# Patient Record
Sex: Female | Born: 1935 | Race: White | Hispanic: No | Smoking: Former smoker
Health system: Southern US, Community
[De-identification: ages and names within clinical notes are randomized; demographics above are authoritative.]

## PROBLEM LIST (undated history)

## (undated) DIAGNOSIS — F419 Anxiety disorder, unspecified: Secondary | ICD-10-CM

## (undated) DIAGNOSIS — F32A Depression, unspecified: Secondary | ICD-10-CM

## (undated) DIAGNOSIS — J449 Chronic obstructive pulmonary disease, unspecified: Secondary | ICD-10-CM

## (undated) DIAGNOSIS — E7800 Pure hypercholesterolemia, unspecified: Secondary | ICD-10-CM

## (undated) DIAGNOSIS — H8110 Benign paroxysmal vertigo, unspecified ear: Secondary | ICD-10-CM

## (undated) DIAGNOSIS — I6522 Occlusion and stenosis of left carotid artery: Secondary | ICD-10-CM

## (undated) DIAGNOSIS — G4489 Other headache syndrome: Secondary | ICD-10-CM

## (undated) DIAGNOSIS — E079 Disorder of thyroid, unspecified: Secondary | ICD-10-CM

## (undated) DIAGNOSIS — E559 Vitamin D deficiency, unspecified: Secondary | ICD-10-CM

## (undated) DIAGNOSIS — F329 Major depressive disorder, single episode, unspecified: Secondary | ICD-10-CM

## (undated) DIAGNOSIS — E039 Hypothyroidism, unspecified: Secondary | ICD-10-CM

## (undated) DIAGNOSIS — K219 Gastro-esophageal reflux disease without esophagitis: Secondary | ICD-10-CM

## (undated) DIAGNOSIS — R251 Tremor, unspecified: Secondary | ICD-10-CM

## (undated) DIAGNOSIS — C801 Malignant (primary) neoplasm, unspecified: Secondary | ICD-10-CM

## (undated) DIAGNOSIS — M069 Rheumatoid arthritis, unspecified: Secondary | ICD-10-CM

## (undated) DIAGNOSIS — J189 Pneumonia, unspecified organism: Secondary | ICD-10-CM

## (undated) DIAGNOSIS — N814 Uterovaginal prolapse, unspecified: Secondary | ICD-10-CM

## (undated) DIAGNOSIS — R413 Other amnesia: Secondary | ICD-10-CM

## (undated) DIAGNOSIS — R7303 Prediabetes: Secondary | ICD-10-CM

## (undated) DIAGNOSIS — R0600 Dyspnea, unspecified: Secondary | ICD-10-CM

## (undated) DIAGNOSIS — M1990 Unspecified osteoarthritis, unspecified site: Secondary | ICD-10-CM

## (undated) HISTORY — DX: Other headache syndrome: G44.89

## (undated) HISTORY — PX: EYE SURGERY: SHX253

## (undated) HISTORY — DX: Chronic obstructive pulmonary disease, unspecified: J44.9

## (undated) HISTORY — PX: APPENDECTOMY: SHX54

## (undated) HISTORY — PX: HEMORRHOID SURGERY: SHX153

## (undated) HISTORY — DX: Benign paroxysmal vertigo, unspecified ear: H81.10

## (undated) HISTORY — PX: OTHER SURGICAL HISTORY: SHX169

## (undated) HISTORY — DX: Vitamin D deficiency, unspecified: E55.9

## (undated) HISTORY — DX: Rheumatoid arthritis, unspecified: M06.9

## (undated) HISTORY — DX: Uterovaginal prolapse, unspecified: N81.4

## (undated) HISTORY — DX: Dyspnea, unspecified: R06.00

## (undated) HISTORY — DX: Other amnesia: R41.3

## (undated) HISTORY — PX: COLONOSCOPY W/ POLYPECTOMY: SHX1380

## (undated) HISTORY — PX: TONSILLECTOMY AND ADENOIDECTOMY: SHX28

## (undated) HISTORY — DX: Tremor, unspecified: R25.1

## (undated) HISTORY — DX: Occlusion and stenosis of left carotid artery: I65.22

## (undated) HISTORY — PX: FOOT SURGERY: SHX648

---

## 1999-01-03 ENCOUNTER — Encounter: Payer: Self-pay | Admitting: Family Medicine

## 1999-01-03 ENCOUNTER — Ambulatory Visit (HOSPITAL_COMMUNITY): Admission: RE | Admit: 1999-01-03 | Discharge: 1999-01-03 | Payer: Self-pay | Admitting: Family Medicine

## 1999-04-16 ENCOUNTER — Encounter: Payer: Self-pay | Admitting: Family Medicine

## 1999-04-16 ENCOUNTER — Ambulatory Visit (HOSPITAL_COMMUNITY): Admission: RE | Admit: 1999-04-16 | Discharge: 1999-04-16 | Payer: Self-pay | Admitting: Family Medicine

## 1999-12-05 ENCOUNTER — Other Ambulatory Visit: Admission: RE | Admit: 1999-12-05 | Discharge: 1999-12-05 | Payer: Self-pay | Admitting: Family Medicine

## 2000-02-13 ENCOUNTER — Encounter: Admission: RE | Admit: 2000-02-13 | Discharge: 2000-02-13 | Payer: Self-pay | Admitting: Gastroenterology

## 2000-02-13 ENCOUNTER — Encounter: Payer: Self-pay | Admitting: Gastroenterology

## 2000-05-21 ENCOUNTER — Encounter: Admission: RE | Admit: 2000-05-21 | Discharge: 2000-05-21 | Payer: Self-pay

## 2000-12-09 ENCOUNTER — Other Ambulatory Visit: Admission: RE | Admit: 2000-12-09 | Discharge: 2000-12-09 | Payer: Self-pay | Admitting: Internal Medicine

## 2001-02-24 ENCOUNTER — Encounter: Admission: RE | Admit: 2001-02-24 | Discharge: 2001-02-24 | Payer: Self-pay | Admitting: Rheumatology

## 2001-02-24 ENCOUNTER — Encounter: Payer: Self-pay | Admitting: Rheumatology

## 2002-08-09 ENCOUNTER — Encounter: Payer: Self-pay | Admitting: Family Medicine

## 2002-08-09 ENCOUNTER — Ambulatory Visit (HOSPITAL_COMMUNITY): Admission: RE | Admit: 2002-08-09 | Discharge: 2002-08-09 | Payer: Self-pay | Admitting: Family Medicine

## 2002-10-18 ENCOUNTER — Other Ambulatory Visit: Admission: RE | Admit: 2002-10-18 | Discharge: 2002-10-18 | Payer: Self-pay | Admitting: Gynecology

## 2004-01-11 ENCOUNTER — Ambulatory Visit (HOSPITAL_COMMUNITY): Admission: RE | Admit: 2004-01-11 | Discharge: 2004-01-11 | Payer: Self-pay | Admitting: Family Medicine

## 2004-01-14 ENCOUNTER — Other Ambulatory Visit: Admission: RE | Admit: 2004-01-14 | Discharge: 2004-01-14 | Payer: Self-pay | Admitting: Gynecology

## 2004-10-09 ENCOUNTER — Encounter: Admission: RE | Admit: 2004-10-09 | Discharge: 2004-10-09 | Payer: Self-pay | Admitting: Rheumatology

## 2004-10-21 ENCOUNTER — Ambulatory Visit: Admission: RE | Admit: 2004-10-21 | Discharge: 2004-10-21 | Payer: Self-pay | Admitting: Internal Medicine

## 2004-10-21 ENCOUNTER — Encounter (INDEPENDENT_AMBULATORY_CARE_PROVIDER_SITE_OTHER): Payer: Self-pay | Admitting: Specialist

## 2004-10-21 ENCOUNTER — Encounter (INDEPENDENT_AMBULATORY_CARE_PROVIDER_SITE_OTHER): Payer: Self-pay | Admitting: *Deleted

## 2004-10-28 HISTORY — PX: OTHER SURGICAL HISTORY: SHX169

## 2004-10-29 ENCOUNTER — Ambulatory Visit: Payer: Self-pay | Admitting: Thoracic Surgery

## 2004-10-30 ENCOUNTER — Ambulatory Visit (HOSPITAL_COMMUNITY): Admission: RE | Admit: 2004-10-30 | Discharge: 2004-10-30 | Payer: Self-pay | Admitting: Thoracic Surgery

## 2004-11-05 ENCOUNTER — Ambulatory Visit (HOSPITAL_COMMUNITY): Admission: RE | Admit: 2004-11-05 | Discharge: 2004-11-05 | Payer: Self-pay | Admitting: Thoracic Surgery

## 2004-11-10 ENCOUNTER — Inpatient Hospital Stay (HOSPITAL_COMMUNITY): Admission: RE | Admit: 2004-11-10 | Discharge: 2004-11-18 | Payer: Self-pay | Admitting: Thoracic Surgery

## 2004-11-10 ENCOUNTER — Ambulatory Visit: Payer: Self-pay | Admitting: Pulmonary Disease

## 2004-11-10 ENCOUNTER — Encounter (INDEPENDENT_AMBULATORY_CARE_PROVIDER_SITE_OTHER): Payer: Self-pay | Admitting: *Deleted

## 2004-11-12 ENCOUNTER — Ambulatory Visit: Payer: Self-pay | Admitting: Internal Medicine

## 2004-11-22 ENCOUNTER — Emergency Department (HOSPITAL_COMMUNITY): Admission: EM | Admit: 2004-11-22 | Discharge: 2004-11-22 | Payer: Self-pay | Admitting: Emergency Medicine

## 2004-11-26 ENCOUNTER — Encounter: Admission: RE | Admit: 2004-11-26 | Discharge: 2004-11-26 | Payer: Self-pay | Admitting: Thoracic Surgery

## 2004-12-10 ENCOUNTER — Encounter: Admission: RE | Admit: 2004-12-10 | Discharge: 2004-12-10 | Payer: Self-pay | Admitting: Thoracic Surgery

## 2005-01-01 ENCOUNTER — Ambulatory Visit: Payer: Self-pay | Admitting: Internal Medicine

## 2005-01-07 ENCOUNTER — Encounter: Admission: RE | Admit: 2005-01-07 | Discharge: 2005-01-07 | Payer: Self-pay | Admitting: Thoracic Surgery

## 2005-02-03 ENCOUNTER — Ambulatory Visit (HOSPITAL_COMMUNITY): Admission: RE | Admit: 2005-02-03 | Discharge: 2005-02-03 | Payer: Self-pay | Admitting: Internal Medicine

## 2005-02-12 ENCOUNTER — Ambulatory Visit: Payer: Self-pay | Admitting: Internal Medicine

## 2005-02-13 ENCOUNTER — Ambulatory Visit (HOSPITAL_COMMUNITY): Admission: RE | Admit: 2005-02-13 | Discharge: 2005-02-13 | Payer: Self-pay | Admitting: Internal Medicine

## 2005-03-03 ENCOUNTER — Ambulatory Visit (HOSPITAL_COMMUNITY): Admission: RE | Admit: 2005-03-03 | Discharge: 2005-03-03 | Payer: Self-pay | Admitting: Internal Medicine

## 2005-03-09 ENCOUNTER — Ambulatory Visit: Payer: Self-pay | Admitting: Internal Medicine

## 2005-03-11 ENCOUNTER — Encounter: Admission: RE | Admit: 2005-03-11 | Discharge: 2005-03-11 | Payer: Self-pay | Admitting: Thoracic Surgery

## 2005-03-24 ENCOUNTER — Ambulatory Visit: Payer: Self-pay | Admitting: Internal Medicine

## 2005-04-02 ENCOUNTER — Encounter: Admission: RE | Admit: 2005-04-02 | Discharge: 2005-04-02 | Payer: Self-pay | Admitting: Gastroenterology

## 2005-04-16 ENCOUNTER — Ambulatory Visit (HOSPITAL_COMMUNITY): Admission: RE | Admit: 2005-04-16 | Discharge: 2005-04-16 | Payer: Self-pay | Admitting: Internal Medicine

## 2005-04-22 ENCOUNTER — Encounter: Admission: RE | Admit: 2005-04-22 | Discharge: 2005-04-22 | Payer: Self-pay | Admitting: Gastroenterology

## 2005-05-12 ENCOUNTER — Other Ambulatory Visit: Admission: RE | Admit: 2005-05-12 | Discharge: 2005-05-12 | Payer: Self-pay | Admitting: Gynecology

## 2005-06-10 ENCOUNTER — Encounter: Admission: RE | Admit: 2005-06-10 | Discharge: 2005-06-10 | Payer: Self-pay | Admitting: Thoracic Surgery

## 2005-07-21 ENCOUNTER — Ambulatory Visit: Payer: Self-pay | Admitting: Internal Medicine

## 2005-07-24 ENCOUNTER — Ambulatory Visit (HOSPITAL_COMMUNITY): Admission: RE | Admit: 2005-07-24 | Discharge: 2005-07-24 | Payer: Self-pay | Admitting: Internal Medicine

## 2005-08-17 ENCOUNTER — Encounter: Admission: RE | Admit: 2005-08-17 | Discharge: 2005-08-17 | Payer: Self-pay | Admitting: Rheumatology

## 2005-10-13 ENCOUNTER — Encounter: Admission: RE | Admit: 2005-10-13 | Discharge: 2005-10-13 | Payer: Self-pay | Admitting: Thoracic Surgery

## 2005-11-12 ENCOUNTER — Ambulatory Visit: Payer: Self-pay | Admitting: Internal Medicine

## 2005-11-16 ENCOUNTER — Ambulatory Visit (HOSPITAL_COMMUNITY): Admission: RE | Admit: 2005-11-16 | Discharge: 2005-11-16 | Payer: Self-pay | Admitting: Internal Medicine

## 2005-12-15 ENCOUNTER — Ambulatory Visit: Payer: Self-pay | Admitting: Internal Medicine

## 2006-01-19 ENCOUNTER — Ambulatory Visit: Payer: Self-pay | Admitting: Internal Medicine

## 2006-01-19 ENCOUNTER — Ambulatory Visit (HOSPITAL_COMMUNITY): Admission: RE | Admit: 2006-01-19 | Discharge: 2006-01-19 | Payer: Self-pay | Admitting: Internal Medicine

## 2006-02-19 ENCOUNTER — Ambulatory Visit (HOSPITAL_COMMUNITY): Admission: RE | Admit: 2006-02-19 | Discharge: 2006-02-19 | Payer: Self-pay | Admitting: Internal Medicine

## 2006-03-05 ENCOUNTER — Ambulatory Visit: Payer: Self-pay | Admitting: Internal Medicine

## 2006-03-05 LAB — PULMONARY FUNCTION TEST

## 2006-04-14 ENCOUNTER — Encounter: Admission: RE | Admit: 2006-04-14 | Discharge: 2006-04-14 | Payer: Self-pay | Admitting: Thoracic Surgery

## 2006-06-22 ENCOUNTER — Ambulatory Visit: Payer: Self-pay | Admitting: Internal Medicine

## 2006-08-12 ENCOUNTER — Ambulatory Visit: Payer: Self-pay | Admitting: Internal Medicine

## 2006-08-17 LAB — CBC WITH DIFFERENTIAL/PLATELET
BASO%: 1.1 % (ref 0.0–2.0)
Basophils Absolute: 0.1 10*3/uL (ref 0.0–0.1)
EOS%: 1.5 % (ref 0.0–7.0)
Eosinophils Absolute: 0.1 10*3/uL (ref 0.0–0.5)
HCT: 34.9 % (ref 34.8–46.6)
HGB: 12 g/dL (ref 11.6–15.9)
LYMPH%: 55 % — ABNORMAL HIGH (ref 14.0–48.0)
MCH: 33.8 pg (ref 26.0–34.0)
MCHC: 34.4 g/dL (ref 32.0–36.0)
MCV: 98.4 fL (ref 81.0–101.0)
MONO#: 0.5 10*3/uL (ref 0.1–0.9)
MONO%: 7.5 % (ref 0.0–13.0)
NEUT#: 2.5 10*3/uL (ref 1.5–6.5)
NEUT%: 34.9 % — ABNORMAL LOW (ref 39.6–76.8)
Platelets: 264 10*3/uL (ref 145–400)
RBC: 3.55 10*6/uL — ABNORMAL LOW (ref 3.70–5.32)
RDW: 14.1 % (ref 11.3–14.5)
WBC: 7.2 10*3/uL (ref 3.9–10.0)
lymph#: 4 10*3/uL — ABNORMAL HIGH (ref 0.9–3.3)

## 2006-08-17 LAB — COMPREHENSIVE METABOLIC PANEL
ALT: 11 U/L (ref 0–40)
AST: 19 U/L (ref 0–37)
Albumin: 3.9 g/dL (ref 3.5–5.2)
Alkaline Phosphatase: 78 U/L (ref 39–117)
BUN: 11 mg/dL (ref 6–23)
CO2: 26 mEq/L (ref 19–32)
Calcium: 9.1 mg/dL (ref 8.4–10.5)
Chloride: 103 mEq/L (ref 96–112)
Creatinine, Ser: 0.87 mg/dL (ref 0.40–1.20)
Glucose, Bld: 85 mg/dL (ref 70–99)
Potassium: 3.7 mEq/L (ref 3.5–5.3)
Sodium: 141 mEq/L (ref 135–145)
Total Bilirubin: 0.4 mg/dL (ref 0.3–1.2)
Total Protein: 7.2 g/dL (ref 6.0–8.3)

## 2006-08-19 ENCOUNTER — Ambulatory Visit (HOSPITAL_COMMUNITY): Admission: RE | Admit: 2006-08-19 | Discharge: 2006-08-19 | Payer: Self-pay | Admitting: Internal Medicine

## 2006-10-13 ENCOUNTER — Encounter: Admission: RE | Admit: 2006-10-13 | Discharge: 2006-10-13 | Payer: Self-pay | Admitting: Thoracic Surgery

## 2006-12-01 ENCOUNTER — Encounter: Admission: RE | Admit: 2006-12-01 | Discharge: 2006-12-01 | Payer: Self-pay | Admitting: Rheumatology

## 2006-12-30 ENCOUNTER — Other Ambulatory Visit: Admission: RE | Admit: 2006-12-30 | Discharge: 2006-12-30 | Payer: Self-pay | Admitting: Gynecology

## 2007-01-27 ENCOUNTER — Encounter: Admission: RE | Admit: 2007-01-27 | Discharge: 2007-01-27 | Payer: Self-pay | Admitting: Rheumatology

## 2007-02-11 ENCOUNTER — Ambulatory Visit: Payer: Self-pay | Admitting: Internal Medicine

## 2007-02-16 LAB — CBC WITH DIFFERENTIAL/PLATELET
BASO%: 0.8 % (ref 0.0–2.0)
Basophils Absolute: 0.1 10*3/uL (ref 0.0–0.1)
EOS%: 1.7 % (ref 0.0–7.0)
Eosinophils Absolute: 0.1 10*3/uL (ref 0.0–0.5)
HCT: 35.9 % (ref 34.8–46.6)
HGB: 12.4 g/dL (ref 11.6–15.9)
LYMPH%: 44.9 % (ref 14.0–48.0)
MCH: 33.8 pg (ref 26.0–34.0)
MCHC: 34.5 g/dL (ref 32.0–36.0)
MCV: 98.1 fL (ref 81.0–101.0)
MONO#: 0.7 10*3/uL (ref 0.1–0.9)
MONO%: 8.6 % (ref 0.0–13.0)
NEUT#: 3.6 10*3/uL (ref 1.5–6.5)
NEUT%: 44 % (ref 39.6–76.8)
Platelets: 272 10*3/uL (ref 145–400)
RBC: 3.66 10*6/uL — ABNORMAL LOW (ref 3.70–5.32)
RDW: 15.5 % — ABNORMAL HIGH (ref 11.3–14.5)
WBC: 8.1 10*3/uL (ref 3.9–10.0)
lymph#: 3.6 10*3/uL — ABNORMAL HIGH (ref 0.9–3.3)

## 2007-02-16 LAB — COMPREHENSIVE METABOLIC PANEL
ALT: 14 U/L (ref 0–35)
AST: 17 U/L (ref 0–37)
Albumin: 3.9 g/dL (ref 3.5–5.2)
Alkaline Phosphatase: 85 U/L (ref 39–117)
BUN: 11 mg/dL (ref 6–23)
CO2: 27 mEq/L (ref 19–32)
Calcium: 9 mg/dL (ref 8.4–10.5)
Chloride: 103 mEq/L (ref 96–112)
Creatinine, Ser: 0.98 mg/dL (ref 0.40–1.20)
Glucose, Bld: 100 mg/dL — ABNORMAL HIGH (ref 70–99)
Potassium: 3.8 mEq/L (ref 3.5–5.3)
Sodium: 139 mEq/L (ref 135–145)
Total Bilirubin: 0.4 mg/dL (ref 0.3–1.2)
Total Protein: 6.8 g/dL (ref 6.0–8.3)

## 2007-02-21 ENCOUNTER — Ambulatory Visit (HOSPITAL_COMMUNITY): Admission: RE | Admit: 2007-02-21 | Discharge: 2007-02-21 | Payer: Self-pay | Admitting: Internal Medicine

## 2007-05-16 ENCOUNTER — Ambulatory Visit: Payer: Self-pay | Admitting: Internal Medicine

## 2007-05-18 LAB — COMPREHENSIVE METABOLIC PANEL
ALT: 12 U/L (ref 0–35)
AST: 16 U/L (ref 0–37)
Albumin: 4.3 g/dL (ref 3.5–5.2)
Alkaline Phosphatase: 81 U/L (ref 39–117)
BUN: 13 mg/dL (ref 6–23)
CO2: 27 mEq/L (ref 19–32)
Calcium: 8.8 mg/dL (ref 8.4–10.5)
Chloride: 104 mEq/L (ref 96–112)
Creatinine, Ser: 1.01 mg/dL (ref 0.40–1.20)
Glucose, Bld: 100 mg/dL — ABNORMAL HIGH (ref 70–99)
Potassium: 3.8 mEq/L (ref 3.5–5.3)
Sodium: 139 mEq/L (ref 135–145)
Total Bilirubin: 0.4 mg/dL (ref 0.3–1.2)
Total Protein: 7.4 g/dL (ref 6.0–8.3)

## 2007-05-18 LAB — CBC WITH DIFFERENTIAL/PLATELET
BASO%: 1 % (ref 0.0–2.0)
Basophils Absolute: 0.1 10*3/uL (ref 0.0–0.1)
EOS%: 1.5 % (ref 0.0–7.0)
Eosinophils Absolute: 0.1 10*3/uL (ref 0.0–0.5)
HCT: 35.7 % (ref 34.8–46.6)
HGB: 12.5 g/dL (ref 11.6–15.9)
LYMPH%: 56.4 % — ABNORMAL HIGH (ref 14.0–48.0)
MCH: 33.4 pg (ref 26.0–34.0)
MCHC: 34.9 g/dL (ref 32.0–36.0)
MCV: 95.7 fL (ref 81.0–101.0)
MONO#: 0.7 10*3/uL (ref 0.1–0.9)
MONO%: 9.2 % (ref 0.0–13.0)
NEUT#: 2.5 10*3/uL (ref 1.5–6.5)
NEUT%: 31.9 % — ABNORMAL LOW (ref 39.6–76.8)
Platelets: 215 10*3/uL (ref 145–400)
RBC: 3.73 10*6/uL (ref 3.70–5.32)
RDW: 14.9 % — ABNORMAL HIGH (ref 11.3–14.5)
WBC: 7.7 10*3/uL (ref 3.9–10.0)
lymph#: 4.3 10*3/uL — ABNORMAL HIGH (ref 0.9–3.3)

## 2007-05-20 ENCOUNTER — Ambulatory Visit (HOSPITAL_COMMUNITY): Admission: RE | Admit: 2007-05-20 | Discharge: 2007-05-20 | Payer: Self-pay | Admitting: Internal Medicine

## 2007-11-15 ENCOUNTER — Ambulatory Visit: Payer: Self-pay | Admitting: Internal Medicine

## 2007-11-17 LAB — CBC WITH DIFFERENTIAL/PLATELET
BASO%: 1.5 % (ref 0.0–2.0)
Basophils Absolute: 0.1 10*3/uL (ref 0.0–0.1)
EOS%: 1.6 % (ref 0.0–7.0)
Eosinophils Absolute: 0.1 10*3/uL (ref 0.0–0.5)
HCT: 36.7 % (ref 34.8–46.6)
HGB: 12.9 g/dL (ref 11.6–15.9)
LYMPH%: 55.4 % — ABNORMAL HIGH (ref 14.0–48.0)
MCH: 33.7 pg (ref 26.0–34.0)
MCHC: 35.2 g/dL (ref 32.0–36.0)
MCV: 95.6 fL (ref 81.0–101.0)
MONO#: 0.8 10*3/uL (ref 0.1–0.9)
MONO%: 11.3 % (ref 0.0–13.0)
NEUT#: 2 10*3/uL (ref 1.5–6.5)
NEUT%: 30.2 % — ABNORMAL LOW (ref 39.6–76.8)
Platelets: 257 10*3/uL (ref 145–400)
RBC: 3.84 10*6/uL (ref 3.70–5.32)
RDW: 14.3 % (ref 11.3–14.5)
WBC: 6.7 10*3/uL (ref 3.9–10.0)
lymph#: 3.7 10*3/uL — ABNORMAL HIGH (ref 0.9–3.3)

## 2007-11-17 LAB — COMPREHENSIVE METABOLIC PANEL
ALT: 8 U/L (ref 0–35)
AST: 16 U/L (ref 0–37)
Albumin: 3.9 g/dL (ref 3.5–5.2)
Alkaline Phosphatase: 82 U/L (ref 39–117)
BUN: 12 mg/dL (ref 6–23)
CO2: 25 mEq/L (ref 19–32)
Calcium: 9.1 mg/dL (ref 8.4–10.5)
Chloride: 103 mEq/L (ref 96–112)
Creatinine, Ser: 0.91 mg/dL (ref 0.40–1.20)
Glucose, Bld: 102 mg/dL — ABNORMAL HIGH (ref 70–99)
Potassium: 3.9 mEq/L (ref 3.5–5.3)
Sodium: 139 mEq/L (ref 135–145)
Total Bilirubin: 0.5 mg/dL (ref 0.3–1.2)
Total Protein: 7.2 g/dL (ref 6.0–8.3)

## 2007-11-18 ENCOUNTER — Ambulatory Visit (HOSPITAL_COMMUNITY): Admission: RE | Admit: 2007-11-18 | Discharge: 2007-11-18 | Payer: Self-pay | Admitting: Internal Medicine

## 2008-07-09 ENCOUNTER — Ambulatory Visit (HOSPITAL_COMMUNITY): Admission: RE | Admit: 2008-07-09 | Discharge: 2008-07-09 | Payer: Self-pay | Admitting: Internal Medicine

## 2008-07-09 ENCOUNTER — Ambulatory Visit: Payer: Self-pay | Admitting: Internal Medicine

## 2008-07-09 LAB — CBC WITH DIFFERENTIAL/PLATELET
BASO%: 0.7 % (ref 0.0–2.0)
Basophils Absolute: 0 10*3/uL (ref 0.0–0.1)
EOS%: 2.5 % (ref 0.0–7.0)
Eosinophils Absolute: 0.2 10*3/uL (ref 0.0–0.5)
HCT: 38.7 % (ref 34.8–46.6)
HGB: 13.1 g/dL (ref 11.6–15.9)
LYMPH%: 34.2 % (ref 14.0–48.0)
MCH: 33.3 pg (ref 26.0–34.0)
MCHC: 34 g/dL (ref 32.0–36.0)
MCV: 98 fL (ref 81.0–101.0)
MONO#: 0.7 10*3/uL (ref 0.1–0.9)
MONO%: 11.6 % (ref 0.0–13.0)
NEUT#: 3.2 10*3/uL (ref 1.5–6.5)
NEUT%: 51 % (ref 39.6–76.8)
Platelets: 292 10*3/uL (ref 145–400)
RBC: 3.94 10*6/uL (ref 3.70–5.32)
RDW: 15 % — ABNORMAL HIGH (ref 11.3–14.5)
WBC: 6.3 10*3/uL (ref 3.9–10.0)
lymph#: 2.1 10*3/uL (ref 0.9–3.3)

## 2008-07-09 LAB — COMPREHENSIVE METABOLIC PANEL
ALT: 13 U/L (ref 0–35)
AST: 19 U/L (ref 0–37)
Albumin: 3.6 g/dL (ref 3.5–5.2)
Alkaline Phosphatase: 76 U/L (ref 39–117)
BUN: 8 mg/dL (ref 6–23)
CO2: 34 mEq/L — ABNORMAL HIGH (ref 19–32)
Calcium: 9.2 mg/dL (ref 8.4–10.5)
Chloride: 101 mEq/L (ref 96–112)
Creatinine, Ser: 0.84 mg/dL (ref 0.40–1.20)
Glucose, Bld: 128 mg/dL — ABNORMAL HIGH (ref 70–99)
Potassium: 3.7 mEq/L (ref 3.5–5.3)
Sodium: 136 mEq/L (ref 135–145)
Total Bilirubin: 0.6 mg/dL (ref 0.3–1.2)
Total Protein: 7.6 g/dL (ref 6.0–8.3)

## 2008-08-01 ENCOUNTER — Ambulatory Visit (HOSPITAL_COMMUNITY): Admission: RE | Admit: 2008-08-01 | Discharge: 2008-08-01 | Payer: Self-pay | Admitting: Family Medicine

## 2009-01-01 ENCOUNTER — Ambulatory Visit: Payer: Self-pay | Admitting: Internal Medicine

## 2009-01-03 LAB — COMPREHENSIVE METABOLIC PANEL
ALT: 15 U/L (ref 0–35)
AST: 20 U/L (ref 0–37)
Albumin: 3.6 g/dL (ref 3.5–5.2)
Alkaline Phosphatase: 67 U/L (ref 39–117)
BUN: 11 mg/dL (ref 6–23)
CO2: 29 mEq/L (ref 19–32)
Calcium: 8.8 mg/dL (ref 8.4–10.5)
Chloride: 105 mEq/L (ref 96–112)
Creatinine, Ser: 0.9 mg/dL (ref 0.40–1.20)
Glucose, Bld: 94 mg/dL (ref 70–99)
Potassium: 3.9 mEq/L (ref 3.5–5.3)
Sodium: 138 mEq/L (ref 135–145)
Total Bilirubin: 0.8 mg/dL (ref 0.3–1.2)
Total Protein: 7.1 g/dL (ref 6.0–8.3)

## 2009-01-03 LAB — CBC WITH DIFFERENTIAL/PLATELET
BASO%: 0.7 % (ref 0.0–2.0)
Basophils Absolute: 0 10*3/uL (ref 0.0–0.1)
EOS%: 1.3 % (ref 0.0–7.0)
Eosinophils Absolute: 0.1 10*3/uL (ref 0.0–0.5)
HCT: 37.9 % (ref 34.8–46.6)
HGB: 12.9 g/dL (ref 11.6–15.9)
LYMPH%: 51.9 % — ABNORMAL HIGH (ref 14.0–48.0)
MCH: 33.4 pg (ref 26.0–34.0)
MCHC: 34 g/dL (ref 32.0–36.0)
MCV: 98.3 fL (ref 81.0–101.0)
MONO#: 0.7 10*3/uL (ref 0.1–0.9)
MONO%: 10 % (ref 0.0–13.0)
NEUT#: 2.4 10*3/uL (ref 1.5–6.5)
NEUT%: 36.1 % — ABNORMAL LOW (ref 39.6–76.8)
Platelets: 225 10*3/uL (ref 145–400)
RBC: 3.85 10*6/uL (ref 3.70–5.32)
RDW: 15.4 % — ABNORMAL HIGH (ref 11.3–14.5)
WBC: 6.7 10*3/uL (ref 3.9–10.0)
lymph#: 3.5 10*3/uL — ABNORMAL HIGH (ref 0.9–3.3)

## 2009-01-07 ENCOUNTER — Ambulatory Visit (HOSPITAL_COMMUNITY): Admission: RE | Admit: 2009-01-07 | Discharge: 2009-01-07 | Payer: Self-pay | Admitting: Internal Medicine

## 2009-07-16 ENCOUNTER — Ambulatory Visit: Payer: Self-pay | Admitting: Internal Medicine

## 2009-07-18 LAB — COMPREHENSIVE METABOLIC PANEL
ALT: 12 U/L (ref 0–35)
AST: 17 U/L (ref 0–37)
Albumin: 3.9 g/dL (ref 3.5–5.2)
Alkaline Phosphatase: 71 U/L (ref 39–117)
BUN: 10 mg/dL (ref 6–23)
CO2: 25 mEq/L (ref 19–32)
Calcium: 9 mg/dL (ref 8.4–10.5)
Chloride: 105 mEq/L (ref 96–112)
Creatinine, Ser: 0.88 mg/dL (ref 0.40–1.20)
Glucose, Bld: 106 mg/dL — ABNORMAL HIGH (ref 70–99)
Potassium: 4.2 mEq/L (ref 3.5–5.3)
Sodium: 138 mEq/L (ref 135–145)
Total Bilirubin: 0.4 mg/dL (ref 0.3–1.2)
Total Protein: 7.3 g/dL (ref 6.0–8.3)

## 2009-07-18 LAB — CBC WITH DIFFERENTIAL/PLATELET
BASO%: 0.8 % (ref 0.0–2.0)
Basophils Absolute: 0.1 10*3/uL (ref 0.0–0.1)
EOS%: 1.8 % (ref 0.0–7.0)
Eosinophils Absolute: 0.1 10*3/uL (ref 0.0–0.5)
HCT: 36.8 % (ref 34.8–46.6)
HGB: 12.5 g/dL (ref 11.6–15.9)
LYMPH%: 45.2 % (ref 14.0–49.7)
MCH: 33.4 pg (ref 25.1–34.0)
MCHC: 34 g/dL (ref 31.5–36.0)
MCV: 98.3 fL (ref 79.5–101.0)
MONO#: 0.6 10*3/uL (ref 0.1–0.9)
MONO%: 9.8 % (ref 0.0–14.0)
NEUT#: 2.7 10*3/uL (ref 1.5–6.5)
NEUT%: 42.4 % (ref 38.4–76.8)
Platelets: 187 10*3/uL (ref 145–400)
RBC: 3.75 10*6/uL (ref 3.70–5.45)
RDW: 14.9 % — ABNORMAL HIGH (ref 11.2–14.5)
WBC: 6.4 10*3/uL (ref 3.9–10.3)
lymph#: 2.9 10*3/uL (ref 0.9–3.3)

## 2009-07-22 ENCOUNTER — Ambulatory Visit (HOSPITAL_COMMUNITY): Admission: RE | Admit: 2009-07-22 | Discharge: 2009-07-22 | Payer: Self-pay | Admitting: Internal Medicine

## 2009-11-14 ENCOUNTER — Ambulatory Visit (HOSPITAL_COMMUNITY): Admission: RE | Admit: 2009-11-14 | Discharge: 2009-11-14 | Payer: Self-pay | Admitting: Ophthalmology

## 2009-12-05 ENCOUNTER — Ambulatory Visit (HOSPITAL_COMMUNITY): Admission: RE | Admit: 2009-12-05 | Discharge: 2009-12-05 | Payer: Self-pay | Admitting: Ophthalmology

## 2010-04-04 ENCOUNTER — Encounter: Admission: RE | Admit: 2010-04-04 | Discharge: 2010-04-04 | Payer: Self-pay | Admitting: Family Medicine

## 2010-07-15 ENCOUNTER — Ambulatory Visit: Payer: Self-pay | Admitting: Internal Medicine

## 2010-07-17 ENCOUNTER — Ambulatory Visit (HOSPITAL_COMMUNITY): Admission: RE | Admit: 2010-07-17 | Discharge: 2010-07-17 | Payer: Self-pay | Admitting: Internal Medicine

## 2010-07-17 LAB — COMPREHENSIVE METABOLIC PANEL
ALT: 15 U/L (ref 0–35)
AST: 26 U/L (ref 0–37)
Albumin: 3.7 g/dL (ref 3.5–5.2)
Alkaline Phosphatase: 61 U/L (ref 39–117)
BUN: 9 mg/dL (ref 6–23)
CO2: 30 mEq/L (ref 19–32)
Calcium: 9.4 mg/dL (ref 8.4–10.5)
Chloride: 104 mEq/L (ref 96–112)
Creatinine, Ser: 0.89 mg/dL (ref 0.40–1.20)
Glucose, Bld: 118 mg/dL — ABNORMAL HIGH (ref 70–99)
Potassium: 4.1 mEq/L (ref 3.5–5.3)
Sodium: 140 mEq/L (ref 135–145)
Total Bilirubin: 0.9 mg/dL (ref 0.3–1.2)
Total Protein: 7.4 g/dL (ref 6.0–8.3)

## 2010-07-17 LAB — CBC WITH DIFFERENTIAL/PLATELET
BASO%: 0.7 % (ref 0.0–2.0)
Basophils Absolute: 0 10*3/uL (ref 0.0–0.1)
EOS%: 3.2 % (ref 0.0–7.0)
Eosinophils Absolute: 0.2 10*3/uL (ref 0.0–0.5)
HCT: 40.5 % (ref 34.8–46.6)
HGB: 13.6 g/dL (ref 11.6–15.9)
LYMPH%: 47.1 % (ref 14.0–49.7)
MCH: 33.1 pg (ref 25.1–34.0)
MCHC: 33.7 g/dL (ref 31.5–36.0)
MCV: 98.2 fL (ref 79.5–101.0)
MONO#: 0.6 10*3/uL (ref 0.1–0.9)
MONO%: 11.8 % (ref 0.0–14.0)
NEUT#: 1.8 10*3/uL (ref 1.5–6.5)
NEUT%: 37.2 % — ABNORMAL LOW (ref 38.4–76.8)
Platelets: 233 10*3/uL (ref 145–400)
RBC: 4.13 10*6/uL (ref 3.70–5.45)
RDW: 16.1 % — ABNORMAL HIGH (ref 11.2–14.5)
WBC: 5 10*3/uL (ref 3.9–10.3)
lymph#: 2.3 10*3/uL (ref 0.9–3.3)

## 2010-12-17 ENCOUNTER — Telehealth: Payer: Self-pay | Admitting: Internal Medicine

## 2010-12-17 ENCOUNTER — Ambulatory Visit: Payer: Self-pay | Admitting: Internal Medicine

## 2010-12-17 DIAGNOSIS — R0600 Dyspnea, unspecified: Secondary | ICD-10-CM | POA: Insufficient documentation

## 2010-12-17 DIAGNOSIS — M069 Rheumatoid arthritis, unspecified: Secondary | ICD-10-CM | POA: Insufficient documentation

## 2010-12-17 DIAGNOSIS — R0609 Other forms of dyspnea: Secondary | ICD-10-CM | POA: Insufficient documentation

## 2010-12-17 DIAGNOSIS — C3490 Malignant neoplasm of unspecified part of unspecified bronchus or lung: Secondary | ICD-10-CM | POA: Insufficient documentation

## 2011-01-16 ENCOUNTER — Ambulatory Visit
Admission: RE | Admit: 2011-01-16 | Discharge: 2011-01-16 | Payer: Self-pay | Source: Home / Self Care | Attending: Internal Medicine | Admitting: Internal Medicine

## 2011-01-16 DIAGNOSIS — J449 Chronic obstructive pulmonary disease, unspecified: Secondary | ICD-10-CM | POA: Insufficient documentation

## 2011-01-17 ENCOUNTER — Other Ambulatory Visit: Payer: Self-pay | Admitting: Internal Medicine

## 2011-01-17 DIAGNOSIS — C3490 Malignant neoplasm of unspecified part of unspecified bronchus or lung: Secondary | ICD-10-CM

## 2011-01-18 ENCOUNTER — Encounter: Payer: Self-pay | Admitting: Gastroenterology

## 2011-01-18 ENCOUNTER — Encounter: Payer: Self-pay | Admitting: Family Medicine

## 2011-01-18 ENCOUNTER — Encounter: Payer: Self-pay | Admitting: Internal Medicine

## 2011-01-29 NOTE — Progress Notes (Signed)
Summary: Call Report  Phone Note From Other Clinic   Caller: [facility] [phone] Call For: Dr. Sherene Sires Summary of Call: Call report on CXR.  Post op changes in right hemithorax with ? added nodular density at the apex of right hemithorax.  CT chest may be helpful.  Will fax report to triage.   Report placed on MW's desk and message forwarded to him.  Initial call taken by: Gweneth Dimitri RN,  December 17, 2010 11:23 AM  Follow-up for Phone Call        aware Follow-up by: Nyoka Cowden MD,  December 17, 2010 1:42 PM

## 2011-01-29 NOTE — Assessment & Plan Note (Signed)
Summary: Pulmonary/ new pt eval for sob asthma vs anxiety   Copy to:  Dr. Leonides Sake Primary Provider/Referring Provider:  Dr. Leonides Sake  CC:  Dyspnea.  History of Present Illness: 41 yowf quit smoking 2004 s/p RUL lobectomy 10/2004 for [facility]  with PFT's 2007 FEV1  72% with ratio 60  December 17, 2010   1st pulmonary office eval for worsening sob for about a year on symbicort" for sev days at a time" not maintaining on it  and generally not needing saba more than 2 x weekly but for the last 3 months having spells sob sometimes at rest, sometimes with simple  activity but does not occur sleeping or on treadmill (but did not do x last 3weeks) .  Reports proaire works better than symbicort 80 no cough, some nasal congestion but no change in severity from baseline.   Pt denies any significant sore throat, dysphagia, itching, sneezing,   excess or purulent nasal secretions,  fever, chills, sweats, unintended wt loss, pleuritic or exertional cp, hempoptysis, change in activity tolerance  orthopnea pnd or leg swelling Pt also denies any obvious fluctuation in symptoms with weather or environmental change or other alleviating or aggravating factors.     Pt denies any increase in rescue therapy over baseline, denies waking up needing it or having early am exacerbations of coughing/wheezing/ or dyspnea   Current Medications (verified): 1)  Methotrexate Sodium 25 Mg/ml Soln (Methotrexate Sodium) .Marland Kitchen.. 1 Inj Weekly 2)  Symbicort 80-4.5 Mcg/act Aero (Budesonide-Formoterol Fumarate) .... 2 Puffs Two Times A Day 3)  Proair Hfa 108 (90 Base) Mcg/act Aers (Albuterol Sulfate) .... 2 Puffs Every 4-6 Hrs As Needed 4)  Meclizine Hcl 12.5 Mg Tabs (Meclizine Hcl) .... As Needed 5)  Folic Acid 1 Mg Tabs (Folic Acid) .Marland Kitchen.. 1 Once Daily 6)  Diazepam 5 Mg Tabs (Diazepam) .... 1/2 To 1 Once Daily As Needed 7)  Synthroid 88 Mcg Tabs (Levothyroxine Sodium) .Marland Kitchen.. 1 Once Daily 8)  Voltaren 1 % Gel (Diclofenac  Sodium) .Marland Kitchen.. 1 Patch Twice Per Wk 9)  Vitamin D (Ergocalciferol) 50000 Unit Caps (Ergocalciferol) .Marland Kitchen.. 1 Twice Per Wk 10)  Zoloft 50 Mg Tabs (Sertraline Hcl) .... 1/2 Once Daily 11)  Nexium 40 Mg Cpdr (Esomeprazole Magnesium) .Marland Kitchen.. 1 Two Times A Day 12)  Clidinium-Chlordiazepoxide 2.5-5 Mg Caps (Clidinium-Chlordiazepoxide) .Marland Kitchen.. 1 Once Daily As Needed  Allergies (verified): 1)  ! Demerol 2)  ! Codeine  Past History:  Past Medical History: ARTHRITIS, RHEUMATOID (ICD-714.0) DYSPNEA (ICD-786.05) Hx of CARCINOMA, LUNG, NONSMALL CELL (ICD-162.9)       - s/p RLLobectomy COPD      -  HFA 0% effective  December 17, 2010   Past Surgical History: Right lobectomy November 2005 Appendectomy  Family History: Emphysema- Mother Lung CA- Father (was a smoker and worked Haematologist mines) Heart dz-3  Brothers and Sisters Asthma- Mother  Social History: Former smoker.  Quit in 2004.  Smoked 1 ppd x 20 yrs Separtated Children Lives alone Retired  Review of Systems       The patient complains of shortness of breath with activity, nasal congestion/difficulty breathing through nose, anxiety, depression, and joint stiffness or pain.  The patient denies shortness of breath at rest, productive cough, non-productive cough, coughing up blood, chest pain, irregular heartbeats, acid heartburn, indigestion, loss of appetite, weight change, abdominal pain, difficulty swallowing, sore throat, tooth/dental problems, headaches, sneezing, itching, ear ache, hand/feet swelling, rash, change in color of mucus, and fever.  Vital Signs:  Patient profile:   77 year old female Height:      65 inches Weight:      158 pounds BMI:     26.39 O2 Sat:      97 % on Room air Temp:     98.2 degrees F oral Pulse rate:   60 / minute BP sitting:   144 / 84  (left arm)  Vitals Entered By: Vernie Murders (December 17, 2010 9:01 AM)  O2 Flow:  Room air  Physical Exam  Additional Exam:  very anxious amb wf talking a mile a  minute nad wt 158 December 17, 2010  HEENT mild turbinate edema.  Oropharynx no thrush or excess pnd or cobblestoning.  No JVD or cervical adenopathy. Mild accessory muscle hypertrophy. Trachea midline, nl thryroid. Chest was hyperinflated by percussion with diminished breath sounds and moderate increased exp time without wheeze. Hoover sign positive at mid inspiration. Regular rate and rhythm without murmur gallop or rub or increase P2 or edema.  Abd: no hsm, nl excursion. Ext warm without cyanosis or clubbing.     CXR  Procedure date:  12/17/2010  Findings:      IMPRESSION: Postoperative changes in the right hemithorax with questionable added nodular density at the apex of the right hemithorax.  CT chest  may be helpful in further evaluation, as clinically indicated.    Impression & Recommendations:  Problem # 1:  DYSPNEA (ICD-786.05) When respiratory symptoms begin well after a patient reports complete smoking cessation,  it is very hard to "blame" COPD (although she does have mild airflow obstruction by pft's in 2007)  ie it doesn't make any more sense than hearing a  NASCAR driver wrecked his car while driving his kids to school or a Careers adviser sliced his hand off carving [place].  Once the high risk activity stops,  the symptoms should not suddenly erupt.  If so, the differential diagnosis should include  obesity/deconditioning,  LPR/Reflux, CHF, or side effect of medications or perhaps an asthmatic or anxiety component.  To rule out the fomer needs to learn optimal hfa technique or change over to advair dpi and start on gerd diet.  Since she perceive hfa helps will first try on dulera 100  2 puffs first thing  in am and 2 puffs again in pm about 12 hours later    I spent extra time with the patient today explaining optimal mdi  technique.  This improved from  0-50% but no better  See instructions for specific recommendations   Problem # 2:  Hx of CARCINOMA, LUNG, NONSMALL CELL  (ICD-162.9) Last ct scan reviewed from 07/17/10 evidence of met dz  Medications Added to Medication List This Visit: 1)  Methotrexate Sodium 25 Mg/ml Soln (Methotrexate sodium) .Marland Kitchen.. 1 inj weekly 2)  Symbicort 80-4.5 Mcg/act Aero (Budesonide-formoterol fumarate) .... 2 puffs two times a day 3)  Proair Hfa 108 (90 Base) Mcg/act Aers (Albuterol sulfate) .... 2 puffs every 4-6 hrs as needed 4)  Meclizine Hcl 12.5 Mg Tabs (Meclizine hcl) .... As needed 5)  Folic Acid 1 Mg Tabs (Folic acid) .Marland Kitchen.. 1 once daily 6)  Diazepam 5 Mg Tabs (Diazepam) .... 1/2 to 1 once daily as needed 7)  Synthroid 88 Mcg Tabs (Levothyroxine sodium) .Marland Kitchen.. 1 once daily 8)  Voltaren 1 % Gel (Diclofenac sodium) .Marland Kitchen.. 1 patch twice per wk 9)  Vitamin D (ergocalciferol) 50000 Unit Caps (Ergocalciferol) .Marland Kitchen.. 1 twice per wk 10)  Zoloft 50 Mg Tabs (  Sertraline hcl) .... 1/2 once daily 11)  Nexium 40 Mg Cpdr (Esomeprazole magnesium) .Marland Kitchen.. 1 two times a day 12)  Nexium 40 Mg Cpdr (Esomeprazole magnesium) .... Take  one 30-60 min before first meal of the day 13)  Clidinium-chlordiazepoxide 2.5-5 Mg Caps (Clidinium-chlordiazepoxide) .Marland Kitchen.. 1 once daily as needed 14)  Dulera 100-5 Mcg/act Aero (Mometasone furo-formoterol fum) .... 2 puffs first thing  in am and 2 puffs again in pm about 12 hours later  Other Orders: T-2 View CXR ([id number]) New Patient Level V (16109)  Patient Instructions: 1)  Dulera 100 2 puffs first thing  in am and 2 puffs again in pm about 12 hours later  2)  Work on inhaler technique:  relax and blow all the way out then take a nice smooth deep breath back in, triggering the inhaler at same time you start breathing in  3)  GERD (REFLUX)  is a common cause of respiratory symptoms. It commonly presents without heartburn and can be treated with medication, but also with lifestyle changes including avoidance of late meals, excessive alcohol, smoking cessation, and avoid fatty foods, chocolate, peppermint, colas, red wine,  and acidic juices such as orange juice. NO MINT OR MENTHOL PRODUCTS SO NO COUGH DROPS  4)  USE SUGARLESS CANDY INSTEAD (jolley ranchers)  5)  NO OIL BASED VITAMINS  6)  Please schedule a follow-up appointment in 4 weeks, sooner if needed

## 2011-01-29 NOTE — Assessment & Plan Note (Addendum)
Summary: Pulmonary/  f/u ov with hfa 50% p coaching   Copy to:  Dr. Leonides Sake Primary Provider/Referring Provider:  Dr. Leonides Sake  CC:  Dyspnea- the same.  History of Present Illness: 38 yowf quit smoking 2004 s/p RUL lobectomy 10/2004 for [facility] ca  with PFT's 2007 FEV1  72% with ratio 60  December 17, 2010   1st pulmonary office eval for worsening sob for about a year on symbicort" for sev days at a time" not maintaining on it  and generally not needing saba more than 2 x weekly but for the last 3 months having spells sob sometimes at rest, sometimes with simple  activity but does not occur sleeping or on treadmill (but did not do x last 3weeks) .  Reports proaire works better than symbicort 80 no cough, some nasal congestion but no change in severity from baseline.Imp was AB rx = Dulera 100 2 puffs first thing  in am and 2 puffs again in pm about 12 hours later  Work on inhaler technique:  relax and blow all the way out then take a nice smooth deep breath back in, triggering the inhaler at same time you start breathing in  GERD (REFLUX)  diet  January 16, 2011 ov cc sob but only 50% effective with hfa  and only used up 60 puffs off dulera so far by the counter   with  ultimate goal of walking 30 min at a time. no sign cough, no nocturnal exac. Pt denies any significant sore throat, dysphagia, itching, sneezing,  nasal congestion or excess secretions,  fever, chills, sweats, unintended wt loss, pleuritic or exertional cp, hempoptysis, variability in activity tolerance  orthopnea pnd or leg swelling     Current Medications (verified): 1)  Methotrexate Sodium 25 Mg/ml Soln (Methotrexate Sodium) .Marland Kitchen.. 1 Inj Weekly 2)  Proair Hfa 108 (90 Base) Mcg/act Aers (Albuterol Sulfate) .... 2 Puffs Every 4-6 Hrs As Needed 3)  Meclizine Hcl 12.5 Mg Tabs (Meclizine Hcl) .... As Needed 4)  Folic Acid 1 Mg Tabs (Folic Acid) .Marland Kitchen.. 1 Once Daily 5)  Diazepam 5 Mg Tabs (Diazepam) .... 1/2 To 1 Once Daily  As Needed 6)  Synthroid 88 Mcg Tabs (Levothyroxine Sodium) .Marland Kitchen.. 1 Once Daily 7)  Voltaren 1 % Gel (Diclofenac Sodium) .Marland Kitchen.. 1 Patch Twice Per Wk 8)  Zoloft 50 Mg Tabs (Sertraline Hcl) .... 1/2 Once Daily 9)  Nexium 40 Mg Cpdr (Esomeprazole Magnesium) .... Take  One 30-60 Min Before First Meal of The Day 10)  Dulera 100-5 Mcg/act Aero (Mometasone Furo-Formoterol Fum) .... 2 Puffs First Thing  in Am and 2 Puffs Again in Pm About 12 Hours Later-  Allergies (verified): 1)  ! Demerol 2)  ! Codeine  Past History:  Past Medical History: ARTHRITIS, RHEUMATOID (ICD-714.0) DYSPNEA (ICD-786.05) Hx of CARCINOMA, LUNG, NONSMALL CELL (ICD-162.9)       - s/p RLLobectomy COPD      -  HFA 0% effective  December 17, 2010 > 50% with coaching January 16, 2011   Vital Signs:  Patient profile:   77 year old female Weight:      162 pounds O2 Sat:      93 % on Room air Temp:     98.2 degrees F oral Pulse rate:   69 / minute BP sitting:   138 / 80  (left arm)  Vitals Entered By: Vernie Murders (January 16, 2011 10:35 AM)  O2 Flow:  Room air  Physical Exam  Additional Exam:   anxious amb wf nad wt 158 December 17, 2010  > 162 January 16, 2011  HEENT mild turbinate edema.  Oropharynx no thrush or excess pnd or cobblestoning.  No JVD or cervical adenopathy. Mild accessory muscle hypertrophy. Trachea midline, nl thryroid. Chest was hyperinflated by percussion with diminished breath sounds and moderate increased exp time without wheeze. Hoover sign positive at mid inspiration. Regular rate and rhythm without murmur gallop or rub or increase P2 or edema.  Abd: no hsm, nl excursion. Ext warm without cyanosis or clubbing.     Impression & Recommendations:  Problem # 1:  DYSPNEA (ICD-786.05)  Not sure if this is asthma vs copd but does seem to respond to saba  In this case Adherence is the biggest issue and starts with  inability to use HFA effectively and also  understand that SABA treats the symptoms  but doesn't get to the underlying problem (inflammation).  I used  the ananology of putting steroid cream on a rash to help explain the meaning of topical therapy and the need to get the drug to the target tissue.    I spent extra time with the patient today explaining optimal mdi  technique.  This improved from  25-50% p coaching.   Orders: Est. Patient Level III (09811)  Problem # 2:  Hx of CARCINOMA, LUNG, NONSMALL CELL (ICD-162.9) cxr reviewed, needs f/u cxr on return  Medications Added to Medication List This Visit: 1)  Dulera 100-5 Mcg/act Aero (Mometasone furo-formoterol fum) .... 2 puffs first thing  in am and 2 puffs again in pm about 12 hours later-  Other Orders: HFA Instruction [id number])  Patient Instructions: 1)  Continue dulera 100 2 puffs first thing  in am and 2 puffs again in pm about 12 hours later  2)  Work on inhaler technique:  relax and blow all the way out then take a nice smooth deep breath back in, triggering the inhaler at same time you start breathing in 3)  Please schedule a follow-up appointment in 4 weeks, sooner if needed with PFT's and cxr

## 2011-02-13 ENCOUNTER — Ambulatory Visit: Payer: Self-pay | Admitting: Internal Medicine

## 2011-02-16 ENCOUNTER — Telehealth: Payer: Self-pay | Admitting: Internal Medicine

## 2011-02-24 NOTE — Progress Notes (Addendum)
Summary: nos appt  Phone Note Call from Patient   Caller: [email or web address]  Call For: wert Summary of Call: In ref to nos from 2/17 pt states she doesn't want to reschedule. Initial call taken by: Darletta Moll,  February 16, 2011 3:48 PM     Appended Document: nos appt discussed with husband, needs f/u ov with cxr or have Dr Tiburcio Pea do one on f/u ov. Send copy of this note to Dr Leonides Sake  Appended Document: nos appt OV with MW sched for 02/25/11 at 9 am

## 2011-02-25 ENCOUNTER — Other Ambulatory Visit: Payer: Self-pay | Admitting: Internal Medicine

## 2011-02-25 ENCOUNTER — Ambulatory Visit (INDEPENDENT_AMBULATORY_CARE_PROVIDER_SITE_OTHER): Payer: Medicare Other | Admitting: Internal Medicine

## 2011-02-25 ENCOUNTER — Ambulatory Visit (INDEPENDENT_AMBULATORY_CARE_PROVIDER_SITE_OTHER)
Admission: RE | Admit: 2011-02-25 | Discharge: 2011-02-25 | Disposition: A | Discharge status: Home / Self Care | Payer: Medicare Other | Source: Ambulatory Visit | Attending: Internal Medicine | Admitting: Internal Medicine

## 2011-02-25 ENCOUNTER — Encounter: Payer: Self-pay | Admitting: Internal Medicine

## 2011-02-25 DIAGNOSIS — J45909 Unspecified asthma, uncomplicated: Secondary | ICD-10-CM

## 2011-02-25 DIAGNOSIS — C3490 Malignant neoplasm of unspecified part of unspecified bronchus or lung: Secondary | ICD-10-CM

## 2011-03-05 NOTE — Assessment & Plan Note (Signed)
Summary: Pulmonary/ ext f/u with hfa 75% p coaching   Copy to:  Dr. Leonides Sake Primary Provider/Referring Provider:  Dr. Leonides Sake   History of Present Illness: 78 yowf quit smoking 2004 s/p RUL lobectomy 10/2004 for [facility] Lung ca  with PFT's 2007 FEV1  72% with ratio 60  December 17, 2010   1st pulmonary office eval for worsening sob for about a year on symbicort" for sev days at a time" not maintaining on it  and generally not needing saba more than 2 x weekly but for the last 3 months having spells sob sometimes at rest, sometimes with simple  activity but does not occur sleeping or on treadmill (but did not do x last 3weeks) .  Reports proaire works better than symbicort 80 no cough, some nasal congestion but no change in severity from baseline.Imp was AB rx = Dulera 100 2 puffs first thing  in am and 2 puffs again in pm about 12 hours later  Work on inhaler technique:  relax and blow all the way out then take a nice smooth deep breath back in, triggering the inhaler at same time you start breathing in  GERD (REFLUX)  diet  January 16, 2011 ov cc sob but only 50% effective with hfa  and only used up 60 puffs off dulera so far by the counter   with  ultimate goal of walking 30 min at a time. no sign cough, no nocturnal exac. Continue dulera 100 2 puffs first thing  in am and 2 puffs again in pm about 12 hours later  Work on inhaler technique  February 25, 2011 ov did not tolerate dulera but doing ok on symbicort 80 2bid.  no chronic cough. acutely worse 2/24 with uri.   baseline = doe with fast walk, esp uphill. no purulent sputum. Pt denies any significant sore throat, nasal congestion or excess secretions, fever, chills, sweats, unintended wt loss, pleuritic or exertional cp, orthopnea pnd or leg swelling.  Pt also denies any obvious fluctuation in symptoms with weather or environmental change or other alleviating or aggravating factors.       Current Medications (verified): 1)   Methotrexate Sodium 25 Mg/ml Soln (Methotrexate Sodium) .Marland Kitchen.. 1 Inj Weekly 2)  Proair Hfa 108 (90 Base) Mcg/act Aers (Albuterol Sulfate) .... 2 Puffs Every 4-6 Hrs As Needed 3)  Meclizine Hcl 12.5 Mg Tabs (Meclizine Hcl) .... As Needed 4)  Folic Acid 1 Mg Tabs (Folic Acid) .Marland Kitchen.. 1 Once Daily 5)  Diazepam 5 Mg Tabs (Diazepam) .... 1/2 To 1 Once Daily As Needed 6)  Synthroid 88 Mcg Tabs (Levothyroxine Sodium) .Marland Kitchen.. 1 Once Daily 7)  Voltaren 1 % Gel (Diclofenac Sodium) .Marland Kitchen.. 1 Patch Twice Per Wk 8)  Zoloft 50 Mg Tabs (Sertraline Hcl) .... 1/2 Once Daily 9)  Nexium 40 Mg Cpdr (Esomeprazole Magnesium) .... Take  One 30-60 Min Before First Meal of The Day 10)  Symbicort 80-4.5 Mcg/act Aero (Budesonide-Formoterol Fumarate) .... 2 Puffs Two Times A Day As Needed  Allergies (verified): 1)  ! Demerol 2)  ! Codeine  Past History:  Past Medical History: ARTHRITIS, RHEUMATOID (ICD-714.0) DYSPNEA (ICD-786.05) Hx of CARCINOMA, LUNG, NONSMALL CELL (ICD-162.9)       - s/p RLLobectomy with last chemo 03/2005 and no rt COPD      -  HFA 75% p coaching February 25, 2011   Vital Signs:  Patient profile:   77 year old female Weight:      163.50 pounds  O2 Sat:      93 % on Room air Temp:     98.0 degrees F oral Pulse rate:   75 / minute BP sitting:   138 / 70  (left arm)  Vitals Entered By: Vernie Murders (February 25, 2011 9:26 AM)  O2 Flow:  Room air  Physical Exam  Additional Exam:   Anxious amb wf nad wt 158 December 17, 2010  > 162 January 16, 2011 > 163 February 25, 2011  HEENT mild turbinate edema.  Oropharynx no thrush or excess pnd or cobblestoning.  No JVD or cervical adenopathy. Mild accessory muscle hypertrophy. Trachea midline, nl thryroid. Chest was hyperinflated by percussion with diminished breath sounds and moderate increased exp time without wheeze. Hoover sign positive at mid inspiration. Regular rate and rhythm without murmur gallop or rub or increase P2 or edema.  Abd: no hsm, nl  excursion. Ext warm without cyanosis or clubbing.  Classic rheumatoid changes hands.    CXR  Procedure date:  03/26/2011  Findings:      IMPRESSION: Status post right lower lobectomy. No evidence of acute cardiopulmonary disease.   Impression & Recommendations:  Problem # 1:  ASTHMA, UNSPECIFIED (ICD-493.90)  DDX of  difficult airways managment all start with A and  include Adherence, Ace Inhibitors, Acid Reflux, Active Sinus Disease, Alpha 1 Antitripsin deficiency, Anxiety masquerading as Airways dz,  ABPA,  allergy(esp in young), Aspiration (esp in elderly), Adverse effects of DPI,  Active smokers, plus two Bs  = Bronchiectasis and Beta blocker use..and one C= CHF    Adherence:  I spent extra time with the patient today explaining optimal mdi  technique.  This improved from  50-75% p coaching - RA makes it difficult for her to trigger but with coaching does improve.  one option is change to advair the other option : change to Brovana and Budesonide, the equivalent of Symbicort, per neb. For now she wants to continue symbicort  ? acid reflux:  diet/ meds reviewed  Despite apparent uri doing ok on low dose symbicort so no change in rx pending f/u with pft's to sort out asthma vs copd   Each maintenance medication was reviewed in detail including most importantly the difference between maintenance and as needed and under what circumstances the prns are to be used. See instructions for specific recommendations   Medications Added to Medication List This Visit: 1)  Symbicort 80-4.5 Mcg/act Aero (Budesonide-formoterol fumarate) .... 2 puffs two times a day as needed  Other Orders: T-2 View CXR ([id number]) Est. Patient Level IV (16109)  Patient Instructions: 1)  Work on perfecting  inhaler technique:  relax and blow all the way out then take a nice smooth deep breath back in, triggering the inhaler at same time you start breathing in and hold for at least 5 secs  2)  Return to office in  3 months, sooner if needed with pfts  Appended Document: Pulmonary/ ext f/u with hfa 75% p coaching cxr does not suggest any recurrent dz.

## 2011-03-30 ENCOUNTER — Telehealth: Payer: Self-pay | Admitting: *Deleted

## 2011-03-30 NOTE — Telephone Encounter (Signed)
Error in creating encounter.

## 2011-04-01 LAB — BASIC METABOLIC PANEL
BUN: 10 mg/dL (ref 6–23)
CO2: 31 mEq/L (ref 19–32)
Calcium: 9.4 mg/dL (ref 8.4–10.5)
Chloride: 102 mEq/L (ref 96–112)
Creatinine, Ser: 0.97 mg/dL (ref 0.4–1.2)
GFR calc Af Amer: >60 mL/min (ref >60)
GFR calc non Af Amer: 56 mL/min — ABNORMAL LOW (ref >60)
Glucose, Bld: 125 mg/dL — ABNORMAL HIGH (ref 70–99)
Potassium: 3.8 mEq/L (ref 3.5–5.1)
Sodium: 138 mEq/L (ref 135–145)

## 2011-04-01 LAB — HEMOGLOBIN AND HEMATOCRIT, BLOOD
HCT: 38.9 % (ref 36.0–46.0)
Hemoglobin: 13.3 g/dL (ref 12.0–15.0)

## 2011-05-15 NOTE — Discharge Summary (Signed)
Lisa Vega, Lisa Vega NO.:  [id number]   MEDICAL RECORD NO.:  [id number]          PATIENT TYPE:  INP   LOCATION:  3309                         FACILITY:  [facility]   PHYSICIAN:  Ines Bloomer, M.D. DATE OF BIRTH:  20-Oct-1932   DATE OF ADMISSION:  11/10/2004  DATE OF DISCHARGE:  11/18/2004                                 DISCHARGE SUMMARY   HISTORY OF PRESENT ILLNESS:  The patient is a 77 year old female followed by  Dr. Coral Spikes with a history of rheumatoid arthritis.  She was referred to Dr.  Edwyna Shell in thoracic surgical consultation.  Recently, she has had a cough  with increased shortness of breath and evaluation including CT scan was done  and this revealed a 2.5 cm lesion in the superior segment of the right lower  lobe.  There was evidence of partial obstruction of the right lower lobe  bronchus.  Pulmonary consultation was obtained with Sandrea Hughs, M.D. who  proceeded with bronchoscopy and brushings revealing nonsmall cell lung  carcinoma.  Biopsy also showed this finding.  The patient denied hemoptysis.  However, did have dyspnea on exertion.  She denied pleuritic pain.  She has  a longstanding history of tobacco abuse and current usage of one pack per  day.  Dr. Edwyna Shell recommended PET scan and brain scan.  These were done and  subsequently findings were noted for suspicious malignancy on the PET scan.  However, the exact report is not currently available to the chart.  Additionally, the result of the head CT is not currently available to the  chart.  Dr. Edwyna Shell, however, did review these findings and recommended  proceeding with resection and she was admitted this hospitalization for the  procedure.   ALLERGIES:  No known drug allergies, but she does have a GI intolerance to  Demerol and codeine.   MEDICATIONS ON ADMISSION:  1.  Prilosec 40 mg daily.  2.  Zelnorm 6 mg b.i.d.  3.  Synthroid 0.75 mg daily.  4.  Prednisone 10 mg daily.  5.  Methotrexate  currently on hold.  6.  Valium 5 mg p.r.n.  7.  Zoloft 25 mg daily.  8.  Folic acid.   For family history, social history, review of symptoms, and physical exam,  please see the history and physical done at the time of admission.   HOSPITAL COURSE:  The patient was admitted electively and on November 10, 2004 taken to the operating room where she underwent the following  procedure.  Right thoracotomy with right bilobectomy.  The procedure  included resection of the right middle lobe, right lower lobe with lymph  node dissection.  The patient tolerated the procedure well and was taken to  the postanesthesia care unit in stable condition.   POSTOPERATIVE HOSPITAL COURSE:  The patient has done reasonably well.  She  was put on a stress dose of Solu-Medrol and subsequently converted to p.o.  prednisone home dose.  She was seen in pulmonary medicine consultation  during the consultation and nebulizers were used adjunctively to her therapy  and she showed a good and  gradual progression in her pulmonary improvement  and oxygen has been weaned with good saturations on room air.  Laboratory  values are stable with mild postoperative anemia.  Hemoglobin and hematocrit  dated November 13, 2004 were 11 and 32 respectively.  These were repeated on  November 17, 2004 and results are currently pending.  Additionally, results  of her most recent basic metabolic panel also pending.  Pathology has  returned.  The lesion from the right lower lobe resection shows the  following findings.  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  The visceral pleura was negative for tumor.  The tumor invades the walls of  a large pulmonary blood vessel, favor pulmonary artery.  Perineural invasion  is present.  Multiple nodular areas of organizing pneumonitis and or  bronchiolitis obliterans-organizing pneumonitis. And finally, bronchial  resection margins negative for tumor.  The resection of the right  middle  lobe lesion showed the following findings.  Nodular focus of bronchiolitis  obliterans, organizing pneumonitis associated with focal acute inflammation.  Additionally, the bronchial resection margins were negative for tumor.  A  level 12 L lymph node was negative for tumor.  Multiple lymph nodes were  also sampled.  All negative for tumor.  A rib resection of the 7th rib was  negative for tumor and including finding of a trilineage hematopoiesis.  This tumor has staged as a T2.  Oncology consultation has been obtained.  Dr. Arbutus Ped does recommend adjuvant chemotherapy with a platin based regimen  in approximately 4-6 weeks after discharge.  Followup appointments are  arranged.  Overall, the patient is felt to be tentatively stable for  discharge in the morning of November 18, 2004 pending morning round re-  evaluation.  It is noted the patient has had some significant trouble with  constipation during hospitalization.  Has had multiple laxatives.  This will  also be evaluated prior to discharge.   MEDICATIONS ON DISCHARGE:  As preoperatively.  Additionally for pain, Tylox  1-2 q.4-6h as needed.   INSTRUCTIONS:  The patient received written instructions in regard to  medications, activity, diet, wound care and followup.  Followup will include  Dr. Arbutus Ped at the cancer center on November 28 at 11:30 a.m.  Dr. Edwyna Shell  will see the patient on November 30 at 12 noon with a chest x-ray from the  [facility].   CONDITION ON DISCHARGE:  Stable and improved.   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   OTHER DIAGNOSES:  1.  Evidence of bronchiolitis obliterans (BOOP).  2.  Evidence of possible organizing pneumonitis.   Other diagnoses as per history.       WEG/MEDQ  D:  11/17/2004  T:  11/17/2004  Job:  [id number]   cc:   Charlaine Dalton. Sherene Sires, M.D. [facility]   Lajuana Matte, MD Fax: [phone]   Demetria Pore. Coral Spikes, M.D.   [address]  [address]  [place]  [place] [address]  Fax: [phone]

## 2011-05-15 NOTE — Op Note (Signed)
NAMESENORA, LACSON NO.:  [id number]   MEDICAL RECORD NO.:  [id number]          PATIENT TYPE:  OUT   LOCATION:  XRAY                         FACILITY:  [facility]   PHYSICIAN:  Ines Bloomer, M.D. DATE OF BIRTH:  07-19-1932   DATE OF PROCEDURE:  DATE OF DISCHARGE:  11/05/2004                                 OPERATIVE REPORT   PREOPERATIVE DIAGNOSIS:  Nonsmall cell lung cancer of the right lower lobe.   POSTOPERATIVE DIAGNOSIS:  Nonsmall cell lung cancer of the right lower lobe.   OPERATION PERFORMED:  Right thoracotomy, right bilobectomy.   SURGEON:  Ines Bloomer, M.D.   ASSISTANT:  Zenaida Niece, RN, FA   ANESTHESIA:  General.   DESCRIPTION OF PROCEDURE:  After percutaneous insertion of all monitoring  lines, the patient was turned to the right lateral thoracotomy position and  was prepped and draped in the usual sterile manner.  Two trocar sites were  made in the anterior and posterior axillary lines at the seventh intercostal  space.  A 30 degree scope was inserted and exploration was carried out.  Lesion was seen in the fissure on the right lower lobe near the right lower  lobe pulmonary artery.  A posterolateral thoracotomy incision was made over  the sixth intercostal space.  The portion of the sixth rib was taken  subperiosteally.  Seventh rib was taken subperiosteally at the ankle. Two  Tuffiers were placed at right angles after dividing the latissimus and  reflecting the serratus anteriorly.  The dissection was started taking down  the inferior pulmonary ligament and dissecting out several 9R nodes and  dissecting superiorly doing a subcarinal dissection dissecting out several 7  nodes.  Then attention was turned to the fissure and the pulmonary artery  was dissected out.  The inferior portion of the major fissure was then  divided with the EZ-stapler.  Inferior pulmonary vein was dissected out and  looped with a vascular tape.  The superior  portion of the fissure was then  dissected out and divided with the EZ-45 stapler.  Several 11R and one 10R  node were resected.  The lesion unfortunately came out of the superior  segment of the right lower lobe and was such that we could not do a right  lower lobectomy without doing a bilobectomy.  The right middle lobe orifice  came up actually inferior to the superior segment orifice.  Because of this,  I had to do a bilobectomy.  Her pulmonary function tests were marginal, but  it looked like she should be able to withstand that.  The minor fissure was  divided with the EZ-45 stapler.  The lateral branch to the middle lobe was  doubly ligated with silk and divided.  In dissecting up the medial branch  was doubly ligated with 2-0 silk  and divided.  The vein to the middle lobe  was divided with an Systems analyst.  The right middle lobe bronchus was  then divided with the EZ-45 stapler and the right middle lobe was removed.  After this was done, we were able to  free up the bronchus intermedius and  dissect it up to just inferior to where the right upper lobe came off and  divided it with the EZ-45 stapler.  A pleural flap was placed over the  bronchial stump.  CoSeal was applied to the staple line.  One area of leak  was sewed with 3-0 Vicryl.  A right angle chest tube was placed posteriorly  in the space and a straight chest tube anteriorly.  The OnQ catheter were  placed in the  paravertebral space in the usual fashion.  Chest was closed with four  pericostals, #1 Vicryl in the muscle layer, 2-0  Vicryl in the subcutaneous  tissue and Dermabond for the skin.  The patient was then transferred to the  recovery room in stable condition.       DPB/MEDQ  D:  11/10/2004  T:  11/10/2004  Job:  [id number]   cc:   Charlaine Dalton. Sherene Sires, M.D. [facility]

## 2011-05-15 NOTE — Consult Note (Signed)
Lisa Vega, Lisa Vega NO.:  [id number]   MEDICAL RECORD NO.:  [id number]          PATIENT TYPE:  INP   LOCATION:  3309                         FACILITY:  [facility]   PHYSICIAN:  Lajuana Matte, MD  DATE OF BIRTH:  02-01-1932   DATE OF CONSULTATION:  11/13/2004  DATE OF DISCHARGE:                                   CONSULTATION   CONSULTING PHYSICIAN:  Dr. Arbutus Ped   REASON FOR CONSULTATION:  Lung cancer.   REFERRING PHYSICIAN:  Dr. Edwyna Shell   HISTORY OF PRESENT ILLNESS:  Lisa Vega is a 77 year old female asked to  see in consultation for evaluation of lung cancer.  She was found to have  lesion on right lower lobe per chest x-ray when she presented with  persistent cough on October 09, 2004.  A CT of the chest was performed,  demonstrating an irregular 2.5 cm mass in the posterior aspect of the right  hilar, in the right lobe superior segment, and partial obstruction of the  right lower lobe bronchus.  In addition, a tiny ill-defined nodular density  peripheral in the right lower lobe, possibly inflammatory was seen.  No  mediastinal lymphadenopathy.  A follow up PET scan showed positivity in the  areas mentioned, with a maximum SUV of 9.5.  No metastatic changes in the  rest of the PET scan.  She underwent a right middle lobectomy and right  lower lobectomy on November 10, 2004.  The pathology is currently pending.   PAST MEDICAL HISTORY:  1.  Possible lung cancer, as above.  2.  Rheumatoid arthritis, on methotrexate.  3.  Hypothyroidism.  4.  Irritable bowel syndrome.  5.  Hyperlipidemia.   SURGERIES/PROCEDURES:  1.  Status post fiberoptic bronchoscopy with endobronchial biopsy of the      right lower lobe orifice by Dr. Edwyna Shell.  2.  Status post right VATS, right middle lobectomy, right lower lobectomy      with node dissection November 10, 2004.  3.  Status post appendectomy.   ALLERGIES:  CODEINE, DEMEROL, ASPIRIN.   CURRENT MEDICATIONS:  1.   Zelnorm 6 mg daily.  2.  Prednisone 10 mg daily.  3.  Solu-Medrol 20 mg q.8h.  4.  Synthroid 75 mcg daily.  5.  Novolog 2 to 15 units q.6 h.  6.  Zinacef 1.5 mg IV q.12h.   REVIEW OF SYSTEMS:  Remarkable for some dyspnea on exertion and fatigue.  No  weight loss or decrease in appetite.  No cough.  No cardiac or GI  complaints.  No dysphagia, no dysuria or gross hematuria.  No venous stasis  or peripheral edema.   FAMILY HISTORY:  Mother died with lung disease.  Father died with colon and  pancreatic cancer.  Two sisters died with heart disease.  Two brothers  alive, one with a history of heart disease, and one with diabetes.   SOCIAL HISTORY:  The patient is married for 16 years.  She has 3 grown  children.  She is a Market researcher.  She smokes 1 pack a day of  cigarettes for the  last 47 years.  She drinks only on weekends 1 or 2 beers.  The patient lives in [place], brother's house.  His number is [phone].   HEALTH MAINTENANCE:  The last mammogram was in 2004, normal.  Last  colonoscopy was 5 years ago, normal.  She had a flu shot in 2004.   PHYSICAL EXAMINATION:  GENERAL:  This is a 77 year old white female in no  acute distress, alert and oriented x3.  VITAL SIGNS:  Blood pressure 130/50, pulse 75, respirations 21, temperature  97, weight 164 pounds, height 64 inches.  Pulse oximetry 92% on room air.  HEENT:  Normocephalic, atraumatic.  PERRLA.  No thrush in the oral mucosa.  NECK:  Supple, no JVD.  No cervical or supraclavicular masses.  CHEST:  With decreased breath sounds in the right base.  The chest tube in  currently draining.  Incision though is clean and dry.  There are slight  atelectatic sounds.  No axillary masses.  CARDIOVASCULAR:  Regular rate and rhythm with no murmurs, rubs, or gallops.  ABDOMEN:  Soft, nontender.  Bowel sounds x4.  No palpable spleen or liver.  GENITOURINARY AND RECTAL:  Deferred.  EXTREMITIES:  With no clubbing, no cyanosis, no  edema. Skin with no  petechiae or purpura.  There are arthritic changes in her hands.  NEUROLOGICAL:  Nonfocal.   LABORATORY DATA:  Hemoglobin 12.8, hematocrit 36.4, white count 13.9,  platelets 264, MCV 95.2.  PT 13.4, PTT 33, INR 1.  Sodium 131, potassium  3.6, BUN 7, creatinine 0.8, glucose 227, total bilirubin 0.6, alkaline  phosphatase 68, AST 16, ALT 12, total protein 6.5, albumin 3.3, calcium 8.7.   ASSESSMENT AND PLAN:  Dr. Arbutus Ped has seen and evaluated the patient and the  chart has been reviewed.  This is a 77 year old white female who presented  with a 2.5 cm right lower lobe lung lesion, with a PET positive, status post  right thoracotomy, right bi-lobectomy (right lower lobe and right middle  lobe), revealing a 3.3 cm poorly differentiated squamous cell carcinoma with  negative lymph nodes for metastases.   PLAN:  This is a stage 1B (T2 N0 M0) non-small-cell lung carcinoma.  The  patient will benefit from adjuvant chemotherapy with platinum based regimen  4 to 6 weeks after discharge.  We will arrange follow up with Dr. Arbutus Ped at  the [facility] in 2 to 3 weeks.  Thank you very much for  allowing [place] to participate in the care of Lisa Vega.  Will base regimen  every 4 to 6 weeks after discharge.      Huntley Dec   SW/MEDQ  D:  11/17/2004  T:  11/17/2004  Job:  [id number]   cc:   Ines Bloomer, M.D.  [address]  [place]  [place] [address]

## 2011-05-15 NOTE — Op Note (Signed)
NAMEKI, LUCKMAN NO.:  [id number]   MEDICAL RECORD NO.:  [id number]          PATIENT TYPE:  OUT   LOCATION:  CARD                         FACILITY:  [facility]   PHYSICIAN:  Charlaine Dalton. Sherene Sires, M.D. [facility] OF BIRTH:  08-Dec-1932   DATE OF PROCEDURE:  10/21/2004  DATE OF DISCHARGE:                                 OPERATIVE REPORT   PROCEDURE:  Fiberoptic bronchoscopy with an endobronchial biopsy of the  right lower lobe orifice.   REFERRED BY:  Demetria Pore. Coral Spikes, M.D.   INDICATIONS FOR PROCEDURE:  This is a 77 year old white female smoker with a  persistent cough and a mass in the right lower lobe by chest x-ray and CT  scan.  Please see comprehensive pulmonary consultation note dictated on this  patient from the office within the past week.  There has been no change in  the history exam since this time. She agreed to the procedure after a full  discussion of the risks, benefits, and alternatives.   The right nares and oropharynx were liberally anesthetized with 1% lidocaine  by updraft nebulizer. The right naris was additionally prepared with 2%  lidocaine jelly.   Using a standard video bronchoscope, the right naris was easily cannulated  with good visualization of the entire oropharynx and larynx. The cords moved  normally and there were no apparent upper airway lesions.   Using an additional 1% lidocaine as needed, the entire tracheobronchial tree  was explored bilaterally with the following findings:   1.  Trachea, carina and all the airways on the left were normal.  2.  The right side was normal until the distal bronchus intermedius where      there appeared to be heaped up tissue forming along the lateral wall of      the bronchus intermedius which completely obstructed the right lower      lobe at its takeoff from just distal to the bronchus intermedius (the      superior segment could not be visualized).   DESCRIPTION OF PROCEDURE:  Using an  endobronchial forceps technique, I  obtained 6-8 endobronchial biopsies with minimal bleeding.  This area was  also lavaged.   IMPRESSION:  Complete obstruction of the right lower lobe beyond the takeoff  of the right middle lobe consistent with bronchogenic carcinoma (Path  pending).   RECOMMENDATIONS:  Await tissue for histology and special stains if indicated  and washings also for cytology.      MBW/MEDQ  D:  10/21/2004  T:  10/21/2004  Job:  [id number]   cc:   Demetria Pore. Coral Spikes, M.D.  [address]  [address]  [place]  [place] [address]  Fax: [phone]

## 2011-06-19 ENCOUNTER — Other Ambulatory Visit: Payer: Self-pay | Admitting: Gastroenterology

## 2011-06-19 DIAGNOSIS — R110 Nausea: Secondary | ICD-10-CM

## 2011-06-24 ENCOUNTER — Ambulatory Visit
Admission: RE | Admit: 2011-06-24 | Discharge: 2011-06-24 | Disposition: A | Discharge status: Home / Self Care | Payer: Medicare Other | Source: Ambulatory Visit | Attending: Gastroenterology | Admitting: Gastroenterology

## 2011-06-24 DIAGNOSIS — R110 Nausea: Secondary | ICD-10-CM

## 2011-07-17 ENCOUNTER — Other Ambulatory Visit (HOSPITAL_COMMUNITY): Payer: Self-pay

## 2011-08-05 ENCOUNTER — Other Ambulatory Visit: Payer: Self-pay | Admitting: Internal Medicine

## 2011-08-05 DIAGNOSIS — C3490 Malignant neoplasm of unspecified part of unspecified bronchus or lung: Secondary | ICD-10-CM

## 2011-08-10 ENCOUNTER — Other Ambulatory Visit: Payer: Self-pay | Admitting: Internal Medicine

## 2011-08-10 ENCOUNTER — Encounter (HOSPITAL_BASED_OUTPATIENT_CLINIC_OR_DEPARTMENT_OTHER): Payer: Medicare Other | Admitting: Internal Medicine

## 2011-08-10 ENCOUNTER — Encounter (HOSPITAL_COMMUNITY): Payer: Self-pay

## 2011-08-10 ENCOUNTER — Ambulatory Visit (HOSPITAL_COMMUNITY)
Admission: RE | Admit: 2011-08-10 | Discharge: 2011-08-10 | Disposition: A | Discharge status: Home / Self Care | Payer: Medicare Other | Source: Ambulatory Visit | Attending: Internal Medicine | Admitting: Internal Medicine

## 2011-08-10 DIAGNOSIS — R0602 Shortness of breath: Secondary | ICD-10-CM | POA: Insufficient documentation

## 2011-08-10 DIAGNOSIS — I2789 Other specified pulmonary heart diseases: Secondary | ICD-10-CM | POA: Insufficient documentation

## 2011-08-10 DIAGNOSIS — C3430 Malignant neoplasm of lower lobe, unspecified bronchus or lung: Secondary | ICD-10-CM

## 2011-08-10 DIAGNOSIS — C3490 Malignant neoplasm of unspecified part of unspecified bronchus or lung: Secondary | ICD-10-CM

## 2011-08-10 DIAGNOSIS — R079 Chest pain, unspecified: Secondary | ICD-10-CM | POA: Insufficient documentation

## 2011-08-10 HISTORY — DX: Malignant (primary) neoplasm, unspecified: C80.1

## 2011-08-10 LAB — CMP (CANCER CENTER ONLY)
ALT(SGPT): 21 U/L (ref 10–47)
AST: 26 U/L (ref 11–38)
Albumin: 3.3 g/dL (ref 3.3–5.5)
Alkaline Phosphatase: 67 U/L (ref 26–84)
BUN, Bld: 15 mg/dL (ref 7–22)
CO2: 29 mEq/L (ref 18–33)
Calcium: 8.9 mg/dL (ref 8.0–10.3)
Chloride: 98 mEq/L (ref 98–108)
Creat: 0.9 mg/dl (ref 0.6–1.2)
Glucose, Bld: 98 mg/dL (ref 73–118)
Potassium: 4.4 mEq/L (ref 3.3–4.7)
Sodium: 139 mEq/L (ref 128–145)
Total Bilirubin: 0.7 mg/dl (ref 0.20–1.60)
Total Protein: 6.8 g/dL (ref 6.4–8.1)

## 2011-08-10 LAB — CBC WITH DIFFERENTIAL/PLATELET
BASO%: 0.6 % (ref 0.0–2.0)
Basophils Absolute: 0 10*3/uL (ref 0.0–0.1)
EOS%: 1.4 % (ref 0.0–7.0)
Eosinophils Absolute: 0.1 10*3/uL (ref 0.0–0.5)
HCT: 36.9 % (ref 34.8–46.6)
HGB: 12.6 g/dL (ref 11.6–15.9)
LYMPH%: 42.4 % (ref 14.0–49.7)
MCH: 34 pg (ref 25.1–34.0)
MCHC: 34.2 g/dL (ref 31.5–36.0)
MCV: 99.5 fL (ref 79.5–101.0)
MONO#: 0.6 10*3/uL (ref 0.1–0.9)
MONO%: 8.3 % (ref 0.0–14.0)
NEUT#: 3.2 10*3/uL (ref 1.5–6.5)
NEUT%: 47.3 % (ref 38.4–76.8)
Platelets: 188 10*3/uL (ref 145–400)
RBC: 3.71 10*6/uL (ref 3.70–5.45)
RDW: 14.9 % — ABNORMAL HIGH (ref 11.2–14.5)
WBC: 6.8 10*3/uL (ref 3.9–10.3)
lymph#: 2.9 10*3/uL (ref 0.9–3.3)

## 2011-08-10 MED ORDER — IOHEXOL 300 MG/ML  SOLN
100.0000 mL | Freq: Once | INTRAMUSCULAR | Status: AC | PRN
  Administered 2011-08-10: 100 mL via INTRAVENOUS

## 2011-08-12 ENCOUNTER — Encounter (HOSPITAL_BASED_OUTPATIENT_CLINIC_OR_DEPARTMENT_OTHER): Payer: Medicare Other | Admitting: Internal Medicine

## 2011-08-12 DIAGNOSIS — C3430 Malignant neoplasm of lower lobe, unspecified bronchus or lung: Secondary | ICD-10-CM

## 2011-10-27 ENCOUNTER — Ambulatory Visit
Admission: RE | Admit: 2011-10-27 | Discharge: 2011-10-27 | Disposition: A | Discharge status: Home / Self Care | Payer: Medicare Other | Source: Ambulatory Visit | Attending: Gynecology | Admitting: Gynecology

## 2011-10-27 ENCOUNTER — Other Ambulatory Visit: Payer: Self-pay | Admitting: Gynecology

## 2011-10-27 DIAGNOSIS — R1900 Intra-abdominal and pelvic swelling, mass and lump, unspecified site: Secondary | ICD-10-CM

## 2011-10-27 DIAGNOSIS — K5792 Diverticulitis of intestine, part unspecified, without perforation or abscess without bleeding: Secondary | ICD-10-CM

## 2011-10-27 MED ORDER — IOHEXOL 300 MG/ML  SOLN
100.0000 mL | Freq: Once | INTRAMUSCULAR | Status: AC | PRN
  Administered 2011-10-27: 100 mL via INTRAVENOUS

## 2012-01-06 DIAGNOSIS — K6289 Other specified diseases of anus and rectum: Secondary | ICD-10-CM | POA: Diagnosis not present

## 2012-01-06 DIAGNOSIS — N8110 Cystocele, unspecified: Secondary | ICD-10-CM | POA: Diagnosis not present

## 2012-01-27 DIAGNOSIS — Z79899 Other long term (current) drug therapy: Secondary | ICD-10-CM | POA: Diagnosis not present

## 2012-01-27 DIAGNOSIS — M069 Rheumatoid arthritis, unspecified: Secondary | ICD-10-CM | POA: Diagnosis not present

## 2012-01-27 DIAGNOSIS — M2550 Pain in unspecified joint: Secondary | ICD-10-CM | POA: Diagnosis not present

## 2012-01-27 DIAGNOSIS — R0602 Shortness of breath: Secondary | ICD-10-CM | POA: Diagnosis not present

## 2012-01-27 DIAGNOSIS — J069 Acute upper respiratory infection, unspecified: Secondary | ICD-10-CM | POA: Diagnosis not present

## 2012-01-27 DIAGNOSIS — R0789 Other chest pain: Secondary | ICD-10-CM | POA: Diagnosis not present

## 2012-02-03 ENCOUNTER — Encounter: Payer: Self-pay | Admitting: Adult Health

## 2012-02-03 ENCOUNTER — Telehealth: Payer: Self-pay | Admitting: Internal Medicine

## 2012-02-03 ENCOUNTER — Encounter: Payer: Self-pay | Admitting: *Deleted

## 2012-02-03 DIAGNOSIS — N8111 Cystocele, midline: Secondary | ICD-10-CM | POA: Diagnosis not present

## 2012-02-03 DIAGNOSIS — N816 Rectocele: Secondary | ICD-10-CM | POA: Diagnosis not present

## 2012-02-03 DIAGNOSIS — R141 Gas pain: Secondary | ICD-10-CM | POA: Diagnosis not present

## 2012-02-03 DIAGNOSIS — R1011 Right upper quadrant pain: Secondary | ICD-10-CM | POA: Diagnosis not present

## 2012-02-03 DIAGNOSIS — R142 Eructation: Secondary | ICD-10-CM | POA: Diagnosis not present

## 2012-02-03 DIAGNOSIS — R143 Flatulence: Secondary | ICD-10-CM | POA: Diagnosis not present

## 2012-02-03 DIAGNOSIS — J449 Chronic obstructive pulmonary disease, unspecified: Secondary | ICD-10-CM | POA: Diagnosis not present

## 2012-02-03 NOTE — Telephone Encounter (Signed)
Fine with me

## 2012-02-03 NOTE — Telephone Encounter (Signed)
Dr. Shelle Iron, would you be willing to accept her as a new patient?  Pls advise.

## 2012-02-03 NOTE — Telephone Encounter (Signed)
Pt scheduled with KC and PFT on Tues., 02/23/12. PFT @ 1pm and KC appt @ 3pm. Pt is aware.

## 2012-02-03 NOTE — Telephone Encounter (Signed)
Pt c/o cough and congestion x 3 weeks and wants to be seen ASAP but would like to change doctors if possible. Pt ha agreed to see TP on Thurs., 02/04/12 for her immediate issue of cough and congestion. She would like to switch to either Dr. Shelle Iron or Dr. Delford Field per recommendation from Dr.Magod. Dr. Sherene Sires, pls advise if you are okay with this change. You last saw patient in 01/2011.

## 2012-02-03 NOTE — Telephone Encounter (Signed)
Ok with me, but would like her to have full pfts the same day as my visit.  Will need to be consult slot., Needs to be at least 2-3 weeks out from visit with TP so that her breathing will hopefully be at baseline.

## 2012-02-04 ENCOUNTER — Ambulatory Visit (INDEPENDENT_AMBULATORY_CARE_PROVIDER_SITE_OTHER): Payer: Medicare Other | Admitting: Adult Health

## 2012-02-04 ENCOUNTER — Encounter: Payer: Self-pay | Admitting: Adult Health

## 2012-02-04 VITALS — BP 116/62 | HR 73 | Temp 96.8°F | Ht 64.0 in | Wt 164.0 lb

## 2012-02-04 DIAGNOSIS — J45909 Unspecified asthma, uncomplicated: Secondary | ICD-10-CM

## 2012-02-04 MED ORDER — AMOXICILLIN-POT CLAVULANATE 875-125 MG PO TABS: 1.0000 | ORAL_TABLET | Freq: Two times a day (BID) | ORAL | Status: AC

## 2012-02-04 MED ORDER — LEVALBUTEROL HCL 0.63 MG/3ML IN NEBU
0.6300 mg | INHALATION_SOLUTION | Freq: Once | RESPIRATORY_TRACT | Status: AC
  Administered 2012-02-04: 0.63 mg via RESPIRATORY_TRACT

## 2012-02-04 NOTE — Progress Notes (Signed)
Addended by: Tommie Sams on: 02/04/2012 04:06 PM   Modules accepted: Orders

## 2012-02-04 NOTE — Patient Instructions (Signed)
Augmentin 875mg  Twice daily  For 7 days  Mucinex DM Twice daily  As needed  Cough/congestion  Restart Symbicort 80/4.37mcg 2 puffs Twice daily  Please contact office for sooner follow up if symptoms do not improve or worsen or seek emergency care  follow up Dr. Shelle Iron in 2-3 weeks and As needed

## 2012-02-04 NOTE — Assessment & Plan Note (Signed)
Flare w/ associated bronchitis  She says she can not take steroids  Will obtain xray results    Plan:  Augmentin 875mg  Twice daily  For 7 days  Mucinex DM Twice daily  As needed  Cough/congestion  Restart Symbicort 80/4.55mcg 2 puffs Twice daily  Please contact office for sooner follow up if symptoms do not improve or worsen or seek emergency care  follow up Dr. Shelle Iron in 2-3 weeks and As needed

## 2012-02-04 NOTE — Progress Notes (Signed)
Subjective:    Patient ID: Lisa Vega, female    DOB: Jun 05, 1932, 78 y.o.   MRN: [id number]  HPI 45 yowf quit smoking 2004 s/p RUL lobectomy 10/2004 for [facility] Lung ca with PFT's 2007 FEV1 72% with ratio 60   December 17, 2010 1st pulmonary office eval for worsening sob for about a year on symbicort" for sev days at a time" not maintaining on it and generally not needing saba more than 2 x weekly but for the last 3 months having spells sob sometimes at rest, sometimes with simple activity but does not occur sleeping or on treadmill (but did not do x last 3weeks) . Reports proaire works better than symbicort 80  no cough, some nasal congestion but no change in severity from baseline.Imp was AB rx = Dulera 100 2 puffs first thing in am and 2 puffs again in pm about 12 hours later  Work on inhaler technique: relax and blow all the way out then take a nice smooth deep breath back in, triggering the inhaler at same time you start breathing in  GERD (REFLUX) diet   January 16, 2011 ov cc sob but only 50% effective with hfa and only used up 60 puffs off dulera so far by the counter with ultimate goal of walking 30 min at a time. no sign cough, no nocturnal exac. Continue dulera 100 2 puffs first thing in am and 2 puffs again in pm about 12 hours later  Work on inhaler technique   February 25, 2011 ov did not tolerate dulera but doing ok on symbicort 80 2bid. no chronic cough. acutely worse 2/24 with uri. baseline = doe with fast walk, esp uphill. no purulent sputum.    02/04/2012 Acute OV   Complains of  dry cough, SOB w/ exertion and at rest, wheezing, chest tightness, some nasal congestion, PND x 3 weeks. Seen by rheumatologist last week, cxr done per pt. Told xray was okay. Rx zpack with no help. Coughing up green mucus last week. Now it is white. No hemoptysis. No edema.. No chest pain . No n/v/d. Ran out of symbicort 2 weeks ago. Using proair daily .  Seen by Dr. Ewing Schlein yesterday and recommended to  see pulmonary today.   PMH:  ARTHRITIS, RHEUMATOID (ICD-714.0)  DYSPNEA (ICD-786.05)  Hx of CARCINOMA, LUNG, NONSMALL CELL (ICD-162.9)  - s/p RLLobectomy with last chemo 03/2005 and no rt  COPD  - HFA 75% p coaching February 25, 2011     Review of Systems Constitutional:   No  weight loss, night sweats,  Fevers, chills, fatigue, or  lassitude.  HEENT:   No headaches,  Difficulty swallowing,  Tooth/dental problems, or  Sore throat,                No sneezing, itching, ear ache, nasal congestion, post nasal drip,   CV:  No chest pain,  Orthopnea, PND, swelling in lower extremities, anasarca, dizziness, palpitations, syncope.   GI  No heartburn, indigestion, abdominal pain, nausea, vomiting, diarrhea, change in bowel habits, loss of appetite, bloody stools.   Resp   No coughing up of blood.     No chest wall deformity  Skin: no rash or lesions.  GU: no dysuria, change in color of urine, no urgency or frequency.  No flank pain, no hematuria   MS:     No back pain.  Psych:  No change in mood or affect. No depression or anxiety.  No memory loss.  Objective:   Physical Exam GEN: A/Ox3; pleasant , NAD, well nourished   HEENT:  [place]/AT,  EACs-clear, TMs-wnl, NOSE-clear, THROAT-clear, no lesions, no postnasal drip or exudate noted.   NECK:  Supple w/ fair ROM; no JVD; normal carotid impulses w/o bruits; no thyromegaly or nodules palpated; no lymphadenopathy.  RESP  Clear  P & A; w/o, wheezes/ rales/ or rhonchi.no accessory muscle use, no dullness to percussion  CARD:  RRR, no m/r/g  , no peripheral edema, pulses intact, no cyanosis or clubbing.  GI:   Soft & nt; nml bowel sounds; no organomegaly or masses detected.  Musco: Warm bil, severe arthritic changes in hands   Neuro: alert, no focal deficits noted.    Skin: Warm, no lesions or rashes         Assessment & Plan:

## 2012-02-04 NOTE — Progress Notes (Deleted)
  Subjective:    Patient ID: Lisa Vega, female    DOB: 05-08-1932, 78 y.o.   MRN: [id number]  HPI    Review of Systems     Objective:   Physical Exam        Assessment & Plan:

## 2012-02-05 ENCOUNTER — Encounter: Payer: Self-pay | Admitting: Adult Health

## 2012-02-08 NOTE — Progress Notes (Signed)
Acute ov reviewed and agree with plan as outlined.  

## 2012-02-09 DIAGNOSIS — H18519 Endothelial corneal dystrophy, unspecified eye: Secondary | ICD-10-CM | POA: Diagnosis not present

## 2012-02-09 DIAGNOSIS — Z961 Presence of intraocular lens: Secondary | ICD-10-CM | POA: Diagnosis not present

## 2012-02-23 ENCOUNTER — Institutional Professional Consult (permissible substitution): Payer: Medicare Other | Admitting: Pulmonary Disease

## 2012-03-04 ENCOUNTER — Institutional Professional Consult (permissible substitution): Payer: Medicare Other | Admitting: Pulmonary Disease

## 2012-03-11 ENCOUNTER — Encounter: Payer: Self-pay | Admitting: Pulmonary Disease

## 2012-03-11 ENCOUNTER — Ambulatory Visit (INDEPENDENT_AMBULATORY_CARE_PROVIDER_SITE_OTHER): Payer: Medicare Other | Admitting: Pulmonary Disease

## 2012-03-11 VITALS — BP 122/70 | HR 73 | Temp 97.9°F | Ht 64.0 in | Wt 162.0 lb

## 2012-03-11 DIAGNOSIS — J45909 Unspecified asthma, uncomplicated: Secondary | ICD-10-CM

## 2012-03-11 DIAGNOSIS — R0602 Shortness of breath: Secondary | ICD-10-CM | POA: Diagnosis not present

## 2012-03-11 DIAGNOSIS — C3490 Malignant neoplasm of unspecified part of unspecified bronchus or lung: Secondary | ICD-10-CM

## 2012-03-11 DIAGNOSIS — J449 Chronic obstructive pulmonary disease, unspecified: Secondary | ICD-10-CM | POA: Diagnosis not present

## 2012-03-11 LAB — PULMONARY FUNCTION TEST

## 2012-03-11 NOTE — Patient Instructions (Signed)
Would increase symbicort to 160/4.5 strength, and take 2 puffs am and pm everyday.  Keep mouth rinsed. Work on getting 30-67min of exercise 3-4 days a week to help with conditioning Will check echo of your heart, and call you with results. followup with me in 4mos.

## 2012-03-11 NOTE — Progress Notes (Signed)
  Subjective:    Patient ID: Lisa Vega, female    DOB: 1932-02-28, 78 y.o.   MRN: [id number]  HPI The patient is a 78 year old female who comes in today for management of dyspnea.  She has known moderate COPD from prior PFTs, and has been on symbicort low-dose with improvement in her symptoms.  She also has a history of rheumatoid arthritis for which she is on methotrexate, however her recent chest x-ray showed no evidence for interstitial disease or pulmonary infiltrates.  The patient states that she developed increasing shortness of breath and chest tightness in November of last year, but spontaneously resolved.  This recurred in January of this year, and she was started on symbicort.  She has improved, but feels that she has never gotten back to her usual baseline.  She describes a 3-4 block dyspnea on exertion on flat ground at a moderate pace, and will get winded bringing groceries in from the car.  She does not get short of breath with light housework.  She denies any significant cough or mucus production, and has not had any lower extremity edema.  The patient has not had a recent cardiac evaluation.  She did have PFTs today which showed moderate airflow obstruction, but were stable from 2007.  Her diffusion capacity was mildly reduced.   Review of Systems  Constitutional: Negative for fever and unexpected weight change.  HENT: Positive for ear pain, congestion, sore throat, rhinorrhea, sneezing, trouble swallowing, postnasal drip and sinus pressure. Negative for nosebleeds and dental problem.   Eyes: Negative for redness and itching.  Respiratory: Positive for chest tightness and shortness of breath. Negative for cough and wheezing.   Cardiovascular: Negative for palpitations and leg swelling.  Gastrointestinal: Negative for nausea and vomiting.  Genitourinary: Negative for dysuria.  Musculoskeletal: Positive for joint swelling.  Skin: Negative for rash.  Neurological: Positive for  headaches.  Hematological: Does not bruise/bleed easily.  Psychiatric/Behavioral: Positive for dysphoric mood. The patient is nervous/anxious.        Objective:   Physical Exam Constitutional:  Well developed, no acute distress  HENT:  Nares patent without discharge  Oropharynx without exudate, palate and uvula are normal  Eyes:  Perrla, eomi, no scleral icterus  Neck:  No JVD, no TMG  Cardiovascular:  Normal rate, regular rhythm, no rubs or gallops.  2/6 sem        Intact distal pulses  Pulmonary :  Normal breath sounds, no stridor or respiratory distress   No rales, rhonchi, or wheezing  Abdominal:  Soft, nondistended, bowel sounds present.  No tenderness noted.   Musculoskeletal:  No lower extremity edema noted.  Lymph Nodes:  No cervical lymphadenopathy noted  Skin:  No cyanosis noted  Neurologic:  Alert, appropriate, moves all 4 extremities without obvious deficit.         Assessment & Plan:

## 2012-03-11 NOTE — Assessment & Plan Note (Signed)
The patient has moderate COPD by her recent pulmonary function studies, however they are unchanged from 2007.  The patient currently is on the lower dose of symbicort, and feels it has benefited her.  I would like to increase her to the next dose, and see if this helps her further.

## 2012-03-11 NOTE — Assessment & Plan Note (Signed)
I suspect the patient's dyspnea on exertion is multifactorial.  She has moderate COPD, is clearly deconditioned, and it is unclear whether she may have an underlying cardiac issue.  There is no evidence for interstitial lung disease associated with rheumatoid arthritis, or any pulmonary issues associated with her methotrexate use.  She does have a murmur on exam, and the question has been raised by radiology concerning pulmonary hypertension.  We'll check an echocardiogram to evaluate.

## 2012-03-11 NOTE — Progress Notes (Signed)
PFT done today. 

## 2012-03-14 ENCOUNTER — Encounter: Payer: Self-pay | Admitting: Internal Medicine

## 2012-03-14 ENCOUNTER — Institutional Professional Consult (permissible substitution): Payer: Medicare Other | Admitting: Pulmonary Disease

## 2012-03-16 DIAGNOSIS — Z79899 Other long term (current) drug therapy: Secondary | ICD-10-CM | POA: Diagnosis not present

## 2012-03-16 DIAGNOSIS — K219 Gastro-esophageal reflux disease without esophagitis: Secondary | ICD-10-CM | POA: Diagnosis not present

## 2012-03-16 DIAGNOSIS — J069 Acute upper respiratory infection, unspecified: Secondary | ICD-10-CM | POA: Diagnosis not present

## 2012-03-16 DIAGNOSIS — R1011 Right upper quadrant pain: Secondary | ICD-10-CM | POA: Diagnosis not present

## 2012-03-16 DIAGNOSIS — E782 Mixed hyperlipidemia: Secondary | ICD-10-CM | POA: Diagnosis not present

## 2012-03-16 DIAGNOSIS — K5901 Slow transit constipation: Secondary | ICD-10-CM | POA: Diagnosis not present

## 2012-03-16 DIAGNOSIS — R0602 Shortness of breath: Secondary | ICD-10-CM | POA: Diagnosis not present

## 2012-03-16 DIAGNOSIS — R079 Chest pain, unspecified: Secondary | ICD-10-CM | POA: Diagnosis not present

## 2012-03-18 DIAGNOSIS — H18519 Endothelial corneal dystrophy, unspecified eye: Secondary | ICD-10-CM | POA: Diagnosis not present

## 2012-03-23 ENCOUNTER — Telehealth: Payer: Self-pay | Admitting: Pulmonary Disease

## 2012-03-23 NOTE — Telephone Encounter (Signed)
[id number] x1 

## 2012-03-24 NOTE — Telephone Encounter (Signed)
There is no echo report under "procedures".  It's status is listed as "future".  I do not have the report available to me.  Let the pt know that it has not come into our system, and will be happy to call her once the report is available. Can we track this down.

## 2012-03-24 NOTE — Telephone Encounter (Signed)
Pt is requesting results of ECHO done on 03-11-12. She wants the results before the holiday weekend. Please advise. Carron Curie, CMA

## 2012-03-24 NOTE — Telephone Encounter (Signed)
Spoke with [facility], [phone],  and they are researching this. Pt had the ECHO done there on 03/16/12 but they are not sure this has been read. I asked that they fax the results to triage at [phone]. Pt is aware we do not have results yet and will call her next week once Dr. Shelle Iron has reviewed. Pt verbalized understanding. I will forward this to Aundra Millet so she can follow-up on this next week.

## 2012-03-30 NOTE — Telephone Encounter (Signed)
Received results and put in [facility] VIP folder

## 2012-03-30 NOTE — Telephone Encounter (Signed)
Please let pt know that we just received her echo Shows normal heart function, no valve issues, and nothing to suggest pulmonary hypertension.  Good news. Would continue on current pulmonary medications, and work on conditioning program as we discussed.

## 2012-03-30 NOTE — Telephone Encounter (Signed)
Spoke with pt and notified of results/recs per KC. She verbalized understanding. States no questions. Nothing further needed.

## 2012-04-08 DIAGNOSIS — L97509 Non-pressure chronic ulcer of other part of unspecified foot with unspecified severity: Secondary | ICD-10-CM | POA: Diagnosis not present

## 2012-04-08 DIAGNOSIS — M0500 Felty's syndrome, unspecified site: Secondary | ICD-10-CM | POA: Diagnosis not present

## 2012-04-13 DIAGNOSIS — H18519 Endothelial corneal dystrophy, unspecified eye: Secondary | ICD-10-CM | POA: Diagnosis not present

## 2012-04-20 ENCOUNTER — Telehealth: Payer: Self-pay | Admitting: Pulmonary Disease

## 2012-04-20 ENCOUNTER — Ambulatory Visit (INDEPENDENT_AMBULATORY_CARE_PROVIDER_SITE_OTHER)
Admission: RE | Admit: 2012-04-20 | Discharge: 2012-04-20 | Disposition: A | Discharge status: Home / Self Care | Payer: Medicare Other | Source: Ambulatory Visit | Attending: Adult Health | Admitting: Adult Health

## 2012-04-20 ENCOUNTER — Encounter: Payer: Self-pay | Admitting: Adult Health

## 2012-04-20 ENCOUNTER — Ambulatory Visit (INDEPENDENT_AMBULATORY_CARE_PROVIDER_SITE_OTHER): Payer: Medicare Other | Admitting: Adult Health

## 2012-04-20 VITALS — BP 124/66 | HR 60 | Temp 97.4°F | Ht 64.5 in | Wt 165.0 lb

## 2012-04-20 DIAGNOSIS — J4489 Other specified chronic obstructive pulmonary disease: Secondary | ICD-10-CM

## 2012-04-20 DIAGNOSIS — R05 Cough: Secondary | ICD-10-CM | POA: Diagnosis not present

## 2012-04-20 DIAGNOSIS — J449 Chronic obstructive pulmonary disease, unspecified: Secondary | ICD-10-CM | POA: Diagnosis not present

## 2012-04-20 DIAGNOSIS — R079 Chest pain, unspecified: Secondary | ICD-10-CM | POA: Diagnosis not present

## 2012-04-20 DIAGNOSIS — R059 Cough, unspecified: Secondary | ICD-10-CM | POA: Diagnosis not present

## 2012-04-20 DIAGNOSIS — R0602 Shortness of breath: Secondary | ICD-10-CM | POA: Diagnosis not present

## 2012-04-20 MED ORDER — HYDROCODONE-HOMATROPINE 5-1.5 MG/5ML PO SYRP: 2.5000 mL | ORAL_SOLUTION | Freq: Every evening | ORAL | Status: AC | PRN

## 2012-04-20 MED ORDER — LEVALBUTEROL HCL 0.63 MG/3ML IN NEBU
0.6300 mg | INHALATION_SOLUTION | Freq: Once | RESPIRATORY_TRACT | Status: AC
  Administered 2012-04-20: 0.63 mg via RESPIRATORY_TRACT

## 2012-04-20 NOTE — Telephone Encounter (Signed)
I spoke with pt and she states her cough has gotten much worse x Monday and is having to use her inhalers more often. Pt is scheduled to come in and see PT this AM at 11:45 to reassess her cough,

## 2012-04-20 NOTE — Progress Notes (Signed)
  Subjective:    Patient ID: Lisa Vega, female    DOB: 08-06-1932, 78 y.o.   MRN: [id number]  HPI  52 yowf quit smoking 2004 s/p RUL lobectomy 10/2004 for [facility] with PFT's 2007 FEV1 72% with ratio 60   04/20/2012 Acute OV  Complains of dry cough, chest congestion, increased SOB, wheezing, tightness in chest x2 days  Post nasal drainage and tickle in throat.  No discolored mucus.  No hemoptysis or chest pain.  No edema.     PMH:  ARTHRITIS, RHEUMATOID (ICD-714.0)  DYSPNEA (ICD-786.05)  Hx of CARCINOMA, LUNG, NONSMALL CELL (ICD-162.9)  - s/p RLLobectomy with last chemo 03/2005 and no rt  COPD       Review of Systems  Constitutional:   No  weight loss, night sweats,  Fevers, chills, fatigue, or  lassitude.  HEENT:   No headaches,  Difficulty swallowing,  Tooth/dental problems, or  Sore throat,                No sneezing, itching, ear ache,  +nasal congestion, post nasal drip,   CV:  No chest pain,  Orthopnea, PND, swelling in lower extremities, anasarca, dizziness, palpitations, syncope.   GI  No heartburn, indigestion, abdominal pain, nausea, vomiting, diarrhea, change in bowel habits, loss of appetite, bloody stools.   Resp   No coughing up of blood.     No chest wall deformity  Skin: no rash or lesions.  GU: no dysuria, change in color of urine, no urgency or frequency.  No flank pain, no hematuria   MS:     No back pain.  Psych:  No change in mood or affect. No depression or anxiety.  No memory loss.         Objective:   Physical Exam  GEN: A/Ox3; pleasant , NAD, well nourished   HEENT:  [place]/AT,  EACs-clear, TMs-wnl, NOSE-clear drainage  THROAT-clear, no lesions, no postnasal drip or exudate noted.   NECK:  Supple w/ fair ROM; no JVD; normal carotid impulses w/o bruits; no thyromegaly or nodules palpated; no lymphadenopathy.  RESP  Coarse BS  w/o, wheezes/ rales/ or rhonchi.no accessory muscle use, no dullness to percussion  CARD:  RRR, no  m/r/g  , no peripheral edema, pulses intact, no cyanosis or clubbing.  GI:   Soft & nt; nml bowel sounds; no organomegaly or masses detected.  Musco: Warm bil, severe arthritic changes in hands   Neuro: alert, no focal deficits noted.    Skin: Warm, no lesions or rashes         Assessment & Plan:

## 2012-04-20 NOTE — Patient Instructions (Signed)
Delsym 2 tsp Twice daily  As needed  Cough.  Hydromet 1/2 tsp At bedtime  As needed  Cough, may make you sleepy . Claritin 10mg  At bedtime for 1 week.  Add Zantac 150mg  At bedtime  For 2 weeks .  I will call with xray results.  Continue on symbicort 160/4.1mcg 2 puffs Twice daily   Please contact office for sooner follow up if symptoms do not improve or worsen or seek emergency care  follow up Dr. Shelle Iron as planned and As needed

## 2012-04-22 NOTE — Assessment & Plan Note (Signed)
Mild flare with cough  Plan:   Delsym 2 tsp Twice daily  As needed  Cough.  Hydromet 1/2 tsp At bedtime  As needed  Cough, may make you sleepy . Claritin 10mg  At bedtime for 1 week.  Add Zantac 150mg  At bedtime  For 2 weeks .  I will call with xray results.  Continue on symbicort 160/4.31mcg 2 puffs Twice daily   Please contact office for sooner follow up if symptoms do not improve or worsen or seek emergency care  follow up Dr. Shelle Iron as planned and As needed

## 2012-04-25 ENCOUNTER — Telehealth: Payer: Self-pay | Admitting: Adult Health

## 2012-04-25 DIAGNOSIS — M2550 Pain in unspecified joint: Secondary | ICD-10-CM | POA: Diagnosis not present

## 2012-04-25 DIAGNOSIS — J40 Bronchitis, not specified as acute or chronic: Secondary | ICD-10-CM | POA: Diagnosis not present

## 2012-04-25 DIAGNOSIS — Z79899 Other long term (current) drug therapy: Secondary | ICD-10-CM | POA: Diagnosis not present

## 2012-04-25 DIAGNOSIS — M069 Rheumatoid arthritis, unspecified: Secondary | ICD-10-CM | POA: Diagnosis not present

## 2012-04-25 NOTE — Telephone Encounter (Signed)
I spoke with pt and she is scheduled to come in and see RB tomorrow at 2:00 for an evaluation.

## 2012-04-25 NOTE — Telephone Encounter (Signed)
She has been treated just as I would have at visit with TP, and is not better. This means that she needs ov so I can re-evaluate her, or with someone if i do not have a slot.

## 2012-04-25 NOTE — Telephone Encounter (Signed)
I spoke with pt and she states she is still not feeling better from when she came in and saw TP on 04/20/12. She is still have a dry cough, still SOB, chest congestion, chest tx, wheezing, head congestion. Denies any f/c/s/n/v. Pt is taking the hydromet cough syrup, has already been through 1 bottle of delsym, zanact 150 mg at bedtime, Claritin 10 mg at bedtime, and symbicort 160 2 puffs BID. Pt does not want to come in for OV and is requesting recs from [facility]. Marland Kitchen Please advise kc thanks  Allergies  Allergen Reactions  . Aspirin   . Codeine     REACTION: hallucinations  . Demerol   . Meperidine Hcl     REACTION: severe GI upset

## 2012-04-25 NOTE — Telephone Encounter (Signed)
Please call pt back at [phone] Lisa Vega

## 2012-04-26 ENCOUNTER — Encounter: Payer: Self-pay | Admitting: Emergency Medicine

## 2012-04-26 ENCOUNTER — Telehealth: Payer: Self-pay | Admitting: Emergency Medicine

## 2012-04-26 ENCOUNTER — Ambulatory Visit (INDEPENDENT_AMBULATORY_CARE_PROVIDER_SITE_OTHER): Payer: Medicare Other | Admitting: Emergency Medicine

## 2012-04-26 VITALS — BP 122/68 | HR 67 | Temp 97.9°F | Ht 64.5 in | Wt 163.6 lb

## 2012-04-26 DIAGNOSIS — J449 Chronic obstructive pulmonary disease, unspecified: Secondary | ICD-10-CM

## 2012-04-26 MED ORDER — DOXYCYCLINE HYCLATE 100 MG PO TABS: 100.0000 mg | ORAL_TABLET | Freq: Two times a day (BID) | ORAL | Status: AC

## 2012-04-26 MED ORDER — PREDNISONE 10 MG PO TABS: ORAL_TABLET | ORAL | Status: DC

## 2012-04-26 MED ORDER — DOXYCYCLINE HYCLATE 50 MG PO CAPS: 100.0000 mg | ORAL_CAPSULE | Freq: Two times a day (BID) | ORAL | Status: DC

## 2012-04-26 NOTE — Progress Notes (Signed)
Subjective:    Patient ID: Lisa Vega, female    DOB: 07/15/1932, 78 y.o.   MRN: [id number] HPI 73 yowf quit smoking 2004 s/p RUL lobectomy 10/2004 for [facility] ca with PFT's 2007 FEV1 72% with ratio 60   04/20/2012 Acute OV  Complains of dry cough, chest congestion, increased SOB, wheezing, tightness in chest x2 days  Post nasal drainage and tickle in throat.  No discolored mucus.  No hemoptysis or chest pain.  No edema.   Acute visit 04/26/12 -- 78 yo woman, hx RUL lobectomy for NSCLCA, also w moderate COPD, RA on MTX. Was seen by TP 4/24 for cough, chest tightness, green sputum and nasal drainage- started loratadine, hydromet, zantac (not taking). Was continued on symbicort. Now returns with dyspnea, tight chest, wheeze and rattling. Using SABA freq, seems to help her breathing.  Has had sweats at night. Cough produces green.   Past Medical History  Diagnosis Date  . Cancer     lung ca  . COPD (chronic obstructive pulmonary disease)   . Dyspnea   . Rheumatoid arthritis      Family History  Problem Relation Age of Onset  . Emphysema Mother   . Lung cancer Father   . Heart disease Brother     x1  . Heart disease Sister     x2  . Asthma Mother   . Pancreatic cancer Father   . Bone cancer Father      History   Social History  . Marital Status: Married    Spouse Name: N/A    Number of Children: N/A  . Years of Education: N/A   Occupational History  . retired     Public house manager at [employer]   Social History Main Topics  . Smoking status: Former Smoker -- 1.0 packs/day for 20 years    Types: Cigarettes    Quit date: 12/28/2002  . Smokeless tobacco: Not on file  . Alcohol Use: No  . Drug Use: No  . Sexually Active: Not on file   Other Topics Concern  . Not on file   Social History Narrative  . No narrative on file     Allergies  Allergen Reactions  . Aspirin   . Codeine     REACTION: hallucinations  . Demerol   . Meperidine Hcl     REACTION:  severe GI upset     Outpatient Prescriptions Prior to Visit  Medication Sig Dispense Refill  . albuterol (PROVENTIL HFA;VENTOLIN HFA) 108 (90 BASE) MCG/ACT inhaler Inhale 2 puffs into the lungs every 4 (four) hours as needed.      . budesonide-formoterol (SYMBICORT) 80-4.5 MCG/ACT inhaler Inhale 2 puffs into the lungs 2 (two) times daily.       . diazepam (VALIUM) 5 MG tablet 1/2-1 tablet once a day as needed      . diclofenac sodium (VOLTAREN) 1 % GEL Twice per week      . esomeprazole (NEXIUM) 40 MG capsule Take 40 mg by mouth daily before breakfast.      . folic acid (FOLVITE) 1 MG tablet Take 1 mg by mouth daily.      Marland Kitchen HYDROcodone-homatropine (HYDROMET) 5-1.5 MG/5ML syrup Take 2.5 mLs by mouth at bedtime as needed for cough.  240 mL  0  . levothyroxine (SYNTHROID, LEVOTHROID) 88 MCG tablet Take 88 mcg by mouth daily.      . meclizine (ANTIVERT) 12.5 MG tablet Take 12.5 mg by mouth as needed.      Marland Kitchen  methotrexate 25 MG/ML SOLN 1 injection weekly      . sertraline (ZOLOFT) 50 MG tablet Take 25 mg by mouth daily.            Objective:   Physical Exam  GEN: A/Ox3; pleasant , NAD, well nourished   HEENT:  [place]/AT,  EACs-clear, TMs-wnl, NOSE-clear drainage  THROAT-clear, no lesions, no postnasal drip or exudate noted.   NECK:  Supple w/ fair ROM; no JVD; normal carotid impulses w/o bruits; no thyromegaly or nodules palpated; no lymphadenopathy.  RESP  Coarse BS bilaterally, low-pitched exp wheeze and rhonchi, lots of referred UA noiseno accessory muscle use, no dullness to percussion  CARD:  RRR, no m/r/g  , no peripheral edema, pulses intact, no cyanosis or clubbing.  Musco: Warm bil, severe arthritic changes in hands   Neuro: alert, no focal deficits noted.    Skin: Warm, no lesions or rashes      Assessment & Plan:  COPD (chronic obstructive pulmonary disease) Probable AE of COPD + UA irritation. Failed conservative therapy - pred taper + doxy - rov 1 month w KC

## 2012-04-26 NOTE — Assessment & Plan Note (Signed)
Probable AE of COPD + UA irritation. Failed conservative therapy - pred taper + doxy - rov 1 month w KC

## 2012-04-26 NOTE — Patient Instructions (Signed)
Take prednisone taper as directed Take doxycycline as directed Stay on your Symbicort and other medications as you are taking them Follow with Dr Shelle Iron in 1 month Call our office if you are not doing better in 5 - 7 days.

## 2012-04-26 NOTE — Telephone Encounter (Signed)
The pharmacy does not have doxycyline 50 mg in stock only the 100 mg. Directions for pt's doxy 50 mg was for 2 tablets BID. Please advise RB thanks

## 2012-04-26 NOTE — Progress Notes (Signed)
  Subjective:    Patient ID: Lisa Vega, female    DOB: Nov 20, 1932, 78 y.o.   MRN: [id number]  HPI    Review of Systems  HENT: Positive for sore throat and postnasal drip.   Eyes: Negative.   Respiratory: Positive for cough, chest tightness, shortness of breath and wheezing.   Cardiovascular: Negative.   Gastrointestinal: Negative.   Genitourinary: Negative.   Musculoskeletal: Negative.   Skin: Negative.   Neurological: Negative.   Hematological: Negative.   Psychiatric/Behavioral: Negative.        Objective:   Physical Exam        Assessment & Plan:

## 2012-04-26 NOTE — Telephone Encounter (Signed)
Pharmacy aware that they can dispense the 100 mg and instructions should read take 1 tablet twice daily for 7 days per Dr. Delton Coombes.

## 2012-05-11 DIAGNOSIS — N8189 Other female genital prolapse: Secondary | ICD-10-CM | POA: Diagnosis not present

## 2012-05-19 ENCOUNTER — Telehealth: Payer: Self-pay | Admitting: Pulmonary Disease

## 2012-05-19 MED ORDER — BUDESONIDE-FORMOTEROL FUMARATE 160-4.5 MCG/ACT IN AERO: 2.0000 | INHALATION_SPRAY | Freq: Two times a day (BID) | RESPIRATORY_TRACT | Status: DC

## 2012-05-19 NOTE — Telephone Encounter (Signed)
I spoke with pt and she states she needed her new symbicort 160 rx sent to [facility] since she is almost finished with the samples she was given at last visit. I advised pt will send rx and nothing further was needed

## 2012-06-21 ENCOUNTER — Ambulatory Visit: Payer: Medicare Other | Admitting: Pulmonary Disease

## 2012-06-24 ENCOUNTER — Telehealth: Payer: Self-pay | Admitting: Medical Oncology

## 2012-06-24 DIAGNOSIS — J040 Acute laryngitis: Secondary | ICD-10-CM | POA: Diagnosis not present

## 2012-06-24 DIAGNOSIS — J329 Chronic sinusitis, unspecified: Secondary | ICD-10-CM | POA: Diagnosis not present

## 2012-06-24 DIAGNOSIS — E039 Hypothyroidism, unspecified: Secondary | ICD-10-CM | POA: Diagnosis not present

## 2012-06-24 DIAGNOSIS — E785 Hyperlipidemia, unspecified: Secondary | ICD-10-CM | POA: Diagnosis not present

## 2012-06-24 NOTE — Telephone Encounter (Signed)
I referred pt to her primary care Dr Holley Bouche for her symptoms.

## 2012-06-26 DIAGNOSIS — B3731 Acute candidiasis of vulva and vagina: Secondary | ICD-10-CM | POA: Diagnosis not present

## 2012-06-26 DIAGNOSIS — N390 Urinary tract infection, site not specified: Secondary | ICD-10-CM | POA: Diagnosis not present

## 2012-06-26 DIAGNOSIS — B373 Candidiasis of vulva and vagina: Secondary | ICD-10-CM | POA: Diagnosis not present

## 2012-06-26 DIAGNOSIS — N949 Unspecified condition associated with female genital organs and menstrual cycle: Secondary | ICD-10-CM | POA: Diagnosis not present

## 2012-07-08 DIAGNOSIS — R351 Nocturia: Secondary | ICD-10-CM | POA: Diagnosis not present

## 2012-07-08 DIAGNOSIS — N393 Stress incontinence (female) (male): Secondary | ICD-10-CM | POA: Diagnosis not present

## 2012-07-08 DIAGNOSIS — N8110 Cystocele, unspecified: Secondary | ICD-10-CM | POA: Diagnosis not present

## 2012-07-08 DIAGNOSIS — N816 Rectocele: Secondary | ICD-10-CM | POA: Diagnosis not present

## 2012-07-13 ENCOUNTER — Ambulatory Visit: Payer: Medicare Other | Admitting: Pulmonary Disease

## 2012-07-19 DIAGNOSIS — N8189 Other female genital prolapse: Secondary | ICD-10-CM | POA: Diagnosis not present

## 2012-07-25 DIAGNOSIS — M2550 Pain in unspecified joint: Secondary | ICD-10-CM | POA: Diagnosis not present

## 2012-07-25 DIAGNOSIS — Z79899 Other long term (current) drug therapy: Secondary | ICD-10-CM | POA: Diagnosis not present

## 2012-07-25 DIAGNOSIS — M069 Rheumatoid arthritis, unspecified: Secondary | ICD-10-CM | POA: Diagnosis not present

## 2012-08-05 ENCOUNTER — Ambulatory Visit: Payer: Medicare Other | Admitting: Pulmonary Disease

## 2012-08-31 ENCOUNTER — Ambulatory Visit: Payer: Medicare Other | Admitting: Pulmonary Disease

## 2012-09-06 ENCOUNTER — Ambulatory Visit (INDEPENDENT_AMBULATORY_CARE_PROVIDER_SITE_OTHER): Payer: Medicare Other | Admitting: Pulmonary Disease

## 2012-09-06 ENCOUNTER — Encounter: Payer: Self-pay | Admitting: Pulmonary Disease

## 2012-09-06 VITALS — BP 132/78 | HR 65 | Temp 98.1°F | Ht 64.0 in | Wt 162.6 lb

## 2012-09-06 DIAGNOSIS — Z23 Encounter for immunization: Secondary | ICD-10-CM

## 2012-09-06 DIAGNOSIS — J449 Chronic obstructive pulmonary disease, unspecified: Secondary | ICD-10-CM | POA: Diagnosis not present

## 2012-09-06 NOTE — Patient Instructions (Addendum)
Continue on symbicort 2 puffs am and pm everyday.  Keep mouth rinsed. Trial of spiriva one inhalation each am.  If helps after 4 weeks, let us know and we can call in prescription. Will refer to pulmonary rehab program at [place]. Stop claritin, and try chlorpheniramine 4mg  otc 2 tabs at bedtime each night.  followup with me in 6 mos if doing well, but call if having worsening breathing issues.

## 2012-09-06 NOTE — Addendum Note (Signed)
Addended by: Ozella Almond R on: 09/06/2012 03:15 PM   Modules accepted: Orders

## 2012-09-06 NOTE — Assessment & Plan Note (Signed)
The patient overall is near her usual baseline, but is having some day-to-day variability.  She is staying with symbicort compliantly, but I would add as a trial Spiriva given her air trapping symptoms.  I have also recommended that she attend pulmonary rehabilitation in order to improve conditioning and endurance.

## 2012-09-06 NOTE — Progress Notes (Signed)
  Subjective:    Patient ID: Lisa Vega, female    DOB: 08-Nov-1932, 78 y.o.   MRN: [id number]  HPI Patient comes in today for followup of her known moderate COPD.  She had an acute exacerbation earlier in the year, but resolved with antibiotics and prednisone.  Since that time, her exertional tolerance has been near her usual baseline, but she does have good and bad days.  I have told her this is not unusual with underlying COPD.  She has been compliant with her bronchodilator regimen, but is describing symptoms of air trapping at times.  She also has a cough that is clearly from her upper airway, and admits to having postnasal drip and a globus sensation.   Review of Systems  Constitutional: Negative for fever and unexpected weight change.  HENT: Positive for congestion, sore throat, rhinorrhea and sinus pressure. Negative for ear pain, nosebleeds, sneezing, trouble swallowing, dental problem and postnasal drip.   Eyes: Negative for redness and itching.  Respiratory: Positive for cough and shortness of breath. Negative for chest tightness and wheezing.   Cardiovascular: Positive for leg swelling. Negative for palpitations.  Gastrointestinal: Negative for nausea and vomiting.  Genitourinary: Negative for dysuria.  Musculoskeletal: Positive for joint swelling.  Skin: Negative for rash.  Neurological: Negative for headaches.  Hematological: Does not bruise/bleed easily.  Psychiatric/Behavioral: Positive for dysphoric mood. The patient is nervous/anxious.        Objective:   Physical Exam Well-developed female in no acute distress Nose without purulence or discharge noted Chest with mild decrease in breath sounds, no wheezes or rhonchi Cardiac exam is regular rate and rhythm Lower extremity without significant edema, no cyanosis Alert and oriented, moves all 4 extremities.       Assessment & Plan:

## 2012-09-23 DIAGNOSIS — N949 Unspecified condition associated with female genital organs and menstrual cycle: Secondary | ICD-10-CM | POA: Diagnosis not present

## 2012-09-30 ENCOUNTER — Telehealth: Payer: Self-pay | Admitting: Pulmonary Disease

## 2012-09-30 MED ORDER — PREDNISONE 10 MG PO TABS: ORAL_TABLET | ORAL | Status: DC

## 2012-09-30 NOTE — Telephone Encounter (Signed)
Pt c/o increased sob, weakness, cough with clear sputum, low grade fever at times and hoarseness. She says this has gotten worse over the past few days. She also says that she was recently on an abx for diverticulitis but could not remember the name of the medication. KC, pls advise. Pt last seen on 09/06/12. Allergies  Allergen Reactions  . Aspirin   . Codeine     REACTION: hallucinations  . Demerol   . Meperidine Hcl     REACTION: severe GI upset

## 2012-09-30 NOTE — Telephone Encounter (Signed)
Pt called back. She says the phone "cut off" when nurse was explaining things to her. Please call her at home # [phone]. Lisa Vega

## 2012-09-30 NOTE — Telephone Encounter (Signed)
We can call in a course of prednisone for her breathing, but would like to hold off on abx unless she is producing significant quantities of nasty mucus. Prednisone 10mg :  4 qd for 2 days, then 3 qd for 2 days, then 2 qd for 2 days, then one qd for 2 days, then stop Let us know if she gets worse.

## 2012-09-30 NOTE — Telephone Encounter (Signed)
Pt aware we will get the msg to [facility] and call her back once he responds with recs.

## 2012-09-30 NOTE — Telephone Encounter (Signed)
Spoke with patient in regards to [facility] recs. Prednisone 10mg  taper being phoned into [facility]. Pred taper: 4 qd for 2 days, then 3 qd for 2 days, then 2 qd for 2 days, then one qd for 2 days, then stop.  #20  X 0-RF.  Patient states that she is currently taking 2 abx for her diverticulitis---Cipro 500mg  and Metronidazole 500mg --should she stay on these abx while taking the Pred? Per KC verbal recs--okay to stay on abx from Dr Nicholas Lose and take the Pred in addition as rx'd from KC-this may actually help clear up her symptoms even quicker. Patient aware to call if anything further needed.   Will sign off on message.  Seward Meth, CMA 09/30/12

## 2012-10-03 DIAGNOSIS — K5732 Diverticulitis of large intestine without perforation or abscess without bleeding: Secondary | ICD-10-CM | POA: Diagnosis not present

## 2012-10-03 DIAGNOSIS — R7989 Other specified abnormal findings of blood chemistry: Secondary | ICD-10-CM | POA: Diagnosis not present

## 2012-10-05 ENCOUNTER — Other Ambulatory Visit: Payer: Self-pay | Admitting: Gynecology

## 2012-10-05 DIAGNOSIS — K5792 Diverticulitis of intestine, part unspecified, without perforation or abscess without bleeding: Secondary | ICD-10-CM

## 2012-10-06 ENCOUNTER — Other Ambulatory Visit: Payer: Medicare Other

## 2012-10-06 ENCOUNTER — Ambulatory Visit
Admission: RE | Admit: 2012-10-06 | Discharge: 2012-10-06 | Disposition: A | Discharge status: Home / Self Care | Payer: Medicare Other | Source: Ambulatory Visit | Attending: Gynecology | Admitting: Gynecology

## 2012-10-06 DIAGNOSIS — R109 Unspecified abdominal pain: Secondary | ICD-10-CM | POA: Diagnosis not present

## 2012-10-06 DIAGNOSIS — K5792 Diverticulitis of intestine, part unspecified, without perforation or abscess without bleeding: Secondary | ICD-10-CM

## 2012-10-06 MED ORDER — IOHEXOL 300 MG/ML  SOLN
100.0000 mL | Freq: Once | INTRAMUSCULAR | Status: AC | PRN
  Administered 2012-10-06: 100 mL via INTRAVENOUS

## 2012-10-06 MED ORDER — IOHEXOL 300 MG/ML  SOLN: 10.0000 mL | Freq: Once | INTRAMUSCULAR | Status: AC | PRN

## 2012-10-10 ENCOUNTER — Telehealth: Payer: Self-pay | Admitting: Pulmonary Disease

## 2012-10-10 MED ORDER — CEFDINIR 300 MG PO CAPS: ORAL_CAPSULE | ORAL | Status: DC

## 2012-10-10 NOTE — Telephone Encounter (Signed)
Can call in omnicef 300mg   2 each am for next 5 days.  If she is getting worse, needs ov.  If she doesn't improve with this, needs. Ov.

## 2012-10-10 NOTE — Telephone Encounter (Signed)
Pt advised and rx sent. Jennifer Castillo, CMA  

## 2012-10-10 NOTE — Telephone Encounter (Signed)
Last OV 09-06-12. I spoke with the pt and she is c/o having a fever of 100.8, dry mouth, chest congestion, productive cough with green phlegm, increased SOB at rest and with activity, wheezing and chest tightness all x 2 days. The pt states she is also very fatigued and stated she could not come in for an appt.Pt is requesting rx be called in, she has an assistant that can pick-up the rx for her. Please advise.Carron Curie, CMA   Allergies  Allergen Reactions  . Aspirin   . Codeine     REACTION: hallucinations  . Demerol   . Meperidine Hcl     REACTION: severe GI upset  \

## 2012-10-12 DIAGNOSIS — H18519 Endothelial corneal dystrophy, unspecified eye: Secondary | ICD-10-CM | POA: Diagnosis not present

## 2012-10-12 DIAGNOSIS — G43809 Other migraine, not intractable, without status migrainosus: Secondary | ICD-10-CM | POA: Diagnosis not present

## 2012-10-13 DIAGNOSIS — J441 Chronic obstructive pulmonary disease with (acute) exacerbation: Secondary | ICD-10-CM | POA: Diagnosis not present

## 2012-10-13 DIAGNOSIS — R5381 Other malaise: Secondary | ICD-10-CM | POA: Diagnosis not present

## 2012-10-13 DIAGNOSIS — G479 Sleep disorder, unspecified: Secondary | ICD-10-CM | POA: Diagnosis not present

## 2012-10-13 DIAGNOSIS — R5383 Other fatigue: Secondary | ICD-10-CM | POA: Diagnosis not present

## 2012-10-17 DIAGNOSIS — Z79899 Other long term (current) drug therapy: Secondary | ICD-10-CM | POA: Diagnosis not present

## 2012-10-17 DIAGNOSIS — K5732 Diverticulitis of large intestine without perforation or abscess without bleeding: Secondary | ICD-10-CM | POA: Diagnosis not present

## 2012-10-17 DIAGNOSIS — J449 Chronic obstructive pulmonary disease, unspecified: Secondary | ICD-10-CM | POA: Diagnosis not present

## 2012-10-17 DIAGNOSIS — M069 Rheumatoid arthritis, unspecified: Secondary | ICD-10-CM | POA: Diagnosis not present

## 2012-10-17 DIAGNOSIS — M542 Cervicalgia: Secondary | ICD-10-CM | POA: Diagnosis not present

## 2012-10-17 DIAGNOSIS — M2550 Pain in unspecified joint: Secondary | ICD-10-CM | POA: Diagnosis not present

## 2012-11-16 DIAGNOSIS — N8189 Other female genital prolapse: Secondary | ICD-10-CM | POA: Diagnosis not present

## 2012-12-22 DIAGNOSIS — M5030 Other cervical disc degeneration, unspecified cervical region: Secondary | ICD-10-CM | POA: Diagnosis not present

## 2012-12-22 DIAGNOSIS — IMO0002 Reserved for concepts with insufficient information to code with codable children: Secondary | ICD-10-CM | POA: Diagnosis not present

## 2012-12-22 DIAGNOSIS — M1710 Unilateral primary osteoarthritis, unspecified knee: Secondary | ICD-10-CM | POA: Diagnosis not present

## 2013-01-09 DIAGNOSIS — L821 Other seborrheic keratosis: Secondary | ICD-10-CM | POA: Diagnosis not present

## 2013-01-09 DIAGNOSIS — E039 Hypothyroidism, unspecified: Secondary | ICD-10-CM | POA: Diagnosis not present

## 2013-01-09 DIAGNOSIS — H04129 Dry eye syndrome of unspecified lacrimal gland: Secondary | ICD-10-CM | POA: Diagnosis not present

## 2013-01-09 DIAGNOSIS — E785 Hyperlipidemia, unspecified: Secondary | ICD-10-CM | POA: Diagnosis not present

## 2013-01-09 DIAGNOSIS — D649 Anemia, unspecified: Secondary | ICD-10-CM | POA: Diagnosis not present

## 2013-01-09 DIAGNOSIS — K219 Gastro-esophageal reflux disease without esophagitis: Secondary | ICD-10-CM | POA: Diagnosis not present

## 2013-01-16 ENCOUNTER — Encounter: Payer: Self-pay | Admitting: Adult Health

## 2013-01-16 ENCOUNTER — Ambulatory Visit (INDEPENDENT_AMBULATORY_CARE_PROVIDER_SITE_OTHER)
Admission: RE | Admit: 2013-01-16 | Discharge: 2013-01-16 | Disposition: A | Discharge status: Home / Self Care | Payer: Medicare Other | Source: Ambulatory Visit | Attending: Adult Health | Admitting: Adult Health

## 2013-01-16 ENCOUNTER — Ambulatory Visit (INDEPENDENT_AMBULATORY_CARE_PROVIDER_SITE_OTHER): Payer: Medicare Other | Admitting: Adult Health

## 2013-01-16 VITALS — BP 132/60 | HR 80 | Temp 98.1°F | Ht 64.5 in | Wt 164.4 lb

## 2013-01-16 DIAGNOSIS — J449 Chronic obstructive pulmonary disease, unspecified: Secondary | ICD-10-CM

## 2013-01-16 DIAGNOSIS — R091 Pleurisy: Secondary | ICD-10-CM | POA: Diagnosis not present

## 2013-01-16 MED ORDER — LEVALBUTEROL HCL 0.63 MG/3ML IN NEBU
0.6300 mg | INHALATION_SOLUTION | Freq: Once | RESPIRATORY_TRACT | Status: AC
  Administered 2013-01-16: 0.63 mg via RESPIRATORY_TRACT

## 2013-01-16 MED ORDER — PREDNISONE 10 MG PO TABS: ORAL_TABLET | ORAL | Status: DC

## 2013-01-16 NOTE — Progress Notes (Signed)
  Subjective:    Patient ID: Lisa Vega, female    DOB: 1932-04-24, 79 y.o.   MRN: [id number] HPI 55 yowf quit smoking 2004 s/p RUL lobectomy 10/2004 for [facility] ca with PFT's 2007 FEV1 72% with ratio 60  Hx of RA on MTX.  01/16/13 Acute OV  Complains of increased SOB, tightness in chest, occasional wheezing x2 weeks .  No discolored mucus. Cough is mainly dry and is minimal  No fever or edema.  No recent travel or abx use.  OTC meds not helping.      ROS: Constitutional:   No  weight loss, night sweats,  Fevers, chills,  +fatigue, or  lassitude.  HEENT:   No headaches,  Difficulty swallowing,  Tooth/dental problems, or  Sore throat,                No sneezing, itching, ear ache,  +nasal congestion, post nasal drip,   CV:  No chest pain,  Orthopnea, PND, swelling in lower extremities, anasarca, dizziness, palpitations, syncope.   GI  No heartburn, indigestion, abdominal pain, nausea, vomiting, diarrhea, change in bowel habits, loss of appetite, bloody stools.   Resp:    No coughing up of blood.  No change in color of mucus.   No chest wall deformity  Skin: no rash or lesions.  GU: no dysuria, change in color of urine, no urgency or frequency.  No flank pain, no hematuria   MS:  No joint pain or swelling.  No decreased range of motion.  No back pain.  Psych:  No change in mood or affect. No depression or anxiety.  No memory loss.          Objective:   Physical Exam  GEN: A/Ox3; pleasant , NAD elderly   HEENT:  [place]/AT,  EACs-clear, TMs-wnl, NOSE-clear drainage  THROAT-clear, no lesions, no postnasal drip or exudate noted.   NECK:  Supple w/ fair ROM; no JVD; normal carotid impulses w/o bruits; no thyromegaly or nodules palpated; no lymphadenopathy.  RESP  Few exp wheezes noted. No  accessory muscle use, no dullness to percussion  CARD:  RRR, no m/r/g  , no peripheral edema, pulses intact, no cyanosis or clubbing.  Musco: Warm bil, severe arthritic changes in  hands   Neuro: alert, no focal deficits noted.    Skin: Warm, no lesions or rashes      Assessment & Plan:

## 2013-01-16 NOTE — Patient Instructions (Addendum)
Prednisone taper over next week .  Continue on Symbicort 2 puffs Twice daily   Please contact office for sooner follow up if symptoms do not improve or worsen or seek emergency care  Follow up Dr. Shelle Iron in 2 months as planned  Will re-refer to pulmonary rehab.

## 2013-01-18 ENCOUNTER — Telehealth: Payer: Self-pay | Admitting: Pulmonary Disease

## 2013-01-18 NOTE — Progress Notes (Signed)
Quick Note:  Pt aware of cxr results per 1.22.14 phone note. ______

## 2013-01-18 NOTE — Telephone Encounter (Signed)
It has only been 2 days!  Needs to try hard to stay on treatment, including prednisone.  If her breathing worsens, let us know.

## 2013-01-18 NOTE — Telephone Encounter (Signed)
Ok to change prednisone to 2 each day for 4 days, then one each day for 2 days, then stop/

## 2013-01-18 NOTE — Telephone Encounter (Signed)
Result Note     No acute changes    Cont w/ ov recs   Called spoke with patient who reported that she does not tolerated high doses of prednisone was having significant lightheadedness with the increased dose of 40mg  w/ taper given at 1.20.14 ov w/ TP: Patient Instructions     Prednisone taper over next week .  Continue on Symbicort 2 puffs Twice daily  Please contact office for sooner follow up if symptoms do not improve or worsen or seek emergency care  Follow up Dr. Shelle Iron in 2 months as planned  Will re-refer to pulmonary rehab.    Pt stated that while her wheezing is slightly improved, her SOB and tightness are not.  She did not sleep well last night > went to sleep at approx 3am sitting in a chair but SOB still kept her from sleeping well.  Her ventolin hfa only helps marginally.  Doesn't feel like the symbicort is working effectively as she is having difficulty taking deep breaths.  Does not feel another ov is necessary.  TP not in office today or tomorrow; will forward to [facility] for recs.  Pt aware to call of seek emergency assistance if her breathing worsens before hearing back from our office.  Dr Shelle Iron please advise, thanks. [facility] Allergies  Allergen Reactions  . Aspirin   . Codeine     REACTION: hallucinations  . Demerol   . Meperidine Hcl     REACTION: severe GI upset

## 2013-01-18 NOTE — Telephone Encounter (Signed)
Spoke with pt She states does not mind taking prednisone, but wants the dose lowered She states unable to tolerate what she is on now b/c it causes her to feel lightheaded and jittery KC, please advise thanks!

## 2013-01-18 NOTE — Telephone Encounter (Signed)
Spoke with pt and notified of recs per KC She verbalized understanding and states nothing further needed 

## 2013-01-20 NOTE — Assessment & Plan Note (Signed)
Flare  Check xray today   Plan  Prednisone taper over next week .  Continue on Symbicort 2 puffs Twice daily   Please contact office for sooner follow up if symptoms do not improve or worsen or seek emergency care  Follow up Dr. Shelle Iron in 2 months as planned  Will re-refer to pulmonary rehab.

## 2013-01-20 NOTE — Progress Notes (Signed)
Ov reviewed, and agree with plan as outlined.  

## 2013-01-23 DIAGNOSIS — M1990 Unspecified osteoarthritis, unspecified site: Secondary | ICD-10-CM | POA: Diagnosis not present

## 2013-01-23 DIAGNOSIS — M2550 Pain in unspecified joint: Secondary | ICD-10-CM | POA: Diagnosis not present

## 2013-01-23 DIAGNOSIS — Z79899 Other long term (current) drug therapy: Secondary | ICD-10-CM | POA: Diagnosis not present

## 2013-01-23 DIAGNOSIS — M069 Rheumatoid arthritis, unspecified: Secondary | ICD-10-CM | POA: Diagnosis not present

## 2013-01-27 DIAGNOSIS — N949 Unspecified condition associated with female genital organs and menstrual cycle: Secondary | ICD-10-CM | POA: Diagnosis not present

## 2013-01-27 DIAGNOSIS — R109 Unspecified abdominal pain: Secondary | ICD-10-CM | POA: Diagnosis not present

## 2013-01-30 DIAGNOSIS — N949 Unspecified condition associated with female genital organs and menstrual cycle: Secondary | ICD-10-CM | POA: Diagnosis not present

## 2013-02-27 ENCOUNTER — Ambulatory Visit (HOSPITAL_COMMUNITY): Payer: Medicare Other | Admitting: Physical Therapy

## 2013-03-07 ENCOUNTER — Ambulatory Visit: Payer: Medicare Other | Admitting: Pulmonary Disease

## 2013-03-09 ENCOUNTER — Ambulatory Visit (HOSPITAL_COMMUNITY)
Admission: RE | Admit: 2013-03-09 | Discharge: 2013-03-09 | Disposition: A | Discharge status: Home / Self Care | Payer: Medicare Other | Source: Ambulatory Visit | Attending: Physical Medicine and Rehabilitation | Admitting: Physical Medicine and Rehabilitation

## 2013-03-09 ENCOUNTER — Ambulatory Visit: Payer: Self-pay | Admitting: Gynecology

## 2013-03-09 ENCOUNTER — Encounter: Payer: Self-pay | Admitting: Gynecology

## 2013-03-09 VITALS — BP 132/76 | Ht 62.5 in | Wt 162.0 lb

## 2013-03-09 DIAGNOSIS — Z4689 Encounter for fitting and adjustment of other specified devices: Secondary | ICD-10-CM

## 2013-03-09 DIAGNOSIS — N814 Uterovaginal prolapse, unspecified: Secondary | ICD-10-CM | POA: Diagnosis not present

## 2013-03-09 DIAGNOSIS — M6281 Muscle weakness (generalized): Secondary | ICD-10-CM | POA: Diagnosis not present

## 2013-03-09 DIAGNOSIS — J449 Chronic obstructive pulmonary disease, unspecified: Secondary | ICD-10-CM | POA: Diagnosis not present

## 2013-03-09 DIAGNOSIS — M25519 Pain in unspecified shoulder: Secondary | ICD-10-CM | POA: Insufficient documentation

## 2013-03-09 DIAGNOSIS — N952 Postmenopausal atrophic vaginitis: Secondary | ICD-10-CM | POA: Insufficient documentation

## 2013-03-09 DIAGNOSIS — M542 Cervicalgia: Secondary | ICD-10-CM | POA: Insufficient documentation

## 2013-03-09 DIAGNOSIS — IMO0002 Reserved for concepts with insufficient information to code with codable children: Secondary | ICD-10-CM

## 2013-03-09 DIAGNOSIS — IMO0001 Reserved for inherently not codable concepts without codable children: Secondary | ICD-10-CM

## 2013-03-09 DIAGNOSIS — J4489 Other specified chronic obstructive pulmonary disease: Secondary | ICD-10-CM | POA: Insufficient documentation

## 2013-03-09 DIAGNOSIS — R350 Frequency of micturition: Secondary | ICD-10-CM | POA: Diagnosis not present

## 2013-03-09 DIAGNOSIS — R102 Pelvic and perineal pain: Secondary | ICD-10-CM | POA: Insufficient documentation

## 2013-03-09 DIAGNOSIS — N393 Stress incontinence (female) (male): Secondary | ICD-10-CM | POA: Diagnosis not present

## 2013-03-09 DIAGNOSIS — N816 Rectocele: Secondary | ICD-10-CM | POA: Insufficient documentation

## 2013-03-09 DIAGNOSIS — R32 Unspecified urinary incontinence: Secondary | ICD-10-CM | POA: Insufficient documentation

## 2013-03-09 LAB — URINALYSIS W MICROSCOPIC + REFLEX CULTURE
Bilirubin Urine: NEGATIVE
Casts: NONE SEEN
Crystals: NONE SEEN
Glucose, UA: NEGATIVE mg/dL
Ketones, ur: NEGATIVE mg/dL
Nitrite: NEGATIVE
Protein, ur: NEGATIVE mg/dL
Specific Gravity, Urine: 1.015 (ref 1.005–1.030)
Urobilinogen, UA: 0.2 mg/dL (ref 0.0–1.0)
pH: 5 (ref 5.0–8.0)

## 2013-03-09 MED ORDER — NONFORMULARY OR COMPOUNDED ITEM: Status: DC

## 2013-03-09 NOTE — Patient Instructions (Signed)

## 2013-03-10 ENCOUNTER — Other Ambulatory Visit: Payer: Self-pay | Admitting: Gynecology

## 2013-03-10 DIAGNOSIS — M542 Cervicalgia: Secondary | ICD-10-CM | POA: Insufficient documentation

## 2013-03-10 LAB — URINE CULTURE
Colony Count: NO GROWTH
Organism ID, Bacteria: NO GROWTH

## 2013-03-10 MED ORDER — PHENAZOPYRIDINE HCL 200 MG PO TABS: 200.0000 mg | ORAL_TABLET | Freq: Three times a day (TID) | ORAL | Status: DC | PRN

## 2013-03-10 MED ORDER — NITROFURANTOIN MONOHYD MACRO 100 MG PO CAPS: 100.0000 mg | ORAL_CAPSULE | Freq: Two times a day (BID) | ORAL | Status: DC

## 2013-03-10 NOTE — Evaluation (Signed)
Physical Therapy Evaluation  Patient Details  Name: Lisa Vega MRN: [id number] Date of Birth: 06-16-1932  Today's Date: 03/10/2013 Time: 1115-1200 PT Time Calculation (min): 45 min Charges: 1 evaluation             Visit#: 1 of 12  Re-eval: 04/08/13 Assessment Diagnosis: Neck Pain Next MD Visit: Dr. Ethelene Hal - unscheduled  Authorization: Medicare    Authorization Time Period:    Authorization Visit#: 1 of 10   Past Medical History:  Past Medical History  Diagnosis Date  . Cancer     lung ca  . COPD (chronic obstructive pulmonary disease)   . Dyspnea   . Rheumatoid arthritis    Past Surgical History:  Past Surgical History  Procedure Laterality Date  . Right lobectomy  10/2004  . Appendectomy     Subjective Symptoms/Limitations Symptoms: significant PMH: rheumatoid arthritis, hx of lung cancer Pertinent History: Pt is a 79 year old female referred to PT for neck pain.  C/o is neck and shoulder pain, especially rotating her neck.  She has been given pain medications but does not feel she wants to take them due to cognitive difficulties when she takes them.  Currently she takes ibuporfen at night time to help decrease her pain.  throughout interview she mentions many stressful triggers in her life.  Patient Stated Goals: to decrease pain.  Pain Assessment Currently in Pain?: Yes Pain Score:   4 Pain Location: Neck Pain Orientation: Right;Left Pain Type: Acute pain;Chronic pain Pain Onset: Other (comment) (years) Pain Frequency: Constant Pain Relieving Factors: ibuprofen  Precautions/Restrictions  Precautions Precaution Comments: HX of Cancer  Balance Screening Balance Screen Has the patient fallen in the past 6 months: No Has the patient had a decrease in activity level because of a fear of falling? : No Is the patient reluctant to leave their home because of a fear of falling? : No  Prior Functioning  Home Living Lives With: Alone Prior Function Level of  Independence: Independent with basic ADLs Vocation: Retired Comments: She has a busy life with her family and friends.  She is helping with her ex-husband who is currently on hospice care and helping take care of paper work.  She has 3 children, 6 grandchildren and 9 great grandchildren.   Assessment RUE Strength Right Shoulder Flexion: 4/5 Right Shoulder Extension: 3/5 Right Shoulder ABduction: 4/5 Right Shoulder Internal Rotation: 3/5 (behind back) LUE Strength Left Shoulder Flexion: 4/5 Left Shoulder Extension: 3/5 Left Shoulder ABduction: 4/5 Left Shoulder Internal Rotation: 3/5 (behind back) Cervical AROM Cervical Flexion: WNL- pain Cervical Extension: WNL - pain Cervical - Right Side Bend: 20.5 cm Cervical - Left Side Bend: 20 Cervical - Right Rotation: 18 cm Cervical - Left Rotation: 20 cm Cervical Strength Overall Cervical Strength Comments: middle trapezius: 3/5, lower trapesius 3/5 Cervical Flexion: 3/5 Cervical Extension: 3/5 Cervical - Right Side Bend: 3/5 Cervical - Left Side Bend: 3/5 Cervical - Right Rotation: 3/5 Palpation Palpation: pain and tenderness to scapular region and cervical through thoracic.  Decreased cervical SP and thoracic SP posterior-anterior mobility   Mobility/Balance  Posture/Postural Control Posture/Postural Control: Postural limitations Postural Limitations: moderately slouched posture   Exercise/Treatments Seated Exercises Cervical Isometrics: Flexion;Extension;Right lateral flexion;Left lateral flexion;5 secs;1 rep Neck Retraction: 10 reps Other Seated Exercise: scapular retraction 10 reps  Physical Therapy Assessment and Plan PT Assessment and Plan Clinical Impression Statement: Pt is a 79 year old female referred to PT for cervical pain with following impairments listed below.  After  evaluation it was found that the patient has a significant amount of stress in her life causing an acute on chronic neck pain.   Pt will benefit  from skilled therapeutic intervention in order to improve on the following deficits: Pain;Decreased strength;Decreased range of motion;Increased muscle spasms;Increased fascial restricitons;Improper body mechanics Rehab Potential: Good PT Frequency: Min 3X/week PT Duration: 8 weeks PT Treatment/Interventions: Therapeutic activities;Therapeutic exercise;Neuromuscular re-education;Patient/family education;Manual techniques PT Plan: Hx of cancer.  Continue with gentle cervical strengthening, UBE for UE and cervical strength. chin tucks, prone on elbow activities, elbow corner presses.  Progress to theraband activities.  Use manual techniques to help decrease fascial pain.     Goals Home Exercise Program Pt will Perform Home Exercise Program: Independently PT Goal: Perform Home Exercise Program - Progress: Goal set today PT Short Term Goals Time to Complete Short Term Goals: 4 weeks PT Short Term Goal 1: Pt will report pain less than 4/10 for 50% of her day for improved QOL.  PT Short Term Goal 2: Pt will present with moderate fascial restrictions and improved thoracic and cervical spinal mobility to improve cervical AROM.  PT Short Term Goal 3: Pt will improve her cervical rotation by 2 cm in each direction in order for greater ease when driving her car. PT Long Term Goals Time to Complete Long Term Goals: 8 weeks PT Long Term Goal 1: Pt will improve her shoulder strength to [facility] in order to report greater ease with overhead activities.  PT Long Term Goal 2: Pt will indepenently verbalize importance and approrpriate posture to decrease chance of secondary impairments.  Long Term Goal 3: Pt will improve her cervical mobility to WNL in order to have greater ease when having a conversation to improve QOL.  Long Term Goal 4: Pt will improve her cervical strength to WNL in order to perform appropriate posture with minimal cueing to reduce headaches.   Problem List Patient Active Problem List   Diagnosis  . CARCINOMA, LUNG, NONSMALL CELL  . ARTHRITIS, RHEUMATOID  . DYSPNEA  . COPD (chronic obstructive pulmonary disease)  . Vaginal atrophy  . Uterine prolapse  . Rectocele  . Cystocele  . Female pelvic pain  . Urinary incontinence  . Neck pain    PT Plan of Care PT Home Exercise Plan: see scanned report PT Patient Instructions: importance of posture, educated pt on factors causing neck pain and how to prevent and become aware of stressors.  Answered questions.  Consulted and Agree with Plan of Care: Patient  GP Functional Assessment Tool Used: clinical observation  Functional Limitation: Changing and maintaining body position Changing and Maintaining Body Position Current Status (N5621): At least 20 percent but less than 40 percent impaired, limited or restricted Changing and Maintaining Body Position Goal Status (H0865): At least 1 percent but less than 20 percent impaired, limited or restricted  LISA [name], PT 03/10/2013, 2:09 PM  Physician Documentation Your signature is required to indicate approval of the treatment plan as stated above.  Please sign and either send electronically or make a copy of this report for your files and return this physician signed original.   Please mark one 1.__approve of plan  2. ___approve of plan with the following conditions.   ______________________________  _____________________ Physician Signature                                                                                                             Date

## 2013-03-10 NOTE — Progress Notes (Signed)
78 y/o new patient to the practice who was being followed by Dr. Nicholas Lose who has retired, for [employer] as a result of her history uterine prolapse (cystocele, rectocele, and urinary incontinence). We have not received her previous records to review. She was having some urinary frequency and RLQ discomfort. She stated she had a normal ultrasound 3-4 months ago. She is also suffering from vaginal atrophy for which she is on vaginal estrogen.  She has a donut pessary in place that was removed and cleansed for her and reinserted. She has deformed has as a result of her sever arthritis and needs assistance with pessary q 3-4 months.  Pelvis exam: BUS: Artophic changers Vagina: mild atrophic changes no erosion or ulceration from pessary.  Second degree cystocele and second degree rectocele and first degree  uterine descensus. Bimanual exam: tenderness RLQ no rebound or guarding  Patient with history of COPD as well as past partial pneumonectomy as a result of non-small cell carcinoma of the lung.   Last bone density 2 years ago. Will need to review report once I receive records.  Patients PCP is Dr. Tiburcio Pea  U/A: Many bacteria, 7-1 RBC, 0-2 WBC  Patients Pessary was cleaned and replaced. She will be put on Macrobid one PO BID for 7 days and prescribe Uribell one PO QID for two days. She will return back to the office next week for ultrasound. She was also prescribed Vaginal estrogen  (Estradiol 0.02%) to apply twice a week.

## 2013-03-10 NOTE — Progress Notes (Signed)
I would choose the Pyridium 200 mg Q8 hours #10 for bladder pain

## 2013-03-14 ENCOUNTER — Inpatient Hospital Stay (HOSPITAL_COMMUNITY): Admission: RE | Admit: 2013-03-14 | Payer: Medicare Other | Source: Ambulatory Visit | Admitting: Physical Therapy

## 2013-03-21 ENCOUNTER — Encounter: Payer: Self-pay | Admitting: Pulmonary Disease

## 2013-03-21 ENCOUNTER — Ambulatory Visit (INDEPENDENT_AMBULATORY_CARE_PROVIDER_SITE_OTHER): Payer: Medicare Other | Admitting: Pulmonary Disease

## 2013-03-21 VITALS — BP 120/66 | HR 69 | Temp 97.7°F | Wt 165.4 lb

## 2013-03-21 DIAGNOSIS — J449 Chronic obstructive pulmonary disease, unspecified: Secondary | ICD-10-CM | POA: Diagnosis not present

## 2013-03-21 NOTE — Assessment & Plan Note (Signed)
The patient appears to be doing well from a COPD standpoint.  I have stressed to her importance of weight loss and a conditioning program.  She has finished the pulmonary rehabilitation program, and I have asked her to take what she learned and work on this on her own.  She feels that she is at a reasonable baseline on her current bronchodilator regimen, and I have asked her to continue this.

## 2013-03-21 NOTE — Patient Instructions (Addendum)
Stay on symbicort twice a day.  For your hoarseness, rinse after each use.  Rinse mouth and spit, rinse throat/gargle and spit, then rinse throat/gargle and swallow Take chlorpheniramine 4mg , 2 tabs each night at bedtime in the place of claritin.   If you continue to have voice changes, let me know Work on weight loss and some type of exercise program. followup with me in 6mos if doing well.

## 2013-03-21 NOTE — Progress Notes (Signed)
  Subjective:    Patient ID: Lisa Vega, female    DOB: 12/02/1932, 79 y.o.   MRN: [id number]  HPI The patient comes in today for followup of her known moderate COPD.  She has done fairly well since the last visit, but did have a flareup at the end of last year.  She has returned to baseline, and denies any significant cough or mucus production.  She is staying on her symbicort, and eventually stopped Spiriva because she did not feel it was helping her long-term.  Her only complaint today is that of dysphonia and a little bit of a sore throat.  It is unclear if this is secondary to her inhaler or postnasal drip.   Review of Systems  Constitutional: Negative for fever and unexpected weight change.  HENT: Positive for sore throat and trouble swallowing. Negative for ear pain, nosebleeds, congestion, rhinorrhea, sneezing, dental problem, postnasal drip and sinus pressure.   Eyes: Negative for redness and itching.  Respiratory: Positive for shortness of breath. Negative for cough, chest tightness and wheezing.   Cardiovascular: Negative for palpitations and leg swelling.  Gastrointestinal: Negative for nausea and vomiting.  Genitourinary: Negative for dysuria.  Musculoskeletal: Negative for joint swelling.  Skin: Negative for rash.  Neurological: Positive for headaches.  Hematological: Does not bruise/bleed easily.  Psychiatric/Behavioral: Positive for dysphoric mood. The patient is nervous/anxious.        Objective:   Physical Exam Overweight female in no acute distress Nose without purulence or discharge noted  neck without lymphadenopathy or thyromegaly Chest totally clear auscultation, no wheezing Cardiac exam with regular rate and rhythm Lower extremities with no significant edema, no cyanosis Alert and oriented, moves all 4 extremities.       Assessment & Plan:

## 2013-03-22 ENCOUNTER — Ambulatory Visit (INDEPENDENT_AMBULATORY_CARE_PROVIDER_SITE_OTHER): Payer: Medicare Other | Admitting: Gynecology

## 2013-03-22 ENCOUNTER — Ambulatory Visit (INDEPENDENT_AMBULATORY_CARE_PROVIDER_SITE_OTHER): Payer: Medicare Other

## 2013-03-22 DIAGNOSIS — Z4689 Encounter for fitting and adjustment of other specified devices: Secondary | ICD-10-CM

## 2013-03-22 DIAGNOSIS — N949 Unspecified condition associated with female genital organs and menstrual cycle: Secondary | ICD-10-CM

## 2013-03-22 DIAGNOSIS — R1031 Right lower quadrant pain: Secondary | ICD-10-CM

## 2013-03-22 DIAGNOSIS — N8111 Cystocele, midline: Secondary | ICD-10-CM | POA: Diagnosis not present

## 2013-03-22 DIAGNOSIS — N83339 Acquired atrophy of ovary and fallopian tube, unspecified side: Secondary | ICD-10-CM | POA: Diagnosis not present

## 2013-03-22 DIAGNOSIS — R102 Pelvic and perineal pain: Secondary | ICD-10-CM

## 2013-03-22 DIAGNOSIS — N816 Rectocele: Secondary | ICD-10-CM

## 2013-03-22 DIAGNOSIS — N814 Uterovaginal prolapse, unspecified: Secondary | ICD-10-CM

## 2013-03-22 DIAGNOSIS — N8110 Cystocele, unspecified: Secondary | ICD-10-CM

## 2013-03-22 DIAGNOSIS — IMO0002 Reserved for concepts with insufficient information to code with codable children: Secondary | ICD-10-CM

## 2013-03-22 NOTE — Progress Notes (Signed)
Patient is a 79 year old who was seen for the first time as a new patient in our office on 03/10/2013. She was being followed by Dr. Esperanza Richters who retired recently. She was seen in the office on that date for pessary maintenance as a result of her history uterine prolapse (cystocele, rectocele, and urinary incontinence). As of this date we have not received her periods records to review the we will request and again. She was having some urinary frequency and RLQ discomfort. She stated she had a normal ultrasound 3-4 months ago. She had also been suffering from vaginal atrophy for which she is on vaginal estrogen.  She has a donut pessary in place that was removed and cleansed for her and reinserted. She has severely deformed hands and fingers making it difficult for her to remove, clean and reinsert her pessary and this is why she would come every 3-4 months.  During that visit her urinalysis had demonstrated evidence of urinary tract infection on preliminary studies before the culture and she was started on Macrobid one by mouth twice a day for 7 days.  She presented today for an ultrasound to assess her right lower quadrant discomfort which she states that since the last time it has resolved. Results of the ultrasound as follows:  Uterus measures 7.5 x 4.1 x 3.5 cm with endometrial stripe of 1.7 mm. Thin echogenic endometrial cavity was evident. Right and left ovary were otherwise normal and atrophic ovaries.  Assessment/plan: 79 year old high-risk patient with multiple comorbidities and history uterine prolapse (cystocele, rectocele, and urinary incontinence) doing well with a ring pessary. She will return back for followup the end of June early July. We'll try to obtain her records from Dr. Nicholas Lose for review before that time.

## 2013-04-10 DIAGNOSIS — IMO0002 Reserved for concepts with insufficient information to code with codable children: Secondary | ICD-10-CM | POA: Diagnosis not present

## 2013-04-10 DIAGNOSIS — M1710 Unilateral primary osteoarthritis, unspecified knee: Secondary | ICD-10-CM | POA: Diagnosis not present

## 2013-04-24 DIAGNOSIS — J040 Acute laryngitis: Secondary | ICD-10-CM | POA: Diagnosis not present

## 2013-04-25 DIAGNOSIS — Z79899 Other long term (current) drug therapy: Secondary | ICD-10-CM | POA: Diagnosis not present

## 2013-04-25 DIAGNOSIS — M069 Rheumatoid arthritis, unspecified: Secondary | ICD-10-CM | POA: Diagnosis not present

## 2013-04-25 DIAGNOSIS — M2550 Pain in unspecified joint: Secondary | ICD-10-CM | POA: Diagnosis not present

## 2013-05-01 DIAGNOSIS — J040 Acute laryngitis: Secondary | ICD-10-CM | POA: Diagnosis not present

## 2013-05-03 ENCOUNTER — Telehealth: Payer: Self-pay | Admitting: *Deleted

## 2013-05-03 DIAGNOSIS — M1710 Unilateral primary osteoarthritis, unspecified knee: Secondary | ICD-10-CM | POA: Diagnosis not present

## 2013-05-03 DIAGNOSIS — IMO0002 Reserved for concepts with insufficient information to code with codable children: Secondary | ICD-10-CM | POA: Diagnosis not present

## 2013-05-03 MED ORDER — FLUCONAZOLE 100 MG PO TABS: 100.0000 mg | ORAL_TABLET | Freq: Once | ORAL | Status: DC

## 2013-05-03 NOTE — Telephone Encounter (Signed)
Pt calling c/o vagina itching and irritation only, no discharge. She asked if Rx could be given since she was just see on 03/22/13. I told her OV best. Please advise

## 2013-05-03 NOTE — Telephone Encounter (Signed)
Left below on pt voicemail, rx sent.

## 2013-05-03 NOTE — Telephone Encounter (Signed)
Please cal in Diflucan 100 mg #1 refill x 2

## 2013-05-04 NOTE — Telephone Encounter (Signed)
Patient called today to let us know that she did pick up the prescription called in yesterday for her.

## 2013-06-07 ENCOUNTER — Telehealth: Payer: Self-pay | Admitting: Pulmonary Disease

## 2013-06-07 MED ORDER — PREDNISONE 10 MG PO TABS: ORAL_TABLET | ORAL | Status: DC

## 2013-06-07 MED ORDER — LEVOFLOXACIN 750 MG PO TABS: 750.0000 mg | ORAL_TABLET | Freq: Every day | ORAL | Status: DC

## 2013-06-07 NOTE — Telephone Encounter (Signed)
Ok to call in levaquin 750mg  one a day for 5 days Prednisone 10mg , take 40mg  each day for 2 days, then 30mg  each day for 2 days, then 20mg  each day for 2 days, then 10mg  each day for 2 days, then stop.

## 2013-06-07 NOTE — Telephone Encounter (Signed)
Pt is aware of KC recommendations. She agrees and those rx's have been sent to the pharmacy.

## 2013-06-07 NOTE — Telephone Encounter (Signed)
Called spoke with patient who c/o chest tightness, increased SOB, prod cough with green mucus x5days.  Denies f/c/s.  Has been taking Dayquil over the counter.  Pt has upcoming ov on 9.23.14.  No openings today with any provider. KC please advise, thank you. [facility] Allergies  Allergen Reactions  . Aspirin   . Codeine     REACTION: hallucinations  . Demerol   . Meperidine Hcl     REACTION: severe GI upset

## 2013-06-12 ENCOUNTER — Ambulatory Visit: Payer: Medicare Other | Admitting: Gynecology

## 2013-06-14 DIAGNOSIS — H18519 Endothelial corneal dystrophy, unspecified eye: Secondary | ICD-10-CM | POA: Diagnosis not present

## 2013-06-14 DIAGNOSIS — Z961 Presence of intraocular lens: Secondary | ICD-10-CM | POA: Diagnosis not present

## 2013-06-16 DIAGNOSIS — IMO0002 Reserved for concepts with insufficient information to code with codable children: Secondary | ICD-10-CM | POA: Diagnosis not present

## 2013-06-16 DIAGNOSIS — M1710 Unilateral primary osteoarthritis, unspecified knee: Secondary | ICD-10-CM | POA: Diagnosis not present

## 2013-06-22 ENCOUNTER — Encounter: Payer: Self-pay | Admitting: Gynecology

## 2013-06-22 ENCOUNTER — Ambulatory Visit (INDEPENDENT_AMBULATORY_CARE_PROVIDER_SITE_OTHER): Payer: Medicare Other | Admitting: Gynecology

## 2013-06-22 DIAGNOSIS — N952 Postmenopausal atrophic vaginitis: Secondary | ICD-10-CM

## 2013-06-22 DIAGNOSIS — N8111 Cystocele, midline: Secondary | ICD-10-CM

## 2013-06-22 DIAGNOSIS — N9089 Other specified noninflammatory disorders of vulva and perineum: Secondary | ICD-10-CM

## 2013-06-22 DIAGNOSIS — N816 Rectocele: Secondary | ICD-10-CM

## 2013-06-22 DIAGNOSIS — N760 Acute vaginitis: Secondary | ICD-10-CM | POA: Diagnosis not present

## 2013-06-22 DIAGNOSIS — N819 Female genital prolapse, unspecified: Secondary | ICD-10-CM

## 2013-06-22 DIAGNOSIS — IMO0002 Reserved for concepts with insufficient information to code with codable children: Secondary | ICD-10-CM

## 2013-06-22 DIAGNOSIS — L293 Anogenital pruritus, unspecified: Secondary | ICD-10-CM | POA: Diagnosis not present

## 2013-06-22 DIAGNOSIS — N762 Acute vulvitis: Secondary | ICD-10-CM

## 2013-06-22 DIAGNOSIS — N898 Other specified noninflammatory disorders of vagina: Secondary | ICD-10-CM

## 2013-06-22 LAB — URINALYSIS W MICROSCOPIC + REFLEX CULTURE
Bilirubin Urine: NEGATIVE
Casts: NONE SEEN
Crystals: NONE SEEN
Glucose, UA: NEGATIVE mg/dL
Ketones, ur: NEGATIVE mg/dL
Nitrite: NEGATIVE
Protein, ur: NEGATIVE mg/dL
Specific Gravity, Urine: 1.015 (ref 1.005–1.030)
Urobilinogen, UA: 0.2 mg/dL (ref 0.0–1.0)
pH: 5.5 (ref 5.0–8.0)

## 2013-06-22 LAB — WET PREP FOR TRICH, YEAST, CLUE
Clue Cells Wet Prep HPF POC: NONE SEEN
Trich, Wet Prep: NONE SEEN
WBC, Wet Prep HPF POC: NONE SEEN
Yeast Wet Prep HPF POC: NONE SEEN

## 2013-06-22 NOTE — Progress Notes (Signed)
The patient is a 79 year old who presented to the office today to have her pessary removed cleaned and replaced. She was seen for the first time in the office on March 26 of this year. Patient has a history of mild uterine prolapse along with cystocele rectocele and had had urinary incontinence. The patient has had history of lung cancer and lobectomy. She also has history of COPD. She was having lower bowel discomfort when she was seen back in March an ultrasound was done which was essentially normal. She states that she feels like she's irritated or itching on the outside of the vagina. She is applying estrogen vaginal cream inside the vagina twice a week.  The ring pessary was removed the vaginal mucosa and cervix were inspected. She was examined in the supine and erect position. She has a second-degree cystocele with mild uterine descensus and a mild rectocele. The ring pessary was placed back into the vagina again.  A wet prep was done because of her colitis and was negative. Her vulvitis is terminated to her atrophy. I have asked her to apply the estrogen cream externally as well as internally twice a week. A urinalysis was obtained which demonstrated 3-6 RBC, 3-WBC and few bacteria culture pending at time of this dictation.  We discussed surgical options such as a transvaginal hysterectomy with bilateral salpingo-oophorectomy and anterior colporrhaphy. Also discussed urodynamic evaluation. Patient states that she would do everything possible not to have surgery and she would like to give the pessary some more time. She will return back in 4 months for followup.

## 2013-06-22 NOTE — Patient Instructions (Signed)
Hysterectomy Information  A hysterectomy is a procedure where your uterus is surgically removed. It will no longer be possible to have menstrual periods or to become pregnant. The tubes and ovaries can be removed (bilateral salpingo-oopherectomy) during this surgery as well.  REASONS FOR A HYSTERECTOMY  Persistent, abnormal bleeding.  Lasting (chronic) pelvic pain or infection.  The lining of the uterus (endometrium) starts growing outside the uterus (endometriosis).  The endometrium starts growing in the muscle of the uterus (adenomyosis).  The uterus falls down into the vagina (pelvic organ prolapse).  Symptomatic uterine fibroids.  Precancerous cells.  Cervical cancer or uterine cancer. TYPES OF HYSTERECTOMIES  Supracervical hysterectomy. This type removes the top part of the uterus, but not the cervix.  Total hysterectomy. This type removes the uterus and cervix.  Radical hysterectomy. This type removes the uterus, cervix, and the fibrous tissue that holds the uterus in place in the pelvis (parametrium). WAYS A HYSTERECTOMY CAN BE PERFORMED  Abdominal hysterectomy. A large surgical cut (incision) is made in the abdomen. The uterus is removed through this incision.  Vaginal hysterectomy. An incision is made in the vagina. The uterus is removed through this incision. There are no abdominal incisions.  Conventional laparoscopic hysterectomy. A thin, lighted tube with a camera (laparoscope) is inserted into 3 or 4 small incisions in the abdomen. The uterus is cut into small pieces. The small pieces are removed through the incisions, or they are removed through the vagina.  Laparoscopic assisted vaginal hysterectomy (LAVH). Three or four small incisions are made in the abdomen. Part of the surgery is performed laparoscopically and part vaginally. The uterus is removed through the vagina.  Robot-assisted laparoscopic hysterectomy. A laparoscope is inserted into 3 or 4 small  incisions in the abdomen. A computer-controlled device is used to give the surgeon a 3D image. This allows for more precise movements of surgical instruments. The uterus is cut into small pieces and removed through the incisions or removed through the vagina. RISKS OF HYSTERECTOMY   Bleeding and risk of blood transfusion. Tell your caregiver if you do not want to receive any blood products.  Blood clots in the legs or lung.  Infection.  Injury to surrounding organs.  Anesthesia problems or side effects.  Conversion to an abdominal hysterectomy. WHAT TO EXPECT AFTER A HYSTERECTOMY  You will be given pain medicine.  You will need to have someone with you for the first 3 to 5 days after you go home.  You will need to follow up with your surgeon in 2 to 4 weeks after surgery to evaluate your progress.  You may have early menopause symptoms like hot flashes, night sweats, and insomnia.  If you had a hysterectomy for a problem that was not a cancer or a condition that could lead to cancer, then you no longer need Pap tests. However, even if you no longer need a Pap test, a regular exam is a good idea to make sure no other problems are starting. Document Released: 06/09/2001 Document Revised: 03/07/2012 Document Reviewed: 07/25/2011 [employer] Patient Information 2014 [employer]  

## 2013-06-23 DIAGNOSIS — IMO0002 Reserved for concepts with insufficient information to code with codable children: Secondary | ICD-10-CM | POA: Diagnosis not present

## 2013-06-23 DIAGNOSIS — M1710 Unilateral primary osteoarthritis, unspecified knee: Secondary | ICD-10-CM | POA: Diagnosis not present

## 2013-06-24 LAB — URINE CULTURE
Colony Count: NO GROWTH
Organism ID, Bacteria: NO GROWTH

## 2013-06-28 ENCOUNTER — Other Ambulatory Visit: Payer: Self-pay | Admitting: *Deleted

## 2013-06-28 MED ORDER — BUDESONIDE-FORMOTEROL FUMARATE 160-4.5 MCG/ACT IN AERO: 2.0000 | INHALATION_SPRAY | Freq: Two times a day (BID) | RESPIRATORY_TRACT | Status: DC

## 2013-06-29 DIAGNOSIS — IMO0002 Reserved for concepts with insufficient information to code with codable children: Secondary | ICD-10-CM | POA: Diagnosis not present

## 2013-06-29 DIAGNOSIS — M1710 Unilateral primary osteoarthritis, unspecified knee: Secondary | ICD-10-CM | POA: Diagnosis not present

## 2013-07-06 ENCOUNTER — Telehealth: Payer: Self-pay | Admitting: Pulmonary Disease

## 2013-07-06 MED ORDER — BUDESONIDE-FORMOTEROL FUMARATE 160-4.5 MCG/ACT IN AERO: 2.0000 | INHALATION_SPRAY | Freq: Two times a day (BID) | RESPIRATORY_TRACT | Status: DC

## 2013-07-06 NOTE — Telephone Encounter (Signed)
Spoke with pt to verify the msg New rx was sent to pharm for 90 days supply with 3 refills

## 2013-07-10 DIAGNOSIS — L738 Other specified follicular disorders: Secondary | ICD-10-CM | POA: Diagnosis not present

## 2013-07-10 DIAGNOSIS — M069 Rheumatoid arthritis, unspecified: Secondary | ICD-10-CM | POA: Diagnosis not present

## 2013-07-10 DIAGNOSIS — G250 Essential tremor: Secondary | ICD-10-CM | POA: Diagnosis not present

## 2013-07-10 DIAGNOSIS — E785 Hyperlipidemia, unspecified: Secondary | ICD-10-CM | POA: Diagnosis not present

## 2013-07-10 DIAGNOSIS — E039 Hypothyroidism, unspecified: Secondary | ICD-10-CM | POA: Diagnosis not present

## 2013-07-10 DIAGNOSIS — L678 Other hair color and hair shaft abnormalities: Secondary | ICD-10-CM | POA: Diagnosis not present

## 2013-07-28 DIAGNOSIS — M5030 Other cervical disc degeneration, unspecified cervical region: Secondary | ICD-10-CM | POA: Diagnosis not present

## 2013-08-07 ENCOUNTER — Ambulatory Visit (INDEPENDENT_AMBULATORY_CARE_PROVIDER_SITE_OTHER): Payer: Medicare Other | Admitting: Gynecology

## 2013-08-07 ENCOUNTER — Encounter: Payer: Self-pay | Admitting: Gynecology

## 2013-08-07 VITALS — Temp 98.9°F

## 2013-08-07 DIAGNOSIS — Z4689 Encounter for fitting and adjustment of other specified devices: Secondary | ICD-10-CM

## 2013-08-07 DIAGNOSIS — K5900 Constipation, unspecified: Secondary | ICD-10-CM | POA: Diagnosis not present

## 2013-08-07 DIAGNOSIS — K5732 Diverticulitis of large intestine without perforation or abscess without bleeding: Secondary | ICD-10-CM

## 2013-08-07 DIAGNOSIS — R300 Dysuria: Secondary | ICD-10-CM

## 2013-08-07 DIAGNOSIS — N814 Uterovaginal prolapse, unspecified: Secondary | ICD-10-CM

## 2013-08-07 LAB — URINALYSIS W MICROSCOPIC + REFLEX CULTURE
Casts: NONE SEEN
Crystals: NONE SEEN
Glucose, UA: NEGATIVE mg/dL
Nitrite: NEGATIVE
Protein, ur: 30 mg/dL — AB
Specific Gravity, Urine: 1.025 (ref 1.005–1.030)
Urobilinogen, UA: 0.2 mg/dL (ref 0.0–1.0)
pH: 5.5 (ref 5.0–8.0)

## 2013-08-07 MED ORDER — CIPROFLOXACIN HCL 500 MG PO TABS: 500.0000 mg | ORAL_TABLET | Freq: Two times a day (BID) | ORAL | Status: DC

## 2013-08-07 MED ORDER — METOCLOPRAMIDE HCL 10 MG PO TABS: 10.0000 mg | ORAL_TABLET | Freq: Three times a day (TID) | ORAL | Status: DC

## 2013-08-07 MED ORDER — METRONIDAZOLE 500 MG PO TABS: ORAL_TABLET | ORAL | Status: DC

## 2013-08-07 NOTE — Patient Instructions (Signed)
Diverticulitis °A diverticulum is a small pouch or sac on the colon. Diverticulosis is the presence of these diverticula on the colon. Diverticulitis is the irritation (inflammation) or infection of diverticula. °CAUSES  °The colon and its diverticula contain bacteria. If food particles block the tiny opening to a diverticulum, the bacteria inside can grow and cause an increase in pressure. This leads to infection and inflammation and is called diverticulitis. °SYMPTOMS  °· Abdominal pain and tenderness. Usually, the pain is located on the left side of your abdomen. However, it could be located elsewhere. °· Fever. °· Bloating. °· Feeling sick to your stomach (nausea). °· Throwing up (vomiting). °· Abnormal stools. °DIAGNOSIS  °Your caregiver will take a history and perform a physical exam. Since many things can cause abdominal pain, other tests may be necessary. Tests may include: °· Blood tests. °· Urine tests. °· X-ray of the abdomen. °· CT scan of the abdomen. °Sometimes, surgery is needed to determine if diverticulitis or other conditions are causing your symptoms. °TREATMENT  °Most of the time, you can be treated without surgery. Treatment includes: °· Resting the bowels by only having liquids for a few days. As you improve, you will need to eat a low-fiber diet. °· Intravenous (IV) fluids if you are losing body fluids (dehydrated). °· Antibiotic medicines that treat infections may be given. °· Pain and nausea medicine, if needed. °· Surgery if the inflamed diverticulum has burst. °HOME CARE INSTRUCTIONS  °· Try a clear liquid diet (broth, tea, or water for as long as directed by your caregiver). You may then gradually begin a low-fiber diet as tolerated.  °A low-fiber diet is a diet with less than 10 grams of fiber. Choose the foods below to reduce fiber in the diet: °· White breads, cereals, rice, and pasta. °· Cooked fruits and vegetables or soft fresh fruits and vegetables without the skin. °· Ground or  well-cooked tender beef, ham, veal, lamb, pork, or poultry. °· Eggs and seafood. °· After your diverticulitis symptoms have improved, your caregiver may put you on a high-fiber diet. A high-fiber diet includes 14 grams of fiber for every 1000 calories consumed. For a standard 2000 calorie diet, you would need 28 grams of fiber. Follow these diet guidelines to help you increase the fiber in your diet. It is important to slowly increase the amount fiber in your diet to avoid gas, constipation, and bloating. °· Choose whole-grain breads, cereals, pasta, and brown rice. °· Choose fresh fruits and vegetables with the skin on. Do not overcook vegetables because the more vegetables are cooked, the more fiber is lost. °· Choose more nuts, seeds, legumes, dried peas, beans, and lentils. °· Look for food products that have greater than 3 grams of fiber per serving on the Nutrition Facts label. °· Take all medicine as directed by your caregiver. °· If your caregiver has given you a follow-up appointment, it is very important that you go. Not going could result in lasting (chronic) or permanent injury, pain, and disability. If there is any problem keeping the appointment, call to reschedule. °SEEK MEDICAL CARE IF:  °· Your pain does not improve. °· You have a hard time advancing your diet beyond clear liquids. °· Your bowel movements do not return to normal. °SEEK IMMEDIATE MEDICAL CARE IF:  °· Your pain becomes worse. °· You have an oral temperature above 102° F (38.9° C), not controlled by medicine. °· You have repeated vomiting. °· You have bloody or black, tarry stools. °·   Symptoms that brought you to your caregiver become worse or are not getting better. °MAKE SURE YOU:  °· Understand these instructions. °· Will watch your condition. °· Will get help right away if you are not doing well or get worse. °Document Released: 09/23/2005 Document Revised: 03/07/2012 Document Reviewed: 01/19/2011 °[employer]® Patient Information  ©2014 [employer]. ° °

## 2013-08-07 NOTE — Progress Notes (Signed)
Patient presented to the office stating that over the past weekend she felt like she had low-grade fever along with some mild low back discomfort. She denied any vaginal discharge. Patient does have history of pelvic organ prolapse for which she is currently wearing a ring pessary. Patient stated that time she felt that she cannot empty completely. And at time has suffered from constipation. Her temperature at home got as high as 100.1 and she took ibuprofen and Tylenol and begins to feel somewhat better and her fever came down. She did mention that she has been treated in the past for diverticulitis. She did have some dysuria and occasional frequency.  Exam: Abdomen soft though slightly tender in the left lower abdomen is also mild left CVA tenderness but no rebound or guarding. Pelvic exam: And urethra Skene was within normal limits and vaginal ring removed and both cystocele and rectocele were evident. Cervix with no lesions seen Rectal exam: negative without mass, lesions or tenderness. Exam: Nontender no palpable masses  Urinalysis WBC 3-6, RBC 11-20, bacteria few Temperature 98.9  Assessment/plan: Suspect diverticulitis uncomplicated we'll treat as outpatient with Cipro 500 mg one by mouth twice a day for [redacted] weeks along with Flagyl 500 mg 1 by mouth 3 times a day. Patient was instructed to maintain on a full liquid diet today and tomorrow and advance diet gradually. If she develops any high fever or worsening of abdominal pain she will report to the emergency room for consideration of inpatient intravenous antibiotic treatment and further evaluation of any worsening diverticulitis. She can take Tylenol or ibuprofen when necessary. Literature and information was provided. We'll await the results of the urine culture as well. Her parous 3 was removed cleansed and reinserted. Patient was also given a prescription Reglan 10 mg to take one by mouth every 4-6 hours any nausea or vomiting.

## 2013-08-08 LAB — URINE CULTURE
Colony Count: NO GROWTH
Colony Count: NO GROWTH
Organism ID, Bacteria: NO GROWTH
Organism ID, Bacteria: NO GROWTH

## 2013-08-16 DIAGNOSIS — L738 Other specified follicular disorders: Secondary | ICD-10-CM | POA: Diagnosis not present

## 2013-08-16 DIAGNOSIS — L678 Other hair color and hair shaft abnormalities: Secondary | ICD-10-CM | POA: Diagnosis not present

## 2013-08-16 DIAGNOSIS — L0292 Furuncle, unspecified: Secondary | ICD-10-CM | POA: Diagnosis not present

## 2013-09-06 DIAGNOSIS — IMO0002 Reserved for concepts with insufficient information to code with codable children: Secondary | ICD-10-CM | POA: Diagnosis not present

## 2013-09-06 DIAGNOSIS — J449 Chronic obstructive pulmonary disease, unspecified: Secondary | ICD-10-CM | POA: Diagnosis not present

## 2013-09-06 DIAGNOSIS — M1710 Unilateral primary osteoarthritis, unspecified knee: Secondary | ICD-10-CM | POA: Diagnosis not present

## 2013-09-06 DIAGNOSIS — M2550 Pain in unspecified joint: Secondary | ICD-10-CM | POA: Diagnosis not present

## 2013-09-06 DIAGNOSIS — M542 Cervicalgia: Secondary | ICD-10-CM | POA: Diagnosis not present

## 2013-09-06 DIAGNOSIS — M069 Rheumatoid arthritis, unspecified: Secondary | ICD-10-CM | POA: Diagnosis not present

## 2013-09-06 DIAGNOSIS — M25569 Pain in unspecified knee: Secondary | ICD-10-CM | POA: Diagnosis not present

## 2013-09-20 ENCOUNTER — Encounter: Payer: Self-pay | Admitting: Pulmonary Disease

## 2013-09-20 ENCOUNTER — Ambulatory Visit (INDEPENDENT_AMBULATORY_CARE_PROVIDER_SITE_OTHER): Payer: Medicare Other | Admitting: Pulmonary Disease

## 2013-09-20 VITALS — BP 130/76 | HR 58 | Temp 97.3°F | Ht 65.0 in | Wt 162.4 lb

## 2013-09-20 DIAGNOSIS — Z23 Encounter for immunization: Secondary | ICD-10-CM | POA: Diagnosis not present

## 2013-09-20 DIAGNOSIS — J449 Chronic obstructive pulmonary disease, unspecified: Secondary | ICD-10-CM

## 2013-09-20 NOTE — Assessment & Plan Note (Signed)
The patient overall has been doing fairly well, but did have more breathing issues during the summer.  Replete this will improve with the cooler temperatures.  She would also like to try Spiriva again, since she believes that she did not give it a fair chance the first time around.  I've encouraged her to continue working on an exercise program modest weight loss.  Also give her the flu shot today.

## 2013-09-20 NOTE — Progress Notes (Signed)
  Subjective:    Patient ID: Lisa Vega, female    DOB: Nov 20, 1932, 79 y.o.   MRN: [id number]  HPI Patient comes in today for followup of her known COPD.  She has done fairly well since the last visit, but has noticed increased symptoms with a high heat and humidity.  She did have an episode of acute bronchitis in June that required an antibiotic.  She has initially thought Spiriva did not help her, she is interested in trying this again.   Review of Systems  Constitutional: Negative for fever and unexpected weight change.  HENT: Negative for ear pain, nosebleeds, congestion, sore throat, rhinorrhea, sneezing, trouble swallowing, dental problem, postnasal drip and sinus pressure.   Eyes: Negative for redness and itching.  Respiratory: Positive for shortness of breath. Negative for cough, chest tightness and wheezing.   Cardiovascular: Negative for palpitations and leg swelling.  Gastrointestinal: Negative for nausea and vomiting.  Genitourinary: Negative for dysuria.  Musculoskeletal: Negative for joint swelling.  Skin: Negative for rash.  Neurological: Negative for headaches.  Hematological: Does not bruise/bleed easily.  Psychiatric/Behavioral: Negative for dysphoric mood. The patient is not nervous/anxious.        Objective:   Physical Exam Well-developed female in no acute distress Nose without purulence or discharge noted Neck without lymphadenopathy or thyromegaly Chest with mildly decreased breath sounds, no wheezing Cardiac exam with regular rate and rhythm Lower extremities with minimal edema, no cyanosis Alert and oriented, moves all 4 extremities.       Assessment & Plan:

## 2013-09-20 NOTE — Patient Instructions (Addendum)
Stay on symbicort as directed. Will try spiriva again, one inhalation each am. Will give you the flu shot today. Stay as active as possible. F/u with me in 6mos.

## 2013-09-26 DIAGNOSIS — M1710 Unilateral primary osteoarthritis, unspecified knee: Secondary | ICD-10-CM | POA: Diagnosis not present

## 2013-09-26 DIAGNOSIS — IMO0002 Reserved for concepts with insufficient information to code with codable children: Secondary | ICD-10-CM | POA: Diagnosis not present

## 2013-10-02 DIAGNOSIS — B029 Zoster without complications: Secondary | ICD-10-CM | POA: Diagnosis not present

## 2013-10-03 ENCOUNTER — Ambulatory Visit (INDEPENDENT_AMBULATORY_CARE_PROVIDER_SITE_OTHER): Payer: Medicare Other | Admitting: Gynecology

## 2013-10-03 ENCOUNTER — Encounter: Payer: Self-pay | Admitting: Gynecology

## 2013-10-03 VITALS — BP 130/80

## 2013-10-03 DIAGNOSIS — R8290 Unspecified abnormal findings in urine: Secondary | ICD-10-CM

## 2013-10-03 DIAGNOSIS — R82998 Other abnormal findings in urine: Secondary | ICD-10-CM

## 2013-10-03 DIAGNOSIS — N819 Female genital prolapse, unspecified: Secondary | ICD-10-CM

## 2013-10-03 DIAGNOSIS — Z4689 Encounter for fitting and adjustment of other specified devices: Secondary | ICD-10-CM

## 2013-10-03 LAB — URINALYSIS W MICROSCOPIC + REFLEX CULTURE
Bacteria, UA: NONE SEEN
Bilirubin Urine: NEGATIVE
Casts: NONE SEEN
Crystals: NONE SEEN
Glucose, UA: NEGATIVE mg/dL
Ketones, ur: NEGATIVE mg/dL
Leukocytes, UA: NEGATIVE
Nitrite: NEGATIVE
Protein, ur: NEGATIVE mg/dL
Specific Gravity, Urine: <1.005 — ABNORMAL LOW (ref 1.005–1.030)
Urobilinogen, UA: 0.2 mg/dL (ref 0.0–1.0)
WBC, UA: NONE SEEN WBC/hpf (ref <3)
pH: 5.5 (ref 5.0–8.0)

## 2013-10-03 NOTE — Patient Instructions (Addendum)
Shingles Shingles (herpes zoster) is an infection that is caused by the same virus that causes chickenpox (varicella). The infection causes a painful skin rash and fluid-filled blisters, which eventually break open, crust over, and heal. It may occur in any area of the body, but it usually affects only one side of the body or face. The pain of shingles usually lasts about 1 month. However, some people with shingles may develop long-term (chronic) pain in the affected area of the body. Shingles often occurs many years after the person had chickenpox. It is more common:  In people older than 50 years.  In people with weakened immune systems, such as those with HIV, AIDS, or cancer.  In people taking medicines that weaken the immune system, such as transplant medicines.  In people under great stress. CAUSES  Shingles is caused by the varicella zoster virus (VZV), which also causes chickenpox. After a person is infected with the virus, it can remain in the person's body for years in an inactive state (dormant). To cause shingles, the virus reactivates and breaks out as an infection in a nerve root. The virus can be spread from person to person (contagious) through contact with open blisters of the shingles rash. It will only spread to people who have not had chickenpox. When these people are exposed to the virus, they may develop chickenpox. They will not develop shingles. Once the blisters scab over, the person is no longer contagious and cannot spread the virus to others. SYMPTOMS  Shingles shows up in stages. The initial symptoms may be pain, itching, and tingling in an area of the skin. This pain is usually described as burning, stabbing, or throbbing.In a few days or weeks, a painful red rash will appear in the area where the pain, itching, and tingling were felt. The rash is usually on one side of the body in a band or belt-like pattern. Then, the rash usually turns into fluid-filled blisters. They  will scab over and dry up in approximately 2 3 weeks. Flu-like symptoms may also occur with the initial symptoms, the rash, or the blisters. These may include:  Fever.  Chills.  Headache.  Upset stomach. DIAGNOSIS  Your caregiver will perform a skin exam to diagnose shingles. Skin scrapings or fluid samples may also be taken from the blisters. This sample will be examined under a microscope or sent to a lab for further testing. TREATMENT  There is no specific cure for shingles. Your caregiver will likely prescribe medicines to help you manage the pain, recover faster, and avoid long-term problems. This may include antiviral drugs, anti-inflammatory drugs, and pain medicines. HOME CARE INSTRUCTIONS   Take a cool bath or apply cool compresses to the area of the rash or blisters as directed. This may help with the pain and itching.   Only take over-the-counter or prescription medicines as directed by your caregiver.   Rest as directed by your caregiver.  Keep your rash and blisters clean with mild soap and cool water or as directed by your caregiver.  Do not pick your blisters or scratch your rash. Apply an anti-itch cream or numbing creams to the affected area as directed by your caregiver.  Keep your shingles rash covered with a loose bandage (dressing).  Avoid skin contact with:  Babies.   Pregnant women.   Children with eczema.   Elderly people with transplants.   People with chronic illnesses, such as leukemia or AIDS.   Wear loose-fitting clothing to help ease   the pain of material rubbing against the rash.  Keep all follow-up appointments with your caregiver.If the area involved is on your face, you may receive a referral for follow-up to a specialist, such as an eye doctor (ophthalmologist) or an ear, nose, and throat (ENT) doctor. Keeping all follow-up appointments will help you avoid eye complications, chronic pain, or disability.  SEEK IMMEDIATE MEDICAL  CARE IF:   You have facial pain, pain around the eye area, or loss of feeling on one side of your face.  You have ear pain or ringing in your ear.  You have loss of taste.  Your pain is not relieved with prescribed medicines.   Your redness or swelling spreads.   You have more pain and swelling.  Your condition is worsening or has changed.   You have a feveror persistent symptoms for more than 2 3 days.  You have a fever and your symptoms suddenly get worse. MAKE SURE YOU:  Understand these instructions.  Will watch your condition.  Will get help right away if you are not doing well or get worse. Document Released: 12/14/2005 Document Revised: 09/07/2012 Document Reviewed: 07/28/2012 [employer] Patient Information 2014 [employer].  

## 2013-10-03 NOTE — Progress Notes (Signed)
The patient is a 79 year old who presented to the office today to have her pessary removed cleaned and replaced. She was seen for the first time in the office on March 26 of this year. Patient has a history of mild uterine prolapse along with cystocele rectocele and had had urinary incontinence. The patient has had history of lung cancer and lobectomy. She also has history of COPD. She was having lower bowel discomfort when she was seen back in March an ultrasound was done which was essentially normal. The patient is applying vaginal estrogen twice a week and has been doing well. She thought that her urine had a strange odor to it and went have been tested and her urinalysis was negative with exception of 0-2 RBC and was sent for culture.  Exam: Bartholin urethra Skene glands within normal limits Ring pessary removed cleansed and replaced back into the vagina. No ulcerations were noted in the vaginal mucosa. Second degree cystocele second degree rectocele and first degree uterine descensus was noted.  Assessment/plan:79 year old high-risk patient with multiple comorbidities and history uterine prolapse (cystocele, rectocele, and urinary incontinence) doing well with a ring pessary. Patient has severe debilitating arthritis making it difficult for her to clean and reinsert or remove her pessary. The patient will continue to return every 3 months.

## 2013-10-04 LAB — URINE CULTURE
Colony Count: NO GROWTH
Organism ID, Bacteria: NO GROWTH

## 2013-10-18 DIAGNOSIS — M2010 Hallux valgus (acquired), unspecified foot: Secondary | ICD-10-CM | POA: Diagnosis not present

## 2013-10-18 DIAGNOSIS — L851 Acquired keratosis [keratoderma] palmaris et plantaris: Secondary | ICD-10-CM | POA: Diagnosis not present

## 2013-10-23 ENCOUNTER — Ambulatory Visit: Payer: Medicare Other | Admitting: Gynecology

## 2013-10-26 ENCOUNTER — Encounter: Payer: Self-pay | Admitting: Gynecology

## 2013-10-26 ENCOUNTER — Ambulatory Visit (INDEPENDENT_AMBULATORY_CARE_PROVIDER_SITE_OTHER): Payer: Medicare Other | Admitting: Gynecology

## 2013-10-26 VITALS — BP 130/70

## 2013-10-26 DIAGNOSIS — N816 Rectocele: Secondary | ICD-10-CM | POA: Diagnosis not present

## 2013-10-26 DIAGNOSIS — R39198 Other difficulties with micturition: Secondary | ICD-10-CM

## 2013-10-26 DIAGNOSIS — N8111 Cystocele, midline: Secondary | ICD-10-CM

## 2013-10-26 DIAGNOSIS — R32 Unspecified urinary incontinence: Secondary | ICD-10-CM

## 2013-10-26 DIAGNOSIS — R3989 Other symptoms and signs involving the genitourinary system: Secondary | ICD-10-CM

## 2013-10-26 DIAGNOSIS — N390 Urinary tract infection, site not specified: Secondary | ICD-10-CM

## 2013-10-26 LAB — URINALYSIS W MICROSCOPIC + REFLEX CULTURE
Bilirubin Urine: NEGATIVE
Casts: NONE SEEN
Crystals: NONE SEEN
Glucose, UA: NEGATIVE mg/dL
Ketones, ur: NEGATIVE mg/dL
Nitrite: NEGATIVE
Protein, ur: NEGATIVE mg/dL
Specific Gravity, Urine: 1.01 (ref 1.005–1.030)
Urobilinogen, UA: 0.2 mg/dL (ref 0.0–1.0)
pH: 5.5 (ref 5.0–8.0)

## 2013-10-26 MED ORDER — NITROFURANTOIN MONOHYD MACRO 100 MG PO CAPS: 100.0000 mg | ORAL_CAPSULE | Freq: Two times a day (BID) | ORAL | Status: DC

## 2013-10-26 NOTE — Progress Notes (Signed)
Patient presented to the office complaining of some vaginal irritation and difficulty voiding at times. Also some pelvic pressure as well. Patient is a 80 year old who seen in the office for the first time March 26 of this year.Patient has a history of mild uterine prolapse along with cystocele rectocele and had had urinary incontinence. The patient has had history of lung cancer and lobectomy. She also has history of COPD. She was having lower bowel discomfort when she was seen back in March an ultrasound was done which was essentially normal. The patient is applying vaginal estrogen twice a week and has been doing well. Due to the patient's multiple comorbidities in history uterine prolapse (cystocele, rectocele urinary incontinence) a ring pessary had been provided by another provider that retired and has been working well until recently.  Exam: Pelvic: Bartholin urethra and Skene glands with atrophic changes Vagina the ring pessary was removed the vaginal mucosa was inspected the cystocele rectocele once again noted. There was no ulceration. Good estrogenization since she has been applying estrogen cream twice a week. Q-tip angle demonstrated  A 30 degree change. On Valsalva laying flat she did not leak urine.  Urinalysis demonstrated 7-10 WBC, 3-6 RBC and many bacteria  Patient was fitted for a small ring size pessary but with support a size 3 was used which will be ordered. Patient walked around and felt much better without any discomfort. She is going to go home without her old pessary to rest for a few days before inserting the new pessary. She will continue with the estrogen vaginal twice a week. For her urinary tract infection she will be prescribed Macrobid 1 by mouth twice a day for 7 days.

## 2013-10-26 NOTE — Patient Instructions (Signed)
Urinary Tract Infection  Urinary tract infections (UTIs) can develop anywhere along your urinary tract. Your urinary tract is your body's drainage system for removing wastes and extra water. Your urinary tract includes two kidneys, two ureters, a bladder, and a urethra. Your kidneys are a pair of bean-shaped organs. Each kidney is about the size of your fist. They are located below your ribs, one on each side of your spine.  CAUSES  Infections are caused by microbes, which are microscopic organisms, including fungi, viruses, and bacteria. These organisms are so small that they can only be seen through a microscope. Bacteria are the microbes that most commonly cause UTIs.  SYMPTOMS   Symptoms of UTIs may vary by age and gender of the patient and by the location of the infection. Symptoms in young women typically include a frequent and intense urge to urinate and a painful, burning feeling in the bladder or urethra during urination. Older women and men are more likely to be tired, shaky, and weak and have muscle aches and abdominal pain. A fever may mean the infection is in your kidneys. Other symptoms of a kidney infection include pain in your back or sides below the ribs, nausea, and vomiting.  DIAGNOSIS  To diagnose a UTI, your caregiver will ask you about your symptoms. Your caregiver also will ask to provide a urine sample. The urine sample will be tested for bacteria and white blood cells. White blood cells are made by your body to help fight infection.  TREATMENT   Typically, UTIs can be treated with medication. Because most UTIs are caused by a bacterial infection, they usually can be treated with the use of antibiotics. The choice of antibiotic and length of treatment depend on your symptoms and the type of bacteria causing your infection.  HOME CARE INSTRUCTIONS   If you were prescribed antibiotics, take them exactly as your caregiver instructs you. Finish the medication even if you feel better after you  have only taken some of the medication.   Drink enough water and fluids to keep your urine clear or pale yellow.   Avoid caffeine, tea, and carbonated beverages. They tend to irritate your bladder.   Empty your bladder often. Avoid holding urine for long periods of time.   Empty your bladder before and after sexual intercourse.   After a bowel movement, women should cleanse from front to back. Use each tissue only once.  SEEK MEDICAL CARE IF:    You have back pain.   You develop a fever.   Your symptoms do not begin to resolve within 3 days.  SEEK IMMEDIATE MEDICAL CARE IF:    You have severe back pain or lower abdominal pain.   You develop chills.   You have nausea or vomiting.   You have continued burning or discomfort with urination.  MAKE SURE YOU:    Understand these instructions.   Will watch your condition.   Will get help right away if you are not doing well or get worse.  Document Released: 09/23/2005 Document Revised: 06/14/2012 Document Reviewed: 01/22/2012  [employer] Patient Information 2014 [employer].

## 2013-10-27 LAB — URINE CULTURE
Colony Count: NO GROWTH
Organism ID, Bacteria: NO GROWTH

## 2013-11-03 ENCOUNTER — Encounter: Payer: Self-pay | Admitting: Women's Health

## 2013-11-03 ENCOUNTER — Other Ambulatory Visit: Payer: Self-pay | Admitting: Women's Health

## 2013-11-03 ENCOUNTER — Ambulatory Visit (INDEPENDENT_AMBULATORY_CARE_PROVIDER_SITE_OTHER): Payer: Medicare Other | Admitting: Women's Health

## 2013-11-03 DIAGNOSIS — N390 Urinary tract infection, site not specified: Secondary | ICD-10-CM

## 2013-11-03 DIAGNOSIS — N816 Rectocele: Secondary | ICD-10-CM

## 2013-11-03 DIAGNOSIS — N8111 Cystocele, midline: Secondary | ICD-10-CM

## 2013-11-03 DIAGNOSIS — Z4689 Encounter for fitting and adjustment of other specified devices: Secondary | ICD-10-CM

## 2013-11-03 LAB — URINALYSIS W MICROSCOPIC + REFLEX CULTURE
Bilirubin Urine: NEGATIVE
Casts: NONE SEEN
Crystals: NONE SEEN
Glucose, UA: NEGATIVE mg/dL
Leukocytes, UA: NEGATIVE
Nitrite: NEGATIVE
Protein, ur: NEGATIVE mg/dL
Specific Gravity, Urine: 1.015 (ref 1.005–1.030)
Urobilinogen, UA: 0.2 mg/dL (ref 0.0–1.0)
pH: 6 (ref 5.0–8.0)

## 2013-11-03 NOTE — Progress Notes (Signed)
Patient ID: Lisa Vega, female   DOB: 1932/06/10, 79 y.o.   MRN: [id number] Presents to have the pessary inserted. Has had a pessary in the past that became ineffective. Was for a pessary by Dr. Lily Peer, pessary ordered .  UTI treated one week ago with  Macrobid, UTI symptoms much improved. States at times has difficulty emptying bladder, cystocele. Not sexually active, widowed 03/2013  Exam: Appears well, external genitalia +1 cystocele. Speculum exam vaginal walls pink healthy with no noted erosion. Ring with support pessary- #4, 2  3/4 inch placed with ease. UA: Trace blood, 0- 2 WBCs, 3-6 rbc's, few bacteria.  Pessary for cystocele  Plan: Urine culture pending. return to office in 3 months to have  pessary removed, washed, replaced. Instructed to call or return if urinary symptoms/problems.

## 2013-11-04 LAB — URINE CULTURE
Colony Count: NO GROWTH
Organism ID, Bacteria: NO GROWTH

## 2013-11-08 DIAGNOSIS — K5901 Slow transit constipation: Secondary | ICD-10-CM | POA: Diagnosis not present

## 2013-11-08 DIAGNOSIS — K219 Gastro-esophageal reflux disease without esophagitis: Secondary | ICD-10-CM | POA: Diagnosis not present

## 2013-11-08 DIAGNOSIS — R1011 Right upper quadrant pain: Secondary | ICD-10-CM | POA: Diagnosis not present

## 2013-11-08 DIAGNOSIS — R141 Gas pain: Secondary | ICD-10-CM | POA: Diagnosis not present

## 2014-01-03 ENCOUNTER — Ambulatory Visit: Payer: Medicare Other | Admitting: Gynecology

## 2014-01-03 DIAGNOSIS — Z961 Presence of intraocular lens: Secondary | ICD-10-CM | POA: Diagnosis not present

## 2014-01-03 DIAGNOSIS — H18519 Endothelial corneal dystrophy, unspecified eye: Secondary | ICD-10-CM | POA: Diagnosis not present

## 2014-01-05 ENCOUNTER — Ambulatory Visit: Payer: Medicare Other | Admitting: Gynecology

## 2014-01-09 ENCOUNTER — Ambulatory Visit (INDEPENDENT_AMBULATORY_CARE_PROVIDER_SITE_OTHER): Payer: Medicare Other

## 2014-01-09 ENCOUNTER — Ambulatory Visit (INDEPENDENT_AMBULATORY_CARE_PROVIDER_SITE_OTHER): Payer: Medicare Other | Admitting: Gynecology

## 2014-01-09 ENCOUNTER — Other Ambulatory Visit: Payer: Self-pay | Admitting: Gynecology

## 2014-01-09 ENCOUNTER — Encounter: Payer: Self-pay | Admitting: Gynecology

## 2014-01-09 VITALS — BP 120/74

## 2014-01-09 DIAGNOSIS — B3731 Acute candidiasis of vulva and vagina: Secondary | ICD-10-CM

## 2014-01-09 DIAGNOSIS — N819 Female genital prolapse, unspecified: Secondary | ICD-10-CM

## 2014-01-09 DIAGNOSIS — R141 Gas pain: Secondary | ICD-10-CM

## 2014-01-09 DIAGNOSIS — R143 Flatulence: Secondary | ICD-10-CM

## 2014-01-09 DIAGNOSIS — Z4689 Encounter for fitting and adjustment of other specified devices: Secondary | ICD-10-CM

## 2014-01-09 DIAGNOSIS — N83339 Acquired atrophy of ovary and fallopian tube, unspecified side: Secondary | ICD-10-CM | POA: Diagnosis not present

## 2014-01-09 DIAGNOSIS — L293 Anogenital pruritus, unspecified: Secondary | ICD-10-CM

## 2014-01-09 DIAGNOSIS — M2550 Pain in unspecified joint: Secondary | ICD-10-CM | POA: Diagnosis not present

## 2014-01-09 DIAGNOSIS — R140 Abdominal distension (gaseous): Secondary | ICD-10-CM

## 2014-01-09 DIAGNOSIS — R142 Eructation: Secondary | ICD-10-CM

## 2014-01-09 DIAGNOSIS — R300 Dysuria: Secondary | ICD-10-CM | POA: Diagnosis not present

## 2014-01-09 DIAGNOSIS — N8300 Follicular cyst of ovary, unspecified side: Secondary | ICD-10-CM

## 2014-01-09 DIAGNOSIS — N390 Urinary tract infection, site not specified: Secondary | ICD-10-CM | POA: Diagnosis not present

## 2014-01-09 DIAGNOSIS — B373 Candidiasis of vulva and vagina: Secondary | ICD-10-CM | POA: Diagnosis not present

## 2014-01-09 DIAGNOSIS — Z79899 Other long term (current) drug therapy: Secondary | ICD-10-CM | POA: Diagnosis not present

## 2014-01-09 DIAGNOSIS — L292 Pruritus vulvae: Secondary | ICD-10-CM

## 2014-01-09 DIAGNOSIS — M069 Rheumatoid arthritis, unspecified: Secondary | ICD-10-CM | POA: Diagnosis not present

## 2014-01-09 DIAGNOSIS — N8111 Cystocele, midline: Secondary | ICD-10-CM

## 2014-01-09 DIAGNOSIS — IMO0002 Reserved for concepts with insufficient information to code with codable children: Secondary | ICD-10-CM

## 2014-01-09 LAB — URINALYSIS W MICROSCOPIC + REFLEX CULTURE
Bilirubin Urine: NEGATIVE
Casts: NONE SEEN
Crystals: NONE SEEN
Glucose, UA: NEGATIVE mg/dL
Nitrite: NEGATIVE
Protein, ur: NEGATIVE mg/dL
Specific Gravity, Urine: 1.02 (ref 1.005–1.030)
Urobilinogen, UA: 0.2 mg/dL (ref 0.0–1.0)
pH: 5.5 (ref 5.0–8.0)

## 2014-01-09 LAB — WET PREP FOR TRICH, YEAST, CLUE: Trich, Wet Prep: NONE SEEN

## 2014-01-09 MED ORDER — FLUCONAZOLE 150 MG PO TABS: 150.0000 mg | ORAL_TABLET | Freq: Once | ORAL | Status: DC

## 2014-01-09 MED ORDER — CIPROFLOXACIN HCL 250 MG PO TABS: 250.0000 mg | ORAL_TABLET | Freq: Two times a day (BID) | ORAL | Status: DC

## 2014-01-09 NOTE — Patient Instructions (Signed)
Urinary Tract Infection  Urinary tract infections (UTIs) can develop anywhere along your urinary tract. Your urinary tract is your body's drainage system for removing wastes and extra water. Your urinary tract includes two kidneys, two ureters, a bladder, and a urethra. Your kidneys are a pair of bean-shaped organs. Each kidney is about the size of your fist. They are located below your ribs, one on each side of your spine.  CAUSES  Infections are caused by microbes, which are microscopic organisms, including fungi, viruses, and bacteria. These organisms are so small that they can only be seen through a microscope. Bacteria are the microbes that most commonly cause UTIs.  SYMPTOMS   Symptoms of UTIs may vary by age and gender of the patient and by the location of the infection. Symptoms in young women typically include a frequent and intense urge to urinate and a painful, burning feeling in the bladder or urethra during urination. Older women and men are more likely to be tired, shaky, and weak and have muscle aches and abdominal pain. A fever may mean the infection is in your kidneys. Other symptoms of a kidney infection include pain in your back or sides below the ribs, nausea, and vomiting.  DIAGNOSIS  To diagnose a UTI, your caregiver will ask you about your symptoms. Your caregiver also will ask to provide a urine sample. The urine sample will be tested for bacteria and white blood cells. White blood cells are made by your body to help fight infection.  TREATMENT   Typically, UTIs can be treated with medication. Because most UTIs are caused by a bacterial infection, they usually can be treated with the use of antibiotics. The choice of antibiotic and length of treatment depend on your symptoms and the type of bacteria causing your infection.  HOME CARE INSTRUCTIONS   If you were prescribed antibiotics, take them exactly as your caregiver instructs you. Finish the medication even if you feel better after you  have only taken some of the medication.   Drink enough water and fluids to keep your urine clear or pale yellow.   Avoid caffeine, tea, and carbonated beverages. They tend to irritate your bladder.   Empty your bladder often. Avoid holding urine for long periods of time.   Empty your bladder before and after sexual intercourse.   After a bowel movement, women should cleanse from front to back. Use each tissue only once.  SEEK MEDICAL CARE IF:    You have back pain.   You develop a fever.   Your symptoms do not begin to resolve within 3 days.  SEEK IMMEDIATE MEDICAL CARE IF:    You have severe back pain or lower abdominal pain.   You develop chills.   You have nausea or vomiting.   You have continued burning or discomfort with urination.  MAKE SURE YOU:    Understand these instructions.   Will watch your condition.   Will get help right away if you are not doing well or get worse.  Document Released: 09/23/2005 Document Revised: 06/14/2012 Document Reviewed: 01/22/2012  [employer] Patient Information 2014 [employer].

## 2014-01-09 NOTE — Progress Notes (Signed)
   Patient is a 80 year old with history of mild uterine prolapse along with cystocele and rectocele and past history of urinary incontinence. Patient with history of lung cancer and lobectomy. Patient with history of COPD. The patient with disfiguring rheumatoid arthritis. Patient presented today for cleaning and maintenance of her small ring size pessary. Patient had been complaining of abdominal bloating especially to her right lower abdomen. And low back discomfort and some burning with urination but no frequency. Patient denied any fever, chills, nausea, or vomiting. She did have some vulvar pruritus.  Exam: Pessary removed for cleaning Vagina the ring pessary was removed the vaginal mucosa was inspected the cystocele rectocele once again noted. There was no ulceration. Good estrogenization since she has been applying estrogen cream twice a week.  Bimanual exam: Slightly tenderness in the right lower quadrant but no discernible mass on either adnexa.  Ultrasound today: Was normal with endometrial stripe less than 4 mm and atrophic ovaries.   Wet prep demonstrated evidence of yeast along with clue cells and white blood cells and bacteria  Urinalysis demonstrated 11-20 WBC, many bacteria and yeast  Assessment/plan: Patient with mild pelvic organ prolapse cystocele/rectocele doing well with ring pessary was cleaned and placed back. Patient will continue to use the estrogen cream twice a week. For urinary tract infection she will be prescribed Cipro one by mouth twice a day for 3 days. For her yeast infection she'll be prescribed Diflucan 150 mg one by mouth today. Patient will return back in 3 months for pessary maintenance or when necessary. We will contact the urologist to see if they would agree with prophylactically starting this patient on Macrobid 1 by mouth daily because of her current urinary tract infections since she began using the pessary.

## 2014-01-10 LAB — URINE CULTURE
Colony Count: NO GROWTH
Organism ID, Bacteria: NO GROWTH

## 2014-01-12 ENCOUNTER — Ambulatory Visit: Payer: Medicare Other | Admitting: Gynecology

## 2014-01-12 ENCOUNTER — Telehealth: Payer: Self-pay | Admitting: Pulmonary Disease

## 2014-01-12 MED ORDER — TIOTROPIUM BROMIDE MONOHYDRATE 18 MCG IN CAPS: 18.0000 ug | ORAL_CAPSULE | Freq: Every day | RESPIRATORY_TRACT | Status: DC

## 2014-01-12 NOTE — Telephone Encounter (Signed)
Spoke with pt. She is aware of recs. She already has prednisone on hand and did not need RX sent. I advised pt how to take her prednisone. She needed nothing further

## 2014-01-12 NOTE — Telephone Encounter (Signed)
She is supposed to be on symbicort and spiriva everyday, not prn Let her know that when someone has chest heaviness it is hard to know how much is copd and how much may be heart pain (angina).  We can try her on prednisone for 8 days, but if not improving, needs to call her primary md or go to ER.  If pain worsens, or if it persists and doesn't go away, needs to go to ER>

## 2014-01-12 NOTE — Telephone Encounter (Signed)
Spoke with pt. She c/o chest heaviness off and on x 1 week. Denies any cough, no wheezing, no increase SOB. She has been using her symbicort 2 puffs BID and spiriva PRN. She uses her rescue inhaler once every other day. No available openings with any doc today. Please advise [place] thanks Last OV 09/20/13 Allergies  Allergen Reactions  . Aspirin   . Codeine     REACTION: hallucinations  . Demerol   . Meperidine Hcl     REACTION: severe GI upset

## 2014-01-15 ENCOUNTER — Telehealth: Payer: Self-pay | Admitting: *Deleted

## 2014-01-15 ENCOUNTER — Encounter (HOSPITAL_COMMUNITY): Payer: Self-pay | Admitting: Emergency Medicine

## 2014-01-15 ENCOUNTER — Emergency Department (HOSPITAL_COMMUNITY): Payer: Medicare Other

## 2014-01-15 ENCOUNTER — Emergency Department (HOSPITAL_COMMUNITY)
Admission: EM | Admit: 2014-01-15 | Discharge: 2014-01-15 | Disposition: A | Discharge status: Home / Self Care | Payer: Medicare Other | Attending: Emergency Medicine | Admitting: Emergency Medicine

## 2014-01-15 DIAGNOSIS — R0789 Other chest pain: Secondary | ICD-10-CM | POA: Insufficient documentation

## 2014-01-15 DIAGNOSIS — Z85118 Personal history of other malignant neoplasm of bronchus and lung: Secondary | ICD-10-CM | POA: Insufficient documentation

## 2014-01-15 DIAGNOSIS — E079 Disorder of thyroid, unspecified: Secondary | ICD-10-CM | POA: Diagnosis not present

## 2014-01-15 DIAGNOSIS — J441 Chronic obstructive pulmonary disease with (acute) exacerbation: Secondary | ICD-10-CM | POA: Insufficient documentation

## 2014-01-15 DIAGNOSIS — K219 Gastro-esophageal reflux disease without esophagitis: Secondary | ICD-10-CM | POA: Insufficient documentation

## 2014-01-15 DIAGNOSIS — M069 Rheumatoid arthritis, unspecified: Secondary | ICD-10-CM | POA: Diagnosis not present

## 2014-01-15 DIAGNOSIS — C3490 Malignant neoplasm of unspecified part of unspecified bronchus or lung: Secondary | ICD-10-CM | POA: Diagnosis not present

## 2014-01-15 DIAGNOSIS — Z87891 Personal history of nicotine dependence: Secondary | ICD-10-CM | POA: Diagnosis not present

## 2014-01-15 DIAGNOSIS — Z79899 Other long term (current) drug therapy: Secondary | ICD-10-CM | POA: Diagnosis not present

## 2014-01-15 DIAGNOSIS — F411 Generalized anxiety disorder: Secondary | ICD-10-CM | POA: Diagnosis not present

## 2014-01-15 DIAGNOSIS — R7309 Other abnormal glucose: Secondary | ICD-10-CM | POA: Diagnosis not present

## 2014-01-15 HISTORY — DX: Gastro-esophageal reflux disease without esophagitis: K21.9

## 2014-01-15 HISTORY — DX: Disorder of thyroid, unspecified: E07.9

## 2014-01-15 HISTORY — DX: Pure hypercholesterolemia, unspecified: E78.00

## 2014-01-15 LAB — URINALYSIS, ROUTINE W REFLEX MICROSCOPIC
Bilirubin Urine: NEGATIVE
Glucose, UA: NEGATIVE mg/dL
Hgb urine dipstick: NEGATIVE
Ketones, ur: NEGATIVE mg/dL
Leukocytes, UA: NEGATIVE
Nitrite: NEGATIVE
Protein, ur: NEGATIVE mg/dL
Specific Gravity, Urine: 1.007 (ref 1.005–1.030)
Urobilinogen, UA: 0.2 mg/dL (ref 0.0–1.0)
pH: 6 (ref 5.0–8.0)

## 2014-01-15 LAB — CBC
HCT: 36.2 % (ref 36.0–46.0)
Hemoglobin: 12.2 g/dL (ref 12.0–15.0)
MCH: 34 pg (ref 26.0–34.0)
MCHC: 33.7 g/dL (ref 30.0–36.0)
MCV: 100.8 fL — ABNORMAL HIGH (ref 78.0–100.0)
Platelets: 281 10*3/uL (ref 150–400)
RBC: 3.59 MIL/uL — ABNORMAL LOW (ref 3.87–5.11)
RDW: 15.7 % — ABNORMAL HIGH (ref 11.5–15.5)
WBC: 9.2 10*3/uL (ref 4.0–10.5)

## 2014-01-15 LAB — BASIC METABOLIC PANEL
BUN: 12 mg/dL (ref 6–23)
CO2: 24 mEq/L (ref 19–32)
Calcium: 8.8 mg/dL (ref 8.4–10.5)
Chloride: 98 mEq/L (ref 96–112)
Creatinine, Ser: 0.82 mg/dL (ref 0.50–1.10)
GFR calc Af Amer: 77 mL/min — ABNORMAL LOW (ref >90)
GFR calc non Af Amer: 66 mL/min — ABNORMAL LOW (ref >90)
Glucose, Bld: 225 mg/dL — ABNORMAL HIGH (ref 70–99)
Potassium: 3.9 mEq/L (ref 3.7–5.3)
Sodium: 136 mEq/L — ABNORMAL LOW (ref 137–147)

## 2014-01-15 LAB — POCT I-STAT TROPONIN I: Troponin i, poc: 0 ng/mL (ref 0.00–0.08)

## 2014-01-15 LAB — PRO B NATRIURETIC PEPTIDE: Pro B Natriuretic peptide (BNP): 418.9 pg/mL (ref 0–450)

## 2014-01-15 NOTE — ED Provider Notes (Signed)
CSN: [id number]     Arrival date & time 01/15/14  1755 History   First MD Initiated Contact with Patient 01/15/14 2128     Chief Complaint  Patient presents with  . Chest Pain  . Shortness of Breath   (Consider location/radiation/quality/duration/timing/severity/associated sxs/prior Treatment) HPI 80 yo female presents with 6 day hx of "chest pressure" that is intermittent. Describesw pressure as a "band-like" pressure in the "bra-line" distribution. Patient rates her pressure sensation as 5/10 currently but usually around 7/10. Patient has been having these symptoms off and on since she had her RIGHT lobectomy in 2005. Patient reports approximately 2 episodes a month that typically resolve on there own. Patient denies any pattern to her symptoms, they can last for hours or days. Symptoms are not reported to be worse with activity.Symptoms improved with her inhaler use.  Patient has chronic dyspnea but denies any acute change today. Patient states she was put on Prednisone Friday for COPD exacerbation. Patient states she feels a bit better since admission to the ED. PMH significant for Lung Cancer, COPD, RA, GERD, Hypercholesterolemia. Denies recent travel, hx of blood clot, recent surgery, trauma. Patient denies hx of CAD.  Past Medical History  Diagnosis Date  . Cancer     lung ca  . COPD (chronic obstructive pulmonary disease)   . Dyspnea   . Rheumatoid arthritis(714.0)   . GERD (gastroesophageal reflux disease)   . Thyroid disease   . Hypercholesteremia    Past Surgical History  Procedure Laterality Date  . Right lobectomy  10/2004  . Appendectomy     Family History  Problem Relation Age of Onset  . Emphysema Mother   . Asthma Mother   . Lung cancer Father   . Pancreatic cancer Father   . Bone cancer Father   . Heart disease Brother     x1  . Heart disease Sister     x2  . Stroke Maternal Grandfather    History  Substance Use Topics  . Smoking status: Former Smoker --  1.00 packs/day for 20 years    Types: Cigarettes    Quit date: 12/28/2002  . Smokeless tobacco: Not on file  . Alcohol Use: No   OB History   Grav Para Term Preterm Abortions TAB SAB Ect Mult Living   5 3   2  1 1  3      Review of Systems  Allergies  Aspirin; Codeine; Demerol; and Meperidine hcl  Home Medications   Current Outpatient Rx  Name  Route  Sig  Dispense  Refill  . albuterol (PROAIR HFA) 108 (90 BASE) MCG/ACT inhaler   Inhalation   Inhale 2 puffs into the lungs every 4 (four) hours as needed for wheezing or shortness of breath.         . budesonide-formoterol (SYMBICORT) 160-4.5 MCG/ACT inhaler   Inhalation   Inhale 2 puffs into the lungs 2 (two) times daily.   3 Inhaler   3   . diazepam (VALIUM) 5 MG tablet   Oral   Take 5 mg by mouth daily as needed for anxiety.          . folic acid (FOLVITE) 1 MG tablet   Oral   Take 1 mg by mouth daily.         Marland Kitchen levothyroxine (SYNTHROID, LEVOTHROID) 88 MCG tablet   Oral   Take 88 mcg by mouth daily.         Marland Kitchen loratadine (CLARITIN) 10 MG tablet  Oral   Take 10 mg by mouth daily.         . methotrexate 25 MG/ML SOLN   Subcutaneous   Inject 8.08 mg into the skin once a week.          . NONFORMULARY OR COMPOUNDED ITEM      Estradiol .02% 1 ML Prefilled Applicator Sig: apply vaginally twice a week #90 Day Supply with 4 refills   1 each   4   . omeprazole (PRILOSEC) 20 MG capsule   Oral   Take 20 mg by mouth 2 (two) times daily.         . sertraline (ZOLOFT) 50 MG tablet   Oral   Take 25 mg by mouth daily.         Marland Kitchen tiotropium (SPIRIVA) 18 MCG inhalation capsule   Inhalation   Place 1 capsule (18 mcg total) into inhaler and inhale daily.   90 capsule   1    BP 151/74  Pulse 55  Temp(Src) 97.8 F (36.6 C) (Oral)  Resp 13  SpO2 96% Physical Exam  Nursing note and vitals reviewed. Constitutional: She is oriented to person, place, and time. She appears well-developed and  well-nourished. No distress.  HENT:  Head: Normocephalic and atraumatic.  Eyes: Conjunctivae and EOM are normal.  Neck: Trachea normal, normal range of motion and phonation normal. Neck supple. No JVD present. No rigidity. No tracheal deviation and normal range of motion present.  Cardiovascular: Normal rate, regular rhythm and intact distal pulses.  Exam reveals no gallop and no friction rub.   No murmur heard. Pulmonary/Chest: Effort normal. No accessory muscle usage or stridor. Not tachypneic. No respiratory distress. She has no wheezes. She has no rhonchi. She has no rales.  Musculoskeletal: Normal range of motion. She exhibits no edema.  Neurological: She is alert and oriented to person, place, and time.  Skin: Skin is warm and dry. She is not diaphoretic.  Psychiatric: Her behavior is normal. Her mood appears anxious.    ED Course  Procedures (including critical care time) Labs Review Labs Reviewed  CBC - Abnormal; Notable for the following:    RBC 3.59 (*)    MCV 100.8 (*)    RDW 15.7 (*)    All other components within normal limits  BASIC METABOLIC PANEL - Abnormal; Notable for the following:    Sodium 136 (*)    Glucose, Bld 225 (*)    GFR calc non Af Amer 66 (*)    GFR calc Af Amer 77 (*)    All other components within normal limits  PRO B NATRIURETIC PEPTIDE  URINALYSIS, ROUTINE W REFLEX MICROSCOPIC  POCT I-STAT TROPONIN I   Imaging Review Dg Chest 2 View  01/15/2014   CLINICAL DATA:  Shortness of breath. Chest pressure. COPD. Lung cancer. Ex-smoker.  EXAM: CHEST  2 VIEW  COMPARISON:  DG CHEST 2 VIEW dated 01/16/2013; DG CHEST 2 VIEW dated 04/20/2012  FINDINGS: Patient rotated to the right. Normal heart size with a mildly tortuous thoracic aorta. Right hemidiaphragm elevation with surgical clips in the medial right hemi thorax. Volume loss throughout the right chest, similar. Right apical pleural parenchymal scarring. No pneumothorax. No superimposed airspace disease.  Diffuse peribronchial thickening.  IMPRESSION: Similar appearance of postoperative changes in the right hemi thorax. No acute superimposed process.   Electronically Signed   By: Abigail Miyamoto M.D.   On: 01/15/2014 19:38    EKG Interpretation    Date/Time:  Monday  January 15 2014 18:00:50 EST Ventricular Rate:  63 PR Interval:  174 QRS Duration: 134 QT Interval:  444 QTC Calculation: 829 R Axis:   27 Text Interpretation:  Normal sinus rhythm Non-specific intra-ventricular conduction block T wave abnormality, consider lateral ischemia Compared to previous tracing QRS duration has increased Non-specific ST-t changes NOW PRESENT Confirmed by Stevie Kern  MD, JOHN ([id number]) on 01/15/2014 10:02:32 PM            MDM   1. Chest pain, atypical   2. Elevated glucose    EKG shows non specific ST-T changes new from previous. Troponin negative despite pain ongoing > 24 hours.  CXR shows no acute abnormalities when compared to previous study.  BNP normal.  Glucose elevated today at 225. Patient currently on prednisone, but no hx of significantly elevated glucose levels. Patient advised to have rechecked by PCP in 2 days.   Patient appears in NAD. Patient speaking in full sentences without labored breathing or apparent respiratory distress. Patient denies shortness of breath different from her baseline. O2 sat is > 96% on RA throughout patient's stay in ED. BP mildly elevated, suspect secondary to patient being anxious and possibly result of prednisone use. Doubt cardiac ischemia given patient presentation and workup.   Patient discussed with Dr. Riki Altes.    Advised follow up with your PCP in 2 days for reevaluation and recheck of blood glucose. Should your chest pain return or worsen or you develop progressive shortness of breath please return to the ED. Patient agrees with plan. Discharged in good condition.          Sherrie George, PA-C 01/16/14 1255

## 2014-01-15 NOTE — ED Notes (Signed)
Pt reports central chest pressure x 5 days with intermittent radiation to left shoulder. States she always has pressure but reports intermittent pain. Reports SOB, states constant SOB with COPD but increased recently with CP. Worse with ambulation.

## 2014-01-15 NOTE — Telephone Encounter (Signed)
Appointment on 01/15/14 @ 10:45 am left message for pt to call.

## 2014-01-15 NOTE — ED Provider Notes (Signed)
Medical screening examination/treatment/procedure(s) were conducted as a shared visit with non-physician practitioner(s) and myself.  I personally evaluated the patient during the encounter.  EKG Interpretation    Date/Time:  Monday January 15 2014 18:00:50 EST Ventricular Rate:  63 PR Interval:  174 QRS Duration: 134 QT Interval:  444 QTC Calculation: 454 R Axis:   27 Text Interpretation:  Normal sinus rhythm Non-specific intra-ventricular conduction block T wave abnormality, consider lateral ischemia Compared to previous tracing QRS duration has increased Non-specific ST-t changes NOW PRESENT Confirmed by Stevie Kern  MD, Rosaline Ezekiel [phone]) on 01/15/2014 10:02:32 PM           Several days constant vague chest pressure feeling almost 24 hours a day and has these spells twice a month for the last 10 years; nonexertional nonpleuritic non-positional; has been increased inhalers and steroids the last several days as well for CP exacerbation.  Babette Relic, MD 01/18/14 [phone]

## 2014-01-15 NOTE — Discharge Instructions (Signed)
Follow up with your PCP in 2 days for reevaluation and recheck of blood glucose. Should your chest pain return or worsen or you develop progressive shortness of breath please return to the ED.

## 2014-01-15 NOTE — Telephone Encounter (Signed)
Message copied by Thamas Jaegers on Mon Jan 15, 2014 10:54 AM ------      Message from: Terrance Mass      Created: Fri Jan 12, 2014  8:36 AM       Spoke with Dr. Edrick Kins today. Please make sure appointment has been made for her. Diag. Persistent microscopic hematuria. Thanks. I aready sent him my notes. Just send him her all the labs we have. JF ------

## 2014-01-15 NOTE — ED Notes (Signed)
Pt sent here from PMD with consistent chest pressure for 5 days with intermittent pain, pt reports sob and is anxious

## 2014-01-17 ENCOUNTER — Telehealth: Payer: Self-pay

## 2014-01-17 NOTE — Telephone Encounter (Signed)
Patient called stating she was returning Jennifer's call. I see documented Anderson Malta had left message for her with urology appt 01/15/14.  Patient apologized and said she has been really sick and was in ER on 01/15/14. Just now checking messages.  I told her I will let Anderson Malta know. She said to let Anderson Malta know to  wait at least a week or so before rescheduling her appointment.

## 2014-01-17 NOTE — Telephone Encounter (Signed)
Correction appointment is on 01/25/14 @ 10:45 am

## 2014-01-18 NOTE — Telephone Encounter (Signed)
Correction appointment is on 01/25/14 @ 10:45 am

## 2014-01-19 DIAGNOSIS — R7309 Other abnormal glucose: Secondary | ICD-10-CM | POA: Diagnosis not present

## 2014-01-19 DIAGNOSIS — J449 Chronic obstructive pulmonary disease, unspecified: Secondary | ICD-10-CM | POA: Diagnosis not present

## 2014-01-23 NOTE — Telephone Encounter (Signed)
Pt called requesting appointment rescheduled to 02/13/13 @ 1:00 pm. Pt informed with the below note

## 2014-02-06 DIAGNOSIS — D239 Other benign neoplasm of skin, unspecified: Secondary | ICD-10-CM | POA: Diagnosis not present

## 2014-02-06 DIAGNOSIS — B86 Scabies: Secondary | ICD-10-CM | POA: Diagnosis not present

## 2014-02-13 DIAGNOSIS — N8110 Cystocele, unspecified: Secondary | ICD-10-CM | POA: Diagnosis not present

## 2014-02-13 DIAGNOSIS — R3129 Other microscopic hematuria: Secondary | ICD-10-CM | POA: Diagnosis not present

## 2014-03-13 DIAGNOSIS — N393 Stress incontinence (female) (male): Secondary | ICD-10-CM | POA: Diagnosis not present

## 2014-03-13 DIAGNOSIS — R3129 Other microscopic hematuria: Secondary | ICD-10-CM | POA: Diagnosis not present

## 2014-03-20 ENCOUNTER — Ambulatory Visit (INDEPENDENT_AMBULATORY_CARE_PROVIDER_SITE_OTHER): Payer: Medicare Other | Admitting: Pulmonary Disease

## 2014-03-20 ENCOUNTER — Encounter: Payer: Self-pay | Admitting: Pulmonary Disease

## 2014-03-20 VITALS — BP 138/72 | HR 67 | Temp 98.1°F | Ht 64.75 in | Wt 163.6 lb

## 2014-03-20 DIAGNOSIS — J449 Chronic obstructive pulmonary disease, unspecified: Secondary | ICD-10-CM

## 2014-03-20 DIAGNOSIS — J4489 Other specified chronic obstructive pulmonary disease: Secondary | ICD-10-CM

## 2014-03-20 NOTE — Assessment & Plan Note (Signed)
The patient is doing fairly well overall on Symbicort alone, but I have offered to add back a different anti-cholinergic treatment if she feels dissatisfied with her current exertional tolerance.  At this point, she would like to hold off and see how she does over time. I have made referral to pulmonary rehabilitation to help with weight loss and conditioning, and the patient is agreeable to this.

## 2014-03-20 NOTE — Progress Notes (Signed)
   Subjective:    Patient ID: Lisa Vega, female    DOB: 01-Oct-1932, 80 y.o.   MRN: [id number]  HPI Patient comes in today for followup of her known COPD. Spiriva was added at her last visit, but she felt like it your taper throat and discontinued. She did have an episode of chest tightness, and it was unclear whether this was cardiac versus pulmonary versus reflux in origin. She was treated with a course of prednisone, and had an admission to the hospital for hypertension and hyperglycemia. She continues to have a little bit of chest tightness, but is also having regurgitation after eating. I will leave this to her primary physician to sort out if there is a cardiac or GI component here. I really do not think it is related to her pulmonary issues.   Review of Systems  Constitutional: Negative for fever and unexpected weight change.  HENT: Positive for voice change. Negative for congestion, dental problem, ear pain, nosebleeds, postnasal drip, rhinorrhea, sinus pressure, sneezing, sore throat and trouble swallowing.   Eyes: Negative for redness and itching.  Respiratory: Positive for cough, shortness of breath and wheezing. Negative for chest tightness.   Cardiovascular: Negative for palpitations and leg swelling.  Gastrointestinal: Negative for nausea and vomiting.  Genitourinary: Negative for dysuria.  Musculoskeletal: Negative for joint swelling.  Skin: Negative for rash.  Neurological: Negative for headaches.  Hematological: Does not bruise/bleed easily.  Psychiatric/Behavioral: Negative for dysphoric mood. The patient is not nervous/anxious.        Objective:   Physical Exam Well-developed female in no acute distress Nose without purulence or discharge noted Neck without lymphadenopathy or thyromegaly Chest with good airflow, no wheezing noted Cardiac exam with regular rate and rhythm Lower extremities without significant edema, no cyanosis Alert and oriented, moves all 4  extremities.       Assessment & Plan:

## 2014-03-20 NOTE — Patient Instructions (Signed)
Stay on symbicort, but let us know if you would like to add additional therapy to see if it will help your breathing. Will send referral to pulmonary rehab.  Please call us if you do not hear from them. followup with me again in 14mos.

## 2014-03-22 DIAGNOSIS — L678 Other hair color and hair shaft abnormalities: Secondary | ICD-10-CM | POA: Diagnosis not present

## 2014-03-22 DIAGNOSIS — D235 Other benign neoplasm of skin of trunk: Secondary | ICD-10-CM | POA: Diagnosis not present

## 2014-03-22 DIAGNOSIS — L738 Other specified follicular disorders: Secondary | ICD-10-CM | POA: Diagnosis not present

## 2014-03-22 DIAGNOSIS — L570 Actinic keratosis: Secondary | ICD-10-CM | POA: Diagnosis not present

## 2014-03-22 DIAGNOSIS — B079 Viral wart, unspecified: Secondary | ICD-10-CM | POA: Diagnosis not present

## 2014-03-30 ENCOUNTER — Telehealth (HOSPITAL_COMMUNITY): Payer: Self-pay

## 2014-03-30 NOTE — Telephone Encounter (Signed)
I have called and left a message with [facility] to inquire about participation in Pulmonary Rehab. Will send letter in mail and follow up.

## 2014-04-11 DIAGNOSIS — Z79899 Other long term (current) drug therapy: Secondary | ICD-10-CM | POA: Diagnosis not present

## 2014-04-11 DIAGNOSIS — M069 Rheumatoid arthritis, unspecified: Secondary | ICD-10-CM | POA: Diagnosis not present

## 2014-04-11 DIAGNOSIS — M2550 Pain in unspecified joint: Secondary | ICD-10-CM | POA: Diagnosis not present

## 2014-04-16 DIAGNOSIS — M069 Rheumatoid arthritis, unspecified: Secondary | ICD-10-CM | POA: Diagnosis not present

## 2014-04-18 DIAGNOSIS — L719 Rosacea, unspecified: Secondary | ICD-10-CM | POA: Diagnosis not present

## 2014-04-24 ENCOUNTER — Ambulatory Visit (INDEPENDENT_AMBULATORY_CARE_PROVIDER_SITE_OTHER): Payer: Medicare Other | Admitting: Gynecology

## 2014-04-24 ENCOUNTER — Encounter: Payer: Self-pay | Admitting: Gynecology

## 2014-04-24 VITALS — BP 124/80 | Ht 62.5 in | Wt 162.0 lb

## 2014-04-24 DIAGNOSIS — N951 Menopausal and female climacteric states: Secondary | ICD-10-CM

## 2014-04-24 DIAGNOSIS — IMO0002 Reserved for concepts with insufficient information to code with codable children: Secondary | ICD-10-CM

## 2014-04-24 DIAGNOSIS — N949 Unspecified condition associated with female genital organs and menstrual cycle: Secondary | ICD-10-CM

## 2014-04-24 DIAGNOSIS — N8111 Cystocele, midline: Secondary | ICD-10-CM

## 2014-04-24 DIAGNOSIS — N819 Female genital prolapse, unspecified: Secondary | ICD-10-CM

## 2014-04-24 DIAGNOSIS — N952 Postmenopausal atrophic vaginitis: Secondary | ICD-10-CM

## 2014-04-24 DIAGNOSIS — N816 Rectocele: Secondary | ICD-10-CM

## 2014-04-24 DIAGNOSIS — R102 Pelvic and perineal pain: Secondary | ICD-10-CM

## 2014-04-24 DIAGNOSIS — N898 Other specified noninflammatory disorders of vagina: Secondary | ICD-10-CM

## 2014-04-24 DIAGNOSIS — R82998 Other abnormal findings in urine: Secondary | ICD-10-CM | POA: Diagnosis not present

## 2014-04-24 LAB — URINALYSIS W MICROSCOPIC + REFLEX CULTURE
Bilirubin Urine: NEGATIVE
Casts: NONE SEEN
Crystals: NONE SEEN
Glucose, UA: NEGATIVE mg/dL
Ketones, ur: NEGATIVE mg/dL
Nitrite: NEGATIVE
Protein, ur: NEGATIVE mg/dL
Specific Gravity, Urine: <1.005 — ABNORMAL LOW (ref 1.005–1.030)
Urobilinogen, UA: 0.2 mg/dL (ref 0.0–1.0)
pH: 5 (ref 5.0–8.0)

## 2014-04-24 LAB — WET PREP FOR TRICH, YEAST, CLUE
Clue Cells Wet Prep HPF POC: NONE SEEN
Trich, Wet Prep: NONE SEEN
Yeast Wet Prep HPF POC: NONE SEEN

## 2014-04-24 MED ORDER — NONFORMULARY OR COMPOUNDED ITEM: Status: DC

## 2014-04-24 NOTE — Progress Notes (Signed)
Lisa Vega [birth year] [id number]   History:    80 y.o.  for GYN followup. Patient was having some pelvic pressure. Patient questionable discharge.Patient has a history of mild uterine prolapse along with cystocele rectocele and had had urinary incontinence. The patient has had history of lung cancer and lobectomy. She also has history of COPD. The patient is applying vaginal estrogen twice a week and has been doing well. Due to the patient's multiple comorbidities and history of uterine prolapse (cystocele, rectocele and urinary incontinence) has done well with a ring pessary. Patient's primary physician has been Dr. Milly Jakob who has been doing her blood work. Patient's last colonoscopy was in 2011. Patient with past history of colon polyps. Patient with no prior history of abnormal Pap smear. Last mammogram 2001.    Past medical history,surgical history, family history and social history were all reviewed and documented in the EPIC chart.  Gynecologic History No LMP recorded. Patient is postmenopausal. Contraception: post menopausal status Last Pap: 2005. Results were: normal Last mammogram: 2001. Results were: normal  Obstetric History OB History  Gravida Para Term Preterm AB SAB TAB Ectopic Multiple Living  5 3   2 1  1  3     # Outcome Date GA Lbr Len/2nd Weight Sex Delivery Anes PTL Lv  5 ECT           4 SAB           3 PAR           2 PAR           1 PAR                ROS: A ROS was performed and pertinent positives and negatives are included in the history.  GENERAL: No fevers or chills. HEENT: No change in vision, no earache, sore throat or sinus congestion. NECK: No pain or stiffness. CARDIOVASCULAR: No chest pain or pressure. No palpitations. PULMONARY: No shortness of breath, cough or wheeze. GASTROINTESTINAL: No abdominal pain, nausea, vomiting or diarrhea, melena or bright red blood per rectum. GENITOURINARY: No urinary frequency, urgency, hesitancy or dysuria.  MUSCULOSKELETAL: No joint or muscle pain, no back pain, no recent trauma. DERMATOLOGIC: No rash, no itching, no lesions. ENDOCRINE: No polyuria, polydipsia, no heat or cold intolerance. No recent change in weight. HEMATOLOGICAL: No anemia or easy bruising or bleeding. NEUROLOGIC: No headache, seizures, numbness, tingling or weakness. PSYCHIATRIC: No depression, no loss of interest in normal activity or change in sleep pattern.     Exam: chaperone present  BP 124/80  Ht 5' 2.5" (1.588 m)  Wt 162 lb (73.483 kg)  BMI 29.14 kg/m2  Body mass index is 29.14 kg/(m^2).  General appearance : Well developed well nourished female. No acute distress HEENT: Neck supple, trachea midline, no carotid bruits, no thyroidmegaly Lungs: Clear to auscultation, no rhonchi or wheezes, or rib retractions  Heart: Regular rate and rhythm, no murmurs or gallops Breast:Examined in sitting and supine position were symmetrical in appearance, no palpable masses or tenderness,  no skin retraction, no nipple inversion, no nipple discharge, no skin discoloration, no axillary or supraclavicular lymphadenopathy Abdomen: no palpable masses or tenderness, no rebound or guarding Extremities: no edema or skin discoloration or tenderness  Pelvic:  Bartholin, Urethra, Skene Glands: Within normal limits             Vagina: No gross lesions or discharge, atrophic changes, second-degree cystocele and second-degree rectocele and first degree uterine descensus  Cervix: No gross lesions or discharge  Uterus  axial, normal size, shape and consistency, non-tender and mobile  Adnexa  Without masses or tenderness  Anus and perineum  normal   Rectovaginal  normal sphincter tone without palpated masses or tenderness             Hemoccult PCP provides   Urinalysis: WBC 3-6, bacteria few The pessary had been removed and cleansed and placed back into the vagina. There was no erosion minimal vaginal atrophy good  lubrication.  Assessment/Plan:  80 y.o. female with multiple comorbidities and pelvic organ prolapse doing well with ring pessary. Patient will continue to apply vaginal estrogen twice a week and return to the office every 3 months for pessary maintenance. We will send her urine for culture. Her wet prep is otherwise negative. She was scheduled bone density study here in our office. Patient no longer needs Pap smear in accordance to the new guidelines. Patient will be drawn her blood work.  Note: This dictation was prepared with  Dragon/digital dictation along withSmart phrase technology. Any transcriptional errors that result from this process are unintentional.   Terrance Mass MD, 3:27 PM 04/24/2014

## 2014-04-24 NOTE — Patient Instructions (Signed)
Bone Densitometry Bone densitometry is a special X-ray that measures your bone density and can be used to help predict your risk of bone fractures. This test is used to determine bone mineral content and density to diagnose osteoporosis. Osteoporosis is the loss of bone that may cause the bone to become weak. Osteoporosis commonly occurs in women entering menopause. However, it may be found in men and in people with other diseases. PREPARATION FOR TEST No preparation necessary. WHO SHOULD BE TESTED?  All women older than 65.  Postmenopausal women (50 to 65) with risk factors for osteoporosis.  People with a previous fracture caused by normal activities.  People with a small body frame (less than 127 poundsor a body mass index [BMI] of less than 21).  People who have a parent with a hip fracture or history of osteoporosis.  People who smoke.  People who have rheumatoid arthritis.  Anyone who engages in excessive alcohol use (more than 3 drinks most days).  Women who experience early menopause. WHEN SHOULD YOU BE RETESTED? Current guidelines suggest that you should wait at least 2 years before doing a bone density test again if your first test was normal.Recent studies indicated that women with normal bone density may be able to wait a few years before needing to repeat a bone density test. You should discuss this with your caregiver.  NORMAL FINDINGS   Normal: less than standard deviation below normal (greater than -1).  Osteopenia: 1 to 2.5 standard deviations below normal (-1 to -2.5).  Osteoporosis: greater than 2.5 standard deviations below normal (less than -2.5). Test results are reported as a "T score" and a "Z score."The T score is a number that compares your bone density with the bone density of healthy, young women.The Z score is a number that compares your bone density with the scores of women who are the same age, gender, and race.  Ranges for normal findings may vary  among different laboratories and hospitals. You should always check with your doctor after having lab work or other tests done to discuss the meaning of your test results and whether your values are considered within normal limits. MEANING OF TEST  Your caregiver will go over the test results with you and discuss the importance and meaning of your results, as well as treatment options and the need for additional tests if necessary. OBTAINING THE TEST RESULTS It is your responsibility to obtain your test results. Ask the lab or department performing the test when and how you will get your results. Document Released: 01/05/2005 Document Revised: 03/07/2012 Document Reviewed: 01/28/2011 [employer] Patient Information 2014 [employer].  

## 2014-04-25 LAB — URINE CULTURE
Colony Count: NO GROWTH
Organism ID, Bacteria: NO GROWTH

## 2014-04-27 ENCOUNTER — Telehealth: Payer: Self-pay | Admitting: *Deleted

## 2014-04-27 MED ORDER — FLUCONAZOLE 150 MG PO TABS: 150.0000 mg | ORAL_TABLET | Freq: Once | ORAL | Status: DC

## 2014-04-27 NOTE — Telephone Encounter (Signed)
Please call a prescription for Diflucan 150 mg one by mouth with 3 refills

## 2014-04-27 NOTE — Telephone Encounter (Signed)
Pt informed, rx sent 

## 2014-04-27 NOTE — Telephone Encounter (Signed)
Pt was seen on 04/24/14 for annual , c/o vaginal itching only. No urination issues, nor any form of discharge. Pt asked if you would be willing to give Diflucan tablet?

## 2014-05-10 ENCOUNTER — Ambulatory Visit (INDEPENDENT_AMBULATORY_CARE_PROVIDER_SITE_OTHER): Payer: Medicare Other

## 2014-05-10 ENCOUNTER — Other Ambulatory Visit: Payer: Self-pay | Admitting: Gynecology

## 2014-05-10 DIAGNOSIS — M069 Rheumatoid arthritis, unspecified: Secondary | ICD-10-CM | POA: Diagnosis not present

## 2014-05-10 DIAGNOSIS — M949 Disorder of cartilage, unspecified: Secondary | ICD-10-CM | POA: Diagnosis not present

## 2014-05-10 DIAGNOSIS — M899 Disorder of bone, unspecified: Secondary | ICD-10-CM | POA: Diagnosis not present

## 2014-05-10 DIAGNOSIS — M810 Age-related osteoporosis without current pathological fracture: Secondary | ICD-10-CM

## 2014-05-10 DIAGNOSIS — M8580 Other specified disorders of bone density and structure, unspecified site: Secondary | ICD-10-CM

## 2014-05-10 DIAGNOSIS — N951 Menopausal and female climacteric states: Secondary | ICD-10-CM

## 2014-05-14 ENCOUNTER — Other Ambulatory Visit: Payer: Self-pay | Admitting: Gynecology

## 2014-05-14 ENCOUNTER — Telehealth (HOSPITAL_COMMUNITY): Payer: Self-pay | Admitting: *Deleted

## 2014-05-14 ENCOUNTER — Inpatient Hospital Stay (HOSPITAL_COMMUNITY): Admission: RE | Admit: 2014-05-14 | Payer: Medicare Other | Source: Ambulatory Visit

## 2014-05-14 DIAGNOSIS — M069 Rheumatoid arthritis, unspecified: Secondary | ICD-10-CM

## 2014-05-14 DIAGNOSIS — M810 Age-related osteoporosis without current pathological fracture: Secondary | ICD-10-CM

## 2014-05-17 ENCOUNTER — Other Ambulatory Visit: Payer: Self-pay | Admitting: *Deleted

## 2014-05-17 DIAGNOSIS — M8580 Other specified disorders of bone density and structure, unspecified site: Secondary | ICD-10-CM

## 2014-05-23 ENCOUNTER — Other Ambulatory Visit: Payer: Medicare Other

## 2014-05-23 DIAGNOSIS — M8580 Other specified disorders of bone density and structure, unspecified site: Secondary | ICD-10-CM

## 2014-05-23 DIAGNOSIS — M899 Disorder of bone, unspecified: Secondary | ICD-10-CM | POA: Diagnosis not present

## 2014-05-24 ENCOUNTER — Other Ambulatory Visit: Payer: Self-pay | Admitting: Pulmonary Disease

## 2014-05-24 LAB — PTH, INTACT AND CALCIUM
Calcium: 8.8 mg/dL (ref 8.4–10.5)
PTH: 103.8 pg/mL — ABNORMAL HIGH (ref 14.0–72.0)

## 2014-05-24 LAB — VITAMIN D 25 HYDROXY (VIT D DEFICIENCY, FRACTURES): Vit D, 25-Hydroxy: 27 ng/mL — ABNORMAL LOW (ref 30–89)

## 2014-05-29 ENCOUNTER — Telehealth: Payer: Self-pay

## 2014-05-29 ENCOUNTER — Other Ambulatory Visit: Payer: Self-pay | Admitting: Gynecology

## 2014-05-29 DIAGNOSIS — E559 Vitamin D deficiency, unspecified: Secondary | ICD-10-CM

## 2014-05-29 MED ORDER — VITAMIN D (ERGOCALCIFEROL) 1.25 MG (50000 UNIT) PO CAPS: 50000.0000 [IU] | ORAL_CAPSULE | ORAL | Status: DC

## 2014-05-29 NOTE — Telephone Encounter (Signed)
Called patient back and left detailed message on voice mail.

## 2014-05-29 NOTE — Telephone Encounter (Signed)
Patient had been instructed to and has appointment scheduled for tomorrow at 11:30am to discuss labs. Since we talked today per below note she wonders does she still need to come tomorrow for appointment.

## 2014-05-29 NOTE — Telephone Encounter (Signed)
Message copied by Ramond Craver on Tue May 29, 2014 10:22 AM ------      Message from: Terrance Mass      Created: Sat May 26, 2014  5:24 PM       Please call patient and her vitamins was please prescribe vitamin D 50,000 units and I would like for her to take one by mouth q. weekly for 12 weeks. Upon completion of the 12 weeks I would like for her to begin been taking vitamin D3 2000 units every day thereafter. In 3 months I would like to repeat her calcium, vitamin D and PTH level. Her PT is level slightly elevated as a result of her vitamin D deficiency ------

## 2014-05-29 NOTE — Telephone Encounter (Signed)
Correction on note: PTH slightly elevated. Yes she does not need to come in tomorrow

## 2014-05-30 ENCOUNTER — Ambulatory Visit (INDEPENDENT_AMBULATORY_CARE_PROVIDER_SITE_OTHER): Payer: Medicare Other | Admitting: Gynecology

## 2014-05-30 ENCOUNTER — Encounter: Payer: Self-pay | Admitting: Gynecology

## 2014-05-30 VITALS — BP 132/78

## 2014-05-30 DIAGNOSIS — E211 Secondary hyperparathyroidism, not elsewhere classified: Secondary | ICD-10-CM

## 2014-05-30 DIAGNOSIS — M8580 Other specified disorders of bone density and structure, unspecified site: Secondary | ICD-10-CM

## 2014-05-30 DIAGNOSIS — M899 Disorder of bone, unspecified: Secondary | ICD-10-CM | POA: Diagnosis not present

## 2014-05-30 DIAGNOSIS — M949 Disorder of cartilage, unspecified: Secondary | ICD-10-CM

## 2014-05-30 MED ORDER — ALENDRONATE SODIUM 70 MG PO TABS: 70.0000 mg | ORAL_TABLET | ORAL | Status: DC

## 2014-05-30 NOTE — Patient Instructions (Addendum)
Vitamin D Deficiency Vitamin D is an important vitamin that your body needs. Having too little of it in your body is called a deficiency. A very bad deficiency can make your bones soft and can cause a condition called rickets.  Vitamin D is important to your body for different reasons, such as:   It helps your body absorb 2 minerals called calcium and phosphorus.  It helps make your bones healthy. It may prevent some diseases, such as diabetes and multiple sclerosis.  Parathyroid Hormone This is a test to determine whether PTH levels are responding normally to changes in blood calcium levels. It also helps to distinguish the cause of calcium imbalances, and to evaluate parathyroid function. When calcium blood levels are higher or lower than normal, and when your caregiver may want to determine how well your parathyroid glands are working. Parathyroid hormone (PTH) helps the body maintain stable levels of calcium in the blood. It is part of a 'feedback loop' that includes calcium, PTH, vitamin D, and, to some extent, phosphate and magnesium. Conditions and diseases that disrupt this feedback loop can cause inappropriate elevations or decreases in calcium and PTH levels and lead to symptoms of hypercalcemia (raised blood levels of calcium) or hypocalcemia (low blood levels of calcium).  PTH is produced by four parathyroid glands that are located in the neck beside the thyroid gland. Normally, these glands secrete PTH into the bloodstream in response to low blood calcium levels. Parathyroid hormone then works in three ways to help raise blood calcium levels back to normal. It takes calcium from the body's bone, stimulates the activation of vitamin D in the kidney (which in turn increases the absorption of calcium from the intestines), and suppresses the excretion of calcium in the urine (while encouraging excretion of phosphate). As calcium levels begin to increase in the blood, PTH normally  decreases. PREPARATION FOR TEST You should have nothing to eat or drink except for water after midnight on the day of the test or as directed by your caregiver. A blood sample is obtained by inserting a needle into a vein in the arm. NORMAL FINDINGS Conventional Normal  PTH intact (whole)  Assay includes intact PTH  Values (pg/mL)10-65  SI Units (ng/L)10-65  PTH N-terminalN-terminal  Values (pg/mL) 8-24  SI Units (ng/L)8-24  PTH C-terminal  Assay Includes C-terminal  Values (pg/mL) 50-330  SI Units (ng/L) 50-330  Intact PTH  Midmolecule Ranges for normal findings may vary among different laboratories and hospitals. You should always check with your doctor after having lab work or other tests done to discuss the meaning of your test results and whether your values are considered within normal limits. MEANING OF TEST  Your caregiver will go over the test results with you and discuss the importance and meaning of your results, as well as treatment options and the need for additional tests if necessary. OBTAINING THE TEST RESULTS  It is your responsibility to obtain your test results. Ask the lab or department performing the test when and how you will get your results. Document Released: 01/16/2005 Document Revised: 03/07/2012 Document Reviewed: 11/25/2008 [facility] Patient Information 2014 [place], [place].    It helps your muscles and heart. You can get vitamin D in several ways. It is a natural part of some foods. The vitamin is also added to some dairy products and cereals. Some people take vitamin D supplements. Also, your body makes vitamin D when you are in the sun. It changes the sun's rays into a  form of the vitamin that your body can use. CAUSES   Not eating enough foods that contain vitamin D.  Not getting enough sunlight.  Having certain digestive system diseases that make it hard to absorb vitamin D. These diseases include Crohn's disease, chronic  pancreatitis, and cystic fibrosis.  Having a surgery in which part of the stomach or small intestine is removed.  Being obese. Fat cells pull vitamin D out of your blood. That means that obese people may not have enough vitamin D left in their blood and in other body tissues.  Having chronic kidney or liver disease. RISK FACTORS Risk factors are things that make you more likely to develop a vitamin D deficiency. They include:  Being older.  Not being able to get outside very much.  Living in a nursing home.  Having had broken bones.  Having weak or thin bones (osteoporosis).  Having a disease or condition that changes how your body absorbs vitamin D.  Having dark skin.  Some medicines such as seizure medicines or steroids.  Being overweight or obese. SYMPTOMS Mild cases of vitamin D deficiency may not have any symptoms. If you have a very bad case, symptoms may include:  Bone pain.  Muscle pain.  Falling often.  Broken bones caused by a minor injury, due to osteoporosis. DIAGNOSIS A blood test is the best way to tell if you have a vitamin D deficiency. TREATMENT Vitamin D deficiency can be treated in different ways. Treatment for vitamin D deficiency depends on what is causing it. Options include:  Taking vitamin D supplements.  Taking a calcium supplement. Your caregiver will suggest what dose is best for you. HOME CARE INSTRUCTIONS  Take any supplements that your caregiver prescribes. Follow the directions carefully. Take only the suggested amount.  Have your blood tested 2 months after you start taking supplements.  Eat foods that contain vitamin D. Healthy choices include:  Fortified dairy products, cereals, or juices. Fortified means vitamin D has been added to the food. Check the label on the package to be sure.  Fatty fish like salmon or trout.  Eggs.  Oysters.  Do not use a tanning bed.  Keep your weight at a healthy level. Lose weight if you  need to.  Keep all follow-up appointments. Your caregiver will need to perform blood tests to make sure your vitamin D deficiency is going away. SEEK MEDICAL CARE IF:  You have any questions about your treatment.  You continue to have symptoms of vitamin D deficiency.  You have nausea or vomiting.  You are constipated.  You feel confused.  You have severe abdominal or back pain. MAKE SURE YOU:  Understand these instructions.  Will watch your condition.  Will get help right away if you are not doing well or get worse. Document Released: 03/07/2012 Document Revised: 04/10/2013 Document Reviewed: 03/07/2012 [facility] Patient Information 2014 [place]. Alendronate effervescent oral tablets What is this medicine? ALENDRONATE (a LEN droe nate) slows calcium loss from bones. It helps to make healthy bone and to slow bone loss in people with osteoporosis. This medicine may be used for other purposes; ask your health care provider or pharmacist if you have questions. COMMON BRAND NAME(S): Binosto What should I tell my health care provider before I take this medicine? They need to know if you have any of these conditions: -esophagus, stomach, or intestine problems, like acid-reflux or GERD -dental disease -heart disease -high blood pressure -kidney disease -low blood calcium -low vitamin  D -problems swallowing -problems sitting or standing for 30 minutes -an unusual or allergic reaction to alendronate, other medicines, foods, dyes, or preservatives -pregnant or trying to get pregnant -breast-feeding How should I use this medicine? You must take this medicine exactly as directed or you will lower the amount of medicine you absorb into your body or you may cause yourself harm. Take your dose by mouth first thing in the morning, after you are up for the day. Do not eat or drink anything before you take this medicine. Dissolve your medicine in a glass (4 fluid ounces) of plain,  room temperature water. Do not take this tablet with any other drink. Wait at least 5 minutes after the medicine has dissolved and then stir for 10 seconds and drink. After taking this medicine, do not eat breakfast, drink, or take any medicines or vitamins for at least 30 minutes. Stand or sit up for at least 30 minutes after you take this medicine; do not lie down. Take this medicine on the same day every week. Do not take your medicine more often than directed. Talk to your pediatrician regarding the use of this medicine in children. Special care may be needed. Overdosage: If you think you've taken too much of this medicine contact a poison control center or emergency room at once. Overdosage: If you think you have taken too much of this medicine contact a poison control center or emergency room at once. NOTE: This medicine is only for you. Do not share this medicine with others. What if I miss a dose? If you miss a dose, take the dose on the morning after you remember. Then take your next dose on your regular day of the week. Never take 2 tablets on the same day. Do not take double or extra doses. What may interact with this medicine? -aluminum hydroxide -antacids -aspirin -calcium supplements -drugs for inflammation like ibuprofen, naproxen, and others -iron supplements -magnesium supplements -vitamins with minerals This list may not describe all possible interactions. Give your health care provider a list of all the medicines, herbs, non-prescription drugs, or dietary supplements you use. Also tell them if you smoke, drink alcohol, or use illegal drugs. Some items may interact with your medicine. What should I watch for while using this medicine? Visit your doctor or health care professional for regular checks ups. It may be some time before you see benefit from this medicine. Do not stop taking your medication except on your doctor's advice. Your doctor or health care professional may order  blood tests and other tests to see how you are doing. You should make sure you get enough calcium and vitamin D while you are taking this medicine, unless your doctor tells you not to. Discuss the foods you eat and the vitamins you take with your health care professional. Some people who take this medicine have severe bone, joint, and/or muscle pain. This medicine may also increase your risk for a broken thigh bone. Tell your doctor right away if you have pain in your upper leg or groin. Tell your doctor if you have any pain that does not go away or that gets worse. This medicine can make you more sensitive to the sun. If you get a rash while taking this medicine, sunlight may cause the rash to get worse. Keep out of the sun. If you cannot avoid being in the sun, wear protective clothing and use sunscreen. Do not use sun lamps or tanning beds/booths. What side effects may  I notice from receiving this medicine? Side effects that you should report to your doctor or health care professional as soon as possible: -allergic reactions like skin rash, itching or hives, swelling of the face, lips, or tongue -black or tarry stools -bone, muscle or joint pain -changes in vision -chest pain -heartburn or stomach pain -jaw pain, especially after dental work -pain or trouble when swallowing -redness, blistering, peeling or loosening of the skin, including inside the mouth Side effects that usually do not require medical attention (Report these to your doctor or health care professional if they continue or are bothersome.): -changes in taste -diarrhea or constipation -eye pain or itching -headache -nausea or vomiting -stomach gas or fullness This list may not describe all possible side effects. Call your doctor for medical advice about side effects. You may report side effects to FDA at 1-800-FDA-1088. Where should I keep my medicine? Keep out of the reach of children. Store between 20 and 25 degrees C (68  and 77 degrees F). Keep this medicine in the original container. Throw away any unused medicine after the expiration date. NOTE: This sheet is a summary. It may not cover all possible information. If you have questions about this medicine, talk to your doctor, pharmacist, or health care provider.  2014, [employer] Standard. (2011-10-29 09:07:01)

## 2014-05-30 NOTE — Progress Notes (Signed)
   The patient presented to the office today to discuss results of her recent bone density study. Also to discuss her blood tests.  Her blood test results were as follows: Results for Lisa Vega, Lisa Vega (MRN [id number]) as of 05/30/2014 12:29  Ref. Range 05/23/2014 13:52  Calcium Latest Range: 8.4-10.5 mg/dL 8.8  Vit D, 25-Hydroxy Latest Range: 30-89 ng/mL 27 (L)  PTH Latest Range: 14.0-72.0 pg/mL 103.8 (H)    Her bone density study had demonstrated that her labor was T score was at the left femoral neck with a value -2.2. Her FRAX analysis placed her in a high risk category for fracture as follows: 10 year fracture risk of the hip 20% (threshold is 3%) risk of any major osteoporotic fractures 37% over the course of the next 10 years (threshold 20%).  Patient has several risk factors for fracture: 1. Postmenopausal 2. Vitamin D deficiency 3. 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 Past history of smoking history of COPD 5. Rheumatoid arthritis 6. Chronic steroid usage 7. History of non-small cell cancer of the lung  With the above mentioned risks discussed with the patient and findings on bone density study she will be a candidate to be placed on antiresorptive agent. The risks benefits and pros and cons were discussed include spontaneous subtrochanteric fracture of the hip and also the risk of osteonecrosis of the jaw. Patient fully understands and accepts. She will be started on alendronate 70 mg one by mouth q. weekly. Thorough vitamin D deficiency she will be started on vitamin D 50,000 units q. weekly for 12 weeks and she will return back to the office for blood tests to check her calcium, vitamin D and PTH level. She will then go on vitamin D 3 2000 units daily thereafter. We will repeat a bone density study in one year to monitor response to treatment after starting her on alendronate. Literature and information on all the above was given to the patient.

## 2014-06-05 DIAGNOSIS — L723 Sebaceous cyst: Secondary | ICD-10-CM | POA: Diagnosis not present

## 2014-06-05 DIAGNOSIS — L719 Rosacea, unspecified: Secondary | ICD-10-CM | POA: Diagnosis not present

## 2014-06-25 ENCOUNTER — Telehealth (HOSPITAL_COMMUNITY): Payer: Self-pay | Admitting: *Deleted

## 2014-06-25 ENCOUNTER — Ambulatory Visit (HOSPITAL_COMMUNITY): Payer: Medicare Other

## 2014-06-28 DIAGNOSIS — H8110 Benign paroxysmal vertigo, unspecified ear: Secondary | ICD-10-CM | POA: Diagnosis not present

## 2014-06-28 DIAGNOSIS — E785 Hyperlipidemia, unspecified: Secondary | ICD-10-CM | POA: Diagnosis not present

## 2014-06-28 DIAGNOSIS — F3289 Other specified depressive episodes: Secondary | ICD-10-CM | POA: Diagnosis not present

## 2014-06-28 DIAGNOSIS — E039 Hypothyroidism, unspecified: Secondary | ICD-10-CM | POA: Diagnosis not present

## 2014-06-28 DIAGNOSIS — F329 Major depressive disorder, single episode, unspecified: Secondary | ICD-10-CM | POA: Diagnosis not present

## 2014-06-28 DIAGNOSIS — G250 Essential tremor: Secondary | ICD-10-CM | POA: Diagnosis not present

## 2014-07-02 ENCOUNTER — Telehealth (HOSPITAL_COMMUNITY): Payer: Self-pay | Admitting: *Deleted

## 2014-07-23 DIAGNOSIS — H18519 Endothelial corneal dystrophy, unspecified eye: Secondary | ICD-10-CM | POA: Diagnosis not present

## 2014-07-24 ENCOUNTER — Ambulatory Visit (INDEPENDENT_AMBULATORY_CARE_PROVIDER_SITE_OTHER): Payer: Medicare Other | Admitting: Gynecology

## 2014-07-24 ENCOUNTER — Encounter: Payer: Self-pay | Admitting: Gynecology

## 2014-07-24 ENCOUNTER — Encounter: Payer: Self-pay | Admitting: Pulmonary Disease

## 2014-07-24 VITALS — BP 124/80

## 2014-07-24 DIAGNOSIS — N9489 Other specified conditions associated with female genital organs and menstrual cycle: Secondary | ICD-10-CM | POA: Diagnosis not present

## 2014-07-24 DIAGNOSIS — R82998 Other abnormal findings in urine: Secondary | ICD-10-CM | POA: Diagnosis not present

## 2014-07-24 DIAGNOSIS — Z4689 Encounter for fitting and adjustment of other specified devices: Secondary | ICD-10-CM

## 2014-07-24 DIAGNOSIS — R102 Pelvic and perineal pain: Secondary | ICD-10-CM

## 2014-07-24 LAB — URINALYSIS W MICROSCOPIC + REFLEX CULTURE
Bilirubin Urine: NEGATIVE
Casts: NONE SEEN
Crystals: NONE SEEN
Glucose, UA: NEGATIVE mg/dL
Ketones, ur: NEGATIVE mg/dL
Nitrite: NEGATIVE
Protein, ur: NEGATIVE mg/dL
Specific Gravity, Urine: 1.02 (ref 1.005–1.030)
Urobilinogen, UA: 0.2 mg/dL (ref 0.0–1.0)
pH: 5.5 (ref 5.0–8.0)

## 2014-07-24 NOTE — Progress Notes (Signed)
   Patient is a 80 year old who presented to the office today for maintenance on her pessary. Patient with history of uterine prolapse along with cystocele and rectocele and history of urinary incontinence. Patient with multiple comorbidities not surgical candidate see past medical history list. She has done well with a ring pessary. She was having some slight frequency of urination but no dysuria or back pain or fever or chills.  Bartholin, Urethra, Skene Glands: Within normal limits  Vagina: No gross lesions or discharge, atrophic changes, second-degree cystocele and second-degree rectocele and first degree uterine descensus  Cervix: No gross lesions or discharge  Uterus axial, normal size, shape and consistency, non-tender and mobile  Adnexa Without masses or tenderness   The ring pessary was cleaned and replaced back into the vagina. Patient will continue to apply to vaginal estrogen twice a week. She will return back in 3 months for pessary maintenance as well.  Urinalysis today: WBC 7-to 10 Bacteria few RBCs 0-2  Assessment/plan: 80 y.o. female with multiple comorbidities and pelvic organ prolapse doing well with ring pessary. Patient will continue to apply vaginal estrogen twice a week and return to the office every 3 months for pessary maintenance. We will send her urine for culture.

## 2014-07-25 LAB — URINE CULTURE
Colony Count: NO GROWTH
Organism ID, Bacteria: NO GROWTH

## 2014-07-27 ENCOUNTER — Encounter (HOSPITAL_COMMUNITY): Payer: Self-pay

## 2014-07-27 ENCOUNTER — Encounter (HOSPITAL_COMMUNITY)
Admission: RE | Admit: 2014-07-27 | Discharge: 2014-07-27 | Disposition: A | Discharge status: Home / Self Care | Payer: Medicare Other | Source: Ambulatory Visit | Attending: Pulmonary Disease | Admitting: Pulmonary Disease

## 2014-07-27 VITALS — BP 110/58 | HR 63 | Resp 18 | Ht 63.0 in | Wt 163.8 lb

## 2014-07-27 DIAGNOSIS — J4489 Other specified chronic obstructive pulmonary disease: Secondary | ICD-10-CM | POA: Insufficient documentation

## 2014-07-27 DIAGNOSIS — J438 Other emphysema: Secondary | ICD-10-CM

## 2014-07-27 DIAGNOSIS — C3490 Malignant neoplasm of unspecified part of unspecified bronchus or lung: Secondary | ICD-10-CM | POA: Diagnosis not present

## 2014-07-27 DIAGNOSIS — J449 Chronic obstructive pulmonary disease, unspecified: Secondary | ICD-10-CM | POA: Diagnosis not present

## 2014-07-27 DIAGNOSIS — R0602 Shortness of breath: Secondary | ICD-10-CM | POA: Diagnosis not present

## 2014-07-27 NOTE — Progress Notes (Signed)
Lisa Vega 80 y.o. female Pulmonary Rehab Orientation Note Patient arrived today in [facility] for orientation to Pulmonary Rehab. She ambulated without difficulty from [employer]. She does not carry portable oxygen. Per pt, she uses oxygen never. Color good, skin warm and dry. Patient is oriented to time and place. Patient's medical history and medications reviewed. Heart rate is normal but irregular, breath sounds clear to auscultation, no wheezes, rales, or rhonchi. Grip strength equal, strong. Distal pulses palpable, no edema noted. Patient reports she does take medications as prescribed. Patient states she follows a Regular. The patient reports no specific efforts to gain or lose weight. Patient is interested in loosing weight and increasing her physical activity. Her BMI is 28.5 and she rarely exercises. Patient's weight will be monitored closely. Demonstration and practice of PLB using pulse oximeter. Patient able to return demonstration satisfactorily. Safety and hand hygiene in the exercise area reviewed with patient. Patient voices understanding of the information reviewed. Department expectations discussed with patient and achievable goals were set. The patient shows enthusiasm about attending the program and we look forward to working with this nice lady. The patient is scheduled for a 6 min walk test on Tuesday 8/4 at 3:15  and to begin exercise on Thursday 8/6 at 1:30.   45 minutes was spent on a variety of activities such as assessment of the patient, obtaining baseline data including height, weight, BMI, and grip strength, verifying medical history, allergies, and current medications, and teaching patient strategies for performing tasks with less respiratory effort with emphasis on pursed lip breathing.

## 2014-07-31 ENCOUNTER — Ambulatory Visit (HOSPITAL_COMMUNITY): Payer: Medicare Other

## 2014-07-31 DIAGNOSIS — Z79899 Other long term (current) drug therapy: Secondary | ICD-10-CM | POA: Diagnosis not present

## 2014-07-31 DIAGNOSIS — M2550 Pain in unspecified joint: Secondary | ICD-10-CM | POA: Diagnosis not present

## 2014-07-31 DIAGNOSIS — R5383 Other fatigue: Secondary | ICD-10-CM | POA: Diagnosis not present

## 2014-07-31 DIAGNOSIS — M069 Rheumatoid arthritis, unspecified: Secondary | ICD-10-CM | POA: Diagnosis not present

## 2014-07-31 DIAGNOSIS — R5381 Other malaise: Secondary | ICD-10-CM | POA: Diagnosis not present

## 2014-08-02 ENCOUNTER — Encounter (HOSPITAL_COMMUNITY): Payer: Medicare Other

## 2014-08-07 ENCOUNTER — Encounter (HOSPITAL_COMMUNITY): Payer: Medicare Other

## 2014-08-07 ENCOUNTER — Ambulatory Visit (HOSPITAL_COMMUNITY): Payer: Medicare Other

## 2014-08-09 ENCOUNTER — Inpatient Hospital Stay (HOSPITAL_COMMUNITY)
Admission: RE | Admit: 2014-08-09 | Discharge: 2014-08-09 | Disposition: A | Discharge status: Home / Self Care | Payer: Medicare Other | Source: Ambulatory Visit

## 2014-08-09 ENCOUNTER — Encounter (HOSPITAL_COMMUNITY): Payer: Medicare Other

## 2014-08-09 DIAGNOSIS — Z5189 Encounter for other specified aftercare: Secondary | ICD-10-CM | POA: Diagnosis present

## 2014-08-09 DIAGNOSIS — J438 Other emphysema: Secondary | ICD-10-CM | POA: Insufficient documentation

## 2014-08-09 NOTE — Progress Notes (Signed)
Lisa Vega completed a Six-Minute Walk Test on 08/09/14 . Lisa Vega walked 1,000 feet with 0 breaks.  The patient's lowest oxygen saturation was 90 , highest heart rate was 116 , and highest blood pressure was 140/62. The patient was on room air. Lisa Vega stated that knee pain hindered their walk test.

## 2014-08-14 ENCOUNTER — Encounter (HOSPITAL_COMMUNITY)
Admission: RE | Admit: 2014-08-14 | Discharge: 2014-08-14 | Disposition: A | Discharge status: Home / Self Care | Payer: Medicare Other | Source: Ambulatory Visit | Attending: Pulmonary Disease | Admitting: Pulmonary Disease

## 2014-08-14 DIAGNOSIS — C3490 Malignant neoplasm of unspecified part of unspecified bronchus or lung: Secondary | ICD-10-CM | POA: Insufficient documentation

## 2014-08-14 DIAGNOSIS — J4489 Other specified chronic obstructive pulmonary disease: Secondary | ICD-10-CM | POA: Insufficient documentation

## 2014-08-14 DIAGNOSIS — R0602 Shortness of breath: Secondary | ICD-10-CM | POA: Diagnosis not present

## 2014-08-14 DIAGNOSIS — J449 Chronic obstructive pulmonary disease, unspecified: Secondary | ICD-10-CM | POA: Insufficient documentation

## 2014-08-14 NOTE — Progress Notes (Signed)
Today, Lisa Vega exercised at [employer]. [facility]. Service time was from 1330 to 1500.  The patient exercised for more than 31 minutes performing aerobic, strengthening, and stretching exercises. Oxygen saturation, heart rate, blood pressure, rate of perceived exertion, and shortness of breath were all monitored before, during, and after exercise. Gwenlyn presented with no problems at today's exercise session.   There was a workload change during today's exercise session.  Pre-exercise vitals:   Weight kg: 74.3   Liters of O2: ra   SpO2: 95   HR: 78   BP: 128/70   CBG: na  Exercise vitals:   Highest heartrate:  82   Lowest oxygen saturation: 95   Highest blood pressure: 122/60   Liters of 02: ra  Post-exercise vitals:   SpO2: 96   HR: 86   BP: 108/56   Liters of O2: ra   CBG: na  Dr. Brand Males, Medical Director Dr. Maryland Pink is immediately available during today's Pulmonary Rehab session for Lisa Vega on 08/14/2014 at 1330 class time.

## 2014-08-16 ENCOUNTER — Encounter (HOSPITAL_COMMUNITY)
Admission: RE | Admit: 2014-08-16 | Discharge: 2014-08-16 | Disposition: A | Discharge status: Home / Self Care | Payer: Medicare Other | Source: Ambulatory Visit | Attending: Pulmonary Disease | Admitting: Pulmonary Disease

## 2014-08-16 DIAGNOSIS — R0602 Shortness of breath: Secondary | ICD-10-CM | POA: Diagnosis not present

## 2014-08-16 NOTE — Progress Notes (Signed)
Today, [name] exercised at [employer]. [facility]. Service time was from 1330 to 1530.  The patient exercised for more than 31 minutes performing aerobic, strengthening, and stretching exercises. Oxygen saturation, heart rate, blood pressure, rate of perceived exertion, and shortness of breath were all monitored before, during, and after exercise. Lisa Vega presented with no problems at today's exercise session.   There was no workload change during today's exercise session.  Pre-exercise vitals:   Weight kg: 73.5   Liters of O2: RA   SpO2: 94   HR: 69   BP: 124/60   CBG: NA  Exercise vitals:   Highest heartrate:  72   Lowest oxygen saturation: 94   Highest blood pressure: 130/70   Liters of 02: RA  Post-exercise vitals:   SpO2: 95   HR: 71   BP: 130/70   Liters of O2: RA   CBG: NA Dr. Brand Males, Medical Director Dr. Algis Liming is immediately available during today's Pulmonary Rehab session for Lisa Vega on 08/16/2014 at 1330 class time.

## 2014-08-21 ENCOUNTER — Encounter (HOSPITAL_COMMUNITY): Payer: Medicare Other

## 2014-08-23 ENCOUNTER — Encounter (HOSPITAL_COMMUNITY): Admission: RE | Admit: 2014-08-23 | Payer: Medicare Other | Source: Ambulatory Visit

## 2014-08-23 ENCOUNTER — Telehealth (HOSPITAL_COMMUNITY): Payer: Self-pay | Admitting: *Deleted

## 2014-08-24 ENCOUNTER — Ambulatory Visit (INDEPENDENT_AMBULATORY_CARE_PROVIDER_SITE_OTHER): Payer: Medicare Other | Admitting: Women's Health

## 2014-08-24 ENCOUNTER — Encounter: Payer: Self-pay | Admitting: Women's Health

## 2014-08-24 DIAGNOSIS — R300 Dysuria: Secondary | ICD-10-CM

## 2014-08-24 DIAGNOSIS — N3000 Acute cystitis without hematuria: Secondary | ICD-10-CM

## 2014-08-24 LAB — URINALYSIS W MICROSCOPIC + REFLEX CULTURE
Bilirubin Urine: NEGATIVE
Casts: NONE SEEN
Crystals: NONE SEEN
Glucose, UA: NEGATIVE mg/dL
Ketones, ur: NEGATIVE mg/dL
Nitrite: POSITIVE — AB
Specific Gravity, Urine: <1.005 — ABNORMAL LOW (ref 1.005–1.030)
Urobilinogen, UA: 0.2 mg/dL (ref 0.0–1.0)
pH: 5.5 (ref 5.0–8.0)

## 2014-08-24 MED ORDER — CIPROFLOXACIN HCL 250 MG PO TABS: 250.0000 mg | ORAL_TABLET | Freq: Two times a day (BID) | ORAL | Status: DC

## 2014-08-24 NOTE — Patient Instructions (Signed)

## 2014-08-24 NOTE — Progress Notes (Signed)
Patient ID: Lisa Vega, female   DOB: [birth year], 80 y.o.   MRN: [id number] Presents with complaint of urinary frequency, urgency, pressure was slight burning upon urination. States treated self with Diflucan yesterday with no relief. Has a pessary for cystocele/rectocele with good relief of incontinence, states feels is falling out. Denies fever, abdominal pain. Last UTI 11/2013.  Exam: Appears well. External genitalia slightly erythematous, ring pessary with support 2 3/4 removed, washed, speculum exam vaginal walls without erythema or discharge, pessary reinserted tolerated well. UA: Positive nitrites, large leukocytes, TNTC wbc's, many bacteria.  UTI  Plan: Cipro 250 twice daily for 3 days, instructed to call if no relief of symptoms. Discussed pessary well supported, not falling out. Will watch at this time. Urine culture pending.

## 2014-08-26 LAB — URINE CULTURE: Colony Count: >=100000

## 2014-08-28 ENCOUNTER — Encounter (HOSPITAL_COMMUNITY)
Admission: RE | Admit: 2014-08-28 | Discharge: 2014-08-28 | Disposition: A | Discharge status: Home / Self Care | Payer: Medicare Other | Source: Ambulatory Visit | Attending: Pulmonary Disease | Admitting: Pulmonary Disease

## 2014-08-28 DIAGNOSIS — R0602 Shortness of breath: Secondary | ICD-10-CM | POA: Diagnosis not present

## 2014-08-28 DIAGNOSIS — J4489 Other specified chronic obstructive pulmonary disease: Secondary | ICD-10-CM | POA: Insufficient documentation

## 2014-08-28 DIAGNOSIS — J449 Chronic obstructive pulmonary disease, unspecified: Secondary | ICD-10-CM | POA: Insufficient documentation

## 2014-08-28 DIAGNOSIS — C3490 Malignant neoplasm of unspecified part of unspecified bronchus or lung: Secondary | ICD-10-CM | POA: Insufficient documentation

## 2014-08-29 DIAGNOSIS — R1011 Right upper quadrant pain: Secondary | ICD-10-CM | POA: Diagnosis not present

## 2014-08-29 DIAGNOSIS — K5901 Slow transit constipation: Secondary | ICD-10-CM | POA: Diagnosis not present

## 2014-08-29 DIAGNOSIS — R1310 Dysphagia, unspecified: Secondary | ICD-10-CM | POA: Diagnosis not present

## 2014-08-29 DIAGNOSIS — K219 Gastro-esophageal reflux disease without esophagitis: Secondary | ICD-10-CM | POA: Diagnosis not present

## 2014-08-29 DIAGNOSIS — Z86010 Personal history of colonic polyps: Secondary | ICD-10-CM | POA: Diagnosis not present

## 2014-08-30 ENCOUNTER — Encounter (HOSPITAL_COMMUNITY)
Admission: RE | Admit: 2014-08-30 | Discharge: 2014-08-30 | Disposition: A | Discharge status: Home / Self Care | Payer: Medicare Other | Source: Ambulatory Visit | Attending: Pulmonary Disease | Admitting: Pulmonary Disease

## 2014-08-30 DIAGNOSIS — R0602 Shortness of breath: Secondary | ICD-10-CM | POA: Diagnosis not present

## 2014-08-30 NOTE — Progress Notes (Signed)
Today, [name] exercised at [employer]. [facility]. Service time was from 1330 to 1530.  The patient exercised for more than 31 minutes performing aerobic, strengthening, and stretching exercises. Oxygen saturation, heart rate, blood pressure, rate of perceived exertion, and shortness of breath were all monitored before, during, and after exercise. Lisa Vega presented with no problems at today's exercise session. Patient attended the oxygen safety class today.  There was no workload change during today's exercise session.  Pre-exercise vitals:   Weight kg: 73.8   Liters of O2: RA   SpO2: 95   HR: 55   BP: 104/60   CBG: NA  Exercise vitals:   Highest heartrate:  78   Lowest oxygen saturation: 90   Highest blood pressure: 118/86   Liters of 02: RA  Post-exercise vitals:   SpO2: 98   HR: 60   BP: 116/78   Liters of O2: RA   CBG: NA Dr. Brand Males, Medical Director Dr. Wendee Beavers is immediately available during today's Pulmonary Rehab session for Lisa Vega on 08/30/2014 at 1330 class time.

## 2014-09-04 ENCOUNTER — Encounter (HOSPITAL_COMMUNITY): Payer: Medicare Other

## 2014-09-06 ENCOUNTER — Encounter (HOSPITAL_COMMUNITY)
Admission: RE | Admit: 2014-09-06 | Discharge: 2014-09-06 | Disposition: A | Discharge status: Home / Self Care | Payer: Medicare Other | Source: Ambulatory Visit | Attending: Pulmonary Disease | Admitting: Pulmonary Disease

## 2014-09-06 DIAGNOSIS — R0602 Shortness of breath: Secondary | ICD-10-CM | POA: Diagnosis not present

## 2014-09-06 NOTE — Progress Notes (Signed)
I have reviewed a Home Exercise Prescription with Lisa Vega . Lisa Vega is currently exercising at home.  The patient was advised to walk 3-5 days a week for 20-25 minutes.  Zakiyah and I discussed how to progress their exercise prescription.  The patient stated that their goals were to be able to maintain her independence.  The patient stated that they understand the exercise prescription.  We reviewed exercise guidelines, target heart rate during exercise, oxygen use, weather, home pulse oximeter, endpoints for exercise, and goals.  Patient is encouraged to come to me with any questions. I will continue to follow up with the patient to assist them with progression and safety.

## 2014-09-06 NOTE — Progress Notes (Signed)
Today, Lisa Vega exercised at [employer]. Lisa Vega. Service time was from 1:30 to 3:30.  The patient exercised for more than 31 minutes performing aerobic, strengthening, and stretching exercises. Oxygen saturation, heart rate, blood pressure, rate of perceived exertion, and shortness of breath were all monitored before, during, and after exercise. Lisa Vega presented with no problems at today's exercise session. The patient attended education class with Lisa Vega on Nutrition.  There was no workload change during today's exercise session.  Pre-exercise vitals:   Weight kg: 74.3   Liters of O2: a   SpO2: 96   HR: 72   BP: 102/62   CBG: na  Exercise vitals:   Highest heartrate:  74   Lowest oxygen saturation: 95   Highest blood pressure: 130/60   Liters of 02: ra  Post-exercise vitals:   SpO2: 97   HR: 54   BP: 116/64   Liters of O2: ra   CBG: na  Dr. Brand Males, Medical Director Dr. Coralyn Pear is immediately available during today's Pulmonary [facility]ega session for Lisa Vega on 09/06/14 at 1:30pm class time.

## 2014-09-11 ENCOUNTER — Encounter (HOSPITAL_COMMUNITY)
Admission: RE | Admit: 2014-09-11 | Discharge: 2014-09-11 | Disposition: A | Discharge status: Home / Self Care | Payer: Medicare Other | Source: Ambulatory Visit | Attending: Pulmonary Disease | Admitting: Pulmonary Disease

## 2014-09-11 DIAGNOSIS — R0602 Shortness of breath: Secondary | ICD-10-CM | POA: Diagnosis not present

## 2014-09-11 NOTE — Progress Notes (Signed)
Today, Lisa Vega exercised at [facility]. Service time was from 1330 to 1500.  The patient exercised for more than 31 minutes performing aerobic, strengthening, and stretching exercises. Oxygen saturation, heart rate, blood pressure, rate of perceived exertion, and shortness of breath were all monitored before, during, and after exercise. Lisa Vega presented with no problems at today's exercise session.   There was no workload change during today's exercise session.  Pre-exercise vitals:   Weight kg: 74.5   Liters of O2: ra   SpO2: 94   HR: 63   BP: 100/58   CBG: na  Exercise vitals:   Highest heartrate:  79   Lowest oxygen saturation: 94   Highest blood pressure: 122/68   Liters of 02: ra  Post-exercise vitals:   SpO2: 95   HR: 56   BP: 104/60   Liters of O2: ra   CBG: na  Dr. Brand Males, Medical Director Dr. Coralyn Pear is immediately available during today's Pulmonary Rehab session for Lisa Vega on 09/11/2014 at 1330 class time.

## 2014-09-11 NOTE — Progress Notes (Signed)
Lisa Vega 80 y.o. female Nutrition Note Spoke with pt. Pt is overweight. Pt reports she wants to lose wt. Pt is not actively trying to lose wt "other than my exercise at rehab and at home." There are some ways the pt can make her eating habits healthier. Pt's Rate Your Plate results reviewed with pt. Pt encouraged to increase her fish intake and fruit and vegetable intake. Recommended pt consider switching from ground chuck to ground sirloin and from butter to tub margarine or olive oil. Pt does not avoid salty food; uses frozen dinners frequently.  Pt states she rarely adds salt to food.  The role of sodium in lung disease reviewed with pt. Per discussion, pt is having difficulty chewing due to ill-fitting dentures. Pt has a dental appointment scheduled to have dentures fixed. Pt c/o constipation, which is relieved by taking stool softeners daily and Miralax "when needed." Pt encouraged to drink plenty of fluids to help prevent constipation. Pt expressed understanding of the information reviewed.  Nutrition Diagnosis   Food-and nutrition-related knowledge deficit related to lack of exposure to information as related to diagnosis of pulmonary disease    Nutrition Rx/Est. Daily Nutrition Needs for: ? wt loss 1200-1300 Kcal  90-110 gm protein   1500 mg or less sodium      Nutrition Intervention   Pt's individual nutrition plan and goals reviewed with pt.   Benefits of adopting healthy eating habits discussed when pt's Rate Your Plate reviewed.   Pt to attend the Nutrition and Lung Disease class - met; 09/06/14   Continual client-centered nutrition education by RD, as part of interdisciplinary care. Goal(s) 1. Pt to identify and limit food sources of sodium. 2. Describe the benefit of including fruits, vegetables, whole grains, and low-fat dairy products in a healthy meal plan. Monitor and Evaluate progress toward nutrition goal with team.   Derek Mound, M.Ed, RD, LDN, CDE 09/11/2014 2:22 PM

## 2014-09-12 ENCOUNTER — Ambulatory Visit (INDEPENDENT_AMBULATORY_CARE_PROVIDER_SITE_OTHER): Payer: Medicare Other | Admitting: Pulmonary Disease

## 2014-09-12 ENCOUNTER — Encounter: Payer: Self-pay | Admitting: Pulmonary Disease

## 2014-09-12 VITALS — BP 128/50 | HR 78 | Temp 98.0°F | Ht 64.75 in | Wt 163.6 lb

## 2014-09-12 DIAGNOSIS — J439 Emphysema, unspecified: Secondary | ICD-10-CM

## 2014-09-12 DIAGNOSIS — J438 Other emphysema: Secondary | ICD-10-CM | POA: Diagnosis not present

## 2014-09-12 DIAGNOSIS — Z23 Encounter for immunization: Secondary | ICD-10-CM

## 2014-09-12 NOTE — Patient Instructions (Signed)
Will give you the flu shot today. Continue on symbicort, but will add a spacer to see if helps with voice changes.  Will try spiriva respimat (different form).  Take 2 inhalations each am everyday.  If this works for you, let us know and we can send in prescription.  If this does not work, can try something else.  Continue with pulmonary rehab. followup with me again in 57mos.

## 2014-09-12 NOTE — Assessment & Plan Note (Signed)
The patient has known moderate COPD, and is still not satisfied with her baseline exertional tolerance. I think a lot of this his weight and conditioning, and it least she is participating in pulmonary rehabilitation. I would like to try Spiriva again, but this time in the form of Respimat, which is a smaller particles size with better lung deposition and very little upper airway irritation. I would also like to try her on a spacer with her Symbicort to help with dysphonia. I have stressed to her the importance of rinsing her mouth well.

## 2014-09-12 NOTE — Progress Notes (Signed)
   Subjective:    Patient ID: Lisa Vega, female    DOB: 17-May-1932, 80 y.o.   MRN: [id number]  HPI Patient comes in today for followup of her known moderate COPD. She has been staying on Symbicort and participating in pulmonary rehabilitation. We have tried her on Spiriva, and she had issues with throat discomfort and dysphonia. She is even having this issue with her Symbicort. She feels that her breathing is near her usual baseline, but has both good and bad days. She has had no significant acute exacerbation since last visit. Denies any significant purulence or congestion.   Review of Systems  Constitutional: Negative for fever and unexpected weight change.  HENT: Positive for congestion and postnasal drip. Negative for dental problem, ear pain, nosebleeds, rhinorrhea, sinus pressure, sneezing, sore throat and trouble swallowing.   Eyes: Negative for redness and itching.  Respiratory: Positive for cough, chest tightness, shortness of breath and wheezing.   Cardiovascular: Negative for palpitations and leg swelling.  Gastrointestinal: Negative for nausea and vomiting.  Genitourinary: Negative for dysuria.  Musculoskeletal: Negative for joint swelling.  Skin: Negative for rash.  Neurological: Negative for headaches.  Hematological: Does not bruise/bleed easily.  Psychiatric/Behavioral: Negative for dysphoric mood. The patient is not nervous/anxious.        Objective:   Physical Exam Overweight female in no acute distress Nose without purulence or discharge noted Neck without lymphadenopathy or thyromegaly Chest with mild decreased breath sounds, no wheezes or crackles Cardiac exam with regular rate and rhythm Lower extremities with mild edema, no cyanosis Alert and oriented, moves all 4 extremities.       Assessment & Plan:

## 2014-09-13 ENCOUNTER — Encounter (HOSPITAL_COMMUNITY)
Admission: RE | Admit: 2014-09-13 | Discharge: 2014-09-13 | Disposition: A | Discharge status: Home / Self Care | Payer: Medicare Other | Source: Ambulatory Visit | Attending: Pulmonary Disease | Admitting: Pulmonary Disease

## 2014-09-13 DIAGNOSIS — R0602 Shortness of breath: Secondary | ICD-10-CM | POA: Diagnosis not present

## 2014-09-13 NOTE — Progress Notes (Signed)
Today, Jackqueline exercised at [facility]. Service time was from 1330 to 1530.  The patient exercised for more than 31 minutes performing aerobic, strengthening, and stretching exercises. Oxygen saturation, heart rate, blood pressure, rate of perceived exertion, and shortness of breath were all monitored before, during, and after exercise. Patton presented with no problems at today's exercise session.   There was no workload change during today's exercise session.  Pre-exercise vitals:   Weight kg: 74.5   Liters of O2: ra   SpO2: 95   HR: 55   BP: 102/50   CBG: na  Exercise vitals:   Highest heartrate:  71   Lowest oxygen saturation: 97   Highest blood pressure: 122/62   Liters of 02: ra  Post-exercise vitals:   SpO2: 96   HR: 54   BP: 104/64   Liters of O2: ra   CBG: na  Dr. Brand Males, Medical Director [name] is immediately available during today's Pulmonary Rehab session for [name] on 09/13/2014 at 1330 class time.

## 2014-09-18 ENCOUNTER — Encounter (HOSPITAL_COMMUNITY)
Admission: RE | Admit: 2014-09-18 | Discharge: 2014-09-18 | Disposition: A | Discharge status: Home / Self Care | Payer: Medicare Other | Source: Ambulatory Visit | Attending: Pulmonary Disease | Admitting: Pulmonary Disease

## 2014-09-18 DIAGNOSIS — R0602 Shortness of breath: Secondary | ICD-10-CM | POA: Diagnosis not present

## 2014-09-18 NOTE — Progress Notes (Signed)
Today, Lisa Vega exercised at [facility]. Service time was from 1330 to 1500.  The patient exercised for more than 31 minutes performing aerobic, strengthening, and stretching exercises. Oxygen saturation, heart rate, blood pressure, rate of perceived exertion, and shortness of breath were all monitored before, during, and after exercise. Lisa Vega presented with no problems at today's exercise session.   There was a workload change during today's exercise session.  Pre-exercise vitals:   Weight kg: 74.9   Liters of O2: ra   SpO2: 95   HR: 67   BP: 100/68   CBG: na  Exercise vitals:   Highest heartrate:  74   Lowest oxygen saturation: 94   Highest blood pressure: 134/70   Liters of 02: ra  Post-exercise vitals:   SpO2: 98   HR: 67   BP: 104/60   Liters of O2: ra   CBG: na  Dr. Brand Males, Medical Director [name] is immediately available during today's Pulmonary Rehab session for Lisa Vega on 09/18/2014 at 1330 class time.

## 2014-09-19 ENCOUNTER — Ambulatory Visit: Payer: Medicare Other | Admitting: Pulmonary Disease

## 2014-09-20 ENCOUNTER — Encounter (HOSPITAL_COMMUNITY)
Admission: RE | Admit: 2014-09-20 | Discharge: 2014-09-20 | Disposition: A | Discharge status: Home / Self Care | Payer: Medicare Other | Source: Ambulatory Visit | Attending: Pulmonary Disease | Admitting: Pulmonary Disease

## 2014-09-20 DIAGNOSIS — R0602 Shortness of breath: Secondary | ICD-10-CM | POA: Diagnosis not present

## 2014-09-20 NOTE — Progress Notes (Signed)
Today, Geanine exercised at [facility]. Service time was from 1:30pm to 3:15pm.  The patient exercised for more than 31 minutes performing aerobic, strengthening, and stretching exercises. Oxygen saturation, heart rate, blood pressure, rate of perceived exertion, and shortness of breath were all monitored before, during, and after exercise. Sindia presented with no problems at today's exercise session.   There was an increase in workload change during today's exercise session.  Pre-exercise vitals:   Weight kg: 75.0   Liters of O2: ra   SpO2: 95   HR: 64   BP: 102/50   CBG: na  Exercise vitals:   Highest heartrate:  70   Lowest oxygen saturation: 96   Highest blood pressure: 114/70   Liters of 02: ra  Post-exercise vitals:   SpO2: 97   HR: 51   BP: 120/64   Liters of O2: a   CBG: na  Dr. Brand Males, Medical Director Dr. Dyann Kief is immediately available during today's Pulmonary Rehab session for [name] on 09/20/14 at 1:30pm class time.

## 2014-09-21 DIAGNOSIS — H109 Unspecified conjunctivitis: Secondary | ICD-10-CM | POA: Diagnosis not present

## 2014-09-21 DIAGNOSIS — H18519 Endothelial corneal dystrophy, unspecified eye: Secondary | ICD-10-CM | POA: Diagnosis not present

## 2014-09-23 ENCOUNTER — Encounter: Payer: Self-pay | Admitting: *Deleted

## 2014-09-25 ENCOUNTER — Encounter (HOSPITAL_COMMUNITY): Payer: Medicare Other

## 2014-09-27 ENCOUNTER — Encounter (HOSPITAL_COMMUNITY): Payer: Medicare Other

## 2014-09-27 ENCOUNTER — Telehealth (HOSPITAL_COMMUNITY): Payer: Self-pay | Admitting: Family Medicine

## 2014-10-01 DIAGNOSIS — M25562 Pain in left knee: Secondary | ICD-10-CM | POA: Diagnosis not present

## 2014-10-01 DIAGNOSIS — M1712 Unilateral primary osteoarthritis, left knee: Secondary | ICD-10-CM | POA: Diagnosis not present

## 2014-10-01 DIAGNOSIS — M5090 Cervical disc disorder, unspecified, unspecified cervical region: Secondary | ICD-10-CM | POA: Diagnosis not present

## 2014-10-02 ENCOUNTER — Encounter (HOSPITAL_COMMUNITY)
Admission: RE | Admit: 2014-10-02 | Discharge: 2014-10-02 | Disposition: A | Discharge status: Home / Self Care | Payer: Medicare Other | Source: Ambulatory Visit | Attending: Pulmonary Disease | Admitting: Pulmonary Disease

## 2014-10-02 DIAGNOSIS — R0602 Shortness of breath: Secondary | ICD-10-CM | POA: Insufficient documentation

## 2014-10-02 DIAGNOSIS — J449 Chronic obstructive pulmonary disease, unspecified: Secondary | ICD-10-CM | POA: Diagnosis not present

## 2014-10-02 DIAGNOSIS — Z5189 Encounter for other specified aftercare: Secondary | ICD-10-CM | POA: Diagnosis not present

## 2014-10-02 DIAGNOSIS — C3490 Malignant neoplasm of unspecified part of unspecified bronchus or lung: Secondary | ICD-10-CM | POA: Insufficient documentation

## 2014-10-02 NOTE — Progress Notes (Signed)
Today, Lisa Vega exercised at [facility]. Service time was from 1330 to 1500.  The patient exercised for more than 31 minutes performing aerobic, strengthening, and stretching exercises. Oxygen saturation, heart rate, blood pressure, rate of perceived exertion, and shortness of breath were all monitored before, during, and after exercise. Benigna presented with no problems at today's exercise session.   There was no workload change during today's exercise session.  Pre-exercise vitals:   Weight kg: 75.2   Liters of O2: ra   SpO2: 97   HR: 68   BP: 114/60   CBG: na  Exercise vitals:   Highest heartrate:  80   Lowest oxygen saturation: 95   Highest blood pressure: 132/66   Liters of 02: ra  Post-exercise vitals:   SpO2: 98   HR: 66   BP: 118/62   Liters of O2: ra   CBG: na  Dr. Brand Males, Medical Director Dr. Aileen Fass is immediately available during today's Pulmonary Rehab session for Lisa Vega on 10/02/2014 at 1330 class time.

## 2014-10-04 ENCOUNTER — Encounter (HOSPITAL_COMMUNITY)
Admission: RE | Admit: 2014-10-04 | Discharge: 2014-10-04 | Disposition: A | Discharge status: Home / Self Care | Payer: Medicare Other | Source: Ambulatory Visit | Attending: Pulmonary Disease | Admitting: Pulmonary Disease

## 2014-10-04 DIAGNOSIS — Z5189 Encounter for other specified aftercare: Secondary | ICD-10-CM | POA: Diagnosis not present

## 2014-10-04 NOTE — Progress Notes (Signed)
Today, Oneka exercised at [facility]. Service time was from 1330 to 1515.  The patient exercised for more than 31 minutes performing aerobic, strengthening, and stretching exercises. Oxygen saturation, heart rate, blood pressure, rate of perceived exertion, and shortness of breath were all monitored before, during, and after exercise. [name] presented with no problems at today's exercise session. [name] also attended an education session on pulmonary medications.  There was no workload change during today's exercise session.  Pre-exercise vitals:   Weight kg: 74.7   Liters of O2: ra   SpO2: 96   HR: 67   BP: 110/60   CBG: na  Exercise vitals:   Highest heartrate:  63   Lowest oxygen saturation: 97   Highest blood pressure: 138/60   Liters of 02: ra  Post-exercise vitals:   SpO2: 99   HR: 58   BP: 112/70   Liters of O2: ra   CBG: na  Dr. Brand Males, Medical Director Dr. Dyann Kief is immediately available during today's Pulmonary Rehab session for [name] on 10/04/2014 at 1330 class time.

## 2014-10-09 ENCOUNTER — Encounter (HOSPITAL_COMMUNITY)
Admission: RE | Admit: 2014-10-09 | Discharge: 2014-10-09 | Disposition: A | Discharge status: Home / Self Care | Payer: Medicare Other | Source: Ambulatory Visit | Attending: Pulmonary Disease | Admitting: Pulmonary Disease

## 2014-10-09 DIAGNOSIS — Z5189 Encounter for other specified aftercare: Secondary | ICD-10-CM | POA: Diagnosis not present

## 2014-10-09 NOTE — Progress Notes (Signed)
Today, [name] exercised at [facility]. Service time was from 1330 to 1500.  The patient exercised for more than 31 minutes performing aerobic, strengthening, and stretching exercises. Oxygen saturation, heart rate, blood pressure, rate of perceived exertion, and shortness of breath were all monitored before, during, and after exercise. Lisa Vega presented with no problems at today's exercise session.   There was a workload change during today's exercise session.  Pre-exercise vitals:   Weight kg: 73.8   Liters of O2: ra   SpO2: 98   HR: 70   BP: 118/50   CBG: na  Exercise vitals:   Highest heartrate:  84   Lowest oxygen saturation: 91   Highest blood pressure:   120/56   Liters of 02: ra  Post-exercise vitals:   SpO2: 96   HR: 65   BP: 110/54   Liters of O2: ra   CBG: na  Dr. Brand Males, Medical Director Dr. Dyann Kief is immediately available during today's Pulmonary Rehab session for [name] on 10/09/2014 at 1330 class time.

## 2014-10-10 DIAGNOSIS — Z961 Presence of intraocular lens: Secondary | ICD-10-CM | POA: Diagnosis not present

## 2014-10-10 DIAGNOSIS — H1851 Endothelial corneal dystrophy: Secondary | ICD-10-CM | POA: Diagnosis not present

## 2014-10-10 DIAGNOSIS — H1032 Unspecified acute conjunctivitis, left eye: Secondary | ICD-10-CM | POA: Diagnosis not present

## 2014-10-11 ENCOUNTER — Encounter (HOSPITAL_COMMUNITY): Payer: Medicare Other

## 2014-10-11 ENCOUNTER — Telehealth (HOSPITAL_COMMUNITY): Payer: Self-pay | Admitting: Family Medicine

## 2014-10-16 ENCOUNTER — Encounter (HOSPITAL_COMMUNITY)
Admission: RE | Admit: 2014-10-16 | Discharge: 2014-10-16 | Disposition: A | Discharge status: Home / Self Care | Payer: Medicare Other | Source: Ambulatory Visit | Attending: Pulmonary Disease | Admitting: Pulmonary Disease

## 2014-10-16 DIAGNOSIS — Z5189 Encounter for other specified aftercare: Secondary | ICD-10-CM | POA: Diagnosis not present

## 2014-10-16 NOTE — Progress Notes (Signed)
Today, Sadonna exercised at [facility]. Service time was from 1330 to 1500.  The patient exercised for more than 31 minutes performing aerobic, strengthening, and stretching exercises. Oxygen saturation, heart rate, blood pressure, rate of perceived exertion, and shortness of breath were all monitored before, during, and after exercise. Jazmynn presented with no problems at today's exercise session.   There was two workload change during today's exercise session.  Pre-exercise vitals:   Weight kg: 74.8   Liters of O2: RA   SpO2: 94   HR: 78   BP: 98/48   CBG: NA  Exercise vitals:   Highest heartrate:  87   Lowest oxygen saturation: 93   Highest blood pressure: 124/50   Liters of 02: RA  Post-exercise vitals:   SpO2: 94   HR: 69   BP: 100/60   Liters of O2: RA   CBG: NA Dr. Brand Males, Medical Director Dr. Aileen Fass is immediately available during today's Pulmonary Rehab session for [name] on 10/16/2014 at 1330 class time.

## 2014-10-18 ENCOUNTER — Encounter (HOSPITAL_COMMUNITY)
Admission: RE | Admit: 2014-10-18 | Discharge: 2014-10-18 | Disposition: A | Discharge status: Home / Self Care | Payer: Medicare Other | Source: Ambulatory Visit | Attending: Pulmonary Disease | Admitting: Pulmonary Disease

## 2014-10-18 DIAGNOSIS — Z5189 Encounter for other specified aftercare: Secondary | ICD-10-CM | POA: Diagnosis not present

## 2014-10-18 NOTE — Progress Notes (Signed)
Today, Nhi exercised at [facility]. Service time was from 1330 to 1515.  The patient exercised for more than 31 minutes performing aerobic, strengthening, and stretching exercises. Oxygen saturation, heart rate, blood pressure, rate of perceived exertion, and shortness of breath were all monitored before, during, and after exercise. Sahira presented with no problems at today's exercise session. Patient attended the [employer] services class today.    There was no workload change during today's exercise session.  Pre-exercise vitals:   Weight kg: 74.4   Liters of O2: RA   SpO2: 96   HR: 73   BP: 100/48   CBG: NA  Exercise vitals:   Highest heartrate:  76   Lowest oxygen saturation: 94   Highest blood pressure: 124/58   Liters of 02: RA  Post-exercise vitals:   SpO2: 93   HR: 67   BP: 100/66   Liters of O2: RA   CBG: NA Dr. Brand Males, Medical Director Dr. Wynelle Cleveland is immediately available during today's Pulmonary Rehab session for [name] on 10/18/2014 at 1330 class time.

## 2014-10-23 ENCOUNTER — Encounter (HOSPITAL_COMMUNITY): Payer: Medicare Other

## 2014-10-24 ENCOUNTER — Ambulatory Visit: Payer: Medicare Other | Admitting: Gynecology

## 2014-10-25 ENCOUNTER — Encounter (HOSPITAL_COMMUNITY)
Admission: RE | Admit: 2014-10-25 | Discharge: 2014-10-25 | Disposition: A | Discharge status: Home / Self Care | Payer: Medicare Other | Source: Ambulatory Visit | Attending: Pulmonary Disease | Admitting: Pulmonary Disease

## 2014-10-25 DIAGNOSIS — Z5189 Encounter for other specified aftercare: Secondary | ICD-10-CM | POA: Diagnosis not present

## 2014-10-25 NOTE — Progress Notes (Signed)
Today, Shoshannah exercised at [facility]. Service time was from 1330 to 1530.  The patient exercised for more than 31 minutes performing aerobic, strengthening, and stretching exercises. Oxygen saturation, heart rate, blood pressure, rate of perceived exertion, and shortness of breath were all monitored before, during, and after exercise. Kura presented with no problems at today's exercise session. Uilani also attended an education session on [employer].  There was no workload change during today's exercise session.  Pre-exercise vitals:   Weight kg: 74.6   Liters of O2: ra   SpO2: 98   HR: 74   BP: 116/68   CBG: na  Exercise vitals:   Highest heartrate:  73   Lowest oxygen saturation: 95   Highest blood pressure: 122/70   Liters of 02: ra  Post-exercise vitals:   SpO2: 96   HR: 70   BP: 120/64   Liters of O2: ra   CBG: na  Dr. Brand Males, Medical Director Dr. Aileen Fass is immediately available during today's Pulmonary Rehab session for [name] on 10/25/2014 at 1330 class time.

## 2014-10-29 ENCOUNTER — Ambulatory Visit: Payer: Medicare Other | Admitting: Gynecology

## 2014-10-29 ENCOUNTER — Encounter: Payer: Self-pay | Admitting: *Deleted

## 2014-10-30 ENCOUNTER — Encounter (HOSPITAL_COMMUNITY)
Admission: RE | Admit: 2014-10-30 | Discharge: 2014-10-30 | Disposition: A | Discharge status: Home / Self Care | Payer: Medicare Other | Source: Ambulatory Visit | Attending: Pulmonary Disease | Admitting: Pulmonary Disease

## 2014-10-30 DIAGNOSIS — C3490 Malignant neoplasm of unspecified part of unspecified bronchus or lung: Secondary | ICD-10-CM | POA: Diagnosis not present

## 2014-10-30 DIAGNOSIS — J449 Chronic obstructive pulmonary disease, unspecified: Secondary | ICD-10-CM | POA: Insufficient documentation

## 2014-10-30 DIAGNOSIS — R0602 Shortness of breath: Secondary | ICD-10-CM | POA: Diagnosis not present

## 2014-10-30 DIAGNOSIS — Z5189 Encounter for other specified aftercare: Secondary | ICD-10-CM | POA: Diagnosis not present

## 2014-10-30 NOTE — Progress Notes (Signed)
Today, [name] exercised at [facility]. Service time was from 1330 to 1530.  The patient exercised for more than 31 minutes performing aerobic, strengthening, and stretching exercises. Oxygen saturation, heart rate, blood pressure, rate of perceived exertion, and shortness of breath were all monitored before, during, and after exercise. [name] presented with no problems at today's exercise session.   There was no workload change during today's exercise session.  Pre-exercise vitals: . Weight kg: 74.0 . Liters of O2: RA . SpO2: 96 . HR: 68 . BP: 118/60 . CBG: NA  Exercise vitals: . Highest heartrate:  80 . Lowest oxygen saturation: 92 . Highest blood pressure: 138/68 . Liters of 02: RA  Post-exercise vitals: . SpO2: 98 . HR: 65 . BP: 122/64 . Liters of O2: RA . CBG: NA Dr. Brand Males, Medical Director Dr. Aileen Fass is immediately available during today's Pulmonary Rehab session for Lisa Vega on 10/30/2014 at 1330 class time.  Marland Kitchen

## 2014-11-01 ENCOUNTER — Ambulatory Visit (INDEPENDENT_AMBULATORY_CARE_PROVIDER_SITE_OTHER): Payer: Medicare Other | Admitting: Gynecology

## 2014-11-01 ENCOUNTER — Encounter (HOSPITAL_COMMUNITY)
Admission: RE | Admit: 2014-11-01 | Discharge: 2014-11-01 | Disposition: A | Discharge status: Home / Self Care | Payer: Medicare Other | Source: Ambulatory Visit | Attending: Pulmonary Disease | Admitting: Pulmonary Disease

## 2014-11-01 ENCOUNTER — Encounter: Payer: Self-pay | Admitting: Gynecology

## 2014-11-01 VITALS — BP 136/70

## 2014-11-01 DIAGNOSIS — G8929 Other chronic pain: Secondary | ICD-10-CM | POA: Diagnosis not present

## 2014-11-01 DIAGNOSIS — Z4689 Encounter for fitting and adjustment of other specified devices: Secondary | ICD-10-CM

## 2014-11-01 DIAGNOSIS — E559 Vitamin D deficiency, unspecified: Secondary | ICD-10-CM | POA: Diagnosis not present

## 2014-11-01 DIAGNOSIS — R1013 Epigastric pain: Secondary | ICD-10-CM

## 2014-11-01 DIAGNOSIS — N3000 Acute cystitis without hematuria: Secondary | ICD-10-CM | POA: Diagnosis not present

## 2014-11-01 DIAGNOSIS — Z5189 Encounter for other specified aftercare: Secondary | ICD-10-CM | POA: Diagnosis not present

## 2014-11-01 DIAGNOSIS — N814 Uterovaginal prolapse, unspecified: Secondary | ICD-10-CM | POA: Diagnosis not present

## 2014-11-01 LAB — URINALYSIS W MICROSCOPIC + REFLEX CULTURE
Bilirubin Urine: NEGATIVE
Casts: NONE SEEN
Crystals: NONE SEEN
Glucose, UA: NEGATIVE mg/dL
Hgb urine dipstick: NEGATIVE
Ketones, ur: NEGATIVE mg/dL
Nitrite: NEGATIVE
Protein, ur: NEGATIVE mg/dL
RBC / HPF: NONE SEEN RBC/hpf (ref <3)
Specific Gravity, Urine: 1.01 (ref 1.005–1.030)
Urobilinogen, UA: 0.2 mg/dL (ref 0.0–1.0)
pH: 5.5 (ref 5.0–8.0)

## 2014-11-01 MED ORDER — PHENAZOPYRIDINE HCL 200 MG PO TABS: 200.0000 mg | ORAL_TABLET | Freq: Three times a day (TID) | ORAL | Status: DC | PRN

## 2014-11-01 MED ORDER — NITROFURANTOIN MONOHYD MACRO 100 MG PO CAPS: 100.0000 mg | ORAL_CAPSULE | Freq: Two times a day (BID) | ORAL | Status: DC

## 2014-11-01 NOTE — Progress Notes (Signed)
Today, Lisa Vega exercised at [facility]. Service time was from 1:30pm to 3:15pm.  The patient exercised for more than 31 minutes performing aerobic, strengthening, and stretching exercises. Oxygen saturation, heart rate, blood pressure, rate of perceived exertion, and shortness of breath were all monitored before, during, and after exercise. Lisa Vega presented with no problems at today's exercise session. Patient attended education class with Derek Mound on [employer].  There was no workload change during today's exercise session.  Pre-exercise vitals: . Weight kg: 75.6 . Liters of O2: ra . SpO2: 98 . HR: 76 . BP: 132/58 . CBG: na  Exercise vitals: . Highest heartrate:  75 . Lowest oxygen saturation: 93 . Highest blood pressure: 138/60 . Liters of 02: ra  Post-exercise vitals: . SpO2: 100 . HR: 66 . BP: 104/58 . Liters of O2: ra . CBG: na  Dr. Brand Males, Medical Director Dr. Wynelle Cleveland is immediately available during today's Pulmonary Rehab session for Lisa Vega on 11/01/14 at 1:30pm class time.

## 2014-11-01 NOTE — Patient Instructions (Signed)

## 2014-11-01 NOTE — Progress Notes (Signed)
   Patient is an 80 year old who presented to the office today for maintenance on her pessary. Patient with history of uterine prolapse along with cystocele and rectocele and history of urinary incontinence. Patient with multiple comorbidities not surgical candidate see past medical history list. She has done well with a ring pessary. Patient for the past few days has been complaining of dysuria and frequency. She also has complaining for the past several months of right upper quadrant pain which radiates to her flank. She denies any fever, chills, nausea, or vomiting.  Exam: Abdominal exam: Abdomen soft slightly tender midepigastric region but no rebound or guarding. Lower abdomen soft nontender no rebound or guarding Bartholin, Urethra, Skene Glands: Within normal limits  Vagina: No gross lesions or discharge, atrophic changes, second-degree cystocele and second-degree rectocele and first degree uterine descensus  Cervix: No gross lesions or discharge  Uterus axial, normal size, shape and consistency, non-tender and mobile  Adnexa Without masses or tenderness  The ring pessary was cleaned and replaced back into the vagina. Patient will continue to apply to vaginal estrogen twice a week. She will return back in 3 months for pessary maintenance as well.   Urinalysis: WBC: 11-20, bacteria: Many, also yeast was noted  Assessment/plan:  :80 year old female  multiple comorbidities and pelvic organ prolapse doing well with ring pessary. Patient will continue to apply vaginal estrogen twice a week and return to the office every 3 months for pessary maintenance. :for her urinary tract infection she will be prescribed Macrobid one by mouth twice a day for 7 days. For her bladder spasm she will be prescribed Pyridium 200 mg one by mouth 3 times a day for 3 days. And for yeast infection she'll be prescribed Diflucan 150 mg one by mouth. Patient with history vitamin D deficiency was placed on treatment  for 3 months and will have her vitamin D level checked today. She is scheduled for bone density study next year because her Frax analysis hyperplasia high-risk for fracture. She will continue on her Fosamax 70 mg every weekly.because of patient's upper abdominal pains on and off she will be sent for an upper abdominal ultrasound and is recommended she follow-up with her gastroenterologist who has been thinking of proceeding with upper endoscopy as well as colonoscopy.

## 2014-11-02 LAB — PTH, INTACT AND CALCIUM
Calcium: 8.8 mg/dL (ref 8.4–10.5)
PTH: 56 pg/mL (ref 14–64)

## 2014-11-02 LAB — VITAMIN D 25 HYDROXY (VIT D DEFICIENCY, FRACTURES): Vit D, 25-Hydroxy: 26 ng/mL — ABNORMAL LOW (ref 30–89)

## 2014-11-03 LAB — URINE CULTURE

## 2014-11-05 ENCOUNTER — Other Ambulatory Visit: Payer: Self-pay | Admitting: Gynecology

## 2014-11-05 ENCOUNTER — Telehealth: Payer: Self-pay

## 2014-11-05 ENCOUNTER — Telehealth: Payer: Self-pay | Admitting: *Deleted

## 2014-11-05 DIAGNOSIS — G8929 Other chronic pain: Secondary | ICD-10-CM

## 2014-11-05 DIAGNOSIS — R1011 Right upper quadrant pain: Principal | ICD-10-CM

## 2014-11-05 DIAGNOSIS — E559 Vitamin D deficiency, unspecified: Secondary | ICD-10-CM

## 2014-11-05 NOTE — Telephone Encounter (Signed)
Patient was informed of below. She said she has faithfully been taking her 2000 units of Vit D daily.  Does that change your recommendation?

## 2014-11-05 NOTE — Telephone Encounter (Signed)
Stay on same dose and I would like to repeat her vitamin D level in 6 months

## 2014-11-05 NOTE — Telephone Encounter (Signed)
-----   Message from Terrance Mass, MD sent at 11/05/2014  8:22 AM EST ----- Presentation the patient is taking her vitamin D 2000 units daily. I would like to repeat her vitamin D level in 6 months it is borderline low

## 2014-11-05 NOTE — Telephone Encounter (Signed)
-----   Message from Terrance Mass, MD sent at 11/01/2014 10:40 AM EST ----- Please schedule upper abdominal ultrasound for this patient with chronic RUQ pains.

## 2014-11-05 NOTE — Telephone Encounter (Signed)
Appointment 11/07/14 @ 8:30 am pt informed must be NPO after midnight.

## 2014-11-06 ENCOUNTER — Encounter (HOSPITAL_COMMUNITY)
Admission: RE | Admit: 2014-11-06 | Discharge: 2014-11-06 | Disposition: A | Discharge status: Home / Self Care | Payer: Medicare Other | Source: Ambulatory Visit | Attending: Pulmonary Disease | Admitting: Pulmonary Disease

## 2014-11-06 DIAGNOSIS — J449 Chronic obstructive pulmonary disease, unspecified: Secondary | ICD-10-CM | POA: Diagnosis not present

## 2014-11-06 DIAGNOSIS — M2550 Pain in unspecified joint: Secondary | ICD-10-CM | POA: Diagnosis not present

## 2014-11-06 DIAGNOSIS — M0579 Rheumatoid arthritis with rheumatoid factor of multiple sites without organ or systems involvement: Secondary | ICD-10-CM | POA: Diagnosis not present

## 2014-11-06 DIAGNOSIS — Z5189 Encounter for other specified aftercare: Secondary | ICD-10-CM | POA: Diagnosis not present

## 2014-11-06 DIAGNOSIS — Z79899 Other long term (current) drug therapy: Secondary | ICD-10-CM | POA: Diagnosis not present

## 2014-11-06 NOTE — Progress Notes (Addendum)
Today, [name] exercised at [facility]. Service time was from 1330 to 1450.  The patient exercised for more than 31 minutes performing aerobic, strengthening, and stretching exercises. Oxygen saturation, heart rate, blood pressure, rate of perceived exertion, and shortness of breath were all monitored before, during, and after exercise. Lisa Vega presented with no problems at today's exercise session. Lisa Vega only exercised on two stations because she had to leave for a scheduled MD appt.  There was no workload change during today's exercise session.  Pre-exercise vitals: . Weight kg: 74.2 . Liters of O2: ra . SpO2: 97 . HR: 68 . BP: 124/72 . CBG: na  Exercise vitals: . Highest heartrate:  75 . Lowest oxygen saturation: 96 . Highest blood pressure: 132/68 . Liters of 02: ra  Post-exercise vitals: . SpO2: 98 . HR: 68 . BP: 122/60 . Liters of O2: ra . CBG: na  Dr. Brand Males, Medical Director Dr. Wynelle Cleveland is immediately available during today's Pulmonary Rehab session for [name] on 11/06/2014 at 1330 class time.

## 2014-11-07 ENCOUNTER — Ambulatory Visit (HOSPITAL_COMMUNITY)
Admission: RE | Admit: 2014-11-07 | Discharge: 2014-11-07 | Disposition: A | Discharge status: Home / Self Care | Payer: Medicare Other | Source: Ambulatory Visit | Attending: Gynecology | Admitting: Gynecology

## 2014-11-07 DIAGNOSIS — R1011 Right upper quadrant pain: Secondary | ICD-10-CM | POA: Insufficient documentation

## 2014-11-07 DIAGNOSIS — G8929 Other chronic pain: Secondary | ICD-10-CM

## 2014-11-07 DIAGNOSIS — H109 Unspecified conjunctivitis: Secondary | ICD-10-CM | POA: Diagnosis not present

## 2014-11-07 DIAGNOSIS — H04123 Dry eye syndrome of bilateral lacrimal glands: Secondary | ICD-10-CM | POA: Diagnosis not present

## 2014-11-08 ENCOUNTER — Encounter (HOSPITAL_COMMUNITY)
Admission: RE | Admit: 2014-11-08 | Discharge: 2014-11-08 | Disposition: A | Discharge status: Home / Self Care | Payer: Medicare Other | Source: Ambulatory Visit | Attending: Pulmonary Disease | Admitting: Pulmonary Disease

## 2014-11-08 DIAGNOSIS — Z5189 Encounter for other specified aftercare: Secondary | ICD-10-CM | POA: Diagnosis not present

## 2014-11-08 NOTE — Progress Notes (Signed)
Today, [name] exercised at [facility]. Service time was from 1330 to 1520.  The patient exercised for more than 31 minutes performing aerobic, strengthening, and stretching exercises. Oxygen saturation, heart rate, blood pressure, rate of perceived exertion, and shortness of breath were all monitored before, during, and after exercise. Lisa Vega presented with no problems at today's exercise session. Lisa Vega also attended an education session on oxygen use and safety.  There was no workload change during today's exercise session.  Pre-exercise vitals: . Weight kg: 74.3 . Liters of O2: ra . SpO2: 97 . HR: 71 . BP: 118/60 . CBG: na  Exercise vitals: . Highest heartrate:  127 . Lowest oxygen saturation: 92 . Highest blood pressure: 104/58 . Liters of 02: ra  Post-exercise vitals: . SpO2: 96 . HR: 67 . BP: 106/60 . Liters of O2: ra . CBG: na  Dr. Brand Males, Medical Director [name] is immediately available during today's Pulmonary Rehab session for Lisa Vega on 11/08/2014 at 1330 class time.

## 2014-11-13 ENCOUNTER — Encounter (HOSPITAL_COMMUNITY): Payer: Medicare Other

## 2014-11-13 ENCOUNTER — Telehealth (HOSPITAL_COMMUNITY): Payer: Self-pay | Admitting: *Deleted

## 2014-11-15 ENCOUNTER — Telehealth (HOSPITAL_COMMUNITY): Payer: Self-pay | Admitting: Family Medicine

## 2014-11-15 ENCOUNTER — Encounter (HOSPITAL_COMMUNITY): Payer: Medicare Other

## 2014-11-16 DIAGNOSIS — L304 Erythema intertrigo: Secondary | ICD-10-CM | POA: Diagnosis not present

## 2014-11-16 DIAGNOSIS — L719 Rosacea, unspecified: Secondary | ICD-10-CM | POA: Diagnosis not present

## 2014-11-20 ENCOUNTER — Encounter (HOSPITAL_COMMUNITY): Payer: Medicare Other

## 2014-11-27 ENCOUNTER — Encounter (HOSPITAL_COMMUNITY)
Admission: RE | Admit: 2014-11-27 | Discharge: 2014-11-27 | Disposition: A | Discharge status: Home / Self Care | Payer: Medicare Other | Source: Ambulatory Visit | Attending: Pulmonary Disease | Admitting: Pulmonary Disease

## 2014-11-27 DIAGNOSIS — Z5189 Encounter for other specified aftercare: Secondary | ICD-10-CM | POA: Diagnosis not present

## 2014-11-27 DIAGNOSIS — J449 Chronic obstructive pulmonary disease, unspecified: Secondary | ICD-10-CM | POA: Insufficient documentation

## 2014-11-27 DIAGNOSIS — C3490 Malignant neoplasm of unspecified part of unspecified bronchus or lung: Secondary | ICD-10-CM | POA: Diagnosis not present

## 2014-11-27 DIAGNOSIS — R0602 Shortness of breath: Secondary | ICD-10-CM | POA: Insufficient documentation

## 2014-11-27 NOTE — Progress Notes (Signed)
Today, [name] exercised at [facility]. Service time was from 1330 to 1500.  The patient exercised for more than 31 minutes performing aerobic, strengthening, and stretching exercises. Oxygen saturation, heart rate, blood pressure, rate of perceived exertion, and shortness of breath were all monitored before, during, and after exercise. [name] presented with no problems at today's exercise session.   There was a workload change during today's exercise session.  Pre-exercise vitals: . Weight kg: 74.5 . Liters of O2: ra . SpO2: ra . HR: 96 . BP: 64 . CBG: na  Exercise vitals: . Highest heartrate:  90 . Lowest oxygen saturation: 93 . Highest blood pressure: 130/72 . Liters of 02: ra  Post-exercise vitals: . SpO2: 96 . HR: 67 . BP: 112/56 . Liters of O2: ra . CBG: na  Dr. Brand Males, Medical Director Dr. Waldron Labs is immediately available during today's Pulmonary Rehab session for Lisa Vega on 11/27/2014 at 1330 class time.

## 2014-11-29 ENCOUNTER — Encounter (HOSPITAL_COMMUNITY)
Admission: RE | Admit: 2014-11-29 | Discharge: 2014-11-29 | Disposition: A | Discharge status: Home / Self Care | Payer: Medicare Other | Source: Ambulatory Visit | Attending: Pulmonary Disease | Admitting: Pulmonary Disease

## 2014-11-29 DIAGNOSIS — C3490 Malignant neoplasm of unspecified part of unspecified bronchus or lung: Secondary | ICD-10-CM | POA: Diagnosis not present

## 2014-11-29 DIAGNOSIS — R0602 Shortness of breath: Secondary | ICD-10-CM | POA: Diagnosis not present

## 2014-11-29 DIAGNOSIS — Z5189 Encounter for other specified aftercare: Secondary | ICD-10-CM | POA: Diagnosis not present

## 2014-11-29 DIAGNOSIS — J449 Chronic obstructive pulmonary disease, unspecified: Secondary | ICD-10-CM | POA: Diagnosis not present

## 2014-11-29 NOTE — Progress Notes (Signed)
Today, Thomasina exercised at [employer]. Service time was from 1330 to 1550.  The patient exercised for more than 31 minutes performing aerobic, strengthening, and stretching exercises. Oxygen saturation, heart rate, blood pressure, rate of perceived exertion, and shortness of breath were all monitored before, during, and after exercise. Tekisha presented with no problems at today's exercise session. Kauri also attended an education session on pulmonary medications.  There was no workload change during today's exercise session.  Pre-exercise vitals: . Weight kg: 74.1 . Liters of O2: ra . SpO2: 98 . HR: 67 . BP: 118/76 . CBG: na  Exercise vitals: . Highest heartrate:  79 . Lowest oxygen saturation: 94 . Highest blood pressure: 102/60 . Liters of 02: ra  Post-exercise vitals: . SpO2: 99 . HR: 69 . BP: 126/64 . Liters of O2: ra . CBG: na  Dr. Brand Males, Medical Director Dr. Tana Coast is immediately available during today's Pulmonary Rehab session for [name] on 11/29/2014 at 1330 class time.

## 2014-12-04 ENCOUNTER — Encounter (HOSPITAL_COMMUNITY)
Admission: RE | Admit: 2014-12-04 | Discharge: 2014-12-04 | Disposition: A | Discharge status: Home / Self Care | Payer: Medicare Other | Source: Ambulatory Visit | Attending: Pulmonary Disease | Admitting: Pulmonary Disease

## 2014-12-04 DIAGNOSIS — R0602 Shortness of breath: Secondary | ICD-10-CM | POA: Diagnosis not present

## 2014-12-04 DIAGNOSIS — C3490 Malignant neoplasm of unspecified part of unspecified bronchus or lung: Secondary | ICD-10-CM | POA: Diagnosis not present

## 2014-12-04 DIAGNOSIS — Z5189 Encounter for other specified aftercare: Secondary | ICD-10-CM | POA: Diagnosis not present

## 2014-12-04 DIAGNOSIS — J449 Chronic obstructive pulmonary disease, unspecified: Secondary | ICD-10-CM | POA: Diagnosis not present

## 2014-12-04 NOTE — Progress Notes (Signed)
Today, Nakkia exercised at [employer]. Service time was from 1330 to 1515.  The patient exercised for more than 31 minutes performing aerobic, strengthening, and stretching exercises. Oxygen saturation, heart rate, blood pressure, rate of perceived exertion, and shortness of breath were all monitored before, during, and after exercise. Hatley presented with no problems at today's exercise session.   There was no workload change during today's exercise session.  Pre-exercise vitals: . Weight kg: 74.3 . Liters of O2: ra . SpO2: 95 . HR: 70 . BP: 102/56 . CBG: na  Exercise vitals: . Highest heartrate:  78 . Lowest oxygen saturation: 96 . Highest blood pressure: 130/64 . Liters of 02: ra  Post-exercise vitals: . SpO2: 94 . HR: 64 . BP: 100/62 . Liters of O2: ra . CBG: na  Dr. Brand Males, Medical Director Dr. Tana Coast is immediately available during today's Pulmonary Rehab session for [name] on 12/04/2014 at 1330 class time.

## 2014-12-06 ENCOUNTER — Encounter (HOSPITAL_COMMUNITY): Payer: Medicare Other

## 2014-12-10 DIAGNOSIS — L719 Rosacea, unspecified: Secondary | ICD-10-CM | POA: Diagnosis not present

## 2014-12-11 ENCOUNTER — Encounter (HOSPITAL_COMMUNITY)
Admission: RE | Admit: 2014-12-11 | Discharge: 2014-12-11 | Disposition: A | Discharge status: Home / Self Care | Payer: Medicare Other | Source: Ambulatory Visit | Attending: Pulmonary Disease | Admitting: Pulmonary Disease

## 2014-12-11 DIAGNOSIS — J449 Chronic obstructive pulmonary disease, unspecified: Secondary | ICD-10-CM | POA: Diagnosis not present

## 2014-12-11 DIAGNOSIS — Z5189 Encounter for other specified aftercare: Secondary | ICD-10-CM | POA: Diagnosis not present

## 2014-12-11 DIAGNOSIS — C3490 Malignant neoplasm of unspecified part of unspecified bronchus or lung: Secondary | ICD-10-CM | POA: Diagnosis not present

## 2014-12-11 DIAGNOSIS — R0602 Shortness of breath: Secondary | ICD-10-CM | POA: Diagnosis not present

## 2014-12-11 NOTE — Progress Notes (Signed)
Today, Carlo exercised at [employer]. Service time was from 1330 to 1500.  The patient exercised for more than 31 minutes performing aerobic, strengthening, and stretching exercises. Oxygen saturation, heart rate, blood pressure, rate of perceived exertion, and shortness of breath were all monitored before, during, and after exercise. Lolitha presented with no problems at today's exercise session.   There was no workload change during today's exercise session.  Pre-exercise vitals: . Weight kg: 74.3 . Liters of O2: ra . SpO2: 95 . HR: 76 . BP: 110/64 . CBG: na  Exercise vitals: . Highest heartrate:  84 . Lowest oxygen saturation: 95 . Highest blood pressure: 124/72 . Liters of 02: ra  Post-exercise vitals: . SpO2: 97 . HR: 66 . BP: 104/70 . Liters of O2: ra . CBG: na  Dr. Brand Males, Medical Director Dr. Coralyn Pear is immediately available during today's Pulmonary Rehab session for [name] on 12/11/2014 at 1330 class time.

## 2014-12-13 ENCOUNTER — Encounter (HOSPITAL_COMMUNITY)
Admission: RE | Admit: 2014-12-13 | Discharge: 2014-12-13 | Disposition: A | Discharge status: Home / Self Care | Payer: Medicare Other | Source: Ambulatory Visit | Attending: Pulmonary Disease | Admitting: Pulmonary Disease

## 2014-12-13 DIAGNOSIS — C3490 Malignant neoplasm of unspecified part of unspecified bronchus or lung: Secondary | ICD-10-CM | POA: Diagnosis not present

## 2014-12-13 DIAGNOSIS — Z5189 Encounter for other specified aftercare: Secondary | ICD-10-CM | POA: Diagnosis not present

## 2014-12-13 DIAGNOSIS — J449 Chronic obstructive pulmonary disease, unspecified: Secondary | ICD-10-CM | POA: Diagnosis not present

## 2014-12-13 DIAGNOSIS — R0602 Shortness of breath: Secondary | ICD-10-CM | POA: Diagnosis not present

## 2014-12-13 NOTE — Progress Notes (Signed)
Lisa Vega completed a Six-Minute Walk Test on 12/13/14 . Lisa Vega walked 1,165 feet with 0 breaks.  The patient's lowest oxygen saturation was 89% , highest heart rate was 94 , and highest blood pressure was 160/54. The patient was on room air.  Lisa Vega stated that nothing hindered her walk test.

## 2014-12-18 ENCOUNTER — Encounter (HOSPITAL_COMMUNITY): Payer: Medicare Other

## 2014-12-18 NOTE — Progress Notes (Signed)
Discharge Note from Pulmonary Rehab  Patient graduated from pulmonary rehab as of 12/13/2014.  She was a pleasure to have in the program, her attendance was very regular, and she met her goals which were to establish an exercise regime and to learn to manage her shortness of breath.  Patient has rheumatoid arthritis and was able to balance exercise at the level to keep her arthritis from flaring.  She plans on exercising at home after graduation.

## 2014-12-20 ENCOUNTER — Encounter (HOSPITAL_COMMUNITY): Payer: Medicare Other

## 2014-12-25 ENCOUNTER — Encounter (HOSPITAL_COMMUNITY): Payer: Medicare Other

## 2014-12-27 ENCOUNTER — Encounter (HOSPITAL_COMMUNITY): Payer: Medicare Other

## 2015-01-01 ENCOUNTER — Encounter (HOSPITAL_COMMUNITY): Payer: Medicare Other

## 2015-01-03 ENCOUNTER — Encounter (HOSPITAL_COMMUNITY): Payer: Medicare Other

## 2015-01-08 ENCOUNTER — Ambulatory Visit (INDEPENDENT_AMBULATORY_CARE_PROVIDER_SITE_OTHER): Payer: Medicare Other | Admitting: Pulmonary Disease

## 2015-01-08 ENCOUNTER — Encounter: Payer: Self-pay | Admitting: Pulmonary Disease

## 2015-01-08 ENCOUNTER — Encounter (HOSPITAL_COMMUNITY): Payer: Medicare Other

## 2015-01-08 ENCOUNTER — Ambulatory Visit (INDEPENDENT_AMBULATORY_CARE_PROVIDER_SITE_OTHER)
Admission: RE | Admit: 2015-01-08 | Discharge: 2015-01-08 | Disposition: A | Discharge status: Home / Self Care | Payer: Medicare Other | Source: Ambulatory Visit | Attending: Pulmonary Disease | Admitting: Pulmonary Disease

## 2015-01-08 VITALS — BP 120/76 | HR 72 | Temp 97.8°F | Ht 64.75 in | Wt 162.2 lb

## 2015-01-08 DIAGNOSIS — J441 Chronic obstructive pulmonary disease with (acute) exacerbation: Secondary | ICD-10-CM

## 2015-01-08 DIAGNOSIS — J438 Other emphysema: Secondary | ICD-10-CM | POA: Diagnosis not present

## 2015-01-08 DIAGNOSIS — J984 Other disorders of lung: Secondary | ICD-10-CM | POA: Diagnosis not present

## 2015-01-08 DIAGNOSIS — I517 Cardiomegaly: Secondary | ICD-10-CM | POA: Diagnosis not present

## 2015-01-08 MED ORDER — PREDNISONE 10 MG PO TABS: ORAL_TABLET | ORAL | Status: DC

## 2015-01-08 NOTE — Assessment & Plan Note (Signed)
The patient had a recent episode of acute bronchitis over the Christmas holidays, and Lisa Vega was treated with a Z-Pak with significantly decreased mucus and congestion.  Despite this, Lisa Vega has continued to have some increasing shortness of breath and restriction to airflow. I think Lisa Vega would benefit from a course of prednisone to help with airway inflammation. I have asked her to continue on her usual maintenance bronchodilator regimen.

## 2015-01-08 NOTE — Patient Instructions (Signed)
Continue on symbicort and spiriva Will treat with a course of prednisone over 8 days. Will check a chest xray today, and call you with results.  Keep followup apptm with me as scheduled.

## 2015-01-08 NOTE — Progress Notes (Signed)
   Subjective:    Patient ID: Lisa Vega, female    DOB: [birth year], 81 y.o.   MRN: [id number]  HPI Patient comes in today for an acute sick visit. She has known COPD, and recently had an episode of acute bronchitis over the Christmas holidays. She was treated with an antibiotic while out of town, and although her congestion and mucus have improved, she still has residual shortness of breath. She does not have any fevers, chills, or sweats. We had changed her from Spiriva HandiHaler to the Respimat, and she thinks this is definitely easier on her throat irritation. However, she has a 90 day supply at home of the HandiHaler, and would like to finish this before changing over to the Respimat.   Review of Systems  Constitutional: Negative for fever and unexpected weight change.  HENT: Positive for postnasal drip and sinus pressure. Negative for congestion, dental problem, ear pain, nosebleeds, rhinorrhea, sneezing, sore throat and trouble swallowing.   Eyes: Negative for redness and itching.  Respiratory: Positive for cough, chest tightness and shortness of breath. Negative for wheezing.   Cardiovascular: Negative for palpitations and leg swelling.  Gastrointestinal: Negative for nausea and vomiting.  Genitourinary: Negative for dysuria.  Musculoskeletal: Negative for joint swelling.  Skin: Negative for rash.  Neurological: Negative for headaches.  Hematological: Does not bruise/bleed easily.  Psychiatric/Behavioral: Negative for dysphoric mood. The patient is not nervous/anxious.        Objective:   Physical Exam Well-developed female in no acute distress Nose without purulence or discharge noted Neck without lymphadenopathy or thyromegaly Chest with mildly decreased breath sounds, no active wheezes or crackles Cardiac exam with regular rate and rhythm Lower extremities with no significant edema, no cyanosis Alert and oriented, moves all 4 extremities.       Assessment & Plan:

## 2015-01-10 ENCOUNTER — Encounter (HOSPITAL_COMMUNITY): Payer: Medicare Other

## 2015-01-15 ENCOUNTER — Encounter (HOSPITAL_COMMUNITY): Payer: Medicare Other

## 2015-01-17 ENCOUNTER — Encounter (HOSPITAL_COMMUNITY): Payer: Medicare Other

## 2015-01-17 DIAGNOSIS — F411 Generalized anxiety disorder: Secondary | ICD-10-CM | POA: Diagnosis not present

## 2015-01-17 DIAGNOSIS — J449 Chronic obstructive pulmonary disease, unspecified: Secondary | ICD-10-CM | POA: Diagnosis not present

## 2015-01-17 DIAGNOSIS — R002 Palpitations: Secondary | ICD-10-CM | POA: Diagnosis not present

## 2015-01-17 DIAGNOSIS — K589 Irritable bowel syndrome without diarrhea: Secondary | ICD-10-CM | POA: Diagnosis not present

## 2015-01-22 ENCOUNTER — Encounter (HOSPITAL_COMMUNITY): Payer: Medicare Other

## 2015-01-24 ENCOUNTER — Encounter (HOSPITAL_COMMUNITY): Payer: Medicare Other

## 2015-01-29 ENCOUNTER — Encounter (HOSPITAL_COMMUNITY): Payer: Medicare Other

## 2015-01-29 ENCOUNTER — Ambulatory Visit (INDEPENDENT_AMBULATORY_CARE_PROVIDER_SITE_OTHER): Payer: Medicare Other | Admitting: Gynecology

## 2015-01-29 ENCOUNTER — Encounter: Payer: Self-pay | Admitting: Gynecology

## 2015-01-29 VITALS — BP 132/70 | Temp 97.4°F

## 2015-01-29 DIAGNOSIS — B3731 Acute candidiasis of vulva and vagina: Secondary | ICD-10-CM

## 2015-01-29 DIAGNOSIS — L292 Pruritus vulvae: Secondary | ICD-10-CM

## 2015-01-29 DIAGNOSIS — B373 Candidiasis of vulva and vagina: Secondary | ICD-10-CM

## 2015-01-29 DIAGNOSIS — Z8639 Personal history of other endocrine, nutritional and metabolic disease: Secondary | ICD-10-CM

## 2015-01-29 DIAGNOSIS — K5909 Other constipation: Secondary | ICD-10-CM

## 2015-01-29 DIAGNOSIS — Z4689 Encounter for fitting and adjustment of other specified devices: Secondary | ICD-10-CM | POA: Diagnosis not present

## 2015-01-29 DIAGNOSIS — L304 Erythema intertrigo: Secondary | ICD-10-CM

## 2015-01-29 LAB — WET PREP FOR TRICH, YEAST, CLUE
Clue Cells Wet Prep HPF POC: NONE SEEN
Trich, Wet Prep: NONE SEEN

## 2015-01-29 MED ORDER — NYSTATIN-TRIAMCINOLONE 100000-0.1 UNIT/GM-% EX CREA: 1.0000 "application " | TOPICAL_CREAM | Freq: Three times a day (TID) | CUTANEOUS | Status: DC

## 2015-01-29 MED ORDER — LINACLOTIDE 145 MCG PO CAPS: 145.0000 ug | ORAL_CAPSULE | Freq: Every day | ORAL | Status: DC

## 2015-01-29 MED ORDER — VITAMIN D (ERGOCALCIFEROL) 1.25 MG (50000 UNIT) PO CAPS: ORAL_CAPSULE | ORAL | Status: DC

## 2015-01-29 NOTE — Patient Instructions (Signed)
Linaclotide oral capsules What is this medicine? LINACLOTIDE (lin a KLOE tide) is used to treat irritable bowel syndrome (IBS) with constipation as the main problem. It may also be used for relief of chronic constipation. This medicine may be used for other purposes; ask your health care provider or pharmacist if you have questions. COMMON BRAND NAME(S): Linzess What should I tell my health care provider before I take this medicine? They need to know if you have any of these conditions: -history of stool (fecal) impaction -now have diarrhea or have diarrhea often -other medical condition -stomach or intestinal disease, including bowel obstruction or abdominal adhesions -an unusual or allergic reaction to linaclotide, other medicines, foods, dyes, or preservatives -pregnant or trying to get pregnant -breast-feeding How should I use this medicine? Take this medicine by mouth with a glass of water. Follow the directions on the prescription label. Do not cut, crush or chew this medicine. Take on an empty stomach, at least 30 minutes before your first meal of the day. Take your medicine at regular intervals. Do not take your medicine more often than directed. Do not stop taking except on your doctor's advice. A special MedGuide will be given to you by the pharmacist with each prescription and refill. Be sure to read this information carefully each time. Talk to your pediatrician regarding the use of this medicine in children. This medicine is not approved for use in children. Overdosage: If you think you've taken too much of this medicine contact a poison control center or emergency room at once. Overdosage: If you think you have taken too much of this medicine contact a poison control center or emergency room at once. NOTE: This medicine is only for you. Do not share this medicine with others. What if I miss a dose? If you miss a dose, just skip that dose. Wait until your next dose, and take only  that dose. Do not take double or extra doses. What may interact with this medicine? -certain medicines for bowel problems or bladder incontinence (these can cause constipation) This list may not describe all possible interactions. Give your health care provider a list of all the medicines, herbs, non-prescription drugs, or dietary supplements you use. Also tell them if you smoke, drink alcohol, or use illegal drugs. Some items may interact with your medicine. What should I watch for while using this medicine? Visit your doctor for regular check ups. Tell your doctor if your symptoms do not get better or if they get worse. Diarrhea is a common side effect of this medicine. It often begins within 2 weeks of starting this medicine. Stop taking this medicine and call your doctor if you get severe diarrhea. Stop taking this medicine and call your doctor or go to the nearest hospital emergency room right away if you develop unusual or severe stomach-area (abdominal) pain, especially if you also have bright red, bloody stools or black stools that look like tar. What side effects may I notice from receiving this medicine? Side effects that you should report to your doctor or health care professional as soon as possible: -allergic reactions like skin rash, itching or hives, swelling of the face, lips, or tongue -black, tarry stools -bloody or watery diarrhea -new or worsening stomach pain -severe or prolonged diarrhea Side effects that usually do not require medical attention (Report these to your doctor or health care professional if they continue or are bothersome.): -bloating -gas -loose stools This list may not describe all possible side effects.  Call your doctor for medical advice about side effects. You may report side effects to FDA at 1-800-FDA-1088. Where should I keep my medicine? Keep out of the reach of children. Store at room temperature between 20 and 25 degrees C (68 and 77 degrees F).  Keep this medicine in the original container. Keep tightly closed in a dry place. Do not remove the desiccant packet from the bottle, it helps to protect your medicine from moisture. Throw away any unused medicine after the expiration date. NOTE: This sheet is a summary. It may not cover all possible information. If you have questions about this medicine, talk to your doctor, pharmacist, or health care provider.  2015, Elsevier/Gold Standard. (2011-09-01 13:05:27) Constipation Constipation is when a person has fewer than three bowel movements a week, has difficulty having a bowel movement, or has stools that are dry, hard, or larger than normal. As people grow older, constipation is more common. If you try to fix constipation with medicines that make you have a bowel movement (laxatives), the problem may get worse. Long-term laxative use may cause the muscles of the colon to become weak. A low-fiber diet, not taking in enough fluids, and taking certain medicines may make constipation worse.  CAUSES   Certain medicines, such as antidepressants, pain medicine, iron supplements, antacids, and water pills.   Certain diseases, such as diabetes, irritable bowel syndrome (IBS), thyroid disease, or depression.   Not drinking enough water.   Not eating enough fiber-rich foods.   Stress or travel.   Lack of physical activity or exercise.   Ignoring the urge to have a bowel movement.   Using laxatives too much.  SIGNS AND SYMPTOMS   Having fewer than three bowel movements a week.   Straining to have a bowel movement.   Having stools that are hard, dry, or larger than normal.   Feeling full or bloated.   Pain in the lower abdomen.   Not feeling relief after having a bowel movement.  DIAGNOSIS  Your health care provider will take a medical history and perform a physical exam. Further testing may be done for severe constipation. Some tests may include:  A barium enema X-ray to  examine your rectum, colon, and, sometimes, your small intestine.   A sigmoidoscopy to examine your lower colon.   A colonoscopy to examine your entire colon. TREATMENT  Treatment will depend on the severity of your constipation and what is causing it. Some dietary treatments include drinking more fluids and eating more fiber-rich foods. Lifestyle treatments may include regular exercise. If these diet and lifestyle recommendations do not help, your health care provider may recommend taking over-the-counter laxative medicines to help you have bowel movements. Prescription medicines may be prescribed if over-the-counter medicines do not work.  HOME CARE INSTRUCTIONS   Eat foods that have a lot of fiber, such as fruits, vegetables, whole grains, and beans.  Limit foods high in fat and processed sugars, such as french fries, hamburgers, cookies, candies, and soda.   A fiber supplement may be added to your diet if you cannot get enough fiber from foods.   Drink enough fluids to keep your urine clear or pale yellow.   Exercise regularly or as directed by your health care provider.   Go to the restroom when you have the urge to go. Do not hold it.   Only take over-the-counter or prescription medicines as directed by your health care provider. Do not take other medicines for constipation without talking  to your health care provider first.  [place] IF:   You have bright red blood in your stool.   Your constipation lasts for more than 4 days or gets worse.   You have abdominal or rectal pain.   You have thin, pencil-like stools.   You have unexplained weight loss. MAKE SURE YOU:   Understand these instructions.  Will watch your condition.  Will get help right away if you are not doing well or get worse. Document Released: 09/11/2004 Document Revised: 12/19/2013 Document Reviewed: 09/25/2013 [facility] Patient Information 2015 [place], [place]. This  information is not intended to replace advice given to you by your health care provider. Make sure you discuss any questions you have with your health care provider.

## 2015-01-29 NOTE — Progress Notes (Signed)
    Patient is an 81 year old who presented to the office today for maintenance on her pessary. Patient with history of uterine prolapse along with cystocele and rectocele and history of urinary incontinence. Patient with multiple comorbidities not surgical candidate see past medical history list. She has done well with a ring pessary. Patient also was complaining of slight vaginal irritation and pruritus. Also underneath her breasts and groin region. Patient is been complaining of constipation and is scheduled for upper endoscopy and colonoscopy in March.  Patient with history of vitamin D deficiency had been placed on vitamin D 50,000 units every weekly for 12 weeks in May of last year her vitamin D level was 27. Upon three-month completion her vitamin D level was at 26. Her calcium and PTH were normal. Her bone density study last year demonstrated that her lowest T score was at the left femoral neck with a value of -2.2.Her FRAX analysis placed her in a high risk category for fracture as follows: 10 year fracture risk of the hip 20% (threshold is 3%) risk of any major osteoporotic fractures 37% over the course of the next 10 years (threshold 20%). Patient was started on alendronate 70 mg every weekly which she has tolerated well and this in June and will be a year since initiation at which time we will need to follow-up with a bone density study. She is currently taking vitamin D3 2000 units daily.  The pessary was removed and cleaned and replaced back into the vagina. Prior to this a wet prep demonstrated few yeast. Bartholin, Urethra, Skene Glands: Within normal limits  Vagina: No gross lesions or discharge, atrophic changes, second-degree cystocele and second-degree rectocele and first degree uterine descensus  Cervix: No gross lesions or discharge  Uterus axial, normal size, shape and consistency, non-tender and mobile  Adnexa Without masses or tenderness   Assessment/plan: #1 pelvic organ  prolapse along with cystocele and rectocele and urinary incontinence has done well with ring pessary. She will continue to come every 3 months to have it cleaned because of her severe rheumatoid arthritis makes it difficult for her. She will continue to use vaginal estrogen 2 times a week. #2 for her yeast infection she'll be prescribed Diflucan 150 mg one by mouth with 2 refills #3 for breast and groin intertrigo mytrex cream was prescribed to use 2-3 times a day for 7-10 days when necessary #4 for her vitamin D deficiency she will be asked to discontinue the vitamin D3 2000 units daily and take vitamin D 50,000 units every other week #5 when she returns back in 3 months for pessary cleaning we will check her vitamin D level at that time. #6 for her chronic constipation she will be prescribed Linzess 145 g tablet one by mouth daily. She was encouraged to continue with her fiber diet and fluid intake

## 2015-01-31 ENCOUNTER — Encounter (HOSPITAL_COMMUNITY): Payer: Medicare Other

## 2015-01-31 DIAGNOSIS — M5090 Cervical disc disorder, unspecified, unspecified cervical region: Secondary | ICD-10-CM | POA: Diagnosis not present

## 2015-01-31 DIAGNOSIS — M25511 Pain in right shoulder: Secondary | ICD-10-CM | POA: Diagnosis not present

## 2015-02-05 ENCOUNTER — Encounter (HOSPITAL_COMMUNITY): Payer: Medicare Other

## 2015-02-07 ENCOUNTER — Encounter (HOSPITAL_COMMUNITY): Payer: Medicare Other

## 2015-02-13 DIAGNOSIS — Z961 Presence of intraocular lens: Secondary | ICD-10-CM | POA: Diagnosis not present

## 2015-02-13 DIAGNOSIS — H1851 Endothelial corneal dystrophy: Secondary | ICD-10-CM | POA: Diagnosis not present

## 2015-02-13 DIAGNOSIS — L718 Other rosacea: Secondary | ICD-10-CM | POA: Diagnosis not present

## 2015-02-13 DIAGNOSIS — H1032 Unspecified acute conjunctivitis, left eye: Secondary | ICD-10-CM | POA: Diagnosis not present

## 2015-02-26 DIAGNOSIS — L739 Follicular disorder, unspecified: Secondary | ICD-10-CM | POA: Diagnosis not present

## 2015-03-06 DIAGNOSIS — Z961 Presence of intraocular lens: Secondary | ICD-10-CM | POA: Diagnosis not present

## 2015-03-06 DIAGNOSIS — H1032 Unspecified acute conjunctivitis, left eye: Secondary | ICD-10-CM | POA: Diagnosis not present

## 2015-03-06 DIAGNOSIS — H1851 Endothelial corneal dystrophy: Secondary | ICD-10-CM | POA: Diagnosis not present

## 2015-03-13 ENCOUNTER — Encounter: Payer: Self-pay | Admitting: Pulmonary Disease

## 2015-03-13 ENCOUNTER — Ambulatory Visit (INDEPENDENT_AMBULATORY_CARE_PROVIDER_SITE_OTHER): Payer: Medicare Other | Admitting: Pulmonary Disease

## 2015-03-13 ENCOUNTER — Encounter (INDEPENDENT_AMBULATORY_CARE_PROVIDER_SITE_OTHER): Payer: Self-pay

## 2015-03-13 VITALS — BP 110/64 | HR 65 | Temp 97.6°F | Ht 64.75 in | Wt 160.2 lb

## 2015-03-13 DIAGNOSIS — H1032 Unspecified acute conjunctivitis, left eye: Secondary | ICD-10-CM | POA: Diagnosis not present

## 2015-03-13 DIAGNOSIS — J438 Other emphysema: Secondary | ICD-10-CM

## 2015-03-13 DIAGNOSIS — H1851 Endothelial corneal dystrophy: Secondary | ICD-10-CM | POA: Diagnosis not present

## 2015-03-13 DIAGNOSIS — Z961 Presence of intraocular lens: Secondary | ICD-10-CM | POA: Diagnosis not present

## 2015-03-13 NOTE — Progress Notes (Signed)
   Subjective:    Patient ID: Lisa Vega, female    DOB: [birth year], 81 y.o.   MRN: [id number]  HPI The patient comes in today for follow-up of her known COPD. At her last visit she had a COPD exacerbation, and responded well to a course of prednisone. She feels that she is back to her usual baseline, she denies any significant cough or congestion, and feels that her inhaled medication is helping her.   Review of Systems  Constitutional: Negative for fever and unexpected weight change.  HENT: Positive for congestion and postnasal drip. Negative for dental problem, ear pain, nosebleeds, rhinorrhea, sinus pressure, sneezing, sore throat and trouble swallowing.   Eyes: Negative for redness and itching.  Respiratory: Positive for cough. Negative for chest tightness, shortness of breath and wheezing.   Cardiovascular: Negative for palpitations and leg swelling.  Gastrointestinal: Negative for nausea and vomiting.  Genitourinary: Negative for dysuria.  Musculoskeletal: Negative for joint swelling.  Skin: Negative for rash.  Neurological: Negative for headaches.  Hematological: Does not bruise/bleed easily.  Psychiatric/Behavioral: Negative for dysphoric mood. The patient is not nervous/anxious.        Objective:   Physical Exam Well-developed female in no acute distress Nose without purulence or discharge noted Neck without lymphadenopathy or thyromegaly Chest with mildly decreased breath sounds, no wheezing Cardiac exam with regular rate and rhythm Lower extremities without edema, no cyanosis Alert and oriented, moves all 4 extremities.       Assessment & Plan:

## 2015-03-13 NOTE — Patient Instructions (Signed)
No change in your breathing medications. Work on conditioning and weight reduction followup with me again in 65mos.

## 2015-03-13 NOTE — Assessment & Plan Note (Signed)
The patient is doing well currently, and is trying to stay as active as possible. She has returned to baseline after her recent acute exacerbation. I have asked her to continue on her maintenance medications, and will see her back in 6 months.

## 2015-03-16 ENCOUNTER — Other Ambulatory Visit: Payer: Self-pay | Admitting: Gynecology

## 2015-03-20 DIAGNOSIS — R300 Dysuria: Secondary | ICD-10-CM | POA: Diagnosis not present

## 2015-03-20 DIAGNOSIS — M2550 Pain in unspecified joint: Secondary | ICD-10-CM | POA: Diagnosis not present

## 2015-03-20 DIAGNOSIS — Z79899 Other long term (current) drug therapy: Secondary | ICD-10-CM | POA: Diagnosis not present

## 2015-03-20 DIAGNOSIS — M0579 Rheumatoid arthritis with rheumatoid factor of multiple sites without organ or systems involvement: Secondary | ICD-10-CM | POA: Diagnosis not present

## 2015-03-20 DIAGNOSIS — J449 Chronic obstructive pulmonary disease, unspecified: Secondary | ICD-10-CM | POA: Diagnosis not present

## 2015-04-10 DIAGNOSIS — L718 Other rosacea: Secondary | ICD-10-CM | POA: Diagnosis not present

## 2015-04-10 DIAGNOSIS — D225 Melanocytic nevi of trunk: Secondary | ICD-10-CM | POA: Diagnosis not present

## 2015-04-17 DIAGNOSIS — H1032 Unspecified acute conjunctivitis, left eye: Secondary | ICD-10-CM | POA: Diagnosis not present

## 2015-04-17 DIAGNOSIS — Z961 Presence of intraocular lens: Secondary | ICD-10-CM | POA: Diagnosis not present

## 2015-04-17 DIAGNOSIS — H1851 Endothelial corneal dystrophy: Secondary | ICD-10-CM | POA: Diagnosis not present

## 2015-05-02 ENCOUNTER — Ambulatory Visit (INDEPENDENT_AMBULATORY_CARE_PROVIDER_SITE_OTHER): Payer: Medicare Other | Admitting: Gynecology

## 2015-05-02 ENCOUNTER — Encounter: Payer: Self-pay | Admitting: Gynecology

## 2015-05-02 VITALS — BP 140/80 | Ht 64.0 in | Wt 160.0 lb

## 2015-05-02 DIAGNOSIS — Z4689 Encounter for fitting and adjustment of other specified devices: Secondary | ICD-10-CM

## 2015-05-02 DIAGNOSIS — N819 Female genital prolapse, unspecified: Secondary | ICD-10-CM

## 2015-05-02 DIAGNOSIS — N952 Postmenopausal atrophic vaginitis: Secondary | ICD-10-CM

## 2015-05-02 DIAGNOSIS — Z8639 Personal history of other endocrine, nutritional and metabolic disease: Secondary | ICD-10-CM | POA: Diagnosis not present

## 2015-05-02 DIAGNOSIS — M810 Age-related osteoporosis without current pathological fracture: Secondary | ICD-10-CM | POA: Diagnosis not present

## 2015-05-02 DIAGNOSIS — N898 Other specified noninflammatory disorders of vagina: Secondary | ICD-10-CM | POA: Diagnosis not present

## 2015-05-02 DIAGNOSIS — E559 Vitamin D deficiency, unspecified: Secondary | ICD-10-CM | POA: Diagnosis not present

## 2015-05-02 LAB — WET PREP FOR TRICH, YEAST, CLUE
Clue Cells Wet Prep HPF POC: NONE SEEN
Trich, Wet Prep: NONE SEEN
Yeast Wet Prep HPF POC: NONE SEEN

## 2015-05-02 MED ORDER — VITAMIN D (ERGOCALCIFEROL) 1.25 MG (50000 UNIT) PO CAPS: ORAL_CAPSULE | ORAL | Status: DC

## 2015-05-02 MED ORDER — FLUCONAZOLE 150 MG PO TABS: ORAL_TABLET | ORAL | Status: DC

## 2015-05-02 MED ORDER — LINACLOTIDE 145 MCG PO CAPS: 145.0000 ug | ORAL_CAPSULE | Freq: Every day | ORAL | Status: DC

## 2015-05-02 MED ORDER — ALENDRONATE SODIUM 70 MG PO TABS: 70.0000 mg | ORAL_TABLET | ORAL | Status: DC

## 2015-05-02 MED ORDER — NONFORMULARY OR COMPOUNDED ITEM: Status: DC

## 2015-05-02 NOTE — Progress Notes (Signed)
   Patient is an 81 year old presented to the office today for maintenance of her pessary. Patient with history of uterine prolapse along with cystocele and rectocele and history of urinary incontinence. Patient with multiple comorbidities not surgical candidate see past medical history list. The patient has had history of lung cancer and lobectomy. She also has history of COPD. The patient is applying vaginal estrogen twice a week and has been doing well.She has done well with a ring pessary. She was complaining of some mild irritation questionable pruritus but no true discharge. Review of patient's records indicated also that she has vitamin D deficiency and she has completed twice vitamin D 50,000 units every weekly for 12 weeks. Also patient had a bone density study last year which had demonstrated the following:  Her bone density study had demonstrated that her labor was T score was at the left femoral neck with a value -2.2. Her FRAX analysis placed her in a high risk category for fracture as follows: 10 year fracture risk of the hip 20% (threshold is 3%) risk of any major osteoporotic fractures 37% over the course of the next 10 years (threshold 20%).  She was started on Fosamax 70 mg by mouth every weekly.  She is here for pessary maintenance because of her arthritis she is not able to take out her pessary cleaning.  Exam: Bartholin, Urethra, Skene Glands: Within normal limits  Vagina: No gross lesions or discharge, atrophic changes, second-degree cystocele and second-degree rectocele and first degree uterine descensus  Cervix: No gross lesions or discharge  Uterus axial, normal size, shape and consistency, non-tender and mobile  Adnexa Without masses or tenderness  Wet prep was negative  Assessment/plan: #61 81 year old female with multiple comorbidities and pelvic organ prolapse doing well with ring pessary. Patient will continue to apply vaginal estrogen twice a week and return to  the office in 3 months for her annual gynecological exam in addition to the pessary maintenance. #2 vitamin D deficiency will check vitamin D level today. Patient was instructed to take vitamin D 50,000 units 1 tablet every other week #3 patient with osteopenia but high-risk for fracture based on Frax analysis last year for which she was started on Fosamax 70 mg every weekly which she was instructed to continue. #4 for episodes of vulvar pruritus and eventually have underlying yeast F prescribed her Diflucan 150 mg to take when necessary #5 for her constipation she was represcribed Linzess 145 micrograms she is to take 1 every other day instead of every day because it causes her to have loose stool #6 for vaginal atrophy she is given prescription refill for her vaginal estrogen which she applies twice a week.

## 2015-05-03 ENCOUNTER — Encounter: Payer: Self-pay | Admitting: Gynecology

## 2015-05-03 LAB — VITAMIN D 25 HYDROXY (VIT D DEFICIENCY, FRACTURES): Vit D, 25-Hydroxy: 22 ng/mL — ABNORMAL LOW (ref 30–100)

## 2015-05-07 ENCOUNTER — Other Ambulatory Visit: Payer: Self-pay | Admitting: Gynecology

## 2015-05-07 DIAGNOSIS — E559 Vitamin D deficiency, unspecified: Secondary | ICD-10-CM

## 2015-05-09 ENCOUNTER — Other Ambulatory Visit: Payer: Medicare Other

## 2015-05-09 DIAGNOSIS — E559 Vitamin D deficiency, unspecified: Secondary | ICD-10-CM

## 2015-05-10 LAB — VITAMIN D 25 HYDROXY (VIT D DEFICIENCY, FRACTURES): Vit D, 25-Hydroxy: 27 ng/mL — ABNORMAL LOW (ref 30–100)

## 2015-05-13 ENCOUNTER — Other Ambulatory Visit: Payer: Self-pay

## 2015-05-14 ENCOUNTER — Other Ambulatory Visit: Payer: Medicare Other

## 2015-05-14 DIAGNOSIS — E559 Vitamin D deficiency, unspecified: Secondary | ICD-10-CM | POA: Diagnosis not present

## 2015-05-15 LAB — GLIA (IGA/G) + TTG IGA
Gliadin IgA: 5 Units (ref <20)
Gliadin IgG: 15 Units (ref <20)
Tissue Transglutaminase Ab, IgA: 1 U/mL (ref <4)

## 2015-05-16 ENCOUNTER — Other Ambulatory Visit: Payer: Self-pay | Admitting: Gastroenterology

## 2015-05-16 DIAGNOSIS — K3189 Other diseases of stomach and duodenum: Secondary | ICD-10-CM | POA: Diagnosis not present

## 2015-05-16 DIAGNOSIS — D123 Benign neoplasm of transverse colon: Secondary | ICD-10-CM | POA: Diagnosis not present

## 2015-05-16 DIAGNOSIS — Z09 Encounter for follow-up examination after completed treatment for conditions other than malignant neoplasm: Secondary | ICD-10-CM | POA: Diagnosis not present

## 2015-05-16 DIAGNOSIS — K5730 Diverticulosis of large intestine without perforation or abscess without bleeding: Secondary | ICD-10-CM | POA: Diagnosis not present

## 2015-05-16 DIAGNOSIS — Q399 Congenital malformation of esophagus, unspecified: Secondary | ICD-10-CM | POA: Diagnosis not present

## 2015-05-16 DIAGNOSIS — D125 Benign neoplasm of sigmoid colon: Secondary | ICD-10-CM | POA: Diagnosis not present

## 2015-05-16 DIAGNOSIS — K6389 Other specified diseases of intestine: Secondary | ICD-10-CM | POA: Diagnosis not present

## 2015-05-16 DIAGNOSIS — D126 Benign neoplasm of colon, unspecified: Secondary | ICD-10-CM | POA: Diagnosis not present

## 2015-05-16 DIAGNOSIS — Z86010 Personal history of colonic polyps: Secondary | ICD-10-CM | POA: Diagnosis not present

## 2015-05-16 DIAGNOSIS — R1310 Dysphagia, unspecified: Secondary | ICD-10-CM | POA: Diagnosis not present

## 2015-05-16 DIAGNOSIS — K449 Diaphragmatic hernia without obstruction or gangrene: Secondary | ICD-10-CM | POA: Diagnosis not present

## 2015-05-28 ENCOUNTER — Telehealth: Payer: Self-pay | Admitting: *Deleted

## 2015-05-28 NOTE — Telephone Encounter (Signed)
Replens OTC

## 2015-05-28 NOTE — Telephone Encounter (Signed)
Pt is taking estradiol 0.02% formulated cream, has ran out of cream and it is too early to get a refill as insurance will not pay. Pt asked if you know of anything she can use OTC until it is time for next refill? Please advise

## 2015-05-28 NOTE — Telephone Encounter (Signed)
Left the below on voicemail

## 2015-06-14 DIAGNOSIS — N3000 Acute cystitis without hematuria: Secondary | ICD-10-CM | POA: Diagnosis not present

## 2015-07-08 ENCOUNTER — Ambulatory Visit (INDEPENDENT_AMBULATORY_CARE_PROVIDER_SITE_OTHER): Payer: Medicare Other | Admitting: Gynecology

## 2015-07-08 ENCOUNTER — Encounter: Payer: Self-pay | Admitting: Gynecology

## 2015-07-08 VITALS — BP 136/70

## 2015-07-08 DIAGNOSIS — Z4689 Encounter for fitting and adjustment of other specified devices: Secondary | ICD-10-CM | POA: Diagnosis not present

## 2015-07-08 DIAGNOSIS — N390 Urinary tract infection, site not specified: Secondary | ICD-10-CM

## 2015-07-08 DIAGNOSIS — R350 Frequency of micturition: Secondary | ICD-10-CM | POA: Diagnosis not present

## 2015-07-08 DIAGNOSIS — R300 Dysuria: Secondary | ICD-10-CM | POA: Diagnosis not present

## 2015-07-08 DIAGNOSIS — IMO0001 Reserved for inherently not codable concepts without codable children: Secondary | ICD-10-CM

## 2015-07-08 DIAGNOSIS — N819 Female genital prolapse, unspecified: Secondary | ICD-10-CM | POA: Diagnosis not present

## 2015-07-08 LAB — URINALYSIS W MICROSCOPIC + REFLEX CULTURE
Bilirubin Urine: NEGATIVE
Casts: NONE SEEN
Crystals: NONE SEEN
Glucose, UA: NEGATIVE mg/dL
Ketones, ur: NEGATIVE mg/dL
Nitrite: NEGATIVE
Protein, ur: 100 mg/dL — AB
Specific Gravity, Urine: 1.015 (ref 1.005–1.030)
Urobilinogen, UA: 0.2 mg/dL (ref 0.0–1.0)
pH: 6.5 (ref 5.0–8.0)

## 2015-07-08 MED ORDER — NITROFURANTOIN MONOHYD MACRO 100 MG PO CAPS: 100.0000 mg | ORAL_CAPSULE | Freq: Two times a day (BID) | ORAL | Status: DC

## 2015-07-08 MED ORDER — PHENAZOPYRIDINE HCL 200 MG PO TABS: 200.0000 mg | ORAL_TABLET | Freq: Three times a day (TID) | ORAL | Status: DC | PRN

## 2015-07-08 MED ORDER — FLUCONAZOLE 150 MG PO TABS: 150.0000 mg | ORAL_TABLET | Freq: Once | ORAL | Status: DC

## 2015-07-08 NOTE — Progress Notes (Signed)
   Patient presented to the office stating that in mid June she was in [place] and was treated for a suspected urinary tract infection whereby patient was told she had a lot of white blood cells in her urine and was started on Cipro 500 mg twice a day for 10 days. They would not take out her pessary for cleaning and as which she's here today as well as now having symptoms of dysuria, frequency but no back pain no fever no chills no nausea no vomiting.  Patient with history of uterine prolapse along with cystocele and rectocele and history of urinary incontinence. Patient with multiple comorbidities not surgical candidate see past medical history list. The patient has had history of lung cancer and lobectomy. She also has history of COPD. The patient is applying vaginal estrogen twice a week and has been doing well.She has done well with a ring pessary.   Exam: Blood pressure 136/70 Gen. appearance well-developed female with the above-mentioned complaint of dysuria and frequency and suprapubic pressure  Bartholin, Urethra, Skene Glands: Within normal limits  Vagina: No gross lesions or discharge, atrophic changes, second-degree cystocele and second-degree rectocele and first degree uterine descensus  Cervix: No gross lesions or discharge  Uterus axial, normal size, shape and consistency, non-tender and mobile  Adnexa Without masses or tenderness  The pessary was cleaned and replaced.  Urinalysis: Too numerous to count white blood cell, too numerous to count red blood cells, many bacteria  Assessment/plan: #41  81 year old female with multiple comorbidities and pelvic organ prolapse doing well with ring pessary. Patient will continue to apply vaginal estrogen twice a week and return to the office in 3 months for her annual gynecological exam in addition to the pessary maintenance. #2 urinary tract infection will be treated with Macrobid one by mouth twice a day for 5 days. For dysuria she will  be prescribed Pyridium 200 mg one by mouth 3 times a day for 3 days.

## 2015-07-08 NOTE — Addendum Note (Signed)
Addended by: Thurnell Garbe A on: 07/08/2015 03:40 PM   Modules accepted: Orders

## 2015-07-08 NOTE — Patient Instructions (Signed)
Phenazopyridine tablets What is this medicine? PHENAZOPYRIDINE (fen az oh PEER i deen) is a pain reliever. It is used to stop the pain, burning, or discomfort caused by infection or irritation of the urinary tract. This medicine is not an antibiotic. It will not cure a urinary tract infection. This medicine may be used for other purposes; ask your health care provider or pharmacist if you have questions. COMMON BRAND NAME(S): AZO, Azo-100, Azo-Gesic, Azo-Septic, Azo-Standard, Phenazo, Prodium, Pyridium, Urinary Analgesic, Uristat What should I tell my health care provider before I take this medicine? They need to know if you have any of these conditions: -glucose-6-phosphate dehydrogenase (G6PD) deficiency -kidney disease -an unusual or allergic reaction to phenazopyridine, other medicines, foods, dyes, or preservatives -pregnant or trying to get pregnant -breast-feeding How should I use this medicine? Take this medicine by mouth with a glass of water. Follow the directions on the prescription label. Take after meals. Take your doses at regular intervals. Do not take your medicine more often than directed. Do not skip doses or stop your medicine early even if you feel better. Do not stop taking except on your doctor's advice. Talk to your pediatrician regarding the use of this medicine in children. Special care may be needed. Overdosage: If you think you have taken too much of this medicine contact a poison control center or emergency room at once. NOTE: This medicine is only for you. Do not share this medicine with others. What if I miss a dose? If you miss a dose, take it as soon as you can. If it is almost time for your next dose, take only that dose. Do not take double or extra doses. What may interact with this medicine? Interactions are not expected. This list may not describe all possible interactions. Give your health care provider a list of all the medicines, herbs, non-prescription  drugs, or dietary supplements you use. Also tell them if you smoke, drink alcohol, or use illegal drugs. Some items may interact with your medicine. What should I watch for while using this medicine? Tell your doctor or health care professional if your symptoms do not improve or if they get worse. This medicine colors body fluids red. This effect is harmless and will go away after you are done taking the medicine. It will change urine to an dark orange or red color. The red color may stain clothing. Soft contact lenses may become permanently stained. It is best not to wear soft contact lenses while taking this medicine. If you are diabetic you may get a false positive result for sugar in your urine. Talk to your health care provider. What side effects may I notice from receiving this medicine? Side effects that you should report to your doctor or health care professional as soon as possible: -allergic reactions like skin rash, itching or hives, swelling of the face, lips, or tongue -blue or purple color of the skin -difficulty breathing -fever -less urine -unusual bleeding, bruising -unusual tired, weak -vomiting -yellowing of the eyes or skin Side effects that usually do not require medical attention (report to your doctor or health care professional if they continue or are bothersome): -dark urine -headache -stomach upset This list may not describe all possible side effects. Call your doctor for medical advice about side effects. You may report side effects to FDA at 1-800-FDA-1088. Where should I keep my medicine? Keep out of the reach of children. Store at room temperature between 15 and 30 degrees C (59 and 86  degrees F). Protect from light and moisture. Throw away any unused medicine after the expiration date. NOTE: This sheet is a summary. It may not cover all possible information. If you have questions about this medicine, talk to your doctor, pharmacist, or health care provider.   2015, Elsevier/Gold Standard. (2008-07-12 11:04:07) Nitrofurantoin tablets or capsules What is this medicine? NITROFURANTOIN (nye troe fyoor AN toyn) is an antibiotic. It is used to treat urinary tract infections. This medicine may be used for other purposes; ask your health care provider or pharmacist if you have questions. COMMON BRAND NAME(S): Macrobid, Macrodantin, Urotoin What should I tell my health care provider before I take this medicine? They need to know if you have any of these conditions: -anemia -diabetes -glucose-6-phosphate dehydrogenase deficiency -kidney disease -liver disease -lung disease -other chronic illness -an unusual or allergic reaction to nitrofurantoin, other antibiotics, other medicines, foods, dyes or preservatives -pregnant or trying to get pregnant -breast-feeding How should I use this medicine? Take this medicine by mouth with a glass of water. Follow the directions on the prescription label. Take this medicine with food or milk. Take your doses at regular intervals. Do not take your medicine more often than directed. Do not stop taking except on your doctor's advice. Talk to your pediatrician regarding the use of this medicine in children. While this drug may be prescribed for selected conditions, precautions do apply. Overdosage: If you think you have taken too much of this medicine contact a poison control center or emergency room at once. NOTE: This medicine is only for you. Do not share this medicine with others. What if I miss a dose? If you miss a dose, take it as soon as you can. If it is almost time for your next dose, take only that dose. Do not take double or extra doses. What may interact with this medicine? -antacids containing magnesium trisilicate -probenecid -quinolone antibiotics like ciprofloxacin, lomefloxacin, norfloxacin and ofloxacin -sulfinpyrazone This list may not describe all possible interactions. Give your health care  provider a list of all the medicines, herbs, non-prescription drugs, or dietary supplements you use. Also tell them if you smoke, drink alcohol, or use illegal drugs. Some items may interact with your medicine. What should I watch for while using this medicine? Tell your doctor or health care professional if your symptoms do not improve or if you get new symptoms. Drink several glasses of water a day. If you are taking this medicine for a long time, visit your doctor for regular checks on your progress. If you are diabetic, you may get a false positive result for sugar in your urine with certain brands of urine tests. Check with your doctor. What side effects may I notice from receiving this medicine? Side effects that you should report to your doctor or health care professional as soon as possible: -allergic reactions like skin rash or hives, swelling of the face, lips, or tongue -chest pain -cough -difficulty breathing -dizziness, drowsiness -fever or infection -joint aches or pains -pale or blue-tinted skin -redness, blistering, peeling or loosening of the skin, including inside the mouth -tingling, burning, pain, or numbness in hands or feet -unusual bleeding or bruising -unusually weak or tired -yellowing of eyes or skin Side effects that usually do not require medical attention (report to your doctor or health care professional if they continue or are bothersome): -dark urine -diarrhea -headache -loss of appetite -nausea or vomiting -temporary hair loss This list may not describe all possible side effects. Call  your doctor for medical advice about side effects. You may report side effects to FDA at 1-800-FDA-1088. Where should I keep my medicine? Keep out of the reach of children. Store at room temperature between 15 and 30 degrees C (59 and 86 degrees F). Protect from light. Throw away any unused medicine after the expiration date. NOTE: This sheet is a summary. It may not cover  all possible information. If you have questions about this medicine, talk to your doctor, pharmacist, or health care provider.  2015, Elsevier/Gold Standard. (2008-07-04 15:56:47) Urinary Tract Infection Urinary tract infections (UTIs) can develop anywhere along your urinary tract. Your urinary tract is your body's drainage system for removing wastes and extra water. Your urinary tract includes two kidneys, two ureters, a bladder, and a urethra. Your kidneys are a pair of bean-shaped organs. Each kidney is about the size of your fist. They are located below your ribs, one on each side of your spine. CAUSES Infections are caused by microbes, which are microscopic organisms, including fungi, viruses, and bacteria. These organisms are so small that they can only be seen through a microscope. Bacteria are the microbes that most commonly cause UTIs. SYMPTOMS  Symptoms of UTIs may vary by age and gender of the patient and by the location of the infection. Symptoms in young women typically include a frequent and intense urge to urinate and a painful, burning feeling in the bladder or urethra during urination. Older women and men are more likely to be tired, shaky, and weak and have muscle aches and abdominal pain. A fever may mean the infection is in your kidneys. Other symptoms of a kidney infection include pain in your back or sides below the ribs, nausea, and vomiting. DIAGNOSIS To diagnose a UTI, your caregiver will ask you about your symptoms. Your caregiver also will ask to provide a urine sample. The urine sample will be tested for bacteria and white blood cells. White blood cells are made by your body to help fight infection. TREATMENT  Typically, UTIs can be treated with medication. Because most UTIs are caused by a bacterial infection, they usually can be treated with the use of antibiotics. The choice of antibiotic and length of treatment depend on your symptoms and the type of bacteria causing your  infection. HOME CARE INSTRUCTIONS  If you were prescribed antibiotics, take them exactly as your caregiver instructs you. Finish the medication even if you feel better after you have only taken some of the medication.  Drink enough water and fluids to keep your urine clear or pale yellow.  Avoid caffeine, tea, and carbonated beverages. They tend to irritate your bladder.  Empty your bladder often. Avoid holding urine for long periods of time.  Empty your bladder before and after sexual intercourse.  After a bowel movement, women should cleanse from front to back. Use each tissue only once. SEEK MEDICAL CARE IF:   You have back pain.  You develop a fever.  Your symptoms do not begin to resolve within 3 days. SEEK IMMEDIATE MEDICAL CARE IF:   You have severe back pain or lower abdominal pain.  You develop chills.  You have nausea or vomiting.  You have continued burning or discomfort with urination. MAKE SURE YOU:   Understand these instructions.  Will watch your condition.  Will get help right away if you are not doing well or get worse. Document Released: 09/23/2005 Document Revised: 06/14/2012 Document Reviewed: 01/22/2012 [facility] Patient Information 2015 [place], [place]. This information is  not intended to replace advice given to you by your health care provider. Make sure you discuss any questions you have with your health care provider.

## 2015-07-09 LAB — URINE CULTURE
Colony Count: NO GROWTH
Organism ID, Bacteria: NO GROWTH

## 2015-07-11 DIAGNOSIS — E039 Hypothyroidism, unspecified: Secondary | ICD-10-CM | POA: Diagnosis not present

## 2015-07-11 DIAGNOSIS — L309 Dermatitis, unspecified: Secondary | ICD-10-CM | POA: Diagnosis not present

## 2015-07-11 DIAGNOSIS — R739 Hyperglycemia, unspecified: Secondary | ICD-10-CM | POA: Diagnosis not present

## 2015-07-11 DIAGNOSIS — Z0000 Encounter for general adult medical examination without abnormal findings: Secondary | ICD-10-CM | POA: Diagnosis not present

## 2015-07-11 DIAGNOSIS — Z1389 Encounter for screening for other disorder: Secondary | ICD-10-CM | POA: Diagnosis not present

## 2015-07-11 DIAGNOSIS — J440 Chronic obstructive pulmonary disease with acute lower respiratory infection: Secondary | ICD-10-CM | POA: Diagnosis not present

## 2015-07-11 DIAGNOSIS — E780 Pure hypercholesterolemia: Secondary | ICD-10-CM | POA: Diagnosis not present

## 2015-07-11 DIAGNOSIS — Z23 Encounter for immunization: Secondary | ICD-10-CM | POA: Diagnosis not present

## 2015-07-22 ENCOUNTER — Telehealth: Payer: Self-pay | Admitting: *Deleted

## 2015-07-22 DIAGNOSIS — R300 Dysuria: Secondary | ICD-10-CM

## 2015-07-22 NOTE — Telephone Encounter (Signed)
It could be the pessary whereby we may need to look at the possibility of changing to a smaller size. She can come by [facility] a urine sample as well for culture

## 2015-07-22 NOTE — Telephone Encounter (Signed)
Pt called to follow up from [place] on 07/08/15, has taken all prescribed medication. Pt said while on vacation last week she noticed pressure feeling in bladder, has had small amounts burning with urination that comes and goes. She took Azo as well, still using vaginal estrogen twice a week as directed. She asked for your thoughts, states she feels a lot better after taking Rx, but the pressure feeling is still there. Please advise

## 2015-07-22 NOTE — Telephone Encounter (Signed)
Pt informed with the below note, order placed for urine she will come tomorrow

## 2015-07-23 ENCOUNTER — Other Ambulatory Visit: Payer: Medicare Other

## 2015-07-23 DIAGNOSIS — R300 Dysuria: Secondary | ICD-10-CM

## 2015-07-23 DIAGNOSIS — Z79899 Other long term (current) drug therapy: Secondary | ICD-10-CM | POA: Diagnosis not present

## 2015-07-23 DIAGNOSIS — J449 Chronic obstructive pulmonary disease, unspecified: Secondary | ICD-10-CM | POA: Diagnosis not present

## 2015-07-23 DIAGNOSIS — M0579 Rheumatoid arthritis with rheumatoid factor of multiple sites without organ or systems involvement: Secondary | ICD-10-CM | POA: Diagnosis not present

## 2015-07-23 DIAGNOSIS — M2550 Pain in unspecified joint: Secondary | ICD-10-CM | POA: Diagnosis not present

## 2015-07-24 LAB — URINALYSIS W MICROSCOPIC + REFLEX CULTURE
Bilirubin Urine: NEGATIVE
Casts: NONE SEEN [LPF]
Crystals: NONE SEEN [HPF]
Glucose, UA: NEGATIVE
Ketones, ur: NEGATIVE
Nitrite: NEGATIVE
Specific Gravity, Urine: 1.009 (ref 1.001–1.035)
Yeast: NONE SEEN [HPF]
pH: 6 (ref 5.0–8.0)

## 2015-07-25 ENCOUNTER — Telehealth: Payer: Self-pay

## 2015-07-25 NOTE — Telephone Encounter (Signed)
Patient called for results of u/a. I was not seeing anything reporting in the system so I asked Elmyra Ricks.  She printed it out and I have put it in your box. She said sensitivities are still pending.  (Patient had been treated a few weeks ago and had called with some symptoms persisting.)

## 2015-07-26 LAB — URINE CULTURE: Colony Count: >=100000

## 2015-07-26 NOTE — Telephone Encounter (Signed)
Lisa Vega in lab called and spoke with Catina at main lab. She said they do not have the sensitivies yet. Lisa Vega will check again this afternoon.

## 2015-07-26 NOTE — Progress Notes (Signed)
Please call in prescription for Septra DS one by mouth twice a day for 7 days while we wait for her sensitivity. She had been on Macrobid early this month and we will try a different antibody because of her symptoms.

## 2015-07-26 NOTE — Telephone Encounter (Signed)
This patient has been: For the results of her culture and sensitivity. The report was greater than 100,000 colony count. It has been 72 hours since her urine culture was obtained. I need a sensitivity panel report

## 2015-07-29 ENCOUNTER — Telehealth: Payer: Self-pay | Admitting: Pulmonary Disease

## 2015-07-29 MED ORDER — BUDESONIDE-FORMOTEROL FUMARATE 160-4.5 MCG/ACT IN AERO: INHALATION_SPRAY | RESPIRATORY_TRACT | Status: DC

## 2015-07-29 NOTE — Telephone Encounter (Signed)
Spoke with pt. Needs refill on Symbicort. This has been sent in under BQ as she has an upcoming appointment with him. Nothing further was needed.

## 2015-07-30 ENCOUNTER — Other Ambulatory Visit: Payer: Self-pay | Admitting: *Deleted

## 2015-07-30 MED ORDER — CIPROFLOXACIN HCL 250 MG PO TABS: 250.0000 mg | ORAL_TABLET | Freq: Two times a day (BID) | ORAL | Status: DC

## 2015-07-31 DIAGNOSIS — N8110 Cystocele, unspecified: Secondary | ICD-10-CM | POA: Diagnosis not present

## 2015-07-31 DIAGNOSIS — N3020 Other chronic cystitis without hematuria: Secondary | ICD-10-CM | POA: Diagnosis not present

## 2015-07-31 DIAGNOSIS — N393 Stress incontinence (female) (male): Secondary | ICD-10-CM | POA: Diagnosis not present

## 2015-07-31 DIAGNOSIS — R351 Nocturia: Secondary | ICD-10-CM | POA: Diagnosis not present

## 2015-07-31 DIAGNOSIS — R312 Other microscopic hematuria: Secondary | ICD-10-CM | POA: Diagnosis not present

## 2015-08-06 ENCOUNTER — Encounter: Payer: Medicare Other | Admitting: Gynecology

## 2015-08-23 DIAGNOSIS — N3020 Other chronic cystitis without hematuria: Secondary | ICD-10-CM | POA: Diagnosis not present

## 2015-08-23 DIAGNOSIS — R351 Nocturia: Secondary | ICD-10-CM | POA: Diagnosis not present

## 2015-08-23 DIAGNOSIS — N393 Stress incontinence (female) (male): Secondary | ICD-10-CM | POA: Diagnosis not present

## 2015-08-23 DIAGNOSIS — N8110 Cystocele, unspecified: Secondary | ICD-10-CM | POA: Diagnosis not present

## 2015-08-27 DIAGNOSIS — M1712 Unilateral primary osteoarthritis, left knee: Secondary | ICD-10-CM | POA: Diagnosis not present

## 2015-08-27 DIAGNOSIS — M25562 Pain in left knee: Secondary | ICD-10-CM | POA: Diagnosis not present

## 2015-08-28 DIAGNOSIS — H02401 Unspecified ptosis of right eyelid: Secondary | ICD-10-CM | POA: Diagnosis not present

## 2015-08-28 DIAGNOSIS — H1851 Endothelial corneal dystrophy: Secondary | ICD-10-CM | POA: Diagnosis not present

## 2015-08-28 DIAGNOSIS — H109 Unspecified conjunctivitis: Secondary | ICD-10-CM | POA: Diagnosis not present

## 2015-08-28 DIAGNOSIS — Z961 Presence of intraocular lens: Secondary | ICD-10-CM | POA: Diagnosis not present

## 2015-09-13 ENCOUNTER — Ambulatory Visit: Payer: Medicare Other | Admitting: Pulmonary Disease

## 2015-09-16 ENCOUNTER — Ambulatory Visit: Payer: Medicare Other | Admitting: Pulmonary Disease

## 2015-10-09 ENCOUNTER — Ambulatory Visit (INDEPENDENT_AMBULATORY_CARE_PROVIDER_SITE_OTHER): Payer: Medicare Other | Admitting: Gynecology

## 2015-10-09 ENCOUNTER — Encounter: Payer: Self-pay | Admitting: Gynecology

## 2015-10-09 VITALS — BP 134/80 | Ht 62.0 in | Wt 161.0 lb

## 2015-10-09 DIAGNOSIS — N814 Uterovaginal prolapse, unspecified: Secondary | ICD-10-CM

## 2015-10-09 DIAGNOSIS — Z01419 Encounter for gynecological examination (general) (routine) without abnormal findings: Secondary | ICD-10-CM | POA: Diagnosis not present

## 2015-10-09 DIAGNOSIS — N952 Postmenopausal atrophic vaginitis: Secondary | ICD-10-CM | POA: Diagnosis not present

## 2015-10-09 DIAGNOSIS — IMO0002 Reserved for concepts with insufficient information to code with codable children: Secondary | ICD-10-CM

## 2015-10-09 DIAGNOSIS — N8110 Cystocele, unspecified: Secondary | ICD-10-CM | POA: Diagnosis not present

## 2015-10-09 DIAGNOSIS — R413 Other amnesia: Secondary | ICD-10-CM | POA: Diagnosis not present

## 2015-10-09 DIAGNOSIS — Z8639 Personal history of other endocrine, nutritional and metabolic disease: Secondary | ICD-10-CM | POA: Diagnosis not present

## 2015-10-09 MED ORDER — ALENDRONATE SODIUM 70 MG PO TABS: 70.0000 mg | ORAL_TABLET | ORAL | Status: DC

## 2015-10-09 NOTE — Progress Notes (Signed)
Lisa Vega February 13, 1932 [id number]   History:    81 y.o.  for annual gyn exam as well as for her pessary maintenance.Patient has a history of mild uterine prolapse along with cystocele rectocele and had had urinary incontinence. The patient has had history of lung cancer and lobectomy. She also has history of COPD. patient had colonoscopy and EGD by her gastroenterologist this year and she states that benign polyps were removed. Patient was seen by urologist recently as a result of recurrent urinary tract infections and she stated that a CT scan was done and no kidney stones were noted but that she is currently on Macrobid 100 mg daily for prophylaxis. Patient's PCP has been doing her blood work.  Review of her record indicated that last year she was started on alendronate (Fosamax) 70 mg every weekly as a result of her bone density study which had demonstrated that her lowest T score was at the left femoral neck with a value -2.2. Her FRAX analysis placed her in a high risk category for fracture as follows: 10 year fracture risk of the hip 20% (threshold is 3%) risk of any major osteoporotic fractures 37% over the course of the next 10 years (threshold 20%).  Patient has several risk factors for fracture: 1. Postmenopausal 2. Vitamin D deficiency 3. 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 Past history of smoking history of COPD 5. Rheumatoid arthritis 6. Chronic steroid usage 7. History of non-small cell cancer of the lung  For this reason the patient was started on the antiresorptive agent.  Past medical history,surgical history, family history and social history were all reviewed and documented in the EPIC chart.  Gynecologic History No LMP recorded. Patient is postmenopausal. Contraception: post menopausal status Last Pap: 2005. Results were: normal Last mammogram: 2001. Results were: normal  Obstetric History OB History  Gravida Para Term Preterm AB SAB TAB  Ectopic Multiple Living  '5 3   2 1  1  3    '$ # Outcome Date GA Lbr Len/2nd Weight Sex Delivery Anes PTL Lv  5 Ectopic           4 SAB           3 Para           2 Para           1 Para                ROS: A ROS was performed and pertinent positives and negatives are included in the history.  GENERAL: No fevers or chills. HEENT: No change in vision, no earache, sore throat or sinus congestion. NECK: No pain or stiffness. CARDIOVASCULAR: No chest pain or pressure. No palpitations. PULMONARY: No shortness of breath, cough or wheeze. GASTROINTESTINAL: No abdominal pain, nausea, vomiting or diarrhea, melena or bright red blood per rectum. GENITOURINARY: No urinary frequency, urgency, hesitancy or dysuria. MUSCULOSKELETAL: No joint or muscle pain, no back pain, no recent trauma. DERMATOLOGIC: No rash, no itching, no lesions. ENDOCRINE: No polyuria, polydipsia, no heat or cold intolerance. No recent change in weight. HEMATOLOGICAL: No anemia or easy bruising or bleeding. NEUROLOGIC: No headache, seizures, numbness, tingling or weakness. PSYCHIATRIC: No depression, no loss of interest in normal activity or change in sleep pattern.     Exam: chaperone present  BP 134/80 mmHg  Ht '5\' 2"'$  (1.575 m)  Wt 161 lb (73.029 kg)  BMI 29.44 kg/m2  Body mass index is 29.44 kg/(m^2).  General  appearance : Well developed well nourished female. No acute distress HEENT: Eyes: no retinal hemorrhage or exudates,  Neck supple, trachea midline, no carotid bruits, no thyroidmegaly Lungs: Clear to auscultation, no rhonchi or wheezes, or rib retractions  Heart: Regular rate and rhythm, no murmurs or gallops Breast:Examined in sitting and supine position were symmetrical in appearance, no palpable masses or tenderness,  no skin retraction, no nipple inversion, no nipple discharge, no skin discoloration, no axillary or supraclavicular lymphadenopathy Abdomen: no palpable masses or tenderness, no rebound or  guarding Extremities: no edema or skin discoloration or tenderness  Pelvic:  Bartholin, Urethra, Skene Glands: Within normal limits             Vagina: Atrophic changes, second-degree cystocele, second-degree rectocele, first-degree uterine descensus  Cervix: No gross lesions or discharge  Uterus  normal size shape and consistency and nontender, and mobile  Adnexa  Without masses or tenderness  Anus and perineum  normal   Rectovaginal  normal sphincter tone without palpated masses or tenderness             Hemoccult PCP provides     Assessment/Plan:  81 y.o. female for annual exam  with osteopenia high-risk for fracture based on Frax analysis currently on Fosamax 70 mg every weekly. Patient doing well. Patient with past history vitamin D deficiency we will check a vitamin D level today. Her PCP has been doing all her other blood work. Her pessary was cleansed and replaced back into the vagina and due to her severe rheumatoid arthritis causing disfiguration of her hands she comes every 3 months for cleaning and reinsertion of her pessary. She applies a vaginal estrogen once a week. Patient will be referred to the neurologist for further evaluation of her self described loss. Pap smear not indicated.  Terrance Mass MD, 2:32 PM 10/09/2015

## 2015-10-10 LAB — VITAMIN D 25 HYDROXY (VIT D DEFICIENCY, FRACTURES): Vit D, 25-Hydroxy: 17 ng/mL — ABNORMAL LOW (ref 30–100)

## 2015-10-14 ENCOUNTER — Ambulatory Visit (INDEPENDENT_AMBULATORY_CARE_PROVIDER_SITE_OTHER)
Admission: RE | Admit: 2015-10-14 | Discharge: 2015-10-14 | Disposition: A | Discharge status: Home / Self Care | Payer: Medicare Other | Source: Ambulatory Visit | Attending: Pulmonary Disease | Admitting: Pulmonary Disease

## 2015-10-14 ENCOUNTER — Encounter: Payer: Self-pay | Admitting: Pulmonary Disease

## 2015-10-14 ENCOUNTER — Ambulatory Visit (INDEPENDENT_AMBULATORY_CARE_PROVIDER_SITE_OTHER): Payer: Medicare Other | Admitting: Pulmonary Disease

## 2015-10-14 VITALS — BP 108/62 | HR 97 | Ht 62.0 in | Wt 163.4 lb

## 2015-10-14 DIAGNOSIS — R490 Dysphonia: Secondary | ICD-10-CM | POA: Diagnosis not present

## 2015-10-14 DIAGNOSIS — R0602 Shortness of breath: Secondary | ICD-10-CM | POA: Diagnosis not present

## 2015-10-14 DIAGNOSIS — Z85118 Personal history of other malignant neoplasm of bronchus and lung: Secondary | ICD-10-CM

## 2015-10-14 DIAGNOSIS — R05 Cough: Secondary | ICD-10-CM | POA: Diagnosis not present

## 2015-10-14 DIAGNOSIS — J438 Other emphysema: Secondary | ICD-10-CM

## 2015-10-14 NOTE — Progress Notes (Signed)
Subjective:    Patient ID: Lisa Vega, female    DOB: [birth year], 81 y.o.   MRN: [id number]  Synopsis: Former patient of Dr. Gwenette Greet with COPD in addition history of lung cancer status post lobectomy.  Also has rheumatoid arthritis and has been on methotrexate since 1986. Dr. Gwenette Greet summarized her care and clinical situation as follows: PFT's 2013:  FEV1 1.49 (81%), ratio 60, TLC 86%, DLCO 65% (stable to better from 2007) CXR 12/2011:  No acute process. Pulmonary rehab referral 08/2012 >> never heard from them Throat soreness with spiriva Pulmonary rehab referral 02/2014 Tried on spiriva respimat >> likes handihaler better.  Right upper lobectomy 2005 for [facility] cancer. CT chest 2012 with no recurrence.   HPI Chief Complaint  Patient presents with  . Follow-up    Former [place] pt. Pulmonary Emphysema. Pt c/o dry cough and dyspnea with exertion. Pt denies wheeze/CP/tightness. Pt uses albuterol HFA maybe once/twice a week.    Lisa Vega was Dr. Janifer Adie patient for COPD and lung cancer.  She had a Right Upper lobectomy in 2005 and has done well since. She says she has been doing well recently without too much breathing difficulty.  She has rheumatoid arthritis and takes methotrexate for that (subcutaneous).   She uses symbicort twice a day and Spriva only as needed.  She uses proAir only as needed.  She says that she only needs the Spiriva for a week at a time every now and then. She is not coughing too much.  She needed prednisone and antibiotcs a year or more ago, but none since then. She had a flu shot recently.  She had a prevnar vaccine (she thinks) in July.   Past Medical History  Diagnosis Date  . Cancer ([place])     lung ca  . COPD (chronic obstructive pulmonary disease) ([place])   . Dyspnea   . Rheumatoid arthritis(714.0)   . GERD (gastroesophageal reflux disease)   . Thyroid disease   . Hypercholesteremia   . Vitamin D deficiency       Review of Systems  Constitutional: Negative  for fever, chills and fatigue.  HENT: Negative for postnasal drip, rhinorrhea and sinus pressure.   Respiratory: Positive for shortness of breath. Negative for cough and wheezing.   Cardiovascular: Negative for chest pain, palpitations and leg swelling.       Objective:   Physical Exam Filed Vitals:   10/14/15 1449  BP: 108/62  Pulse: 97  Height: '5\' 2"'$  (1.575 m)  Weight: 163 lb 6.4 oz (74.118 kg)  SpO2: 71%  O2 saturation rechecked, actually 98% on RA, O2 saturation initially documented incorrectly  RA  Gen: well appearing, no acute distress HENT: NCAT, OP clear, neck supple without masses Eyes: PERRL, EOMi Lymph: no cervical lymphadenopathy PULM: CTA B CV: RRR, no mgr, no JVD GI: BS+, soft, nontender, no hsm Derm: no rash or skin breakdown MSK: multiple MCP and PIP joint deformities Neuro: A&Ox4, CN II-XII intact, strength 5/5 in all 4 extremities Psyche: normal mood and affect  Prior records from my partner were reviewed were she was treated for COPD      Assessment & Plan:  COPD (chronic obstructive pulmonary disease) ([place]) This has been a stable interval for her as tshe has not had an exacerbation recently. She is doing well with her somewhat unusual regimen of Symbicort regularly with as needed Spiriva. Immunizations are up to date. Plan: Continue Symbicort and Spiriva  F/u 6 months  CARCINOMA, LUNG, NONSMALL CELL  There has been no evidence of recurrence.  Plan Repeat CXR today  Hoarseness This is been a persistent problem for her. It may be related to her inhalers or acid reflux but considering the fact that she's a former smoker with prolonged hoarseness I would like for her to see ear nose and throat. In the meantime do recommend that she take Pepcid in the evenings.     Current outpatient prescriptions:  .  albuterol (PROAIR HFA) 108 (90 BASE) MCG/ACT inhaler, Inhale 2 puffs into the lungs every 4 (four) hours as needed for wheezing or shortness of  breath., Disp: , Rfl:  .  alendronate (FOSAMAX) 70 MG tablet, Take 1 tablet (70 mg total) by mouth every 7 (seven) days. Take with a full glass of water on an empty stomach., Disp: 4 tablet, Rfl: 11 .  budesonide-formoterol (SYMBICORT) 160-4.5 MCG/ACT inhaler, USE 2 INHALATIONS ORALLY   INTO THE LUNGS TWO TIMES A DAY, Disp: 30.6 g, Rfl: 0 .  diazepam (VALIUM) 5 MG tablet, Take 5 mg by mouth daily as needed for anxiety. , Disp: , Rfl:  .  folic acid (FOLVITE) 1 MG tablet, Take 1 mg by mouth daily., Disp: , Rfl:  .  levothyroxine (SYNTHROID, LEVOTHROID) 88 MCG tablet, Take 88 mcg by mouth daily., Disp: , Rfl:  .  loratadine (CLARITIN) 10 MG tablet, Take 10 mg by mouth daily., Disp: , Rfl:  .  methotrexate 25 MG/ML SOLN, Inject 8.08 mg into the skin once a week. , Disp: , Rfl:  .  NONFORMULARY OR COMPOUNDED ITEM, Estradiol .02% 1 ML Prefilled Applicator Sig: apply vaginally twice a week #90 Day Supply with 4 refills, Disp: 1 each, Rfl: 4 .  nystatin-triamcinolone (MYCOLOG II) cream, Apply 1 application topically 3 (three) times daily., Disp: 30 g, Rfl: 2 .  omeprazole (PRILOSEC) 20 MG capsule, Take 20 mg by mouth 2 (two) times daily., Disp: , Rfl:  .  sertraline (ZOLOFT) 50 MG tablet, Take 25 mg by mouth daily., Disp: , Rfl:  .  tiotropium (SPIRIVA) 18 MCG inhalation capsule, Place 18 mcg into inhaler and inhale daily., Disp: , Rfl:  .  Vitamin D, Ergocalciferol, (DRISDOL) 50000 UNITS CAPS capsule, Take one tablet every other week, Disp: 6 capsule, Rfl: 4

## 2015-10-14 NOTE — Assessment & Plan Note (Signed)
There has been no evidence of recurrence.  Plan Repeat CXR today

## 2015-10-14 NOTE — Assessment & Plan Note (Signed)
This has been a stable interval for her as tshe has not had an exacerbation recently. She is doing well with her somewhat unusual regimen of Symbicort regularly with as needed Spiriva. Immunizations are up to date. Plan: Continue Symbicort and Spiriva  F/u 6 months

## 2015-10-14 NOTE — Assessment & Plan Note (Signed)
This is been a persistent problem for her. It may be related to her inhalers or acid reflux but considering the fact that she's a former smoker with prolonged hoarseness I would like for her to see ear nose and throat. In the meantime do recommend that she take Pepcid in the evenings.

## 2015-10-16 DIAGNOSIS — R490 Dysphonia: Secondary | ICD-10-CM | POA: Diagnosis not present

## 2015-10-16 DIAGNOSIS — R251 Tremor, unspecified: Secondary | ICD-10-CM | POA: Diagnosis not present

## 2015-10-16 DIAGNOSIS — H8111 Benign paroxysmal vertigo, right ear: Secondary | ICD-10-CM | POA: Diagnosis not present

## 2015-10-17 ENCOUNTER — Other Ambulatory Visit: Payer: Self-pay | Admitting: Gynecology

## 2015-10-17 MED ORDER — VITAMIN D (ERGOCALCIFEROL) 1.25 MG (50000 UNIT) PO CAPS: 50000.0000 [IU] | ORAL_CAPSULE | ORAL | Status: DC

## 2015-10-23 DIAGNOSIS — D8989 Other specified disorders involving the immune mechanism, not elsewhere classified: Secondary | ICD-10-CM | POA: Diagnosis not present

## 2015-10-23 DIAGNOSIS — Z79899 Other long term (current) drug therapy: Secondary | ICD-10-CM | POA: Diagnosis not present

## 2015-10-23 DIAGNOSIS — M0579 Rheumatoid arthritis with rheumatoid factor of multiple sites without organ or systems involvement: Secondary | ICD-10-CM | POA: Diagnosis not present

## 2015-10-28 ENCOUNTER — Ambulatory Visit (INDEPENDENT_AMBULATORY_CARE_PROVIDER_SITE_OTHER): Payer: Medicare Other | Admitting: Neurology

## 2015-10-28 ENCOUNTER — Encounter: Payer: Self-pay | Admitting: Neurology

## 2015-10-28 VITALS — BP 146/85 | HR 68 | Ht 64.0 in | Wt 162.0 lb

## 2015-10-28 DIAGNOSIS — R413 Other amnesia: Secondary | ICD-10-CM

## 2015-10-28 DIAGNOSIS — E538 Deficiency of other specified B group vitamins: Secondary | ICD-10-CM

## 2015-10-28 DIAGNOSIS — R251 Tremor, unspecified: Secondary | ICD-10-CM | POA: Diagnosis not present

## 2015-10-28 DIAGNOSIS — R490 Dysphonia: Secondary | ICD-10-CM | POA: Diagnosis not present

## 2015-10-28 HISTORY — DX: Tremor, unspecified: R25.1

## 2015-10-28 HISTORY — DX: Other amnesia: R41.3

## 2015-10-28 NOTE — Patient Instructions (Addendum)
   We will get MRI of the brain and some blood work today to evaluate the memory.   Tremor A tremor is trembling or shaking that you cannot control. Most tremors affect the hands or arms. Tremors can also affect the head, vocal cords, face, and other parts of the body. There are many types of tremors. Common types include:   Essential tremor. These usually occur in people over the age of 69. It may run in families and can happen in otherwise healthy people.   Resting tremor. These occur when the muscles are at rest, such as when your hands are resting in your lap. People with Parkinson disease often have resting tremors.   Postural tremor. These occur when you try to hold a pose, such as keeping your hands outstretched.   Kinetic tremor. These occur during purposeful movement, such as trying to touch a finger to your nose.   Task-specific tremor. These may occur when you perform tasks such as handwriting, speaking, or standing.   Psychogenic tremor. These dramatically lessen or disappear when you are distracted. They can happen in people of all ages.  Some types of tremors have no known cause. Tremors can also be a symptom of nervous system problems (neurological disorders) that may occur with aging. Some tremors go away with treatment while others do not.  HOME CARE INSTRUCTIONS Watch your tremor for any changes. The following actions may help to lessen any discomfort you are feeling:  Take medicines only as directed by your health care provider.   Limit alcohol intake to no more than 1 drink per day for nonpregnant women and 2 drinks per day for men. One drink equals 12 oz of beer, 5 oz of wine, or 1 oz of hard liquor.  Do not use any tobacco products, including cigarettes, chewing tobacco, or electronic cigarettes. If you need help quitting, ask your health care provider.   Avoid extreme heat or cold.   Limit the amount of caffeine you consumeas directed by your health  care provider.   Try to get 8 hours of sleep each night.  Find ways to manage your stress, such as meditation or yoga.  Keep all follow-up visits as directed by your health care provider. This is important. SEEK MEDICAL CARE IF:  You start having a tremor after starting a new medicine.  You have tremor with other symptoms such as:  Numbness.  Tingling.  Pain.  Weakness.  Your tremor gets worse.  Your tremor interferes with your day-to-day life.   This information is not intended to replace advice given to you by your health care provider. Make sure you discuss any questions you have with your health care provider.   Document Released: 12/04/2002 Document Revised: 01/04/2015 Document Reviewed: 06/11/2014 Elsevier Interactive Patient Education [employer].

## 2015-10-28 NOTE — Progress Notes (Signed)
Reason for visit: Tremor  Referring physician: Dr. Lennie Odor is a 81 y.o. female  History of present illness:  Lisa Vega is an 81 year old right-handed white female with a history of a tremor affecting the jaw over the last 12-18 months. The patient apparently was placed on propranolol for the tremor, but she could not tolerate the drug. She indicates that the tremor has persisted involving the jaw, and generally is worse when she is upset or nervous about something. She apparently has had an evaluation through ENT for problems with hoarseness of the voice, she was found to have a tremor involving the vocal cords, and she is sent to this office for further evaluation. The patient indicates that the hoarseness of the voice is generally worse in the evenings, and it does not parallel severity of the jaw tremor. The patient reports some discomfort in the throat, as if she has to clear her throat, but she can never get up any phlegm. She occasionally will have some tremor involving the hands, but this is not often. The patient also goes on to say that she is having some issues with memory that has developed over the last 2 years, she is having difficulty misplacing things about the house, she denies any issues keeping up with medications or appointments, she denies issues with directions with driving. She will repeat herself frequently. The patient denies any focal numbness or weakness of the extremities, she denies any significant balance problems. She comes to this office for an evaluation. She does report occasional headaches.  Past Medical History  Diagnosis Date  . Cancer ([place])     lung ca  . COPD (chronic obstructive pulmonary disease) ([place])   . Dyspnea   . Rheumatoid arthritis(714.0)   . GERD (gastroesophageal reflux disease)   . Thyroid disease   . Hypercholesteremia   . Vitamin D deficiency   . 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 Memory difficulties 10/28/2015     Past Surgical History  Procedure Laterality Date  . Right lobectomy  10/2004  . Appendectomy    . Tonsillectomy and adenoidectomy    . Hemorrhoid surgery      Family History  Problem Relation Age of Onset  . Emphysema Mother   . Asthma Mother   . Lung cancer Father   . Pancreatic cancer Father   . Bone cancer Father   . Heart disease Brother     x1  . Heart disease Sister     x2  . Stroke Maternal Grandfather     Social history:  reports that she quit smoking about 10 years ago. Her smoking use included Cigarettes. She has a 20 pack-year smoking history. She has never used smokeless tobacco. She reports that she drinks alcohol. She reports that she does not use illicit drugs.  Medications:  Prior to Admission medications   Medication Sig Start Date End Date Taking? Authorizing Provider  albuterol (PROAIR HFA) 108 (90 BASE) MCG/ACT inhaler Inhale 2 puffs into the lungs every 4 (four) hours as needed for wheezing or shortness of breath.   Yes Historical Provider, MD  alendronate (FOSAMAX) 70 MG tablet Take 1 tablet (70 mg total) by mouth every 7 (seven) days. Take with a full glass of water on an empty stomach. 10/09/15  Yes Terrance Mass, MD  budesonide-formoterol (SYMBICORT) 160-4.5 MCG/ACT inhaler USE 2 INHALATIONS ORALLY   INTO THE LUNGS TWO TIMES A DAY 07/29/15  Yes Ronie Spies  McQuaid, MD  diazepam (VALIUM) 5 MG tablet Take 5 mg by mouth daily as needed for anxiety.    Yes Historical Provider, MD  folic acid (FOLVITE) 1 MG tablet Take 1 mg by mouth daily.   Yes Historical Provider, MD  levothyroxine (SYNTHROID, LEVOTHROID) 88 MCG tablet Take 88 mcg by mouth daily.   Yes Historical Provider, MD  loratadine (CLARITIN) 10 MG tablet Take 10 mg by mouth daily.   Yes Historical Provider, MD  methotrexate 25 MG/ML SOLN Inject 8.08 mg into the skin once a week.    Yes Historical Provider, MD  NONFORMULARY OR COMPOUNDED ITEM Estradiol .02% 1 ML Prefilled Applicator Sig: apply  vaginally twice a week #90 Day Supply with 4 refills 05/02/15  Yes Terrance Mass, MD  nystatin-triamcinolone [facility] II) cream Apply 1 application topically 3 (three) times daily. 01/29/15  Yes Terrance Mass, MD  omeprazole (PRILOSEC) 20 MG capsule Take 20 mg by mouth 2 (two) times daily.   Yes Historical Provider, MD  sertraline (ZOLOFT) 50 MG tablet Take 25 mg by mouth daily.   Yes Historical Provider, MD  tiotropium (SPIRIVA) 18 MCG inhalation capsule Place 18 mcg into inhaler and inhale daily.   Yes Historical Provider, MD  Vitamin D, Ergocalciferol, (DRISDOL) 50000 UNITS CAPS capsule Take 1 capsule (50,000 Units total) by mouth every 7 (seven) days. 10/17/15  Yes Terrance Mass, MD      Allergies  Allergen Reactions  . Aspirin   . Codeine     REACTION: hallucinations  . Demerol   . Meperidine Hcl     REACTION: severe GI upset    ROS:  Out of a complete 14 system review of symptoms, the patient complains only of the following symptoms, and all other reviewed systems are negative.  Waking, fatigue Difficulty swallowing Skin rash Shortness of breath Constipation Easy bruising Joint pain, joint swelling Memory loss, tremor  Blood pressure 146/85, pulse 68, height '5\' 4"'$  (1.626 m), weight 162 lb (73.483 kg).  Physical Exam  General: The patient is alert and cooperative at the time of the examination.  Eyes: Pupils are equal, round, and reactive to light. Discs are flat bilaterally.  Neck: The neck is supple, no carotid bruits are noted.  Respiratory: The respiratory examination is clear.  Cardiovascular: The cardiovascular examination reveals a regular rate and rhythm, no obvious murmurs or rubs are noted.  Skin: Extremities are without significant edema.  Neurologic Exam  Mental status: The patient is alert and oriented x 3 at the time of the examination. The patient has apparent normal recent and remote memory, with an apparently normal attention span and  concentration ability.  Cranial nerves: Facial symmetry is present. There is good sensation of the face to pinprick and soft touch bilaterally. The strength of the facial muscles and the muscles to head turning and shoulder shrug are normal bilaterally. Speech is well enunciated, no aphasia or dysarthria is noted. Extraocular movements are full. Visual fields are full. The tongue is midline, and the patient has symmetric elevation of the soft palate. No obvious hearing deficits are noted. A jaw tremor is seen.  Motor: The motor testing reveals 5 over 5 strength of all 4 extremities. Good symmetric motor tone is noted throughout.  Sensory: Sensory testing is intact to pinprick, soft touch, vibration sensation, and position sense on all 4 extremities, with exception that position sense is decreased on the right hand. No evidence of extinction is noted.  Coordination: Cerebellar testing reveals  good finger-nose-finger and heel-to-shin bilaterally.  Gait and station: Gait is normal. Tandem gait is unsteady. Romberg is negative. No drift is seen.  Reflexes: Deep tendon reflexes are symmetric and normal bilaterally. Toes are downgoing bilaterally.   Assessment/Plan:  1. Memory disturbance  2. Jaw tremor  3. Hoarseness  The vocal quality of the patient does not appear to be consistent with tremor, the patient reports hoarseness instead. The patient has a jaw tremor. The hoarseness and the jaw tremor do not parallel one another. The patient also reports some memory issues. She will be sent for blood work today, and she will have MRI evaluation of the brain. We will follow the memory issues over time. The patient may give a trial on diazepam to see if this improves her tremor and voice.  Jill Alexanders MD 10/28/2015 8:19 PM  [facility] [address] [place], [place] [address]  Phone [phone] Fax [phone]

## 2015-10-30 ENCOUNTER — Telehealth: Payer: Self-pay | Admitting: *Deleted

## 2015-10-30 LAB — RPR: RPR Ser Ql: NONREACTIVE

## 2015-10-30 LAB — VITAMIN B12: Vitamin B-12: 354 pg/mL (ref 211–946)

## 2015-10-30 LAB — COPPER, SERUM: Copper: 86 ug/dL (ref 72–166)

## 2015-10-30 NOTE — Telephone Encounter (Signed)
LMTC/fm

## 2015-10-30 NOTE — Telephone Encounter (Signed)
-----   Message from Kathrynn Ducking, MD sent at 10/30/2015  7:44 AM EDT -----  The blood work results are unremarkable. Please call the patient.  ----- Message -----    From: [facility] Lab Results In Interface    Sent: 10/29/2015   7:42 AM      To: Kathrynn Ducking, MD

## 2015-10-31 NOTE — Telephone Encounter (Signed)
I called the patient and relayed results. 

## 2015-11-01 DIAGNOSIS — M5090 Cervical disc disorder, unspecified, unspecified cervical region: Secondary | ICD-10-CM | POA: Diagnosis not present

## 2015-11-01 DIAGNOSIS — M25511 Pain in right shoulder: Secondary | ICD-10-CM | POA: Diagnosis not present

## 2015-11-01 DIAGNOSIS — M47816 Spondylosis without myelopathy or radiculopathy, lumbar region: Secondary | ICD-10-CM | POA: Diagnosis not present

## 2015-11-01 DIAGNOSIS — M069 Rheumatoid arthritis, unspecified: Secondary | ICD-10-CM | POA: Diagnosis not present

## 2015-11-06 ENCOUNTER — Encounter: Payer: Medicare Other | Admitting: Gynecology

## 2015-11-11 ENCOUNTER — Telehealth: Payer: Self-pay | Admitting: Pulmonary Disease

## 2015-11-11 NOTE — Telephone Encounter (Signed)
Patient has been taking antibiotics since 10/14/15, she is taking this medication daily.  Patient needs something for yeast infection since she is on the antibiotics.    [facility] [place] [address]  Allergies  Allergen Reactions  . Aspirin   . Codeine     REACTION: hallucinations  . Demerol   . Meperidine Hcl     REACTION: severe GI upset

## 2015-11-11 NOTE — Telephone Encounter (Signed)
Pt calling back stating that she contacted the wrong dr office and she is going to call her primary care dr to take care of this for he, she had a very nasty attitude.Lisa Vega

## 2015-11-12 NOTE — Telephone Encounter (Signed)
Called and spoke to pt. Pt stated she already received medication from her PCP and nothing further is needed. Apologized to pt that we did not respond til today. Pt verbalized understanding and denied any further questions or concerns at this time. Will sign off.

## 2016-01-06 ENCOUNTER — Telehealth: Payer: Self-pay | Admitting: Pulmonary Disease

## 2016-01-06 MED ORDER — BUDESONIDE-FORMOTEROL FUMARATE 160-4.5 MCG/ACT IN AERO: INHALATION_SPRAY | RESPIRATORY_TRACT | Status: DC

## 2016-01-06 NOTE — Telephone Encounter (Signed)
Refill request for Symbicort received from [facility] refilled. Nothing further needed.

## 2016-01-09 ENCOUNTER — Ambulatory Visit (INDEPENDENT_AMBULATORY_CARE_PROVIDER_SITE_OTHER): Payer: Medicare Other | Admitting: Gynecology

## 2016-01-09 ENCOUNTER — Encounter: Payer: Self-pay | Admitting: Gynecology

## 2016-01-09 VITALS — BP 136/70

## 2016-01-09 DIAGNOSIS — N905 Atrophy of vulva: Secondary | ICD-10-CM

## 2016-01-09 DIAGNOSIS — B373 Candidiasis of vulva and vagina: Secondary | ICD-10-CM | POA: Diagnosis not present

## 2016-01-09 DIAGNOSIS — R350 Frequency of micturition: Secondary | ICD-10-CM | POA: Diagnosis not present

## 2016-01-09 DIAGNOSIS — N762 Acute vulvitis: Secondary | ICD-10-CM | POA: Diagnosis not present

## 2016-01-09 DIAGNOSIS — Z4689 Encounter for fitting and adjustment of other specified devices: Secondary | ICD-10-CM

## 2016-01-09 DIAGNOSIS — B3731 Acute candidiasis of vulva and vagina: Secondary | ICD-10-CM

## 2016-01-09 DIAGNOSIS — N814 Uterovaginal prolapse, unspecified: Secondary | ICD-10-CM

## 2016-01-09 DIAGNOSIS — N3001 Acute cystitis with hematuria: Secondary | ICD-10-CM

## 2016-01-09 DIAGNOSIS — R300 Dysuria: Secondary | ICD-10-CM | POA: Diagnosis not present

## 2016-01-09 LAB — URINALYSIS W MICROSCOPIC + REFLEX CULTURE
Bilirubin Urine: NEGATIVE
Casts: NONE SEEN [LPF]
Crystals: NONE SEEN [HPF]
Glucose, UA: NEGATIVE
Ketones, ur: NEGATIVE
Nitrite: NEGATIVE
Protein, ur: NEGATIVE
Specific Gravity, Urine: <1.005 (ref 1.001–1.035)
pH: 5 (ref 5.0–8.0)

## 2016-01-09 MED ORDER — CEFUROXIME AXETIL 250 MG PO TABS: 250.0000 mg | ORAL_TABLET | Freq: Two times a day (BID) | ORAL | Status: DC

## 2016-01-09 MED ORDER — FLUCONAZOLE 150 MG PO TABS: ORAL_TABLET | ORAL | Status: DC

## 2016-01-09 NOTE — Patient Instructions (Signed)
Fluconazole tablets What is this medicine? FLUCONAZOLE (floo KON na zole) is an antifungal medicine. It is used to treat certain kinds of fungal or yeast infections. This medicine may be used for other purposes; ask your health care provider or pharmacist if you have questions. What should I tell my health care provider before I take this medicine? They need to know if you have any of these conditions: -electrolyte abnormalities -history of irregular heart beat -kidney disease -an unusual or allergic reaction to fluconazole, other azole antifungals, medicines, foods, dyes, or preservatives -pregnant or trying to get pregnant -breast-feeding How should I use this medicine? Take this medicine by mouth. Follow the directions on the prescription label. Do not take your medicine more often than directed. Talk to your pediatrician regarding the use of this medicine in children. Special care may be needed. This medicine has been used in children as young as 58 months of age. Overdosage: If you think you have taken too much of this medicine contact a poison control center or emergency room at once. NOTE: This medicine is only for you. Do not share this medicine with others. What if I miss a dose? If you miss a dose, take it as soon as you can. If it is almost time for your next dose, take only that dose. Do not take double or extra doses. What may interact with this medicine? Do not take this medicine with any of the following medications: -astemizole -certain medicines for irregular heart beat like dofetilide, dronedarone, quinidine -cisapride -erythromycin -lomitapide -other medicines that prolong the QT interval (cause an abnormal heart rhythm) -pimozide -terfenadine -thioridazine -tolvaptan -ziprasidone This medicine may also interact with the following medications: -antiviral medicines for HIV or AIDS -birth control pills -certain antibiotics like rifabutin, rifampin -certain  medicines for blood pressure like amlodipine, isradipine, felodipine, hydrochlorothiazide, losartan, nifedipine -certain medicines for cancer like cyclophosphamide, vinblastine, vincristine -certain medicines for cholesterol like atorvastatin, lovastatin, fluvastatin, simvastatin -certain medicines for depression, anxiety, or psychotic disturbances like amitriptyline, midazolam, nortriptyline, triazolam -certain medicines for diabetes like glipizide, glyburide, tolbutamide -certain medicines for pain like alfentanil, fentanyl, methadone -certain medicines for seizures like carbamazepine, phenytoin -certain medicines that treat or prevent blood clots like warfarin -halofantrine -medicines that lower your chance of fighting infection like cyclosporine, prednisone, tacrolimus -NSAIDS, medicines for pain and inflammation, like celecoxib, diclofenac, flurbiprofen, ibuprofen, meloxicam, naproxen -other medicines for fungal infections -sirolimus -theophylline -tofacitinib This list may not describe all possible interactions. Give your health care provider a list of all the medicines, herbs, non-prescription drugs, or dietary supplements you use. Also tell them if you smoke, drink alcohol, or use illegal drugs. Some items may interact with your medicine. What should I watch for while using this medicine? Visit your doctor or health care professional for regular checkups. If you are taking this medicine for a long time you may need blood work. Tell your doctor if your symptoms do not improve. Some fungal infections need many weeks or months of treatment to cure. Alcohol can increase possible damage to your liver. Avoid alcoholic drinks. If you have a vaginal infection, do not have sex until you have finished your treatment. You can wear a sanitary napkin. Do not use tampons. Wear freshly washed cotton, not synthetic, panties. What side effects may I notice from receiving this medicine? Side effects that  you should report to your doctor or health care professional as soon as possible: -allergic reactions like skin rash or itching, hives, swelling of the lips, mouth,  tongue, or throat -dark urine -feeling dizzy or faint -irregular heartbeat or chest pain -redness, blistering, peeling or loosening of the skin, including inside the mouth -trouble breathing -unusual bruising or bleeding -vomiting -yellowing of the eyes or skin Side effects that usually do not require medical attention (report to your doctor or health care professional if they continue or are bothersome): -changes in how food tastes -diarrhea -headache -stomach upset or nausea This list may not describe all possible side effects. Call your doctor for medical advice about side effects. You may report side effects to FDA at 1-800-FDA-1088. Where should I keep my medicine? Keep out of the reach of children. Store at room temperature below 30 degrees C (86 degrees F). Throw away any medicine after the expiration date. NOTE: This sheet is a summary. It may not cover all possible information. If you have questions about this medicine, talk to your doctor, pharmacist, or health care provider.    2016, Elsevier/Gold Standard. (2013-07-22 16:13:04) Cefuroxime tablets What is this medicine? CEFUROXIME (se fyoor OX eem) is a cephalosporin antibiotic. It is used to treat certain kinds of bacterial infections. It will not work for colds, flu, or other viral infections. This medicine may be used for other purposes; ask your health care provider or pharmacist if you have questions. What should I tell my health care provider before I take this medicine? They need to know if you have any of these conditions: -bleeding problems -bowel disease, like colitis -kidney disease -liver disease -an unusual or allergic reaction to cefuroxime, other antibiotics or medicines, foods, dyes or preservatives -pregnant or trying to get  pregnant -breast-feeding How should I use this medicine? Take this medicine by mouth with a full glass of water. Follow the directions on the prescription label. Do not crush or chew. This medicine works best if you take it with food. Take your medicine at regular intervals. Do not take your medicine more often than directed. Take all of your medicine as directed even if you think your are better. Do not skip doses or stop your medicine early. Talk to your pediatrician regarding the use of this medicine in children. Special care may be needed. While this drug may be prescribed for children as young as 25 months of age for selected conditions, precautions do apply. Overdosage: If you think you have taken too much of this medicine contact a poison control center or emergency room at once. NOTE: This medicine is only for you. Do not share this medicine with others. What if I miss a dose? If you miss a dose, take it as soon as you can. If it is almost time for your next dose, take only that dose. Do not take double or extra doses. What may interact with this medicine? This medicine may interact with the following medications: -antacids -birth control pills -certain medicines for infection like amikacin, gentamicin, tobramycin -diuretics -probenecid -warfarin This list may not describe all possible interactions. Give your health care provider a list of all the medicines, herbs, non-prescription drugs, or dietary supplements you use. Also tell them if you smoke, drink alcohol, or use illegal drugs. Some items may interact with your medicine. What should I watch for while using this medicine? Tell your doctor or health care professional if your symptoms do not improve or if you get new symptoms. Do not treat diarrhea with over the counter products. Contact your doctor if you have diarrhea that lasts more than 2 days or if it is severe  and watery. This medicine can interfere with some urine glucose  tests. If you use such tests, talk with your health care professional. If you are being treated for a sexually transmitted disease, avoid sexual contact until you have finished your treatment. Your sexual partner may also need treatment. What side effects may I notice from receiving this medicine? Side effects that you should report to your doctor or health care professional as soon as possible: -allergic reactions like skin rash, itching or hives, swelling of the face, lips, or tongue -dark urine -difficulty breathing -fever -irregular heartbeat or chest pain -redness, blistering, peeling or loosening of the skin, including inside the mouth -seizures -unusual bleeding or bruising -unusually weak or tired -white patches or sores in the mouth Side effects that usually do not require medical attention (report to your doctor or health care professional if they continue or are bothersome): -diarrhea -gas or heartburn -headache -nausea, vomiting -vaginal itching This list may not describe all possible side effects. Call your doctor for medical advice about side effects. You may report side effects to FDA at 1-800-FDA-1088. Where should I keep my medicine? Keep out of the reach of children. Store at room temperature between 15 and 30 degrees C (59 and 86 degrees F). Keep container tightly closed. Protect from moisture. Throw away any unused medicine after the expiration date. NOTE: This sheet is a summary. It may not cover all possible information. If you have questions about this medicine, talk to your doctor, pharmacist, or health care provider.    2016, Elsevier/Gold Standard. (2013-10-30 09:43:18) Monilial Vaginitis Vaginitis in a soreness, swelling and redness (inflammation) of the vagina and vulva. Monilial vaginitis is not a sexually transmitted infection. CAUSES  Yeast vaginitis is caused by yeast (candida) that is normally found in your vagina. With a yeast infection, the candida  has overgrown in number to a point that upsets the chemical balance. SYMPTOMS   White, thick vaginal discharge.  Swelling, itching, redness and irritation of the vagina and possibly the lips of the vagina (vulva).  Burning or painful urination.  Painful intercourse. DIAGNOSIS  Things that may contribute to monilial vaginitis are:  Postmenopausal and virginal states.  Pregnancy.  Infections.  Being tired, sick or stressed, especially if you had monilial vaginitis in the past.  Diabetes. Good control will help lower the chance.  Birth control pills.  Tight fitting garments.  Using bubble bath, feminine sprays, douches or deodorant tampons.  Taking certain medications that kill germs (antibiotics).  Sporadic recurrence can occur if you become ill. TREATMENT  Your caregiver will give you medication.  There are several kinds of anti monilial vaginal creams and suppositories specific for monilial vaginitis. For recurrent yeast infections, use a suppository or cream in the vagina 2 times a week, or as directed.  Anti-monilial or steroid cream for the itching or irritation of the vulva may also be used. Get your caregiver's permission.  Painting the vagina with methylene blue solution may help if the monilial cream does not work.  Eating yogurt may help prevent monilial vaginitis. HOME CARE INSTRUCTIONS   Finish all medication as prescribed.  Do not have sex until treatment is completed or after your caregiver tells you it is okay.  Take warm sitz baths.  Do not douche.  Do not use tampons, especially scented ones.  Wear cotton underwear.  Avoid tight pants and panty hose.  Tell your sexual partner that you have a yeast infection. They should go to their caregiver if  they have symptoms such as mild rash or itching.  Your sexual partner should be treated as well if your infection is difficult to eliminate.  Practice safer sex. Use condoms.  Some vaginal  medications cause latex condoms to fail. Vaginal medications that harm condoms are:  Cleocin cream.  Butoconazole (Femstat).  Terconazole (Terazol) vaginal suppository.  Miconazole (Monistat) (may be purchased over the counter). SEEK MEDICAL CARE IF:   You have a temperature by mouth above 102 F (38.9 C).  The infection is getting worse after 2 days of treatment.  The infection is not getting better after 3 days of treatment.  You develop blisters in or around your vagina.  You develop vaginal bleeding, and it is not your menstrual period.  You have pain when you urinate.  You develop intestinal problems.  You have pain with sexual intercourse.   This information is not intended to replace advice given to you by your health care provider. Make sure you discuss any questions you have with your health care provider.   Document Released: 09/23/2005 Document Revised: 03/07/2012 Document Reviewed: 06/17/2015 Elsevier Interactive Patient Education 2016 Elsevier Inc. Urinary Tract Infection Urinary tract infections (UTIs) can develop anywhere along your urinary tract. Your urinary tract is your body's drainage system for removing wastes and extra water. Your urinary tract includes two kidneys, two ureters, a bladder, and a urethra. Your kidneys are a pair of bean-shaped organs. Each kidney is about the size of your fist. They are located below your ribs, one on each side of your spine. CAUSES Infections are caused by microbes, which are microscopic organisms, including fungi, viruses, and bacteria. These organisms are so small that they can only be seen through a microscope. Bacteria are the microbes that most commonly cause UTIs. SYMPTOMS  Symptoms of UTIs may vary by age and gender of the patient and by the location of the infection. Symptoms in young women typically include a frequent and intense urge to urinate and a painful, burning feeling in the bladder or urethra during  urination. Older women and men are more likely to be tired, shaky, and weak and have muscle aches and abdominal pain. A fever may mean the infection is in your kidneys. Other symptoms of a kidney infection include pain in your back or sides below the ribs, nausea, and vomiting. DIAGNOSIS To diagnose a UTI, your caregiver will ask you about your symptoms. Your caregiver will also ask you to provide a urine sample. The urine sample will be tested for bacteria and white blood cells. White blood cells are made by your body to help fight infection. TREATMENT  Typically, UTIs can be treated with medication. Because most UTIs are caused by a bacterial infection, they usually can be treated with the use of antibiotics. The choice of antibiotic and length of treatment depend on your symptoms and the type of bacteria causing your infection. HOME CARE INSTRUCTIONS  If you were prescribed antibiotics, take them exactly as your caregiver instructs you. Finish the medication even if you feel better after you have only taken some of the medication.  Drink enough water and fluids to keep your urine clear or pale yellow.  Avoid caffeine, tea, and carbonated beverages. They tend to irritate your bladder.  Empty your bladder often. Avoid holding urine for long periods of time.  Empty your bladder before and after sexual intercourse.  After a bowel movement, women should cleanse from front to back. Use each tissue only once. [place]  IF:   You have back pain.  You develop a fever.  Your symptoms do not begin to resolve within 3 days. SEEK IMMEDIATE MEDICAL CARE IF:   You have severe back pain or lower abdominal pain.  You develop chills.  You have nausea or vomiting.  You have continued burning or discomfort with urination. MAKE SURE YOU:   Understand these instructions.  Will watch your condition.  Will get help right away if you are not doing well or get worse.   This information is  not intended to replace advice given to you by your health care provider. Make sure you discuss any questions you have with your health care provider.   Document Released: 09/23/2005 Document Revised: 09/04/2015 Document Reviewed: 01/22/2012 Elsevier Interactive Patient Education [employer].

## 2016-01-09 NOTE — Progress Notes (Signed)
   Patient presented to the office today for pessary maintenance. Patient with known history of uterine prolapse along with cystocele and rectocele and history of urinary incontinence.Patient with multiple comorbidities not surgical candidate see past medical history list. The patient has had history of lung cancer and lobectomy. She also has history of COPD. The patient is applying vaginal estrogen twice a week and has been doing well.She has done well with a ring pessary. Patient also was complaining today of vulvar irritation along with urinary frequency and some burning. Patient had some low abdominal discomfort a few days ago but it was attributed to her constipation. She has no true vaginal discharge. She has no back pain, no fever, no chills, no nausea, or vomiting.  Exam: Pelvic exam: Patient with small paper cut like area above the clitoris due to atrophy Bartholin urethra Skene glands with atrophic changes Vagina: No gross lesions or discharge, atrophic changes, second-degree cystocele and second-degree rectocele and first degree uterine descensus  Cervix: No gross lesions or discharge  Uterus axial, normal size, shape and consistency, non-tender and mobile  Adnexa Without masses or tenderness  The pessary was cleaned and replaced.   Urinalysis: Many bacteria, 3-10 RBC, 10-20 WBC and few yeast  Assessment/plan: #1 vulvar atrophy patient will be instructed to apply the vaginal estrogen as well externally near the vulvar because of her vulvar atrophy #2 patient to return back in 3 months for pessary maintenance #3 urinary tract infection review of patient's record indicated her last urinary tract infection in July 2016 the microorganisms identified was Klebsiella and sensitive to cephalosporin. She is going to be started on Ceftin 250 mg twice a day for 7 days. #4 if her yeast infection she'll be prescribed Diflucan 150 mg 1 by mouth today.

## 2016-01-11 LAB — URINE CULTURE
Colony Count: NO GROWTH
Organism ID, Bacteria: NO GROWTH

## 2016-01-13 ENCOUNTER — Other Ambulatory Visit: Payer: Self-pay | Admitting: Gynecology

## 2016-01-13 DIAGNOSIS — R350 Frequency of micturition: Secondary | ICD-10-CM

## 2016-01-20 ENCOUNTER — Other Ambulatory Visit: Payer: Medicare Other

## 2016-01-20 ENCOUNTER — Telehealth: Payer: Self-pay | Admitting: Neurology

## 2016-01-20 ENCOUNTER — Ambulatory Visit (INDEPENDENT_AMBULATORY_CARE_PROVIDER_SITE_OTHER): Payer: Self-pay

## 2016-01-20 DIAGNOSIS — R251 Tremor, unspecified: Secondary | ICD-10-CM | POA: Diagnosis not present

## 2016-01-20 DIAGNOSIS — R413 Other amnesia: Secondary | ICD-10-CM | POA: Diagnosis not present

## 2016-01-20 DIAGNOSIS — Z0289 Encounter for other administrative examinations: Secondary | ICD-10-CM

## 2016-01-20 DIAGNOSIS — R350 Frequency of micturition: Secondary | ICD-10-CM | POA: Diagnosis not present

## 2016-01-20 MED ORDER — GABAPENTIN 100 MG PO CAPS: 100.0000 mg | ORAL_CAPSULE | Freq: Three times a day (TID) | ORAL | Status: DC

## 2016-01-20 NOTE — Telephone Encounter (Signed)
I called the patient. The MRI the brain shows minimal white matter changes. The patient indicates that she cannot really tolerating diazepam well for the tremor. I will try low-dose gabapentin. She will follow-up in office in March 2017.   MRI brain 01/20/16:  IMPRESSION: This noncontrasted MRI of the brain shows the following: 1. Subcortical, deep and periventricular white matter T2/FLAIR hyperintense foci most consistent with chronic microvascular ischemic change. The extent is mildly more than expected for age. 2. Minimal to mild cortical atrophy, within normal limits for age. 3. There are no acute findings.

## 2016-01-21 LAB — URINALYSIS W MICROSCOPIC + REFLEX CULTURE
Bacteria, UA: NONE SEEN [HPF]
Bilirubin Urine: NEGATIVE
Casts: NONE SEEN [LPF]
Crystals: NONE SEEN [HPF]
Glucose, UA: NEGATIVE
Hgb urine dipstick: NEGATIVE
Ketones, ur: NEGATIVE
Nitrite: NEGATIVE
Protein, ur: NEGATIVE
Specific Gravity, Urine: 1.009 (ref 1.001–1.035)
Yeast: NONE SEEN [HPF]
pH: 6.5 (ref 5.0–8.0)

## 2016-01-22 LAB — URINE CULTURE

## 2016-01-24 ENCOUNTER — Ambulatory Visit (INDEPENDENT_AMBULATORY_CARE_PROVIDER_SITE_OTHER): Payer: Medicare Other | Admitting: Pulmonary Disease

## 2016-01-24 ENCOUNTER — Ambulatory Visit (INDEPENDENT_AMBULATORY_CARE_PROVIDER_SITE_OTHER)
Admission: RE | Admit: 2016-01-24 | Discharge: 2016-01-24 | Disposition: A | Discharge status: Home / Self Care | Payer: Medicare Other | Source: Ambulatory Visit | Attending: Pulmonary Disease | Admitting: Pulmonary Disease

## 2016-01-24 ENCOUNTER — Encounter: Payer: Self-pay | Admitting: Pulmonary Disease

## 2016-01-24 VITALS — BP 114/70 | HR 68 | Temp 98.1°F | Ht 64.75 in | Wt 158.6 lb

## 2016-01-24 DIAGNOSIS — R05 Cough: Secondary | ICD-10-CM | POA: Diagnosis not present

## 2016-01-24 DIAGNOSIS — J438 Other emphysema: Secondary | ICD-10-CM

## 2016-01-24 MED ORDER — PREDNISONE 5 MG PO TABS: ORAL_TABLET | ORAL | Status: DC

## 2016-01-24 NOTE — Progress Notes (Signed)
Subjective:    Patient ID: Lisa Vega, female    DOB: 1932/10/05, 82 y.o.   MRN: [id number]  HPI  Acute visit for COPD exacerbation  [name] is a 82 year old with COPD, lung cancer s/p lobectomy. She complains of shortness of breath with wheezing for the past 3 days. This is not associated with any cough, sputum production, hemoptysis, fevers, chills. She has some chest discomfort, tightness associated with the dyspnea.  PFT's 2013: FEV1 1.49 (81%), ratio 60, TLC 86%, DLCO 65% (stable to better from 2007)  Past Medical History  Diagnosis Date  . Cancer ([place])     lung ca  . COPD (chronic obstructive pulmonary disease) ([place])   . Dyspnea   . Rheumatoid arthritis(714.0)   . GERD (gastroesophageal reflux disease)   . Thyroid disease   . Hypercholesteremia   . Vitamin D deficiency   . 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 Memory difficulties 10/28/2015  . Uterine prolapse     Current outpatient prescriptions:  .  albuterol (PROAIR HFA) 108 (90 BASE) MCG/ACT inhaler, Inhale 2 puffs into the lungs every 4 (four) hours as needed for wheezing or shortness of breath., Disp: , Rfl:  .  alendronate (FOSAMAX) 70 MG tablet, Take 1 tablet (70 mg total) by mouth every 7 (seven) days. Take with a full glass of water on an empty stomach., Disp: 4 tablet, Rfl: 11 .  budesonide-formoterol (SYMBICORT) 160-4.5 MCG/ACT inhaler, USE 2 INHALATIONS ORALLY   INTO THE LUNGS TWO TIMES A DAY, Disp: 30.6 g, Rfl: 1 .  diazepam (VALIUM) 5 MG tablet, Take 5 mg by mouth daily as needed for anxiety. , Disp: , Rfl:  .  folic acid (FOLVITE) 1 MG tablet, Take 1 mg by mouth daily., Disp: , Rfl:  .  levothyroxine (SYNTHROID, LEVOTHROID) 88 MCG tablet, Take 88 mcg by mouth daily., Disp: , Rfl:  .  loratadine (CLARITIN) 10 MG tablet, Take 10 mg by mouth daily., Disp: , Rfl:  .  methotrexate 25 MG/ML SOLN, Inject 8.08 mg into the skin once a week. , Disp: , Rfl:  .  NONFORMULARY OR COMPOUNDED ITEM, Estradiol  .02% 1 ML Prefilled Applicator Sig: apply vaginally twice a week #90 Day Supply with 4 refills, Disp: 1 each, Rfl: 4 .  nystatin-triamcinolone (MYCOLOG II) cream, Apply 1 application topically 3 (three) times daily., Disp: 30 g, Rfl: 2 .  omeprazole (PRILOSEC) 20 MG capsule, Take 20 mg by mouth 2 (two) times daily., Disp: , Rfl:  .  sertraline (ZOLOFT) 50 MG tablet, Take 25 mg by mouth daily., Disp: , Rfl:  .  tiotropium (SPIRIVA) 18 MCG inhalation capsule, Place 18 mcg into inhaler and inhale daily., Disp: , Rfl:  .  Vitamin D, Ergocalciferol, (DRISDOL) 50000 UNITS CAPS capsule, Take 1 capsule (50,000 Units total) by mouth every 7 (seven) days., Disp: 12 capsule, Rfl: 4 .  gabapentin (NEURONTIN) 100 MG capsule, Take 1 capsule (100 mg total) by mouth 3 (three) times daily. (Patient not taking: Reported on 01/24/2016), Disp: 90 capsule, Rfl: 1 Review of Systems Dyspnea, wheezing. No cough, sputum production, hemoptysis. No fevers, chills, malaise, fatigue. + chest discomfort and tightness. No palpitation. No nausea, vomiting, diarrhea, constipation. Her other review of systems are negative    Objective:   Physical Exam Blood pressure 114/70, pulse 68, temperature 98.1 F (36.7 C), temperature source Oral, height 5' 4.75" (1.645 m), weight 158 lb 9.6 oz (71.94 kg), SpO2 93 %. Gen: No apparent  distress Neuro: No gross focal deficits. Neck: No JVD, lymphadenopathy, thyromegaly. RS: Clear, no wheeze, crackles CVS: S1-S2 heard, no murmurs rubs gallops. Abdomen: Soft, positive bowel sounds. Extremities: No edema.    Assessment & Plan:  COPD exacerbation.  I'll treat her with a prednisone taper. She states that she cannot tolerate high doses of prednisone. I'll start at 20 mg and reduced by 5 mg every 2 days. I do not believe she needs antibiotics as there are no fevers, sputum production. She will get a chest x-ray today.  Plan: - Prednisone taper starting at 20 mg. - Chest x-ray - Continue  inhalers as prescribed.  Marshell Garfinkel MD [facility] Pager [phone] If no answer or after 3pm call: [phone] 01/24/2016, 4:06 PM

## 2016-01-24 NOTE — Patient Instructions (Signed)
We will start you on pednisone 20 mg. Reduce dose by 5 mg every 2 days till complete. We will get a chest x ray today.  Follow up with Dr. Lake Bells.

## 2016-01-30 ENCOUNTER — Telehealth: Payer: Self-pay

## 2016-01-30 NOTE — Telephone Encounter (Signed)
Patient said she was in several mos ago and you gave her Rx for generic Linzess 145. She got  It at local pharmacy and has since found out that she can get a 90 day supply through her mail order pharmacy considerably less than she can get the 30 day at local pharmacy.  If okay please indicate refills if any. Thanks

## 2016-01-31 MED ORDER — LINACLOTIDE 145 MCG PO CAPS: 145.0000 ug | ORAL_CAPSULE | Freq: Every day | ORAL | Status: DC

## 2016-01-31 NOTE — Telephone Encounter (Signed)
Linzess 145 #90 4 refills

## 2016-01-31 NOTE — Telephone Encounter (Signed)
Rx sent, pt aware 

## 2016-02-04 ENCOUNTER — Telehealth: Payer: Self-pay | Admitting: Pulmonary Disease

## 2016-02-04 MED ORDER — TIOTROPIUM BROMIDE MONOHYDRATE 18 MCG IN CAPS: 18.0000 ug | ORAL_CAPSULE | Freq: Every day | RESPIRATORY_TRACT | Status: DC

## 2016-02-04 NOTE — Telephone Encounter (Signed)
ATC - NA - Will call back

## 2016-02-04 NOTE — Telephone Encounter (Signed)
Try a higher dose of prednisone: Take '40mg'$  po daily for 3 days, then take '30mg'$  po daily for 3 days, then take '20mg'$  po daily for two days, then take '10mg'$  po daily for 2 days  If this doesn't work or if she feels that isn't the right way to go then schedule an acute visit.  Hard to know what to do since I haven't seen her but her symptoms sound like COPD flaring up.

## 2016-02-04 NOTE — Telephone Encounter (Signed)
Spoke with pt. She finished Prednisone 5 days ago.  Still c/o some chest pressure and some sob, occas cough.  Sill taking Symbicort, Spiriva and Proair prn.  Doesn't feel like Prednisone helped her that much.  Would like further recommendations.  Pt states she is ok for call back tomorrow.

## 2016-02-05 DIAGNOSIS — Z79899 Other long term (current) drug therapy: Secondary | ICD-10-CM | POA: Diagnosis not present

## 2016-02-05 DIAGNOSIS — M0579 Rheumatoid arthritis with rheumatoid factor of multiple sites without organ or systems involvement: Secondary | ICD-10-CM | POA: Diagnosis not present

## 2016-02-05 DIAGNOSIS — N390 Urinary tract infection, site not specified: Secondary | ICD-10-CM | POA: Diagnosis not present

## 2016-02-05 NOTE — Telephone Encounter (Signed)
noted 

## 2016-02-05 NOTE — Telephone Encounter (Signed)
Spoke with pt  I advised of recs per BQ  She refused higher dose of prednisone  I advised in this case, needs ov  She refused appt with NP for today and wishes only to see BQ  I advised that BQ has no openings soon and offered appt again with another MD and she refused this as well  I advised to call back for ov if she changes her mind or seek emergent care if needed  Will forward to BQ to make him aware

## 2016-02-26 ENCOUNTER — Encounter: Payer: Self-pay | Admitting: Neurology

## 2016-02-26 ENCOUNTER — Telehealth: Payer: Self-pay | Admitting: *Deleted

## 2016-02-26 ENCOUNTER — Ambulatory Visit (INDEPENDENT_AMBULATORY_CARE_PROVIDER_SITE_OTHER): Payer: Medicare Other | Admitting: Neurology

## 2016-02-26 VITALS — BP 119/68 | HR 72 | Ht 64.5 in | Wt 160.0 lb

## 2016-02-26 DIAGNOSIS — R251 Tremor, unspecified: Secondary | ICD-10-CM | POA: Diagnosis not present

## 2016-02-26 MED ORDER — DIAZEPAM 5 MG PO TABS: 2.5000 mg | ORAL_TABLET | Freq: Two times a day (BID) | ORAL | Status: DC | PRN

## 2016-02-26 NOTE — Progress Notes (Signed)
Reason for visit: Tremor  Lisa Vega is an 82 y.o. female  History of present illness:  Lisa Vega is an 82 year old right-handed white female with a history of rheumatoid arthritis. The patient has developed a tremor involving the jaw and a vocal tremor. The patient was given a trial on gabapentin, but this resulted in too much drowsiness, and did not really help the tremor. The patient has diazepam and she takes one half of a 5 mg tablet intermittently, she believes that this does help the tremor. Anxiety or fatigue worsens the tremor. She denies any difficulty with swallowing. She has some mild gait instability, but she reports no falls. She returns to the office today for an evaluation. MRI of the brain has been done, this showed some minimal small vessel disease.   Past Medical History  Diagnosis Date  . Cancer ([place])     lung ca  . COPD (chronic obstructive pulmonary disease) ([place])   . Dyspnea   . Rheumatoid arthritis(714.0)   . GERD (gastroesophageal reflux disease)   . Thyroid disease   . Hypercholesteremia   . Vitamin D deficiency   . 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 Memory difficulties 10/28/2015  . Uterine prolapse     Past Surgical History  Procedure Laterality Date  . Right lobectomy  10/2004  . Appendectomy    . Tonsillectomy and adenoidectomy    . Hemorrhoid surgery      Family History  Problem Relation Age of Onset  . Emphysema Mother   . Asthma Mother   . Lung cancer Father   . Pancreatic cancer Father   . Bone cancer Father   . Heart disease Brother     x1  . Heart disease Sister     x2  . Stroke Maternal Grandfather     Social history:  reports that she quit smoking about 11 years ago. Her smoking use included Cigarettes. She has a 20 pack-year smoking history. She has never used smokeless tobacco. She reports that she drinks alcohol. She reports that she does not use illicit drugs.    Allergies  Allergen Reactions  . Aspirin     . Codeine     REACTION: hallucinations  . Demerol   . Meperidine Hcl     REACTION: severe GI upset    Medications:  Prior to Admission medications   Medication Sig Start Date End Date Taking? Authorizing Provider  acetaminophen (TYLENOL) 325 MG tablet Take 650 mg by mouth every 6 (six) hours as needed.   Yes Historical Provider, MD  albuterol (PROAIR HFA) 108 (90 BASE) MCG/ACT inhaler Inhale 2 puffs into the lungs every 4 (four) hours as needed for wheezing or shortness of breath.   Yes Historical Provider, MD  alendronate (FOSAMAX) 70 MG tablet Take 1 tablet (70 mg total) by mouth every 7 (seven) days. Take with a full glass of water on an empty stomach. 10/09/15  Yes Terrance Mass, MD  budesonide-formoterol (SYMBICORT) 160-4.5 MCG/ACT inhaler USE 2 INHALATIONS ORALLY   INTO THE LUNGS TWO TIMES A DAY 01/06/16  Yes Juanito Doom, MD  diazepam (VALIUM) 5 MG tablet Take 5 mg by mouth daily as needed for anxiety.    Yes Historical Provider, MD  folic acid (FOLVITE) 1 MG tablet Take 1 mg by mouth daily.   Yes Historical Provider, MD  gabapentin (NEURONTIN) 100 MG capsule Take 1 capsule (100 mg total) by mouth 3 (three) times daily. 01/20/16  Yes Kathrynn Ducking, MD  levothyroxine (SYNTHROID, LEVOTHROID) 88 MCG tablet Take 88 mcg by mouth daily.   Yes Historical Provider, MD  Linaclotide Rolan Lipa) 145 MCG CAPS capsule Take 1 capsule (145 mcg total) by mouth daily. 01/31/16  Yes Terrance Mass, MD  loratadine (CLARITIN) 10 MG tablet Take 10 mg by mouth daily.   Yes Historical Provider, MD  methotrexate 25 MG/ML SOLN Inject 8.08 mg into the skin once a week.    Yes Historical Provider, MD  NONFORMULARY OR COMPOUNDED ITEM Estradiol .02% 1 ML Prefilled Applicator Sig: apply vaginally twice a week #90 Day Supply with 4 refills 05/02/15  Yes Terrance Mass, MD  nystatin-triamcinolone [facility] II) cream Apply 1 application topically 3 (three) times daily. 01/29/15  Yes Terrance Mass, MD   omeprazole (PRILOSEC) 20 MG capsule Take 20 mg by mouth 2 (two) times daily.   Yes Historical Provider, MD  predniSONE (DELTASONE) 5 MG tablet Take 4 tabs by mouth once daily for 2 days, then 3 tabs for 2 days, 2 tabs for 2 days, 1 for 2 days and stop. 01/24/16  Yes Praveen Mannam, MD  sertraline (ZOLOFT) 50 MG tablet Take 25 mg by mouth daily.   Yes Historical Provider, MD  tiotropium (SPIRIVA) 18 MCG inhalation capsule Place 1 capsule (18 mcg total) into inhaler and inhale daily. 02/04/16  Yes Juanito Doom, MD  Vitamin D, Ergocalciferol, (DRISDOL) 50000 UNITS CAPS capsule Take 1 capsule (50,000 Units total) by mouth every 7 (seven) days. 10/17/15  Yes Terrance Mass, MD    ROS:  Out of a complete 14 system review of symptoms, the patient complains only of the following symptoms, and all other reviewed systems are negative.  Light sensitivity Shortness of breath, chest tightness Abdominal pain, constipation Frequent waking Tremors Depression, anxiety  Blood pressure 119/68, pulse 72, height 5' 4.5" (1.638 m), weight 160 lb (72.576 kg).  Physical Exam  General: The patient is alert and cooperative at the time of the examination.  Skin: No significant peripheral edema is noted.   Neurologic Exam  Mental status: The patient is alert and oriented x 3 at the time of the examination. The patient has apparent normal recent and remote memory, with an apparently normal attention span and concentration ability.   Cranial nerves: Facial symmetry is present. Speech is normal, no aphasia or dysarthria is noted. Extraocular movements are full. Visual fields are full. A jaw tremor is seen intermittently.  Motor: The patient has good strength in all 4 extremities.  Sensory examination: Soft touch sensation is symmetric on the face, arms, and legs.  Coordination: The patient has good finger-nose-finger and heel-to-shin bilaterally.  Gait and station: The patient has a normal gait.  Tandem gait is normal. Romberg is negative. No drift is seen.  Reflexes: Deep tendon reflexes are symmetric.   MRI brain 01/20/16:  IMPRESSION: This noncontrasted MRI of the brain shows the following: 1. Subcortical, deep and periventricular white matter T2/FLAIR hyperintense foci most consistent with chronic microvascular ischemic change. The extent is mildly more than expected for age. 2. Minimal to mild cortical atrophy, within normal limits for age. 3. There are no acute findings.          * MRI scan images were reviewed online. I agree with the written report.     Assessment/Plan:  1. Essential tremor  2. Rheumatoid arthritis  The patient is having some issues with a mild tremor involving the jaw, and some issues with  a vocal tremor. She gained some benefit with diazepam, she may take this intermittently if needed. A prescription was written. The patient will not take medication daily. She will follow-up in one year, sooner if needed.  Jill Alexanders MD 02/26/2016 7:13 PM  [facility] [address] [place],  [address]  Phone [phone] Fax [phone]

## 2016-02-26 NOTE — Telephone Encounter (Signed)
Called pt, lvm informing her prescription was faxed to [facility] ,[place] Left this caller's name, number.

## 2016-02-26 NOTE — Telephone Encounter (Signed)
Pt returned and has picked up rx. She said thank you

## 2016-02-26 NOTE — Patient Instructions (Signed)
Tremor °A tremor is trembling or shaking that you cannot control. Most tremors affect the hands or arms. Tremors can also affect the head, vocal cords, face, and other parts of the body. There are many types of tremors. Common types include:  °· Essential tremor. These usually occur in people over the age of 40. It may run in families and can happen in otherwise healthy people.   °· Resting tremor. These occur when the muscles are at rest, such as when your hands are resting in your lap. People with Parkinson disease often have resting tremors.   °· Postural tremor. These occur when you try to hold a pose, such as keeping your hands outstretched.   °· Kinetic tremor. These occur during purposeful movement, such as trying to touch a finger to your nose.   °· Task-specific tremor. These may occur when you perform tasks such as handwriting, speaking, or standing.   °· Psychogenic tremor. These dramatically lessen or disappear when you are distracted. They can happen in people of all ages.   °Some types of tremors have no known cause. Tremors can also be a symptom of nervous system problems (neurological disorders) that may occur with aging. Some tremors go away with treatment while others do not.  °HOME CARE INSTRUCTIONS °Watch your tremor for any changes. The following actions may help to lessen any discomfort you are feeling: °· Take medicines only as directed by your health care provider.   °· Limit alcohol intake to no more than 1 drink per day for nonpregnant women and 2 drinks per day for men. One drink equals 12 oz of beer, 5 oz of wine, or 1½ oz of hard liquor. °· Do not use any tobacco products, including cigarettes, chewing tobacco, or electronic cigarettes. If you need help quitting, ask your health care provider.   °· Avoid extreme heat or cold.    °· Limit the amount of caffeine you consume as directed by your health care provider.   °· Try to get 8 hours of sleep each night. °· Find ways to manage your  stress, such as meditation or yoga. °· Keep all follow-up visits as directed by your health care provider. This is important. °SEEK MEDICAL CARE IF: °· You start having a tremor after starting a new medicine. °· You have tremor with other symptoms such as: °¨ Numbness. °¨ Tingling. °¨ Pain. °¨ Weakness. °· Your tremor gets worse. °· Your tremor interferes with your day-to-day life. °  °This information is not intended to replace advice given to you by your health care provider. Make sure you discuss any questions you have with your health care provider. °  °Document Released: 12/04/2002 Document Revised: 01/04/2015 Document Reviewed: 06/11/2014 °Elsevier Interactive Patient Education ©2016 Elsevier Inc. ° °

## 2016-03-06 ENCOUNTER — Ambulatory Visit: Payer: Medicare Other | Admitting: Internal Medicine

## 2016-03-10 DIAGNOSIS — M47816 Spondylosis without myelopathy or radiculopathy, lumbar region: Secondary | ICD-10-CM | POA: Diagnosis not present

## 2016-03-23 DIAGNOSIS — R208 Other disturbances of skin sensation: Secondary | ICD-10-CM | POA: Diagnosis not present

## 2016-03-23 DIAGNOSIS — Z961 Presence of intraocular lens: Secondary | ICD-10-CM | POA: Diagnosis not present

## 2016-03-23 DIAGNOSIS — H1851 Endothelial corneal dystrophy: Secondary | ICD-10-CM | POA: Diagnosis not present

## 2016-03-23 DIAGNOSIS — H109 Unspecified conjunctivitis: Secondary | ICD-10-CM | POA: Diagnosis not present

## 2016-03-26 DIAGNOSIS — M25562 Pain in left knee: Secondary | ICD-10-CM | POA: Diagnosis not present

## 2016-03-26 DIAGNOSIS — M25511 Pain in right shoulder: Secondary | ICD-10-CM | POA: Diagnosis not present

## 2016-03-26 DIAGNOSIS — M50322 Other cervical disc degeneration at C5-C6 level: Secondary | ICD-10-CM | POA: Diagnosis not present

## 2016-03-26 DIAGNOSIS — M47816 Spondylosis without myelopathy or radiculopathy, lumbar region: Secondary | ICD-10-CM | POA: Diagnosis not present

## 2016-04-03 ENCOUNTER — Encounter: Payer: Self-pay | Admitting: Women's Health

## 2016-04-03 ENCOUNTER — Ambulatory Visit (INDEPENDENT_AMBULATORY_CARE_PROVIDER_SITE_OTHER): Payer: Medicare Other | Admitting: Women's Health

## 2016-04-03 VITALS — BP 118/80 | Ht 64.0 in | Wt 160.0 lb

## 2016-04-03 DIAGNOSIS — N898 Other specified noninflammatory disorders of vagina: Secondary | ICD-10-CM

## 2016-04-03 DIAGNOSIS — B373 Candidiasis of vulva and vagina: Secondary | ICD-10-CM

## 2016-04-03 DIAGNOSIS — B3731 Acute candidiasis of vulva and vagina: Secondary | ICD-10-CM

## 2016-04-03 LAB — WET PREP FOR TRICH, YEAST, CLUE
Clue Cells Wet Prep HPF POC: NONE SEEN
Trich, Wet Prep: NONE SEEN

## 2016-04-03 MED ORDER — NYSTATIN-TRIAMCINOLONE 100000-0.1 UNIT/GM-% EX CREA: 1.0000 "application " | TOPICAL_CREAM | Freq: Three times a day (TID) | CUTANEOUS | Status: DC

## 2016-04-03 MED ORDER — FLUCONAZOLE 150 MG PO TABS: ORAL_TABLET | ORAL | Status: DC

## 2016-04-03 NOTE — Progress Notes (Signed)
Patient ID: Lisa Vega, female   DOB: 1932/02/17, 82 y.o.   MRN: [id number] Presents for pessary maintenance. Has done well with a ring pessary with support for uterine prolapse and cystocele. Reports vaginal irritation with itching, thinks she has a yeast infection. Denies urinary symptoms of pain, burning or frequency. Minimal leakage, still wears a pad most days.  Exam: Appears well. External genitalia erythematous at introitus, ring pessary removed, washed, speculum exam vaginal atrophy with minimal discharge, wet prep positive for a few yeast. Ring pessary with support replaced with ease.  Pessary maintenance  Plan: Return to office in 3 months or as needed. Diflucan 150 today, repeat in 3 days if needed. Prescription, proper use given and reviewed. Mycolog ointment small amount external genitalia daily when necessary. Call if no relief.

## 2016-04-03 NOTE — Patient Instructions (Signed)

## 2016-04-08 ENCOUNTER — Ambulatory Visit: Payer: Medicare Other | Admitting: Gynecology

## 2016-04-09 ENCOUNTER — Ambulatory Visit (INDEPENDENT_AMBULATORY_CARE_PROVIDER_SITE_OTHER): Payer: Medicare Other | Admitting: Gynecology

## 2016-04-09 ENCOUNTER — Encounter: Payer: Self-pay | Admitting: Gynecology

## 2016-04-09 VITALS — BP 126/78

## 2016-04-09 DIAGNOSIS — R39198 Other difficulties with micturition: Secondary | ICD-10-CM

## 2016-04-09 DIAGNOSIS — N3000 Acute cystitis without hematuria: Secondary | ICD-10-CM | POA: Diagnosis not present

## 2016-04-09 DIAGNOSIS — N9489 Other specified conditions associated with female genital organs and menstrual cycle: Secondary | ICD-10-CM | POA: Diagnosis not present

## 2016-04-09 DIAGNOSIS — R102 Pelvic and perineal pain: Secondary | ICD-10-CM

## 2016-04-09 DIAGNOSIS — N814 Uterovaginal prolapse, unspecified: Secondary | ICD-10-CM

## 2016-04-09 DIAGNOSIS — N898 Other specified noninflammatory disorders of vagina: Secondary | ICD-10-CM

## 2016-04-09 DIAGNOSIS — R3989 Other symptoms and signs involving the genitourinary system: Secondary | ICD-10-CM | POA: Diagnosis not present

## 2016-04-09 LAB — URINALYSIS W MICROSCOPIC + REFLEX CULTURE
Bilirubin Urine: NEGATIVE
Casts: NONE SEEN [LPF]
Crystals: NONE SEEN [HPF]
Glucose, UA: NEGATIVE
Ketones, ur: NEGATIVE
Nitrite: NEGATIVE
Protein, ur: NEGATIVE
Specific Gravity, Urine: <1.005 (ref 1.001–1.035)
Yeast: NONE SEEN [HPF]
pH: 5.5 (ref 5.0–8.0)

## 2016-04-09 MED ORDER — CIPROFLOXACIN HCL 250 MG PO TABS
250.0000 mg | ORAL_TABLET | Freq: Two times a day (BID) | ORAL | Status: DC
Start: 2016-04-09 — End: 2016-05-06

## 2016-04-09 NOTE — Patient Instructions (Signed)
Ciprofloxacin extended-release tablets What is this medicine? CIPROFLOXACIN (sip roe FLOX a sin) is a quinolone antibiotic. It is used to treat certain kinds of bacterial infections. It will not work for colds, flu, or other viral infections. This medicine may be used for other purposes; ask your health care provider or pharmacist if you have questions. What should I tell my health care provider before I take this medicine? They need to know if you have any of these conditions: -bone problems -cerebral disease -history of low potassium levels in the blood -irregular heartbeat -joint problems -kidney disease -myasthenia gravis -seizures -tendon problems -tingling of the fingers or toes, or other nerve disorder -an unusual or allergic reaction to ciprofloxacin, other antibiotics or medicines, foods, dyes, or preservatives -pregnant or trying to get pregnant -breast-feeding How should I use this medicine? Take this medicine by mouth with a full glass of water. Follow the directions on the prescription label. Do not split, crush, or chew the tablet. Take your medicine at regular intervals. Do not take your medicine more often than directed. Take all of your medicine as directed even if you think your are better. Do not skip doses or stop your medicine early. You can take this medicine with food or on an empty stomach. It can be taken with a meal that contains dairy or calcium, but do not take it alone with a dairy product, like milk or yogurt, or calcium-fortified juice. A special MedGuide will be given to you by the pharmacist with each prescription and refill. Be sure to read this information carefully each time. Talk to your pediatrician regarding the use of this medicine in children. Special care may be needed. Overdosage: If you think you have taken too much of this medicine contact a poison control center or emergency room at once. NOTE: This medicine is only for you. Do not share this  medicine with others. What if I miss a dose? If you miss a dose, take it as soon as you can. If it is almost time for your next dose, take only that dose. Do not take double or extra doses. Do not take more than one dose in a day. What may interact with this medicine? Do not take this medicine with any of the following medications: -cisapride -droperidol -terfenadine -tizanidine This medicine may also interact with the following medications: -antacids -birth control pills -caffeine -cyclosporin -didanosine (ddI) buffered tablets or powder -medicines for diabetes -medicines for inflammation like ibuprofen, naproxen -methotrexate -multivitamins -omeprazole -phenytoin -probenecid -sucralfate -theophylline -warfarin This list may not describe all possible interactions. Give your health care provider a list of all the medicines, herbs, non-prescription drugs, or dietary supplements you use. Also tell them if you smoke, drink alcohol, or use illegal drugs. Some items may interact with your medicine. What should I watch for while using this medicine? Tell your doctor or health care professional if your symptoms do not improve. Do not treat diarrhea with over the counter products. Contact your doctor if you have diarrhea that lasts more than 2 days or if it is severe and watery. You may get drowsy or dizzy. Do not drive, use machinery, or do anything that needs mental alertness until you know how this medicine affects you. Do not stand or sit up quickly, especially if you are an older patient. This reduces the risk of dizzy or fainting spells. This medicine can make you more sensitive to the sun. Keep out of the sun. If you cannot avoid being in  the sun, wear protective clothing and use sunscreen. Do not use sun lamps or tanning beds/booths. Avoid antacids, aluminum, calcium, iron, magnesium, and zinc products for 6 hours before and 2 hours after taking a dose of this medicine. What side  effects may I notice from receiving this medicine? Side effects that you should report to your doctor or health care professional as soon as possible: -allergic reactions like skin rash or hives, swelling of the face, lips, or tongue -anxious -confusion -depressed mood -diarrhea -fast, irregular heartbeat -hallucination, loss of contact with reality -joint, muscle, or tendon pain or swelling -pain, tingling, numbness in the hands or feet -suicidal thoughts or other mood changes -sunburn -unusually weak or tired Side effects that usually do not require medical attention (report to your doctor or health care professional if they continue or are bothersome): -dry mouth -headache -nausea -trouble sleeping This list may not describe all possible side effects. Call your doctor for medical advice about side effects. You may report side effects to FDA at 1-800-FDA-1088. Where should I keep my medicine? Keep out of the reach of children. Store at room temperature between 15 to 30 degrees C (59 to 86 degrees F). Keep container tightly closed. Throw away any unused medicine after the expiration date. NOTE: This sheet is a summary. It may not cover all possible information. If you have questions about this medicine, talk to your doctor, pharmacist, or health care provider.    2016, Elsevier/Gold Standard. (2015-07-25 12:49:29) Urinary Tract Infection Urinary tract infections (UTIs) can develop anywhere along your urinary tract. Your urinary tract is your body's drainage system for removing wastes and extra water. Your urinary tract includes two kidneys, two ureters, a bladder, and a urethra. Your kidneys are a pair of bean-shaped organs. Each kidney is about the size of your fist. They are located below your ribs, one on each side of your spine. CAUSES Infections are caused by microbes, which are microscopic organisms, including fungi, viruses, and bacteria. These organisms are so small that they  can only be seen through a microscope. Bacteria are the microbes that most commonly cause UTIs. SYMPTOMS  Symptoms of UTIs may vary by age and gender of the patient and by the location of the infection. Symptoms in young women typically include a frequent and intense urge to urinate and a painful, burning feeling in the bladder or urethra during urination. Older women and men are more likely to be tired, shaky, and weak and have muscle aches and abdominal pain. A fever may mean the infection is in your kidneys. Other symptoms of a kidney infection include pain in your back or sides below the ribs, nausea, and vomiting. DIAGNOSIS To diagnose a UTI, your caregiver will ask you about your symptoms. Your caregiver will also ask you to provide a urine sample. The urine sample will be tested for bacteria and white blood cells. White blood cells are made by your body to help fight infection. TREATMENT  Typically, UTIs can be treated with medication. Because most UTIs are caused by a bacterial infection, they usually can be treated with the use of antibiotics. The choice of antibiotic and length of treatment depend on your symptoms and the type of bacteria causing your infection. HOME CARE INSTRUCTIONS  If you were prescribed antibiotics, take them exactly as your caregiver instructs you. Finish the medication even if you feel better after you have only taken some of the medication.  Drink enough water and fluids to keep your urine  clear or pale yellow.  Avoid caffeine, tea, and carbonated beverages. They tend to irritate your bladder.  Empty your bladder often. Avoid holding urine for long periods of time.  Empty your bladder before and after sexual intercourse.  After a bowel movement, women should cleanse from front to back. Use each tissue only once. SEEK MEDICAL CARE IF:   You have back pain.  You develop a fever.  Your symptoms do not begin to resolve within 3 days. SEEK IMMEDIATE MEDICAL  CARE IF:   You have severe back pain or lower abdominal pain.  You develop chills.  You have nausea or vomiting.  You have continued burning or discomfort with urination. MAKE SURE YOU:   Understand these instructions.  Will watch your condition.  Will get help right away if you are not doing well or get worse.   This information is not intended to replace advice given to you by your health care provider. Make sure you discuss any questions you have with your health care provider.   Document Released: 09/23/2005 Document Revised: 09/04/2015 Document Reviewed: 01/22/2012 Elsevier Interactive Patient Education [employer].

## 2016-04-09 NOTE — Progress Notes (Signed)
   Patient is an 82 year old presented to the office complaining of difficulty urinating at times and vaginal pruritus. Patient with history of uterine prolapse and cystocele has had a ring pessary for support and has done well for several years. She was treated last week for her yeast infection with Diflucan 150 mg every other day for 3 days and had her pessary cleansed.Patient with multiple comorbidities not surgical candidate see past medical history list. The patient has had history of lung cancer and lobectomy. She also has history of COPD. The patient is applying vaginal estrogen 3 times a week. She denies any dysuria per se any back pain, fever, chills, nausea, vomiting  Exam: Patient no acute distress with exception of the above-mentioned in her history. Pelvic exam: Patient with small paper cut like area above the clitoris due to atrophy Bartholin urethra Skene glands with atrophic changes Vagina: No gross lesions or discharge, atrophic changes, second-degree cystocele and second-degree rectocele and first degree uterine descensus  Cervix: No gross lesions or discharge  Uterus axial, normal size, shape and consistency, non-tender and mobile  Adnexa Without masses or tenderness   UA today: 6-10 WBC, few bacteria culture pending  Assessment/plan: Patient with uterine prolapse cystocele with multiple comorbidities was treated last week for yeast infection with Diflucan 150 mg every other day for 3 days. Her pessary was cleansed today and then replaced back into the vagina. It appears she may have an early urinary tract infection and since a long weekend is coming I'm going to treat her with Macrobid twice a day for 7 days as we wait for the culture. She will hold off on the estrogen vaginal cream 3 times a week for one week but use the Mycolog external genitalia and some in the vagina every night for the next 5-7 days.

## 2016-04-11 LAB — URINE CULTURE

## 2016-04-13 ENCOUNTER — Ambulatory Visit: Payer: Medicare Other | Admitting: Pulmonary Disease

## 2016-05-06 ENCOUNTER — Ambulatory Visit (INDEPENDENT_AMBULATORY_CARE_PROVIDER_SITE_OTHER): Payer: Medicare Other | Admitting: Internal Medicine

## 2016-05-06 ENCOUNTER — Encounter: Payer: Self-pay | Admitting: Internal Medicine

## 2016-05-06 ENCOUNTER — Other Ambulatory Visit (INDEPENDENT_AMBULATORY_CARE_PROVIDER_SITE_OTHER): Payer: Medicare Other

## 2016-05-06 VITALS — BP 136/84 | HR 72 | Temp 98.2°F | Resp 16 | Ht 64.0 in | Wt 163.0 lb

## 2016-05-06 DIAGNOSIS — M05742 Rheumatoid arthritis with rheumatoid factor of left hand without organ or systems involvement: Secondary | ICD-10-CM

## 2016-05-06 DIAGNOSIS — R7303 Prediabetes: Secondary | ICD-10-CM

## 2016-05-06 DIAGNOSIS — K449 Diaphragmatic hernia without obstruction or gangrene: Secondary | ICD-10-CM | POA: Insufficient documentation

## 2016-05-06 DIAGNOSIS — M810 Age-related osteoporosis without current pathological fracture: Secondary | ICD-10-CM

## 2016-05-06 DIAGNOSIS — E038 Other specified hypothyroidism: Secondary | ICD-10-CM | POA: Diagnosis not present

## 2016-05-06 DIAGNOSIS — F418 Other specified anxiety disorders: Secondary | ICD-10-CM

## 2016-05-06 DIAGNOSIS — M05741 Rheumatoid arthritis with rheumatoid factor of right hand without organ or systems involvement: Secondary | ICD-10-CM

## 2016-05-06 DIAGNOSIS — F32A Depression, unspecified: Secondary | ICD-10-CM | POA: Insufficient documentation

## 2016-05-06 DIAGNOSIS — E039 Hypothyroidism, unspecified: Secondary | ICD-10-CM | POA: Insufficient documentation

## 2016-05-06 LAB — CBC WITH DIFFERENTIAL/PLATELET
Basophils Absolute: 0.1 10*3/uL (ref 0.0–0.1)
Basophils Relative: 0.7 % (ref 0.0–3.0)
Eosinophils Absolute: 0.1 10*3/uL (ref 0.0–0.7)
Eosinophils Relative: 1.4 % (ref 0.0–5.0)
HCT: 38.3 % (ref 36.0–46.0)
Hemoglobin: 12.8 g/dL (ref 12.0–15.0)
Lymphocytes Relative: 55.9 % — ABNORMAL HIGH (ref 12.0–46.0)
Lymphs Abs: 4 10*3/uL (ref 0.7–4.0)
MCHC: 33.4 g/dL (ref 30.0–36.0)
MCV: 95.8 fl (ref 78.0–100.0)
Monocytes Absolute: 0.6 10*3/uL (ref 0.1–1.0)
Monocytes Relative: 8.3 % (ref 3.0–12.0)
Neutro Abs: 2.5 10*3/uL (ref 1.4–7.7)
Neutrophils Relative %: 34.4 % — ABNORMAL LOW (ref 43.0–77.0)
Platelets: 243 10*3/uL (ref 150.0–400.0)
RBC: 3.99 Mil/uL (ref 3.87–5.11)
RDW: 14.6 % (ref 11.5–15.5)
WBC: 7.2 10*3/uL (ref 4.0–10.5)

## 2016-05-06 LAB — COMPREHENSIVE METABOLIC PANEL
ALT: 13 U/L (ref 0–35)
AST: 19 U/L (ref 0–37)
Albumin: 3.9 g/dL (ref 3.5–5.2)
Alkaline Phosphatase: 72 U/L (ref 39–117)
BUN: 9 mg/dL (ref 6–23)
CO2: 30 mEq/L (ref 19–32)
Calcium: 9.1 mg/dL (ref 8.4–10.5)
Chloride: 104 mEq/L (ref 96–112)
Creatinine, Ser: 0.86 mg/dL (ref 0.40–1.20)
GFR: 67.21 mL/min (ref >60.00)
Glucose, Bld: 96 mg/dL (ref 70–99)
Potassium: 4 mEq/L (ref 3.5–5.1)
Sodium: 139 mEq/L (ref 135–145)
Total Bilirubin: 0.6 mg/dL (ref 0.2–1.2)
Total Protein: 7.2 g/dL (ref 6.0–8.3)

## 2016-05-06 LAB — HEMOGLOBIN A1C: Hgb A1c MFr Bld: 6.1 % (ref 4.6–6.5)

## 2016-05-06 LAB — TSH: TSH: 0.67 u[IU]/mL (ref 0.35–4.50)

## 2016-05-06 MED ORDER — PANTOPRAZOLE SODIUM 40 MG PO TBEC: 40.0000 mg | DELAYED_RELEASE_TABLET | Freq: Every day | ORAL | Status: DC

## 2016-05-06 NOTE — Progress Notes (Signed)
Pre visit review using our clinic review tool, if applicable. No additional management support is needed unless otherwise documented below in the visit note. 

## 2016-05-06 NOTE — Progress Notes (Signed)
Subjective:    Patient ID: Lisa Vega, female    DOB: [birth year], 82 y.o.   MRN: [id number]  HPI She is here to establish with a new pcp.    Eye abnormality:In the corner of her right eye she has slight discomfort and a scab. She is able to peel the scab away, but it keeps recurring. She denies any changes in her vision, but her eyes do feel more dry. She denies any swelling. She wanted to know if I thought she should see her ophthalmologist.  Fatigue:  She is so tired she can sometimes hardly get out of bed. She is not sure when her thyroid level was checked last. She is taking her medication daily as prescribed. He takes  Headaches:  She takes advil and tylenol as needed, which works well.  Back pain, knee ppain: She has chronic knee and back pain from osteoarthritis. She is currently following with Dr. Nelva Bush. She has received injections in both areas.  The injections help her knees, but has not helped her back.  Hypothyroidism:  She is taking her medication daily.  She does feel tired , but denies any recent changes in her weight that is unexplained.   COPD, Lung cancer: the Cancer was diagnosed in 2005.  She had surgery and chemotherapy.  There is no evidence of recurrence.  She uses the inhalers daily as prescirbed.    GERD:  She is taking her medication daily as prescribed.  She has GERD symptoms frequently.  She has hoarseness and occasionally difficulty swallowing. She does not feel that her heartburn is well controlled.  Anxiety:  She takes valium only on occasion when she can not sleep.    Medications and allergies reviewed with patient and updated if appropriate.  Patient Active Problem List   Diagnosis Date Noted  . Tremor 10/28/2015  . Memory difficulties 10/28/2015  . Hoarseness 10/14/2015  . History of vitamin D deficiency 05/02/2015  . Other constipation 01/29/2015  . COPD exacerbation ([place]) 01/08/2015  . Abdominal pain, chronic, epigastric 11/01/2014  .  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 ([place]) 05/30/2014  . Diverticulitis of colon without hemorrhage 08/07/2013  . Prolapse of female pelvic organs 06/22/2013  . Neck pain 03/10/2013  . Vaginal atrophy 03/09/2013  . Rectocele 03/09/2013  . Cystocele 03/09/2013  . Female pelvic pain 03/09/2013  . Urinary incontinence 03/09/2013  . COPD (chronic obstructive pulmonary disease) ([place]) 01/16/2011  . CARCINOMA, LUNG, NONSMALL CELL 12/17/2010  . ARTHRITIS, RHEUMATOID 12/17/2010  . DYSPNEA 12/17/2010    Current Outpatient Prescriptions on File Prior to Visit  Medication Sig Dispense Refill  . acetaminophen (TYLENOL) 325 MG tablet Take 650 mg by mouth every 6 (six) hours as needed.    Marland Kitchen albuterol (PROAIR HFA) 108 (90 BASE) MCG/ACT inhaler Inhale 2 puffs into the lungs every 4 (four) hours as needed for wheezing or shortness of breath.    Marland Kitchen alendronate (FOSAMAX) 70 MG tablet Take 1 tablet (70 mg total) by mouth every 7 (seven) days. Take with a full glass of water on an empty stomach. 4 tablet 11  . budesonide-formoterol (SYMBICORT) 160-4.5 MCG/ACT inhaler USE 2 INHALATIONS ORALLY   INTO THE LUNGS TWO TIMES A DAY 30.6 g 1  . ciprofloxacin (CIPRO) 250 MG tablet Take 1 tablet (250 mg total) by mouth 2 (two) times daily. 6 tablet 0  . clidinium-chlordiazePOXIDE (LIBRAX) 5-2.5 MG capsule as directed.  5  . diazepam (VALIUM) 5 MG tablet Take 0.5 tablets (  2.5 mg total) by mouth 2 (two) times daily as needed for anxiety. 30 tablet 3  . fluconazole (DIFLUCAN) 150 MG tablet Take one today repeat every 3 days 3 tablet 1  . fluorometholone (FML) 0.1 % ophthalmic suspension as directed.    . folic acid (FOLVITE) 1 MG tablet Take 1 mg by mouth daily.    Marland Kitchen levothyroxine (SYNTHROID, LEVOTHROID) 88 MCG tablet Take 88 mcg by mouth daily.    Marland Kitchen loratadine (CLARITIN) 10 MG tablet Take 10 mg by mouth daily.    . methotrexate 25 MG/ML SOLN Inject 8.08 mg into the skin once a week.     . NONFORMULARY OR  COMPOUNDED ITEM Estradiol .02% 1 ML Prefilled Applicator Sig: apply vaginally twice a week #90 Day Supply with 4 refills 1 each 4  . nystatin-triamcinolone (MYCOLOG II) cream Apply 1 application topically 3 (three) times daily. 30 g 2  . omeprazole (PRILOSEC) 20 MG capsule Take 20 mg by mouth 2 (two) times daily.    . sertraline (ZOLOFT) 50 MG tablet Take 25 mg by mouth daily.    Marland Kitchen tiotropium (SPIRIVA) 18 MCG inhalation capsule Place 1 capsule (18 mcg total) into inhaler and inhale daily. 90 capsule 2  . Vitamin D, Ergocalciferol, (DRISDOL) 50000 UNITS CAPS capsule Take 1 capsule (50,000 Units total) by mouth every 7 (seven) days. 12 capsule 4   No current facility-administered medications on file prior to visit.    Past Medical History  Diagnosis Date  . Cancer ([place])     lung ca  . COPD (chronic obstructive pulmonary disease) ([place])   . Dyspnea   . Rheumatoid arthritis(714.0)   . GERD (gastroesophageal reflux disease)   . Thyroid disease   . Hypercholesteremia   . Vitamin D deficiency   . 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 Memory difficulties 10/28/2015  . Uterine prolapse     Past Surgical History  Procedure Laterality Date  . Right lobectomy  10/2004  . Appendectomy    . Tonsillectomy and adenoidectomy    . Hemorrhoid surgery      Social History   Social History  . Marital Status: Married    Spouse Name: N/A  . Number of Children: 3  . Years of Education: LPN   Occupational History  . retired     Corporate treasurer at [employer] Topics  . Smoking status: Former Smoker -- 1.00 packs/day for 20 years    Types: Cigarettes    Quit date: 11/10/2004  . Smokeless tobacco: Never Used  . Alcohol Use: 0.0 oz/week    0 Standard drinks or equivalent per week     Comment: socially  . Drug Use: No  . Sexual Activity: Not on file   Other Topics Concern  . Not on file   Social History Narrative   Patient drinks about 3 cups of coffee daily.    Patient is right handed.     Family History  Problem Relation Age of Onset  . Emphysema Mother   . Asthma Mother   . Lung cancer Father   . Pancreatic cancer Father   . Bone cancer Father   . Heart disease Brother     x1  . Heart disease Sister     x2  . Stroke Maternal Grandfather     Review of Systems  Constitutional: Positive for fatigue. Negative for fever and chills.  Respiratory: Positive for shortness of breath. Negative for cough and  wheezing.   Cardiovascular: Positive for chest pain (has been told it was related to COPD). Negative for palpitations and leg swelling.  Gastrointestinal: Positive for abdominal pain (epigastric region - has hiatal hernia) and constipation.       Gerd frequently  Genitourinary: Negative for dysuria and hematuria.  Musculoskeletal: Positive for back pain and arthralgias.  Neurological: Positive for headaches (at night, MRI by neuro).  Psychiatric/Behavioral: Positive for dysphoric mood. The patient is nervous/anxious.        Objective:   Filed Vitals:   05/06/16 1304  BP: 136/84  Pulse: 72  Temp: 98.2 F (36.8 C)  Resp: 16   Filed Weights   05/06/16 1304  Weight: 163 lb (73.936 kg)   Body mass index is 27.97 kg/(m^2).   Physical Exam Constitutional: She appears well-developed and well-nourished. No distress.  HENT:  Head: Normocephalic and atraumatic.  Right Ear: External ear normal. Normal ear canal and TM Left Ear: External ear normal.  Normal ear canal and TM Mouth/Throat: Oropharynx is clear and moist.  Eyes: Conjunctivae normal. right tear duct - with scab overlying - no obvious redness or swelling Neck: Neck supple. No tracheal deviation present. No thyromegaly present.  No carotid bruit  Cardiovascular: Normal rate, regular rhythm and normal heart sounds.   No murmur heard.  No edema. Pulmonary/Chest: Effort normal and breath sounds normal. No respiratory distress. She has no wheezes. She has no rales.  Abdominal:  Soft. She exhibits no distension. There is no tenderness.  Lymphadenopathy: She has no cervical adenopathy.  Skin: Skin is warm and dry. She is not diaphoretic.  Psychiatric: She has a normal mood and affect. Her behavior is normal.         Assessment & Plan:   History of lung cancer, COPD: Following with pulmonary No evidence of recurrence of lung cancer Uses inhalers daily as prescribed COPD well-controlled  See Problem List for Assessment and Plan of chronic medical problems.  F/u in 6 months

## 2016-05-06 NOTE — Assessment & Plan Note (Addendum)
Managed by gyn dexa up to date Currently on high-dose vitamin D

## 2016-05-06 NOTE — Assessment & Plan Note (Signed)
Check A1c She would like to lose weight She does not always cook for herself and is not eating as healthy as she should because she does live alone Stressed the importance of regular exercise

## 2016-05-06 NOTE — Patient Instructions (Signed)
  Test(s) ordered today. Your results will be released to [facility] (or called to you) after review, usually within 72hours after test completion. If any changes need to be made, you will be notified at that same time.   Medications reviewed and updated.  Changes include discontinuing the omeprazole and starting pantoprazole 40 mg daily.  If your heartburn is still not controled start taking pepcid or zantac nightly.  Your prescription(s) have been submitted to your pharmacy. Please take as directed and contact our office if you believe you are having problem(s) with the medication(s).   Please followup in 6 months

## 2016-05-06 NOTE — Assessment & Plan Note (Signed)
Taking sertraline 25 mg daily Overall fairly controlled Uses valium only as needed

## 2016-05-06 NOTE — Assessment & Plan Note (Signed)
Check tsh -this may be the reason for her fatigue Titrate med dose if needed

## 2016-05-06 NOTE — Assessment & Plan Note (Signed)
Following with rheumatology Methotrexate Arthritis pain is controlled. She has deformities in her hands and feet that occur prior to her starting medication

## 2016-05-07 ENCOUNTER — Encounter: Payer: Self-pay | Admitting: Emergency Medicine

## 2016-05-11 DIAGNOSIS — M25562 Pain in left knee: Secondary | ICD-10-CM | POA: Diagnosis not present

## 2016-05-11 DIAGNOSIS — M25511 Pain in right shoulder: Secondary | ICD-10-CM | POA: Diagnosis not present

## 2016-05-11 DIAGNOSIS — M0589 Other rheumatoid arthritis with rheumatoid factor of multiple sites: Secondary | ICD-10-CM | POA: Diagnosis not present

## 2016-05-11 DIAGNOSIS — M25561 Pain in right knee: Secondary | ICD-10-CM | POA: Diagnosis not present

## 2016-05-11 DIAGNOSIS — M25512 Pain in left shoulder: Secondary | ICD-10-CM | POA: Diagnosis not present

## 2016-05-11 DIAGNOSIS — Z79899 Other long term (current) drug therapy: Secondary | ICD-10-CM | POA: Diagnosis not present

## 2016-05-26 ENCOUNTER — Telehealth: Payer: Self-pay | Admitting: Neurology

## 2016-05-26 NOTE — Telephone Encounter (Signed)
Pt called sts the tremor in jaw and tongue got worse about the beginning of May, she notices it gets worse when she is upset, nervous. She started the gabapentin '100mg'$  2 x day the beginning of May. Sts she was driving today and her teeth was chattering, said it was hard to control.

## 2016-05-27 NOTE — Telephone Encounter (Signed)
I called patient. The patient is having some jaw tremor. She is on gabapentin 100 mg twice daily. We certainly can go up on the dose to 3 times a day if she can tolerate it. She will call me in a couple weeks to let me know how she is doing. She is unable to tolerate the diazepam.

## 2016-06-10 DIAGNOSIS — H01024 Squamous blepharitis left upper eyelid: Secondary | ICD-10-CM | POA: Diagnosis not present

## 2016-06-10 DIAGNOSIS — H01021 Squamous blepharitis right upper eyelid: Secondary | ICD-10-CM | POA: Diagnosis not present

## 2016-07-01 ENCOUNTER — Other Ambulatory Visit: Payer: Self-pay

## 2016-07-01 DIAGNOSIS — N952 Postmenopausal atrophic vaginitis: Secondary | ICD-10-CM

## 2016-07-01 MED ORDER — NONFORMULARY OR COMPOUNDED ITEM: Status: DC

## 2016-07-02 ENCOUNTER — Other Ambulatory Visit: Payer: Self-pay | Admitting: Pulmonary Disease

## 2016-07-03 ENCOUNTER — Encounter: Payer: Self-pay | Admitting: Gynecology

## 2016-07-03 ENCOUNTER — Ambulatory Visit (INDEPENDENT_AMBULATORY_CARE_PROVIDER_SITE_OTHER): Payer: Medicare Other | Admitting: Gynecology

## 2016-07-03 VITALS — BP 122/70

## 2016-07-03 DIAGNOSIS — Z4689 Encounter for fitting and adjustment of other specified devices: Secondary | ICD-10-CM

## 2016-07-03 DIAGNOSIS — N819 Female genital prolapse, unspecified: Secondary | ICD-10-CM | POA: Diagnosis not present

## 2016-07-03 NOTE — Progress Notes (Signed)
   HPI: Patient presented to the office for pessary maintenance.Patient with known history of uterine prolapse along with cystocele and rectocele and history of urinary incontinence.Patient with multiple comorbidities not surgical candidate see past medical history list. The patient has had history of lung cancer and lobectomy. She also has history of COPD. The patient is applying vaginal estrogen twice a week and has been doing well.She has done well with a ring pessary.    ROS: A ROS was performed and pertinent positives and negatives are included in the history.  GENERAL: No fevers or chills. HEENT: No change in vision, no earache, sore throat or sinus congestion. NECK: No pain or stiffness. CARDIOVASCULAR: No chest pain or pressure. No palpitations. PULMONARY: No shortness of breath, cough or wheeze. GASTROINTESTINAL: No abdominal pain, nausea, vomiting or diarrhea, melena or bright red blood per rectum. GENITOURINARY: No urinary frequency, urgency, hesitancy or dysuria. MUSCULOSKELETAL: No joint or muscle pain, no back pain, no recent trauma. DERMATOLOGIC: No rash, no itching, no lesions. ENDOCRINE: No polyuria, polydipsia, no heat or cold intolerance. No recent change in weight. HEMATOLOGICAL: No anemia or easy bruising or bleeding. NEUROLOGIC: No headache, seizures, numbness, tingling or weakness. PSYCHIATRIC: No depression, no loss of interest in normal activity or change in sleep pattern.   PE: Blood pressure 122/70 Pelvic exam: Patient with small paper cut like area above the clitoris due to atrophy Bartholin urethra Skene glands with atrophic changes Vagina: No gross lesions or discharge, atrophic changes, second-degree cystocele and second-degree rectocele and first degree uterine descensus  Cervix: No gross lesions or discharge  Uterus axial, normal size, shape and consistency, non-tender and mobile  Adnexa Without masses or tenderness  The pessary was cleaned and  replaced.    Assessment Plan: Patient with pelvic organ prolapse comes to the office every 3 months for pessary maintenance because of her severe arthritis of her hands. Patient is done well reports no vaginal bleeding. She will return back in 3 months.    Greater than 50% of time was spent in counseling and coordinating care of this patient.   Time of consultation: 15   Minutes.

## 2016-07-08 DIAGNOSIS — H1851 Endothelial corneal dystrophy: Secondary | ICD-10-CM | POA: Diagnosis not present

## 2016-07-08 DIAGNOSIS — Z961 Presence of intraocular lens: Secondary | ICD-10-CM | POA: Diagnosis not present

## 2016-07-08 DIAGNOSIS — M069 Rheumatoid arthritis, unspecified: Secondary | ICD-10-CM | POA: Diagnosis not present

## 2016-07-08 DIAGNOSIS — G43109 Migraine with aura, not intractable, without status migrainosus: Secondary | ICD-10-CM | POA: Diagnosis not present

## 2016-07-15 DIAGNOSIS — M069 Rheumatoid arthritis, unspecified: Secondary | ICD-10-CM | POA: Diagnosis not present

## 2016-07-27 DIAGNOSIS — J449 Chronic obstructive pulmonary disease, unspecified: Secondary | ICD-10-CM | POA: Diagnosis not present

## 2016-07-27 DIAGNOSIS — M0579 Rheumatoid arthritis with rheumatoid factor of multiple sites without organ or systems involvement: Secondary | ICD-10-CM | POA: Diagnosis not present

## 2016-07-27 DIAGNOSIS — M2550 Pain in unspecified joint: Secondary | ICD-10-CM | POA: Diagnosis not present

## 2016-07-27 DIAGNOSIS — Z79899 Other long term (current) drug therapy: Secondary | ICD-10-CM | POA: Diagnosis not present

## 2016-08-17 DIAGNOSIS — Z79891 Long term (current) use of opiate analgesic: Secondary | ICD-10-CM | POA: Diagnosis not present

## 2016-08-17 DIAGNOSIS — M1712 Unilateral primary osteoarthritis, left knee: Secondary | ICD-10-CM | POA: Diagnosis not present

## 2016-08-17 DIAGNOSIS — G894 Chronic pain syndrome: Secondary | ICD-10-CM | POA: Diagnosis not present

## 2016-08-17 DIAGNOSIS — M25562 Pain in left knee: Secondary | ICD-10-CM | POA: Diagnosis not present

## 2016-09-03 DIAGNOSIS — N8110 Cystocele, unspecified: Secondary | ICD-10-CM | POA: Diagnosis not present

## 2016-09-03 DIAGNOSIS — B373 Candidiasis of vulva and vagina: Secondary | ICD-10-CM | POA: Diagnosis not present

## 2016-09-03 DIAGNOSIS — R8271 Bacteriuria: Secondary | ICD-10-CM | POA: Diagnosis not present

## 2016-10-03 ENCOUNTER — Other Ambulatory Visit: Payer: Self-pay | Admitting: Internal Medicine

## 2016-10-08 ENCOUNTER — Ambulatory Visit: Payer: Medicare Other | Admitting: Internal Medicine

## 2016-10-09 ENCOUNTER — Ambulatory Visit: Payer: Medicare Other | Admitting: Adult Health

## 2016-10-14 ENCOUNTER — Encounter: Payer: Self-pay | Admitting: Gynecology

## 2016-10-14 ENCOUNTER — Ambulatory Visit (INDEPENDENT_AMBULATORY_CARE_PROVIDER_SITE_OTHER): Payer: Medicare Other | Admitting: Gynecology

## 2016-10-14 VITALS — BP 124/74 | Ht 62.0 in | Wt 156.0 lb

## 2016-10-14 DIAGNOSIS — M810 Age-related osteoporosis without current pathological fracture: Secondary | ICD-10-CM | POA: Insufficient documentation

## 2016-10-14 DIAGNOSIS — Z4689 Encounter for fitting and adjustment of other specified devices: Secondary | ICD-10-CM | POA: Diagnosis not present

## 2016-10-14 DIAGNOSIS — M8580 Other specified disorders of bone density and structure, unspecified site: Secondary | ICD-10-CM

## 2016-10-14 DIAGNOSIS — N816 Rectocele: Secondary | ICD-10-CM

## 2016-10-14 DIAGNOSIS — N814 Uterovaginal prolapse, unspecified: Secondary | ICD-10-CM | POA: Diagnosis not present

## 2016-10-14 DIAGNOSIS — Z01411 Encounter for gynecological examination (general) (routine) with abnormal findings: Secondary | ICD-10-CM | POA: Diagnosis not present

## 2016-10-14 DIAGNOSIS — N8111 Cystocele, midline: Secondary | ICD-10-CM

## 2016-10-14 DIAGNOSIS — Z8639 Personal history of other endocrine, nutritional and metabolic disease: Secondary | ICD-10-CM

## 2016-10-14 MED ORDER — ALENDRONATE SODIUM 70 MG PO TABS: 70.0000 mg | ORAL_TABLET | ORAL | 11 refills | Status: DC

## 2016-10-14 MED ORDER — VITAMIN D (ERGOCALCIFEROL) 1.25 MG (50000 UNIT) PO CAPS: ORAL_CAPSULE | ORAL | 5 refills | Status: DC

## 2016-10-14 NOTE — Patient Instructions (Signed)
Alendronate effervescent oral tablets What is this medicine? ALENDRONATE (a LEN droe nate) slows calcium loss from bones. It helps to make healthy bone and to slow bone loss in people with osteoporosis. This medicine may be used for other purposes; ask your health care provider or pharmacist if you have questions. What should I tell my health care provider before I take this medicine? They need to know if you have any of these conditions: -esophagus, stomach, or intestine problems, like acid-reflux or GERD -dental disease -heart disease -high blood pressure -kidney disease -low blood calcium -low vitamin D -problems swallowing -problems sitting or standing for 30 minutes -an unusual or allergic reaction to alendronate, other medicines, foods, dyes, or preservatives -pregnant or trying to get pregnant -breast-feeding How should I use this medicine? You must take this medicine exactly as directed or you will lower the amount of medicine you absorb into your body or you may cause yourself harm. Take your dose by mouth first thing in the morning, after you are up for the day. Do not eat or drink anything before you take this medicine. Dissolve your medicine in a glass (4 fluid ounces) of plain, room temperature water. Do not take this tablet with any other drink. Wait at least 5 minutes after the medicine has dissolved and then stir for 10 seconds and drink. After taking this medicine, do not eat breakfast, drink, or take any medicines or vitamins for at least 30 minutes. Stand or sit up for at least 30 minutes after you take this medicine; do not lie down. Take this medicine on the same day every week. Do not take your medicine more often than directed. Talk to your pediatrician regarding the use of this medicine in children. Special care may be needed. Overdosage: If you think you have taken too much of this medicine contact a poison control center or emergency room at once. NOTE: This medicine  is only for you. Do not share this medicine with others. What if I miss a dose? If you miss a dose, take the dose on the morning after you remember. Then take your next dose on your regular day of the week. Never take 2 tablets on the same day. Do not take double or extra doses. What may interact with this medicine? -aluminum hydroxide -antacids -aspirin -calcium supplements -drugs for inflammation like ibuprofen, naproxen, and others -iron supplements -magnesium supplements -vitamins with minerals This list may not describe all possible interactions. Give your health care provider a list of all the medicines, herbs, non-prescription drugs, or dietary supplements you use. Also tell them if you smoke, drink alcohol, or use illegal drugs. Some items may interact with your medicine. What should I watch for while using this medicine? Visit your doctor or health care professional for regular checks ups. It may be some time before you see benefit from this medicine. Do not stop taking your medication except on your doctor's advice. Your doctor or health care professional may order blood tests and other tests to see how you are doing. You should make sure you get enough calcium and vitamin D while you are taking this medicine, unless your doctor tells you not to. Discuss the foods you eat and the vitamins you take with your health care professional. Some people who take this medicine have severe bone, joint, and/or muscle pain. This medicine may also increase your risk for a broken thigh bone. Tell your doctor right away if you have pain in your upper leg  or groin. Tell your doctor if you have any pain that does not go away or that gets worse. This medicine can make you more sensitive to the sun. If you get a rash while taking this medicine, sunlight may cause the rash to get worse. Keep out of the sun. If you cannot avoid being in the sun, wear protective clothing and use sunscreen. Do not use sun lamps  or tanning beds/booths. What side effects may I notice from receiving this medicine? Side effects that you should report to your doctor or health care professional as soon as possible: -allergic reactions like skin rash, itching or hives, swelling of the face, lips, or tongue -black or tarry stools -bone, muscle or joint pain -changes in vision -chest pain -heartburn or stomach pain -jaw pain, especially after dental work -pain or trouble when swallowing -redness, blistering, peeling or loosening of the skin, including inside the mouth Side effects that usually do not require medical attention (Report these to your doctor or health care professional if they continue or are bothersome.): -changes in taste -diarrhea or constipation -eye pain or itching -headache -nausea or vomiting -stomach gas or fullness This list may not describe all possible side effects. Call your doctor for medical advice about side effects. You may report side effects to FDA at 1-800-FDA-1088. Where should I keep my medicine? Keep out of the reach of children. Store between 20 and 25 degrees C (68 and 77 degrees F). Keep this medicine in the original container. Throw away any unused medicine after the expiration date. NOTE: This sheet is a summary. It may not cover all possible information. If you have questions about this medicine, talk to your doctor, pharmacist, or health care provider.    2016, [employer]vier/[employer]. (2011-10-29 09:07:01)

## 2016-10-14 NOTE — Progress Notes (Addendum)
Lisa Vega 1932-07-05 [id number]   History:    81 y.o.  presented to the office for her annual exam. Patient was complaining of tiredness and fatigue. Patient with history of uterine prolapse is here also for pessary maintenance. Patient has a history of cystocele rectocele as well as urinary incontinence and the pessary has helped her symptoms.The patient has had history of lung cancer and lobectomy. She also has history of COPD. patient had colonoscopy and EGD by her gastroenterologist this year and she states that benign polyps were removed. Patient was seen by urologist recently as a result of recurrent urinary tract infections and she stated that a CT scan was done and no kidney stones were noted but that she is currently on Macrobid 100 mg daily for prophylaxis. Patient's PCP has been doing her blood work.  Review of her record indicated that last year she was started on alendronate (Fosamax) 70 mg every weekly as a result of her bone density study which had demonstrated that her lowest T score was at the left femoral neck with a value -2.2. Her FRAX analysis placed her in a high risk category for fracture as follows: 10 year fracture risk of the hip 20% (threshold is 3%) risk of any major osteoporotic fractures 37% over the course of the next 10 years (threshold 20%).  Patient has several risk factors for fracture: 1. Postmenopausal 2. Vitamin D deficiency 3. 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 Past history of smoking history of COPD 5. Rheumatoid arthritis 6. Chronic steroid usage 7. History of non-small cell cancer of the lung  For this reason the patient was started on the antiresorptive agent. Patient has had history vitamin D deficiency and had been instructed take vitamin D 50,000 units every other week indefinitely she has not done so she ran out of the medication. She applies estrogen vaginal cream twice a week to help with her vaginal dryness and for  lubrication of her pessary. She comes every 3 months for pessary maintenance.   Past medical history,surgical history, family history and social history were all reviewed and documented in the EPIC chart.  Gynecologic History No LMP recorded. Patient is postmenopausal. Contraception: post menopausal status Last Pap: 2005. Results were: normal Last mammogram: 2014. Results were: normal  Obstetric History OB History  Gravida Para Term Preterm AB Living  '5 3     2 3  '$ SAB TAB Ectopic Multiple Live Births  1   1        # Outcome Date GA Lbr Len/2nd Weight Sex Delivery Anes PTL Lv  5 Ectopic           4 SAB           3 Para           2 Para           1 Para                ROS: A ROS was performed and pertinent positives and negatives are included in the history.  GENERAL: No fevers or chills. HEENT: No change in vision, no earache, sore throat or sinus congestion. NECK: No pain or stiffness. CARDIOVASCULAR: No chest pain or pressure. No palpitations. PULMONARY: No shortness of breath, cough or wheeze. GASTROINTESTINAL: No abdominal pain, nausea, vomiting or diarrhea, melena or bright red blood per rectum. GENITOURINARY: No urinary frequency, urgency, hesitancy or dysuria. MUSCULOSKELETAL: No joint or muscle pain, no back pain, no recent trauma.  DERMATOLOGIC: No rash, no itching, no lesions. ENDOCRINE: No polyuria, polydipsia, no heat or cold intolerance. No recent change in weight. HEMATOLOGICAL: No anemia or easy bruising or bleeding. NEUROLOGIC: No headache, seizures, numbness, tingling or weakness. PSYCHIATRIC: No depression, no loss of interest in normal activity or change in sleep pattern.     Exam: chaperone present  BP 124/74   Ht '5\' 2"'$  (1.575 m)   Wt 156 lb (70.8 kg)   BMI 28.53 kg/m   Body mass index is 28.53 kg/m.  General appearance : Well developed well nourished female. No acute distress HEENT: Eyes: no retinal hemorrhage or exudates,  Neck supple, trachea midline,  no carotid bruits, no thyroidmegaly Lungs: Clear to auscultation, no rhonchi or wheezes, or rib retractions  Heart: Regular rate and rhythm, no murmurs or gallops Breast:Examined in sitting and supine position were symmetrical in appearance, no palpable masses or tenderness,  no skin retraction, no nipple inversion, no nipple discharge, no skin discoloration, no axillary or supraclavicular lymphadenopathy Abdomen: no palpable masses or tenderness, no rebound or guarding Extremities: no edema or skin discoloration or tenderness  Pelvic: Bartholin, Urethra, Skene Glands: Within normal limits             Vagina: Atrophic changes, second-degree cystocele, second-degree rectocele, first-degree uterine descensus             Cervix: No gross lesions or discharge             Uterus  normal size shape and consistency and nontender, and mobile             Adnexa  Without masses or tenderness             Anus and perineum  normal              Rectovaginal  normal sphincter tone without palpated masses or tenderness             Hemoccult PCP provides    Assessment/Plan:  82 y.o. female for annual exam with past history of osteopenia by high-risk for fracture based on Frax analysis was recommended that once again she take her Fosamax weekly prescription provided. Also because of vitamin D deficiency is recommended she take 50,000 units weekly for 12 weeks and then begin taking 50,000 units every other week. Because of her tiredness and fatigue and check her vitamin D level. Her PCP we'll be checking her blood work the next few weeks. Her pessary was cleaned and replaced back into the vagina. She goes through pessary maintenance because of her severely deformed hands and it makes it difficult for her to clean her pessary so she comes every 3 months. She'll receive her flu vaccine at her PCP office next week. Pap smear not indicated.   Terrance Mass MD, 2:50 PM 10/14/2016

## 2016-10-15 LAB — VITAMIN D 25 HYDROXY (VIT D DEFICIENCY, FRACTURES): Vit D, 25-Hydroxy: 20 ng/mL — ABNORMAL LOW (ref 30–100)

## 2016-10-19 ENCOUNTER — Encounter: Payer: Self-pay | Admitting: Family Medicine

## 2016-10-19 ENCOUNTER — Other Ambulatory Visit (HOSPITAL_COMMUNITY)
Admission: RE | Admit: 2016-10-19 | Discharge: 2016-10-19 | Disposition: A | Discharge status: Home / Self Care | Payer: Medicare Other | Source: Ambulatory Visit | Attending: Family Medicine | Admitting: Family Medicine

## 2016-10-19 ENCOUNTER — Ambulatory Visit (INDEPENDENT_AMBULATORY_CARE_PROVIDER_SITE_OTHER): Payer: Medicare Other | Admitting: Family Medicine

## 2016-10-19 VITALS — BP 122/76 | HR 75 | Temp 98.6°F | Resp 17 | Ht 62.0 in | Wt 156.2 lb

## 2016-10-19 DIAGNOSIS — R300 Dysuria: Secondary | ICD-10-CM

## 2016-10-19 DIAGNOSIS — N760 Acute vaginitis: Secondary | ICD-10-CM | POA: Diagnosis not present

## 2016-10-19 LAB — POCT URINALYSIS DIPSTICK
Bilirubin, UA: NEGATIVE
Blood, UA: NEGATIVE
Glucose, UA: NEGATIVE
Ketones, UA: NEGATIVE
Leukocytes, UA: NEGATIVE
Nitrite, UA: NEGATIVE
Protein, UA: NEGATIVE
Spec Grav, UA: 1.015
Urobilinogen, UA: 0.2
pH, UA: 5.5

## 2016-10-19 MED ORDER — FLUCONAZOLE 150 MG PO TABS: 150.0000 mg | ORAL_TABLET | Freq: Once | ORAL | 0 refills | Status: AC

## 2016-10-19 NOTE — Progress Notes (Signed)
Pre visit review using our clinic review tool, if applicable. No additional management support is needed unless otherwise documented below in the visit note. 

## 2016-10-19 NOTE — Progress Notes (Signed)
   Subjective:    Patient ID: Lisa Vega, female    DOB: 03/18/1932, 82 y.o.   MRN: [id number]  HPI Dysuria- 'it's real uncomfortable on the outside'.  sxs started on Thursday.  She took left over Diflucan on Thursday.  Now having burning w/ urination.  Pt has pessary in place.  Pt frequently has these sxs.  Pt denies increased frequency but burning w/ and after urination.  No blood in urine.  No N/V.  No fevers.      Review of Systems For ROS see HPI     Objective:   Physical Exam  Constitutional: She appears well-developed and well-nourished. No distress.  Abdominal: Soft. She exhibits no distension. There is no tenderness (no suprapubic or CVA tenderness).  Vitals reviewed.         Assessment & Plan:  Dysuria- new to provider, recurrent issue for pt.  UA is unrevealing today and I reviewed the last 3 urine cultures when she had similar sxs and those were all negative as well.  Pt is describing external irritation consistent w/ yeast and the burning that occurs from voiding over irritated areas.  Start Diflucan.  Hold on abx until culture results available.  Pt expressed understanding and is in agreement w/ plan.

## 2016-10-19 NOTE — Patient Instructions (Signed)
Follow up as needed We'll notify you of your urine culture results and make any changes if needed Take the Diflucan today as directed and repeat in 3 days if needed Drink plenty of fluids Call with any questions or concerns Hang in there!!!

## 2016-10-20 LAB — URINE CULTURE

## 2016-10-21 NOTE — Progress Notes (Signed)
Called pt and lmovm to return call.

## 2016-10-26 LAB — URINE CYTOLOGY ANCILLARY ONLY: Candida vaginitis: NEGATIVE

## 2016-11-06 ENCOUNTER — Ambulatory Visit: Payer: Medicare Other | Admitting: Internal Medicine

## 2016-11-08 NOTE — Progress Notes (Signed)
Subjective:    Patient ID: Lisa Vega, female    DOB: [birth year], 82 y.o.   MRN: [id number]  HPI The patient is here for follow up.  She has no energy.  She has difficulty getting to sleep.  She takes valium on a rare occasion, but it causes bad dreams so she does not take it often.  She does not want to take any sleep medication.  Her right ear rings or beeps at times.  She denies ear pain.   Prediabetes:  She is compliant with a low sugar/carbohydrate diet.  She is not exercising regularly.  GERD:  She is taking her medication daily as prescribed.  She feels GERD symptoms 3-4 times a week.  She takes tums daily.    Hypothyroidism:  She is taking her medication daily.  She feels her fatigue is not related to her thyroid.   Depression, anxiety: She is taking her medication daily as prescribed. She denies any side effects from the medication. She feels her depression and anxiety ? Controlled.  She does not sleep well.  She thinks her head may race, but is not sure.     She has follow up with pulmonary tomorrow.   Her BP is elevated today - she feels it is from rushing here.    Medications and allergies reviewed with patient and updated if appropriate.  Patient Active Problem List   Diagnosis Date Noted  . Age-related osteoporosis without current pathological fracture 10/14/2016  . Osteoporosis 05/06/2016  . Depression with anxiety 05/06/2016  . Hiatal hernia 05/06/2016  . Hypothyroidism 05/06/2016  . Prediabetes 05/06/2016  . Tremor 10/28/2015  . Memory difficulties 10/28/2015  . Hoarseness 10/14/2015  . History of vitamin D deficiency 05/02/2015  . Other constipation 01/29/2015  . Abdominal pain, chronic, epigastric 11/01/2014  . 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 ([place]) 05/30/2014  . Diverticulitis of colon without hemorrhage 08/07/2013  . Prolapse of female pelvic organs 06/22/2013  . Neck pain 03/10/2013  . Vaginal atrophy 03/09/2013  . Rectocele  03/09/2013  . Cystocele 03/09/2013  . Female pelvic pain 03/09/2013  . Urinary incontinence 03/09/2013  . COPD (chronic obstructive pulmonary disease) ([place]) 01/16/2011  . CARCINOMA, LUNG, NONSMALL CELL 12/17/2010  . Rheumatoid arthritis ([place]) 12/17/2010  . DYSPNEA 12/17/2010    Current Outpatient Prescriptions on File Prior to Visit  Medication Sig Dispense Refill  . acetaminophen (TYLENOL) 325 MG tablet Take 650 mg by mouth every 6 (six) hours as needed.    . folic acid (FOLVITE) 1 MG tablet Take 1 mg by mouth daily.    Marland Kitchen gabapentin (NEURONTIN) 100 MG capsule Take 100 mg by mouth 3 (three) times daily.     Marland Kitchen loratadine (CLARITIN) 10 MG tablet Take 10 mg by mouth daily.    . methotrexate 25 MG/ML SOLN Inject 8.08 mg into the skin once a week.     . NONFORMULARY OR COMPOUNDED ITEM Estradiol .02% 1 ML Prefilled Applicator Sig: apply vaginally twice a week #90 Day Supply with 4 refills 1 each 3  . nystatin-triamcinolone (MYCOLOG II) cream Apply 1 application topically 3 (three) times daily. 30 g 2  . PROAIR HFA 108 (90 Base) MCG/ACT inhaler USE 1-2 PUFFS EVERY 4-6 HOURS AS NEEDED 8.5 g 1  . SYMBICORT 160-4.5 MCG/ACT inhaler USE 2 INHALATIONS ORALLY   TWICE DAILY 30.6 g 1  . tiotropium (SPIRIVA) 18 MCG inhalation capsule Place 1 capsule (18 mcg total) into inhaler and inhale daily. 90 capsule  2  . Vitamin D, Ergocalciferol, (DRISDOL) 50000 units CAPS capsule Take one weekly for 12 weeks then begin one every other week 30 capsule 5   No current facility-administered medications on file prior to visit.     Past Medical History:  Diagnosis Date  . Cancer ([place])    lung ca  . COPD (chronic obstructive pulmonary disease) ([place])   . Dyspnea   . GERD (gastroesophageal reflux disease)   . Hypercholesteremia   . Memory difficulties 10/28/2015  . Rheumatoid arthritis(714.0)   . Thyroid disease   . Tremor 10/28/2015   Jaw tremor  . Uterine prolapse   . Vitamin D deficiency     Past  Surgical History:  Procedure Laterality Date  . APPENDECTOMY    . HEMORRHOID SURGERY    . right lobectomy  10/2004  . TONSILLECTOMY AND ADENOIDECTOMY      Social History   Social History  . Marital status: Married    Spouse name: N/A  . Number of children: 3  . Years of education: LPN   Occupational History  . 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 Smoking status: Former Smoker    Packs/day: 1.00    Years: 20.00    Types: Cigarettes    Quit date: 11/10/2004  . Smokeless tobacco: Never Used  . Alcohol use 0.0 oz/week     Comment: socially  . Drug use: No  . Sexual activity: Not on file   Other Topics Concern  . Not on file   Social History Narrative   Patient drinks about 3 cups of coffee daily.   Patient is right handed.     Family History  Problem Relation Age of Onset  . Emphysema Mother   . Asthma Mother   . Lung cancer Father   . Pancreatic cancer Father   . Bone cancer Father   . Heart disease Brother     x1  . Heart disease Sister     x2  . Stroke Maternal Grandfather     Review of Systems  Constitutional: Negative for fever.  Respiratory: Positive for shortness of breath (chronic, at baseline). Negative for cough and wheezing.   Cardiovascular: Negative for chest pain, palpitations and leg swelling.  Gastrointestinal: Positive for abdominal pain (chronic, w/u negative).       Gerd   Neurological: Positive for light-headedness (occ) and headaches (occ).  Psychiatric/Behavioral: Positive for sleep disturbance.       Objective:   Vitals:   11/09/16 1422  BP: (!) 156/76  Pulse: 65  Resp: 16  Temp: 98.1 F (36.7 C)   Filed Weights   11/09/16 1422  Weight: 157 lb (71.2 kg)   Body mass index is 28.72 kg/m.   Physical Exam    Constitutional: Appears well-developed and well-nourished. No distress.  HENT:  Head: Normocephalic and atraumatic.  Neck: Neck supple. No tracheal deviation  present. No thyromegaly present.  No cervical lymphadenopathy Cardiovascular: Normal rate, regular rhythm and normal heart sounds.   No murmur heard. No carotid bruit .  No edema Pulmonary/Chest: Effort normal and breath sounds normal. No respiratory distress. No has no wheezes. No rales.  Skin: Skin is warm and dry. Not diaphoretic.  Psychiatric: Normal mood and affect. Behavior is normal.      Assessment & Plan:    See Problem List for Assessment and Plan of chronic medical problems.

## 2016-11-08 NOTE — Patient Instructions (Addendum)
  Test(s) ordered today. Your results will be released to [facility] (or called to you) after review, usually within 72hours after test completion. If any changes need to be made, you will be notified at that same time.  All other Health Maintenance issues reviewed.   All recommended immunizations and age-appropriate screenings are up-to-date or discussed.  No immunizations administered today.   Medications reviewed and updated.  Changes include: Stop the omeprazole (prilosec) and start protonix (pantoprazole) daily - before breakfast.  Take zantac (ranitidine) at bedtime or in evening daily and then as needed once heartburn is controlled you can take as needed.   Increase zoloft to 50 mg daily - take at night.   Your prescription(s) have been submitted to your pharmacy. Please take as directed and contact our office if you believe you are having problem(s) with the medication(s).   Please followup in 6 months

## 2016-11-09 ENCOUNTER — Other Ambulatory Visit (INDEPENDENT_AMBULATORY_CARE_PROVIDER_SITE_OTHER): Payer: Medicare Other

## 2016-11-09 ENCOUNTER — Encounter: Payer: Self-pay | Admitting: Internal Medicine

## 2016-11-09 ENCOUNTER — Ambulatory Visit (INDEPENDENT_AMBULATORY_CARE_PROVIDER_SITE_OTHER): Payer: Medicare Other | Admitting: Internal Medicine

## 2016-11-09 VITALS — BP 156/76 | HR 65 | Temp 98.1°F | Resp 16 | Ht 62.0 in | Wt 157.0 lb

## 2016-11-09 DIAGNOSIS — K219 Gastro-esophageal reflux disease without esophagitis: Secondary | ICD-10-CM | POA: Diagnosis not present

## 2016-11-09 DIAGNOSIS — F418 Other specified anxiety disorders: Secondary | ICD-10-CM | POA: Diagnosis not present

## 2016-11-09 DIAGNOSIS — E038 Other specified hypothyroidism: Secondary | ICD-10-CM | POA: Diagnosis not present

## 2016-11-09 DIAGNOSIS — R7303 Prediabetes: Secondary | ICD-10-CM | POA: Diagnosis not present

## 2016-11-09 LAB — COMPREHENSIVE METABOLIC PANEL
ALT: 13 U/L (ref 0–35)
AST: 19 U/L (ref 0–37)
Albumin: 4.1 g/dL (ref 3.5–5.2)
Alkaline Phosphatase: 78 U/L (ref 39–117)
BUN: 12 mg/dL (ref 6–23)
CO2: 31 mEq/L (ref 19–32)
Calcium: 9.5 mg/dL (ref 8.4–10.5)
Chloride: 103 mEq/L (ref 96–112)
Creatinine, Ser: 0.81 mg/dL (ref 0.40–1.20)
GFR: 71.93 mL/min (ref >60.00)
Glucose, Bld: 96 mg/dL (ref 70–99)
Potassium: 3.8 mEq/L (ref 3.5–5.1)
Sodium: 140 mEq/L (ref 135–145)
Total Bilirubin: 0.5 mg/dL (ref 0.2–1.2)
Total Protein: 7.6 g/dL (ref 6.0–8.3)

## 2016-11-09 LAB — HEMOGLOBIN A1C: Hgb A1c MFr Bld: 6.1 % (ref 4.6–6.5)

## 2016-11-09 LAB — TSH: TSH: 0.86 u[IU]/mL (ref 0.35–4.50)

## 2016-11-09 MED ORDER — DIAZEPAM 5 MG PO TABS: 2.5000 mg | ORAL_TABLET | Freq: Two times a day (BID) | ORAL | 3 refills | Status: DC | PRN

## 2016-11-09 MED ORDER — RANITIDINE HCL 150 MG PO TABS: 150.0000 mg | ORAL_TABLET | Freq: Every evening | ORAL | 1 refills | Status: DC | PRN

## 2016-11-09 MED ORDER — PANTOPRAZOLE SODIUM 40 MG PO TBEC: 40.0000 mg | DELAYED_RELEASE_TABLET | Freq: Every day | ORAL | 3 refills | Status: DC

## 2016-11-09 MED ORDER — GLUCOSE BLOOD VI STRP: ORAL_STRIP | 3 refills | Status: DC

## 2016-11-09 MED ORDER — SERTRALINE HCL 50 MG PO TABS: 50.0000 mg | ORAL_TABLET | Freq: Every day | ORAL | 1 refills | Status: DC

## 2016-11-09 MED ORDER — LEVOTHYROXINE SODIUM 88 MCG PO TABS: 88.0000 ug | ORAL_TABLET | Freq: Every day | ORAL | 1 refills | Status: DC

## 2016-11-09 NOTE — Assessment & Plan Note (Signed)
Taking prilosec, which is not on her list (protonix is on her list)- GERD not controlled D/c omeprazole Start protonix 40 mg daily am Start zantac 150 mg at bedtime

## 2016-11-09 NOTE — Progress Notes (Signed)
Pre visit review using our clinic review tool, if applicable. No additional management support is needed unless otherwise documented below in the visit note. 

## 2016-11-09 NOTE — Assessment & Plan Note (Addendum)
?   Anxiety controlled Will increase sertraline to 50 mg daily and hopefully that will help her anxiety and depression  -some depression likely related to not feeling well/fatigue

## 2016-11-09 NOTE — Assessment & Plan Note (Signed)
Check tsh  Titrate med dose if needed  

## 2016-11-09 NOTE — Assessment & Plan Note (Signed)
Check a1c Ideally start regular exercise Low sugar/carb diet

## 2016-11-10 ENCOUNTER — Ambulatory Visit: Payer: Medicare Other | Admitting: Pulmonary Disease

## 2016-11-10 ENCOUNTER — Ambulatory Visit: Payer: Medicare Other

## 2016-11-17 ENCOUNTER — Other Ambulatory Visit: Payer: Self-pay | Admitting: Gynecology

## 2016-11-17 ENCOUNTER — Ambulatory Visit (INDEPENDENT_AMBULATORY_CARE_PROVIDER_SITE_OTHER): Payer: Medicare Other

## 2016-11-17 DIAGNOSIS — M899 Disorder of bone, unspecified: Secondary | ICD-10-CM

## 2016-11-17 DIAGNOSIS — E559 Vitamin D deficiency, unspecified: Secondary | ICD-10-CM

## 2016-11-17 DIAGNOSIS — Z8639 Personal history of other endocrine, nutritional and metabolic disease: Secondary | ICD-10-CM

## 2016-11-27 DIAGNOSIS — K219 Gastro-esophageal reflux disease without esophagitis: Secondary | ICD-10-CM | POA: Diagnosis not present

## 2016-11-27 DIAGNOSIS — K5901 Slow transit constipation: Secondary | ICD-10-CM | POA: Diagnosis not present

## 2016-12-25 ENCOUNTER — Ambulatory Visit (INDEPENDENT_AMBULATORY_CARE_PROVIDER_SITE_OTHER): Payer: Medicare Other

## 2016-12-25 ENCOUNTER — Encounter: Payer: Self-pay | Admitting: Podiatry

## 2016-12-25 ENCOUNTER — Ambulatory Visit (INDEPENDENT_AMBULATORY_CARE_PROVIDER_SITE_OTHER): Payer: Medicare Other | Admitting: Podiatry

## 2016-12-25 VITALS — BP 135/74 | HR 75

## 2016-12-25 DIAGNOSIS — L84 Corns and callosities: Secondary | ICD-10-CM

## 2016-12-25 DIAGNOSIS — M2010 Hallux valgus (acquired), unspecified foot: Secondary | ICD-10-CM

## 2016-12-25 DIAGNOSIS — M2040 Other hammer toe(s) (acquired), unspecified foot: Secondary | ICD-10-CM | POA: Diagnosis not present

## 2016-12-25 DIAGNOSIS — M216X9 Other acquired deformities of unspecified foot: Secondary | ICD-10-CM | POA: Diagnosis not present

## 2016-12-25 MED ORDER — CEPHALEXIN 500 MG PO CAPS: 500.0000 mg | ORAL_CAPSULE | Freq: Two times a day (BID) | ORAL | 1 refills | Status: DC

## 2016-12-25 NOTE — Progress Notes (Signed)
Subjective:     Patient ID: Lisa Vega, female   DOB: Jul 03, 1932, 82 y.o.   MRN: [id number]  HPI patient presents stating that she hasn't terrible calluses on the bottom of her right foot and her bunion right over left bothers her and she's had a history of surgery years ago which did not give her significant relief with problems with the second toe of both feet   Review of Systems  All other systems reviewed and are negative.      Objective:   Physical Exam  Constitutional: She is oriented to person, place, and time.  Cardiovascular: Intact distal pulses.   Musculoskeletal: Normal range of motion.  Neurological: She is oriented to person, place, and time.  Skin: Skin is warm.  Nursing note and vitals reviewed.  neurovascular status intact muscle strength adequate range of motion was within normal limits with some restriction in the forefoot bilateral. Patient's found severe forefoot derangement with structural bunion deformity bilateral severely shortened dorsiflexed second digit bilateral with severe plantar keratotic lesion second metatarsal right that's very painful when pressed. 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 Were to focus on the plantar callus first and hopefully be able to avoid surgery which may require Tretha Sciara type procedure. Today I went ahead and debrided the lesion fully applied padding and then scanned for customized soft orthotic devices to reduce pressure. Reappoint in 3 or 4 weeks and we'll see the results of trimming and we can decide if anything would be necessary surgically but I would like to be able to avoid this if possible  X-ray report indicate severe forefoot derangement bilateral with severe arthritis second MPJ  bilateral and structural bunion deformity

## 2016-12-26 ENCOUNTER — Other Ambulatory Visit: Payer: Self-pay | Admitting: Pulmonary Disease

## 2016-12-30 ENCOUNTER — Ambulatory Visit: Payer: Medicare Other | Admitting: Sports Medicine

## 2017-01-04 ENCOUNTER — Ambulatory Visit: Payer: Medicare Other | Admitting: Podiatry

## 2017-01-12 ENCOUNTER — Encounter: Payer: Self-pay | Admitting: Pulmonary Disease

## 2017-01-12 ENCOUNTER — Ambulatory Visit (INDEPENDENT_AMBULATORY_CARE_PROVIDER_SITE_OTHER): Payer: Medicare Other | Admitting: Pulmonary Disease

## 2017-01-12 ENCOUNTER — Telehealth: Payer: Self-pay | Admitting: Pulmonary Disease

## 2017-01-12 DIAGNOSIS — R0601 Orthopnea: Secondary | ICD-10-CM

## 2017-01-12 DIAGNOSIS — Z23 Encounter for immunization: Secondary | ICD-10-CM | POA: Diagnosis not present

## 2017-01-12 DIAGNOSIS — M1712 Unilateral primary osteoarthritis, left knee: Secondary | ICD-10-CM | POA: Diagnosis not present

## 2017-01-12 DIAGNOSIS — J438 Other emphysema: Secondary | ICD-10-CM

## 2017-01-12 MED ORDER — BUDESONIDE-FORMOTEROL FUMARATE 160-4.5 MCG/ACT IN AERO: INHALATION_SPRAY | RESPIRATORY_TRACT | 3 refills | Status: DC

## 2017-01-12 MED ORDER — TIOTROPIUM BROMIDE MONOHYDRATE 18 MCG IN CAPS: 18.0000 ug | ORAL_CAPSULE | Freq: Every day | RESPIRATORY_TRACT | 3 refills | Status: DC

## 2017-01-12 NOTE — Progress Notes (Signed)
Subjective:    Patient ID: Lisa Vega, female    DOB: 1932-01-11, 83 y.o.   MRN: [id number]  Synopsis: Former patient of Dr. Gwenette Greet with COPD in addition history of lung cancer status post lobectomy in 2005.  Also has rheumatoid arthritis and has been on methotrexate since 1986.   HPI Chief Complaint  Patient presents with  . Follow-up    Pt states her breathing is unchanged. Pt stated she intermittent SOB and chest tightness which improves with inhaler use    Lisa Vega says that she is doing well.  She says that she wonders if the cold weather has contributed to her dyspnea to a moderate degree at least.  But in general when she is not in the cold she is feels about the same.  She has intermittent chest tightness.  She uses symbicort regularly twice a day, but only uses the Spiriva about 5-6 times per month.  She uses a rescue inhaler about 1 time per week.  She uses the Spiriva only if she notes more chest tightness.  She uses the albuterol for dyspnea.    She notes some dyspnea when lying flat, feels better when sitting up.    Past Medical History:  Diagnosis Date  . Cancer ([place])    lung ca  . COPD (chronic obstructive pulmonary disease) ([place])   . Dyspnea   . GERD (gastroesophageal reflux disease)   . Hypercholesteremia   . Memory difficulties 10/28/2015  . Rheumatoid arthritis(714.0)   . Thyroid disease   . Tremor 10/28/2015   Jaw tremor  . Uterine prolapse   . Vitamin D deficiency       Review of Systems  Constitutional: Negative for chills, fatigue and fever.  HENT: Negative for postnasal drip, rhinorrhea and sinus pressure.   Respiratory: Positive for shortness of breath. Negative for cough and wheezing.   Cardiovascular: Negative for chest pain, palpitations and leg swelling.       Objective:   Physical Exam Vitals:   01/12/17 1028  BP: 108/68  Pulse: 76  SpO2: 98%  Weight: 156 lb 6.4 oz (70.9 kg)  Height: 5' 1.4" (1.56 m)  O2 saturation rechecked,  actually 98% on RA, O2 saturation initially documented incorrectly  RA  Gen: well appearing HENT: OP clear, TM's clear, neck supple PULM: CTA B, normal percussion CV: RRR, no mgr, trace edema GI: BS+, soft, nontender Derm: no cyanosis or rash Psyche: normal mood and affect   Records from her visit with my partner reviewed where she was treated for a COPD exacerbation.  PFT's 2013:  FEV1 1.49 (81%), ratio 60, TLC 86%, DLCO 65% (stable to better from 2007)  CXR 12/2011:  No acute process.  2013 echocardiogram LVEF 50-55%, normal valvular dysfunction, abnormal septal motion consistent with bundle branch block       Assessment & Plan:  CARCINOMA, LUNG, NONSMALL CELL No symptoms to suggest recurrence.  COPD (chronic obstructive pulmonary disease) ([facility]) Stable interval though she notes some orthopnea, see discussion below. No exacerbations since January 2017. She is only taking Spiriva on an as-needed basis. I talked to her about the importance of using it regularly and how she would get the most benefit from the medicine if she did it that way.  Plan: Flu shot today Continue Symbicort twice a day Advised to use Spiriva daily Follow-up in one year or sooner if needed  Orthopnea She has noted new onset orthopnea over the last several months. On physical exam today she  does not appear to be volume overloaded. I explained to her that the differential diagnosis of orthopnea includes COPD and heart failure among other conditions.  I think that COPD is the more likely culprit as she notes that she doesn't have it when she takes Spiriva.  Plan: Encouraged use Spiriva regularly If she continues to have the problem despite regular Spriva use then we will arrange for an echocardiogram    Current Outpatient Prescriptions:  .  acetaminophen (TYLENOL) 325 MG tablet, Take 650 mg by mouth every 6 (six) hours as needed., Disp: , Rfl:  .  budesonide-formoterol (SYMBICORT) 160-4.5 MCG/ACT  inhaler, Needs OV for further refills., Disp: 6 g, Rfl: 0 .  diazepam (VALIUM) 5 MG tablet, Take 0.5 tablets (2.5 mg total) by mouth 2 (two) times daily as needed for anxiety., Disp: 30 tablet, Rfl: 3 .  folic acid (FOLVITE) 1 MG tablet, Take 1 mg by mouth daily., Disp: , Rfl:  .  gabapentin (NEURONTIN) 100 MG capsule, Take 100 mg by mouth 3 (three) times daily. , Disp: , Rfl:  .  glucose blood (FREESTYLE LITE) test strip, Use as instructed once daily to check sugars, prediabetes, Disp: 100 each, Rfl: 3 .  levothyroxine (SYNTHROID, LEVOTHROID) 88 MCG tablet, Take 1 tablet (88 mcg total) by mouth daily., Disp: 90 tablet, Rfl: 1 .  loratadine (CLARITIN) 10 MG tablet, Take 10 mg by mouth daily., Disp: , Rfl:  .  methotrexate 25 MG/ML SOLN, Inject 8.08 mg into the skin once a week. , Disp: , Rfl:  .  NONFORMULARY OR COMPOUNDED ITEM, Estradiol .02% 1 ML Prefilled Applicator Sig: apply vaginally twice a week #90 Day Supply with 4 refills, Disp: 1 each, Rfl: 3 .  nystatin-triamcinolone (MYCOLOG II) cream, Apply 1 application topically 3 (three) times daily., Disp: 30 g, Rfl: 2 .  pantoprazole (PROTONIX) 40 MG tablet, Take 1 tablet (40 mg total) by mouth daily., Disp: 90 tablet, Rfl: 3 .  PROAIR HFA 108 (90 Base) MCG/ACT inhaler, USE 1-2 PUFFS EVERY 4-6 HOURS AS NEEDED, Disp: 8.5 g, Rfl: 1 .  ranitidine (ZANTAC) 150 MG tablet, Take 1 tablet (150 mg total) by mouth at bedtime as needed for heartburn., Disp: 90 tablet, Rfl: 1 .  sertraline (ZOLOFT) 50 MG tablet, Take 1 tablet (50 mg total) by mouth daily., Disp: 90 tablet, Rfl: 1 .  tiotropium (SPIRIVA) 18 MCG inhalation capsule, Place 1 capsule (18 mcg total) into inhaler and inhale daily., Disp: 90 capsule, Rfl: 2 .  Vitamin D, Ergocalciferol, (DRISDOL) 50000 units CAPS capsule, Take one weekly for 12 weeks then begin one every other week, Disp: 30 capsule, Rfl: 5 .  cephALEXin (KEFLEX) 500 MG capsule, Take 1 capsule (500 mg total) by mouth 2 (two) times  daily. (Patient not taking: Reported on 01/12/2017), Disp: 20 capsule, Rfl: 1

## 2017-01-12 NOTE — Assessment & Plan Note (Signed)
She has noted new onset orthopnea over the last several months. On physical exam today she does not appear to be volume overloaded. I explained to her that the differential diagnosis of orthopnea includes COPD and heart failure among other conditions.  I think that COPD is the more likely culprit as she notes that she doesn't have it when she takes Spiriva.  Plan: Encouraged use Spiriva regularly If she continues to have the problem despite regular Spriva use then we will arrange for an echocardiogram

## 2017-01-12 NOTE — Assessment & Plan Note (Signed)
Stable interval though she notes some orthopnea, see discussion below. No exacerbations since January 2017. She is only taking Spiriva on an as-needed basis. I talked to her about the importance of using it regularly and how she would get the most benefit from the medicine if she did it that way.  Plan: Flu shot today Continue Symbicort twice a day Advised to use Spiriva daily Follow-up in one year or sooner if needed

## 2017-01-12 NOTE — Telephone Encounter (Signed)
Pt was seen in clinic today and new rx was sent in for 90 day supply with 3 refills.  Let pharmacist aware.  Nothing further needed.

## 2017-01-12 NOTE — Patient Instructions (Signed)
Keep taking Symbicort as you are doing Take the Spiriva every day for 2 weeks, if the sensation of shortness of breath in the middle of night goes away then resume taking Spiriva as you're doing previously. However, if you still have that sensation despite taking Spiriva daily please let me know and we will arrange for an echocardiogram.  We will see you back in one year or sooner if needed

## 2017-01-12 NOTE — Assessment & Plan Note (Signed)
No symptoms to suggest recurrence.

## 2017-01-12 NOTE — Addendum Note (Signed)
Addended by: Len Blalock on: 01/12/2017 10:55 AM   Modules accepted: Orders

## 2017-01-14 ENCOUNTER — Ambulatory Visit: Payer: Medicare Other | Admitting: Gynecology

## 2017-01-18 ENCOUNTER — Ambulatory Visit (INDEPENDENT_AMBULATORY_CARE_PROVIDER_SITE_OTHER): Payer: Medicare Other | Admitting: Gynecology

## 2017-01-18 VITALS — BP 128/86

## 2017-01-18 DIAGNOSIS — Z4689 Encounter for fitting and adjustment of other specified devices: Secondary | ICD-10-CM

## 2017-01-18 DIAGNOSIS — E559 Vitamin D deficiency, unspecified: Secondary | ICD-10-CM

## 2017-01-18 DIAGNOSIS — R350 Frequency of micturition: Secondary | ICD-10-CM

## 2017-01-18 LAB — URINALYSIS W MICROSCOPIC + REFLEX CULTURE
Bilirubin Urine: NEGATIVE
Casts: NONE SEEN [LPF]
Crystals: NONE SEEN [HPF]
Glucose, UA: NEGATIVE
Hgb urine dipstick: NEGATIVE
Ketones, ur: NEGATIVE
Nitrite: NEGATIVE
Protein, ur: NEGATIVE
Specific Gravity, Urine: 1.011 (ref 1.001–1.035)
Yeast: NONE SEEN [HPF]
pH: 6 (ref 5.0–8.0)

## 2017-01-18 NOTE — Addendum Note (Signed)
Addended by: Burnett Kanaris on: 01/18/2017 02:14 PM   Modules accepted: Orders

## 2017-01-18 NOTE — Progress Notes (Signed)
   Patient presented to the office for pessary maintenance.Patient with known history of uterine prolapse along with cystocele and rectocele and history of urinary incontinence.Patient with multiple comorbidities not surgical candidate see past medical history list. The patient has had history of lung cancer and lobectomy. She also has history of COPD. The patient is applying vaginal estrogen twice a week and has been doing well.She has done well with a ring pessary. Patient recently was treated for vitamin D deficiency and is here to check her vitamin D level.   Pelvic exam: Patient with small paper cut like area above the clitoris due to atrophy Bartholin urethra Skene glands with atrophic changes Vagina: No gross lesions or discharge, atrophic changes, second-degree cystocele and second-degree rectocele and first degree uterine descensus  Cervix: No gross lesions or discharge  Uterus axial, normal size, shape and consistency, non-tender and mobile  Adnexa Without masses or tenderness  The pessary was cleaned and replaced. Vitamin D level to be drawn today.

## 2017-01-19 LAB — VITAMIN D 25 HYDROXY (VIT D DEFICIENCY, FRACTURES): Vit D, 25-Hydroxy: 37 ng/mL (ref 30–100)

## 2017-01-19 LAB — URINE CULTURE

## 2017-01-22 ENCOUNTER — Ambulatory Visit (INDEPENDENT_AMBULATORY_CARE_PROVIDER_SITE_OTHER): Payer: Medicare Other | Admitting: Podiatry

## 2017-01-22 ENCOUNTER — Encounter: Payer: Self-pay | Admitting: Podiatry

## 2017-01-22 DIAGNOSIS — M2010 Hallux valgus (acquired), unspecified foot: Secondary | ICD-10-CM | POA: Diagnosis not present

## 2017-01-22 DIAGNOSIS — M779 Enthesopathy, unspecified: Secondary | ICD-10-CM

## 2017-01-22 DIAGNOSIS — L84 Corns and callosities: Secondary | ICD-10-CM

## 2017-01-22 MED ORDER — TRIAMCINOLONE ACETONIDE 10 MG/ML IJ SUSP
10.0000 mg | Freq: Once | INTRAMUSCULAR | Status: AC
  Administered 2017-01-22: 10 mg

## 2017-01-22 NOTE — Patient Instructions (Signed)

## 2017-01-25 NOTE — Progress Notes (Signed)
Subjective:     Patient ID: Lisa Vega, female   DOB: Jan 05, 1932, 83 y.o.   MRN: [id number]  HPI patient and she's doing well with occasional discomfort and did not bring right shoes and orthotics   Review of Systems     Objective:   Physical Exam Neurovascular status intact with patient having prominent metatarsals with diminished fat pad noted    Assessment:     Abnormal position of structural    Plan:     Orthotics dispensed with all instructions on usage and discussed continued conservative care conditions

## 2017-01-29 ENCOUNTER — Telehealth: Payer: Self-pay | Admitting: *Deleted

## 2017-01-29 ENCOUNTER — Encounter: Payer: Self-pay | Admitting: Women's Health

## 2017-01-29 ENCOUNTER — Ambulatory Visit (INDEPENDENT_AMBULATORY_CARE_PROVIDER_SITE_OTHER): Payer: Medicare Other | Admitting: Women's Health

## 2017-01-29 VITALS — BP 134/82

## 2017-01-29 DIAGNOSIS — N898 Other specified noninflammatory disorders of vagina: Secondary | ICD-10-CM | POA: Diagnosis not present

## 2017-01-29 DIAGNOSIS — B373 Candidiasis of vulva and vagina: Secondary | ICD-10-CM | POA: Diagnosis not present

## 2017-01-29 DIAGNOSIS — B3731 Acute candidiasis of vulva and vagina: Secondary | ICD-10-CM

## 2017-01-29 DIAGNOSIS — R300 Dysuria: Secondary | ICD-10-CM

## 2017-01-29 LAB — URINALYSIS W MICROSCOPIC + REFLEX CULTURE
Bilirubin Urine: NEGATIVE
Casts: NONE SEEN [LPF]
Crystals: NONE SEEN [HPF]
Glucose, UA: NEGATIVE
Ketones, ur: NEGATIVE
Nitrite: NEGATIVE
Protein, ur: NEGATIVE
RBC / HPF: NONE SEEN RBC/HPF (ref <=2)
Specific Gravity, Urine: <1.005 (ref 1.001–1.035)
Yeast: NONE SEEN [HPF]
pH: 5 (ref 5.0–8.0)

## 2017-01-29 LAB — WET PREP FOR TRICH, YEAST, CLUE
Clue Cells Wet Prep HPF POC: NONE SEEN
Trich, Wet Prep: NONE SEEN

## 2017-01-29 MED ORDER — SULFAMETHOXAZOLE-TRIMETHOPRIM 800-160 MG PO TABS: 1.0000 | ORAL_TABLET | Freq: Two times a day (BID) | ORAL | 0 refills | Status: DC

## 2017-01-29 MED ORDER — FLUCONAZOLE 150 MG PO TABS: 150.0000 mg | ORAL_TABLET | Freq: Once | ORAL | 1 refills | Status: DC

## 2017-01-29 MED ORDER — FLUCONAZOLE 150 MG PO TABS: 150.0000 mg | ORAL_TABLET | Freq: Once | ORAL | 1 refills | Status: AC

## 2017-01-29 MED ORDER — CIPROFLOXACIN HCL 250 MG PO TABS: 250.0000 mg | ORAL_TABLET | Freq: Two times a day (BID) | ORAL | 0 refills | Status: DC

## 2017-01-29 NOTE — Patient Instructions (Signed)
Vaginal Yeast infection, Adult Vaginal yeast infection is a condition that causes soreness, swelling, and redness (inflammation) of the vagina. It also causes vaginal discharge. This is a common condition. Some women get this infection frequently. What are the causes? This condition is caused by a change in the normal balance of the yeast (candida) and bacteria that live in the vagina. This change causes an overgrowth of yeast, which causes the inflammation. What increases the risk? This condition is more likely to develop in:  Women who take antibiotic medicines.  Women who have diabetes.  Women who take birth control pills.  Women who are pregnant.  Women who douche often.  Women who have a weak defense (immune) system.  Women who have been taking steroid medicines for a long time.  Women who frequently wear tight clothing. What are the signs or symptoms? Symptoms of this condition include:  White, thick vaginal discharge.  Swelling, itching, redness, and irritation of the vagina. The lips of the vagina (vulva) may be affected as well.  Pain or a burning feeling while urinating.  Pain during sex. How is this diagnosed? This condition is diagnosed with a medical history and physical exam. This will include a pelvic exam. Your health care provider will examine a sample of your vaginal discharge under a microscope. Your health care provider may send this sample for testing to confirm the diagnosis. How is this treated? This condition is treated with medicine. Medicines may be over-the-counter or prescription. You may be told to use one or more of the following:  Medicine that is taken orally.  Medicine that is applied as a cream.  Medicine that is inserted directly into the vagina (suppository). Follow these instructions at home:  Take or apply over-the-counter and prescription medicines only as told by your health care provider.  Do not have sex until your health care  provider has approved. Tell your sex partner that you have a yeast infection. That person should go to his or her health care provider if he or she develops symptoms.  Do not wear tight clothes, such as pantyhose or tight pants.  Avoid using tampons until your health care provider approves.  Eat more yogurt. This may help to keep your yeast infection from returning.  Try taking a sitz bath to help with discomfort. This is a warm water bath that is taken while you are sitting down. The water should only come up to your hips and should cover your buttocks. Do this 3-4 times per day or as told by your health care provider.  Do not douche.  Wear breathable, cotton underwear.  If you have diabetes, keep your blood sugar levels under control. Contact a health care provider if:  You have a fever.  Your symptoms go away and then return.  Your symptoms do not get better with treatment.  Your symptoms get worse.  You have new symptoms.  You develop blisters in or around your vagina.  You have blood coming from your vagina and it is not your menstrual period.  You develop pain in your abdomen. This information is not intended to replace advice given to you by your health care provider. Make sure you discuss any questions you have with your health care provider. Document Released: 09/23/2005 Document Revised: 05/27/2016 Document Reviewed: 06/17/2015 Elsevier Interactive Patient Education  2017 Elsevier Inc. Urinary Tract Infection, Adult Introduction A urinary tract infection (UTI) is an infection of any part of the urinary tract. The urinary tract includes  the:  Kidneys.  Ureters.  Bladder.  Urethra. These organs make, store, and get rid of pee (urine) in the body. Follow these instructions at home:  Take over-the-counter and prescription medicines only as told by your doctor.  If you were prescribed an antibiotic medicine, take it as told by your doctor. Do not stop taking  the antibiotic even if you start to feel better.  Avoid the following drinks:  Alcohol.  Caffeine.  Tea.  Carbonated drinks.  Drink enough fluid to keep your pee clear or pale yellow.  Keep all follow-up visits as told by your doctor. This is important.  Make sure to:  Empty your bladder often and completely. Do not to hold pee for long periods of time.  Empty your bladder before and after sex.  Wipe from front to back after a bowel movement if you are female. Use each tissue one time when you wipe. Contact a doctor if:  You have back pain.  You have a fever.  You feel sick to your stomach (nauseous).  You throw up (vomit).  Your symptoms do not get better after 3 days.  Your symptoms go away and then come back. Get help right away if:  You have very bad back pain.  You have very bad lower belly (abdominal) pain.  You are throwing up and cannot keep down any medicines or water. This information is not intended to replace advice given to you by your health care provider. Make sure you discuss any questions you have with your health care provider. Document Released: 06/01/2008 Document Revised: 05/21/2016 Document Reviewed: 11/04/2015  2017 Elsevier

## 2017-01-29 NOTE — Progress Notes (Signed)
Presents with several complaints: low back pain, burning with urination, suprapubic pain, and vaginal  Itching. Symptoms started last week. Pain is intermittent but currently a 4/10, this a.m. 8/10. She denies fever, chills, nausea, vomiting, dysuria, change in bowel pattern, weight loss. Has pessary in place,minimal leaking. History of RA on methotrexate, lung cancer s/p lobectomy.  Has not tried anything to relive her symptoms. Widowed,currently living with daughter and feeling somewhat displaced. Postmenopausal on no HRT with no bleeding.  Allergies  Allergen Reactions  . Aspirin   . Codeine     REACTION: hallucinations  . Demerol   . Meperidine Hcl     REACTION: severe GI upset   ROS: negative, pertinent positives in the HPI.  Physical exam: BP 134/82   Well appearing, dress appropriate for weather, alert and oriented, in no acute distress. No CVAT Wet prep: few yeast,  few WBC, moderate bacteria, . UA: Trace leukocytes, trace blood, 6-10 WBCs, 6-10 squamous epithelials few bacteria  Yeast vaginitis Probable UTI  Plan:  1. Dysuria  Urine culture pending - ciprofloxacin (CIPRO) 250 MG tablet; Take 1 tablet (250 mg total) by mouth 2 (two) times daily.  Dispense: 6 tablet; Refill: 0  2. Yeast vaginitis  - fluconazole (DIFLUCAN) 150 MG tablet; Take 1 tablet (150 mg total) by mouth once.  Dispense: 1 tablet; Refill: 1  Complete medication prescribed, call if symptoms worsens or does not get better after 2 days, proceed with GYN ultrasound.

## 2017-01-29 NOTE — Telephone Encounter (Signed)
Pt called c/o worsen pelvic discomfort, lower back discomfort, burning with urination since pessary placement on 01/18/17. Pt is not having any fever, chills, vomiting. States her lower abdomen appears swollen. I advised pt best to have OV for exam, transferred to front desk to schedule.

## 2017-01-31 LAB — URINE CULTURE: Organism ID, Bacteria: NO GROWTH

## 2017-02-01 ENCOUNTER — Telehealth: Payer: Self-pay

## 2017-02-01 ENCOUNTER — Telehealth: Payer: Self-pay | Admitting: Internal Medicine

## 2017-02-01 ENCOUNTER — Other Ambulatory Visit: Payer: Self-pay

## 2017-02-01 NOTE — Telephone Encounter (Signed)
Pt called in and needs refill of the pantoprazole (PROTONIX) 40 MG tablet [[id number]]  Sent to her mail order pharmacy

## 2017-02-01 NOTE — Telephone Encounter (Signed)
Patient said she was miserable all weekend. She said she was unable to have BM because of all the lower pain when she tried to push to have BM. She said last night she hardly slept and terrible cramping.  She finally had a good BM and she said she feels so much better. She said she still feels sore in her lower pelvis but hopes this is over.  I recommended that she might want to follow up with her GI Dr. Watt Climes. She said she is going to either go see him or her PCP to figure this out.  I told her I would let [place] know about all this. She said only need to call her back if [place] has other suggestions than what she and I discussed (PCP/GI).

## 2017-02-01 NOTE — Telephone Encounter (Signed)
-----   Message from Huel Cote, NP sent at 01/31/2017 12:21 PM EST ----- Please call and review urine cult was neg. She was treated for UTI at [facility], let me know if still not feeling better.

## 2017-02-02 DIAGNOSIS — M79643 Pain in unspecified hand: Secondary | ICD-10-CM | POA: Diagnosis not present

## 2017-02-02 DIAGNOSIS — M069 Rheumatoid arthritis, unspecified: Secondary | ICD-10-CM | POA: Diagnosis not present

## 2017-02-02 DIAGNOSIS — J449 Chronic obstructive pulmonary disease, unspecified: Secondary | ICD-10-CM | POA: Diagnosis not present

## 2017-02-02 DIAGNOSIS — M0579 Rheumatoid arthritis with rheumatoid factor of multiple sites without organ or systems involvement: Secondary | ICD-10-CM | POA: Diagnosis not present

## 2017-02-02 DIAGNOSIS — Z79899 Other long term (current) drug therapy: Secondary | ICD-10-CM | POA: Diagnosis not present

## 2017-02-02 MED ORDER — PANTOPRAZOLE SODIUM 40 MG PO TBEC: 40.0000 mg | DELAYED_RELEASE_TABLET | Freq: Every day | ORAL | 1 refills | Status: DC

## 2017-02-02 NOTE — Telephone Encounter (Signed)
RX has been sent.

## 2017-02-02 NOTE — Telephone Encounter (Signed)
TC States doing better, questions answered

## 2017-02-05 ENCOUNTER — Telehealth: Payer: Self-pay | Admitting: Internal Medicine

## 2017-02-05 MED ORDER — PANTOPRAZOLE SODIUM 40 MG PO TBEC: 40.0000 mg | DELAYED_RELEASE_TABLET | Freq: Every day | ORAL | 1 refills | Status: DC

## 2017-02-05 NOTE — Telephone Encounter (Signed)
Pt request to speak to the assistant concern about rx for Protonix. Pt stated [facility] waiting for authorization from our office to refill this med, advise pt that we sent this in on 02/02/17. Please check and call pt back

## 2017-02-05 NOTE — Telephone Encounter (Signed)
Spoke with pt, RX has been resent to [facility] with pts okay.

## 2017-02-08 ENCOUNTER — Telehealth: Payer: Self-pay | Admitting: Internal Medicine

## 2017-02-08 MED ORDER — PANTOPRAZOLE SODIUM 40 MG PO TBEC: 40.0000 mg | DELAYED_RELEASE_TABLET | Freq: Every day | ORAL | 1 refills | Status: DC

## 2017-02-08 NOTE — Telephone Encounter (Signed)
Patient states that she called [facility] and they are stating they do not have protonix.  Can you please resend.

## 2017-02-17 ENCOUNTER — Telehealth: Payer: Self-pay | Admitting: Emergency Medicine

## 2017-02-17 MED ORDER — PANTOPRAZOLE SODIUM 40 MG PO TBEC: 40.0000 mg | DELAYED_RELEASE_TABLET | Freq: Every day | ORAL | 1 refills | Status: DC

## 2017-02-17 NOTE — Telephone Encounter (Signed)
PA for Protonix has been approved. 12/17/2016-02/15/2018. RX has been re-[facility]

## 2017-02-19 ENCOUNTER — Telehealth: Payer: Self-pay | Admitting: *Deleted

## 2017-02-19 NOTE — Telephone Encounter (Signed)
Called patient in regards to custom molded orthotics that she mailed back to the office with the bill stating "Returning for credit can't use!"  Patient stated that we insisted she get the orthotics even though she had another pair at home.  I explained that I could not return them or credit her account as they are a custom product.  She stated that she does not owe anything for them beacause she is returning them and has excellent IT consultant.  I stated again I could not remove the charge but we.... She cut me off stating they didn't fit when she came in to pick them up and they didn't fit anything she had at home.  She has cancelled her appointments with [place] as she has had nothing but problems with [place] do what ever we want with the charge she will not pay and she will never come back and hung up on me.  Patient would not allow me to speak and offer to adjust the orthotics however necessary to make them fit appropriately.

## 2017-02-22 DIAGNOSIS — H1851 Endothelial corneal dystrophy: Secondary | ICD-10-CM | POA: Diagnosis not present

## 2017-02-22 DIAGNOSIS — H26493 Other secondary cataract, bilateral: Secondary | ICD-10-CM | POA: Diagnosis not present

## 2017-02-22 DIAGNOSIS — Z961 Presence of intraocular lens: Secondary | ICD-10-CM | POA: Diagnosis not present

## 2017-02-22 NOTE — Telephone Encounter (Signed)
Go ahead and take them off her bill if federal won't cover them

## 2017-02-24 ENCOUNTER — Ambulatory Visit: Payer: Medicare Other | Admitting: Podiatry

## 2017-02-26 NOTE — Telephone Encounter (Signed)
Per Jocelyn Lamer it has been done

## 2017-02-26 NOTE — Telephone Encounter (Signed)
Please credit her

## 2017-03-05 ENCOUNTER — Other Ambulatory Visit: Payer: Self-pay | Admitting: Internal Medicine

## 2017-03-17 ENCOUNTER — Telehealth: Payer: Self-pay | Admitting: Internal Medicine

## 2017-03-17 NOTE — Telephone Encounter (Signed)
Called patient to see if she would like to r/s her awv that was cx in Nov 2017. Lvm for patient to call office to schedule appt.

## 2017-03-25 ENCOUNTER — Other Ambulatory Visit: Payer: Self-pay | Admitting: Neurology

## 2017-03-25 ENCOUNTER — Telehealth: Payer: Self-pay

## 2017-03-25 NOTE — Telephone Encounter (Signed)
Patient called stating that she does not think "pessary is working for me anymore".  She said she has been away a few weeks and noticed she kept having to push it back up. Feels like it is not holding her bladder correctly.  I recommended office visit today but she said she did not feel well today and would like to schedule another day. I tranferred her to [facility] to schedule appt.

## 2017-03-29 ENCOUNTER — Ambulatory Visit (INDEPENDENT_AMBULATORY_CARE_PROVIDER_SITE_OTHER): Payer: Medicare Other | Admitting: Gynecology

## 2017-03-29 ENCOUNTER — Encounter: Payer: Self-pay | Admitting: Gynecology

## 2017-03-29 VITALS — BP 136/82 | Ht 61.4 in | Wt 158.4 lb

## 2017-03-29 DIAGNOSIS — N898 Other specified noninflammatory disorders of vagina: Secondary | ICD-10-CM

## 2017-03-29 DIAGNOSIS — R350 Frequency of micturition: Secondary | ICD-10-CM | POA: Diagnosis not present

## 2017-03-29 DIAGNOSIS — Z4689 Encounter for fitting and adjustment of other specified devices: Secondary | ICD-10-CM | POA: Diagnosis not present

## 2017-03-29 DIAGNOSIS — N819 Female genital prolapse, unspecified: Secondary | ICD-10-CM | POA: Diagnosis not present

## 2017-03-29 DIAGNOSIS — R300 Dysuria: Secondary | ICD-10-CM

## 2017-03-29 LAB — URINALYSIS W MICROSCOPIC + REFLEX CULTURE
Bilirubin Urine: NEGATIVE
Casts: NONE SEEN [LPF]
Crystals: NONE SEEN [HPF]
Glucose, UA: NEGATIVE
Ketones, ur: NEGATIVE
Nitrite: NEGATIVE
Protein, ur: NEGATIVE
Specific Gravity, Urine: 1.02 (ref 1.001–1.035)
Yeast: NONE SEEN [HPF]
pH: 5.5 (ref 5.0–8.0)

## 2017-03-29 LAB — WET PREP FOR TRICH, YEAST, CLUE
Clue Cells Wet Prep HPF POC: NONE SEEN
Trich, Wet Prep: NONE SEEN
WBC, Wet Prep HPF POC: NONE SEEN
Yeast Wet Prep HPF POC: NONE SEEN

## 2017-03-29 MED ORDER — FLUCONAZOLE 150 MG PO TABS: 150.0000 mg | ORAL_TABLET | Freq: Once | ORAL | 3 refills | Status: AC

## 2017-03-29 MED ORDER — FLUCONAZOLE 150 MG PO TABS: 150.0000 mg | ORAL_TABLET | Freq: Once | ORAL | 0 refills | Status: DC

## 2017-03-29 NOTE — Patient Instructions (Signed)
Fluconazole tablets °What is this medicine? °FLUCONAZOLE (floo KON na zole) is an antifungal medicine. It is used to treat certain kinds of fungal or yeast infections. °This medicine may be used for other purposes; ask your health care provider or pharmacist if you have questions. °COMMON BRAND NAME(S): Diflucan °What should I tell my health care provider before I take this medicine? °They need to know if you have any of these conditions: °-history of irregular heart beat °-kidney disease °-an unusual or allergic reaction to fluconazole, other azole antifungals, medicines, foods, dyes, or preservatives °-pregnant or trying to get pregnant °-breast-feeding °How should I use this medicine? °Take this medicine by mouth. Follow the directions on the prescription label. Do not take your medicine more often than directed. °Talk to your pediatrician regarding the use of this medicine in children. Special care may be needed. This medicine has been used in children as young as 6 months of age. °Overdosage: If you think you have taken too much of this medicine contact a poison control center or emergency room at once. °NOTE: This medicine is only for you. Do not share this medicine with others. °What if I miss a dose? °If you miss a dose, take it as soon as you can. If it is almost time for your next dose, take only that dose. Do not take double or extra doses. °What may interact with this medicine? °Do not take this medicine with any of the following medications: °-astemizole °-certain medicines for irregular heart beat like dofetilide, dronedarone, quinidine °-cisapride °-erythromycin °-lomitapide °-other medicines that prolong the QT interval (cause an abnormal heart rhythm) °-pimozide °-terfenadine °-thioridazine °-tolvaptan °-ziprasidone °This medicine may also interact with the following medications: °-antiviral medicines for HIV or AIDS °-birth control pills °-certain antibiotics like rifabutin, rifampin °-certain  medicines for blood pressure like amlodipine, isradipine, felodipine, hydrochlorothiazide, losartan, nifedipine °-certain medicines for cancer like cyclophosphamide, vinblastine, vincristine °-certain medicines for cholesterol like atorvastatin, lovastatin, fluvastatin, simvastatin °-certain medicines for depression, anxiety, or psychotic disturbances like amitriptyline, midazolam, nortriptyline, triazolam °-certain medicines for diabetes like glipizide, glyburide, tolbutamide °-certain medicines for pain like alfentanil, fentanyl, methadone °-certain medicines for seizures like carbamazepine, phenytoin °-certain medicines that treat or prevent blood clots like warfarin °-halofantrine °-medicines that lower your chance of fighting infection like cyclosporine, prednisone, tacrolimus °-NSAIDS, medicines for pain and inflammation, like celecoxib, diclofenac, flurbiprofen, ibuprofen, meloxicam, naproxen °-other medicines for fungal infections °-sirolimus °-theophylline °-tofacitinib °This list may not describe all possible interactions. Give your health care provider a list of all the medicines, herbs, non-prescription drugs, or dietary supplements you use. Also tell them if you smoke, drink alcohol, or use illegal drugs. Some items may interact with your medicine. °What should I watch for while using this medicine? °Visit your doctor or health care professional for regular checkups. If you are taking this medicine for a long time you may need blood work. Tell your doctor if your symptoms do not improve. Some fungal infections need many weeks or months of treatment to cure. °Alcohol can increase possible damage to your liver. Avoid alcoholic drinks. °If you have a vaginal infection, do not have sex until you have finished your treatment. You can wear a sanitary napkin. Do not use tampons. Wear freshly washed cotton, not synthetic, panties. °What side effects may I notice from receiving this medicine? °Side effects that  you should report to your doctor or health care professional as soon as possible: °-allergic reactions like skin rash or itching, hives, swelling of the   lips, mouth, tongue, or throat °-dark urine °-feeling dizzy or faint °-irregular heartbeat or chest pain °-redness, blistering, peeling or loosening of the skin, including inside the mouth °-trouble breathing °-unusual bruising or bleeding °-vomiting °-yellowing of the eyes or skin °Side effects that usually do not require medical attention (report to your doctor or health care professional if they continue or are bothersome): °-changes in how food tastes °-diarrhea °-headache °-stomach upset or nausea °This list may not describe all possible side effects. Call your doctor for medical advice about side effects. You may report side effects to FDA at 1-800-FDA-1088. °Where should I keep my medicine? °Keep out of the reach of children. °Store at room temperature below 30 degrees C (86 degrees F). Throw away any medicine after the expiration date. °NOTE: This sheet is a summary. It may not cover all possible information. If you have questions about this medicine, talk to your doctor, pharmacist, or health care provider. °© 2018 Elsevier/[employer] Standard (2013-07-22 19:37:38) ° °

## 2017-03-29 NOTE — Progress Notes (Addendum)
   Patient is an 83 year old who presented to the office today complaining for the past week and half of dysuria and frequency in urination. Also vulvar pruritus and slight discharge. Also she feels that her pessary has been tight and contributing to her perhaps not emptying her bladder completely. Her podiatrist recently put her on Keflex for a foot infection and she's on her fourth day. She has had history in the past of yeast infection. 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  Patient with past history of uterine prolapse along with cystocele and rectocele and urinary incontinence and her routine pessary has helped although definitive feels somewhat tight and discomfort during and she thought that it was sitting lower than normal.  Exam: Bartholin, Urethra, Skene Glands: Within normal limits Vagina: Atrophic changes, second-degree cystocele, second-degree rectocele, first-degree uterine descensus Cervix: No gross lesions or discharge Uterus normal size shape and consistency and nontender, and mobile Adnexa Without masses or tenderness  Wet prep otherwise normal few bacteria  Urinalysis trace blood trace leukocyte few bacteria culture pending  Patient was fitted for a slightly smaller pessary ring type (4 inch incontinence ring dish with support)  Assessment/plan: Patient with possibly underlying urinary tract infection? She should continue the antibiotic her podiatrist had prescribed (Keflex) that she started a few days ago as we wait for the result of the urine  Culture that was obtained today. She will return to the office at the end of the week to pick up the different pessary which she tried in the office today and felt much better and had good support. Her wet prep was essentially  negative. She wanted a prescription for Diflucan in the event of a yeast infection when she finishes her antibiotics so this was provided 150 mg one by mouth with 3 refills.

## 2017-03-30 LAB — URINE CULTURE: Organism ID, Bacteria: NO GROWTH

## 2017-04-06 ENCOUNTER — Ambulatory Visit (INDEPENDENT_AMBULATORY_CARE_PROVIDER_SITE_OTHER): Payer: Medicare Other | Admitting: Gynecology

## 2017-04-06 ENCOUNTER — Encounter: Payer: Self-pay | Admitting: Gynecology

## 2017-04-06 VITALS — BP 117/84 | Ht 61.4 in | Wt 157.0 lb

## 2017-04-06 DIAGNOSIS — Z4689 Encounter for fitting and adjustment of other specified devices: Secondary | ICD-10-CM | POA: Diagnosis not present

## 2017-04-06 DIAGNOSIS — N814 Uterovaginal prolapse, unspecified: Secondary | ICD-10-CM | POA: Diagnosis not present

## 2017-04-06 NOTE — Progress Notes (Addendum)
   Patient is an 83 year old that presented to the office today as a result of last office visit on April 2 that she had been complaining of her pessary seeming to the following now so she was fitted for a different type of pessary which was a 4 inch incontinence ring-with support.Patient with past history of uterine prolapse along with cystocele and rectocele and urinary incontinence and not a surgical candidate and due to the fact that she has debilitating rheumatoid arthritis comes to the office every 3 months for pessary maintenance.  Exam: Pelvic: Bartholin urethra Skene was within normal limits Vagina: Atrophic changes, second-degree cystocele, second-degree rectocele, first-degree uterine descensus Cervix: No gross lesions or discharge  The new pessary was placed patient will continue to apply vaginal estrogen twice a and return every 3 months for pessary maintenance.

## 2017-04-15 ENCOUNTER — Ambulatory Visit (INDEPENDENT_AMBULATORY_CARE_PROVIDER_SITE_OTHER): Payer: Medicare Other | Admitting: Gynecology

## 2017-04-15 ENCOUNTER — Encounter: Payer: Self-pay | Admitting: Gynecology

## 2017-04-15 VITALS — Ht 61.4 in | Wt 157.4 lb

## 2017-04-15 DIAGNOSIS — T8384XA Pain from genitourinary prosthetic devices, implants and grafts, initial encounter: Secondary | ICD-10-CM | POA: Diagnosis not present

## 2017-04-15 DIAGNOSIS — N898 Other specified noninflammatory disorders of vagina: Secondary | ICD-10-CM

## 2017-04-15 DIAGNOSIS — B3731 Acute candidiasis of vulva and vagina: Secondary | ICD-10-CM

## 2017-04-15 DIAGNOSIS — D225 Melanocytic nevi of trunk: Secondary | ICD-10-CM | POA: Diagnosis not present

## 2017-04-15 DIAGNOSIS — B9689 Other specified bacterial agents as the cause of diseases classified elsewhere: Secondary | ICD-10-CM | POA: Diagnosis not present

## 2017-04-15 DIAGNOSIS — B373 Candidiasis of vulva and vagina: Secondary | ICD-10-CM | POA: Diagnosis not present

## 2017-04-15 DIAGNOSIS — T839XXS Unspecified complication of genitourinary prosthetic device, implant and graft, sequela: Secondary | ICD-10-CM

## 2017-04-15 DIAGNOSIS — L0202 Furuncle of face: Secondary | ICD-10-CM | POA: Diagnosis not present

## 2017-04-15 DIAGNOSIS — B0089 Other herpesviral infection: Secondary | ICD-10-CM | POA: Diagnosis not present

## 2017-04-15 LAB — WET PREP FOR TRICH, YEAST, CLUE
Clue Cells Wet Prep HPF POC: NONE SEEN
Trich, Wet Prep: NONE SEEN

## 2017-04-15 MED ORDER — FLUCONAZOLE 150 MG PO TABS: 150.0000 mg | ORAL_TABLET | Freq: Once | ORAL | 0 refills | Status: AC

## 2017-04-15 NOTE — Patient Instructions (Signed)

## 2017-04-15 NOTE — Progress Notes (Signed)
   Patient presented to the office today with discomfort from her pessary and a slight vaginal discharge. She has stated that on April 2 that she had been complaining of her pessary given her the sensation that it was about to fall out so she was fitted for a different type of pessary which was a 4 inch incontinence ring-with support.Patient with past history of uterine prolapse along with cystocele and rectocele and urinary incontinence and not a surgical candidate and due to the fact that she has debilitating rheumatoid arthritis comes to the office every 3 months for pessary maintenance.  Her pessary was removed and the exam demonstrated the following: Pelvic: Bartholin urethra Skene was within normal limits Vagina: Atrophic changes, second-degree cystocele, second-degree rectocele, first-degree uterine descensus slight white discharge was noted and wet. Her confirmed few yeast  Assessment/plan: Yeast vaginitis. Patient brought her old pessary which was less rigid which she will begin to use again and return every 3 months for maintenance. She'll continue to use vaginal estrogen cream twice a week. For her yeast vaginitis she'll be prescribed Diflucan 150 mg 1 by mouth today.

## 2017-04-16 ENCOUNTER — Telehealth: Payer: Self-pay | Admitting: *Deleted

## 2017-04-16 MED ORDER — FLUCONAZOLE 150 MG PO TABS: 150.0000 mg | ORAL_TABLET | Freq: Once | ORAL | 0 refills | Status: AC

## 2017-04-16 NOTE — Telephone Encounter (Signed)
Pt was prescribed Diflucan 150 mg tablet #1 on OV 04/15/17, Rx was sent wrong pharmacy, should have been sent to local, not mail order. Rx sent to local pharmacy.

## 2017-04-20 ENCOUNTER — Ambulatory Visit: Payer: Medicare Other | Admitting: Gynecology

## 2017-05-03 ENCOUNTER — Telehealth: Payer: Self-pay

## 2017-05-03 ENCOUNTER — Ambulatory Visit (INDEPENDENT_AMBULATORY_CARE_PROVIDER_SITE_OTHER)
Admission: RE | Admit: 2017-05-03 | Discharge: 2017-05-03 | Disposition: A | Discharge status: Home / Self Care | Payer: Medicare Other | Source: Ambulatory Visit | Attending: Adult Health | Admitting: Adult Health

## 2017-05-03 ENCOUNTER — Ambulatory Visit (INDEPENDENT_AMBULATORY_CARE_PROVIDER_SITE_OTHER): Payer: Medicare Other | Admitting: Adult Health

## 2017-05-03 ENCOUNTER — Encounter: Payer: Self-pay | Admitting: Adult Health

## 2017-05-03 VITALS — BP 116/64 | HR 73 | Ht 64.0 in | Wt 153.4 lb

## 2017-05-03 DIAGNOSIS — J438 Other emphysema: Secondary | ICD-10-CM | POA: Diagnosis not present

## 2017-05-03 DIAGNOSIS — J181 Lobar pneumonia, unspecified organism: Secondary | ICD-10-CM

## 2017-05-03 DIAGNOSIS — R0602 Shortness of breath: Secondary | ICD-10-CM | POA: Diagnosis not present

## 2017-05-03 DIAGNOSIS — R05 Cough: Secondary | ICD-10-CM | POA: Diagnosis not present

## 2017-05-03 DIAGNOSIS — J189 Pneumonia, unspecified organism: Secondary | ICD-10-CM

## 2017-05-03 MED ORDER — LEVOFLOXACIN 500 MG PO TABS: 500.0000 mg | ORAL_TABLET | Freq: Every day | ORAL | 0 refills | Status: DC

## 2017-05-03 NOTE — Progress Notes (Signed)
$'@Patient'n$  ID: Lisa Vega, female    DOB: [birth year], 83 y.o.   MRN: [id number]  Chief Complaint  Patient presents with  . Acute Visit    COPD     Referring provider: Binnie Rail, MD  HPI: 83 year old female followed for known history of COPD History of lung cancer status lobectomy in 2005 She has rheumatoid arthritis on methotrexate since 1986   TEST PFT's 2013:  FEV1 1.49 (81%), ratio 60, TLC 86%, DLCO 65% (stable to better from 2007)  CXR 12/2015>stable chest   2013 echocardiogram LVEF 50-55%, normal valvular dysfunction, abnormal septal motion consistent with bundle branch block  05/03/2017 Acute OV  : COPD  Pt presents for an acute office visit.  Complains of 2 weeks of sore throat , cough, congestion , headache , fatigue, lightheaded. Pain with coughing and deep breaths. Coughing up thick green mucus. Appetite is down . No nv/v. No hemoptyiss . No chest pain .  Took some left over keflex at home for last 10 d. Did not help that much . Has taken some otc cold meds with little help.  CXR today shows RLL infiltrate .  She remains on Symbicort twice daily. She is on Spiriva daily .    Allergies  Allergen Reactions  . Aspirin   . Codeine     REACTION: hallucinations  . Demerol   . Meperidine Hcl     REACTION: severe GI upset    Immunization History  Administered Date(s) Administered  . Influenza Split 09/28/2011, 09/28/2015  . Influenza Whole 09/27/2012  . Influenza, High Dose Seasonal PF 10/28/2016, 01/12/2017  . Influenza,inj,Quad PF,36+ Mos 09/20/2013, 09/12/2014  . Pneumococcal Conjugate-13 10/24/2015  . Pneumococcal Polysaccharide-23 12/29/2007    Past Medical History:  Diagnosis Date  . Cancer ([place])    lung ca  . COPD (chronic obstructive pulmonary disease) ([place])   . Dyspnea   . GERD (gastroesophageal reflux disease)   . Hypercholesteremia   . Memory difficulties 10/28/2015  . Rheumatoid arthritis(714.0)   . Thyroid disease   . Tremor  10/28/2015   Jaw tremor  . Uterine prolapse   . Vitamin D deficiency     Tobacco History: History  Smoking Status  . Former Smoker  . Packs/day: 1.00  . Years: 20.00  . Types: Cigarettes  . Quit date: 11/10/2004  Smokeless Tobacco  . Never Used   Counseling given: Not Answered   Outpatient Encounter Prescriptions as of 05/03/2017  Medication Sig  . acetaminophen (TYLENOL) 325 MG tablet Take 650 mg by mouth every 6 (six) hours as needed.  . budesonide-formoterol (SYMBICORT) 160-4.5 MCG/ACT inhaler Needs OV for further refills.  . Cholecalciferol (VITAMIN D) 2000 units CAPS Take 2,000 Units by mouth daily.  . diazepam (VALIUM) 5 MG tablet Take 0.5 tablets (2.5 mg total) by mouth 2 (two) times daily as needed for anxiety.  . folic acid (FOLVITE) 1 MG tablet Take 1 mg by mouth daily.  Marland Kitchen gabapentin (NEURONTIN) 100 MG capsule Take 100 mg by mouth 3 (three) times daily.   Marland Kitchen glucose blood (FREESTYLE LITE) test strip Use as instructed once daily to check sugars, prediabetes  . loratadine (CLARITIN) 10 MG tablet Take 10 mg by mouth daily.  . methotrexate 25 MG/ML SOLN Inject 8.08 mg into the skin once a week.   . NONFORMULARY OR COMPOUNDED ITEM Estradiol .02% 1 ML Prefilled Applicator Sig: apply vaginally twice a week #90 Day Supply with 4 refills  . PROAIR HFA 108 (90 Base)  MCG/ACT inhaler USE 1-2 PUFFS EVERY 4-6 HOURS AS NEEDED  . ranitidine (ZANTAC) 150 MG tablet Take 1 tablet (150 mg total) by mouth at bedtime as needed for heartburn.  . sertraline (ZOLOFT) 50 MG tablet Take 1 tablet (50 mg total) by mouth daily.  Marland Kitchen SYNTHROID 88 MCG tablet TAKE 1 TABLET DAILY  . tiotropium (SPIRIVA) 18 MCG inhalation capsule Place 1 capsule (18 mcg total) into inhaler and inhale daily.  Marland Kitchen levofloxacin (LEVAQUIN) 500 MG tablet Take 1 tablet (500 mg total) by mouth daily.  . pantoprazole (PROTONIX) 40 MG tablet Take 1 tablet (40 mg total) by mouth daily. (Patient not taking: Reported on 05/03/2017)  .  [DISCONTINUED] nystatin-triamcinolone (MYCOLOG II) cream Apply 1 application topically 3 (three) times daily. (Patient not taking: Reported on 05/03/2017)  . [DISCONTINUED] Vitamin D, Ergocalciferol, (DRISDOL) 50000 units CAPS capsule Take one weekly for 12 weeks then begin one every other week (Patient not taking: Reported on 05/03/2017)   No facility-administered encounter medications on file as of 05/03/2017.      Review of Systems  Constitutional:   No  weight loss, night sweats,  Fevers, chills, fatigue, or  lassitude.  HEENT:   No headaches,  Difficulty swallowing,  Tooth/dental problems, or   +Sore throat,                No sneezing, itching, ear ache, + nasal congestion, post nasal drip,   CV:  No chest pain,  Orthopnea, PND, swelling in lower extremities, anasarca, dizziness, palpitations, syncope.   GI  No heartburn, indigestion, abdominal pain, nausea, vomiting, diarrhea, change in bowel habits, loss of appetite, bloody stools.   Resp:    No chest wall deformity  Skin: no rash or lesions.  GU: no dysuria, change in color of urine, no urgency or frequency.  No flank pain, no hematuria   MS:  No joint pain or swelling.  No decreased range of motion.  No back pain.    Physical Exam  BP 116/64 (BP Location: Left Arm, Cuff Size: Normal)   Pulse 73   Ht '5\' 4"'$  (1.626 m)   Wt 153 lb 6.4 oz (69.6 kg)   SpO2 93%   BMI 26.33 kg/m   GEN: A/Ox3; pleasant , NAD, elderly    HEENT:  [place]/AT,  EACs-clear, TMs-wnl, NOSE-clear, THROAT-clear, no lesions, no postnasal drip or exudate noted.   NECK:  Supple w/ fair ROM; no JVD; normal carotid impulses w/o bruits; no thyromegaly or nodules palpated; no lymphadenopathy.    RESP  Clear  P & A; w/o, wheezes/ rales/ or rhonchi. no accessory muscle use, no dullness to percussion  CARD:  RRR, no m/r/g, no peripheral edema, pulses intact, no cyanosis or clubbing.  GI:   Soft & nt; nml bowel sounds; no organomegaly or masses detected.    Musco: Warm bil, no deformities or joint swelling noted, arthrtiic changes in hands.   Neuro: alert, no focal deficits noted.    Skin: Warm, no lesions or rashes    Lab Results:  CBC    Component Value Date/Time   WBC 7.2 05/06/2016 1421   RBC 3.99 05/06/2016 1421   HGB 12.8 05/06/2016 1421   HGB 12.6 08/10/2011 1457   HCT 38.3 05/06/2016 1421   HCT 36.9 08/10/2011 1457   PLT 243.0 05/06/2016 1421   PLT 188 08/10/2011 1457   MCV 95.8 05/06/2016 1421   MCV 99.5 08/10/2011 1457   MCH 34.0 01/15/2014 1817   MCHC 33.4 05/06/2016  1421   RDW 14.6 05/06/2016 1421   RDW 14.9 (H) 08/10/2011 1457   LYMPHSABS 4.0 05/06/2016 1421   LYMPHSABS 2.9 08/10/2011 1457   MONOABS 0.6 05/06/2016 1421   MONOABS 0.6 08/10/2011 1457   EOSABS 0.1 05/06/2016 1421   EOSABS 0.1 08/10/2011 1457   BASOSABS 0.1 05/06/2016 1421   BASOSABS 0.0 08/10/2011 1457    BMET    Component Value Date/Time   NA 140 11/09/2016 1537   NA 139 08/10/2011 1457   K 3.8 11/09/2016 1537   K 4.4 08/10/2011 1457   CL 103 11/09/2016 1537   CL 98 08/10/2011 1457   CO2 31 11/09/2016 1537   CO2 29 08/10/2011 1457   GLUCOSE 96 11/09/2016 1537   GLUCOSE 98 08/10/2011 1457   BUN 12 11/09/2016 1537   BUN 15 08/10/2011 1457   CREATININE 0.81 11/09/2016 1537   CREATININE 0.9 08/10/2011 1457   CALCIUM 9.5 11/09/2016 1537   CALCIUM 8.9 08/10/2011 1457   GFRNONAA 66 (L) 01/15/2014 1817   GFRAA 77 (L) 01/15/2014 1817    BNP No results found for: BNP  ProBNP    Component Value Date/Time   PROBNP 418.9 01/15/2014 1817    Imaging: Dg Chest 2 View  Result Date: 05/03/2017 CLINICAL DATA:  Cough and congestion with shortness of breath and fever EXAM: CHEST  2 VIEW COMPARISON:  January 24, 2016 FINDINGS: There is patchy infiltrate in the lateral right base. There is postoperative change on the right with volume loss ; there is chronic blunting of the right costophrenic angle with mild chronic elevation of the right  hemidiaphragm. There is scarring with apical pleural thickening in each lung apex, stable. Heart size and pulmonary vascularity are normal. No adenopathy. No bone lesions. IMPRESSION: Patchy infiltrate lateral right base, felt to represent a small focus of pneumonia. Postoperative change with volume loss on the right. Apical scarring and pleural thickening bilaterally, more on the right than on the left, stable. Left lung otherwise clear. Stable cardiac silhouette. Followup PA and lateral chest radiographs recommended in 3-4 weeks following trial of antibiotic therapy to ensure resolution and exclude underlying malignancy. These results will be called to the ordering clinician or representative by the Radiologist Assistant, and communication documented in the PACS or zVision Dashboard. Electronically Signed   By: Lowella Grip III M.D.   On: 05/03/2017 15:11     Assessment & Plan:   CAP (community acquired pneumonia) RLL CAP -  Pt is eating and drinking okay . VSS .  CXR w/ small infiltrate in RLL  Will try OP tx plan with close follow up   Plan  Patient Instructions  Begin Levaquin '500mg'$  daily for 7 days , take with food  Mucinex DM Twice daily  As needed  Cough/congestion  Follow up in 1 week with chest xray and As needed   Please contact office for sooner follow up if symptoms do not improve or worsen or seek emergency care      COPD (chronic obstructive pulmonary disease) ([place]) Mild flare with PNA  Hold on steroids for now   Plan  Patient Instructions  Begin Levaquin '500mg'$  daily for 7 days , take with food  Mucinex DM Twice daily  As needed  Cough/congestion  Follow up in 1 week with chest xray and As needed   Please contact office for sooner follow up if symptoms do not improve or worsen or seek emergency care         Marvin Grabill  Sharese Manrique, NP 05/03/2017

## 2017-05-03 NOTE — Patient Instructions (Addendum)
Begin Levaquin '500mg'$  daily for 7 days , take with food  Mucinex DM Twice daily  As needed  Cough/congestion  Follow up in 1 week with chest xray and As needed   Please contact office for sooner follow up if symptoms do not improve or worsen or seek emergency care

## 2017-05-03 NOTE — Assessment & Plan Note (Addendum)
RLL CAP -  Pt is eating and drinking okay . VSS .  CXR w/ small infiltrate in RLL  Will try OP tx plan with close follow up   Plan  Patient Instructions  Begin Levaquin '500mg'$  daily for 7 days , take with food  Mucinex DM Twice daily  As needed  Cough/congestion  Follow up in 1 week with chest xray and As needed   Please contact office for sooner follow up if symptoms do not improve or worsen or seek emergency care

## 2017-05-03 NOTE — Assessment & Plan Note (Signed)
Mild flare with PNA  Hold on steroids for now   Plan  Patient Instructions  Begin Levaquin '500mg'$  daily for 7 days , take with food  Mucinex DM Twice daily  As needed  Cough/congestion  Follow up in 1 week with chest xray and As needed   Please contact office for sooner follow up if symptoms do not improve or worsen or seek emergency care

## 2017-05-03 NOTE — Telephone Encounter (Signed)
I have spoken with Rom with [place] radiology, who states Dr. Jasmine December wanted to make TP aware that XRAY results were ready for review.   TP please advise. Thanks.

## 2017-05-05 NOTE — Progress Notes (Signed)
Reviewed, agree 

## 2017-05-10 ENCOUNTER — Ambulatory Visit: Payer: Medicare Other | Admitting: Neurology

## 2017-05-10 ENCOUNTER — Other Ambulatory Visit: Payer: Self-pay

## 2017-05-10 NOTE — Telephone Encounter (Signed)
Pt called and cancelled same day appt d/t pneumonia.

## 2017-05-11 NOTE — Progress Notes (Signed)
Pre visit review using our clinic review tool, if applicable. No additional management support is needed unless otherwise documented below in the visit note. 

## 2017-05-11 NOTE — Progress Notes (Addendum)
Subjective:   Lisa Vega is a 83 y.o. female who presents for Medicare Annual (Subsequent) preventive examination.  Review of Systems:  No ROS.  Medicare Wellness Visit.   Cardiac Risk Factors include: advanced age (>94mn, >>64women);dyslipidemia Sleep patterns: States she has not been  Sleeping well lately,  gets up 1-2 times nightly to void and sleeps 5-6 hours nightly.  Patient reports insomnia issues, discussed recommended sleep tips and stress reduction tips, education was attached to patient's AVS.  Home Safety/Smoke Alarms: Feels safe in home. Smoke alarms in place.    Living environment; residence and Firearm Safety: [place] can live on one level, no firearms. Lives with supportive daughter, no needs for DME at this time Seat Belt Safety/Bike Helmet: Wears seat belt.   Counseling:   Eye Exam- appointment yearly  Dental- dentures  Female:   Pap- N/A     Mammo- Last 05/31/00, BI-RADS category 1: negative     Dexa scan- Last 11/17/16, normal    CCS- N/A     Objective:     Vitals: BP (!) 142/84   Pulse 67   Temp 98 F (36.7 C) (Oral)   Resp 16   Wt 155 lb (70.3 kg)   SpO2 96%   BMI 26.61 kg/m   Body mass index is 26.61 kg/m.   Tobacco History  Smoking Status  . Former Smoker  . Packs/day: 1.00  . Years: 20.00  . Types: Cigarettes  . Quit date: 11/10/2004  Smokeless Tobacco  . Never Used     Counseling given: Not Answered   Past Medical History:  Diagnosis Date  . Cancer [place]    lung ca  . [place]   . Dyspnea   . GERD (gastroesophageal reflux disease)   . Hypercholesteremia   . Memory difficulties 10/28/2015  . Rheumatoid arthritis(714.0)   . Thyroid disease   . Tremor 10/28/2015   Jaw tremor  . Uterine prolapse   . Vitamin D deficiency    Past Surgical History:  Procedure Laterality Date  . APPENDECTOMY    . HEMORRHOID SURGERY    . right lobectomy  10/2004  . TONSILLECTOMY AND  ADENOIDECTOMY     Family History  Problem Relation Age of Onset  . Emphysema Mother   . Asthma Mother   . Lung cancer Father   . Pancreatic cancer Father   . Bone cancer Father   . Heart disease Brother        x1  . Heart disease Sister        x2  . Stroke Maternal Grandfather    History  Sexual Activity  . Sexual activity: Not Currently    Outpatient Encounter Prescriptions as of 05/12/2017  Medication Sig  . acetaminophen (TYLENOL) 325 MG tablet Take 650 mg by mouth every 6 (six) hours as needed.  . budesonide-formoterol (SYMBICORT) 160-4.5 MCG/ACT inhaler Needs OV for further refills.  . Cholecalciferol (VITAMIN D) 2000 units CAPS Take 2,000 Units by mouth daily.  . diazepam (VALIUM) 5 MG tablet Take 0.5 tablets (2.5 mg total) by mouth 2 (two) times daily as needed for anxiety.  . folic acid (FOLVITE) 1 MG tablet Take 1 mg by mouth daily.  .Marland Kitchengabapentin (NEURONTIN) 100 MG capsule Take 100 mg by mouth 3 (three) times daily.   .Marland Kitchenglucose blood (FREESTYLE LITE) test strip Use as instructed once daily to check sugars, prediabetes  . loratadine (CLARITIN) 10 MG tablet Take 10 mg  by mouth daily.  . methotrexate 25 MG/ML SOLN Inject 8.08 mg into the skin once a week.   . NONFORMULARY OR COMPOUNDED ITEM Estradiol .02% 1 ML Prefilled Applicator Sig: apply vaginally twice a week #90 Day Supply with 4 refills  . PROAIR HFA 108 (90 Base) MCG/ACT inhaler USE 1-2 PUFFS EVERY 4-6 HOURS AS NEEDED  . ranitidine (ZANTAC) 150 MG tablet Take 1 tablet (150 mg total) by mouth at bedtime as needed for heartburn.  Marland Kitchen SYNTHROID 88 MCG tablet TAKE 1 TABLET DAILY  . tiotropium (SPIRIVA) 18 MCG inhalation capsule Place 1 capsule (18 mcg total) into inhaler and inhale daily.  . [DISCONTINUED] sertraline (ZOLOFT) 50 MG tablet Take 1 tablet (50 mg total) by mouth daily.  . fluconazole (DIFLUCAN) 150 MG tablet Take 1 tablet (150 mg total) by mouth daily.  Marland Kitchen omeprazole (PRILOSEC) 40 MG capsule Take 1 capsule  (40 mg total) by mouth daily.  . sertraline (ZOLOFT) 100 MG tablet Take 1 tablet (100 mg total) by mouth daily.  . [DISCONTINUED] fluconazole (DIFLUCAN) 150 MG tablet Take 1 tablet (150 mg total) by mouth daily.  . [DISCONTINUED] levofloxacin (LEVAQUIN) 500 MG tablet Take 1 tablet (500 mg total) by mouth daily.  . [DISCONTINUED] omeprazole (PRILOSEC) 40 MG capsule Take 1 capsule (40 mg total) by mouth daily.  . [DISCONTINUED] pantoprazole (PROTONIX) 40 MG tablet Take 1 tablet (40 mg total) by mouth daily. (Patient not taking: Reported on 05/03/2017)   No facility-administered encounter medications on file as of 05/12/2017.     Activities of Daily Living In your present state of health, do you have any difficulty performing the following activities: 05/12/2017 10/19/2016  Hearing? N N  Vision? N N  Difficulty concentrating or making decisions? N N  Walking or climbing stairs? N N  Dressing or bathing? N N  Doing errands, shopping? N N  Preparing Food and eating ? N -  Using the Toilet? N -  In the past six months, have you accidently leaked urine? N -  Do you have problems with loss of bowel control? N -  Managing your Medications? N -  Managing your Finances? N -  Housekeeping or managing your Housekeeping? N -  Some recent data might be hidden    Patient Care Team: Binnie Rail, MD as PCP - General (Internal Medicine) Clarene Essex, MD (Gastroenterology) Izora Gala, MD as Consulting Physician (Otolaryngology) Lenon Oms, MD as Referring Physician (Obstetrics and Gynecology) Juanito Doom, MD as Consulting Physician (Pulmonary Disease) Curt Bears, MD as Consulting Physician (Oncology)    Assessment:    Physical assessment deferred to PCP.  Exercise Activities and Dietary recommendations Current Exercise Habits: The patient does not participate in regular exercise at present, Exercise limited by: Other - see comments (rheumatoid arthritis)  Diet (meal  preparation, eat out, water intake, caffeinated beverages, dairy products, fruits and vegetables): in general, a "healthy" diet  , patient states she has had difficulty with nausea and poor appetite. She states she is staying hydrated and drinks 3-4 glasses of water daily, 1-2 glasses of juice.   Discussed the BRAT diet and encouraged patient to eat small frequent meals, discussed supplementing diet with ensure if unable to eat meals.  Educated about high fiber diet to help with chronic constipation and encouraged patient to increase daily water intake. Diet education was attached to patient's AVS.   Goals    . To get to feeling better  Try the medicine that the doctor prescribed, go to the new rheumatologist.       Fall Risk Fall Risk  05/12/2017 05/06/2016 02/26/2016 07/27/2014  Falls in the past year? No No No No   Depression Screen PHQ 2/9 Scores 05/12/2017 05/06/2016 12/13/2014 07/27/2014  PHQ - 2 Score 2 0 1 3  PHQ- 9 Score 6 - 4 6     Cognitive Function MMSE - Mini Mental State Exam 10/28/2015  Orientation to time 5  Orientation to Place 5  Registration 3  Attention/ Calculation 0  Recall 3  Language- name 2 objects 2  Language- repeat 1  Language- follow 3 step command 3  Language- read & follow direction 1  Write a sentence 1  Copy design 1  Total score 25       Ad8 score reviewed for issues:  Issues making decisions:no  Less interest in hobbies / activities: yes  Repeats questions, stories (family complaining): no  Trouble using ordinary gadgets (microwave, computer, phone): no  Forgets the month or year: no  Mismanaging finances: no  Remembering appts: no  Daily problems with thinking and/or memory: yes Ad8 score is=2     Immunization History  Administered Date(s) Administered  . Influenza Split 09/28/2011, 09/28/2015  . Influenza Whole 09/27/2012  . Influenza, High Dose Seasonal PF 10/28/2016, 01/12/2017  . Influenza,inj,Quad PF,36+ Mos  09/20/2013, 09/12/2014  . Pneumococcal Conjugate-13 10/24/2015  . Pneumococcal Polysaccharide-23 12/29/2007   Screening Tests Health Maintenance  Topic Date Due  . TETANUS/TDAP  09/27/2017 (Originally 09/25/1951)  . INFLUENZA VACCINE  07/28/2017  . DEXA SCAN  Completed  . PNA vac Low Risk Adult  Completed      Plan:     [facility] will [facility]. Will try to become more involved in social activities.  I have personally reviewed and noted the following in the patient's chart:   . Medical and social history . Use of alcohol, tobacco or illicit drugs  . Current medications and supplements . Functional ability and status . Nutritional status . Physical activity . Advanced directives . List of other physicians . Vitals . Screenings to include cognitive, depression, and falls . Referrals and appointments  In addition, I have reviewed and discussed with patient certain preventive protocols, quality metrics, and best practice recommendations. A written personalized care plan for preventive services as well as general preventive health recommendations were provided to patient.     Michiel Cowboy, RN  05/12/2017   Medical screening examination/treatment/procedure(s) were performed by non-physician practitioner and as supervising physician I was immediately available for consultation/collaboration. I agree with above. Binnie Rail, MD

## 2017-05-12 ENCOUNTER — Other Ambulatory Visit (INDEPENDENT_AMBULATORY_CARE_PROVIDER_SITE_OTHER): Payer: Medicare Other

## 2017-05-12 ENCOUNTER — Telehealth: Payer: Self-pay | Admitting: Adult Health

## 2017-05-12 ENCOUNTER — Encounter: Payer: Self-pay | Admitting: Neurology

## 2017-05-12 ENCOUNTER — Encounter: Payer: Self-pay | Admitting: Gynecology

## 2017-05-12 ENCOUNTER — Ambulatory Visit (INDEPENDENT_AMBULATORY_CARE_PROVIDER_SITE_OTHER): Payer: Medicare Other | Admitting: Internal Medicine

## 2017-05-12 VITALS — BP 142/84 | HR 67 | Temp 98.0°F | Resp 16 | Wt 155.0 lb

## 2017-05-12 DIAGNOSIS — K219 Gastro-esophageal reflux disease without esophagitis: Secondary | ICD-10-CM | POA: Diagnosis not present

## 2017-05-12 DIAGNOSIS — F418 Other specified anxiety disorders: Secondary | ICD-10-CM

## 2017-05-12 DIAGNOSIS — M05741 Rheumatoid arthritis with rheumatoid factor of right hand without organ or systems involvement: Secondary | ICD-10-CM

## 2017-05-12 DIAGNOSIS — Z0001 Encounter for general adult medical examination with abnormal findings: Secondary | ICD-10-CM

## 2017-05-12 DIAGNOSIS — R7303 Prediabetes: Secondary | ICD-10-CM

## 2017-05-12 DIAGNOSIS — M05742 Rheumatoid arthritis with rheumatoid factor of left hand without organ or systems involvement: Secondary | ICD-10-CM

## 2017-05-12 DIAGNOSIS — E211 Secondary hyperparathyroidism, not elsewhere classified: Secondary | ICD-10-CM

## 2017-05-12 DIAGNOSIS — E038 Other specified hypothyroidism: Secondary | ICD-10-CM

## 2017-05-12 DIAGNOSIS — R5383 Other fatigue: Secondary | ICD-10-CM

## 2017-05-12 DIAGNOSIS — Z0000 Encounter for general adult medical examination without abnormal findings: Secondary | ICD-10-CM

## 2017-05-12 LAB — CBC WITH DIFFERENTIAL/PLATELET
Basophils Absolute: 0.1 10*3/uL (ref 0.0–0.1)
Basophils Relative: 1.7 % (ref 0.0–3.0)
Eosinophils Absolute: 0.2 10*3/uL (ref 0.0–0.7)
Eosinophils Relative: 3 % (ref 0.0–5.0)
HCT: 38 % (ref 36.0–46.0)
Hemoglobin: 12.6 g/dL (ref 12.0–15.0)
Lymphocytes Relative: 50.1 % — ABNORMAL HIGH (ref 12.0–46.0)
Lymphs Abs: 3.1 10*3/uL (ref 0.7–4.0)
MCHC: 33 g/dL (ref 30.0–36.0)
MCV: 96.7 fl (ref 78.0–100.0)
Monocytes Absolute: 0.8 10*3/uL (ref 0.1–1.0)
Monocytes Relative: 12.4 % — ABNORMAL HIGH (ref 3.0–12.0)
Neutro Abs: 2 10*3/uL (ref 1.4–7.7)
Neutrophils Relative %: 32.8 % — ABNORMAL LOW (ref 43.0–77.0)
Platelets: 269 10*3/uL (ref 150.0–400.0)
RBC: 3.93 Mil/uL (ref 3.87–5.11)
RDW: 14.5 % (ref 11.5–15.5)
WBC: 6.2 10*3/uL (ref 4.0–10.5)

## 2017-05-12 LAB — COMPREHENSIVE METABOLIC PANEL
ALT: 8 U/L (ref 0–35)
AST: 13 U/L (ref 0–37)
Albumin: 3.7 g/dL (ref 3.5–5.2)
Alkaline Phosphatase: 75 U/L (ref 39–117)
BUN: 10 mg/dL (ref 6–23)
CO2: 33 mEq/L — ABNORMAL HIGH (ref 19–32)
Calcium: 9.4 mg/dL (ref 8.4–10.5)
Chloride: 101 mEq/L (ref 96–112)
Creatinine, Ser: 0.85 mg/dL (ref 0.40–1.20)
GFR: 67.95 mL/min (ref >60.00)
Glucose, Bld: 106 mg/dL — ABNORMAL HIGH (ref 70–99)
Potassium: 3.5 mEq/L (ref 3.5–5.1)
Sodium: 138 mEq/L (ref 135–145)
Total Bilirubin: 0.5 mg/dL (ref 0.2–1.2)
Total Protein: 7.3 g/dL (ref 6.0–8.3)

## 2017-05-12 LAB — HEMOGLOBIN A1C: Hgb A1c MFr Bld: 6.7 % — ABNORMAL HIGH (ref 4.6–6.5)

## 2017-05-12 LAB — VITAMIN D 25 HYDROXY (VIT D DEFICIENCY, FRACTURES): VITD: 25.86 ng/mL — ABNORMAL LOW (ref 30.00–100.00)

## 2017-05-12 LAB — TSH: TSH: 1.4 u[IU]/mL (ref 0.35–4.50)

## 2017-05-12 MED ORDER — OMEPRAZOLE 40 MG PO CPDR: 40.0000 mg | DELAYED_RELEASE_CAPSULE | Freq: Every day | ORAL | 3 refills | Status: DC

## 2017-05-12 MED ORDER — FLUCONAZOLE 150 MG PO TABS: 150.0000 mg | ORAL_TABLET | Freq: Every day | ORAL | 0 refills | Status: DC

## 2017-05-12 MED ORDER — SERTRALINE HCL 100 MG PO TABS: 100.0000 mg | ORAL_TABLET | Freq: Every day | ORAL | 1 refills | Status: DC

## 2017-05-12 NOTE — Telephone Encounter (Signed)
Spoke with the pt  I advised just keep planned ov for Friday, b/c TP wanted to see her as well as repeat the cxr  She understands and will keep planned ov  Nothing further needed

## 2017-05-12 NOTE — Assessment & Plan Note (Signed)
Check tsh  Titrate med dose if needed  

## 2017-05-12 NOTE — Patient Instructions (Addendum)
Test(s) ordered today. Your results will be released to [place] (or called to you) after review, usually within 72hours after test completion. If any changes need to be made, you will be notified at that same time.   Medications reviewed and updated.  Changes include restarting omeprazole (prilosec) 40 mg daily.  We will increase the zoloft to 100 mg daily.  dilfucan was sent to your pharmacy.   Your prescription(s) have been submitted to your pharmacy. Please take as directed and contact our office if you believe you are having problem(s) with the medication(s).  A referral was ordered for rheumatology - Dr Estanislado Pandy.  Please followup in 1 month  Oatmeal as a high fiber diet High-Fiber Diet Fiber, also called dietary fiber, is a type of carbohydrate found in fruits, vegetables, whole grains, and beans. A high-fiber diet can have many health benefits. Your health care provider may recommend a high-fiber diet to help:  Prevent constipation. Fiber can make your bowel movements more regular.  Lower your cholesterol.  Relieve hemorrhoids, uncomplicated diverticulosis, or irritable bowel syndrome.  Prevent overeating as part of a weight-loss plan.  Prevent heart disease, type 2 diabetes, and certain cancers. What is my plan? The recommended daily intake of fiber includes:  38 grams for men under age 36.  28 grams for men over age 21.  15 grams for women under age 64.  40 grams for women over age 20. You can get the recommended daily intake of dietary fiber by eating a variety of fruits, vegetables, grains, and beans. Your health care provider may also recommend a fiber supplement if it is not possible to get enough fiber through your diet. What do I need to know about a high-fiber diet?  Fiber supplements have not been widely studied for their effectiveness, so it is better to get fiber through food sources.  Always check the fiber content on thenutrition facts label of any  prepackaged food. Look for foods that contain at least 5 grams of fiber per serving.  Ask your dietitian if you have questions about specific foods that are related to your condition, especially if those foods are not listed in the following section.  Increase your daily fiber consumption gradually. Increasing your intake of dietary fiber too quickly may cause bloating, cramping, or gas.  Drink plenty of water. Water helps you to digest fiber. What foods can I eat? Grains  Whole-grain breads. Multigrain cereal. Oats and oatmeal. Brown rice. Barley. Bulgur wheat. [place]. Bran muffins. Popcorn. Rye wafer crackers. Vegetables  Sweet potatoes. Spinach. Kale. Artichokes. Cabbage. Broccoli. Green peas. Carrots. Squash. Fruits  Berries. Pears. Apples. Oranges. Avocados. Prunes and raisins. Dried figs. Meats and Other Protein Sources  [place], kidney, pinto, and soy beans. Split peas. Lentils. Nuts and seeds. Dairy  Fiber-fortified yogurt. Beverages  Fiber-fortified soy milk. Fiber-fortified orange juice. Other  Fiber bars. The items listed above may not be a complete list of recommended foods or beverages. Contact your dietitian for more options.  What foods are not recommended? Grains  White bread. Pasta made with refined flour. White rice. Vegetables  Fried potatoes. Canned vegetables. Well-cooked vegetables. Fruits  Fruit juice. Cooked, strained fruit. Meats and Other Protein Sources  Fatty cuts of meat. Fried Sales executive or fried fish. Dairy  Milk. Yogurt. Cream cheese. Sour cream. Beverages  Soft drinks. Other  Cakes and pastries. Butter and oils. The items listed above may not be a complete list of foods and beverages to avoid. Contact your  dietitian for more information.  What are some tips for including high-fiber foods in my diet?  Eat a wide variety of high-fiber foods.  Make sure that half of all grains consumed each day are whole grains.  Replace breads and cereals made  from refined flour or white flour with whole-grain breads and cereals.  Replace white rice with brown rice, bulgur wheat, or millet.  Start the day with a breakfast that is high in fiber, such as a cereal that contains at least 5 grams of fiber per serving.  Use beans in place of meat in soups, salads, or pasta.  Eat high-fiber snacks, such as berries, raw vegetables, nuts, or popcorn. This information is not intended to replace advice given to you by your health care provider. Make sure you discuss any questions you have with your health care provider. Document Released: 12/14/2005 Document Revised: 05/21/2016 Document Reviewed: 05/29/2014 Elsevier Interactive Patient Education  2017 [place]. Insomnia Insomnia is a sleep disorder that makes it difficult to fall asleep or to stay asleep. Insomnia can cause tiredness (fatigue), low energy, difficulty concentrating, mood swings, and poor performance at work or school. There are three different ways to classify insomnia:  Difficulty falling asleep.  Difficulty staying asleep.  Waking up too early in the morning. Any type of insomnia can be long-term (chronic) or short-term (acute). Both are common. Short-term insomnia usually lasts for three months or less. Chronic insomnia occurs at least three times a week for longer than three months. What are the causes? Insomnia may be caused by another condition, situation, or substance, such as:  Anxiety.  Certain medicines.  Gastroesophageal reflux disease (GERD) or other gastrointestinal conditions.  Asthma or other breathing conditions.  Restless legs syndrome, sleep apnea, or other sleep disorders.  Chronic pain.  Menopause. This may include hot flashes.  Stroke.  Abuse of alcohol, tobacco, or illegal drugs.  Depression.  Caffeine.  Neurological disorders, such as Alzheimer disease.  An overactive thyroid (hyperthyroidism). The cause of insomnia may not be known. What  increases the risk? Risk factors for insomnia include:  Gender. Women are more commonly affected than men.  Age. Insomnia is more common as you get older.  Stress. This may involve your professional or personal life.  Income. Insomnia is more common in people with lower income.  Lack of exercise.  Irregular work schedule or night shifts.  Traveling between different time zones. What are the signs or symptoms? If you have insomnia, trouble falling asleep or trouble staying asleep is the main symptom. This may lead to other symptoms, such as:  Feeling fatigued.  Feeling nervous about going to sleep.  Not feeling rested in the morning.  Having trouble concentrating.  Feeling irritable, anxious, or depressed. How is this treated? Treatment for insomnia depends on the cause. If your insomnia is caused by an underlying condition, treatment will focus on addressing the condition. Treatment may also include:  Medicines to help you sleep.  Counseling or therapy.  Lifestyle adjustments. Follow these instructions at home:  Take medicines only as directed by your health care provider.  Keep regular sleeping and waking hours. Avoid naps.  Keep a sleep diary to help you and your health care provider figure out what could be causing your insomnia. Include:  When you sleep.  When you wake up during the night.  How well you sleep.  How rested you feel the next day.  Any side effects of medicines you are taking.  What you  eat and drink.  Make your bedroom a comfortable place where it is easy to fall asleep:  Put up shades or special blackout curtains to block light from outside.  Use a white noise machine to block noise.  Keep the temperature cool.  Exercise regularly as directed by your health care provider. Avoid exercising right before bedtime.  Use relaxation techniques to manage stress. Ask your health care provider to suggest some techniques that may work well  for you. These may include:  Breathing exercises.  Routines to release muscle tension.  Visualizing peaceful scenes.  Cut back on alcohol, caffeinated beverages, and cigarettes, especially close to bedtime. These can disrupt your sleep.  Do not overeat or eat spicy foods right before bedtime. This can lead to digestive discomfort that can make it hard for you to sleep.  Limit screen use before bedtime. This includes:  Watching TV.  Using your smartphone, tablet, and computer.  Stick to a routine. This can help you fall asleep faster. Try to do a quiet activity, brush your teeth, and go to bed at the same time each night.  Get out of bed if you are still awake after 15 minutes of trying to sleep. Keep the lights down, but try reading or doing a quiet activity. When you feel sleepy, go back to bed.  Make sure that you drive carefully. Avoid driving if you feel very sleepy.  Keep all follow-up appointments as directed by your health care provider. This is important. Contact a health care provider if:  You are tired throughout the day or have trouble in your daily routine due to sleepiness.  You continue to have sleep problems or your sleep problems get worse. Get help right away if:  You have serious thoughts about hurting yourself or someone else. This information is not intended to replace advice given to you by your health care provider. Make sure you discuss any questions you have with your health care provider. Document Released: 12/11/2000 Document Revised: 05/15/2016 Document Reviewed: 09/14/2014 Elsevier Interactive Patient Education  2017 [place] and Stress Management Stress is a normal reaction to life events. It is what you feel when life demands more than you are used to or more than you can handle. Some stress can be useful. For example, the stress reaction can help you catch the last bus of the day, study for a test, or meet a deadline at work. But  stress that occurs too often or for too long can cause problems. It can affect your emotional health and interfere with relationships and normal daily activities. Too much stress can weaken your immune system and increase your risk for physical illness. If you already have a medical problem, stress can make it worse. What are the causes? All sorts of life events may cause stress. An event that causes stress for one person may not be stressful for another person. Major life events commonly cause stress. These may be positive or negative. Examples include losing your job, moving into a new home, getting married, having a baby, or losing a loved one. Less obvious life events may also cause stress, especially if they occur day after day or in combination. Examples include working long hours, driving in traffic, caring for children, being in debt, or being in a difficult relationship. What are the signs or symptoms? Stress may cause emotional symptoms including, the following:  Anxiety. This is feeling worried, afraid, on edge, overwhelmed, or out of control.  Anger.  This is feeling irritated or impatient.  Depression. This is feeling sad, down, helpless, or guilty.  Difficulty focusing, remembering, or making decisions. Stress may cause physical symptoms, including the following:  Aches and pains. These may affect your head, neck, back, stomach, or other areas of your body.  Tight muscles or clenched jaw.  Low energy or trouble sleeping. Stress may cause unhealthy behaviors, including the following:  Eating to feel better (overeating) or skipping meals.  Sleeping too little, too much, or both.  Working too much or putting off tasks (procrastination).  Smoking, drinking alcohol, or using drugs to feel better. How is this diagnosed? Stress is diagnosed through an assessment by your health care provider. Your health care provider will ask questions about your symptoms and any stressful life  events.Your health care provider will also ask about your medical history and may order blood tests or other tests. Certain medical conditions and medicine can cause physical symptoms similar to stress. Mental illness can cause emotional symptoms and unhealthy behaviors similar to stress. Your health care provider may refer you to a mental health professional for further evaluation. How is this treated? Stress management is the recommended treatment for stress.The goals of stress management are reducing stressful life events and coping with stress in healthy ways. Techniques for reducing stressful life events include the following:  Stress identification. Self-monitor for stress and identify what causes stress for you. These skills may help you to avoid some stressful events.  Time management. Set your priorities, keep a calendar of events, and learn to say "no." These tools can help you avoid making too many commitments. Techniques for coping with stress include the following:  Rethinking the problem. Try to think realistically about stressful events rather than ignoring them or overreacting. Try to find the positives in a stressful situation rather than focusing on the negatives.  Exercise. Physical exercise can release both physical and emotional tension. The key is to find a form of exercise you enjoy and do it regularly.  Relaxation techniques. These relax the body and mind. Examples include yoga, meditation, tai chi, biofeedback, deep breathing, progressive muscle relaxation, listening to music, being out in nature, journaling, and other hobbies. Again, the key is to find one or more that you enjoy and can do regularly.  Healthy lifestyle. Eat a balanced diet, get plenty of sleep, and do not smoke. Avoid using alcohol or drugs to relax.  Strong support network. Spend time with family, friends, or other people you enjoy being around.Express your feelings and talk things over with someone  you trust. Counseling or talktherapy with a mental health professional may be helpful if you are having difficulty managing stress on your own. Medicine is typically not recommended for the treatment of stress.Talk to your health care provider if you think you need medicine for symptoms of stress. Follow these instructions at home:  Keep all follow-up visits as directed by your health care provider.  Take all medicines as directed by your health care provider. Contact a health care provider if:  Your symptoms get worse or you start having new symptoms.  You feel overwhelmed by your problems and can no longer manage them on your own. Get help right away if:  You feel like hurting yourself or someone else. This information is not intended to replace advice given to you by your health care provider. Make sure you discuss any questions you have with your health care provider. Document Released: 06/09/2001 Document Revised: 05/21/2016 Document Reviewed:  08/08/2013 Elsevier Interactive Patient Education  2017 [employer].

## 2017-05-12 NOTE — Assessment & Plan Note (Signed)
Check PTH, vitamin D, cmp

## 2017-05-12 NOTE — Progress Notes (Signed)
Subjective:    Patient ID: Lisa Vega, female    DOB: 09/10/1932, 83 y.o.   MRN: [id number]  HPI The patient is here for follow up.  Prediabetes:  She is compliant with a low sugar/carbohydrate diet.  She is Not exercising regularly.  GERD:  The heartburn was not controlled we tried switching her to pantoprazole, but she did not tolerate it. She resumed omeprazole 20 mg daily, but the pharmacy will not refill without a prescription. She does feel heartburn on a frequent basis and has vomited. He is also experiencing abdominal discomfort and constipation.  Hypothyroidism:  She is taking her medication daily.  She is extremely fatigued. She is unsure if this is related to her thyroid or something else.  Depression, anxiety: She is taking her zoloft daily as prescribed. She takes valium as needed. She denies any side effects from the medication.  She feels depressed and anxious. She does not feel that the Valium is working as well. She is currently living with her daughter and at first that transition difficult, but now she knows decision.  Headaches:  She has been having headaches.  She has never been a headache person and these started two months ago.  They are sometimes go from back o the head to the front and sometimes go from the front to the back of the head.  Her energy level is low. Two nights ago she slept all night and in the morning slept for 5 hours. She does not understand why her energy level is still low.  RA:  Her arthritis is not well controlled.  She is the methotrexate but is not convinced that is working. She would like to see a different rheumatologist.  Her appetite is poor.    Medications and allergies reviewed with patient and updated if appropriate.  Patient Active Problem List   Diagnosis Date Noted  . CAP (community acquired pneumonia) 05/03/2017  . Orthopnea 01/12/2017  . GERD (gastroesophageal reflux disease) 11/09/2016  . Age-related osteoporosis  without current pathological fracture 10/14/2016  . Osteoporosis 05/06/2016  . Depression with anxiety 05/06/2016  . Hiatal hernia 05/06/2016  . Hypothyroidism 05/06/2016  . Prediabetes 05/06/2016  . Tremor 10/28/2015  . Memory difficulties 10/28/2015  . Hoarseness 10/14/2015  . History of vitamin D deficiency 05/02/2015  . Other constipation 01/29/2015  . Abdominal pain, chronic, epigastric 11/01/2014  . 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 ([place]) 05/30/2014  . Diverticulitis of colon without hemorrhage 08/07/2013  . Prolapse of female pelvic organs 06/22/2013  . Neck pain 03/10/2013  . Vaginal atrophy 03/09/2013  . Rectocele 03/09/2013  . Cystocele 03/09/2013  . Female pelvic pain 03/09/2013  . Urinary incontinence 03/09/2013  . COPD (chronic obstructive pulmonary disease) ([place]) 01/16/2011  . CARCINOMA, LUNG, NONSMALL CELL 12/17/2010  . Rheumatoid arthritis ([place]) 12/17/2010  . DYSPNEA 12/17/2010    Current Outpatient Prescriptions on File Prior to Visit  Medication Sig Dispense Refill  . acetaminophen (TYLENOL) 325 MG tablet Take 650 mg by mouth every 6 (six) hours as needed.    . budesonide-formoterol (SYMBICORT) 160-4.5 MCG/ACT inhaler Needs OV for further refills. 3 Inhaler 3  . Cholecalciferol (VITAMIN D) 2000 units CAPS Take 2,000 Units by mouth daily.    . diazepam (VALIUM) 5 MG tablet Take 0.5 tablets (2.5 mg total) by mouth 2 (two) times daily as needed for anxiety. 30 tablet 3  . folic acid (FOLVITE) 1 MG tablet Take 1 mg by mouth daily.    Marland Kitchen  gabapentin (NEURONTIN) 100 MG capsule Take 100 mg by mouth 3 (three) times daily.     Marland Kitchen glucose blood (FREESTYLE LITE) test strip Use as instructed once daily to check sugars, prediabetes 100 each 3  . loratadine (CLARITIN) 10 MG tablet Take 10 mg by mouth daily.    . methotrexate 25 MG/ML SOLN Inject 8.08 mg into the skin once a week.     . NONFORMULARY OR COMPOUNDED ITEM Estradiol .02% 1 ML Prefilled Applicator Sig:  apply vaginally twice a week #90 Day Supply with 4 refills 1 each 3  . PROAIR HFA 108 (90 Base) MCG/ACT inhaler USE 1-2 PUFFS EVERY 4-6 HOURS AS NEEDED 8.5 g 1  . ranitidine (ZANTAC) 150 MG tablet Take 1 tablet (150 mg total) by mouth at bedtime as needed for heartburn. 90 tablet 1  . sertraline (ZOLOFT) 50 MG tablet Take 1 tablet (50 mg total) by mouth daily. 90 tablet 1  . SYNTHROID 88 MCG tablet TAKE 1 TABLET DAILY 90 tablet 0  . tiotropium (SPIRIVA) 18 MCG inhalation capsule Place 1 capsule (18 mcg total) into inhaler and inhale daily. 90 capsule 3   No current facility-administered medications on file prior to visit.     Past Medical History:  Diagnosis Date  . Cancer ([place])    lung ca  . COPD (chronic obstructive pulmonary disease) ([place])   . Dyspnea   . GERD (gastroesophageal reflux disease)   . Hypercholesteremia   . Memory difficulties 10/28/2015  . Rheumatoid arthritis(714.0)   . Thyroid disease   . Tremor 10/28/2015   Jaw tremor  . Uterine prolapse   . Vitamin D deficiency     Past Surgical History:  Procedure Laterality Date  . APPENDECTOMY    . HEMORRHOID SURGERY    . right lobectomy  10/2004  . TONSILLECTOMY AND ADENOIDECTOMY      Social History   Social History  . Marital status: Married    Spouse name: N/A  . Number of children: 3  . Years of education: LPN   Occupational History  . retired Retired    Corporate treasurer at [employer] Topics  . Smoking status: Former Smoker    Packs/day: 1.00    Years: 20.00    Types: Cigarettes    Quit date: 11/10/2004  . Smokeless tobacco: Never Used  . Alcohol use 0.0 oz/week     Comment: socially  . Drug use: No  . Sexual activity: Not Currently   Other Topics Concern  . Not on file   Social History Narrative   Patient drinks about 3 cups of coffee daily.   Patient is right handed.     Family History  Problem Relation Age of Onset  . Emphysema Mother   . Asthma Mother   .  Lung cancer Father   . Pancreatic cancer Father   . Bone cancer Father   . Heart disease Brother        x1  . Heart disease Sister        x2  . Stroke Maternal Grandfather     Review of Systems  Constitutional: Negative for fever.  Respiratory: Positive for cough (decreased - clear sputum), shortness of breath (chronic, slightly more - getting better) and wheezing (minimal).   Cardiovascular: Negative for chest pain, palpitations and leg swelling.  Gastrointestinal: Positive for abdominal pain (epigastric region, RUQ, suprapubic region), constipation and vomiting (? gerd related).       GERD  Genitourinary: Positive for hematuria (mild, chronic - w/u negative). Negative for dysuria.  Neurological: Positive for light-headedness and headaches.  Psychiatric/Behavioral: Positive for dysphoric mood. The patient is nervous/anxious.        Objective:   Vitals:   05/12/17 1439  BP: (!) 142/84  Pulse: 67  Resp: 16  Temp: 98 F (36.7 C)   Wt Readings from Last 3 Encounters:  05/12/17 155 lb (70.3 kg)  05/03/17 153 lb 6.4 oz (69.6 kg)  04/15/17 157 lb 6.4 oz (71.4 kg)   Body mass index is 26.61 kg/m.   Physical Exam    Constitutional: Appears well-developed and well-nourished. No distress.  HENT:  Head: Normocephalic and atraumatic.  Neck: Neck supple. No tracheal deviation present. No thyromegaly present.  No cervical lymphadenopathy Cardiovascular: Normal rate, regular rhythm and normal heart sounds.   No murmur heard. No carotid bruit .  No edema Pulmonary/Chest: Effort normal and breath sounds normal. No respiratory distress. No has no wheezes. No rales.  Skin: Skin is warm and dry. Not diaphoretic.  Psychiatric: Normal mood and affect. Behavior is normal.      Assessment & Plan:    See Problem List for Assessment and Plan of chronic medical problems.

## 2017-05-12 NOTE — Telephone Encounter (Signed)
ATC, NA and no VM  

## 2017-05-13 ENCOUNTER — Encounter: Payer: Self-pay | Admitting: Internal Medicine

## 2017-05-13 DIAGNOSIS — E119 Type 2 diabetes mellitus without complications: Secondary | ICD-10-CM | POA: Insufficient documentation

## 2017-05-13 LAB — PTH, INTACT AND CALCIUM
Calcium: 8.9 mg/dL (ref 8.6–10.4)
PTH: 47 pg/mL (ref 14–64)

## 2017-05-13 MED ORDER — OMEPRAZOLE 40 MG PO CPDR: 40.0000 mg | DELAYED_RELEASE_CAPSULE | Freq: Every day | ORAL | 3 refills | Status: DC

## 2017-05-13 MED ORDER — FLUCONAZOLE 150 MG PO TABS: 150.0000 mg | ORAL_TABLET | Freq: Every day | ORAL | 0 refills | Status: DC

## 2017-05-13 NOTE — Assessment & Plan Note (Signed)
Not ideally controlled and she would like a second opinion Referral to rheumatology ordered

## 2017-05-13 NOTE — Assessment & Plan Note (Signed)
Difficult to say why she is having some fatigue-possibly multifactorial Will increase sertraline to improve control of depression and anxiety Check lab work Follow-up scheduled for one month

## 2017-05-13 NOTE — Assessment & Plan Note (Signed)
Not ideally controlled Increase sertraline to 100 mg daily Continue Valium as needed

## 2017-05-13 NOTE — Assessment & Plan Note (Addendum)
Check A1c Low sugar/carb diet.

## 2017-05-13 NOTE — Assessment & Plan Note (Signed)
Did not tolerate pantoprazole Is having frequent  GERD and has had vomiting  start omeprazole 40 mg daily-if this is not controlled will need to consider a different medication May need GI referral

## 2017-05-14 ENCOUNTER — Ambulatory Visit (INDEPENDENT_AMBULATORY_CARE_PROVIDER_SITE_OTHER): Payer: Medicare Other | Admitting: Adult Health

## 2017-05-14 ENCOUNTER — Encounter: Payer: Self-pay | Admitting: Adult Health

## 2017-05-14 ENCOUNTER — Ambulatory Visit (INDEPENDENT_AMBULATORY_CARE_PROVIDER_SITE_OTHER)
Admission: RE | Admit: 2017-05-14 | Discharge: 2017-05-14 | Disposition: A | Discharge status: Home / Self Care | Payer: Medicare Other | Source: Ambulatory Visit | Attending: Adult Health | Admitting: Adult Health

## 2017-05-14 VITALS — BP 112/64 | HR 66 | Ht 64.0 in | Wt 155.2 lb

## 2017-05-14 DIAGNOSIS — J181 Lobar pneumonia, unspecified organism: Secondary | ICD-10-CM

## 2017-05-14 DIAGNOSIS — J189 Pneumonia, unspecified organism: Secondary | ICD-10-CM | POA: Diagnosis not present

## 2017-05-14 DIAGNOSIS — J438 Other emphysema: Secondary | ICD-10-CM | POA: Diagnosis not present

## 2017-05-14 NOTE — Assessment & Plan Note (Signed)
Compensated without flare   Plan  Patient Instructions  Continue on Symbicort and Spiriva .  Mucinex DM Twice daily  As needed  Cough/congestion  Follow up Dr. [place] Bells in 4-6 weeks with chest xray .  Please contact office for sooner follow up if symptoms do not improve or worsen or seek emergency care

## 2017-05-14 NOTE — Progress Notes (Signed)
$'@Patient'A$  ID: Lisa Vega, female    DOB: 01-Jan-1932, 83 y.o.   MRN: [id number]  Chief Complaint  Patient presents with  . Follow-up    Pneumonia    Referring provider: Binnie Rail, MD  HPI: 84 year old female followed for known history of COPD History of lung cancer status lobectomy in 2005 She has rheumatoid arthritis on methotrexate since 1986   TEST PFT's 2013: FEV1 1.49 (81%), ratio 60, TLC 86%, DLCO 65% (stable to better from 2007)  CXR 12/2015>stable chest   2013 echocardiogram LVEF 50-55%, normal valvular dysfunction, abnormal septal motion consistent with bundle branch block  05/14/2017 Follow up : R PNA  Pt returns for 1 week Follow-up for pneumonia. Patient was seen last visit with increased cough and congestion. She was found to have a right-sided pneumonia. Patient was treated with Levaquin for 1 week. Patient returns today saying that she is feeling better. She remains weak with low energy. Appetite is fair. She has no nausea, vomiting or diarrhea. Chest x-ray today does show improvement in the right lower lobe pneumonia. But not complete resolution. She denies any chest pain, orthopnea, PND or leg swelling  Patient has COPD and is maintained on Symbicort and Spiriva No flare cough or wheezing  Allergies  Allergen Reactions  . Aspirin   . Codeine     REACTION: hallucinations  . Demerol   . Meperidine Hcl     REACTION: severe GI upset    Immunization History  Administered Date(s) Administered  . Influenza Split 09/28/2011, 09/28/2015  . Influenza Whole 09/27/2012  . Influenza, High Dose Seasonal PF 10/28/2016, 01/12/2017  . Influenza,inj,Quad PF,36+ Mos 09/20/2013, 09/12/2014  . Pneumococcal Conjugate-13 10/24/2015  . Pneumococcal Polysaccharide-23 12/29/2007    Past Medical History:  Diagnosis Date  . Cancer ([place])    lung ca  . COPD (chronic obstructive pulmonary disease) ([place])   . Dyspnea   . GERD (gastroesophageal reflux disease)     . Hypercholesteremia   . Memory difficulties 10/28/2015  . Rheumatoid arthritis(714.0)   . Thyroid disease   . Tremor 10/28/2015   Jaw tremor  . Uterine prolapse   . Vitamin D deficiency     Tobacco History: History  Smoking Status  . Former Smoker  . Packs/day: 1.00  . Years: 20.00  . Types: Cigarettes  . Quit date: 11/10/2004  Smokeless Tobacco  . Never Used   Counseling given: Not Answered   Outpatient Encounter Prescriptions as of 05/14/2017  Medication Sig  . acetaminophen (TYLENOL) 325 MG tablet Take 650 mg by mouth every 6 (six) hours as needed.  . budesonide-formoterol (SYMBICORT) 160-4.5 MCG/ACT inhaler Needs OV for further refills.  . Cholecalciferol (VITAMIN D) 2000 units CAPS Take 2,000 Units by mouth daily.  . diazepam (VALIUM) 5 MG tablet Take 0.5 tablets (2.5 mg total) by mouth 2 (two) times daily as needed for anxiety.  . fluconazole (DIFLUCAN) 150 MG tablet Take 1 tablet (150 mg total) by mouth daily.  . folic acid (FOLVITE) 1 MG tablet Take 1 mg by mouth daily.  Marland Kitchen gabapentin (NEURONTIN) 100 MG capsule Take 100 mg by mouth 3 (three) times daily.   Marland Kitchen glucose blood (FREESTYLE LITE) test strip Use as instructed once daily to check sugars, prediabetes  . loratadine (CLARITIN) 10 MG tablet Take 10 mg by mouth daily.  . methotrexate 25 MG/ML SOLN Inject 8.08 mg into the skin once a week.   . NONFORMULARY OR COMPOUNDED ITEM Estradiol .02% 1 ML Prefilled Applicator  Sig: apply vaginally twice a week #90 Day Supply with 4 refills  . omeprazole (PRILOSEC) 40 MG capsule Take 1 capsule (40 mg total) by mouth daily.  Marland Kitchen PROAIR HFA 108 (90 Base) MCG/ACT inhaler USE 1-2 PUFFS EVERY 4-6 HOURS AS NEEDED  . ranitidine (ZANTAC) 150 MG tablet Take 1 tablet (150 mg total) by mouth at bedtime as needed for heartburn.  . sertraline (ZOLOFT) 100 MG tablet Take 1 tablet (100 mg total) by mouth daily.  Marland Kitchen SYNTHROID 88 MCG tablet TAKE 1 TABLET DAILY  . tiotropium (SPIRIVA) 18 MCG  inhalation capsule Place 1 capsule (18 mcg total) into inhaler and inhale daily.   No facility-administered encounter medications on file as of 05/14/2017.      Review of Systems  Constitutional:   No  weight loss, night sweats,  Fevers, chills,  +fatigue, or  lassitude.  HEENT:   No headaches,  Difficulty swallowing,  Tooth/dental problems, or  Sore throat,                No sneezing, itching, ear ache, nasal congestion, post nasal drip,   CV:  No chest pain,  Orthopnea, PND, swelling in lower extremities, anasarca, dizziness, palpitations, syncope.   GI  No heartburn, indigestion, abdominal pain, nausea, vomiting, diarrhea, change in bowel habits, loss of appetite, bloody stools.   Resp:    No chest wall deformity  Skin: no rash or lesions.  GU: no dysuria, change in color of urine, no urgency or frequency.  No flank pain, no hematuria   MS:  No joint pain or swelling.  No decreased range of motion.  No back pain.    Physical Exam  BP 112/64 (BP Location: Left Arm, Cuff Size: Normal)   Pulse 66   Ht '5\' 4"'$  (1.626 m)   Wt 155 lb 3.2 oz (70.4 kg)   SpO2 96%   BMI 26.64 kg/m   GEN: A/Ox3; pleasant , NAD elderly    HEENT:  [place]/AT,  EACs-clear, TMs-wnl, NOSE-clear, THROAT-clear, no lesions, no postnasal drip or exudate noted.   NECK:  Supple w/ fair ROM; no JVD; normal carotid impulses w/o bruits; no thyromegaly or nodules palpated; no lymphadenopathy.    RESP  Decreased BS in bases , no accessory muscle use, no dullness to percussion  CARD:  RRR, no m/r/g, no peripheral edema, pulses intact, no cyanosis or clubbing.  GI:   Soft & nt; nml bowel sounds; no organomegaly or masses detected.   Musco: Warm bil, no deformities or joint swelling noted.   Neuro: alert, no focal deficits noted.    Skin: Warm, no lesions or rashes    Lab Results:  CBC  BNP  Imaging: Dg Chest 2 View  Result Date: 05/14/2017 CLINICAL DATA:  Right lower lobe pneumonia. EXAM: CHEST  2  VIEW COMPARISON:  Two-view chest x-ray 05/03/2017 FINDINGS: The heart size is normal. Chronic elevation of the right hemidiaphragm is again noted. Ill-defined right lower lobe airspace disease is improving, but persists. Surgical clips are again noted at the right hilum. Pleural thickening at the right apex is stable. Mild pleural scarring is noted at the left apex is well. The left lung is otherwise clear. The visualized soft tissues and bony thorax are unremarkable. IMPRESSION: 1. Improving and posterior right lower lobe pneumonia without complete resolution. Two-view chest x-ray at 2 weeks could be used for further evaluation. If the lesion has not cleared at that point, CT may be useful. 2. Postoperative changes with volume  loss in the right lung. Electronically Signed   By: San Morelle M.D.   On: 05/14/2017 13:48   Dg Chest 2 View  Result Date: 05/03/2017 CLINICAL DATA:  Cough and congestion with shortness of breath and fever EXAM: CHEST  2 VIEW COMPARISON:  January 24, 2016 FINDINGS: There is patchy infiltrate in the lateral right base. There is postoperative change on the right with volume loss ; there is chronic blunting of the right costophrenic angle with mild chronic elevation of the right hemidiaphragm. There is scarring with apical pleural thickening in each lung apex, stable. Heart size and pulmonary vascularity are normal. No adenopathy. No bone lesions. IMPRESSION: Patchy infiltrate lateral right base, felt to represent a small focus of pneumonia. Postoperative change with volume loss on the right. Apical scarring and pleural thickening bilaterally, more on the right than on the left, stable. Left lung otherwise clear. Stable cardiac silhouette. Followup PA and lateral chest radiographs recommended in 3-4 weeks following trial of antibiotic therapy to ensure resolution and exclude underlying malignancy. These results will be called to the ordering clinician or representative by the  Radiologist Assistant, and communication documented in the PACS or zVision Dashboard. Electronically Signed   By: Lowella Grip III M.D.   On: 05/03/2017 15:11     Assessment & Plan:   COPD (chronic obstructive pulmonary disease) ([place]) Compensated without flare   Plan  Patient Instructions  Continue on Symbicort and Spiriva .  Mucinex DM Twice daily  As needed  Cough/congestion  Follow up Dr. Lake Bells in 4-6 weeks with chest xray .  Please contact office for sooner follow up if symptoms do not improve or worsen or seek emergency care      CAP (community acquired pneumonia) Right sided PNA - clinically improved.  CXR shows partial clearing  Serial cxr until gone .   Plan  . Patient Instructions  Continue on Symbicort and Spiriva .  Mucinex DM Twice daily  As needed  Cough/congestion  Follow up Dr. Lake Bells in 4-6 weeks with chest xray .  Please contact office for sooner follow up if symptoms do not improve or worsen or seek emergency care         Rexene Edison, NP 05/14/2017

## 2017-05-14 NOTE — Patient Instructions (Signed)
Continue on Symbicort and Spiriva .  Mucinex DM Twice daily  As needed  Cough/congestion  Follow up Dr. Lake Bells in 4-6 weeks with chest xray .  Please contact office for sooner follow up if symptoms do not improve or worsen or seek emergency care

## 2017-05-14 NOTE — Assessment & Plan Note (Signed)
Right sided PNA - clinically improved.  CXR shows partial clearing  Serial cxr until gone .   Plan  . Patient Instructions  Continue on Symbicort and Spiriva .  Mucinex DM Twice daily  As needed  Cough/congestion  Follow up Dr. Lake Bells in 4-6 weeks with chest xray .  Please contact office for sooner follow up if symptoms do not improve or worsen or seek emergency care

## 2017-05-17 NOTE — Progress Notes (Signed)
Reviewed, agree 

## 2017-05-20 DIAGNOSIS — G894 Chronic pain syndrome: Secondary | ICD-10-CM | POA: Diagnosis not present

## 2017-05-20 DIAGNOSIS — Z79891 Long term (current) use of opiate analgesic: Secondary | ICD-10-CM | POA: Diagnosis not present

## 2017-05-20 DIAGNOSIS — M1712 Unilateral primary osteoarthritis, left knee: Secondary | ICD-10-CM | POA: Diagnosis not present

## 2017-05-20 DIAGNOSIS — M50322 Other cervical disc degeneration at C5-C6 level: Secondary | ICD-10-CM | POA: Diagnosis not present

## 2017-06-02 DIAGNOSIS — M1712 Unilateral primary osteoarthritis, left knee: Secondary | ICD-10-CM | POA: Diagnosis not present

## 2017-06-04 DIAGNOSIS — R1310 Dysphagia, unspecified: Secondary | ICD-10-CM | POA: Diagnosis not present

## 2017-06-04 DIAGNOSIS — R300 Dysuria: Secondary | ICD-10-CM | POA: Diagnosis not present

## 2017-06-07 DIAGNOSIS — R300 Dysuria: Secondary | ICD-10-CM | POA: Diagnosis not present

## 2017-06-09 DIAGNOSIS — M5127 Other intervertebral disc displacement, lumbosacral region: Secondary | ICD-10-CM | POA: Diagnosis not present

## 2017-06-09 DIAGNOSIS — M50322 Other cervical disc degeneration at C5-C6 level: Secondary | ICD-10-CM | POA: Diagnosis not present

## 2017-06-11 ENCOUNTER — Ambulatory Visit: Payer: Medicare Other | Admitting: Pulmonary Disease

## 2017-06-17 ENCOUNTER — Ambulatory Visit (INDEPENDENT_AMBULATORY_CARE_PROVIDER_SITE_OTHER): Payer: Medicare Other | Admitting: Internal Medicine

## 2017-06-17 ENCOUNTER — Other Ambulatory Visit: Payer: Self-pay

## 2017-06-17 ENCOUNTER — Encounter: Payer: Self-pay | Admitting: Internal Medicine

## 2017-06-17 VITALS — BP 126/70 | HR 66 | Temp 98.3°F | Resp 16 | Wt 153.0 lb

## 2017-06-17 DIAGNOSIS — F418 Other specified anxiety disorders: Secondary | ICD-10-CM | POA: Diagnosis not present

## 2017-06-17 DIAGNOSIS — N952 Postmenopausal atrophic vaginitis: Secondary | ICD-10-CM

## 2017-06-17 DIAGNOSIS — E119 Type 2 diabetes mellitus without complications: Secondary | ICD-10-CM

## 2017-06-17 DIAGNOSIS — K219 Gastro-esophageal reflux disease without esophagitis: Secondary | ICD-10-CM

## 2017-06-17 MED ORDER — SERTRALINE HCL 100 MG PO TABS: 100.0000 mg | ORAL_TABLET | Freq: Every day | ORAL | 1 refills | Status: DC

## 2017-06-17 MED ORDER — NONFORMULARY OR COMPOUNDED ITEM: 0 refills | Status: DC

## 2017-06-17 MED ORDER — RANITIDINE HCL 150 MG PO TABS: 150.0000 mg | ORAL_TABLET | Freq: Every evening | ORAL | 1 refills | Status: DC | PRN

## 2017-06-17 NOTE — Progress Notes (Signed)
Subjective:    Patient ID: Lisa Vega, female    DOB: [birth year], 83 y.o.   MRN: [id number]  HPI The patient is here for follow up.  She had pneumonia about one month ago.  She still coughs, but thinks she is over it.   Depression:  She wonders if she is depressed.  She is very tired and has no motivation to do things. She retired 4 years ago and feels lost without a job, but knows she can not work now.  She has felt tired for a long time.  She finds thing she used to find enjoyable less enjoyable.  She feels less independent - she moved in with her daughter several months ago.  She is depressed she can not do things she used to.   She also feels more anxious.   Diabetes: She was just diagnosed with diabetes last month.  She does get steroid injections intermittently with her arthritis.  She is not on any medication for her diabetes.  She is compliant with a diabetic diet she thinks, but may eat too many carbs. She is not exercising regularly.     GERD:  She is taking her medication daily as prescribed.  She denies any GERD symptoms and feels her GERD is well controlled.     Medications and allergies reviewed with patient and updated if appropriate.  Patient Active Problem List   Diagnosis Date Noted  . Diabetes ([place]) 05/13/2017  . Fatigue 05/12/2017  . CAP (community acquired pneumonia) 05/03/2017  . Orthopnea 01/12/2017  . GERD (gastroesophageal reflux disease) 11/09/2016  . Age-related osteoporosis without current pathological fracture 10/14/2016  . Osteoporosis 05/06/2016  . Depression with anxiety 05/06/2016  . Hiatal hernia 05/06/2016  . Hypothyroidism 05/06/2016  . Tremor 10/28/2015  . Memory difficulties 10/28/2015  . Hoarseness 10/14/2015  . History of vitamin D deficiency 05/02/2015  . Other constipation 01/29/2015  . Abdominal pain, chronic, epigastric 11/01/2014  . 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 ([place]) 05/30/2014  . Diverticulitis of colon  without hemorrhage 08/07/2013  . Prolapse of female pelvic organs 06/22/2013  . Neck pain 03/10/2013  . Vaginal atrophy 03/09/2013  . Rectocele 03/09/2013  . Cystocele 03/09/2013  . Female pelvic pain 03/09/2013  . Urinary incontinence 03/09/2013  . COPD (chronic obstructive pulmonary disease) ([place]) 01/16/2011  . CARCINOMA, LUNG, NONSMALL CELL 12/17/2010  . Rheumatoid arthritis ([place]) 12/17/2010  . DYSPNEA 12/17/2010    Current Outpatient Prescriptions on File Prior to Visit  Medication Sig Dispense Refill  . acetaminophen (TYLENOL) 325 MG tablet Take 650 mg by mouth every 6 (six) hours as needed.    . budesonide-formoterol (SYMBICORT) 160-4.5 MCG/ACT inhaler Needs OV for further refills. 3 Inhaler 3  . Cholecalciferol (VITAMIN D) 2000 units CAPS Take 2,000 Units by mouth daily.    . diazepam (VALIUM) 5 MG tablet Take 0.5 tablets (2.5 mg total) by mouth 2 (two) times daily as needed for anxiety. 30 tablet 3  . folic acid (FOLVITE) 1 MG tablet Take 1 mg by mouth daily.    Marland Kitchen gabapentin (NEURONTIN) 100 MG capsule Take 100 mg by mouth 3 (three) times daily.     Marland Kitchen glucose blood (FREESTYLE LITE) test strip Use as instructed once daily to check sugars, prediabetes 100 each 3  . loratadine (CLARITIN) 10 MG tablet Take 10 mg by mouth daily.    . methotrexate 25 MG/ML SOLN Inject 8.08 mg into the skin once a week.     Marland Kitchen  omeprazole (PRILOSEC) 40 MG capsule Take 1 capsule (40 mg total) by mouth daily. 90 capsule 3  . PROAIR HFA 108 (90 Base) MCG/ACT inhaler USE 1-2 PUFFS EVERY 4-6 HOURS AS NEEDED 8.5 g 1  . ranitidine (ZANTAC) 150 MG tablet Take 1 tablet (150 mg total) by mouth at bedtime as needed for heartburn. 90 tablet 1  . sertraline (ZOLOFT) 100 MG tablet Take 1 tablet (100 mg total) by mouth daily. 90 tablet 1  . SYNTHROID 88 MCG tablet TAKE 1 TABLET DAILY 90 tablet 0  . tiotropium (SPIRIVA) 18 MCG inhalation capsule Place 1 capsule (18 mcg total) into inhaler and inhale daily. 90 capsule 3     No current facility-administered medications on file prior to visit.     Past Medical History:  Diagnosis Date  . Cancer ([place])    lung ca  . COPD (chronic obstructive pulmonary disease) ([place])   . Dyspnea   . GERD (gastroesophageal reflux disease)   . Hypercholesteremia   . Memory difficulties 10/28/2015  . Rheumatoid arthritis(714.0)   . Thyroid disease   . Tremor 10/28/2015   Jaw tremor  . Uterine prolapse   . Vitamin D deficiency     Past Surgical History:  Procedure Laterality Date  . APPENDECTOMY    . HEMORRHOID SURGERY    . right lobectomy  10/2004  . TONSILLECTOMY AND ADENOIDECTOMY      Social History   Social History  . Marital status: Married    Spouse name: N/A  . Number of children: 3  . Years of education: LPN   Occupational History  . 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 Smoking status: Former Smoker    Packs/day: 1.00    Years: 20.00    Types: Cigarettes    Quit date: 11/10/2004  . Smokeless tobacco: Never Used  . Alcohol use 0.0 oz/week     Comment: socially  . Drug use: No  . Sexual activity: Not Currently   Other Topics Concern  . Not on file   Social History Narrative   Patient drinks about 3 cups of coffee daily.   Patient is right handed.     Family History  Problem Relation Age of Onset  . Emphysema Mother   . Asthma Mother   . Lung cancer Father   . Pancreatic cancer Father   . Bone cancer Father   . Heart disease Brother        x1  . Heart disease Sister        x2  . Stroke Maternal Grandfather     Review of Systems  Constitutional: Positive for fatigue. Negative for chills and fever.  Respiratory: Positive for cough. Negative for shortness of breath and wheezing.   Cardiovascular: Negative for chest pain, palpitations and leg swelling.  Musculoskeletal: Positive for arthralgias and back pain.  Neurological: Negative for headaches.  Psychiatric/Behavioral: Positive  for dysphoric mood. The patient is nervous/anxious.        Objective:   Vitals:   06/17/17 1108  BP: 126/70  Pulse: 66  Resp: 16  Temp: 98.3 F (36.8 C)   Wt Readings from Last 3 Encounters:  06/17/17 153 lb (69.4 kg)  05/14/17 155 lb 3.2 oz (70.4 kg)  05/12/17 155 lb (70.3 kg)   Body mass index is 26.26 kg/m.   Physical Exam    Constitutional: Appears well-developed and well-nourished. No distress.  HENT:  Head: Normocephalic  and atraumatic.  Neck: Neck supple. No tracheal deviation present. No thyromegaly present.  No cervical lymphadenopathy Cardiovascular: Normal rate, regular rhythm and normal heart sounds.   No murmur heard. No carotid bruit .  No edema Pulmonary/Chest: Effort normal and breath sounds normal. No respiratory distress. No has no wheezes. No rales.  Skin: Skin is warm and dry. Not diaphoretic.  Psychiatric: Normal mood and affect. Behavior is normal.      Assessment & Plan:    See Problem List for Assessment and Plan of chronic medical problems.

## 2017-06-17 NOTE — Assessment & Plan Note (Signed)
Will try to avoid medication Stressed low carb/sugar diet Stay active, exercise as tolerated with arthritis Keep weight down Will recheck a1c in 3 months

## 2017-06-17 NOTE — Patient Instructions (Addendum)
  Medications reviewed and updated.  Changes include increasing zoloft to 100 mg daily.   Your prescription(s) have been submitted to your pharmacy. Please take as directed and contact our office if you believe you are having problem(s) with the medication(s).    Please followup in 3 months

## 2017-06-17 NOTE — Assessment & Plan Note (Addendum)
GERD controlled Continue daily medication  

## 2017-06-17 NOTE — Assessment & Plan Note (Addendum)
Depression and anxiety not controlled She never increased the dose to 100 mg daily - she will start that now Continue valium 2/day as needed

## 2017-06-24 DIAGNOSIS — Z79891 Long term (current) use of opiate analgesic: Secondary | ICD-10-CM | POA: Diagnosis not present

## 2017-06-24 DIAGNOSIS — M47816 Spondylosis without myelopathy or radiculopathy, lumbar region: Secondary | ICD-10-CM | POA: Diagnosis not present

## 2017-06-24 DIAGNOSIS — M50322 Other cervical disc degeneration at C5-C6 level: Secondary | ICD-10-CM | POA: Diagnosis not present

## 2017-06-24 DIAGNOSIS — M1712 Unilateral primary osteoarthritis, left knee: Secondary | ICD-10-CM | POA: Diagnosis not present

## 2017-07-07 ENCOUNTER — Ambulatory Visit: Payer: Medicare Other | Admitting: Gynecology

## 2017-07-08 ENCOUNTER — Ambulatory Visit (INDEPENDENT_AMBULATORY_CARE_PROVIDER_SITE_OTHER): Payer: Medicare Other | Admitting: Gynecology

## 2017-07-08 ENCOUNTER — Encounter: Payer: Self-pay | Admitting: Gynecology

## 2017-07-08 VITALS — BP 126/80

## 2017-07-08 DIAGNOSIS — B9689 Other specified bacterial agents as the cause of diseases classified elsewhere: Secondary | ICD-10-CM

## 2017-07-08 DIAGNOSIS — N898 Other specified noninflammatory disorders of vagina: Secondary | ICD-10-CM

## 2017-07-08 DIAGNOSIS — N760 Acute vaginitis: Secondary | ICD-10-CM

## 2017-07-08 DIAGNOSIS — Z4689 Encounter for fitting and adjustment of other specified devices: Secondary | ICD-10-CM | POA: Diagnosis not present

## 2017-07-08 LAB — WET PREP FOR TRICH, YEAST, CLUE
Trich, Wet Prep: NONE SEEN
Yeast Wet Prep HPF POC: NONE SEEN

## 2017-07-08 MED ORDER — METRONIDAZOLE 500 MG PO TABS: 500.0000 mg | ORAL_TABLET | Freq: Two times a day (BID) | ORAL | 0 refills | Status: DC

## 2017-07-08 NOTE — Progress Notes (Signed)
   Patient is an 83 year old with history of uterine prolapse along with cystocele and rectocele urinary incontinence who is not a surgical candidate due to multiple comorbidities as noted on her past medical history and has responded well will be for H incontinence ring. She is here for her 3 month maintenance and cleaning since she has debilitating rheumatoid arthritis. She is using estrogen vaginal cream twice a week. She denies any vaginal bleeding but was complaining of a vaginal discharge.  Exam: Pelvic: Bartholin urethra Skene was within normal limits Vagina: Atrophic changes, second-degree cystocele, second-degree rectocele, first-degree uterine descensus Cervix: No gross lesions   Wet prep demonstrated few clue cells moderate white blood cells and moderate bacteria  Assessment/plan: Bacterial vaginosis will be treated with Flagyl 500 mg twice a day for 7 days. After the patient was cleaning was reinserted. Patient to return back in 3 months for her annual exam.

## 2017-07-08 NOTE — Patient Instructions (Signed)
Metronidazole tablets or capsules What is this medicine? METRONIDAZOLE (me troe NI da zole) is an antiinfective. It is used to treat certain kinds of bacterial and protozoal infections. It will not work for colds, flu, or other viral infections. This medicine may be used for other purposes; ask your health care provider or pharmacist if you have questions. COMMON BRAND NAME(S): Flagyl What should I tell my health care provider before I take this medicine? They need to know if you have any of these conditions: -anemia or other blood disorders -disease of the nervous system -fungal or yeast infection -if you drink alcohol containing drinks -liver disease -seizures -an unusual or allergic reaction to metronidazole, or other medicines, foods, dyes, or preservatives -pregnant or trying to get pregnant -breast-feeding How should I use this medicine? Take this medicine by mouth with a full glass of water. Follow the directions on the prescription label. Take your medicine at regular intervals. Do not take your medicine more often than directed. Take all of your medicine as directed even if you think you are better. Do not skip doses or stop your medicine early. Talk to your pediatrician regarding the use of this medicine in children. Special care may be needed. Overdosage: If you think you have taken too much of this medicine contact a poison control center or emergency room at once. NOTE: This medicine is only for you. Do not share this medicine with others. What if I miss a dose? If you miss a dose, take it as soon as you can. If it is almost time for your next dose, take only that dose. Do not take double or extra doses. What may interact with this medicine? Do not take this medicine with any of the following medications: -alcohol or any product that contains alcohol -amprenavir oral solution -cisapride -disulfiram -dofetilide -dronedarone -paclitaxel injection -pimozide -ritonavir oral  solution -sertraline oral solution -sulfamethoxazole-trimethoprim injection -thioridazine -ziprasidone This medicine may also interact with the following medications: -birth control pills -cimetidine -lithium -other medicines that prolong the QT interval (cause an abnormal heart rhythm) -phenobarbital -phenytoin -warfarin This list may not describe all possible interactions. Give your health care provider a list of all the medicines, herbs, non-prescription drugs, or dietary supplements you use. Also tell them if you smoke, drink alcohol, or use illegal drugs. Some items may interact with your medicine. What should I watch for while using this medicine? Tell your doctor or health care professional if your symptoms do not improve or if they get worse. You may get drowsy or dizzy. Do not drive, use machinery, or do anything that needs mental alertness until you know how this medicine affects you. Do not stand or sit up quickly, especially if you are an older patient. This reduces the risk of dizzy or fainting spells. Avoid alcoholic drinks while you are taking this medicine and for three days afterward. Alcohol may make you feel dizzy, sick, or flushed. If you are being treated for a sexually transmitted disease, avoid sexual contact until you have finished your treatment. Your sexual partner may also need treatment. What side effects may I notice from receiving this medicine? Side effects that you should report to your doctor or health care professional as soon as possible: -allergic reactions like skin rash or hives, swelling of the face, lips, or tongue -confusion, clumsiness -difficulty speaking -discolored or sore mouth -dizziness -fever, infection -numbness, tingling, pain or weakness in the hands or feet -trouble passing urine or change in the amount of  urine -redness, blistering, peeling or loosening of the skin, including inside the mouth -seizures -unusually weak or  tired -vaginal irritation, dryness, or discharge Side effects that usually do not require medical attention (report to your doctor or health care professional if they continue or are bothersome): -diarrhea -headache -irritability -metallic taste -nausea -stomach pain or cramps -trouble sleeping This list may not describe all possible side effects. Call your doctor for medical advice about side effects. You may report side effects to FDA at 1-800-FDA-1088. Where should I keep my medicine? Keep out of the reach of children. Store at room temperature below 25 degrees C (77 degrees F). Protect from light. Keep container tightly closed. Throw away any unused medicine after the expiration date. NOTE: This sheet is a summary. It may not cover all possible information. If you have questions about this medicine, talk to your doctor, pharmacist, or health care provider.  2018 Elsevier/Gold Standard (2013-07-21 14:08:39) Bacterial Vaginosis Bacterial vaginosis is a vaginal infection that occurs when the normal balance of bacteria in the vagina is disrupted. It results from an overgrowth of certain bacteria. This is the most common vaginal infection among women ages 51-44. Because bacterial vaginosis increases your risk for STIs (sexually transmitted infections), getting treated can help reduce your risk for chlamydia, gonorrhea, herpes, and HIV (human immunodeficiency virus). Treatment is also important for preventing complications in pregnant women, because this condition can cause an early (premature) delivery. What are the causes? This condition is caused by an increase in harmful bacteria that are normally present in small amounts in the vagina. However, the reason that the condition develops is not fully understood. What increases the risk? The following factors may make you more likely to develop this condition:  Having a new sexual partner or multiple sexual partners.  Having unprotected  sex.  Douching.  Having an intrauterine device (IUD).  Smoking.  Drug and alcohol abuse.  Taking certain antibiotic medicines.  Being pregnant.  You cannot get bacterial vaginosis from toilet seats, bedding, swimming pools, or contact with objects around you. What are the signs or symptoms? Symptoms of this condition include:  Grey or white vaginal discharge. The discharge can also be watery or foamy.  A fish-like odor with discharge, especially after sexual intercourse or during menstruation.  Itching in and around the vagina.  Burning or pain with urination.  Some women with bacterial vaginosis have no signs or symptoms. How is this diagnosed? This condition is diagnosed based on:  Your medical history.  A physical exam of the vagina.  Testing a sample of vaginal fluid under a microscope to look for a large amount of bad bacteria or abnormal cells. Your health care provider may use a cotton swab or a small wooden spatula to collect the sample.  How is this treated? This condition is treated with antibiotics. These may be given as a pill, a vaginal cream, or a medicine that is put into the vagina (suppository). If the condition comes back after treatment, a second round of antibiotics may be needed. Follow these instructions at home: Medicines  Take over-the-counter and prescription medicines only as told by your health care provider.  Take or use your antibiotic as told by your health care provider. Do not stop taking or using the antibiotic even if you start to feel better. General instructions  If you have a female sexual partner, tell her that you have a vaginal infection. She should see her health care provider and be treated if  she has symptoms. If you have a female sexual partner, he does not need treatment.  During treatment: ? Avoid sexual activity until you finish treatment. ? Do not douche. ? Avoid alcohol as directed by your health care provider. ? Avoid  breastfeeding as directed by your health care provider.  Drink enough water and fluids to keep your urine clear or pale yellow.  Keep the area around your vagina and rectum clean. ? Wash the area daily with warm water. ? Wipe yourself from front to back after using the toilet.  Keep all follow-up visits as told by your health care provider. This is important. How is this prevented?  Do not douche.  Wash the outside of your vagina with warm water only.  Use protection when having sex. This includes latex condoms and dental dams.  Limit how many sexual partners you have. To help prevent bacterial vaginosis, it is best to have sex with just one partner (monogamous).  Make sure you and your sexual partner are tested for STIs.  Wear cotton or cotton-lined underwear.  Avoid wearing tight pants and pantyhose, especially during summer.  Limit the amount of alcohol that you drink.  Do not use any products that contain nicotine or tobacco, such as cigarettes and e-cigarettes. If you need help quitting, ask your health care provider.  Do not use illegal drugs. Where to find more information:  Centers for Disease Control and Prevention: AppraiserFraud.fi  American Sexual Health Association (ASHA): www.ashastd.org  U.S. Department of Health and Financial controller, Office on Women's Health: DustingSprays.pl or SecuritiesCard.it Contact a health care provider if:  Your symptoms do not improve, even after treatment.  You have more discharge or pain when urinating.  You have a fever.  You have pain in your abdomen.  You have pain during sex.  You have vaginal bleeding between periods. Summary  Bacterial vaginosis is a vaginal infection that occurs when the normal balance of bacteria in the vagina is disrupted.  Because bacterial vaginosis increases your risk for STIs (sexually transmitted infections), getting treated can help reduce your  risk for chlamydia, gonorrhea, herpes, and HIV (human immunodeficiency virus). Treatment is also important for preventing complications in pregnant women, because the condition can cause an early (premature) delivery.  This condition is treated with antibiotic medicines. These may be given as a pill, a vaginal cream, or a medicine that is put into the vagina (suppository). This information is not intended to replace advice given to you by your health care provider. Make sure you discuss any questions you have with your health care provider. Document Released: 12/14/2005 Document Revised: 08/29/2016 Document Reviewed: 08/29/2016 Elsevier Interactive Patient Education  2017 [employer].

## 2017-07-27 ENCOUNTER — Encounter: Payer: Self-pay | Admitting: Pulmonary Disease

## 2017-07-27 ENCOUNTER — Ambulatory Visit (INDEPENDENT_AMBULATORY_CARE_PROVIDER_SITE_OTHER): Payer: Medicare Other | Admitting: Pulmonary Disease

## 2017-07-27 ENCOUNTER — Ambulatory Visit (INDEPENDENT_AMBULATORY_CARE_PROVIDER_SITE_OTHER)
Admission: RE | Admit: 2017-07-27 | Discharge: 2017-07-27 | Disposition: A | Discharge status: Home / Self Care | Payer: Medicare Other | Source: Ambulatory Visit | Attending: Pulmonary Disease | Admitting: Pulmonary Disease

## 2017-07-27 VITALS — BP 142/68 | HR 68 | Temp 97.7°F | Ht 64.0 in | Wt 153.0 lb

## 2017-07-27 DIAGNOSIS — J181 Lobar pneumonia, unspecified organism: Secondary | ICD-10-CM

## 2017-07-27 DIAGNOSIS — J441 Chronic obstructive pulmonary disease with (acute) exacerbation: Secondary | ICD-10-CM | POA: Diagnosis not present

## 2017-07-27 DIAGNOSIS — J189 Pneumonia, unspecified organism: Secondary | ICD-10-CM

## 2017-07-27 DIAGNOSIS — R05 Cough: Secondary | ICD-10-CM | POA: Diagnosis not present

## 2017-07-27 DIAGNOSIS — R1310 Dysphagia, unspecified: Secondary | ICD-10-CM

## 2017-07-27 DIAGNOSIS — R0602 Shortness of breath: Secondary | ICD-10-CM | POA: Diagnosis not present

## 2017-07-27 MED ORDER — FLUCONAZOLE 100 MG PO TABS: 100.0000 mg | ORAL_TABLET | Freq: Every day | ORAL | 0 refills | Status: DC

## 2017-07-27 MED ORDER — BUDESONIDE-FORMOTEROL FUMARATE 160-4.5 MCG/ACT IN AERO: INHALATION_SPRAY | RESPIRATORY_TRACT | 3 refills | Status: DC

## 2017-07-27 MED ORDER — TIOTROPIUM BROMIDE MONOHYDRATE 18 MCG IN CAPS: 18.0000 ug | ORAL_CAPSULE | Freq: Every day | RESPIRATORY_TRACT | 3 refills | Status: DC

## 2017-07-27 NOTE — Addendum Note (Signed)
Addended by: Len Blalock on: 07/27/2017 03:38 PM   Modules accepted: Orders

## 2017-07-27 NOTE — Progress Notes (Signed)
Subjective:    Patient ID: Lisa Vega, female    DOB: 01/20/1932, 83 y.o.   MRN: [id number]  Synopsis: Former patient of Dr. Gwenette Greet with COPD in addition history of lung cancer status post right lower lobectomy in 2005.  Also has rheumatoid arthritis and has been on methotrexate since 1986.   HPI Chief Complaint  Patient presents with  . Follow-up    pt c/o increased sob, fatigue, mostly nonprod cough- s/s worse X1 week.      [name] had pnumonia back in May and she says that she was feeling pretty bad for a while.  She said she was sick for a week before she came in then she was noted to have pneumonia.  She says that for the last few weeks she has been feeling night sweats, chills.  This is associated with increasing dyspnea for the last 1.5 weeks.  She has blamed this on the weather and her medicines.  She has noticed some chest tightness, some wheezing.  She notes soreness on the right side which is new. Its worse when she lies on the right.  She has lost 5-6 pounds in the last 5-6 months.  She coughs up some mucus after using her inhalers. She has some sinus congestion which goes along with it.    She notes that she has trouble choking on food on and off for quite sometime.  She has been seeing Dr. Watt Climes because she has been throwing up regularly in the evenings after heavy meals.  He told her to take priolosec which helped some.  She notes that she will get food stuck in her chest, just under her neck.  Magod considered doing an endoscopy.   She is requesting a prescription for fluconazole for a yeast infection.    Past Medical History:  Diagnosis Date  . Cancer ([place])    lung ca  . COPD (chronic obstructive pulmonary disease) ([place])   . Dyspnea   . GERD (gastroesophageal reflux disease)   . Hypercholesteremia   . Memory difficulties 10/28/2015  . Rheumatoid arthritis(714.0)   . Thyroid disease   . Tremor 10/28/2015   Jaw tremor  . Uterine prolapse   . Vitamin D deficiency        Review of Systems  Constitutional: Negative for chills, fatigue and fever.  HENT: Negative for postnasal drip, rhinorrhea and sinus pressure.   Respiratory: Positive for shortness of breath. Negative for cough and wheezing.   Cardiovascular: Negative for chest pain, palpitations and leg swelling.       Objective:   Physical Exam Vitals:   07/27/17 1505  BP: (!) 142/68  Pulse: 68  Temp: 97.7 F (36.5 C)  TempSrc: Oral  SpO2: 97%  Weight: 153 lb (69.4 kg)  Height: 5\' 4"  (1.626 m)  O2 saturation rechecked, actually 98% on RA, O2 saturation initially documented incorrectly  RA  Gen: well appearing HENT: OP clear, TM's clear, neck supple PULM: CTA B, normal percussion CV: RRR, no mgr, trace edema GI: BS+, soft, nontender Derm: no cyanosis or rash Psyche: normal mood and affect    Records from her visit with my partner reviewed where she was treated for a COPD exacerbation.  PFT's 2013:  FEV1 1.49 (81%), ratio 60, TLC 86%, DLCO 65% (stable to better from 2007)  CXR 12/2011:  No acute process.  2013 echocardiogram LVEF 50-55%, normal valvular dysfunction, abnormal septal motion consistent with bundle branch block  Records from her last visit he reviewed  her she was following up for right lower lobe pneumonia and she had partial clearance on her chest x-ray.  Images from her last chest x-ray independently reviewed showing partial clearing of a right lower airspace disease, status post right lower lobectomy     Assessment & Plan:  Community acquired pneumonia of right lower lobe of lung ([place]) - Plan: DG Chest 2 View  Dysphagia, unspecified type - Plan: DG Esophagus  COPD with acute exacerbation ([place])   Discussion: She has a past medical history significant for COPD and has had recurrent episodes of pneumonia in the lower part of her right upper lobe (status post right lower lobectomy. She notes a new problem of dysphagia and frequent vomiting. She says that  food will get stuck in her chest. Her gastroenterologist is contemplated performing an endoscopy. I'm concerned based on the distribution of the pneumonia on her x-ray about the possibility of aspiration pneumonia. However, she seems to have clinical resolution of the problem. Given her history of lung cancer we need to get a chest x-ray today to make sure that she has not had any persistent radiographic abnormalities.  Plan: For your pneumonia: We will get a chest x-ray today to make sure it has cleared up  For your trouble swallowing/dysphagia: We will get a barium swallow in call you with the results  For your COPD: Continue taking Symbicort and Spiriva as you are doing Get a flu shot in the fall  We will see you back in 6 months or sooner if needed  Current Outpatient Prescriptions:  .  acetaminophen (TYLENOL) 325 MG tablet, Take 650 mg by mouth every 6 (six) hours as needed., Disp: , Rfl:  .  budesonide-formoterol (SYMBICORT) 160-4.5 MCG/ACT inhaler, Needs OV for further refills., Disp: 3 Inhaler, Rfl: 3 .  Cholecalciferol (VITAMIN D) 2000 units CAPS, Take 2,000 Units by mouth daily., Disp: , Rfl:  .  diazepam (VALIUM) 5 MG tablet, Take 0.5 tablets (2.5 mg total) by mouth 2 (two) times daily as needed for anxiety., Disp: 30 tablet, Rfl: 3 .  folic acid (FOLVITE) 1 MG tablet, Take 1 mg by mouth daily., Disp: , Rfl:  .  gabapentin (NEURONTIN) 100 MG capsule, Take 100 mg by mouth 3 (three) times daily. , Disp: , Rfl:  .  glucose blood (FREESTYLE LITE) test strip, Use as instructed once daily to check sugars, prediabetes, Disp: 100 each, Rfl: 3 .  loratadine (CLARITIN) 10 MG tablet, Take 10 mg by mouth daily., Disp: , Rfl:  .  methotrexate 25 MG/ML SOLN, Inject 8.08 mg into the skin once a week. , Disp: , Rfl:  .  NONFORMULARY OR COMPOUNDED ITEM, Estradiol .02% 1 ML Prefilled Applicator Sig: apply vaginally twice a week #90 Day Supply with 4 refills, Disp: 1 each, Rfl: 0 .  omeprazole  (PRILOSEC) 40 MG capsule, Take 1 capsule (40 mg total) by mouth daily., Disp: 90 capsule, Rfl: 3 .  PROAIR HFA 108 (90 Base) MCG/ACT inhaler, USE 1-2 PUFFS EVERY 4-6 HOURS AS NEEDED, Disp: 8.5 g, Rfl: 1 .  ranitidine (ZANTAC) 150 MG tablet, Take 1 tablet (150 mg total) by mouth at bedtime as needed for heartburn., Disp: 90 tablet, Rfl: 1 .  sertraline (ZOLOFT) 100 MG tablet, Take 1 tablet (100 mg total) by mouth daily., Disp: 90 tablet, Rfl: 1 .  SYNTHROID 88 MCG tablet, TAKE 1 TABLET DAILY, Disp: 90 tablet, Rfl: 0 .  tiotropium (SPIRIVA) 18 MCG inhalation capsule, Place 1 capsule (18 mcg  total) into inhaler and inhale daily., Disp: 90 capsule, Rfl: 3 .  metroNIDAZOLE (FLAGYL) 500 MG tablet, Take 1 tablet (500 mg total) by mouth 2 (two) times daily. (Patient not taking: Reported on 07/27/2017), Disp: 14 tablet, Rfl: 0

## 2017-07-27 NOTE — Patient Instructions (Addendum)
For your pneumonia: We will get a chest x-ray today to make sure it has cleared up  For your trouble swallowing/dysphagia: We will get a barium swallow in call you with the results  For your COPD: Continue taking Symbicort and Spiriva as you are doing Get a flu shot in the fall  We will see you back in 6 months or sooner if needed

## 2017-07-28 ENCOUNTER — Other Ambulatory Visit: Payer: Self-pay

## 2017-07-28 ENCOUNTER — Telehealth: Payer: Self-pay | Admitting: Pulmonary Disease

## 2017-07-28 MED ORDER — DOXYCYCLINE HYCLATE 100 MG PO TABS: 100.0000 mg | ORAL_TABLET | Freq: Two times a day (BID) | ORAL | 0 refills | Status: DC

## 2017-07-28 NOTE — Telephone Encounter (Signed)
Notes recorded by Juanito Doom, MD on 07/28/2017 at 9:08 AM EDT Caryl Pina, Please let her know that her chest x-ray showed pneumonia once again. I would like for her to take doxycycline 100 mg twice a day for 7 days with a probiotic like yogurt. I need her to have a follow-up visit with a nurse practitioner in 3-4 weeks with a repeat chest x-ray. Thanks, Tonny [place] with pt's daughter and notified of results per Dr. Lake Bells. She verbalized understanding and denied any questions.

## 2017-07-29 ENCOUNTER — Other Ambulatory Visit: Payer: Self-pay

## 2017-07-29 MED ORDER — BUDESONIDE-FORMOTEROL FUMARATE 160-4.5 MCG/ACT IN AERO: INHALATION_SPRAY | RESPIRATORY_TRACT | 3 refills | Status: DC

## 2017-08-04 ENCOUNTER — Telehealth: Payer: Self-pay

## 2017-08-04 NOTE — Telephone Encounter (Signed)
Patient called in today while our computer system was down- finished doxycycline given at last OV yesterday- c/o unchanged sob, low grade temp between 99-101, mostly nonprod cough, chills.  Pt is requesting further recs.  Spoke on the phone with BQ, who suggested that because patient is not responding to abx therapy for pna that she needs to be seen in office, UC, or ED.  Spoke with pt, offered appt tomorrow at [facility] but pt is unable to drive to this location.  Pt is scheduled to see MW on Friday morning.  Urged pt to seek UC/ED if her s/s worsen between now and OV.  Pt expressed understanding.    Nothing further needed.

## 2017-08-06 ENCOUNTER — Other Ambulatory Visit (INDEPENDENT_AMBULATORY_CARE_PROVIDER_SITE_OTHER): Payer: Medicare Other

## 2017-08-06 ENCOUNTER — Ambulatory Visit (INDEPENDENT_AMBULATORY_CARE_PROVIDER_SITE_OTHER): Payer: Medicare Other | Admitting: Internal Medicine

## 2017-08-06 ENCOUNTER — Ambulatory Visit (INDEPENDENT_AMBULATORY_CARE_PROVIDER_SITE_OTHER)
Admission: RE | Admit: 2017-08-06 | Discharge: 2017-08-06 | Disposition: A | Discharge status: Home / Self Care | Payer: Medicare Other | Source: Ambulatory Visit | Attending: Internal Medicine | Admitting: Internal Medicine

## 2017-08-06 ENCOUNTER — Encounter: Payer: Self-pay | Admitting: Internal Medicine

## 2017-08-06 ENCOUNTER — Other Ambulatory Visit: Payer: Self-pay | Admitting: Internal Medicine

## 2017-08-06 VITALS — BP 128/68 | HR 73 | Temp 98.8°F | Ht 64.0 in | Wt 151.8 lb

## 2017-08-06 DIAGNOSIS — R300 Dysuria: Secondary | ICD-10-CM | POA: Diagnosis not present

## 2017-08-06 DIAGNOSIS — J449 Chronic obstructive pulmonary disease, unspecified: Secondary | ICD-10-CM | POA: Diagnosis not present

## 2017-08-06 DIAGNOSIS — J189 Pneumonia, unspecified organism: Secondary | ICD-10-CM

## 2017-08-06 DIAGNOSIS — J181 Lobar pneumonia, unspecified organism: Secondary | ICD-10-CM | POA: Diagnosis not present

## 2017-08-06 DIAGNOSIS — R0600 Dyspnea, unspecified: Secondary | ICD-10-CM

## 2017-08-06 DIAGNOSIS — R0609 Other forms of dyspnea: Secondary | ICD-10-CM

## 2017-08-06 DIAGNOSIS — M069 Rheumatoid arthritis, unspecified: Secondary | ICD-10-CM

## 2017-08-06 DIAGNOSIS — R05 Cough: Secondary | ICD-10-CM | POA: Diagnosis not present

## 2017-08-06 LAB — URINALYSIS, ROUTINE W REFLEX MICROSCOPIC
Bilirubin Urine: NEGATIVE
Ketones, ur: NEGATIVE
Nitrite: NEGATIVE
Specific Gravity, Urine: 1.01 (ref 1.000–1.030)
Total Protein, Urine: NEGATIVE
Urine Glucose: NEGATIVE
Urobilinogen, UA: 0.2 (ref 0.0–1.0)
pH: 7 (ref 5.0–8.0)

## 2017-08-06 LAB — CBC WITH DIFFERENTIAL/PLATELET
Basophils Absolute: 0.1 10*3/uL (ref 0.0–0.1)
Basophils Relative: 1.2 % (ref 0.0–3.0)
Eosinophils Absolute: 0.4 10*3/uL (ref 0.0–0.7)
Eosinophils Relative: 4.7 % (ref 0.0–5.0)
HCT: 38.5 % (ref 36.0–46.0)
Hemoglobin: 12.8 g/dL (ref 12.0–15.0)
Lymphocytes Relative: 27 % (ref 12.0–46.0)
Lymphs Abs: 2.2 10*3/uL (ref 0.7–4.0)
MCHC: 33.2 g/dL (ref 30.0–36.0)
MCV: 96.2 fl (ref 78.0–100.0)
Monocytes Absolute: 1.1 10*3/uL — ABNORMAL HIGH (ref 0.1–1.0)
Monocytes Relative: 13.5 % — ABNORMAL HIGH (ref 3.0–12.0)
Neutro Abs: 4.3 10*3/uL (ref 1.4–7.7)
Neutrophils Relative %: 53.6 % (ref 43.0–77.0)
Platelets: 289 10*3/uL (ref 150.0–400.0)
RBC: 4.01 Mil/uL (ref 3.87–5.11)
RDW: 15.5 % (ref 11.5–15.5)
WBC: 8.1 10*3/uL (ref 4.0–10.5)

## 2017-08-06 LAB — BASIC METABOLIC PANEL
BUN: 9 mg/dL (ref 6–23)
CO2: 34 mEq/L — ABNORMAL HIGH (ref 19–32)
Calcium: 9.2 mg/dL (ref 8.4–10.5)
Chloride: 98 mEq/L (ref 96–112)
Creatinine, Ser: 0.87 mg/dL (ref 0.40–1.20)
GFR: 66.11 mL/min (ref >60.00)
Glucose, Bld: 156 mg/dL — ABNORMAL HIGH (ref 70–99)
Potassium: 3.3 mEq/L — ABNORMAL LOW (ref 3.5–5.1)
Sodium: 137 mEq/L (ref 135–145)

## 2017-08-06 LAB — SEDIMENTATION RATE: Sed Rate: 51 mm/hr — ABNORMAL HIGH (ref 0–30)

## 2017-08-06 LAB — BRAIN NATRIURETIC PEPTIDE: Pro B Natriuretic peptide (BNP): 163 pg/mL — ABNORMAL HIGH (ref 0.0–100.0)

## 2017-08-06 MED ORDER — CIPROFLOXACIN HCL 250 MG PO TABS: 250.0000 mg | ORAL_TABLET | Freq: Two times a day (BID) | ORAL | 0 refills | Status: DC

## 2017-08-06 MED ORDER — PREDNISONE 10 MG PO TABS: ORAL_TABLET | ORAL | 0 refills | Status: DC

## 2017-08-06 NOTE — Progress Notes (Signed)
Spoke with pt and notified of results per Dr. Wert. Pt verbalized understanding and denied any questions. 

## 2017-08-06 NOTE — Patient Instructions (Addendum)
Please remember to go to the lab and x-ray department downstairs in the basement  for your tests - we will call you with the results when they are available.  Prednisone 10 mg take  4 each am x 2 days,   2 each am x 2 days,  1 each am x 2 days and stop   Stop spiriva  Continue symbicort Take 2 puffs first thing in am and then another 2 puffs about 12 hours later.   Only use your albuterol as a rescue medication to be used if you can't catch your breath by resting or doing a relaxed purse lip breathing pattern.  - The less you use it, the better it will work when you need it. - Ok to use up to 2 puffs  every 4 hours if you must but call for immediate appointment if use goes up over your usual need - Don't leave home without it !!  (think of it like the spare tire for your car)    Continue  Try prilosec 40  Take 30-60 min before first meal of the day and  Zantac one  After supper    GERD (REFLUX)  is an extremely common cause of respiratory symptoms just like yours , many times with no obvious heartburn at all.    It can be treated with medication, but also with lifestyle changes including elevation of the head of your bed (ideally with 6 inch  bed blocks),  Smoking cessation, avoidance of late meals, excessive alcohol, and avoid fatty foods, chocolate, peppermint, colas, red wine, and acidic juices such as orange juice.  NO MINT OR MENTHOL PRODUCTS SO NO COUGH DROPS   USE SUGARLESS CANDY INSTEAD (Jolley ranchers or Stover's or Life Savers) or even ice chips will also do - the key is to swallow to prevent all throat clearing. NO OIL BASED VITAMINS - use powdered substitutes.    Keep previous appt with [place]   with all active medications /inhalers/ solutions in hand so we can verify exactly what you are taking. This includes all medications from all doctors and over the counters

## 2017-08-06 NOTE — Progress Notes (Signed)
Subjective:     Patient ID: Lisa Vega, female   DOB: [birth year],    MRN: [id number]  HPI  62 yowf  Quit smoking with RA since 2005 s/p RULobectomy last chemo 2006    08/06/2017 1st   office visit/ Lisa Vega   Chief Complaint  Patient presents with  . Acute Visit    Pt states her breathing is not improving since the last visit, and she is running a low grade temp consistantly. She is coughing with very minimal yellow sputum.  She has been using proair 2 x daily.  abruptly ill last week in April 2018 onset sore throat , cough, congestion , headache , fatigue, lightheaded. Pain with coughing and deep breaths. Coughing up thick green mucus. Appetite is down . No nv/v. No hemoptyiss . No chest pain .  Took some left over keflex at home for last 10 d. Did not help that much . Has taken some otc cold meds with little help.  RA ok control  CXR  05/03/17 showed  RLL infiltrate /  rec Begin Levaquin 500mg  daily for 7 days , take with food  Mucinex DM Twice daily  As needed  Cough/congestion  Follow up in 1 week with chest xray and As needed     05/14/17 f/u ov NP improved in every way  rec Continue on Symbicort and Spiriva .  Mucinex DM Twice daily  As needed  Cough/congestion  Follow up Dr. Lake Bells in 4-6 weeks with chest xray .   07/27/17 mcQuaid She says that for the last few weeks she has been feeling night sweats, chills.  This is associated with increasing dyspnea for the last 1.5 weeks.  She has blamed this on the weather and her medicines.  She has noticed some chest tightness, some wheezing.  She notes soreness on the right side which is new. Its worse when she lies on the right.  She has lost 5-6 pounds in the last 5-6 months.  She coughs up some mucus after using her inhalers. She has some sinus congestion which goes along with it.    She notes that she has trouble choking on food on and off for quite sometime.  She has been seeing Dr. Watt Climes because she has been throwing up regularly in  the evenings after heavy meals.  He told her to take priolosec which helped some.  She notes that she will get food stuck in her chest, just under her neck.  Magod considered doing an endoscopy.  rec For your pneumonia: We will get a chest x-ray today to make sure it has cleared up> rx doxy  For your trouble swallowing/dysphagia: We will get a barium swallow in call you with the results Continue taking Symbicort and Spiriva as you are doing Get a flu shot in the fall   08/08/2017 acute extended ov/Lisa Vega re:  Sob/cough  Chief Complaint  Patient presents with  . Acute Visit    Pt states her breathing is not improving since the last visit, and she is running a low grade temp consistantly. She is coughing with very minimal yellow sputum.  She has been using proair 2 x daily.   New problem is N and V x 2 weeks suddenly  resolved 8/5 p finished doxy and not able to recall whether this flared after started doxy "I'm on so much med can't keep them straight" but has had chronic dysphagia  RA worse x 2 months  Comfortable at rest and supine  until 3 am sits up and typically takes proaire almost nightly x  June 2018  Doe = [facility] = can't walk 100 yards even at a slow pace at a flat grade s stopping due to sob     No obvious patterns in day to day or daytime variability or assoc   mucus plugs or hemoptysis or cp or chest tightness, subjective wheeze or overt sinus or hb symptoms. No unusual exp hx or h/o childhood pna/ asthma or knowledge of premature birth.  Sleeping ok without nocturnal  or early am exacerbation  of respiratory  c/o's or need for noct saba. Also denies any obvious fluctuation of symptoms with weather or environmental changes or other aggravating or alleviating factors except as outlined above   Current Medications, Allergies, Complete Past Medical History, Past Surgical History, Family History, and Social History were reviewed in [employer] record.  ROS  The  following are not active complaints unless bolded sore throat, dysphagia, dental problems, itching, sneezing,  nasal congestion or excess/ purulent secretions, ear ache,   fever, chills, sweats, unintended wt loss, classically pleuritic or exertional cp,  orthopnea pnd or leg swelling, presyncope, palpitations, abdominal pain, anorexia, nausea, vomiting, diarrhea  or change in bowel or bladder habits, change in stools or urine, dysuria,hematuria,  rash, arthralgias, visual complaints, headache, numbness, weakness or ataxia or problems with walking or coordination,  change in mood/affect or memory.           Review of Systems     Objective:   Physical Exam    amb wf who failed to answer a single question asked in a straightforward manner, tending to go off on tangents or answer questions with ambiguous medical terms or diagnoses and seemed perplexed  when asked the same question more than once for clarification.    Wt Readings from Last 3 Encounters:  08/06/17 151 lb 12.8 oz (68.9 kg)  07/27/17 153 lb (69.4 kg)  06/17/17 153 lb (69.4 kg)    Vital signs reviewed - Note on arrival 02 sats  98% on RA     HEENT: nl dentition, turbinates bilaterally, and oropharynx. Nl external ear canals without cough reflex   NECK :  without JVD/Nodes/TM/ nl carotid upstrokes bilaterally   LUNGS: no acc muscle use,  Nl contour chest with very minimal insp /exp rhonchi s bronchial changes or cough on insp/exp   CV:  RRR  no s3 or murmur or increase in P2, and no edema   ABD:  soft and nontender with nl inspiratory excursion in the supine position. No bruits or organomegaly appreciated, bowel sounds nl  MS:  Nl gait/ ext warm without deformities, calf tenderness, cyanosis or clubbing Severe RA changes MCP's bilaterally with ulnar deviation   SKIN: warm and dry without lesions    NEURO:  alert, approp, nl sensorium with  no motor or cerebellar deficits apparent.    CXR PA and Lateral:    08/06/2017 :    I personally reviewed images and agree with radiology impression as follows:    Persistent but slightly improved right upper lobe airspace Opacity. My impression:  Marked improvement in as dz RUL   Labs ordered/ reviewed:      Chemistry      Component Value Date/Time   NA 137 08/06/2017 1016   NA 139 08/10/2011 1457   K 3.3 (L) 08/06/2017 1016   K 4.4 08/10/2011 1457   CL 98 08/06/2017 1016   CL 98 08/10/2011  1457   CO2 34 (H) 08/06/2017 1016   CO2 29 08/10/2011 1457   BUN 9 08/06/2017 1016   BUN 15 08/10/2011 1457   CREATININE 0.87 08/06/2017 1016   CREATININE 0.9 08/10/2011 1457      Component Value Date/Time   CALCIUM 9.2 08/06/2017 1016   CALCIUM 8.9 08/10/2011 1457   ALKPHOS 75 05/12/2017 1606   ALKPHOS 67 08/10/2011 1457   AST 13 05/12/2017 1606   AST 26 08/10/2011 1457   ALT 8 05/12/2017 1606   ALT 21 08/10/2011 1457   BILITOT 0.5 05/12/2017 1606   BILITOT 0.70 08/10/2011 1457        Lab Results  Component Value Date   WBC 8.1 08/06/2017   HGB 12.8 08/06/2017   HCT 38.5 08/06/2017   MCV 96.2 08/06/2017   PLT 289.0 08/06/2017        Lab Results  Component Value Date   TSH 1.40 05/12/2017     Lab Results  Component Value Date   PROBNP 163.0 (H) 08/06/2017       Lab Results  Component Value Date   ESRSEDRATE 51 (H) 08/06/2017          Assessment:

## 2017-08-08 NOTE — Assessment & Plan Note (Signed)
Poor control on mtx but no evidence of ILD from RA at this point (no obvious mtx toxicity either) > referred back to rheum

## 2017-08-08 NOTE — Assessment & Plan Note (Signed)
Already appropriately treated, f/u planned

## 2017-08-08 NOTE — Assessment & Plan Note (Signed)
U/a pos nitrites/ pyuria > rx  cipro 500 one half bid x 5 days

## 2017-08-08 NOTE — Assessment & Plan Note (Addendum)
Right upper lobectomy 2005 for [facility] cancer. PFT's 2013:  FEV1 1.49 (81%), ratio 60, TLC 86%, DLCO 65% (stable to better from 2007) Pulmonary rehab referral 08/2012 >> never heard from them Throat soreness with spiriva Pulmonary rehab referral 02/2014 Tried on spiriva respimat >> likes handihaler better.   Symptoms are markedly disproportionate to objective findings and not clear this is actually all a  lung problem but pt does appear to have difficult to sort out respiratory symptoms of unknown origin for which  DDX  = almost all start with A and  include Adherence, Ace Inhibitors, Acid Reflux, Active Sinus Disease, Alpha 1 Antitripsin deficiency, Anxiety masquerading as Airways dz,  ABPA,  Allergy(esp in young), Aspiration (esp in elderly), Adverse effects of meds,  Active smokers, A bunch of PE's (a small clot burden can't cause this syndrome unless there is already severe underlying pulm or vascular dz with poor reserve) plus two Bs  = Bronchiectasis and Beta blocker use..and one C= CHF    Adherence is always the initial "prime suspect" and is a multilayered concern that requires a "trust but verify" approach in every patient - starting with knowing how to use medications, especially inhalers, correctly, keeping up with refills and understanding the fundamental difference between maintenance and prns vs those medications only taken for a very short course and then stopped and not refilled.  - - The proper method of use, as well as anticipated side effects, of a metered-dose inhaler are discussed and demonstrated to the patient. Improved effectiveness after extensive coaching during this visit to a level of approximately 75 % from a baseline of 50 %   ? Acid (or non-acid) GERD > always difficult to exclude as up to 75% of pts in some series report no assoc GI/ Heartburn symptoms> rec continue max (24h)  acid suppression and diet restrictions/ reviewed      ? Recurrent asp > w/u in progress   ?  Allergy/ asthma  > Prednisone 10 mg take  4 each am x 2 days,   2 each am x 2 days,  1 each am x 2 days and stop   ? Anxiety > usually at the bottom of this list of usual suspects but should be much higher on this pt's based on H and P and note already on psychotropics   ? Adverse effects of meds :  Doxy probably contributed to N and V but moot issue now/ no evidence MTX  Toxicity to date.  DPI can cause upper airway symptoms to flare but for some reason (? Related to poor hfa /smi technique) she prefers Spiriva dpi but I'm not convinced she really needs this > try off for now restart if worse  ? Bronchiectasis from recurrent asp ? Needs hrct f/u   ? CHF > bnp is slt elevated but nothing to report    rec no more abx for now, keep f/u with Dr Lake Bells NP  As planned    I had an extended discussion with the patient reviewing all relevant studies completed to date and  lasting 25 minutes of a 40  minute acute visit with pt new to me    re  severe non-specific but potentially very serious refractory respiratory symptoms of uncertain and potentially multiple  etiologies.   Each maintenance medication was reviewed in detail including most importantly the difference between maintenance and prns and under what circumstances the prns are to be triggered using an action plan format that is not  reflected in the computer generated alphabetically organized AVS.    Please see AVS for specific instructions unique to this office visit that I personally wrote and verbalized to the the pt in detail and then reviewed with pt  by my nurse highlighting any changes in therapy/plan of care  recommended at today's visit.

## 2017-08-08 NOTE — Assessment & Plan Note (Signed)
See copd ddx

## 2017-08-11 ENCOUNTER — Ambulatory Visit (HOSPITAL_COMMUNITY): Payer: Medicare Other

## 2017-08-18 ENCOUNTER — Ambulatory Visit (INDEPENDENT_AMBULATORY_CARE_PROVIDER_SITE_OTHER): Payer: Medicare Other | Admitting: Pulmonary Disease

## 2017-08-18 ENCOUNTER — Other Ambulatory Visit (HOSPITAL_COMMUNITY): Payer: Self-pay | Admitting: Pulmonary Disease

## 2017-08-18 ENCOUNTER — Encounter: Payer: Self-pay | Admitting: Pulmonary Disease

## 2017-08-18 VITALS — BP 120/62 | HR 68 | Ht 64.25 in | Wt 150.6 lb

## 2017-08-18 DIAGNOSIS — R1310 Dysphagia, unspecified: Secondary | ICD-10-CM

## 2017-08-18 DIAGNOSIS — J189 Pneumonia, unspecified organism: Secondary | ICD-10-CM

## 2017-08-18 DIAGNOSIS — R0600 Dyspnea, unspecified: Secondary | ICD-10-CM

## 2017-08-18 DIAGNOSIS — R0609 Other forms of dyspnea: Secondary | ICD-10-CM

## 2017-08-18 DIAGNOSIS — J181 Lobar pneumonia, unspecified organism: Secondary | ICD-10-CM

## 2017-08-18 NOTE — Progress Notes (Signed)
Chart and office note reviewed in detail  > agree with a/p as outlined    

## 2017-08-18 NOTE — Progress Notes (Signed)
[facility]   Chief Complaint  Patient presents with  . Follow-up    Pt is here for a return visit from her PNA. Pt states that she still does not feel any better since she saw Dr. Melvyn Novas on 08/06/17.     Primary Pulmonologist: Dr. Melvyn Novas   Current Outpatient Prescriptions on File Prior to Visit  Medication Sig  . acetaminophen (TYLENOL) 325 MG tablet Take 650 mg by mouth every 6 (six) hours as needed.  . budesonide-formoterol (SYMBICORT) 160-4.5 MCG/ACT inhaler 2 puffs BID  . Cholecalciferol (VITAMIN D) 2000 units CAPS Take 2,000 Units by mouth daily.  . diazepam (VALIUM) 5 MG tablet Take 0.5 tablets (2.5 mg total) by mouth 2 (two) times daily as needed for anxiety.  . folic acid (FOLVITE) 1 MG tablet Take 1 mg by mouth daily.  Marland Kitchen glucose blood (FREESTYLE LITE) test strip Use as instructed once daily to check sugars, prediabetes  . loratadine (CLARITIN) 10 MG tablet Take 10 mg by mouth daily.  . methotrexate 25 MG/ML SOLN Inject 8.08 mg into the skin once a week.   . NONFORMULARY OR COMPOUNDED ITEM Estradiol .02% 1 ML Prefilled Applicator Sig: apply vaginally twice a week #90 Day Supply with 4 refills  . omeprazole (PRILOSEC) 40 MG capsule Take 1 capsule (40 mg total) by mouth daily.  Marland Kitchen PROAIR HFA 108 (90 Base) MCG/ACT inhaler USE 1-2 PUFFS EVERY 4-6 HOURS AS NEEDED  . ranitidine (ZANTAC) 150 MG tablet Take 1 tablet (150 mg total) by mouth at bedtime as needed for heartburn.  . sertraline (ZOLOFT) 100 MG tablet Take 1 tablet (100 mg total) by mouth daily.  Marland Kitchen SYNTHROID 88 MCG tablet TAKE 1 TABLET DAILY   No current facility-administered medications on file prior to visit.     Studies: 2013 PFT's >>  FEV1 1.49 (81%), ratio 60, TLC 86%, DLCO 65% (stable to better from 2007) 8/10  CBC >> WBC 8.1, Hgb 12.8, platelets 289 8/10  BMP >> Na 137, K 3.3, CL 98, CO2 34, glucose 156, BUN 9, sr cr 0.87 8/10  BNP >> 163 8/10  CXR >> images personally reviewed, faint RUL opacity, elevated R  hemi-diaphragm  Past Medical Hx:  has a past medical history of Cancer ([place]); COPD (chronic obstructive pulmonary disease) ([place]); Dyspnea; GERD (gastroesophageal reflux disease); Hypercholesteremia; Memory difficulties (10/28/2015); Rheumatoid arthritis(714.0); Thyroid disease; Tremor (10/28/2015); Uterine prolapse; and Vitamin D deficiency.   Past Surgical hx, Allergies, Family hx, Social hx all reviewed.  Vital Signs BP 120/62 (BP Location: Left Arm, Patient Position: Sitting, Cuff Size: Normal)   Pulse 68   Ht 5' 4.25" (1.632 m)   Wt 150 lb 9.6 oz (68.3 kg)   SpO2 98%   BMI 25.65 kg/m   History of Present Illness Lisa Vega is a 83 y.o. female, former smoker (quit 2005), with a history of RA (on methotrexate), COPD, RUL lobectomy for [place] lung cancer (dx 2005, last chemo 2006) who presented to the pulmonary office for follow up on recent PNA.    The patient was seen on 8/10 by Dr. Melvyn Novas for recurrent PNA.  She was treated with Cipro and prednisone (completed).  She reports feeling ongoing fatigue, decreased energy levels & "just doesn't want to do anything".  The patient has been seen at least 3 times for pneumonia since May.  She reports she has difficulty swallowing foods & food frequently gets hung up in the back of her throat. On 08/14/17 she ate dinner with  her family and had collard greens. After dinner she went to brush her dentures, when she opened her mouth greens came back up.  She subsequently vomited up her meal.  Occasionally, she feels as if she has to force her to throw up and she feels food is stuck. She is followed by Dr. Daisey Must for prior history of ulcers, hiatal hernia, GERD and has been on Prilosec since 1993. She reports she has periods where she feels she is "choking to death".    In regards to pneumonia, she reports she initially had fevers and chills. She feels a temperature for her is 98/99. When she initially was sick she was frequently checking her temperature  but she states she got tired of taking it and hasn't checked it recently. She continues to endorse fatigue, shortness of breath with exertion and night sweats. She states she has had these symptoms off and on since May 2018.  Currently the patient denies cough with sputum production, no fevers, chest pain, pain with inspiration.  Additionally, she reports she recently saw her home and moved in with her daughter. She is struggling with living with her family. She states she has a fantastic family but is too independent to live with her family. She continues to drive and provides all self-care.  Physical Exam  General - well developed adult F in no acute distress ENT - No sinus tenderness, no oral exudate, no LAN Cardiac - s1s2 regular, no murmur Chest - even/non-labored, lungs bilaterally clear. No wheeze/rales Back - No focal tenderness Abd - Soft, non-tender Ext - No edema Neuro - Normal strength Skin - No rashes Psych - normal mood, and behavior   Assessment/Plan  Discussion:  83 y/o female, former smoker, with a past medical history of RA on methotrexate, COPD, [facility] lung cancer (2005) and recent right upper lobe pneumonia. She has had 3 episodes of pneumonia since May 2018. She endorses significant history of swallowing difficulties. She also has a hiatal hernia and elevated right hemidiaphragm on recent chest x-ray. Prior episode of pneumonia in May was left lower lobe. Differential diagnosis includes a high suspicion for recurrent aspiration, subsequent bronchiectasis from aspiration, GERD with hiatal hernia, potential methotrexate side effect (opportunistic infection vs fibrosis- doubtful).    Recent RUL PNA Rule Out Aspiration  GERD    Plan: Arrange for CT of the chest to assess parenchyma/airways Assess swallowing evaluation with SLP Pending results of swallowing evaluation she may need upper endoscopy to rule out stricture 8/10 labs reviewed with the patient in detail She is  plan to see Dr. Trudie Reed on 8/23. Have asked the patient to review her fatigue with Dr. Trudie Reed as may be related to RA. Continue Symbicort Continue Prilosec Once above data obtained I will call the patient and discuss findings  Hypokalemia - mild Plan: Follow-up with your PCP regarding your potassium level. Encouraged 3 balanced meals per day and adequate fluid intake  Patient Instructions  1.  We will set you up to assess your swallowing with Speech Therapy  2.  A CT of the Chest has been ordered to assess your lungs given recurrent pneumonia (concerned this may be recurrent aspiration) 3.  Your labs from 8/10 are stable with the exception of a slightly low potassium.  Eat a balanced diet and this should correct. You can follow up with your Primary MD regarding your potassium level if needed.  4.  Follow up with Dr. Trudie Reed as planned  5.  I will call you  once the results of above CT and swallow evaluation are available.  6.  Follow up with Dr. Melvyn Novas in 3 months 7.  Call if new or worsening symptoms.     [name], NP-C [facility]  [phone] 08/18/2017, 2:00 PM

## 2017-08-18 NOTE — Patient Instructions (Addendum)
1.  We will set you up to assess your swallowing with Speech Therapy  2.  A CT of the Chest has been ordered to assess your lungs given recurrent pneumonia (concerned this may be recurrent aspiration) 3.  Your labs from 8/10 are stable with the exception of a slightly low potassium.  Eat a balanced diet and this should correct. You can follow up with your Primary MD regarding your potassium level if needed.  4.  Follow up with Dr. Trudie Reed as planned  5.  I will call you once the results of above CT and swallow evaluation are available.  6.  Follow up with Dr. Melvyn Novas in 3 months 7.  Call if new or worsening symptoms.

## 2017-08-19 ENCOUNTER — Ambulatory Visit (INDEPENDENT_AMBULATORY_CARE_PROVIDER_SITE_OTHER)
Admission: RE | Admit: 2017-08-19 | Discharge: 2017-08-19 | Disposition: A | Discharge status: Home / Self Care | Payer: Medicare Other | Source: Ambulatory Visit | Attending: Pulmonary Disease | Admitting: Pulmonary Disease

## 2017-08-19 DIAGNOSIS — M0579 Rheumatoid arthritis with rheumatoid factor of multiple sites without organ or systems involvement: Secondary | ICD-10-CM | POA: Diagnosis not present

## 2017-08-19 DIAGNOSIS — J181 Lobar pneumonia, unspecified organism: Secondary | ICD-10-CM | POA: Diagnosis not present

## 2017-08-19 DIAGNOSIS — Z6825 Body mass index (BMI) 25.0-25.9, adult: Secondary | ICD-10-CM | POA: Diagnosis not present

## 2017-08-19 DIAGNOSIS — E663 Overweight: Secondary | ICD-10-CM | POA: Diagnosis not present

## 2017-08-19 DIAGNOSIS — Z79899 Other long term (current) drug therapy: Secondary | ICD-10-CM | POA: Diagnosis not present

## 2017-08-19 DIAGNOSIS — J189 Pneumonia, unspecified organism: Secondary | ICD-10-CM

## 2017-08-19 DIAGNOSIS — R5382 Chronic fatigue, unspecified: Secondary | ICD-10-CM | POA: Diagnosis not present

## 2017-08-19 DIAGNOSIS — R0602 Shortness of breath: Secondary | ICD-10-CM | POA: Diagnosis not present

## 2017-08-19 DIAGNOSIS — M2550 Pain in unspecified joint: Secondary | ICD-10-CM | POA: Diagnosis not present

## 2017-08-19 DIAGNOSIS — R918 Other nonspecific abnormal finding of lung field: Secondary | ICD-10-CM | POA: Diagnosis not present

## 2017-08-24 ENCOUNTER — Ambulatory Visit (HOSPITAL_COMMUNITY)
Admission: RE | Admit: 2017-08-24 | Discharge: 2017-08-24 | Disposition: A | Discharge status: Home / Self Care | Payer: Medicare Other | Source: Ambulatory Visit | Attending: Pulmonary Disease | Admitting: Pulmonary Disease

## 2017-08-24 ENCOUNTER — Telehealth: Payer: Self-pay | Admitting: Pulmonary Disease

## 2017-08-24 DIAGNOSIS — R1310 Dysphagia, unspecified: Secondary | ICD-10-CM | POA: Diagnosis not present

## 2017-08-24 DIAGNOSIS — J181 Lobar pneumonia, unspecified organism: Secondary | ICD-10-CM

## 2017-08-24 DIAGNOSIS — C3491 Malignant neoplasm of unspecified part of right bronchus or lung: Secondary | ICD-10-CM | POA: Diagnosis not present

## 2017-08-24 DIAGNOSIS — M069 Rheumatoid arthritis, unspecified: Secondary | ICD-10-CM | POA: Diagnosis not present

## 2017-08-24 DIAGNOSIS — K219 Gastro-esophageal reflux disease without esophagitis: Secondary | ICD-10-CM | POA: Diagnosis not present

## 2017-08-24 DIAGNOSIS — E559 Vitamin D deficiency, unspecified: Secondary | ICD-10-CM | POA: Diagnosis not present

## 2017-08-24 DIAGNOSIS — K449 Diaphragmatic hernia without obstruction or gangrene: Secondary | ICD-10-CM

## 2017-08-24 DIAGNOSIS — E079 Disorder of thyroid, unspecified: Secondary | ICD-10-CM | POA: Insufficient documentation

## 2017-08-24 DIAGNOSIS — J449 Chronic obstructive pulmonary disease, unspecified: Secondary | ICD-10-CM | POA: Diagnosis not present

## 2017-08-24 DIAGNOSIS — E7800 Pure hypercholesterolemia, unspecified: Secondary | ICD-10-CM | POA: Diagnosis not present

## 2017-08-24 DIAGNOSIS — F458 Other somatoform disorders: Secondary | ICD-10-CM | POA: Insufficient documentation

## 2017-08-24 DIAGNOSIS — J189 Pneumonia, unspecified organism: Secondary | ICD-10-CM

## 2017-08-24 NOTE — Telephone Encounter (Signed)
Non-contrast chest CT can be considered in 12 months if patient is high-risk. This recommendation follows the consensus statement: Guidelines for Management of Incidental Pulmonary Nodules Detected on CT Images: From the Fleischner Society  She has h/o lung ca so is high risk but with such small nodules and h/o RA much more likely these will be benign but I would follow the guidelines for high risk and close this loop in one year

## 2017-08-24 NOTE — Progress Notes (Signed)
Modified Barium Swallow Progress Note  Patient Details  Name: TASHAWNA THOM MRN: [id number] Date of Birth: 1932/12/18  Today's Date: 08/24/2017  Modified Barium Swallow completed.  Full report located under Chart Review in the Imaging Section.  Brief recommendations include the following:  Clinical Impression  Pt exhibits normal oropharyngeal swallow function. Trace-mnimal vallecular and pyriform residue which did not pose aspiration risk. Timely swallow initiation with adequate laryngeal protection without penetration or aspiration during study. Prominent cricopharyngeal muscle. Barium pill transited pharynx into esophagus (unable to fully view descent). Pt reported globus sensation with after pill in the pharyngeal area. Recommend continue regular texture, thin liquids. Reviewed reflux precautions due to GER history.     Swallow Evaluation Recommendations       SLP Diet Recommendations: Regular solids;Thin liquid   Liquid Administration via: Straw;Cup   Medication Administration: Whole meds with liquid   Supervision: Patient able to self feed   Compensations: Follow solids with liquid   Postural Changes: Remain semi-upright after after feeds/meals (Comment);Seated upright at 90 degrees   Oral Care Recommendations: Oral care BID        Houston Siren 08/24/2017,2:37 PM   Orbie Pyo [place].[employer] [phone]

## 2017-08-24 NOTE — Telephone Encounter (Signed)
Called Lisa Vega and discussed the results of her CT scan and her modified barium swallow.  Reviewed that CT shows bronchiectasis (which may be related to persistent aspiration) and areas of possible scarring.  She has multiple <87mm pulmonary nodules.  MBS does not demonstrate any overt aspiration.  Concern with her history she is experiencing reflux from her hiatal hernia.    Plan: Referral for an appointment back to see Dr. Daisey Must to discuss conservative measures (if any) for hernia.   Will ask Dr. Melvyn Novas to review her CT images given hx of lung cancer and current nodules.   Encouraged small frequent meals, caution with eating / swallowing.    [name], NP-C [facility] Pgr: [phone] or if no answer [phone] 08/24/2017, 3:23 PM

## 2017-08-25 NOTE — Telephone Encounter (Addendum)
Called Lisa Vega to update on plan for pulmonary nodule follow up.  No new actions at this time.  Will need follow up CT in one year.    She does need to be set up with Dr. Daisey Must from GI to determine if there are any conservative measures can be done for her hiatal hernia > suspect this is contributing to aspiration.  MBS negative   [name], NP-C [facility] Pgr: [phone] or if no answer [phone] 08/25/2017, 3:26 PM

## 2017-09-02 ENCOUNTER — Telehealth: Payer: Self-pay | Admitting: *Deleted

## 2017-09-02 NOTE — Telephone Encounter (Signed)
Pt called c/o lower back discomfort, lower left side pressure, low grade temp, last checked it was 99.9 this am, burning and itching with urination, no odor having normal bowel movements. Patient said she took some "pain"medication for lower back and doesn't want to drive to be seen today. Please advise

## 2017-09-02 NOTE — Telephone Encounter (Signed)
Many possibilities as to the source of her complaints. Range from urinary, respiratory to GI. My recommendation would be for her to be seen and evaluated. I do not think a gynecologist office is a place to start given her age and medical issues. I would recommend being evaluated by her primary physician or in an urgent care.

## 2017-09-02 NOTE — Telephone Encounter (Signed)
Pt informed the below, will follow with PCP or urgent care.

## 2017-09-03 ENCOUNTER — Other Ambulatory Visit (INDEPENDENT_AMBULATORY_CARE_PROVIDER_SITE_OTHER): Payer: Medicare Other

## 2017-09-03 ENCOUNTER — Encounter: Payer: Self-pay | Admitting: Family Medicine

## 2017-09-03 ENCOUNTER — Telehealth: Payer: Self-pay | Admitting: Internal Medicine

## 2017-09-03 ENCOUNTER — Ambulatory Visit (INDEPENDENT_AMBULATORY_CARE_PROVIDER_SITE_OTHER): Payer: Medicare Other | Admitting: Family Medicine

## 2017-09-03 VITALS — BP 114/54 | HR 74 | Temp 98.0°F | Ht 64.25 in | Wt 149.0 lb

## 2017-09-03 DIAGNOSIS — K5732 Diverticulitis of large intestine without perforation or abscess without bleeding: Secondary | ICD-10-CM

## 2017-09-03 DIAGNOSIS — R300 Dysuria: Secondary | ICD-10-CM

## 2017-09-03 LAB — URINALYSIS, ROUTINE W REFLEX MICROSCOPIC
Nitrite: NEGATIVE
Specific Gravity, Urine: 1.02 (ref 1.000–1.030)
Urine Glucose: NEGATIVE
Urobilinogen, UA: 1 (ref 0.0–1.0)
pH: 6 (ref 5.0–8.0)

## 2017-09-03 LAB — CBC WITH DIFFERENTIAL/PLATELET
Basophils Absolute: 0.1 10*3/uL (ref 0.0–0.1)
Basophils Relative: 0.8 % (ref 0.0–3.0)
Eosinophils Absolute: 0.2 10*3/uL (ref 0.0–0.7)
Eosinophils Relative: 2.2 % (ref 0.0–5.0)
HCT: 37.9 % (ref 36.0–46.0)
Hemoglobin: 12.7 g/dL (ref 12.0–15.0)
Lymphocytes Relative: 21 % (ref 12.0–46.0)
Lymphs Abs: 1.5 10*3/uL (ref 0.7–4.0)
MCHC: 33.6 g/dL (ref 30.0–36.0)
MCV: 96.7 fl (ref 78.0–100.0)
Monocytes Absolute: 0.8 10*3/uL (ref 0.1–1.0)
Monocytes Relative: 11.3 % (ref 3.0–12.0)
Neutro Abs: 4.6 10*3/uL (ref 1.4–7.7)
Neutrophils Relative %: 64.7 % (ref 43.0–77.0)
Platelets: 234 10*3/uL (ref 150.0–400.0)
RBC: 3.92 Mil/uL (ref 3.87–5.11)
RDW: 15.9 % — ABNORMAL HIGH (ref 11.5–15.5)
WBC: 7.2 10*3/uL (ref 4.0–10.5)

## 2017-09-03 LAB — C-REACTIVE PROTEIN: CRP: 7 mg/dL (ref 0.5–20.0)

## 2017-09-03 MED ORDER — AMOXICILLIN-POT CLAVULANATE 875-125 MG PO TABS: 1.0000 | ORAL_TABLET | Freq: Three times a day (TID) | ORAL | 0 refills | Status: DC

## 2017-09-03 NOTE — Assessment & Plan Note (Signed)
This seems to be an acute flare. Having pain with urination as well.  - augmetin  - advised to follow up if not improving then consider CT ab/pelvis  - CBC and CRP - given indications to have immediate care

## 2017-09-03 NOTE — Telephone Encounter (Signed)
Patient is calling to follow up on this. Please advise.

## 2017-09-03 NOTE — Telephone Encounter (Signed)
Spoke with pharmacy and will change augmentin to cipro and flagyl for 7 days.   Rosemarie Ax, MD [facility] 09/03/2017, 3:40 PM

## 2017-09-03 NOTE — Addendum Note (Signed)
Addended by: Jannette Spanner on: 09/03/2017 11:51 AM   Modules accepted: Orders

## 2017-09-03 NOTE — Telephone Encounter (Signed)
Called with drug interaction.  States [facility] sent in Augmentin and it interacts with Methotrexate.  Please follow up with pharmacy in regard.

## 2017-09-03 NOTE — Telephone Encounter (Signed)
I placed referral for GI/Dr. Maygod.

## 2017-09-03 NOTE — Progress Notes (Signed)
Lisa Vega - 83 y.o. female MRN [id number]  Date of birth: 12-Sep-1932  SUBJECTIVE:  Including CC & ROS.  No chief complaint on file.   Lisa Vega is a 83 yo F that is presenting with right lower abdominal quadrant pain, dysuria, fatigue and weakness. She's had recurrent PNA this summer as well. She does have a history of COPD as well. Her current symptoms started about a week ago. She's had a low grade temperature for 4 days. She is accompanied by her daughter who is providing part of the history. Has a history of diverticulitis and has been taking Flagyl. She's had a history of appendectomy and bilateral tubal ligation. She deals with constipation is regular basis is not had any diarrhea. She feels that the pain is worse if she has to take a deep breath. She does have some urinary complaints with burning with urination. She is unsure if she has a yeast infection or not.   Review of her blood work from 08/06/17 shows a normal kidney function. Review of her CT abdomen pelvis from 10/26/12 shows diverticulitis.  Review of Systems  Constitutional: Positive for fever.  Eyes: Negative for pain.  Respiratory: Negative for shortness of breath.   Cardiovascular: Negative for chest pain and leg swelling.  Gastrointestinal: Positive for abdominal pain and constipation. Negative for abdominal distention.  Endocrine: Negative for polyuria.  Genitourinary: Positive for dysuria.  Musculoskeletal: Negative for gait problem.  Skin: Negative for color change.  Neurological: Positive for weakness. Negative for numbness.  Hematological: Negative for adenopathy.  otherwise negative   HISTORY: Past Medical, Surgical, Social, and Family History Reviewed & Updated per EMR.   Pertinent Historical Findings include:  Past Medical History:  Diagnosis Date  . Cancer ([place])    lung ca  . COPD (chronic obstructive pulmonary disease) ([place])   . Dyspnea   . GERD (gastroesophageal reflux disease)   .  Hypercholesteremia   . Memory difficulties 10/28/2015  . Rheumatoid arthritis(714.0)   . Thyroid disease   . Tremor 10/28/2015   Jaw tremor  . Uterine prolapse   . Vitamin D deficiency     Past Surgical History:  Procedure Laterality Date  . APPENDECTOMY    . HEMORRHOID SURGERY    . right lobectomy  10/2004  . TONSILLECTOMY AND ADENOIDECTOMY      Allergies  Allergen Reactions  . Aspirin   . Codeine     REACTION: hallucinations  . Demerol   . Meperidine Hcl     REACTION: severe GI upset    Family History  Problem Relation Age of Onset  . Emphysema Mother   . Asthma Mother   . Lung cancer Father   . Pancreatic cancer Father   . Bone cancer Father   . Heart disease Brother        x1  . Heart disease Sister        x2  . Stroke Maternal Grandfather      Social History   Social History  . Marital status: Married    Spouse name: N/A  . Number of children: 3  . Years of education: LPN   Occupational History  . 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 Smoking status: Former Smoker    Packs/day: 1.00    Years: 20.00    Types: Cigarettes    Quit date: 11/10/2004  . Smokeless tobacco: Never Used  . Alcohol use 0.0 oz/week  Comment: socially  . Drug use: No  . Sexual activity: Not Currently   Other Topics Concern  . Not on file   Social History Narrative   Patient drinks about 3 cups of coffee daily.   Patient is right handed.      PHYSICAL EXAM:  VS: BP (!) 114/54 (BP Location: Left Arm, Patient Position: Sitting, Cuff Size: Normal)   Pulse 74   Temp 98 F (36.7 C) (Oral)   Ht 5' 4.25" (1.632 m)   Wt 149 lb (67.6 kg)   SpO2 93%   BMI 25.38 kg/m  Physical Exam Gen: NAD, alert, cooperative with exam, well-appearing ENT: normal lips, normal nasal mucosa,  Eye: normal EOM, normal conjunctiva and lids CV:  no edema, +2 pedal pulses, regular rate and rhythm   Resp: no accessory muscle use,  non-labored, clear to auscultation bilaterally GI: no masses, no hernia, tenderness to palpation left lower quadrant, normal bowel sounds, soft Skin: no rashes, no areas of induration  Neuro: normal tone, normal sensation to touch Psych:  normal insight, alert and oriented MSK: Normal gait, normal strength      ASSESSMENT & PLAN:   I spent 40 minutes with this patient, greater than 50% was face-to-face time counseling regarding the below diagnosis.   Diverticulitis of colon without hemorrhage This seems to be an acute flare. Having pain with urination as well.  - augmetin  - advised to follow up if not improving then consider CT ab/pelvis  - CBC and CRP - given indications to have immediate care   Dysuria Reports as to having some symptoms c/w an infection vs a yeast infection.  - UA and urine culture  - if negative can try diflucan

## 2017-09-03 NOTE — Patient Instructions (Signed)
Thank you for coming in,   We will call you with the blood work from today. Please go to the ER if you don't have any improvement. Please follow up next week if you only have slight improvement.    Please feel free to call with any questions or concerns at any time, at [phone]. --Dr. Raeford Razor

## 2017-09-03 NOTE — Assessment & Plan Note (Signed)
Reports as to having some symptoms c/w an infection vs a yeast infection.  - UA and urine culture  - if negative can try diflucan

## 2017-09-04 ENCOUNTER — Other Ambulatory Visit: Payer: Self-pay | Admitting: Internal Medicine

## 2017-09-06 ENCOUNTER — Telehealth: Payer: Self-pay | Admitting: Internal Medicine

## 2017-09-06 NOTE — Telephone Encounter (Signed)
Pt called in to tell you that she is taking the antibiotic that you gave her.  She is 3 days in to the 7 day course and she still has a low grade fever, side a tiny bit better but breathing is still bad.  She would like to know what the urine showed.  She said that she was suppose to call in today with her progress   Best number [place]

## 2017-09-07 NOTE — Telephone Encounter (Signed)
Attempted to call patient but unable to get through and unable to leave a voicemail. Her urinalysis was normal. Her lab work did not suggest that she has a severe infection. She continues to trouble breathing with may need to try some steroids.  Rosemarie Ax, MD [facility] 09/07/2017, 9:06 AM

## 2017-09-08 NOTE — Telephone Encounter (Signed)
Pt informed of results.

## 2017-09-09 MED ORDER — FLUCONAZOLE 150 MG PO TABS: 150.0000 mg | ORAL_TABLET | Freq: Once | ORAL | 0 refills | Status: AC

## 2017-09-09 NOTE — Telephone Encounter (Signed)
Pt called said that she has now gotten a yeast infection from the meds and would like something called in for her.    Pharmacy -[place]

## 2017-09-10 ENCOUNTER — Other Ambulatory Visit: Payer: Self-pay | Admitting: Family Medicine

## 2017-09-10 MED ORDER — FLUCONAZOLE 150 MG PO TABS: 150.0000 mg | ORAL_TABLET | Freq: Once | ORAL | 0 refills | Status: AC

## 2017-09-11 ENCOUNTER — Telehealth: Payer: Self-pay | Admitting: Pulmonary Disease

## 2017-09-11 MED ORDER — PREDNISONE 20 MG PO TABS: 20.0000 mg | ORAL_TABLET | Freq: Every day | ORAL | 0 refills | Status: AC

## 2017-09-11 NOTE — Telephone Encounter (Signed)
Kimi called me because she says that she has been having a flare of her COPD.  Specifically she has tightness in her chest, some shortness of breath.  She is compliant with her symbicort and spiriva.  No fever or chills, minimal cough and wheeze.  She feels this is consistent with her COPD flaring up. Rx prednisone 20mg  daily x5 days, call if no improvement

## 2017-09-16 ENCOUNTER — Encounter: Payer: Self-pay | Admitting: Neurology

## 2017-09-16 ENCOUNTER — Ambulatory Visit (INDEPENDENT_AMBULATORY_CARE_PROVIDER_SITE_OTHER): Payer: Medicare Other | Admitting: Neurology

## 2017-09-16 VITALS — BP 137/80 | HR 67 | Ht 64.25 in | Wt 147.5 lb

## 2017-09-16 DIAGNOSIS — R251 Tremor, unspecified: Secondary | ICD-10-CM

## 2017-09-16 MED ORDER — GABAPENTIN 100 MG PO CAPS: ORAL_CAPSULE | ORAL | 3 refills | Status: DC

## 2017-09-16 NOTE — Progress Notes (Signed)
Reason for visit: Tremor  ICESS BERTONI is an 83 y.o. female  History of present illness:  Ms. 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 The patient has not been seen in over a year and a half, she had been treated with diazepam previously but could not tolerate the medication and was switched to low-dose gabapentin which she has found to be effective. She has more recently run out of the medication and comes in today for an evaluation. She is underlying stress as her granddaughter has passed away recently, and stress will increase her tremor. The patient has had some problems with swallowing, she is followed by Dr. Watt Climes for this. She has significant problems with rheumatoid arthritis followed by Dr. Trudie Reed. The patient reports some slight memory problems, but this is not significant. She is otherwise fully independent, she operates a motor vehicle without difficulty. The patient returns to the office today for an evaluation.  Past Medical History:  Diagnosis Date  . Cancer ([place])    lung ca  . COPD (chronic obstructive pulmonary disease) ([place])   . Dyspnea   . GERD (gastroesophageal reflux disease)   . Hypercholesteremia   . Memory difficulties 10/28/2015  . Rheumatoid arthritis(714.0)   . Thyroid disease   . Tremor 10/28/2015   Jaw tremor  . Uterine prolapse   . Vitamin D deficiency     Past Surgical History:  Procedure Laterality Date  . APPENDECTOMY    . HEMORRHOID SURGERY    . right lobectomy  10/2004  . TONSILLECTOMY AND ADENOIDECTOMY      Family History  Problem Relation Age of Onset  . Emphysema Mother   . Asthma Mother   . Lung cancer Father   . Pancreatic cancer Father   . Bone cancer Father   . Heart disease Brother        x1  . Heart disease Sister        x2  . Stroke Maternal Grandfather     Social history:  reports that she quit smoking about 12 years ago. Her smoking use included  Cigarettes. She has a 20.00 pack-year smoking history. She has never used smokeless tobacco. She reports that she drinks alcohol. She reports that she does not use drugs.    Allergies  Allergen Reactions  . Aspirin   . Codeine     REACTION: hallucinations  . Demerol   . Meperidine Hcl     REACTION: severe GI upset    Medications:  Prior to Admission medications   Medication Sig Start Date End Date Taking? Authorizing Provider  acetaminophen (TYLENOL) 325 MG tablet Take 650 mg by mouth every 6 (six) hours as needed.   Yes [provider]  budesonide-formoterol (SYMBICORT) 160-4.5 MCG/ACT inhaler 2 puffs BID 07/29/17  Yes Juanito Doom, MD  Cholecalciferol (VITAMIN D) 2000 units CAPS Take 2,000 Units by mouth daily.   Yes [provider]  diazepam (VALIUM) 5 MG tablet Take 0.5 tablets (2.5 mg total) by mouth 2 (two) times daily as needed for anxiety. 11/09/16  Yes Burns, [name], MD  folic acid (FOLVITE) 1 MG tablet Take 1 mg by mouth daily.   Yes [provider]  glucose blood (FREESTYLE LITE) test strip Use as instructed once daily to check sugars, prediabetes 11/09/16  Yes Burns, [name], MD  loratadine (CLARITIN) 10 MG tablet Take 10 mg by mouth daily.   Yes [provider]  methotrexate 25 MG/ML SOLN Inject 8.08 mg into the skin once a week.    Yes [provider]  NONFORMULARY OR COMPOUNDED ITEM Estradiol .02% 1 ML Prefilled Applicator Sig: apply vaginally twice a week #90 Day Supply with 4 refills 06/17/17  Yes Terrance Mass, MD  omeprazole (PRILOSEC) 40 MG capsule Take 1 capsule (40 mg total) by mouth daily. 05/13/17  Yes Burns, [name], MD  predniSONE (DELTASONE) 20 MG tablet Take 1 tablet (20 mg total) by mouth daily with breakfast. 09/11/17 09/16/17 Yes Juanito Doom, MD  PROAIR HFA 108 [phone] Base) MCG/ACT inhaler USE 1-2 PUFFS EVERY 4-6 HOURS AS NEEDED 10/05/16  Yes Burns, [name], MD  ranitidine (ZANTAC) 150 MG tablet Take 1  tablet (150 mg total) by mouth at bedtime as needed for heartburn. 06/17/17  Yes Burns, [name], MD  sertraline (ZOLOFT) 100 MG tablet Take 1 tablet (100 mg total) by mouth daily. 06/17/17  Yes Binnie Rail, MD  SYNTHROID 88 MCG tablet TAKE 1 TABLET DAILY 03/08/17  Yes Binnie Rail, MD  SYNTHROID 88 MCG tablet TAKE 1 TABLET DAILY 09/06/17  Yes Binnie Rail, MD    ROS:  Out of a complete 14 system review of symptoms, the patient complains only of the following symptoms, and all other reviewed systems are negative.  Decreased appetite, fatigue, weight loss, excessive sweating Difficulty swallowing Shortness of breath Excessive thirst Constipation, vomiting Joint pain, rheumatoid arthritis, back pain, neck pain, neck stiffness Memory disturbance, weakness, tremors Depression, anxiety  Blood pressure 137/80, pulse 67, height 5' 4.25" (1.632 m), weight 147 lb 8 oz (66.9 kg).  Physical Exam  General: The patient is alert and cooperative at the time of the examination.  Neuromuscular: Significant deformities at the MP joints of the hands is seen bilaterally.  Skin: No significant peripheral edema is noted.   Neurologic Exam  Mental status: The patient is alert and oriented x 3 at the time of the examination. The patient has apparent normal recent and remote memory, with an apparently normal attention span and concentration ability.   Cranial nerves: Facial symmetry is present. Speech is normal, no aphasia or dysarthria is noted. Extraocular movements are full. Visual fields are full. A mild jaw tremor is seen. No evidence of a vocal tremor is noted.  Motor: The patient has good strength in all 4 extremities.   Sensory examination: Soft touch sensation is symmetric on the face, arms, and legs.  Coordination: The patient has good finger-nose-finger and heel-to-shin bilaterally.  Gait and station: The patient has a slightly wide-based, limping gait. Tandem gait was not attempted.  Romberg is negative, but is unsteady. No drift is seen.  Reflexes: Deep tendon reflexes are symmetric.   Assessment/Plan:  1. Jaw tremor  The patient has gained good improvement with gabapentin, the dose will be maintained at 100 mg 2 or 3 capsules daily. A prescription was sent in for the patient, she will follow-up in one year.  Jill Alexanders MD 09/16/2017 12:18 PM  [facility] [address] [place], [place] [address]  Phone [phone] Fax [phone]

## 2017-09-16 NOTE — Progress Notes (Signed)
Subjective:    Patient ID: Lisa Vega, female    DOB: January 10, 1932, 83 y.o.   MRN: [id number]  HPI Error     Medications and allergies reviewed with patient and updated if appropriate.  Patient Active Problem List   Diagnosis Date Noted  . Dysuria 08/06/2017  . Diabetes ([place]) 05/13/2017  . Fatigue 05/12/2017  . CAP (community acquired pneumonia) 05/03/2017  . Orthopnea 01/12/2017  . GERD (gastroesophageal reflux disease) 11/09/2016  . Age-related osteoporosis without current pathological fracture 10/14/2016  . Osteoporosis 05/06/2016  . Depression with anxiety 05/06/2016  . Hiatal hernia 05/06/2016  . Hypothyroidism 05/06/2016  . Tremor 10/28/2015  . Memory difficulties 10/28/2015  . Hoarseness 10/14/2015  . History of vitamin D deficiency 05/02/2015  . Other constipation 01/29/2015  . Abdominal pain, chronic, epigastric 11/01/2014  . 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 ([place]) 05/30/2014  . Diverticulitis of colon without hemorrhage 08/07/2013  . Prolapse of female pelvic organs 06/22/2013  . Neck pain 03/10/2013  . Vaginal atrophy 03/09/2013  . Rectocele 03/09/2013  . Cystocele 03/09/2013  . Female pelvic pain 03/09/2013  . Urinary incontinence 03/09/2013  . COPD (chronic obstructive pulmonary disease) ([place]) 01/16/2011  . CARCINOMA, LUNG, NONSMALL CELL 12/17/2010  . Rheumatoid arthritis ([place]) 12/17/2010  . Dyspnea on exertion 12/17/2010    Current Outpatient Prescriptions on File Prior to Visit  Medication Sig Dispense Refill  . acetaminophen (TYLENOL) 325 MG tablet Take 650 mg by mouth every 6 (six) hours as needed.    . budesonide-formoterol (SYMBICORT) 160-4.5 MCG/ACT inhaler 2 puffs BID 3 Inhaler 3  . Cholecalciferol (VITAMIN D) 2000 units CAPS Take 2,000 Units by mouth daily.    . diazepam (VALIUM) 5 MG tablet Take 0.5 tablets (2.5 mg total) by mouth 2 (two) times daily as needed for anxiety. 30 tablet 3  . folic acid (FOLVITE) 1 MG tablet  Take 1 mg by mouth daily.    Marland Kitchen gabapentin (NEURONTIN) 100 MG capsule One capsule three times a day 270 capsule 3  . glucose blood (FREESTYLE LITE) test strip Use as instructed once daily to check sugars, prediabetes 100 each 3  . loratadine (CLARITIN) 10 MG tablet Take 10 mg by mouth daily.    . methotrexate 25 MG/ML SOLN Inject 8.08 mg into the skin once a week.     . NONFORMULARY OR COMPOUNDED ITEM Estradiol .02% 1 ML Prefilled Applicator Sig: apply vaginally twice a week #90 Day Supply with 4 refills 1 each 0  . omeprazole (PRILOSEC) 40 MG capsule Take 1 capsule (40 mg total) by mouth daily. 90 capsule 3  . predniSONE (DELTASONE) 20 MG tablet Take 1 tablet (20 mg total) by mouth daily with breakfast. 5 tablet 0  . PROAIR HFA 108 (90 Base) MCG/ACT inhaler USE 1-2 PUFFS EVERY 4-6 HOURS AS NEEDED 8.5 g 1  . ranitidine (ZANTAC) 150 MG tablet Take 1 tablet (150 mg total) by mouth at bedtime as needed for heartburn. 90 tablet 1  . sertraline (ZOLOFT) 100 MG tablet Take 1 tablet (100 mg total) by mouth daily. 90 tablet 1  . SYNTHROID 88 MCG tablet TAKE 1 TABLET DAILY 90 tablet 0  . SYNTHROID 88 MCG tablet TAKE 1 TABLET DAILY 90 tablet 0   No current facility-administered medications on file prior to visit.     Past Medical History:  Diagnosis Date  . Cancer ([place])    lung ca  . COPD (chronic obstructive pulmonary disease) ([place])   . Dyspnea   .  GERD (gastroesophageal reflux disease)   . Hypercholesteremia   . Memory difficulties 10/28/2015  . Rheumatoid arthritis(714.0)   . Thyroid disease   . Tremor 10/28/2015   Jaw tremor  . Uterine prolapse   . Vitamin D deficiency     Past Surgical History:  Procedure Laterality Date  . APPENDECTOMY    . HEMORRHOID SURGERY    . right lobectomy  10/2004  . TONSILLECTOMY AND ADENOIDECTOMY      Social History   Social History  . Marital status: Married    Spouse name: N/A  . Number of children: 3  . Years of education: LPN    Occupational History  . 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 Smoking status: Former Smoker    Packs/day: 1.00    Years: 20.00    Types: Cigarettes    Quit date: 11/10/2004  . Smokeless tobacco: Never Used  . Alcohol use 0.0 oz/week     Comment: socially  . Drug use: No  . Sexual activity: Not Currently   Other Topics Concern  . Not on file   Social History Narrative   Patient drinks about 3 cups of coffee daily.   Patient is right handed.     Family History  Problem Relation Age of Onset  . Emphysema Mother   . Asthma Mother   . Lung cancer Father   . Pancreatic cancer Father   . Bone cancer Father   . Heart disease Brother        x1  . Heart disease Sister        x2  . Stroke Maternal Grandfather     Review of Systems     Objective:  There were no vitals filed for this visit. Wt Readings from Last 3 Encounters:  09/16/17 147 lb 8 oz (66.9 kg)  09/03/17 149 lb (67.6 kg)  08/18/17 150 lb 9.6 oz (68.3 kg)   There is no height or weight on file to calculate BMI.   Physical Exam         Assessment & Plan:    See Problem List for Assessment and Plan of chronic medical problems.    This encounter was created in error - please disregard.

## 2017-09-17 ENCOUNTER — Encounter: Payer: Medicare Other | Admitting: Internal Medicine

## 2017-09-21 NOTE — Progress Notes (Signed)
Subjective:    Patient ID: Lisa Vega, female    DOB: January 29, 1932, 83 y.o.   MRN: [id number]  HPI The patient is here for follow up.  Hypothyroidism:  She is taking her medication daily.  She denies any recent changes in energy or weight that are unexplained.   Anxiety, depression: She is taking her sertraline daily.  She denies any side effects from the medication. She feels her generalized anxiety and depression are well controlled and she is happy with her current dose of medication.   Anxiety/ difficulty sleeping:  She rarely takes valium at night if she is not able to sleep, which may be related to some anxiety.  When she does take it she often will feel groggy in the morning and wonders if there is a different option.  Diabetes: She is controlling her sugars with diet. She is compliant with a diabetic diet. She is active but not exercising regularly.  She checks her feet daily and denies foot lesions. She is up-to-date with an ophthalmology examination.   GERD:  She is taking her medication daily as prescribed.  She rarely has GERD symptoms and feels her GERD is well controlled.   She has lost some weight.  Her diet changed when she moved into her daughter's house.  She eats more take out foods.  She often vomits after eating these type of foods. She was initially more active, but now less active.  Her weight has been stable over this month.   Medications and allergies reviewed with patient and updated if appropriate.  Patient Active Problem List   Diagnosis Date Noted  . Dysuria 08/06/2017  . Diabetes ([place]) 05/13/2017  . Fatigue 05/12/2017  . CAP (community acquired pneumonia) 05/03/2017  . Orthopnea 01/12/2017  . GERD (gastroesophageal reflux disease) 11/09/2016  . Age-related osteoporosis without current pathological fracture 10/14/2016  . Osteoporosis 05/06/2016  . Depression with anxiety 05/06/2016  . Hiatal hernia 05/06/2016  . Hypothyroidism 05/06/2016  .  Tremor 10/28/2015  . Memory difficulties 10/28/2015  . Hoarseness 10/14/2015  . History of vitamin D deficiency 05/02/2015  . Other constipation 01/29/2015  . Abdominal pain, chronic, epigastric 11/01/2014  . 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 ([place]) 05/30/2014  . Diverticulitis of colon without hemorrhage 08/07/2013  . Prolapse of female pelvic organs 06/22/2013  . Neck pain 03/10/2013  . Vaginal atrophy 03/09/2013  . Rectocele 03/09/2013  . Cystocele 03/09/2013  . Female pelvic pain 03/09/2013  . Urinary incontinence 03/09/2013  . COPD (chronic obstructive pulmonary disease) ([place]) 01/16/2011  . CARCINOMA, LUNG, NONSMALL CELL 12/17/2010  . Rheumatoid arthritis ([place]) 12/17/2010  . Dyspnea on exertion 12/17/2010    Current Outpatient Prescriptions on File Prior to Visit  Medication Sig Dispense Refill  . acetaminophen (TYLENOL) 325 MG tablet Take 650 mg by mouth every 6 (six) hours as needed.    . budesonide-formoterol (SYMBICORT) 160-4.5 MCG/ACT inhaler 2 puffs BID 3 Inhaler 3  . Cholecalciferol (VITAMIN D) 2000 units CAPS Take 2,000 Units by mouth daily.    . diazepam (VALIUM) 5 MG tablet Take 0.5 tablets (2.5 mg total) by mouth 2 (two) times daily as needed for anxiety. 30 tablet 3  . folic acid (FOLVITE) 1 MG tablet Take 1 mg by mouth daily.    Marland Kitchen gabapentin (NEURONTIN) 100 MG capsule One capsule three times a day 270 capsule 3  . glucose blood (FREESTYLE LITE) test strip Use as instructed once daily to check sugars, prediabetes 100 each 3  .  loratadine (CLARITIN) 10 MG tablet Take 10 mg by mouth daily.    . methotrexate 25 MG/ML SOLN Inject 8.08 mg into the skin once a week.     . NONFORMULARY OR COMPOUNDED ITEM Estradiol .02% 1 ML Prefilled Applicator Sig: apply vaginally twice a week #90 Day Supply with 4 refills 1 each 0  . omeprazole (PRILOSEC) 40 MG capsule Take 1 capsule (40 mg total) by mouth daily. 90 capsule 3  . PROAIR HFA 108 (90 Base) MCG/ACT inhaler  USE 1-2 PUFFS EVERY 4-6 HOURS AS NEEDED 8.5 g 1  . ranitidine (ZANTAC) 150 MG tablet Take 1 tablet (150 mg total) by mouth at bedtime as needed for heartburn. 90 tablet 1  . sertraline (ZOLOFT) 100 MG tablet Take 1 tablet (100 mg total) by mouth daily. 90 tablet 1  . SYNTHROID 88 MCG tablet TAKE 1 TABLET DAILY 90 tablet 0   No current facility-administered medications on file prior to visit.     Past Medical History:  Diagnosis Date  . Cancer ([place])    lung ca  . COPD (chronic obstructive pulmonary disease) ([place])   . Dyspnea   . GERD (gastroesophageal reflux disease)   . Hypercholesteremia   . Memory difficulties 10/28/2015  . Rheumatoid arthritis(714.0)   . Thyroid disease   . Tremor 10/28/2015   Jaw tremor  . Uterine prolapse   . Vitamin D deficiency     Past Surgical History:  Procedure Laterality Date  . APPENDECTOMY    . HEMORRHOID SURGERY    . right lobectomy  10/2004  . TONSILLECTOMY AND ADENOIDECTOMY      Social History   Social History  . Marital status: Married    Spouse name: N/A  . Number of children: 3  . Years of education: LPN   Occupational History  . retired Retired    Corporate treasurer at [employer]  . Smoking status: Former Smoker    Packs/day: 1.00    Years: 20.00    Types: Cigarettes    Quit date: 11/10/2004  . Smokeless tobacco: Never Used  . Alcohol use 0.0 oz/week     Comment: socially  . Drug use: No  . Sexual activity: Not Currently   Other Topics Concern  . None   Social History Narrative   Patient drinks about 3 cups of coffee daily.   Patient is right handed.     Family History  Problem Relation Age of Onset  . Emphysema Mother   . Asthma Mother   . Lung cancer Father   . Pancreatic cancer Father   . Bone cancer Father   . Heart disease Brother        x1  . Heart disease Sister        x2  . Stroke Maternal Grandfather     Review of Systems  Constitutional: Negative for chills and  fever.  Respiratory: Positive for cough (occ), shortness of breath (chronic, slightly worse since sick in may) and wheezing (occ).   Cardiovascular: Negative for chest pain, palpitations and leg swelling.  Neurological: Positive for light-headedness and headaches.       Objective:   Vitals:   09/22/17 1151  BP: (!) 150/84  Pulse: 72  Resp: 16  Temp: 98.7 F (37.1 C)  SpO2: 93%   Wt Readings from Last 3 Encounters:  09/22/17 149 lb (67.6 kg)  09/16/17 147 lb 8 oz (66.9 kg)  09/03/17 149 lb (67.6 kg)  Body mass index is 25.38 kg/m.   Physical Exam    Constitutional: Appears well-developed and well-nourished. No distress.  HENT:  Head: Normocephalic and atraumatic.  Neck: Neck supple. No tracheal deviation present. No thyromegaly present.  No cervical lymphadenopathy Cardiovascular: Normal rate, regular rhythm and normal heart sounds.   No murmur heard. No carotid bruit .  No edema Pulmonary/Chest: Effort normal and breath sounds normal. No respiratory distress. No has no wheezes. No rales.  Skin: Skin is warm and dry. Not diaphoretic.  Psychiatric: Normal mood and affect. Behavior is normal.      Assessment & Plan:    See Problem List for Assessment and Plan of chronic medical problems.

## 2017-09-22 ENCOUNTER — Ambulatory Visit (INDEPENDENT_AMBULATORY_CARE_PROVIDER_SITE_OTHER): Payer: Medicare Other | Admitting: Internal Medicine

## 2017-09-22 ENCOUNTER — Other Ambulatory Visit: Payer: Self-pay | Admitting: Gastroenterology

## 2017-09-22 ENCOUNTER — Other Ambulatory Visit (INDEPENDENT_AMBULATORY_CARE_PROVIDER_SITE_OTHER): Payer: Medicare Other

## 2017-09-22 ENCOUNTER — Encounter: Payer: Self-pay | Admitting: Internal Medicine

## 2017-09-22 VITALS — BP 150/84 | HR 72 | Temp 98.7°F | Resp 16 | Wt 149.0 lb

## 2017-09-22 DIAGNOSIS — R1310 Dysphagia, unspecified: Secondary | ICD-10-CM | POA: Diagnosis not present

## 2017-09-22 DIAGNOSIS — E119 Type 2 diabetes mellitus without complications: Secondary | ICD-10-CM

## 2017-09-22 DIAGNOSIS — G479 Sleep disorder, unspecified: Secondary | ICD-10-CM | POA: Diagnosis not present

## 2017-09-22 DIAGNOSIS — E038 Other specified hypothyroidism: Secondary | ICD-10-CM

## 2017-09-22 DIAGNOSIS — Z23 Encounter for immunization: Secondary | ICD-10-CM | POA: Diagnosis not present

## 2017-09-22 DIAGNOSIS — K219 Gastro-esophageal reflux disease without esophagitis: Secondary | ICD-10-CM

## 2017-09-22 DIAGNOSIS — F418 Other specified anxiety disorders: Secondary | ICD-10-CM | POA: Diagnosis not present

## 2017-09-22 LAB — COMPREHENSIVE METABOLIC PANEL
ALT: 10 U/L (ref 0–35)
AST: 14 U/L (ref 0–37)
Albumin: 3.5 g/dL (ref 3.5–5.2)
Alkaline Phosphatase: 69 U/L (ref 39–117)
BUN: 8 mg/dL (ref 6–23)
CO2: 34 mEq/L — ABNORMAL HIGH (ref 19–32)
Calcium: 9.4 mg/dL (ref 8.4–10.5)
Chloride: 96 mEq/L (ref 96–112)
Creatinine, Ser: 0.8 mg/dL (ref 0.40–1.20)
GFR: 72.81 mL/min (ref >60.00)
Glucose, Bld: 254 mg/dL — ABNORMAL HIGH (ref 70–99)
Potassium: 3.2 mEq/L — ABNORMAL LOW (ref 3.5–5.1)
Sodium: 135 mEq/L (ref 135–145)
Total Bilirubin: 0.6 mg/dL (ref 0.2–1.2)
Total Protein: 6.7 g/dL (ref 6.0–8.3)

## 2017-09-22 LAB — HEMOGLOBIN A1C: Hgb A1c MFr Bld: 7.5 % — ABNORMAL HIGH (ref 4.6–6.5)

## 2017-09-22 LAB — TSH: TSH: 1.4 u[IU]/mL (ref 0.35–4.50)

## 2017-09-22 MED ORDER — ALPRAZOLAM 0.25 MG PO TABS: 0.2500 mg | ORAL_TABLET | Freq: Every day | ORAL | 1 refills | Status: DC | PRN

## 2017-09-22 NOTE — Assessment & Plan Note (Signed)
Check tsh  Titrate med dose if needed  

## 2017-09-22 NOTE — Patient Instructions (Signed)
  Test(s) ordered today. Your results will be released to [place] (or called to you) after review, usually within 72hours after test completion. If any changes need to be made, you will be notified at that same time.  All other Health Maintenance issues reviewed.   All recommended immunizations and age-appropriate screenings are up-to-date or discussed.  Flu immunization administered today.   Medications reviewed and updated.  Changes include stopping the valium and using xanax instead.  Your prescription(s) have been submitted to your pharmacy. Please take as directed and contact our office if you believe you are having problem(s) with the medication(s).    Please followup in 6 months

## 2017-09-22 NOTE — Assessment & Plan Note (Signed)
Diet controlled Fairly compliant with diet Active, not exercising Check a1c

## 2017-09-22 NOTE — Assessment & Plan Note (Signed)
GERD controlled Continue daily medication  

## 2017-09-22 NOTE — Assessment & Plan Note (Signed)
Controlled, stable Continue current dose of medication  

## 2017-09-22 NOTE — Assessment & Plan Note (Signed)
Occasional Related some to anxiety Takes valium as needed only - only 1/2 of a pill It makes her groggy in the morning -  Will d/c valium Start xanax - use only as needed

## 2017-09-24 ENCOUNTER — Other Ambulatory Visit: Payer: Self-pay | Admitting: Emergency Medicine

## 2017-09-24 MED ORDER — POTASSIUM CHLORIDE CRYS ER 20 MEQ PO TBCR: 20.0000 meq | EXTENDED_RELEASE_TABLET | Freq: Every day | ORAL | 2 refills | Status: DC

## 2017-09-30 ENCOUNTER — Ambulatory Visit
Admission: RE | Admit: 2017-09-30 | Discharge: 2017-09-30 | Disposition: A | Discharge status: Home / Self Care | Payer: Medicare Other | Source: Ambulatory Visit | Attending: Gastroenterology | Admitting: Gastroenterology

## 2017-09-30 DIAGNOSIS — R1310 Dysphagia, unspecified: Secondary | ICD-10-CM

## 2017-10-11 DIAGNOSIS — M25562 Pain in left knee: Secondary | ICD-10-CM | POA: Diagnosis not present

## 2017-10-11 DIAGNOSIS — M1712 Unilateral primary osteoarthritis, left knee: Secondary | ICD-10-CM | POA: Diagnosis not present

## 2017-10-11 DIAGNOSIS — G8929 Other chronic pain: Secondary | ICD-10-CM | POA: Diagnosis not present

## 2017-10-11 DIAGNOSIS — M47816 Spondylosis without myelopathy or radiculopathy, lumbar region: Secondary | ICD-10-CM | POA: Diagnosis not present

## 2017-10-15 ENCOUNTER — Encounter: Payer: Medicare Other | Admitting: Gynecology

## 2017-10-19 ENCOUNTER — Encounter: Payer: Medicare Other | Admitting: Gynecology

## 2017-10-19 DIAGNOSIS — E663 Overweight: Secondary | ICD-10-CM | POA: Diagnosis not present

## 2017-10-19 DIAGNOSIS — Z6825 Body mass index (BMI) 25.0-25.9, adult: Secondary | ICD-10-CM | POA: Diagnosis not present

## 2017-10-19 DIAGNOSIS — Z79899 Other long term (current) drug therapy: Secondary | ICD-10-CM | POA: Diagnosis not present

## 2017-10-19 DIAGNOSIS — M150 Primary generalized (osteo)arthritis: Secondary | ICD-10-CM | POA: Diagnosis not present

## 2017-10-19 DIAGNOSIS — R5382 Chronic fatigue, unspecified: Secondary | ICD-10-CM | POA: Diagnosis not present

## 2017-10-19 DIAGNOSIS — M2550 Pain in unspecified joint: Secondary | ICD-10-CM | POA: Diagnosis not present

## 2017-10-19 DIAGNOSIS — M0579 Rheumatoid arthritis with rheumatoid factor of multiple sites without organ or systems involvement: Secondary | ICD-10-CM | POA: Diagnosis not present

## 2017-10-29 ENCOUNTER — Encounter: Payer: Medicare Other | Admitting: Gynecology

## 2017-11-01 ENCOUNTER — Encounter: Payer: Self-pay | Admitting: Pulmonary Disease

## 2017-11-01 ENCOUNTER — Ambulatory Visit (INDEPENDENT_AMBULATORY_CARE_PROVIDER_SITE_OTHER): Payer: Medicare Other | Admitting: Pulmonary Disease

## 2017-11-01 ENCOUNTER — Other Ambulatory Visit (INDEPENDENT_AMBULATORY_CARE_PROVIDER_SITE_OTHER): Payer: Medicare Other

## 2017-11-01 VITALS — BP 138/76 | HR 70 | Ht 64.0 in | Wt 146.0 lb

## 2017-11-01 DIAGNOSIS — J449 Chronic obstructive pulmonary disease, unspecified: Secondary | ICD-10-CM

## 2017-11-01 DIAGNOSIS — Z01818 Encounter for other preprocedural examination: Secondary | ICD-10-CM

## 2017-11-01 DIAGNOSIS — R221 Localized swelling, mass and lump, neck: Secondary | ICD-10-CM

## 2017-11-01 DIAGNOSIS — J479 Bronchiectasis, uncomplicated: Secondary | ICD-10-CM | POA: Diagnosis not present

## 2017-11-01 LAB — BASIC METABOLIC PANEL
BUN: 10 mg/dL (ref 6–23)
CO2: 32 mEq/L (ref 19–32)
Calcium: 9.7 mg/dL (ref 8.4–10.5)
Chloride: 99 mEq/L (ref 96–112)
Creatinine, Ser: 0.83 mg/dL (ref 0.40–1.20)
GFR: 69.76 mL/min (ref >60.00)
Glucose, Bld: 142 mg/dL — ABNORMAL HIGH (ref 70–99)
Potassium: 3.8 mEq/L (ref 3.5–5.1)
Sodium: 137 mEq/L (ref 135–145)

## 2017-11-01 NOTE — Progress Notes (Signed)
Subjective:    Patient ID: Lisa Vega, female    DOB: [birth year], 83 y.o.   MRN: [id number]  Synopsis: Former patient of Dr. Gwenette Greet with COPD in addition history of lung cancer status post right lower lobectomy in 2005.  Also has rheumatoid arthritis and has been on methotrexate since 1986.  Recurrent pneumonia in 2018.  She smoked from her 58's until the year 2005.     HPI Chief Complaint  Patient presents with  . Follow-up    pt has good and bad days, does note fatigue, sob, prod cough with clear mucus after using inhalers only.     Since March of 2017 she says she has lost 20 pounds and she can't figure out why.    She says that for the last several months she has not felt well.   > she says that in May she had a fever and dyspnea > she went to the doctor after a week and was diagnosed with pneumonia   She says that her rheumatoid arthritis has been pretty bad lately, she can't move her neck lately as well as normal.  She says that they changed her methotrexate to [place] (leflunomide) on September 13.  Since then she says that doesn't feel as sick as she was before, she doesn't think her breathing has worsened or improved.    She hasn't had an appetite.  She has eaten a lot of junk food.    She coughs at night, mostly after Symbicort.  She will produce clear mucus.  No recent fever.  She has night sweats at night.    Past Medical History:  Diagnosis Date  . Cancer ([place])    lung ca  . COPD (chronic obstructive pulmonary disease) ([place])   . Dyspnea   . GERD (gastroesophageal reflux disease)   . Hypercholesteremia   . Memory difficulties 10/28/2015  . Rheumatoid arthritis(714.0)   . Thyroid disease   . Tremor 10/28/2015   Jaw tremor  . Uterine prolapse   . Vitamin D deficiency        Review of Systems  Constitutional: Negative for chills, fatigue and fever.  HENT: Negative for postnasal drip, rhinorrhea and sinus pressure.   Respiratory: Positive for shortness  of breath. Negative for cough and wheezing.   Cardiovascular: Negative for chest pain, palpitations and leg swelling.       Objective:   Physical Exam Vitals:   11/01/17 1138  BP: 138/76  Pulse: 70  SpO2: 98%  Weight: 146 lb (66.2 kg)  Height: 5\' 4"  (1.626 m)    RA  Gen: chronically ill appearing, no acute distress HENT: NCAT, OP clear, neck supple but there is a soft swelling over her left lower neck, not hard, not tender Lymph: no cervical lymphadenopathy PULM: CTA B CV: RRR, no mgr, no JVD GI: BS+, soft, nontender, no hsm Derm: no rash or skin breakdown MSK: normal bulk and tone Neuro: A&Ox4, CN II-XII intact, strength 5/5 in all 4 extremities Psyche: normal mood and affect   PFT PFT's 2013:  FEV1 1.49 (81%), ratio 60, TLC 86%, DLCO 65% (stable to better from 2007)  Chest imaging: CXR 12/2011:  No acute process. August 2018 CT chest: Right upper lobe pneumonia and bronchiectasis noted, faint nodule suggestive of hypersensitivity pneumonitis versus RBI LD, other nodules present status post right lower lobectomy, images independently reviewed  Other imaging: 07/2017 SLP eval: no aspiration 09/2017 Esophogram: IMPRESSION:, No laryngeal penetration, likely chronic reflux, mild presbyesophagus, no  stricture a small reducible hiatal hernia no obstruction to liquid or barium tablet   2013 echocardiogram LVEF 50-55%, normal valvular dysfunction, abnormal septal motion consistent with bundle branch block  BMET    Component Value Date/Time   NA 135 09/22/2017 1234   NA 139 08/10/2011 1457   K 3.2 (L) 09/22/2017 1234   K 4.4 08/10/2011 1457   CL 96 09/22/2017 1234   CL 98 08/10/2011 1457   CO2 34 (H) 09/22/2017 1234   CO2 29 08/10/2011 1457   GLUCOSE 254 (H) 09/22/2017 1234   GLUCOSE 98 08/10/2011 1457   BUN 8 09/22/2017 1234   BUN 15 08/10/2011 1457   CREATININE 0.80 09/22/2017 1234   CREATININE 0.9 08/10/2011 1457   CALCIUM 9.4 09/22/2017 1234   CALCIUM 8.9  08/10/2011 1457   GFRNONAA 66 (L) 01/15/2014 1817   GFRAA 77 (L) 01/15/2014 1817        Assessment & Plan:    Pre-procedural examination - Plan: Basic Metabolic Panel (BMET)  Localized swelling, mass or lump of neck - Plan: CT SOFT TISSUE NECK W CONTRAST  Bronchiectasis without complication ([place])  COPD with chronic bronchitis and emphysema ([place])  Discussion: This year she has struggled with increasing shortness of breath, weakness, and cough in the setting of worsening rheumatoid arthritis symptoms.  Differential diagnosis here is broad, she clearly had pneumonia earlier in the year.  By my review of the CT scan of her chest in August 2018 I am concerned about the fact that she may have developed methotrexate sensitivity because she had centrilobular groundglass nodules particularly in the left upper lobe.  This finding would go along with methotrexate toxicity or hypersensitivity pneumonitis.  However, because of the bronchiectasis and the fact that she is immunosuppressed, the right upper lobe nodules could represent an atypical infection.  We talked about various options today, we think the best approach is to keep her away from methotrexate and then follow her up in 6 weeks.  If she still struggling with cough and shortness of breath and we can arrange for a bronchoscopy to look for an atypical infection at that point.  Otherwise, I anticipate she will continue to improve if this was in fact methotrexate toxicity.  In addition, she describes a new onset neck mass which looks like a lipoma to me.  However because of the abrupt onset I think she needs to have this evaluated.  Plan COPD: Continue Symbicort twice a day I'm glad you had a flu shot Use albuterol as needed for shortness of breath  Abnormal finding on the CT scan of your chest: If you are still feeling poorly in 6 weeks then we will perform a bronchoscopy to look for an abnormal infection  For the mass/swelling in the  left side of your neck: We will check a CT scan and call you with the results  We will see you back in 6 weeks or sooner if needed    Current Outpatient Medications:  .  acetaminophen (TYLENOL) 325 MG tablet, Take 650 mg by mouth every 6 (six) hours as needed., Disp: , Rfl:  .  ALPRAZolam (XANAX) 0.25 MG tablet, Take 1 tablet (0.25 mg total) by mouth daily as needed for anxiety., Disp: 20 tablet, Rfl: 1 .  budesonide-formoterol (SYMBICORT) 160-4.5 MCG/ACT inhaler, 2 puffs BID, Disp: 3 Inhaler, Rfl: 3 .  Cholecalciferol (VITAMIN D) 2000 units CAPS, Take 2,000 Units by mouth daily., Disp: , Rfl:  .  folic acid (FOLVITE) 1 MG  tablet, Take 1 mg by mouth daily., Disp: , Rfl:  .  gabapentin (NEURONTIN) 100 MG capsule, One capsule three times a day, Disp: 270 capsule, Rfl: 3 .  glucose blood (FREESTYLE LITE) test strip, Use as instructed once daily to check sugars, prediabetes, Disp: 100 each, Rfl: 3 .  loratadine (CLARITIN) 10 MG tablet, Take 10 mg by mouth daily., Disp: , Rfl:  .  NONFORMULARY OR COMPOUNDED ITEM, Estradiol .02% 1 ML Prefilled Applicator Sig: apply vaginally twice a week #90 Day Supply with 4 refills, Disp: 1 each, Rfl: 0 .  omeprazole (PRILOSEC) 40 MG capsule, Take 1 capsule (40 mg total) by mouth daily., Disp: 90 capsule, Rfl: 3 .  potassium chloride SA (K-DUR,KLOR-CON) 20 MEQ tablet, Take 1 tablet (20 mEq total) by mouth daily., Disp: 30 tablet, Rfl: 2 .  PROAIR HFA 108 (90 Base) MCG/ACT inhaler, USE 1-2 PUFFS EVERY 4-6 HOURS AS NEEDED, Disp: 8.5 g, Rfl: 1 .  ranitidine (ZANTAC) 150 MG tablet, Take 1 tablet (150 mg total) by mouth at bedtime as needed for heartburn., Disp: 90 tablet, Rfl: 1 .  sertraline (ZOLOFT) 100 MG tablet, Take 1 tablet (100 mg total) by mouth daily., Disp: 90 tablet, Rfl: 1 .  SYNTHROID 88 MCG tablet, TAKE 1 TABLET DAILY, Disp: 90 tablet, Rfl: 0

## 2017-11-01 NOTE — Patient Instructions (Addendum)
COPD: Continue Symbicort twice a day I'm glad you had a flu shot Use albuterol as needed for shortness of breath  Abnormal finding on the CT scan of your chest: If you are still feeling poorly in 6 weeks then we will perform a bronchoscopy to look for an abnormal infection  For the mass/swelling in the left side of your neck: We will check a CT scan and call you with the results  We will see you back in 6 weeks or sooner if needed

## 2017-11-08 ENCOUNTER — Ambulatory Visit (INDEPENDENT_AMBULATORY_CARE_PROVIDER_SITE_OTHER)
Admission: RE | Admit: 2017-11-08 | Discharge: 2017-11-08 | Disposition: A | Discharge status: Home / Self Care | Payer: Medicare Other | Source: Ambulatory Visit | Attending: Pulmonary Disease | Admitting: Pulmonary Disease

## 2017-11-08 DIAGNOSIS — R221 Localized swelling, mass and lump, neck: Secondary | ICD-10-CM

## 2017-11-08 MED ORDER — IOPAMIDOL (ISOVUE-300) INJECTION 61%
75.0000 mL | Freq: Once | INTRAVENOUS | Status: AC | PRN
  Administered 2017-11-08: 75 mL via INTRAVENOUS

## 2017-11-09 ENCOUNTER — Telehealth: Payer: Self-pay | Admitting: Pulmonary Disease

## 2017-11-09 NOTE — Telephone Encounter (Signed)
This area of concern seems to be a fat pad. Appears benign   Dr. Lake Bells will look over CT as well and give final interpretation .   Does shows some plaque build up in arteries would discuss with PCP if further testing is indicated.   Please fax to PCP .    Please contact office for sooner follow up if symptoms do not improve or worsen or seek emergency care

## 2017-11-09 NOTE — Telephone Encounter (Signed)
TP can you review CT report?

## 2017-11-09 NOTE — Telephone Encounter (Signed)
Spoke with pt, aware of recs. Pt is requesting BQ's follow-up recs regarding this. BQ please advise.  Thanks.

## 2017-11-10 NOTE — Telephone Encounter (Signed)
Pt checking on test results [phone]

## 2017-11-10 NOTE — Telephone Encounter (Signed)
A, Please let the patient know this showed that the swelling in her neck is fat but doesn't look alarming. Thanks, B ---------- Pt is aware of recs.  Pt is concerned that she is still having headaches and is feeling very fatigued.  Pt requesting additional recs.  BQ please advise.  Thanks.

## 2017-11-10 NOTE — Telephone Encounter (Signed)
Advised pt of results. Pt understood and nothing further is needed.   

## 2017-11-12 ENCOUNTER — Ambulatory Visit (INDEPENDENT_AMBULATORY_CARE_PROVIDER_SITE_OTHER): Payer: Medicare Other | Admitting: Obstetrics & Gynecology

## 2017-11-12 ENCOUNTER — Encounter: Payer: Self-pay | Admitting: Obstetrics & Gynecology

## 2017-11-12 VITALS — BP 118/76

## 2017-11-12 DIAGNOSIS — N898 Other specified noninflammatory disorders of vagina: Secondary | ICD-10-CM | POA: Diagnosis not present

## 2017-11-12 DIAGNOSIS — Z9289 Personal history of other medical treatment: Secondary | ICD-10-CM

## 2017-11-12 DIAGNOSIS — R300 Dysuria: Secondary | ICD-10-CM

## 2017-11-12 DIAGNOSIS — R102 Pelvic and perineal pain: Secondary | ICD-10-CM

## 2017-11-12 DIAGNOSIS — Z960 Presence of urogenital implants: Secondary | ICD-10-CM

## 2017-11-12 LAB — WET PREP FOR TRICH, YEAST, CLUE

## 2017-11-12 MED ORDER — SULFAMETHOXAZOLE-TRIMETHOPRIM 800-160 MG PO TABS: 1.0000 | ORAL_TABLET | Freq: Two times a day (BID) | ORAL | 0 refills | Status: AC

## 2017-11-12 MED ORDER — TRIAMCINOLONE ACETONIDE 0.1 % EX CREA
1.0000 | TOPICAL_CREAM | Freq: Every day | CUTANEOUS | 3 refills | Status: DC | PRN
Start: 2017-11-12 — End: 2018-10-03

## 2017-11-12 NOTE — Progress Notes (Signed)
    Lisa Vega [birth year] [id number]        83 y.o.  [id number]   RP:  Pessary with Vaginal pain/difficulty passing stools x 1 week  HPI:  Sxs of pain and difficulty passing stools present x 1 week.  Pessary didn't fall off, but patient has tried to push back in a better position.  Difficulty passing stools/pain.  Vaginal d/c wnl, no itching or odor.  No vaginal or rectal bleeding.  No UTI Sx.  Past medical history,surgical history, problem list, medications, allergies, family history and social history were all reviewed and documented in the EPIC chart.  Directed ROS with pertinent positives and negatives documented in the history of present illness/assessment and plan.  Exam:  Vitals:   11/12/17 1405  BP: 118/76   General appearance:  Normal  Gyn exam:  Vulva normal.  Pessary removed easily and cleaned.  Whitish vaginal d/c present, wet prep done.  No ulceration, no erythema in vagina or at cervix.  Bimanual exam:  Cervix tender to touch, per patient reproduces the pain she's had for a week.  Uterus AV, normal volume, mobile.  No Adnexal mass/NT.  Pessary put back in place with care to avoid contact with the cervix.  Wet prep:  Negative  U/A: Cloudy.  WBC 6-10, RBC 0-2, Bacteria many.  Nitrite neg.  Pending U. Culture.   Assessment/Plan:  83 y.o. [id number]   1. Vaginal pain Probably due to malposition of Milex Ring pessary with support.    2. Vaginal pessary present Removed/cleaned/put back in place in a better position, avoiding contact with cervix.  Per patient, much more comfortable after reinsertion.  Declines trying other types of pessaries/sizes.  3. Vaginal discharge Wet prep negative.  Reassured. - WET PREP FOR [place], YEAST, CLUE  4. Dysuria Decision to treat with Bactrim.  Usage/risks reviewed.  U. Culture pending. - Urinalysis with Culture Reflex  Counseling on above issues >50% x 25 minutes.  Princess Bruins MD, 2:36 PM 11/12/2017

## 2017-11-13 ENCOUNTER — Encounter: Payer: Self-pay | Admitting: Obstetrics & Gynecology

## 2017-11-13 NOTE — Patient Instructions (Signed)
1. Vaginal pain Probably due to malposition of Milex Ring pessary with support.    2. Vaginal pessary present Removed/cleaned/put back in place in a better position, avoiding contact with cervix.  Per patient, much more comfortable after reinsertion.  Declines trying other types of pessaries/sizes.  3. Vaginal discharge Wet prep negative.  Reassured. - WET PREP FOR [place], YEAST, CLUE  4. Dysuria Decision to treat with Bactrim.  Usage/risks reviewed.  U. Culture pending. - Urinalysis with Culture Reflex  Ardys, it was a pleasure meeting you today!  I will see you again soon for your annual gynecologic exam.

## 2017-11-15 LAB — URINE CULTURE
MICRO NUMBER:: 81296872
SPECIMEN QUALITY:: ADEQUATE

## 2017-11-15 LAB — URINALYSIS W MICROSCOPIC + REFLEX CULTURE
Bilirubin Urine: NEGATIVE
Glucose, UA: NEGATIVE
Ketones, ur: NEGATIVE
Nitrites, Initial: NEGATIVE
Protein, ur: NEGATIVE
Specific Gravity, Urine: 1.015 (ref 1.001–1.03)
pH: 5 (ref 5.0–8.0)

## 2017-11-15 LAB — CULTURE INDICATED

## 2017-11-16 ENCOUNTER — Encounter: Payer: Medicare Other | Admitting: Gynecology

## 2017-11-17 ENCOUNTER — Encounter: Payer: Self-pay | Admitting: *Deleted

## 2017-11-19 ENCOUNTER — Ambulatory Visit: Payer: Medicare Other | Admitting: Internal Medicine

## 2017-11-24 ENCOUNTER — Telehealth: Payer: Self-pay | Admitting: Medical Oncology

## 2017-11-24 NOTE — Telephone Encounter (Signed)
reqeusted appt for lump in her neck.  last appt with [facility] on 05/11/11.

## 2017-11-24 NOTE — Telephone Encounter (Signed)
First available new patient appointment with me or other providers in 2-3 weeks.

## 2017-11-26 ENCOUNTER — Encounter: Payer: Medicare Other | Admitting: Obstetrics & Gynecology

## 2017-11-27 DIAGNOSIS — M542 Cervicalgia: Secondary | ICD-10-CM | POA: Diagnosis not present

## 2017-11-27 DIAGNOSIS — M1712 Unilateral primary osteoarthritis, left knee: Secondary | ICD-10-CM | POA: Diagnosis not present

## 2017-11-27 DIAGNOSIS — M50322 Other cervical disc degeneration at C5-C6 level: Secondary | ICD-10-CM | POA: Diagnosis not present

## 2017-11-29 ENCOUNTER — Other Ambulatory Visit: Payer: Self-pay | Admitting: Internal Medicine

## 2017-11-29 DIAGNOSIS — H8111 Benign paroxysmal vertigo, right ear: Secondary | ICD-10-CM | POA: Diagnosis not present

## 2017-11-29 DIAGNOSIS — Z85118 Personal history of other malignant neoplasm of bronchus and lung: Secondary | ICD-10-CM | POA: Insufficient documentation

## 2017-11-29 DIAGNOSIS — J449 Chronic obstructive pulmonary disease, unspecified: Secondary | ICD-10-CM | POA: Diagnosis not present

## 2017-11-29 DIAGNOSIS — R221 Localized swelling, mass and lump, neck: Secondary | ICD-10-CM | POA: Diagnosis not present

## 2017-12-01 ENCOUNTER — Other Ambulatory Visit: Payer: Self-pay | Admitting: Internal Medicine

## 2017-12-03 ENCOUNTER — Telehealth: Payer: Self-pay | Admitting: *Deleted

## 2017-12-03 ENCOUNTER — Encounter: Payer: Self-pay | Admitting: Obstetrics & Gynecology

## 2017-12-03 ENCOUNTER — Ambulatory Visit (INDEPENDENT_AMBULATORY_CARE_PROVIDER_SITE_OTHER): Payer: Medicare Other | Admitting: Obstetrics & Gynecology

## 2017-12-03 VITALS — BP 130/82 | Ht 62.0 in | Wt 147.0 lb

## 2017-12-03 DIAGNOSIS — I6523 Occlusion and stenosis of bilateral carotid arteries: Secondary | ICD-10-CM

## 2017-12-03 DIAGNOSIS — R350 Frequency of micturition: Secondary | ICD-10-CM | POA: Diagnosis not present

## 2017-12-03 DIAGNOSIS — Z124 Encounter for screening for malignant neoplasm of cervix: Secondary | ICD-10-CM

## 2017-12-03 DIAGNOSIS — Z01411 Encounter for gynecological examination (general) (routine) with abnormal findings: Secondary | ICD-10-CM | POA: Diagnosis not present

## 2017-12-03 DIAGNOSIS — Z4689 Encounter for fitting and adjustment of other specified devices: Secondary | ICD-10-CM | POA: Diagnosis not present

## 2017-12-03 DIAGNOSIS — Z780 Asymptomatic menopausal state: Secondary | ICD-10-CM

## 2017-12-03 NOTE — Patient Instructions (Signed)
1. Encounter for gynecological examination with abnormal finding Gynecologic exam with uterine prolapse and vulvovaginal atrophy of menopause.  Pap reflex done.  Breast exam normal.  Not doing screening mammograms anymore.  Last colonoscopy 2017.  Health labs with family physician.  2. Menopause present No HRT.  No PMB.  Vitamin D supplements and calcium in nutrition with weightbearing physical activity recommended.  Last bone density 2017.  3. Pessary maintenance Well with Milex ring with support pessary, good fit.  Cleaned and reinserted.  Vaginal mucosa intact.  Will d/c Estrogen compound cream and start Replens moisturizer.  4. Urinary frequency -U/A reflex U. Culture  5. Atherosclerosis of both carotid arteries Patient has many symptoms and does not feel well in general.  Complains of dizziness and pain towards the left arm.  Recent CT scan showed bilateral advanced atherosclerosis at the carotid bifurcation.  Refer to Cardiology.  Lisa Vega, it was a pleasure seeing you today!  I will inform you of your results as soon as available.  Carotid Artery Disease The carotid arteries are the two main arteries on either side of the neck that supply blood to the brain. Carotid artery disease, also called carotid artery stenosis, is the narrowing or blockage of one or both carotid arteries. Carotid artery disease increases your risk for a stroke or a transient ischemic attack (TIA). A TIA is an episode in which a waxy, fatty substance that accumulates within the artery (plaque) blocks blood flow to the brain. A TIA is considered a "warning stroke." What are the causes?  Buildup of plaque inside the carotid arteries (atherosclerosis) (common).  A weakened outpouching in an artery (aneurysm).  Inflammation of the carotid artery (arteritis).  A fibrous growth within the carotid artery (fibromuscular dysplasia).  Tissue death within the carotid artery due to radiation treatment (post-radiation  necrosis).  Decreased blood flow due to spasms of the carotid artery (vasospasm).  Separation of the walls of the carotid artery (carotid dissection). What increases the risk?  High cholesterol (dyslipidemia).  High blood pressure (hypertension).  Smoking.  Obesity.  Diabetes.  Family history of cardiovascular disease.  Inactivity or lack of regular exercise.  Being female. Men have an increased risk of developing atherosclerosis earlier in life than women. What are the signs or symptoms? Carotid artery disease does not cause symptoms. How is this diagnosed? Diagnosis of carotid artery disease may include:  A physical exam. Your health care provider may hear an abnormal sound (bruit) when listening to the carotid arteries.  Specific tests that look at the blood flow in the carotid arteries. These tests include: ? Carotid artery ultrasonography. ? Carotid or cerebral angiography. ? Computerized tomographic angiography (CTA). ? Magnetic resonance angiography (MRA).  How is this treated? Treatment of carotid artery disease can include a combination of treatments. Treatment options include:  Surgery. You may have: ? A carotid endarterectomy. This is a surgery to remove the blockages in the carotid arteries. ? A carotid angioplasty with stenting. This is a nonsurgical interventional procedure. A wire mesh (stent) is used to widen the blocked carotid arteries.  Medicines to control blood pressure, cholesterol, and reduce blood clotting (antiplatelet therapy).  Adjusting your diet.  Lifestyle changes such as: ? Quitting smoking. ? Exercising as tolerated or as directed by your health care provider. ? Controlling and maintaining a good blood pressure. ? Keeping cholesterol levels under control.  Follow these instructions at home:  Take medicines only as directed by your health care provider. Make sure you understand  all your medicine instructions. Do not stop your  medicines without talking to your health care provider.  Follow your health care provider's diet instructions. It is important to eat a healthy diet that is low in saturated fats and includes plenty of fresh fruits, vegetables, and lean meats. High-fat, high-sodium foods as well as foods that are fried, overly processed, or have poor nutritional value should be avoided.  Maintain a healthy weight.  Stay physically active. It is recommended that you get at least 30 minutes of activity every day.  Do not use any tobacco products including cigarettes, chewing tobacco, or electronic cigarettes. If you need help quitting, ask your health care provider.  Limit alcohol use to: ? No more than 2 drinks per day for men. ? No more than 1 drink per day for nonpregnant women.  Do not use illegal drugs.  Keep all follow-up visits as directed by your health care provider. Get help right away if: You develop TIA or stroke symptoms. These include:  Sudden weakness or numbness on one side of the body, such as in the face, arm, or leg.  Sudden confusion.  Trouble speaking (aphasia) or understanding.  Sudden trouble seeing out of one or both eyes.  Sudden trouble walking.  Dizziness or feeling like you might faint.  Loss of balance or coordination.  Sudden severe headache with no known cause.  Sudden trouble swallowing (dysphagia).  If you have any of these symptoms, call your local emergency services (911 in [place] Do not drive yourself to the clinic or hospital. This is a medical emergency. This information is not intended to replace advice given to you by your health care provider. Make sure you discuss any questions you have with your health care provider. Document Released: 03/07/2012 Document Revised: 05/21/2016 Document Reviewed: 06/14/2013 Elsevier Interactive Patient Education  2017 [employer].

## 2017-12-03 NOTE — Telephone Encounter (Signed)
Referral placed at [facility] they will contact pt to schedule.

## 2017-12-03 NOTE — Telephone Encounter (Signed)
-----   Message from Princess Bruins, MD sent at 12/03/2017  1:17 PM EST ----- Regarding: Refer to cardiology Dizziness, general feeling of not being well, pain towards left arm.  Recent CT scan:  Advanced Atherosclerosis of Carotid Bifurcation Bilaterally.  Evaluation/management.

## 2017-12-03 NOTE — Progress Notes (Signed)
STEPHENIE Vega [birth year] [id number]   History:    83 y.o. [id number]  RP:  Established patient presenting for annual gyn exam and Pessary maintenance  HPI: Menopausal on no hormone replacement therapy.  No postmenopausal bleeding.  No current pelvic pain.  Has a Milex ring with support pessary which was malpositioned November 12 2017, after repositioning it patient has been without pelvic pain and with no problems passing bowel movements.  Using estrogen compound cream twice a week, but due to increase in cost would prefer to stop.  Not sexually active.  Breasts normal.  Past medical history,surgical history, family history and social history were all reviewed and documented in the EPIC chart.  Gynecologic History No LMP recorded. Patient is postmenopausal. Contraception: post menopausal status Last Pap: 2011.  Results were: normal Last mammogram: 2005. Results were: normal [place] 2017 Bone Density 2017  Obstetric History OB History  Gravida Para Term Preterm AB Living  5 3     2 3   SAB TAB Ectopic Multiple Live Births  1   1        # Outcome Date GA Lbr Len/2nd Weight Sex Delivery Anes PTL Lv  5 Ectopic           4 SAB           3 Para           2 Para           1 Para                ROS: A ROS was performed and pertinent positives and negatives are included in the history.  GENERAL: No fevers or chills. HEENT: No change in vision, no earache, sore throat or sinus congestion. NECK: No pain or stiffness. CARDIOVASCULAR: No chest pain or pressure. No palpitations. PULMONARY: No shortness of breath, cough or wheeze. GASTROINTESTINAL: No abdominal pain, nausea, vomiting or diarrhea, melena or bright red blood per rectum. GENITOURINARY: No urinary frequency, urgency, hesitancy or dysuria. MUSCULOSKELETAL: No joint or muscle pain, no back pain, no recent trauma. DERMATOLOGIC: No rash, no itching, no lesions. ENDOCRINE: No polyuria, polydipsia, no heat or cold intolerance. No  recent change in weight. HEMATOLOGICAL: No anemia or easy bruising or bleeding. NEUROLOGIC: No headache, seizures, numbness, tingling or weakness. PSYCHIATRIC: No depression, no loss of interest in normal activity or change in sleep pattern.     Exam:   BP 130/82   Ht 5\' 2"  (1.575 m)   Wt 147 lb (66.7 kg)   BMI 26.89 kg/m   Body mass index is 26.89 kg/m.  General appearance : Well developed well nourished female. No acute distress HEENT: Eyes: no retinal hemorrhage or exudates,  Neck supple, trachea midline, no carotid bruits, no thyroidmegaly Lungs: Clear to auscultation, no rhonchi or wheezes, or rib retractions  Heart: Regular rate and rhythm, no murmurs or gallops Breast:Examined in sitting and supine position were symmetrical in appearance, no palpable masses or tenderness,  no skin retraction, no nipple inversion, no nipple discharge, no skin discoloration, no axillary or supraclavicular lymphadenopathy Abdomen: no palpable masses or tenderness, no rebound or guarding Extremities: no edema or skin discoloration or tenderness  Pelvic: Vulva atrophy of menopause  Bartholin, Urethra, Skene Glands: Within normal limits             Vagina: No gross lesions or discharge.  Pessary removed easily.  Cleaned and reinserted.  Cervix: No gross lesions or discharge.  Pap reflex done.  Uterus  AV, normal size, shape and consistency, non-tender and mobile  Adnexa  Without masses or tenderness  Anus and perineum  normal     Assessment/Plan:  83 y.o. female for annual exam   1. Encounter for gynecological examination with abnormal finding Gynecologic exam with uterine prolapse and vulvovaginal atrophy of menopause.  Pap reflex done.  Breast exam normal.  Not doing screening mammograms anymore.  Last colonoscopy 2017.  Health labs with family physician.  2. Menopause present No HRT.  No PMB.  Vitamin D supplements and calcium in nutrition with weightbearing physical activity recommended.   Last bone density 2017.  3. Pessary maintenance Well with Milex ring with support pessary, good fit.  Cleaned and reinserted.  Vaginal mucosa intact.  Will d/c Estrogen compound cream and start Replens moisturizer.  4. Urinary frequency -U/A reflex U. Culture  5. Atherosclerosis of both carotid arteries Patient has many symptoms and does not feel well in general.  Complains of dizziness and pain towards the left arm.  Recent CT scan showed bilateral advanced atherosclerosis at the carotid bifurcation.  Refer to Cardiology.  Counseling on above issues more than 50% for 15 minutes.  Princess Bruins MD, 12:45 PM 12/03/2017

## 2017-12-03 NOTE — Addendum Note (Signed)
Addended by: Thurnell Garbe A on: 12/03/2017 03:25 PM   Modules accepted: Orders

## 2017-12-03 NOTE — Addendum Note (Signed)
Addended by: Thurnell Garbe A on: 12/03/2017 02:23 PM   Modules accepted: Orders

## 2017-12-03 NOTE — Addendum Note (Signed)
Addended by: Thurnell Garbe A on: 12/03/2017 03:29 PM   Modules accepted: Orders

## 2017-12-04 LAB — URINALYSIS W MICROSCOPIC + REFLEX CULTURE
Bilirubin Urine: NEGATIVE
Hyaline Cast: NONE SEEN /LPF
Ketones, ur: NEGATIVE
Leukocyte Esterase: NEGATIVE
Nitrites, Initial: NEGATIVE
Protein, ur: NEGATIVE
Specific Gravity, Urine: 1.015 (ref 1.001–1.03)
WBC, UA: NONE SEEN /HPF (ref 0–5)
pH: 5.5 (ref 5.0–8.0)

## 2017-12-04 LAB — URINE CULTURE
MICRO NUMBER:: 81379554
SPECIMEN QUALITY:: ADEQUATE

## 2017-12-04 LAB — NO CULTURE INDICATED

## 2017-12-07 LAB — PAP IG W/ RFLX HPV ASCU

## 2017-12-08 ENCOUNTER — Ambulatory Visit: Payer: Medicare Other | Admitting: Internal Medicine

## 2017-12-10 DIAGNOSIS — M79672 Pain in left foot: Secondary | ICD-10-CM | POA: Diagnosis not present

## 2017-12-10 DIAGNOSIS — M2022 Hallux rigidus, left foot: Secondary | ICD-10-CM | POA: Diagnosis not present

## 2017-12-10 DIAGNOSIS — M2021 Hallux rigidus, right foot: Secondary | ICD-10-CM | POA: Diagnosis not present

## 2017-12-10 DIAGNOSIS — M79671 Pain in right foot: Secondary | ICD-10-CM | POA: Diagnosis not present

## 2017-12-13 ENCOUNTER — Ambulatory Visit: Payer: Medicare Other | Admitting: Internal Medicine

## 2017-12-13 ENCOUNTER — Other Ambulatory Visit: Payer: Self-pay | Admitting: Emergency Medicine

## 2017-12-13 MED ORDER — SERTRALINE HCL 100 MG PO TABS: 100.0000 mg | ORAL_TABLET | Freq: Every day | ORAL | 1 refills | Status: DC

## 2017-12-14 ENCOUNTER — Ambulatory Visit (INDEPENDENT_AMBULATORY_CARE_PROVIDER_SITE_OTHER): Payer: Medicare Other | Admitting: Pulmonary Disease

## 2017-12-14 ENCOUNTER — Encounter: Payer: Self-pay | Admitting: Pulmonary Disease

## 2017-12-14 VITALS — BP 122/78 | HR 77 | Ht 63.5 in | Wt 147.8 lb

## 2017-12-14 DIAGNOSIS — I6523 Occlusion and stenosis of bilateral carotid arteries: Secondary | ICD-10-CM

## 2017-12-14 DIAGNOSIS — K219 Gastro-esophageal reflux disease without esophagitis: Secondary | ICD-10-CM

## 2017-12-14 DIAGNOSIS — R911 Solitary pulmonary nodule: Secondary | ICD-10-CM

## 2017-12-14 DIAGNOSIS — R0602 Shortness of breath: Secondary | ICD-10-CM | POA: Diagnosis not present

## 2017-12-14 DIAGNOSIS — J849 Interstitial pulmonary disease, unspecified: Secondary | ICD-10-CM

## 2017-12-14 NOTE — Telephone Encounter (Signed)
Pt scheduled on 12/16/17 with Dr.Harding, number given to reschedule if needed [phone]

## 2017-12-14 NOTE — Progress Notes (Signed)
Subjective:    Patient ID: Lisa Vega, female    DOB: Jun 06, 1932, 83 y.o.   MRN: [id number]  Synopsis: Former patient of Dr. Gwenette Greet with COPD in addition history of lung cancer status post right lower lobectomy in 2005.  Also has rheumatoid arthritis and has been on methotrexate since 1986.  Recurrent pneumonia in 2018.  She smoked from her 24's until the year 2005.     HPI Chief Complaint  Patient presents with  . Follow-up    Pt reports of chest tightness, sob with exertion & occ non prod cough mainly at night.   Rockie thinks that she is having to uses her emergency inhaler more lately.  She has been having a lot more heartburn lately despite taking prilosec in the morning and zantac at night.  She tries to avoid foods that make heartburn worse.  She avoids steak.  She has a gastroenterologist (Dr. Watt Climes), it's been a while since the last visit with him. In general her dyspnea and chest tightness have not improved.     Past Medical History:  Diagnosis Date  . Cancer ([place])    lung ca  . COPD (chronic obstructive pulmonary disease) ([place])   . Dyspnea   . GERD (gastroesophageal reflux disease)   . Hypercholesteremia   . Memory difficulties 10/28/2015  . Rheumatoid arthritis(714.0)   . Thyroid disease   . Tremor 10/28/2015   Jaw tremor  . Uterine prolapse   . Vitamin D deficiency        Review of Systems  Constitutional: Negative for chills, fatigue and fever.  HENT: Negative for postnasal drip, rhinorrhea and sinus pressure.   Respiratory: Positive for shortness of breath. Negative for cough and wheezing.   Cardiovascular: Negative for chest pain, palpitations and leg swelling.       Objective:   Physical Exam Vitals:   12/14/17 1052  BP: 122/78  Pulse: 77  SpO2: 96%  Weight: 147 lb 12.8 oz (67 kg)  Height: 5' 3.5" (1.613 m)    RA  Gen: well appearing HENT: OP clear, TM's clear, neck supple PULM: Crackles LUL B, normal percussion CV: RRR, no mgr, trace  edema GI: BS+, soft, nontender Derm: no cyanosis or rash Psyche: normal mood and affect   PFT PFT's 2013:  FEV1 1.49 (81%), ratio 60, TLC 86%, DLCO 65% (stable to better from 2007)  Chest imaging: CXR 12/2011:  No acute process. August 2018 CT chest: Right upper lobe pneumonia and bronchiectasis noted, faint nodule suggestive of hypersensitivity pneumonitis versus RBI LD, other nodules present status post right lower lobectomy, images independently reviewed  Other imaging: 07/2017 SLP eval: no aspiration 09/2017 Esophogram: IMPRESSION:, No laryngeal penetration, likely chronic reflux, mild presbyesophagus, no stricture a small reducible hiatal hernia no obstruction to liquid or barium tablet   2013 echocardiogram LVEF 50-55%, normal valvular dysfunction, abnormal septal motion consistent with bundle branch block  BMET    Component Value Date/Time   NA 137 11/01/2017 1237   NA 139 08/10/2011 1457   K 3.8 11/01/2017 1237   K 4.4 08/10/2011 1457   CL 99 11/01/2017 1237   CL 98 08/10/2011 1457   CO2 32 11/01/2017 1237   CO2 29 08/10/2011 1457   GLUCOSE 142 (H) 11/01/2017 1237   GLUCOSE 98 08/10/2011 1457   BUN 10 11/01/2017 1237   BUN 15 08/10/2011 1457   CREATININE 0.83 11/01/2017 1237   CREATININE 0.9 08/10/2011 1457   CALCIUM 9.7 11/01/2017 1237  CALCIUM 8.9 08/10/2011 1457   GFRNONAA 66 (L) 01/15/2014 1817   GFRAA 77 (L) 01/15/2014 1817        Assessment & Plan:    SOB (shortness of breath) - Plan: Pulmonary function test  Shortness of breath - Plan: CT Chest High Resolution, CANCELED: CT Chest High Resolution  Interstitial pulmonary disease (HCC) - Plan: CT Chest High Resolution  Solitary pulmonary nodule - Plan: CT Chest High Resolution  Gastroesophageal reflux disease without esophagitis  Discussion: Unfortunately Peola has not been feeling better since the last visit.  I was hoping that the further she got away from methotrexate the better she was going to  feel because she had a centrilobular nodule pattern which was suggestive of an abnormal response to methotrexate.  Unfortunately her symptoms have not improved.  Because of all the acid reflux she has been having I do wonder whether or not this is contributing to worsening control of her COPD.  At this point to assess further we need to repeat a lung function test and high-resolution CT scan of the chest to see if there is a pulmonary parenchymal abnormality as this would suggest an inflammatory condition or interstitial lung disease.  If that is what we see then we will need to do a bronchoscopy to assess further.  However, if the CT scan of her chest and PFT showed no significant underlying inflammatory or fibrotic disease then the next step will be to figure out how to better control her gastroesophageal reflux disease.  I do wonder whether or not she would benefit from a Nissen.  Plan: Shortness of breath: It is not clear to me if this is because of worsening reflux making your COPD worse or if you have scarring in your lungs as seen on the most recent CT scan. To evaluate further were going to get another CT scan and lung function test If these show evidence of worsening scarring then wean endoscopy evaluate further However, if there is no obvious scarring or inflammation in your lungs then the next step is going to be to try to figure out how to manage the acid reflux better  COPD: Continue Symbicort as you are doing  Gastroesophageal reflux disease: Continue omeprazole burning and Zantac at night  Follow-up visit in 4-6 weeks    Current Outpatient Medications:  .  acetaminophen (TYLENOL) 325 MG tablet, Take 650 mg by mouth every 6 (six) hours as needed., Disp: , Rfl:  .  ALPRAZolam (XANAX) 0.25 MG tablet, Take 1 tablet (0.25 mg total) by mouth daily as needed for anxiety., Disp: 20 tablet, Rfl: 1 .  budesonide-formoterol (SYMBICORT) 160-4.5 MCG/ACT inhaler, 2 puffs BID, Disp: 3  Inhaler, Rfl: 3 .  Cholecalciferol (VITAMIN D) 2000 units CAPS, Take 2,000 Units by mouth daily., Disp: , Rfl:  .  folic acid (FOLVITE) 1 MG tablet, Take 1 mg by mouth daily., Disp: , Rfl:  .  gabapentin (NEURONTIN) 100 MG capsule, One capsule three times a day, Disp: 270 capsule, Rfl: 3 .  glucose blood (FREESTYLE LITE) test strip, Use as instructed once daily to check sugars, prediabetes, Disp: 100 each, Rfl: 3 .  loratadine (CLARITIN) 10 MG tablet, Take 10 mg by mouth daily., Disp: , Rfl:  .  NONFORMULARY OR COMPOUNDED ITEM, Estradiol .02% 1 ML Prefilled Applicator Sig: apply vaginally twice a week #90 Day Supply with 4 refills, Disp: 1 each, Rfl: 0 .  omeprazole (PRILOSEC) 40 MG capsule, Take 1 capsule (40 mg total) by  mouth daily., Disp: 90 capsule, Rfl: 3 .  potassium chloride SA (K-DUR,KLOR-CON) 20 MEQ tablet, Take 1 tablet (20 mEq total) by mouth daily., Disp: 30 tablet, Rfl: 2 .  PROAIR HFA 108 (90 Base) MCG/ACT inhaler, USE 1-2 PUFFS EVERY 4-6 HOURS AS NEEDED, Disp: 8.5 g, Rfl: 1 .  ranitidine (ZANTAC) 150 MG tablet, TAKE 1 TABLET BY MOUTH EVERY EVENING AT BEDTIME AS NEEDED FOR HEARTBURN, Disp: 90 tablet, Rfl: 1 .  sertraline (ZOLOFT) 100 MG tablet, Take 1 tablet (100 mg total) by mouth daily., Disp: 90 tablet, Rfl: 1 .  SYNTHROID 88 MCG tablet, TAKE 1 TABLET DAILY, Disp: 90 tablet, Rfl: 0 .  SYNTHROID 88 MCG tablet, TAKE 1 TABLET DAILY, Disp: 90 tablet, Rfl: 1 .  triamcinolone cream (KENALOG) 0.1 %, Apply 1 application daily as needed topically. Thin layer on vulva as needed, Disp: 30 g, Rfl: 3

## 2017-12-14 NOTE — Patient Instructions (Addendum)
Shortness of breath: It is not clear to me if this is because of worsening reflux making your COPD worse or if you have scarring in your lungs as seen on the most recent CT scan. To evaluate further were going to get another CT scan and lung function test If these show evidence of worsening scarring then wean endoscopy evaluate further However, if there is no obvious scarring or inflammation in your lungs then the next step is going to be to try to figure out how to manage the acid reflux better  COPD: Continue Symbicort as you are doing  Gastroesophageal reflux disease: Continue omeprazole burning and Zantac at night  Follow-up visit in 4-6 weeks

## 2017-12-15 ENCOUNTER — Telehealth: Payer: Self-pay | Admitting: Internal Medicine

## 2017-12-15 MED ORDER — SERTRALINE HCL 100 MG PO TABS: 100.0000 mg | ORAL_TABLET | Freq: Every day | ORAL | 1 refills | Status: DC

## 2017-12-15 NOTE — Telephone Encounter (Signed)
Lisa Vega  At  [facility]   No   Rx   On  Record  As  Being  Received   For   Sertaline

## 2017-12-15 NOTE — Telephone Encounter (Unsigned)
Copied from [place] [id number]. Topic: General - Other >> Dec 15, 2017  9:03 AM Neva Seat wrote:  Alyse Low w/ [facility] - in [place]  [phone]  Christy called to give a Verbal/New Rx - Sertraline 50mg  -1 a day

## 2017-12-15 NOTE — Telephone Encounter (Signed)
Resent RX to [facility]

## 2017-12-15 NOTE — Telephone Encounter (Signed)
Copied from [place] #[id number]. >> Dec 15, 2017  9:03 AM Neva Seat wrote:  Alyse Low w/ [facility] - in [place]  [phone]  Alyse Low called to give a Verbal/New Rx - Sertraline 50mg  -1 a day

## 2017-12-16 ENCOUNTER — Ambulatory Visit: Payer: Medicare Other | Admitting: Cardiology

## 2017-12-22 ENCOUNTER — Ambulatory Visit (HOSPITAL_COMMUNITY)
Admission: RE | Admit: 2017-12-22 | Discharge: 2017-12-22 | Disposition: A | Discharge status: Home / Self Care | Payer: Medicare Other | Source: Ambulatory Visit | Attending: Pulmonary Disease | Admitting: Pulmonary Disease

## 2017-12-22 DIAGNOSIS — I700 Atherosclerosis of aorta: Secondary | ICD-10-CM | POA: Insufficient documentation

## 2017-12-22 DIAGNOSIS — R918 Other nonspecific abnormal finding of lung field: Secondary | ICD-10-CM | POA: Insufficient documentation

## 2017-12-22 DIAGNOSIS — I2510 Atherosclerotic heart disease of native coronary artery without angina pectoris: Secondary | ICD-10-CM | POA: Insufficient documentation

## 2017-12-22 DIAGNOSIS — Z902 Acquired absence of lung [part of]: Secondary | ICD-10-CM | POA: Insufficient documentation

## 2017-12-22 DIAGNOSIS — R0602 Shortness of breath: Secondary | ICD-10-CM | POA: Diagnosis not present

## 2017-12-22 DIAGNOSIS — J849 Interstitial pulmonary disease, unspecified: Secondary | ICD-10-CM

## 2017-12-22 DIAGNOSIS — R911 Solitary pulmonary nodule: Secondary | ICD-10-CM

## 2017-12-22 DIAGNOSIS — J479 Bronchiectasis, uncomplicated: Secondary | ICD-10-CM | POA: Diagnosis not present

## 2017-12-24 ENCOUNTER — Telehealth: Payer: Self-pay | Admitting: *Deleted

## 2017-12-24 NOTE — Telephone Encounter (Signed)
I agree. Unless there is a concern for cancer recurrence, there is no need to see her.

## 2017-12-24 NOTE — Telephone Encounter (Signed)
"  I'd really Love to see Dr. Julien Nordmann.  He was so good to me but my breathing is under control.  After CT, everything looks good.  I'll call back if needed.  I do not want to take an appointment away from someone who really needs one at this time."

## 2017-12-27 ENCOUNTER — Telehealth: Payer: Self-pay | Admitting: Medical Oncology

## 2017-12-27 NOTE — Telephone Encounter (Signed)
Pt notified and appt cancelled. She will call us back if she wants an appt. Based on recent Ct scan per Dr Lake Bells.

## 2017-12-27 NOTE — Telephone Encounter (Signed)
Per Julien Nordmann" I  agree. Unless there is a concern for cancer recurrence, there is no need to see her."

## 2017-12-29 DIAGNOSIS — Z79899 Other long term (current) drug therapy: Secondary | ICD-10-CM | POA: Diagnosis not present

## 2017-12-29 DIAGNOSIS — M0579 Rheumatoid arthritis with rheumatoid factor of multiple sites without organ or systems involvement: Secondary | ICD-10-CM | POA: Diagnosis not present

## 2017-12-30 ENCOUNTER — Telehealth: Payer: Self-pay | Admitting: Pulmonary Disease

## 2017-12-30 MED ORDER — BUDESONIDE-FORMOTEROL FUMARATE 160-4.5 MCG/ACT IN AERO: INHALATION_SPRAY | RESPIRATORY_TRACT | 0 refills | Status: DC

## 2017-12-30 NOTE — Telephone Encounter (Signed)
I sent in one rx for vacation override to the pharmacy in [place]. I spoke with pt;s daughter and advised her that this was done. Nothing further is needed.

## 2017-12-30 NOTE — Telephone Encounter (Signed)
Pt is calling back [phone]

## 2017-12-30 NOTE — Telephone Encounter (Signed)
Spoke with pt's daughter- Lisa Vega advised they are traveling from [place] to [place] today for next three days due to death in the family.  Pt forgot [id number] at home in [place], [place] and they are requesting BQ to send new script of symbicort 160 [facility] [place], [place] for the [facility].  [facility] [place] phone is [phone]  Katharine Look did advise that the pt has rescue inhaler if needed. Advised her that we are sending message to BQ and will advise her of his recommendations today.  BQ please advise recommendation for this situation.

## 2018-01-10 ENCOUNTER — Ambulatory Visit: Payer: Medicare Other | Admitting: Internal Medicine

## 2018-01-12 DIAGNOSIS — R1310 Dysphagia, unspecified: Secondary | ICD-10-CM | POA: Diagnosis not present

## 2018-01-12 DIAGNOSIS — K5901 Slow transit constipation: Secondary | ICD-10-CM | POA: Diagnosis not present

## 2018-01-18 DIAGNOSIS — E663 Overweight: Secondary | ICD-10-CM | POA: Diagnosis not present

## 2018-01-18 DIAGNOSIS — M2550 Pain in unspecified joint: Secondary | ICD-10-CM | POA: Diagnosis not present

## 2018-01-18 DIAGNOSIS — Z6825 Body mass index (BMI) 25.0-25.9, adult: Secondary | ICD-10-CM | POA: Diagnosis not present

## 2018-01-18 DIAGNOSIS — Z79899 Other long term (current) drug therapy: Secondary | ICD-10-CM | POA: Diagnosis not present

## 2018-01-18 DIAGNOSIS — M150 Primary generalized (osteo)arthritis: Secondary | ICD-10-CM | POA: Diagnosis not present

## 2018-01-18 DIAGNOSIS — R5382 Chronic fatigue, unspecified: Secondary | ICD-10-CM | POA: Diagnosis not present

## 2018-01-18 DIAGNOSIS — M0579 Rheumatoid arthritis with rheumatoid factor of multiple sites without organ or systems involvement: Secondary | ICD-10-CM | POA: Diagnosis not present

## 2018-01-24 ENCOUNTER — Ambulatory Visit (INDEPENDENT_AMBULATORY_CARE_PROVIDER_SITE_OTHER): Payer: Medicare Other | Admitting: Pulmonary Disease

## 2018-01-24 ENCOUNTER — Ambulatory Visit: Payer: Medicare Other | Admitting: Cardiology

## 2018-01-24 ENCOUNTER — Encounter: Payer: Self-pay | Admitting: Pulmonary Disease

## 2018-01-24 VITALS — BP 128/70 | HR 81 | Ht 63.0 in | Wt 146.0 lb

## 2018-01-24 DIAGNOSIS — K219 Gastro-esophageal reflux disease without esophagitis: Secondary | ICD-10-CM

## 2018-01-24 DIAGNOSIS — R0602 Shortness of breath: Secondary | ICD-10-CM

## 2018-01-24 DIAGNOSIS — J479 Bronchiectasis, uncomplicated: Secondary | ICD-10-CM

## 2018-01-24 DIAGNOSIS — J849 Interstitial pulmonary disease, unspecified: Secondary | ICD-10-CM

## 2018-01-24 DIAGNOSIS — J449 Chronic obstructive pulmonary disease, unspecified: Secondary | ICD-10-CM | POA: Diagnosis not present

## 2018-01-24 LAB — PULMONARY FUNCTION TEST
DL/VA % pred: 70 %
DL/VA: 3.3 ml/min/mmHg/L
DLCO cor % pred: 49 %
DLCO cor: 11.38 ml/min/mmHg
DLCO unc % pred: 50 %
DLCO unc: 11.52 ml/min/mmHg
FEF 25-75 Post: 0.79 L/sec
FEF 25-75 Pre: 0.53 L/sec
FEF2575-%Change-Post: 47 %
FEF2575-%Pred-Post: 66 %
FEF2575-%Pred-Pre: 45 %
FEV1-%Change-Post: 12 %
FEV1-%Pred-Post: 78 %
FEV1-%Pred-Pre: 69 %
FEV1-Post: 1.36 L
FEV1-Pre: 1.2 L
FEV1FVC-%Change-Post: 6 %
FEV1FVC-%Pred-Pre: 81 %
FEV6-%Change-Post: 7 %
FEV6-%Pred-Post: 96 %
FEV6-%Pred-Pre: 90 %
FEV6-Post: 2.13 L
FEV6-Pre: 1.99 L
FEV6FVC-%Change-Post: 0 %
FEV6FVC-%Pred-Post: 105 %
FEV6FVC-%Pred-Pre: 104 %
FVC-%Change-Post: 6 %
FVC-%Pred-Post: 91 %
FVC-%Pred-Pre: 86 %
FVC-Post: 2.15 L
FVC-Pre: 2.02 L
Post FEV1/FVC ratio: 63 %
Post FEV6/FVC ratio: 99 %
Pre FEV1/FVC ratio: 60 %
Pre FEV6/FVC Ratio: 98 %
RV % pred: 101 %
RV: 2.45 L
TLC % pred: 92 %
TLC: 4.51 L

## 2018-01-24 NOTE — Patient Instructions (Signed)
COPD: Continue Symbicort twice a day Continue albuterol as needed Follow-up 6 months  Mild scarring in your lungs: I think this is due to an old infection but is nothing to be concerned about right now We will get another lung function test in 6 months to make sure it is not worse  We will see you back in 6 months or sooner if needed

## 2018-01-24 NOTE — Progress Notes (Signed)
Subjective:    Patient ID: Lisa Vega, female    DOB: 07/13/1932, 84 y.o.   MRN: [id number]  Synopsis: Former patient of Dr. Gwenette Vega with COPD in addition history of lung cancer status post right lower lobectomy in 2005.  Also has rheumatoid arthritis and has been on methotrexate since 1986.  Recurrent pneumonia in 2018.  She smoked from her 7's until the year 2005.     HPI Chief Complaint  Patient presents with  . Follow-up    review PFT.  pt c/o stable sob with exertion.     Lisa Vega continues to have hoarseness and headache. She remains concenred about the fatty collection in her neck.  She says that her breathing is "pretty good" unless she drags her trachcan up the driveway.  Sometimes while just sitting she will have a little shortness of breath.  She has been using her proair inhaler a little more often.  She has to clear her throat ton.    GERD: She takes prilosec in the afternoons and Zantac at night.  She only takes that Zantac as needed for indigestion.  She still has some periodic heartburn.  She went back to Dr. Watt Vega recently.     Past Medical History:  Diagnosis Date  . Cancer ([place])    lung ca  . COPD (chronic obstructive pulmonary disease) ([place])   . Dyspnea   . GERD (gastroesophageal reflux disease)   . Hypercholesteremia   . Memory difficulties 10/28/2015  . Rheumatoid arthritis(714.0)   . Thyroid disease   . Tremor 10/28/2015   Jaw tremor  . Uterine prolapse   . Vitamin D deficiency        Review of Systems  Constitutional: Negative for chills, fatigue and fever.  HENT: Negative for postnasal drip, rhinorrhea and sinus pressure.   Respiratory: Positive for shortness of breath. Negative for cough and wheezing.   Cardiovascular: Negative for chest pain, palpitations and leg swelling.       Objective:   Physical Exam Vitals:   01/24/18 1159  BP: 128/70  Pulse: 81  SpO2: 94%  Weight: 146 lb (66.2 kg)  Height: 5\' 3"  (1.6 m)    RA  Gen:  chronically ill appearing HENT: OP clear, TM's clear, neck supple PULM: CTA B, normal percussion CV: RRR, no mgr, trace edema GI: BS+, soft, nontender MSK: chronic arthritis changes hands Psyche: normal mood and affect    PFT PFT's 2013:  FEV1 1.49 (81%), ratio 60, TLC 86%, DLCO 65% (stable to better from 2007) January 2019 ratio 63%, FEV1 1.36 L 78% predicted, FVC 2.15 L 91% predicted, total lung capacity 4.51 L 92% predicted, DLCO 11.5 50% predicted  Chest imaging: CXR 12/2011:  No acute process. August 2018 CT chest: Right upper lobe pneumonia and bronchiectasis noted, faint nodule suggestive of hypersensitivity pneumonitis versus RBI LD, other nodules present status post right lower lobectomy, images independently reviewed December 2018 high-resolution CT scan of the chest images independently reviewed  Other imaging: 07/2017 SLP eval: no aspiration 09/2017 Esophogram: IMPRESSION:, No laryngeal penetration, likely chronic reflux, mild presbyesophagus, no stricture a small reducible hiatal hernia no obstruction to liquid or barium tablet   2013 echocardiogram LVEF 50-55%, normal valvular dysfunction, abnormal septal motion consistent with bundle branch block  BMET    Component Value Date/Time   NA 137 11/01/2017 1237   NA 139 08/10/2011 1457   K 3.8 11/01/2017 1237   K 4.4 08/10/2011 1457   CL 99 11/01/2017 1237  CL 98 08/10/2011 1457   CO2 32 11/01/2017 1237   CO2 29 08/10/2011 1457   GLUCOSE 142 (H) 11/01/2017 1237   GLUCOSE 98 08/10/2011 1457   BUN 10 11/01/2017 1237   BUN 15 08/10/2011 1457   CREATININE 0.83 11/01/2017 1237   CREATININE 0.9 08/10/2011 1457   CALCIUM 9.7 11/01/2017 1237   CALCIUM 8.9 08/10/2011 1457   GFRNONAA 66 (L) 01/15/2014 1817   GFRAA 77 (L) 01/15/2014 1817        Assessment & Plan:    ILD (interstitial lung disease) ([place]) - Plan: Pulmonary Function Test  Gastroesophageal reflux disease without esophagitis  COPD with chronic  bronchitis and emphysema (HCC)  Bronchiectasis without complication ([place])  Discussion: Pleased that the CT scan of her chest did not show any sort of interstitial lung disease, there is some mild bronchiectasis and postinflammatory type changes along with emphysema.  She has some nondescript inflammatory scarring but nothing to suggest an underlying interstitial disease.  I think it is likely aspiration related given her GERD and presbyesophagus.   I believe that the findings on CT chest from August 2018 were related to methotrexate use.  She should stay away from that medicine.    Her COPD has been stable.  I do question whether or not she could have dementia because she asked the same question frequently and did not recall details from very recent visits from other physicians.  Plan: COPD: Continue Symbicort twice a day Continue albuterol as needed Follow-up 6 months  Mild scarring in your lungs: I think this is due to an old infection but is nothing to be concerned about right now We will get another lung function test in 6 months to make sure it is not worse  We will see you back in 6 months or sooner if needed  > 50% of this 27 minute visit spent face to face   Current Outpatient Medications:  .  acetaminophen (TYLENOL) 325 MG tablet, Take 650 mg by mouth every 6 (six) hours as needed., Disp: , Rfl:  .  ALPRAZolam (XANAX) 0.25 MG tablet, Take 1 tablet (0.25 mg total) by mouth daily as needed for anxiety., Disp: 20 tablet, Rfl: 1 .  budesonide-formoterol (SYMBICORT) 160-4.5 MCG/ACT inhaler, 2 puffs BID, Disp: 1 Inhaler, Rfl: 0 .  Cholecalciferol (VITAMIN D) 2000 units CAPS, Take 2,000 Units by mouth daily., Disp: , Rfl:  .  folic acid (FOLVITE) 1 MG tablet, Take 1 mg by mouth daily., Disp: , Rfl:  .  gabapentin (NEURONTIN) 100 MG capsule, One capsule three times a day, Disp: 270 capsule, Rfl: 3 .  glucose blood (FREESTYLE LITE) test strip, Use as instructed once daily to check  sugars, prediabetes, Disp: 100 each, Rfl: 3 .  leflunomide (ARAVA) 10 MG tablet, Take 10 mg by mouth daily., Disp: , Rfl:  .  loratadine (CLARITIN) 10 MG tablet, Take 10 mg by mouth daily., Disp: , Rfl:  .  NONFORMULARY OR COMPOUNDED ITEM, Estradiol .02% 1 ML Prefilled Applicator Sig: apply vaginally twice a week #90 Day Supply with 4 refills, Disp: 1 each, Rfl: 0 .  omeprazole (PRILOSEC) 40 MG capsule, Take 1 capsule (40 mg total) by mouth daily., Disp: 90 capsule, Rfl: 3 .  potassium chloride SA (K-DUR,KLOR-CON) 20 MEQ tablet, Take 1 tablet (20 mEq total) by mouth daily., Disp: 30 tablet, Rfl: 2 .  PROAIR HFA 108 (90 Base) MCG/ACT inhaler, USE 1-2 PUFFS EVERY 4-6 HOURS AS NEEDED, Disp: 8.5 g, Rfl: 1 .  ranitidine (ZANTAC) 150 MG tablet, TAKE 1 TABLET BY MOUTH EVERY EVENING AT BEDTIME AS NEEDED FOR HEARTBURN, Disp: 90 tablet, Rfl: 1 .  sertraline (ZOLOFT) 100 MG tablet, Take 1 tablet (100 mg total) by mouth daily., Disp: 90 tablet, Rfl: 1 .  SYNTHROID 88 MCG tablet, TAKE 1 TABLET DAILY, Disp: 90 tablet, Rfl: 0 .  SYNTHROID 88 MCG tablet, TAKE 1 TABLET DAILY, Disp: 90 tablet, Rfl: 1 .  triamcinolone cream (KENALOG) 0.1 %, Apply 1 application daily as needed topically. Thin layer on vulva as needed, Disp: 30 g, Rfl: 3

## 2018-01-24 NOTE — Progress Notes (Signed)
PFT done today. 

## 2018-01-25 ENCOUNTER — Encounter: Payer: Self-pay | Admitting: Cardiovascular Disease

## 2018-01-25 ENCOUNTER — Ambulatory Visit (INDEPENDENT_AMBULATORY_CARE_PROVIDER_SITE_OTHER): Payer: Medicare Other | Admitting: Cardiovascular Disease

## 2018-01-25 VITALS — BP 160/72 | HR 66 | Ht 65.0 in | Wt 146.0 lb

## 2018-01-25 DIAGNOSIS — R0609 Other forms of dyspnea: Secondary | ICD-10-CM | POA: Diagnosis not present

## 2018-01-25 DIAGNOSIS — R0600 Dyspnea, unspecified: Secondary | ICD-10-CM

## 2018-01-25 DIAGNOSIS — I739 Peripheral vascular disease, unspecified: Principal | ICD-10-CM

## 2018-01-25 DIAGNOSIS — I779 Disorder of arteries and arterioles, unspecified: Secondary | ICD-10-CM | POA: Diagnosis not present

## 2018-01-25 DIAGNOSIS — R079 Chest pain, unspecified: Secondary | ICD-10-CM | POA: Diagnosis not present

## 2018-01-25 DIAGNOSIS — R9431 Abnormal electrocardiogram [ECG] [EKG]: Secondary | ICD-10-CM | POA: Diagnosis not present

## 2018-01-25 MED ORDER — ASPIRIN EC 81 MG PO TBEC
81.0000 mg | DELAYED_RELEASE_TABLET | Freq: Every day | ORAL | 3 refills | Status: DC
Start: 2018-01-25 — End: 2018-05-02

## 2018-01-25 NOTE — Patient Instructions (Signed)
Medication Instructions: START Aspirin 81 mg tablet daily  If you need a refill on your cardiac medications before your next appointment, please call your pharmacy.   Procedures/Testing: Your physician has requested that you have a carotid duplex. This test is an ultrasound of the carotid arteries in your neck. It looks at blood flow through these arteries that supply the brain with blood. Allow one hour for this exam. There are no restrictions or special instructions. This will be done at [place], [address].  Your physician has requested that you have a lexiscan myoview. For further information please visit [email or web address]. Please follow instruction sheet, as given.  This will be done at [place], [address].   Your physician has requested that you have an echocardiogram. Echocardiography is a painless test that uses sound waves to create images of your heart. It provides your doctor with information about the size and shape of your heart and how well your heart's chambers and valves are working. This procedure takes approximately one hour. There are no restrictions for this procedure.  This will be done at [place], [address].   Follow-Up: Your physician wants you to follow-up in: one month with Dr. Fletcher Anon.   Thank you for choosing Heartcare at [facility]!!

## 2018-01-25 NOTE — Progress Notes (Signed)
Cardiology Office Note   Date:  01/25/2018   ID:  Lisa Vega, DOB 1932-01-29, MRN [id number]  PCP:  Binnie Rail, MD  Cardiologist:   Kathlyn Sacramento, MD   Chief Complaint  Patient presents with  . Follow-up    refered by by Dr [place] Bells for carotid disease      History of Present Illness: Lisa Vega is a 84 y.o. female who was referred for evaluation of bilateral carotid artery atherosclerosis noted on CT scan. She has known history of COPD, lung cancer status post right lower lobe lobectomy in 2005, rheumatoid arthritis and previous tobacco use. CT scan of the neck in November showed advanced atherosclerosis affecting both carotid bifurcations. She has no prior cardiac history.  She is noted to have left bundle branch block today which seems to be new.  She reports intermittent episodes of substernal chest tightness described as a band around her chest which happens at rest and last for a few minutes.  She is not physically very active.  She also has significant exertional dyspnea which she attributes to her lung disease.  She recently sold her house and moved to live with her daughter.  She was taken off methotrexate recently due to possible effect on her lungs. She reports prolonged episodes of dizziness described as lightheadedness when she stands up.  She also has some spinning sensation when she lies down on the right side.  This improves in certain positions. She is a previous smoker. She has family history of coronary artery disease.  Both brother and sister died of coronary artery disease.   Past Medical History:  Diagnosis Date  . Cancer ([place])    lung ca  . COPD (chronic obstructive pulmonary disease) ([place])   . Dyspnea   . GERD (gastroesophageal reflux disease)   . Hypercholesteremia   . Memory difficulties 10/28/2015  . Rheumatoid arthritis(714.0)   . Thyroid disease   . Tremor 10/28/2015   Jaw tremor  . Uterine prolapse   . Vitamin D deficiency      Past Surgical History:  Procedure Laterality Date  . APPENDECTOMY    . HEMORRHOID SURGERY    . right lobectomy  10/2004  . TONSILLECTOMY AND ADENOIDECTOMY       Current Outpatient Medications  Medication Sig Dispense Refill  . acetaminophen (TYLENOL) 325 MG tablet Take 650 mg by mouth every 6 (six) hours as needed.    . ALPRAZolam (XANAX) 0.25 MG tablet Take 1 tablet (0.25 mg total) by mouth daily as needed for anxiety. 20 tablet 1  . budesonide-formoterol (SYMBICORT) 160-4.5 MCG/ACT inhaler 2 puffs BID 1 Inhaler 0  . Cholecalciferol (VITAMIN D) 2000 units CAPS Take 2,000 Units by mouth daily.    . folic acid (FOLVITE) 1 MG tablet Take 1 mg by mouth daily.    Marland Kitchen gabapentin (NEURONTIN) 100 MG capsule One capsule three times a day 270 capsule 3  . glucose blood (FREESTYLE LITE) test strip Use as instructed once daily to check sugars, prediabetes 100 each 3  . leflunomide (ARAVA) 10 MG tablet Take 10 mg by mouth daily.    Marland Kitchen loratadine (CLARITIN) 10 MG tablet Take 10 mg by mouth daily.    . NONFORMULARY OR COMPOUNDED ITEM Estradiol .02% 1 ML Prefilled Applicator Sig: apply vaginally twice a week #90 Day Supply with 4 refills 1 each 0  . omeprazole (PRILOSEC) 40 MG capsule Take 1 capsule (40 mg total) by mouth daily. 90 capsule 3  .  potassium chloride SA (K-DUR,KLOR-CON) 20 MEQ tablet Take 1 tablet (20 mEq total) by mouth daily. 30 tablet 2  . PROAIR HFA 108 (90 Base) MCG/ACT inhaler USE 1-2 PUFFS EVERY 4-6 HOURS AS NEEDED 8.5 g 1  . ranitidine (ZANTAC) 150 MG tablet TAKE 1 TABLET BY MOUTH EVERY EVENING AT BEDTIME AS NEEDED FOR HEARTBURN 90 tablet 1  . sertraline (ZOLOFT) 100 MG tablet Take 1 tablet (100 mg total) by mouth daily. 90 tablet 1  . SYNTHROID 88 MCG tablet TAKE 1 TABLET DAILY 90 tablet 0  . SYNTHROID 88 MCG tablet TAKE 1 TABLET DAILY 90 tablet 1  . triamcinolone cream (KENALOG) 0.1 % Apply 1 application daily as needed topically. Thin layer on vulva as needed 30 g 3   No  current facility-administered medications for this visit.     Allergies:   Aspirin; Codeine; Demerol; and Meperidine hcl    Social History:  The patient  reports that she quit smoking about 13 years ago. Her smoking use included cigarettes. She has a 20.00 pack-year smoking history. she has never used smokeless tobacco. She reports that she drinks about 0.6 oz of alcohol per week. She reports that she does not use drugs.   Family History:  The patient's family history includes Asthma in her mother; Bone cancer in her father; Emphysema in her mother; Heart disease in her brother and sister; Lung cancer in her father; Pancreatic cancer in her father; Stroke in her maternal grandfather.    ROS:  Please see the history of present illness.   Otherwise, review of systems are positive for none.   All other systems are reviewed and negative.    PHYSICAL EXAM: VS:  BP (!) 160/72   Pulse 66   Ht 5\' 5"  (1.651 m)   Wt 146 lb (66.2 kg)   BMI 24.30 kg/m  , BMI Body mass index is 24.3 kg/m. GEN: Well nourished, well developed, in no acute distress  HEENT: normal  Neck: no JVD, carotid bruits, or masses Cardiac: RRR; no murmurs, rubs, or gallops,no edema  Respiratory:  clear to auscultation bilaterally, normal work of breathing GI: soft, nontender, nondistended, + BS MS: no deformity or atrophy  Skin: warm and dry, no rash Neuro:  Strength and sensation are intact Psych: euthymic mood, full affect   EKG:  EKG is ordered today. The ekg ordered today demonstrates normal sinus rhythm with PVCs and left bundle branch block.   Recent Labs: 08/06/2017: Pro B Natriuretic peptide (BNP) 163.0 09/03/2017: Hemoglobin 12.7; Platelets 234.0 09/22/2017: ALT 10; TSH 1.40 11/01/2017: BUN 10; Creatinine, Ser 0.83; Potassium 3.8; Sodium 137    Lipid Panel No results found for: CHOL, TRIG, HDL, CHOLHDL, VLDL, LDLCALC, LDLDIRECT    Wt Readings from Last 3 Encounters:  01/25/18 146 lb (66.2 kg)  01/24/18  146 lb (66.2 kg)  12/14/17 147 lb 12.8 oz (67 kg)       No flowsheet data found.    ASSESSMENT AND PLAN:  1.  Bilateral carotid disease: Extensive atherosclerosis noted on previous CT scan.  No previous history of stroke.  I requested carotid Doppler.  Recommend starting aspirin 81 mg once daily.  She is not truly allergic to this medication.  2.  Chest pain and exertional dyspnea: EKG shows left bundle branch block which seems to be new.  I requested a pharmacologic nuclear stress test and an echocardiogram for evaluation.  3.  Dizziness: Some of her symptoms are suggestive of orthostatic hypotension but she  also has an element of what seems to be benign positional vertigo.    Disposition:   FU with me in 1 month  Signed,  Kathlyn Sacramento, MD  01/25/2018 9:46 AM    [place]

## 2018-02-01 ENCOUNTER — Telehealth (HOSPITAL_COMMUNITY): Payer: Self-pay

## 2018-02-01 NOTE — Telephone Encounter (Signed)
Encounter complete. 

## 2018-02-03 ENCOUNTER — Ambulatory Visit (HOSPITAL_COMMUNITY)
Admission: RE | Admit: 2018-02-03 | Discharge: 2018-02-03 | Disposition: A | Discharge status: Home / Self Care | Payer: Medicare Other | Source: Ambulatory Visit | Attending: Cardiovascular Disease | Admitting: Cardiovascular Disease

## 2018-02-03 DIAGNOSIS — I519 Heart disease, unspecified: Secondary | ICD-10-CM | POA: Diagnosis not present

## 2018-02-03 DIAGNOSIS — I6523 Occlusion and stenosis of bilateral carotid arteries: Secondary | ICD-10-CM | POA: Insufficient documentation

## 2018-02-03 DIAGNOSIS — J449 Chronic obstructive pulmonary disease, unspecified: Secondary | ICD-10-CM | POA: Insufficient documentation

## 2018-02-03 DIAGNOSIS — E119 Type 2 diabetes mellitus without complications: Secondary | ICD-10-CM | POA: Diagnosis not present

## 2018-02-03 DIAGNOSIS — I779 Disorder of arteries and arterioles, unspecified: Secondary | ICD-10-CM | POA: Diagnosis not present

## 2018-02-03 DIAGNOSIS — E785 Hyperlipidemia, unspecified: Secondary | ICD-10-CM | POA: Diagnosis not present

## 2018-02-03 DIAGNOSIS — Z87891 Personal history of nicotine dependence: Secondary | ICD-10-CM | POA: Insufficient documentation

## 2018-02-03 DIAGNOSIS — E079 Disorder of thyroid, unspecified: Secondary | ICD-10-CM | POA: Insufficient documentation

## 2018-02-03 DIAGNOSIS — R5383 Other fatigue: Secondary | ICD-10-CM | POA: Insufficient documentation

## 2018-02-03 DIAGNOSIS — R0609 Other forms of dyspnea: Secondary | ICD-10-CM

## 2018-02-03 DIAGNOSIS — I739 Peripheral vascular disease, unspecified: Secondary | ICD-10-CM

## 2018-02-03 DIAGNOSIS — I447 Left bundle-branch block, unspecified: Secondary | ICD-10-CM | POA: Diagnosis not present

## 2018-02-03 DIAGNOSIS — R079 Chest pain, unspecified: Secondary | ICD-10-CM

## 2018-02-03 DIAGNOSIS — Z8249 Family history of ischemic heart disease and other diseases of the circulatory system: Secondary | ICD-10-CM | POA: Insufficient documentation

## 2018-02-03 DIAGNOSIS — Z85118 Personal history of other malignant neoplasm of bronchus and lung: Secondary | ICD-10-CM | POA: Insufficient documentation

## 2018-02-03 DIAGNOSIS — R9431 Abnormal electrocardiogram [ECG] [EKG]: Secondary | ICD-10-CM | POA: Diagnosis not present

## 2018-02-03 DIAGNOSIS — K219 Gastro-esophageal reflux disease without esophagitis: Secondary | ICD-10-CM | POA: Insufficient documentation

## 2018-02-03 DIAGNOSIS — M069 Rheumatoid arthritis, unspecified: Secondary | ICD-10-CM | POA: Insufficient documentation

## 2018-02-03 LAB — MYOCARDIAL PERFUSION IMAGING
LV dias vol: 112 mL (ref 46–106)
LV sys vol: 64 mL
Peak HR: 86 {beats}/min
Rest HR: 64 {beats}/min
SDS: 3
SRS: 4
SSS: 7
TID: 1.08

## 2018-02-03 MED ORDER — AMINOPHYLLINE 25 MG/ML IV SOLN
75.0000 mg | Freq: Once | INTRAVENOUS | Status: AC
  Administered 2018-02-03: 75 mg via INTRAVENOUS

## 2018-02-03 MED ORDER — TECHNETIUM TC 99M TETROFOSMIN IV KIT
32.0000 | PACK | Freq: Once | INTRAVENOUS | Status: AC | PRN
  Administered 2018-02-03: 32 via INTRAVENOUS
  Filled 2018-02-03: qty 32

## 2018-02-03 MED ORDER — REGADENOSON 0.4 MG/5ML IV SOLN
0.4000 mg | Freq: Once | INTRAVENOUS | Status: AC
  Administered 2018-02-03: 0.4 mg via INTRAVENOUS

## 2018-02-03 MED ORDER — TECHNETIUM TC 99M TETROFOSMIN IV KIT
10.9000 | PACK | Freq: Once | INTRAVENOUS | Status: AC | PRN
  Administered 2018-02-03: 10.9 via INTRAVENOUS
  Filled 2018-02-03: qty 11

## 2018-02-08 ENCOUNTER — Other Ambulatory Visit: Payer: Self-pay

## 2018-02-08 ENCOUNTER — Ambulatory Visit (HOSPITAL_COMMUNITY): Payer: Medicare Other | Attending: Cardiology

## 2018-02-08 DIAGNOSIS — R079 Chest pain, unspecified: Secondary | ICD-10-CM | POA: Diagnosis not present

## 2018-02-08 DIAGNOSIS — J449 Chronic obstructive pulmonary disease, unspecified: Secondary | ICD-10-CM | POA: Diagnosis not present

## 2018-02-08 DIAGNOSIS — I739 Peripheral vascular disease, unspecified: Secondary | ICD-10-CM

## 2018-02-08 DIAGNOSIS — R0600 Dyspnea, unspecified: Secondary | ICD-10-CM

## 2018-02-08 DIAGNOSIS — I779 Disorder of arteries and arterioles, unspecified: Secondary | ICD-10-CM | POA: Diagnosis not present

## 2018-02-08 DIAGNOSIS — R0609 Other forms of dyspnea: Secondary | ICD-10-CM

## 2018-02-09 LAB — ECHOCARDIOGRAM COMPLETE
E decel time: 183 msec
E/e' ratio: 10.53
FS: 12 % — AB (ref 28–44)
IVS/LV PW RATIO, ED: 0.84
LA ID, A-P, ES: 32 mm
LA diam end sys: 32 mm
LA diam index: 1.83 cm/m2
LA vol A4C: 47.6 ml
LA vol index: 27.7 mL/m2
LA vol: 48.5 mL
LV E/e' medial: 10.53
LV E/e'average: 10.53
LV PW d: 13.6 mm — AB (ref 0.6–1.1)
LV e' LATERAL: 5.11 cm/s
LVOT SV: 74 mL
LVOT VTI: 23.7 cm
LVOT area: 3.14 cm2
LVOT diameter: 20 mm
LVOT peak vel: 99 cm/s
Lateral S' vel: 11.2 cm/s
MV Dec: 183
MV pk A vel: 104 m/s
MV pk E vel: 53.8 m/s
Peak grad: 251 mmHg
RV sys press: 28 mmHg
Reg peak vel: 251 cm/s
TAPSE: 28.5 mm
TDI e' lateral: 5.11
TDI e' medial: 3.48
TR max vel: 251 cm/s

## 2018-02-15 ENCOUNTER — Telehealth: Payer: Self-pay | Admitting: *Deleted

## 2018-02-15 DIAGNOSIS — I739 Peripheral vascular disease, unspecified: Principal | ICD-10-CM

## 2018-02-15 DIAGNOSIS — I779 Disorder of arteries and arterioles, unspecified: Secondary | ICD-10-CM

## 2018-02-15 DIAGNOSIS — I7090 Unspecified atherosclerosis: Secondary | ICD-10-CM

## 2018-02-15 NOTE — Telephone Encounter (Signed)
-----   Message from Wellington Hampshire, MD sent at 02/10/2018  1:35 PM EST ----- Inform patient that carotid Doppler showed moderate bilateral disease.  Not severe enough to require surgery.  She should continue aspirin daily.  We should check lipid and liver profile as most likely she will require treatment with a statin.

## 2018-02-15 NOTE — Telephone Encounter (Signed)
Patient made aware of results and to come in for fasting labs. Orders have been placed.

## 2018-02-16 DIAGNOSIS — H1851 Endothelial corneal dystrophy: Secondary | ICD-10-CM | POA: Diagnosis not present

## 2018-02-16 DIAGNOSIS — Z961 Presence of intraocular lens: Secondary | ICD-10-CM | POA: Diagnosis not present

## 2018-02-16 DIAGNOSIS — H26493 Other secondary cataract, bilateral: Secondary | ICD-10-CM | POA: Diagnosis not present

## 2018-02-16 DIAGNOSIS — L718 Other rosacea: Secondary | ICD-10-CM | POA: Diagnosis not present

## 2018-02-16 DIAGNOSIS — H02403 Unspecified ptosis of bilateral eyelids: Secondary | ICD-10-CM | POA: Diagnosis not present

## 2018-02-22 ENCOUNTER — Encounter: Payer: Self-pay | Admitting: Cardiovascular Disease

## 2018-02-22 ENCOUNTER — Ambulatory Visit (INDEPENDENT_AMBULATORY_CARE_PROVIDER_SITE_OTHER): Payer: Medicare Other | Admitting: Cardiovascular Disease

## 2018-02-22 VITALS — BP 138/78 | HR 78 | Ht 64.0 in | Wt 143.8 lb

## 2018-02-22 DIAGNOSIS — E785 Hyperlipidemia, unspecified: Secondary | ICD-10-CM | POA: Diagnosis not present

## 2018-02-22 DIAGNOSIS — I739 Peripheral vascular disease, unspecified: Principal | ICD-10-CM

## 2018-02-22 DIAGNOSIS — I428 Other cardiomyopathies: Secondary | ICD-10-CM

## 2018-02-22 DIAGNOSIS — I779 Disorder of arteries and arterioles, unspecified: Secondary | ICD-10-CM | POA: Diagnosis not present

## 2018-02-22 DIAGNOSIS — I7090 Unspecified atherosclerosis: Secondary | ICD-10-CM | POA: Diagnosis not present

## 2018-02-22 LAB — LIPID PANEL
Chol/HDL Ratio: 3.6 ratio (ref 0.0–4.4)
Cholesterol, Total: 237 mg/dL — ABNORMAL HIGH (ref 100–199)
HDL: 66 mg/dL (ref >39)
LDL Calculated: 138 mg/dL — ABNORMAL HIGH (ref 0–99)
Triglycerides: 165 mg/dL — ABNORMAL HIGH (ref 0–149)
VLDL Cholesterol Cal: 33 mg/dL (ref 5–40)

## 2018-02-22 LAB — HEPATIC FUNCTION PANEL
ALT: 12 IU/L (ref 0–32)
AST: 15 IU/L (ref 0–40)
Albumin: 4 g/dL (ref 3.5–4.7)
Alkaline Phosphatase: 105 IU/L (ref 39–117)
Bilirubin Total: 0.6 mg/dL (ref 0.0–1.2)
Bilirubin, Direct: 0.16 mg/dL (ref 0.00–0.40)
Total Protein: 7.2 g/dL (ref 6.0–8.5)

## 2018-02-22 NOTE — Patient Instructions (Signed)
Medication Instructions: Take Aspirin daily  If you need a refill on your cardiac medications before your next appointment, please call your pharmacy.    Follow-Up: Your physician wants you to follow-up in 6 months with Dr. Fletcher Anon. You will receive a reminder letter in the mail two months in advance. If you don't receive a letter, please call our office at [phone] to schedule this follow-up appointment.   Thank you for choosing Heartcare at [facility]!!

## 2018-02-22 NOTE — Progress Notes (Signed)
Cardiology Office Note   Date:  02/22/2018   ID:  Lisa Vega, DOB Jun 03, 1932, MRN [id number]  PCP:  Binnie Rail, MD  Cardiologist:   Kathlyn Sacramento, MD   No chief complaint on file.     History of Present Illness: Lisa Vega is a 84 y.o. female who is here today for a follow-up visit regarding bilateral carotid disease and left bundle branch block.   She has known history of COPD, lung cancer status post right lower lobe lobectomy in 2005, rheumatoid arthritis and previous tobacco use. CT scan of the neck in November showed advanced atherosclerosis affecting both carotid bifurcations. She was seen recently and was noted to have left bundle branch block .  She reported  intermittent episodes of substernal chest tightness at rest.  She also noted significant exertional dyspnea.   She underwent carotid Doppler which showed moderate bilateral disease worse on the left side at 60-79%. Lexiscan Myoview showed no evidence of ischemia but EF was estimated to be 43%. Echocardiogram showed mildly reduced ejection fraction at 45-50% with no significant valvular abnormalities. She reports resolution of chest pain.  Exertional dyspnea is stable.  She continues to have orthostatic dizziness.   Past Medical History:  Diagnosis Date  . Cancer ([place])    lung ca  . COPD (chronic obstructive pulmonary disease) ([place])   . Dyspnea   . GERD (gastroesophageal reflux disease)   . Hypercholesteremia   . Memory difficulties 10/28/2015  . Rheumatoid arthritis(714.0)   . Thyroid disease   . Tremor 10/28/2015   Jaw tremor  . Uterine prolapse   . Vitamin D deficiency     Past Surgical History:  Procedure Laterality Date  . APPENDECTOMY    . HEMORRHOID SURGERY    . right lobectomy  10/2004  . TONSILLECTOMY AND ADENOIDECTOMY       Current Outpatient Medications  Medication Sig Dispense Refill  . acetaminophen (TYLENOL) 325 MG tablet Take 650 mg by mouth every 6 (six) hours as  needed.    . ALPRAZolam (XANAX) 0.25 MG tablet Take 1 tablet (0.25 mg total) by mouth daily as needed for anxiety. 20 tablet 1  . aspirin EC 81 MG tablet Take 1 tablet (81 mg total) by mouth daily. 90 tablet 3  . budesonide-formoterol (SYMBICORT) 160-4.5 MCG/ACT inhaler 2 puffs BID 1 Inhaler 0  . Cholecalciferol (VITAMIN D) 2000 units CAPS Take 2,000 Units by mouth daily.    . folic acid (FOLVITE) 1 MG tablet Take 1 mg by mouth daily.    Marland Kitchen gabapentin (NEURONTIN) 100 MG capsule One capsule three times a day 270 capsule 3  . glucose blood (FREESTYLE LITE) test strip Use as instructed once daily to check sugars, prediabetes 100 each 3  . leflunomide (ARAVA) 10 MG tablet Take 10 mg by mouth daily.    Marland Kitchen loratadine (CLARITIN) 10 MG tablet Take 10 mg by mouth daily.    . NONFORMULARY OR COMPOUNDED ITEM Estradiol .02% 1 ML Prefilled Applicator Sig: apply vaginally twice a week #90 Day Supply with 4 refills 1 each 0  . omeprazole (PRILOSEC) 40 MG capsule Take 1 capsule (40 mg total) by mouth daily. 90 capsule 3  . potassium chloride SA (K-DUR,KLOR-CON) 20 MEQ tablet Take 1 tablet (20 mEq total) by mouth daily. 30 tablet 2  . PROAIR HFA 108 (90 Base) MCG/ACT inhaler USE 1-2 PUFFS EVERY 4-6 HOURS AS NEEDED 8.5 g 1  . ranitidine (ZANTAC) 150 MG tablet TAKE 1  TABLET BY MOUTH EVERY EVENING AT BEDTIME AS NEEDED FOR HEARTBURN 90 tablet 1  . sertraline (ZOLOFT) 100 MG tablet Take 1 tablet (100 mg total) by mouth daily. 90 tablet 1  . SYNTHROID 88 MCG tablet TAKE 1 TABLET DAILY 90 tablet 0  . SYNTHROID 88 MCG tablet TAKE 1 TABLET DAILY 90 tablet 1  . triamcinolone cream (KENALOG) 0.1 % Apply 1 application daily as needed topically. Thin layer on vulva as needed 30 g 3   No current facility-administered medications for this visit.     Allergies:   Aspirin; Codeine; Demerol; and Meperidine hcl    Social History:  The patient  reports that she quit smoking about 13 years ago. Her smoking use included  cigarettes. She has a 20.00 pack-year smoking history. she has never used smokeless tobacco. She reports that she drinks about 0.6 oz of alcohol per week. She reports that she does not use drugs.   Family History:  The patient's family history includes Asthma in her mother; Bone cancer in her father; Emphysema in her mother; Heart disease in her brother and sister; Lung cancer in her father; Pancreatic cancer in her father; Stroke in her maternal grandfather.    ROS:  Please see the history of present illness.   Otherwise, review of systems are positive for none.   All other systems are reviewed and negative.    PHYSICAL EXAM: VS:  BP 138/78   Pulse 78   Ht 5\' 4"  (1.626 m)   Wt 143 lb 12.8 oz (65.2 kg)   BMI 24.68 kg/m  , BMI Body mass index is 24.68 kg/m. GEN: Well nourished, well developed, in no acute distress  HEENT: normal  Neck: no JVD, carotid bruits, or masses Cardiac: RRR; no murmurs, rubs, or gallops,no edema  Respiratory:  clear to auscultation bilaterally, normal work of breathing GI: soft, nontender, nondistended, + BS MS: no deformity or atrophy  Skin: warm and dry, no rash Neuro:  Strength and sensation are intact Psych: euthymic mood, full affect   EKG:  EKG is not  ordered today.   Recent Labs: 08/06/2017: Pro B Natriuretic peptide (BNP) 163.0 09/03/2017: Hemoglobin 12.7; Platelets 234.0 09/22/2017: TSH 1.40 11/01/2017: BUN 10; Creatinine, Ser 0.83; Potassium 3.8; Sodium 137 02/22/2018: ALT 12    Lipid Panel    Component Value Date/Time   CHOL 237 (H) 02/22/2018 1112   TRIG 165 (H) 02/22/2018 1112   HDL 66 02/22/2018 1112   CHOLHDL 3.6 02/22/2018 1112   LDLCALC 138 (H) 02/22/2018 1112      Wt Readings from Last 3 Encounters:  02/22/18 143 lb 12.8 oz (65.2 kg)  02/03/18 146 lb (66.2 kg)  01/25/18 146 lb (66.2 kg)       No flowsheet data found.    ASSESSMENT AND PLAN:  1.  Moderate bilateral carotid disease worse on the left side: I asked her  to take aspirin 81 mg daily and not every other day.  She was concerned about aspirin raising her blood pressure but I explained to her at this dose, it does not do that. I discussed with her the importance of controlling her risk factors.  2.  Cardiomyopathy: Overall mild with an EF of 45-50%.  No evidence of ischemia on nuclear stress test.  No evidence of volume overload.  I discussed with her the indications for treatment with a beta-blocker and an ACE inhibitor.  The patient is very hesitant about trying any new medications given her intolerance to  multiple medications and also continued orthostatic dizziness.  It might be just reasonable to monitor her clinically for now.  3. Hyperlipidemia: Check lipid and liver profile today with low threshold to start treatment with a statin.   Disposition:   FU with me in 6 months  Signed,  Kathlyn Sacramento, MD  02/22/2018 5:39 PM    [place]

## 2018-02-25 ENCOUNTER — Telehealth: Payer: Self-pay | Admitting: *Deleted

## 2018-02-25 DIAGNOSIS — Z79899 Other long term (current) drug therapy: Secondary | ICD-10-CM

## 2018-02-25 DIAGNOSIS — E785 Hyperlipidemia, unspecified: Secondary | ICD-10-CM

## 2018-02-25 MED ORDER — ROSUVASTATIN CALCIUM 20 MG PO TABS: 20.0000 mg | ORAL_TABLET | Freq: Every day | ORAL | 11 refills | Status: DC

## 2018-02-25 NOTE — Telephone Encounter (Signed)
Patient made aware of results and verbalized her understanding. Medication has been called in for 30 days per her request. A call will be placed to her in 6 weeks as a reminder to come have her repeat fasting labs drawn (per her request)

## 2018-02-25 NOTE — Telephone Encounter (Signed)
-----   Message from Wellington Hampshire, MD sent at 02/25/2018 12:42 PM EST ----- Inform patient that labs were normal. Cholesterol was high.  Recommend starting rosuvastatin 20 mg daily.  Repeat lipid and liver profile in 6 weeks.

## 2018-03-03 ENCOUNTER — Encounter: Payer: Medicare Other | Admitting: Obstetrics & Gynecology

## 2018-03-08 ENCOUNTER — Telehealth: Payer: Self-pay | Admitting: Neurology

## 2018-03-08 NOTE — Telephone Encounter (Signed)
Dr. Jannifer Franklin- okay to work pt in?

## 2018-03-08 NOTE — Telephone Encounter (Signed)
Called pt back. Scheduled appt with her for 03/10/18 at 10:00am, check in 9:30am for sooner appt. Cx appt made for 09/07/18. She verbalized understanding and appreciation for call.

## 2018-03-08 NOTE — Telephone Encounter (Signed)
Pt has called to request an appointment because of headaches, burning on top of head, dizzyness when laying on right side.  Pt has accepted 1st available appointment for 09-07-2018 and is on wait list.  Pt said she feels pretty bad when she gets up in the mornings and would like to be called if there is anyway to get in before the month of September please call

## 2018-03-08 NOTE — Telephone Encounter (Signed)
Okay to work the patient in.

## 2018-03-10 ENCOUNTER — Telehealth: Payer: Self-pay | Admitting: Neurology

## 2018-03-10 ENCOUNTER — Ambulatory Visit (INDEPENDENT_AMBULATORY_CARE_PROVIDER_SITE_OTHER): Payer: Medicare Other | Admitting: Neurology

## 2018-03-10 ENCOUNTER — Other Ambulatory Visit: Payer: Self-pay

## 2018-03-10 ENCOUNTER — Encounter: Payer: Self-pay | Admitting: Neurology

## 2018-03-10 VITALS — BP 121/78 | HR 79 | Ht 64.0 in | Wt 144.0 lb

## 2018-03-10 DIAGNOSIS — R251 Tremor, unspecified: Secondary | ICD-10-CM | POA: Diagnosis not present

## 2018-03-10 DIAGNOSIS — H8110 Benign paroxysmal vertigo, unspecified ear: Secondary | ICD-10-CM

## 2018-03-10 DIAGNOSIS — G8929 Other chronic pain: Secondary | ICD-10-CM

## 2018-03-10 DIAGNOSIS — A881 Epidemic vertigo: Secondary | ICD-10-CM | POA: Diagnosis not present

## 2018-03-10 DIAGNOSIS — R51 Headache: Secondary | ICD-10-CM | POA: Diagnosis not present

## 2018-03-10 HISTORY — DX: Benign paroxysmal vertigo, unspecified ear: H81.10

## 2018-03-10 MED ORDER — GABAPENTIN 100 MG PO CAPS: 200.0000 mg | ORAL_CAPSULE | Freq: Three times a day (TID) | ORAL | 1 refills | Status: DC

## 2018-03-10 NOTE — Telephone Encounter (Signed)
Medicare/bcbs fed order sent to GI not auth they will reach out to patient to schedule.

## 2018-03-10 NOTE — Patient Instructions (Addendum)
   We will get MRI of the brain.  Try to do the Eppley procedure at home for the Vertigo.

## 2018-03-10 NOTE — Progress Notes (Signed)
Reason for visit: Dizziness, headache  Lisa Vega is a 84 y.o. female  History of present illness:  Lisa Vega is an 84 year old right-handed white female with a history of rheumatoid arthritis.  The patient has been seen for an essential tremor that mainly affects the jaw, sometimes the voice can be affected.  The patient comes in with new problems today.  Within the last 3 months, the patient has developed some issues with vertigo and headache.  The patient has a prior history of positional vertigo has been treated in the past.  The patient indicates that this has recurred, if she lies down on the right side she will get vertigo, occasionally she may get it on the left side.  If she moves her head position, the vertigo goes away.  The patient denies any nausea or vomiting with this.  The patient has had onset as well within the last 3 months of some left-sided neck and shoulder discomfort, the pain goes up the neck into the back of the head and she will have a burning sensation on the top of the head.  This is a new issue for her.  She does have a history of cerebrovascular disease, a carotid Doppler study done on 03 February 2018 reveals evidence of 60-79% stenosis of the left internal carotid artery and 40-59% stenosis on the right internal carotid artery.  The patient has reported no new numbness or weakness of the face, arms, or legs.  She does have a mild memory disturbance that has gradually worsened over time.  The patient is on gabapentin currently taking 100 mg 3 times daily, she tolerates the medication well.  The patient has had increased stiffness of the neck, she has lost mobility and turning her head to the left since the onset of the neck pain and headache.  She denies discomfort down either arm.  She has not had any focal weakness or numbness of the extremities, she denies any vision changes.  The patient has not had any change in speech, she has had some problems with  swallowing, this has apparently been evaluated recently with a swallowing study.  She comes in today for an evaluation.  Past Medical History:  Diagnosis Date  . BPV (benign positional vertigo) 03/10/2018  . Cancer ([place])    lung ca  . COPD (chronic obstructive pulmonary disease) ([place])   . Dyspnea   . GERD (gastroesophageal reflux disease)   . Hypercholesteremia   . Memory difficulties 10/28/2015  . Rheumatoid arthritis(714.0)   . Thyroid disease   . Tremor 10/28/2015   Jaw tremor  . Uterine prolapse   . Vitamin D deficiency     Past Surgical History:  Procedure Laterality Date  . APPENDECTOMY    . HEMORRHOID SURGERY    . right lobectomy  10/2004  . TONSILLECTOMY AND ADENOIDECTOMY      Family History  Problem Relation Age of Onset  . Emphysema Mother   . Asthma Mother   . Lung cancer Father   . Pancreatic cancer Father   . Bone cancer Father   . Heart disease Brother        x1  . Heart disease Sister        x2  . Stroke Maternal Grandfather     Social history:  reports that she quit smoking about 13 years ago. Her smoking use included cigarettes. She has a 20.00 pack-year smoking history. she has never used smokeless tobacco. She reports that  she drinks about 0.6 oz of alcohol per week. She reports that she does not use drugs.  Medications:  Prior to Admission medications   Medication Sig Start Date End Date Taking? Authorizing Provider  acetaminophen (TYLENOL) 325 MG tablet Take 650 mg by mouth every 6 (six) hours as needed.   Yes [provider]  ALPRAZolam (XANAX) 0.25 MG tablet Take 1 tablet (0.25 mg total) by mouth daily as needed for anxiety. 09/22/17  Yes Binnie Rail, MD  aspirin EC 81 MG tablet Take 1 tablet (81 mg total) by mouth daily. 01/25/18  Yes Wellington Hampshire, MD  budesonide-formoterol [facility]) 160-4.5 MCG/ACT inhaler 2 puffs BID 12/30/17  Yes Juanito Doom, MD  Cholecalciferol (VITAMIN D) 2000 units CAPS Take 2,000 Units by mouth  daily.   Yes [provider]  folic acid (FOLVITE) 1 MG tablet Take 1 mg by mouth daily.   Yes [provider]  gabapentin (NEURONTIN) 100 MG capsule Take 2 capsules (200 mg total) by mouth 3 (three) times daily. 03/10/18  Yes Kathrynn Ducking, MD  glucose blood (FREESTYLE LITE) test strip Use as instructed once daily to check sugars, prediabetes 11/09/16  Yes Burns, [name], MD  leflunomide (ARAVA) 10 MG tablet Take 10 mg by mouth daily.   Yes [provider]  loratadine (CLARITIN) 10 MG tablet Take 10 mg by mouth daily.   Yes [provider]  NONFORMULARY OR COMPOUNDED ITEM Estradiol .02% 1 ML Prefilled Applicator Sig: apply vaginally twice a week #90 Day Supply with 4 refills 06/17/17  Yes Terrance Mass, MD  omeprazole (PRILOSEC) 40 MG capsule Take 1 capsule (40 mg total) by mouth daily. 05/13/17  Yes Burns, [name], MD  potassium chloride SA (K-DUR,KLOR-CON) 20 MEQ tablet Take 1 tablet (20 mEq total) by mouth daily. 09/24/17  Yes Burns, [name], MD  PROAIR HFA 108 [phone] Base) MCG/ACT inhaler USE 1-2 PUFFS EVERY 4-6 HOURS AS NEEDED 10/05/16  Yes Burns, [name], MD  ranitidine (ZANTAC) 150 MG tablet TAKE 1 TABLET BY MOUTH EVERY EVENING AT BEDTIME AS NEEDED FOR HEARTBURN 11/29/17  Yes Burns, [name], MD  rosuvastatin (CRESTOR) 20 MG tablet Take 1 tablet (20 mg total) by mouth daily. 02/25/18 05/26/18 Yes Wellington Hampshire, MD  sertraline (ZOLOFT) 100 MG tablet Take 1 tablet (100 mg total) by mouth daily. 12/15/17  Yes Binnie Rail, MD  SYNTHROID 88 MCG tablet TAKE 1 TABLET DAILY 09/06/17  Yes Binnie Rail, MD  SYNTHROID 88 MCG tablet TAKE 1 TABLET DAILY 12/01/17  Yes Binnie Rail, MD  triamcinolone cream (KENALOG) 0.1 % Apply 1 application daily as needed topically. Thin layer on vulva as needed 11/12/17  Yes Princess Bruins, MD      Allergies  Allergen Reactions  . Aspirin   . Codeine     REACTION: hallucinations  . Demerol   . Meperidine Hcl      REACTION: severe GI upset    ROS:  Out of a complete 14 system review of symptoms, the patient complains only of the following symptoms, and all other reviewed systems are negative.  Decreased activity, decreased appetite, fatigue Difficulty swallowing Eye discharge, eye redness, light sensitivity Shortness of breath Excessive thirst Daytime sleepiness Leg swelling, neck pain, neck stiffness Memory loss, dizziness, headache, tremors Anxiety  Blood pressure 121/78, pulse 79, height 5\' 4"  (1.626 m), weight 144 lb (65.3 kg), SpO2 98 %.  Physical Exam  General: The patient is alert  and cooperative at the time of the examination.  Eyes: Pupils are equal, round, and reactive to light. Discs are flat bilaterally.  Ears: Tympanic membranes are clear bilaterally.  Neck: The neck is supple, no carotid bruits are noted.  Respiratory: The respiratory examination is clear.  Cardiovascular: The cardiovascular examination reveals a regular rate and rhythm, no obvious murmurs or rubs are noted.  Neuromuscular: The patient lacks about 30 or 35 degrees with turning the head to the left, she lacks about 10 degrees when turning the head to the right.  Skin: Extremities are without significant edema.  Neurologic Exam  Mental status: The patient is alert and oriented x 3 at the time of the examination. The patient has apparent normal recent and remote memory, with an apparently normal attention span and concentration ability.  Cranial nerves: Facial symmetry is present. There is good sensation of the face to pinprick and soft touch bilaterally. The strength of the facial muscles and the muscles to head turning and shoulder shrug are normal bilaterally. Speech is well enunciated, no aphasia or dysarthria is noted. Extraocular movements are full. Visual fields are full. The tongue is midline, and the patient has symmetric elevation of the soft palate. No obvious hearing deficits are noted.  Motor:  The motor testing reveals 5 over 5 strength of all 4 extremities. Good symmetric motor tone is noted throughout.  Sensory: Sensory testing is intact to pinprick, soft touch and vibration sensation on all 4 extremities. No evidence of extinction is noted.  Coordination: Cerebellar testing reveals good finger-nose-finger and heel-to-shin bilaterally.   Gait and station: Gait is normal. Tandem gait is unsteady. Romberg is negative. No drift is seen.  Reflexes: Deep tendon reflexes are symmetric and normal bilaterally.    Assessment/Plan:  1.  Neck pain, left-sided, headache  2.  Rheumatoid arthritis  3.  Mild memory disturbance  4.  Positional vertigo with recurrence  5.  Carotid vessel disease, bilateral  The patient will try to do the Epley maneuvers in the home environment.  The patient will be sent for MRI of the brain.  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  The gabapentin will be increased taking 200 mg 3 times daily.  The patient will be sent for physical therapy if the MRI of the brain is unremarkable.  She will follow-up for next scheduled appointment.  Jill Alexanders MD 03/10/2018 10:29 AM  [facility] [address] [place],  [address]  Phone [phone] Fax [phone]

## 2018-03-11 LAB — SEDIMENTATION RATE: Sed Rate: 21 mm/hr (ref 0–40)

## 2018-03-11 LAB — C-REACTIVE PROTEIN: CRP: 2.8 mg/L (ref 0.0–4.9)

## 2018-03-13 NOTE — Patient Instructions (Addendum)
A prescription for blood work was given.    All other Health Maintenance issues reviewed.   All recommended immunizations and age-appropriate screenings are up-to-date or discussed.  No immunizations administered today.   Medications reviewed and updated.  No changes recommended at this time.   Please followup in 6 months

## 2018-03-13 NOTE — Progress Notes (Signed)
Subjective:    Patient ID: Lisa Vega, female    DOB: Sep 17, 1932, 84 y.o.   MRN: [id number]  HPI The patient is here for follow up.  Diabetes: She is controlling her sugars with diet. She is compliant with a diabetic diet. She is not exercising regularly.  She checks her feet daily and denies foot lesions. She is up-to-date with an ophthalmology examination.   Hypothyroidism:  She is taking her medication daily.  She denies any recent changes in energy or weight that are unexplained.   Depression, anxiety: She is taking her medication daily as prescribed. She rarely takes xanax and only takes a 1/2 of a pill.  She denies any side effects from the medication. She feels her depression and anxiety are well controlled and she is happy with her current dose of medication.   GERD:  She is taking her medication daily as prescribed.  She denies any GERD symptoms and feels her GERD is well controlled.   Her RA, OA pain is severe at times.  She is following with orthopedics and rheum.   Medications and allergies reviewed with patient and updated if appropriate.  Patient Active Problem List   Diagnosis Date Noted  . BPV (benign positional vertigo) 03/10/2018  . Difficulty sleeping 09/22/2017  . Diabetes ([place]) 05/13/2017  . Fatigue 05/12/2017  . CAP (community acquired pneumonia) 05/03/2017  . Orthopnea 01/12/2017  . GERD (gastroesophageal reflux disease) 11/09/2016  . Osteoporosis 05/06/2016  . Depression with anxiety 05/06/2016  . Hiatal hernia 05/06/2016  . Hypothyroidism 05/06/2016  . Tremor 10/28/2015  . Memory difficulties 10/28/2015  . Hoarseness 10/14/2015  . History of vitamin D deficiency 05/02/2015  . Other constipation 01/29/2015  . Abdominal pain, chronic, epigastric 11/01/2014  . 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 ([place]) 05/30/2014  . Diverticulitis of colon without hemorrhage 08/07/2013  . Prolapse of female pelvic organs 06/22/2013  . Neck pain  03/10/2013  . Vaginal atrophy 03/09/2013  . Rectocele 03/09/2013  . Cystocele 03/09/2013  . Female pelvic pain 03/09/2013  . Urinary incontinence 03/09/2013  . COPD (chronic obstructive pulmonary disease) ([place]) 01/16/2011  . CARCINOMA, LUNG, NONSMALL CELL 12/17/2010  . Rheumatoid arthritis ([place]) 12/17/2010  . Dyspnea on exertion 12/17/2010    Current Outpatient Medications on File Prior to Visit  Medication Sig Dispense Refill  . acetaminophen (TYLENOL) 325 MG tablet Take 650 mg by mouth every 6 (six) hours as needed.    . ALPRAZolam (XANAX) 0.25 MG tablet Take 1 tablet (0.25 mg total) by mouth daily as needed for anxiety. 20 tablet 1  . aspirin EC 81 MG tablet Take 1 tablet (81 mg total) by mouth daily. 90 tablet 3  . budesonide-formoterol (SYMBICORT) 160-4.5 MCG/ACT inhaler 2 puffs BID 1 Inhaler 0  . Cholecalciferol (VITAMIN D) 2000 units CAPS Take 2,000 Units by mouth daily.    . folic acid (FOLVITE) 1 MG tablet Take 1 mg by mouth daily.    Marland Kitchen gabapentin (NEURONTIN) 100 MG capsule Take 2 capsules (200 mg total) by mouth 3 (three) times daily. 540 capsule 1  . glucose blood (FREESTYLE LITE) test strip Use as instructed once daily to check sugars, prediabetes 100 each 3  . leflunomide (ARAVA) 10 MG tablet Take 10 mg by mouth daily.    . NONFORMULARY OR COMPOUNDED ITEM Estradiol .02% 1 ML Prefilled Applicator Sig: apply vaginally twice a week #90 Day Supply with 4 refills 1 each 0  . omeprazole (PRILOSEC) 40 MG capsule Take  1 capsule (40 mg total) by mouth daily. 90 capsule 3  . potassium chloride SA (K-DUR,KLOR-CON) 20 MEQ tablet Take 1 tablet (20 mEq total) by mouth daily. 30 tablet 2  . PROAIR HFA 108 (90 Base) MCG/ACT inhaler USE 1-2 PUFFS EVERY 4-6 HOURS AS NEEDED 8.5 g 1  . rosuvastatin (CRESTOR) 20 MG tablet Take 1 tablet (20 mg total) by mouth daily. 30 tablet 11  . sertraline (ZOLOFT) 100 MG tablet Take 1 tablet (100 mg total) by mouth daily. 90 tablet 1  . SYNTHROID 88 MCG  tablet TAKE 1 TABLET DAILY 90 tablet 1  . triamcinolone cream (KENALOG) 0.1 % Apply 1 application daily as needed topically. Thin layer on vulva as needed 30 g 3   No current facility-administered medications on file prior to visit.     Past Medical History:  Diagnosis Date  . BPV (benign positional vertigo) 03/10/2018  . Cancer ([place])    lung ca  . COPD (chronic obstructive pulmonary disease) ([place])   . Dyspnea   . GERD (gastroesophageal reflux disease)   . Hypercholesteremia   . Memory difficulties 10/28/2015  . Rheumatoid arthritis(714.0)   . Thyroid disease   . Tremor 10/28/2015   Jaw tremor  . Uterine prolapse   . Vitamin D deficiency     Past Surgical History:  Procedure Laterality Date  . APPENDECTOMY    . HEMORRHOID SURGERY    . right lobectomy  10/2004  . TONSILLECTOMY AND ADENOIDECTOMY      Social History   Socioeconomic History  . Marital status: Married    Spouse name: Not on file  . Number of children: 3  . Years of education: LPN  . Highest education level: Not on file  Social Needs  . Financial resource strain: Not on file  . Food insecurity - worry: Not on file  . Food insecurity - inability: Not on file  . Transportation needs - medical: Not on file  . Transportation needs - non-medical: Not on file  Occupational History  . Occupation: retired    Fish farm manager: RETIRED    Comment: LPN at [employer]  Tobacco Use  . Smoking status: Former Smoker    Packs/day: 1.00    Years: 20.00    Pack years: 20.00    Types: Cigarettes    Last attempt to quit: 11/10/2004    Years since quitting: 13.3  . Smokeless tobacco: Never Used  Substance and Sexual Activity  . Alcohol use: Yes    Alcohol/week: 0.6 oz    Types: 1 Glasses of wine per week    Comment: socially  . Drug use: No  . Sexual activity: No    Comment: 1st intercourse- 19, partners- 5,   Other Topics Concern  . Not on file  Social History Narrative   Patient drinks about 3 cups of  coffee daily.   Patient is right handed.     Family History  Problem Relation Age of Onset  . Emphysema Mother   . Asthma Mother   . Lung cancer Father   . Pancreatic cancer Father   . Bone cancer Father   . Heart disease Brother        x1  . Heart disease Sister        x2  . Stroke Maternal Grandfather     Review of Systems  Constitutional: Positive for appetite change, fatigue and fever (low grade).  HENT: Positive for sore throat and trouble swallowing.   Respiratory: Positive  for cough and shortness of breath. Negative for wheezing.   Cardiovascular: Positive for palpitations. Negative for chest pain and leg swelling.  Musculoskeletal: Positive for arthralgias.  Neurological: Positive for dizziness (intermittent) and headaches (burning sensation top of head).  Psychiatric/Behavioral: Positive for dysphoric mood (controlled). The patient is nervous/anxious (controlled).        Objective:   Vitals:   03/14/18 1121  BP: 132/82  Pulse: 80  Resp: 16  Temp: 98.3 F (36.8 C)  SpO2: 95%   BP Readings from Last 3 Encounters:  03/14/18 132/82  03/10/18 121/78  02/22/18 138/78   Wt Readings from Last 3 Encounters:  03/14/18 144 lb (65.3 kg)  03/10/18 144 lb (65.3 kg)  02/22/18 143 lb 12.8 oz (65.2 kg)   Body mass index is 24.72 kg/m.   Physical Exam    Constitutional: Appears well-developed and well-nourished. No distress.  HENT:  Head: Normocephalic and atraumatic.  Neck: Neck supple. No tracheal deviation present. No thyromegaly present.  No cervical lymphadenopathy Cardiovascular: Normal rate, regular rhythm and normal heart sounds.   No murmur heard. No carotid bruit .  No edema Pulmonary/Chest: Effort normal and breath sounds normal. No respiratory distress. No has no wheezes. No rales.  Skin: Skin is warm and dry. Not diaphoretic.  Psychiatric: Normal mood and affect. Behavior is normal.      Assessment & Plan:    See Problem List for Assessment  and Plan of chronic medical problems.

## 2018-03-14 ENCOUNTER — Encounter: Payer: Self-pay | Admitting: Internal Medicine

## 2018-03-14 ENCOUNTER — Ambulatory Visit (INDEPENDENT_AMBULATORY_CARE_PROVIDER_SITE_OTHER): Payer: Medicare Other | Admitting: Internal Medicine

## 2018-03-14 VITALS — BP 132/82 | HR 80 | Temp 98.3°F | Resp 16 | Wt 144.0 lb

## 2018-03-14 DIAGNOSIS — E119 Type 2 diabetes mellitus without complications: Secondary | ICD-10-CM | POA: Diagnosis not present

## 2018-03-14 DIAGNOSIS — F418 Other specified anxiety disorders: Secondary | ICD-10-CM

## 2018-03-14 DIAGNOSIS — E038 Other specified hypothyroidism: Secondary | ICD-10-CM | POA: Diagnosis not present

## 2018-03-14 DIAGNOSIS — K219 Gastro-esophageal reflux disease without esophagitis: Secondary | ICD-10-CM

## 2018-03-14 MED ORDER — MECLIZINE HCL 25 MG PO TABS: 12.5000 mg | ORAL_TABLET | Freq: Three times a day (TID) | ORAL | 5 refills | Status: DC | PRN

## 2018-03-14 MED ORDER — RANITIDINE HCL 150 MG PO TABS: ORAL_TABLET | ORAL | 3 refills | Status: DC

## 2018-03-14 MED ORDER — BLOOD GLUCOSE MONITOR KIT: PACK | 0 refills | Status: DC

## 2018-03-14 NOTE — Assessment & Plan Note (Signed)
Diet controlled Not always compliant with diabetic diet - stressed diabetic diet Unable to exercise regularly F/u in 6 months

## 2018-03-14 NOTE — Assessment & Plan Note (Signed)
GERD controlled Continue daily medication  

## 2018-03-14 NOTE — Assessment & Plan Note (Signed)
Check tsh  Titrate med dose if needed  

## 2018-03-14 NOTE — Assessment & Plan Note (Signed)
Controlled, stable Continue current dose of medication  

## 2018-03-16 DIAGNOSIS — M1712 Unilateral primary osteoarthritis, left knee: Secondary | ICD-10-CM | POA: Diagnosis not present

## 2018-03-24 ENCOUNTER — Ambulatory Visit
Admission: RE | Admit: 2018-03-24 | Discharge: 2018-03-24 | Disposition: A | Discharge status: Home / Self Care | Payer: Medicare Other | Source: Ambulatory Visit | Attending: Neurology | Admitting: Neurology

## 2018-03-24 DIAGNOSIS — A881 Epidemic vertigo: Secondary | ICD-10-CM | POA: Diagnosis not present

## 2018-03-24 DIAGNOSIS — R51 Headache: Secondary | ICD-10-CM | POA: Diagnosis not present

## 2018-03-24 DIAGNOSIS — G8929 Other chronic pain: Secondary | ICD-10-CM

## 2018-03-25 ENCOUNTER — Ambulatory Visit: Payer: Federal, State, Local not specified - PPO | Admitting: Internal Medicine

## 2018-03-25 ENCOUNTER — Telehealth: Payer: Self-pay | Admitting: Neurology

## 2018-03-25 NOTE — Telephone Encounter (Signed)
  I called the patient.  The MRI of the brain showed mild small vessel ischemic changes that are unchanged from 2 years ago.  The patient was having some neck discomfort, if she wishes to have some physical therapy on this, I will get this set up.  MRI brain 03/25/18:  IMPRESSION:  Unremarkable MRI scan of the brain showing age-appropriate changes of chronic microvascular ischemia and generalized cerebral atrophy. No acute abnormality seen. No significant change compared with previous MRI dated 01/20/2016

## 2018-03-29 ENCOUNTER — Ambulatory Visit (INDEPENDENT_AMBULATORY_CARE_PROVIDER_SITE_OTHER): Payer: Medicare Other | Admitting: Obstetrics & Gynecology

## 2018-03-29 ENCOUNTER — Encounter: Payer: Self-pay | Admitting: Obstetrics & Gynecology

## 2018-03-29 VITALS — BP 136/78 | Ht 62.0 in | Wt 142.0 lb

## 2018-03-29 DIAGNOSIS — Z4689 Encounter for fitting and adjustment of other specified devices: Secondary | ICD-10-CM

## 2018-03-29 DIAGNOSIS — R8290 Unspecified abnormal findings in urine: Secondary | ICD-10-CM

## 2018-03-29 NOTE — Progress Notes (Signed)
    Lisa Vega Nov 22, 1932 [id number]        84 y.o.  [id number]   RP: Pessary check  HPI: Annual GYN exam done in December 2018 with a negative Pap test.  Patient doing very well with the pessary without any vaginal bleeding.  She has a mild vaginal discharge without itching or odor.  No pelvic pain.  Complains of odor in her urine.  No fever.  Headaches under investigation.  Lost 5 pounds in the last 4 months and per patient lost 20 pounds in the last year without trying.  Body mass index normal.  Will have an annual exam with a TSH done in the near future with her primary physician.   OB History  Gravida Para Term Preterm AB Living  5 3     2 3   SAB TAB Ectopic Multiple Live Births  1   1        # Outcome Date GA Lbr Len/2nd Weight Sex Delivery Anes PTL Lv  5 Ectopic           4 SAB           3 Para           2 Para           1 Para             Past medical history,surgical history, problem list, medications, allergies, family history and social history were all reviewed and documented in the EPIC chart.   Directed ROS with pertinent positives and negatives documented in the history of present illness/assessment and plan.  Exam:  There were no vitals filed for this visit. General appearance:  Normal  Abdomen: Normal  Gynecologic exam: Vulva normal.  Pessary removed easily.  Cleaned thoroughly.  Vaginal mucosa intact.  Pessary put back in place.  U/A: Yellow clear, nitrites negative, white blood cells 6-10, red blood cells 0-2, bacteria few.  Urine culture pending.   Assessment/Plan:  84 y.o. [id number]   1. Encounter for pessary maintenance Well with pessary which is a good fit.  No complication.  Cleaned and put back in place.  Will follow up for pessary maintenance every 3-4 months.  2. Abnormal urine odor Very mild disturbance in urine analysis.  Urine culture pending.  Will wait on urine culture to decide whether antibiotic treatment is needed or not.  Recommend  good water hydration in the meantime. - Urinalysis,Complete w/RFL Culture  Counseling on above issues and coordination of care more than 50% of 15 minutes.  Princess Bruins MD, 2:17 PM 03/29/2018

## 2018-03-29 NOTE — Patient Instructions (Signed)
1. Encounter for pessary maintenance Well with pessary which is a good fit.  No complication.  Cleaned and put back in place.  Will follow up for pessary maintenance every 3-4 months.  2. Abnormal urine odor Very mild disturbance in urine analysis.  Urine culture pending.  Will wait on urine culture to decide whether antibiotic treatment is needed or not.  Recommend good water hydration in the meantime. - Urinalysis,Complete w/RFL Culture  Tiny, always good seeing you!

## 2018-03-31 LAB — URINALYSIS, COMPLETE W/RFL CULTURE
Bilirubin Urine: NEGATIVE
Glucose, UA: NEGATIVE
Hyaline Cast: NONE SEEN /LPF
Ketones, ur: NEGATIVE
Nitrites, Initial: NEGATIVE
Protein, ur: NEGATIVE
Specific Gravity, Urine: 1.004 (ref 1.001–1.03)
pH: 5.5 (ref 5.0–8.0)

## 2018-03-31 LAB — URINE CULTURE
MICRO NUMBER:: 90411558
SPECIMEN QUALITY:: ADEQUATE

## 2018-03-31 LAB — CULTURE INDICATED

## 2018-04-01 ENCOUNTER — Other Ambulatory Visit: Payer: Self-pay | Admitting: Internal Medicine

## 2018-04-01 NOTE — Telephone Encounter (Signed)
[place] Controlled Substance Database checked. Last filled on 01/27/18

## 2018-04-11 ENCOUNTER — Telehealth: Payer: Self-pay | Admitting: Emergency Medicine

## 2018-04-11 DIAGNOSIS — H8110 Benign paroxysmal vertigo, unspecified ear: Secondary | ICD-10-CM

## 2018-04-11 NOTE — Telephone Encounter (Signed)
Copied from [place] [id number]. Topic: Referral - Request >> Apr 11, 2018 10:27 AM Scherrie Gerlach wrote: Reason for CRM: pt would like a referral to Dr Aubery Lapping at [facility].  Pt states has seen this dr before, but they are requesting a referral for the first time from her pcp  FAX  [phone]

## 2018-04-11 NOTE — Telephone Encounter (Signed)
assumed it was [facility] - order placed

## 2018-04-12 NOTE — Telephone Encounter (Signed)
Spoke with pt to inform.  

## 2018-04-18 DIAGNOSIS — R5382 Chronic fatigue, unspecified: Secondary | ICD-10-CM | POA: Diagnosis not present

## 2018-04-18 DIAGNOSIS — Z79899 Other long term (current) drug therapy: Secondary | ICD-10-CM | POA: Diagnosis not present

## 2018-04-18 DIAGNOSIS — M0579 Rheumatoid arthritis with rheumatoid factor of multiple sites without organ or systems involvement: Secondary | ICD-10-CM | POA: Diagnosis not present

## 2018-04-18 DIAGNOSIS — M2550 Pain in unspecified joint: Secondary | ICD-10-CM | POA: Diagnosis not present

## 2018-04-18 DIAGNOSIS — M150 Primary generalized (osteo)arthritis: Secondary | ICD-10-CM | POA: Diagnosis not present

## 2018-04-18 DIAGNOSIS — Z6823 Body mass index (BMI) 23.0-23.9, adult: Secondary | ICD-10-CM | POA: Diagnosis not present

## 2018-04-21 DIAGNOSIS — H8111 Benign paroxysmal vertigo, right ear: Secondary | ICD-10-CM | POA: Diagnosis not present

## 2018-04-21 DIAGNOSIS — Z79899 Other long term (current) drug therapy: Secondary | ICD-10-CM | POA: Diagnosis not present

## 2018-04-21 DIAGNOSIS — E785 Hyperlipidemia, unspecified: Secondary | ICD-10-CM | POA: Diagnosis not present

## 2018-04-21 DIAGNOSIS — R42 Dizziness and giddiness: Secondary | ICD-10-CM | POA: Diagnosis not present

## 2018-04-21 LAB — HEPATIC FUNCTION PANEL
ALT: 13 IU/L (ref 0–32)
AST: 18 IU/L (ref 0–40)
Albumin: 4 g/dL (ref 3.5–4.7)
Alkaline Phosphatase: 84 IU/L (ref 39–117)
Bilirubin Total: 0.5 mg/dL (ref 0.0–1.2)
Bilirubin, Direct: 0.15 mg/dL (ref 0.00–0.40)
Total Protein: 6.7 g/dL (ref 6.0–8.5)

## 2018-04-21 LAB — LIPID PANEL
Chol/HDL Ratio: 3.9 ratio (ref 0.0–4.4)
Cholesterol, Total: 245 mg/dL — ABNORMAL HIGH (ref 100–199)
HDL: 63 mg/dL (ref >39)
LDL Calculated: 148 mg/dL — ABNORMAL HIGH (ref 0–99)
Triglycerides: 171 mg/dL — ABNORMAL HIGH (ref 0–149)
VLDL Cholesterol Cal: 34 mg/dL (ref 5–40)

## 2018-04-28 ENCOUNTER — Other Ambulatory Visit: Payer: Self-pay | Admitting: Internal Medicine

## 2018-05-02 ENCOUNTER — Telehealth: Payer: Self-pay | Admitting: Podiatry

## 2018-05-02 ENCOUNTER — Encounter: Payer: Self-pay | Admitting: Podiatry

## 2018-05-02 ENCOUNTER — Ambulatory Visit (INDEPENDENT_AMBULATORY_CARE_PROVIDER_SITE_OTHER): Payer: Medicare Other | Admitting: Podiatry

## 2018-05-02 VITALS — BP 121/56 | HR 63

## 2018-05-02 DIAGNOSIS — L84 Corns and callosities: Secondary | ICD-10-CM

## 2018-05-02 DIAGNOSIS — M2010 Hallux valgus (acquired), unspecified foot: Secondary | ICD-10-CM | POA: Diagnosis not present

## 2018-05-02 NOTE — Telephone Encounter (Signed)
lvm for pt to call to schedule an appt to see [facility] for orthotics per Dr R.

## 2018-05-04 NOTE — Progress Notes (Signed)
Subjective:   Patient ID: Lisa Vega, female   DOB: 84 y.o.   MRN: [id number]   HPI Patient presents with chronic lesion right and structural bunion is wondering whether or not it could be corrected.  States that the lesion is getting worse and making increasingly difficult to walk   ROS      Objective:  Physical Exam  Neurovascular status unchanged with patient found to have very bony feet significant structural malalignment history of rheumatoid arthritis and prominent metatarsal with keratotic lesion subsecond metatarsal right that is painful when pressed.  Patient does have good digital perfusion well oriented x3     Assessment:  Lesion secondary to bone pressure with rheumatoid foot structure as complicating factor with structural bunion     Plan:  H&P conditions reviewed and at this time I did sterile debridement of the lesion with no iatrogenic bleeding.  I discussed orthotics and I think a very soft orthotic with offloading of the second metatarsal would be of benefit to her and I am referring her to ped orthotist to have these made in the next week.  Again I have recommended very soft material to offload this area.  Discussed bunion deformity and do not recommend correction currently

## 2018-05-05 ENCOUNTER — Telehealth: Payer: Self-pay | Admitting: Cardiovascular Disease

## 2018-05-05 NOTE — Telephone Encounter (Signed)
Spoke with pt, she is having pain that starts in her left collar bone and goes up her neck into her head. She is concerned it maybe related to her carotid disease. Reassurance given to the patient and symptoms related to complete blockage of the carotid discussed with the patient. She is traveling next week and wanted a copy of the last office note to take with her in case she were to have problems. Copy mailed to the patient at her conformed home address.

## 2018-05-05 NOTE — Telephone Encounter (Signed)
New message    Patient calling to discuss pain in left side of neck. She has questions about past blockage.

## 2018-05-07 DIAGNOSIS — M5030 Other cervical disc degeneration, unspecified cervical region: Secondary | ICD-10-CM | POA: Diagnosis not present

## 2018-05-12 DIAGNOSIS — M1712 Unilateral primary osteoarthritis, left knee: Secondary | ICD-10-CM | POA: Insufficient documentation

## 2018-05-12 DIAGNOSIS — M5030 Other cervical disc degeneration, unspecified cervical region: Secondary | ICD-10-CM | POA: Diagnosis not present

## 2018-05-12 NOTE — Progress Notes (Signed)
Subjective:   Lisa Vega is a 84 y.o. female who presents for Medicare Annual (Subsequent) preventive examination.  Review of Systems:  No ROS.  Medicare Wellness Visit. Additional risk factors are reflected in the social history.  Cardiac Risk Factors include: advanced age (>60mn, >>99women);diabetes mellitus Sleep patterns: gets up 1 times nightly to void and sleeps 7 hours nightly.    Home Safety/Smoke Alarms: Feels safe in home. Smoke alarms in place.  Living environment; residence and Firearm Safety: 1-story house/ trailer, no firearms, Lives with daughter, no needs for DME, good support system Seat Belt Safety/Bike Helmet: Wears seat belt.      Objective:     Vitals: BP 120/60   Pulse 68   Resp 18   Ht 5' 2" (1.575 m)   Wt 139 lb (63 kg)   SpO2 93%   BMI 25.42 kg/m   Body mass index is 25.42 kg/m.  Advanced Directives 05/13/2018 05/12/2017 02/26/2016 10/28/2015 07/27/2014  Does Patient Have a Medical Advance Directive? Yes No Yes Yes Patient has advance directive, copy not in chart  Type of Advance Directive [place] Healthcare [place]  Does patient want to make changes to medical advance directive? - Yes (ED - Information included in AVS) - - No change requested  Copy of [place] Chart? No - copy requested - - - -  Pre-existing out of facility DNR order (yellow form or pink MOST form) - - - - No    Tobacco Social History   Tobacco Use  Smoking Status Former Smoker  . Packs/day: 1.00  . Years: 20.00  . Pack years: 20.00  . Types: Cigarettes  . Last attempt to quit: 11/10/2004  . Years since quitting: 13.5  Smokeless Tobacco Never Used     Counseling given: Not Answered  Past Medical History:  Diagnosis Date  . BPV (benign positional vertigo) 03/10/2018  . [place]  . COPD (chronic  obstructive pulmonary disease) [place]   . Dyspnea   . GERD (gastroesophageal reflux disease)   . Hypercholesteremia   . Memory difficulties 10/28/2015  . Rheumatoid arthritis(714.0)   . Thyroid disease   . Tremor 10/28/2015   Jaw tremor  . Uterine prolapse   . Vitamin D deficiency    Past Surgical History:  Procedure Laterality Date  . APPENDECTOMY    . HEMORRHOID SURGERY    . right lobectomy  10/2004  . TONSILLECTOMY AND ADENOIDECTOMY     Family History  Problem Relation Age of Onset  . Emphysema Mother   . Asthma Mother   . Lung cancer Father   . Pancreatic cancer Father   . Bone cancer Father   . Heart disease Brother        x1  . Heart disease Sister        x2  . Stroke Maternal Grandfather    Social History   Socioeconomic History  . Marital status: Widowed    Spouse name: Not on file  . Number of children: 3  . Years of education: LPN  . Highest education level: Not on file  Occupational History  . Occupation: retired    EFish farm manager RETIRED    Comment: LPN at [employer] . Financial resource strain: Not hard at all  . Food insecurity:    Worry: Never true  Inability: Never true  . Transportation needs:    Medical: No    Non-medical: No  Tobacco Use  . Smoking status: Former Smoker    Packs/day: 1.00    Years: 20.00    Pack years: 20.00    Types: Cigarettes    Last attempt to quit: 11/10/2004    Years since quitting: 13.5  . Smokeless tobacco: Never Used  Substance and Sexual Activity  . Alcohol use: Yes    Alcohol/week: 0.6 oz    Types: 1 Glasses of  per week    Comment: socially  . Drug use: No  . Sexual activity: Not Currently    Comment: 1st intercourse- 19, partners- 5,   Lifestyle  . Physical activity:    Days per week: 3 days    Minutes per session: 40 min  . Stress: To some extent  Relationships  . Social connections:    Talks on phone: More than three times a week    Gets together: More than  three times a week    Attends religious service: More than 4 times per year    Active member of club or organization: Yes    Attends meetings of clubs or organizations: More than 4 times per year    Relationship status: Widowed  Other Topics Concern  . Not on file  Social History Narrative   Patient drinks about 3 cups of coffee daily.   Patient is right handed.     Outpatient Encounter Medications as of 05/13/2018  Medication Sig  . acetaminophen (TYLENOL) 325 MG tablet Take 650 mg by mouth every 6 (six) hours as needed.  . ALPRAZolam (XANAX) 0.25 MG tablet TAKE 1 TABLET BY MOUTH DAILY AS NEEDED FOR ANXIETY  . blood glucose meter kit and supplies KIT Dispense based on patient and insurance preference. Use up to four times daily as directed. (FOR E11.9).  . budesonide-formoterol (SYMBICORT) 160-4.5 MCG/ACT inhaler 2 puffs BID  . folic acid (FOLVITE) 1 MG tablet Take 1 mg by mouth daily.  . gabapentin (NEURONTIN) 100 MG capsule Take 2 capsules (200 mg total) by mouth 3 (three) times daily.  . glucose blood (FREESTYLE LITE) test strip Use as instructed once daily to check sugars, prediabetes  . leflunomide (ARAVA) 20 MG tablet leflunomide 20 mg tablet  . linaclotide (LINZESS) 72 MCG capsule Linzess 72 mcg capsule  . meclizine (ANTIVERT) 25 MG tablet Take 0.5-1 tablets (12.5-25 mg total) by mouth 3 (three) times daily as needed for dizziness.  . NONFORMULARY OR COMPOUNDED ITEM Estradiol .02% 1 ML Prefilled Applicator Sig: apply vaginally twice a week #90 Day Supply with 4 refills  . ONETOUCH DELICA LANCETS 33G MISC USE TO CHECK SUGARS UP TO 4 TIMES A DAY  . potassium chloride SA (K-DUR,KLOR-CON) 20 MEQ tablet Take 1 tablet (20 mEq total) by mouth daily.  . predniSONE (DELTASONE) 20 MG tablet prednisone 20 mg tablet  . PROAIR HFA 108 (90 Base) MCG/ACT inhaler USE 1-2 PUFFS EVERY 4-6 HOURS AS NEEDED  . ranitidine (ZANTAC) 150 MG tablet TAKE 1 TABLET BY MOUTH EVERY EVENING AT BEDTIME AS  NEEDED FOR HEARTBURN  . rosuvastatin (CRESTOR) 20 MG tablet Take 1 tablet (20 mg total) by mouth daily.  . sertraline (ZOLOFT) 100 MG tablet Take 1 tablet (100 mg total) by mouth daily.  . SYNTHROID 88 MCG tablet TAKE 1 TABLET DAILY  . tiotropium (SPIRIVA HANDIHALER) 18 MCG inhalation capsule Spiriva with HandiHaler 18 mcg and inhalation capsules  . traMADol (ULTRAM)   50 MG tablet Take 50 mg by mouth 3 (three) times daily as needed.  . triamcinolone cream (KENALOG) 0.1 % Apply 1 application daily as needed topically. Thin layer on vulva as needed  . [DISCONTINUED] Cholecalciferol (VITAMIN D) 2000 units CAPS Take 2,000 Units by mouth daily.  . [DISCONTINUED] ciprofloxacin (CIPRO) 500 MG tablet ciprofloxacin 500 mg tablet  . [DISCONTINUED] clindamycin (CLEOCIN T) 1 % external solution clindamycin phosphate 1 % topical solution  . [DISCONTINUED] fluconazole (DIFLUCAN) 100 MG tablet fluconazole 100 mg tablet  . [DISCONTINUED] glucose blood (FREESTYLE LITE) test strip Use as instructed once daily to check sugars, prediabetes  . [DISCONTINUED] levofloxacin (LEVAQUIN) 500 MG tablet levofloxacin 500 mg tablet  . [DISCONTINUED] metroNIDAZOLE (FLAGYL) 500 MG tablet metronidazole 500 mg tablet  . [DISCONTINUED] nystatin-triamcinolone (MYCOLOG II) cream nystatin-triamcinolone 100,000 unit/g-0.1 % topical cream  . [DISCONTINUED] SYNTHROID 88 MCG tablet TAKE 1 TABLET DAILY (Patient not taking: Reported on 05/13/2018)   No facility-administered encounter medications on file as of 05/13/2018.     Activities of Daily Living In your present state of health, do you have any difficulty performing the following activities: 05/13/2018  Hearing? N  Vision? N  Difficulty concentrating or making decisions? N  Walking or climbing stairs? N  Dressing or bathing? N  Doing errands, shopping? N  Preparing Food and eating ? N  Using the Toilet? N  In the past six months, have you accidently leaked urine? N  Do you have  problems with loss of bowel control? N  Managing your Medications? N  Managing your Finances? N  Housekeeping or managing your Housekeeping? N  Some recent data might be hidden    Patient Care Team: Binnie Rail, MD as PCP - General (Internal Medicine) Clarene Essex, MD (Gastroenterology) Izora Gala, MD as Consulting Physician (Otolaryngology) Lenon Oms, MD as Referring Physician (Obstetrics and Gynecology) Juanito Doom, MD as Consulting Physician (Pulmonary Disease) Curt Bears, MD as Consulting Physician (Oncology) Gavin Pound, MD as Consulting Physician (Rheumatology) Marilynne Halsted, MD as Referring Physician (Ophthalmology)    Assessment:   This is a routine wellness examination for Regan.  Exercise Activities and Dietary recommendations Current Exercise Habits: Home exercise routine, Type of exercise: walking, Time (Minutes): 40, Frequency (Times/Week): 3, Weekly Exercise (Minutes/Week): 120, Intensity: Mild, Exercise limited by: orthopedic condition(s)  Goals    . Patient Stated     Stay as healthy and as independent as possible.       Fall Risk Fall Risk  05/13/2018 05/12/2017 05/06/2016 02/26/2016 07/27/2014  Falls in the past year? _0     Depression Screen PHQ 2/9 Scores 05/13/2018 05/12/2017 05/06/2016 12/13/2014  PHQ - 2 Score 1 2 0 1  PHQ- 9 Score 5 6 - 4     Cognitive Function MMSE - Mini Mental State Exam 05/13/2018 10/28/2015  Orientation to time 5 5  Orientation to Place 5 5  Registration 3 3  Attention/ Calculation 3 0  Recall 1 3  Language- name 2 objects 2 2  Language- repeat 1 1  Language- follow 3 step command 3 3  Language- read & follow direction 1 1  Write a sentence 1 1  Copy design 1 1  Total score 26 25        Immunization History  Administered Date(s) Administered  . Influenza Split 09/28/2011, 09/28/2015  . Influenza Whole 09/27/2012  . Influenza, High Dose Seasonal PF 10/28/2016, 01/12/2017,  09/22/2017  . Influenza,inj,Quad PF,6+ Mos 09/20/2013, 09/12/2014  .  Pneumococcal Conjugate-13 10/24/2015  . Pneumococcal Polysaccharide-23 12/29/2007    Screening Tests Health Maintenance  Topic Date Due  . FOOT EXAM  09/25/1942  . OPHTHALMOLOGY EXAM  09/25/1942  . URINE MICROALBUMIN  09/25/1944  . TETANUS/TDAP  09/25/1953  . INFLUENZA VACCINE  07/28/2018  . DEXA SCAN  Completed  . PNA vac Low Risk Adult  Completed      Plan:     Hospice grief counseling resource provided to patient.  Continue doing brain stimulating activities (puzzles, reading, adult coloring books, staying active) to keep memory sharp.   Continue to eat heart healthy diet (full of fruits, vegetables, whole grains, lean protein, water--limit salt, fat, and sugar intake) and increase physical activity as tolerated.  I have personally reviewed and noted the following in the patient's chart:   . Medical and social history . Use of alcohol, tobacco or illicit drugs  . Current medications and supplements . Functional ability and status . Nutritional status . Physical activity . Advanced directives . List of other physicians . Vitals . Screenings to include cognitive, depression, and falls . Referrals and appointments  In addition, I have reviewed and discussed with patient certain preventive protocols, quality metrics, and best practice recommendations. A written personalized care plan for preventive services as well as general preventive health recommendations were provided to patient.     Michiel Cowboy, RN  05/13/2018

## 2018-05-13 ENCOUNTER — Telehealth: Payer: Self-pay | Admitting: Internal Medicine

## 2018-05-13 ENCOUNTER — Ambulatory Visit (INDEPENDENT_AMBULATORY_CARE_PROVIDER_SITE_OTHER): Payer: Medicare Other | Admitting: *Deleted

## 2018-05-13 ENCOUNTER — Other Ambulatory Visit: Payer: Self-pay | Admitting: *Deleted

## 2018-05-13 VITALS — BP 120/60 | HR 68 | Resp 18 | Ht 62.0 in | Wt 139.0 lb

## 2018-05-13 DIAGNOSIS — E119 Type 2 diabetes mellitus without complications: Secondary | ICD-10-CM

## 2018-05-13 DIAGNOSIS — Z0000 Encounter for general adult medical examination without abnormal findings: Secondary | ICD-10-CM | POA: Diagnosis not present

## 2018-05-13 MED ORDER — GLUCOSE BLOOD VI STRP: ORAL_STRIP | 3 refills | Status: DC

## 2018-05-13 NOTE — Patient Instructions (Addendum)
If you or someone you know has experienced the death of a loved one, the support of others can play an invaluable role in the healing process. [facility] offers grief and loss services for anyone in the community. Contact us today [phone]  Continue doing brain stimulating activities (puzzles, reading, adult coloring books, staying active) to keep memory sharp.   Continue to eat heart healthy diet (full of fruits, vegetables, whole grains, lean protein, water--limit salt, fat, and sugar intake) and increase physical activity as tolerated.   Lisa Vega , Thank you for taking time to come for your Medicare Wellness Visit. I appreciate your ongoing commitment to your health goals. Please review the following plan we discussed and let me know if I can assist you in the future.   These are the goals we discussed: Goals    . Patient Stated     Stay as healthy and as independent as possible.       This is a list of the screening recommended for you and due dates:  Health Maintenance  Topic Date Due  . Complete foot exam   09/25/1942  . Eye exam for diabetics  09/25/1942  . Urine Protein Check  09[birth year]25/1942  . Tetanus Vaccine  09/25/1953  . Flu Shot  07/28/2018  . DEXA scan (bone density measurement)  Completed  . Pneumonia vaccines  Completed   Health Maintenance, Female Adopting a healthy lifestyle and getting preventive care can go a long way to promote health and wellness. Talk with your health care provider about what schedule of regular examinations is right for you. This is a good chance for you to check in with your provider about disease prevention and staying healthy. In between checkups, there are plenty of things you can do on your own. Experts have done a lot of research about which lifestyle changes and preventive measures are most likely to keep you healthy. Ask your health care provider for more information. Weight and diet Eat a healthy  diet  Be sure to include plenty of vegetables, fruits, low-fat dairy products, and lean protein.  Do not eat a lot of foods high in solid fats, added sugars, or salt.  Get regular exercise. This is one of the most important things you can do for your health. ? Most adults should exercise for at least 150 minutes each week. The exercise should increase your heart rate and make you sweat (moderate-intensity exercise). ? Most adults should also do strengthening exercises at least twice a week. This is in addition to the moderate-intensity exercise.  Maintain a healthy weight  Body mass index (BMI) is a measurement that can be used to identify possible weight problems. It estimates body fat based on height and weight. Your health care provider can help determine your BMI and help you achieve or maintain a healthy weight.  For females 43 years of age and older: ? A BMI below 18.5 is considered underweight. ? A BMI of 18.5 to 24.9 is normal. ? A BMI of 25 to 29.9 is considered overweight. ? A BMI of 30 and above is considered obese.  Watch levels of cholesterol and blood lipids  You should start having your blood tested for lipids and cholesterol at 82 years of age, then have this test every 5 years.  You may need to have your cholesterol levels checked more often if: ? Your lipid or cholesterol levels are high. ? You are older than 82 years of age. ?  You are at high risk for heart disease.  Cancer screening Lung Cancer  Lung cancer screening is recommended for adults 70-108 years old who are at high risk for lung cancer because of a history of smoking.  A yearly low-dose CT scan of the lungs is recommended for people who: ? Currently smoke. ? Have quit within the past 15 years. ? Have at least a 30-pack-year history of smoking. A pack year is smoking an average of one pack of cigarettes a day for 1 year.  Yearly screening should continue until it has been 15 years since you  quit.  Yearly screening should stop if you develop a health problem that would prevent you from having lung cancer treatment.  Breast Cancer  Practice breast self-awareness. This means understanding how your breasts normally appear and feel.  It also means doing regular breast self-exams. Let your health care provider know about any changes, no matter how small.  If you are in your 20s or 30s, you should have a clinical breast exam (CBE) by a health care provider every 1-3 years as part of a regular health exam.  If you are 79 or older, have a CBE every year. Also consider having a breast X-ray (mammogram) every year.  If you have a family history of breast cancer, talk to your health care provider about genetic screening.  If you are at high risk for breast cancer, talk to your health care provider about having an MRI and a mammogram every year.  Breast cancer gene (BRCA) assessment is recommended for women who have family members with BRCA-related cancers. BRCA-related cancers include: ? Breast. ? Ovarian. ? Tubal. ? Peritoneal cancers.  Results of the assessment will determine the need for genetic counseling and BRCA1 and BRCA2 testing.  Cervical Cancer Your health care provider may recommend that you be screened regularly for cancer of the pelvic organs (ovaries, uterus, and vagina). This screening involves a pelvic examination, including checking for microscopic changes to the surface of your cervix (Pap test). You may be encouraged to have this screening done every 3 years, beginning at age 5.  For women ages 49-65, health care providers may recommend pelvic exams and Pap testing every 3 years, or they may recommend the Pap and pelvic exam, combined with testing for human papilloma virus (HPV), every 5 years. Some types of HPV increase your risk of cervical cancer. Testing for HPV may also be done on women of any age with unclear Pap test results.  Other health care providers  may not recommend any screening for nonpregnant women who are considered low risk for pelvic cancer and who do not have symptoms. Ask your health care provider if a screening pelvic exam is right for you.  If you have had past treatment for cervical cancer or a condition that could lead to cancer, you need Pap tests and screening for cancer for at least 20 years after your treatment. If Pap tests have been discontinued, your risk factors (such as having a new sexual partner) need to be reassessed to determine if screening should resume. Some women have medical problems that increase the chance of getting cervical cancer. In these cases, your health care provider may recommend more frequent screening and Pap tests.  Colorectal Cancer  This type of cancer can be detected and often prevented.  Routine colorectal cancer screening usually begins at 82 years of age and continues through 82 years of age.  Your health care provider may recommend screening  at an earlier age if you have risk factors for colon cancer.  Your health care provider may also recommend using home test kits to check for hidden blood in the stool.  A small camera at the end of a tube can be used to examine your colon directly (sigmoidoscopy or colonoscopy). This is done to check for the earliest forms of colorectal cancer.  Routine screening usually begins at age 70.  Direct examination of the colon should be repeated every 5-10 years through 82 years of age. However, you may need to be screened more often if early forms of precancerous polyps or small growths are found.  Skin Cancer  Check your skin from head to toe regularly.  Tell your health care provider about any new moles or changes in moles, especially if there is a change in a mole's shape or color.  Also tell your health care provider if you have a mole that is larger than the size of a pencil eraser.  Always use sunscreen. Apply sunscreen liberally and repeatedly  throughout the day.  Protect yourself by wearing long sleeves, pants, a wide-brimmed hat, and sunglasses whenever you are outside.  Heart disease, diabetes, and high blood pressure  High blood pressure causes heart disease and increases the risk of stroke. High blood pressure is more likely to develop in: ? People who have blood pressure in the high end of the normal range (130-139/85-89 mm Hg). ? People who are overweight or obese. ? People who are African American.  If you are 45-56 years of age, have your blood pressure checked every 3-5 years. If you are 79 years of age or older, have your blood pressure checked every year. You should have your blood pressure measured twice-once when you are at a hospital or clinic, and once when you are not at a hospital or clinic. Record the average of the two measurements. To check your blood pressure when you are not at a hospital or clinic, you can use: ? An automated blood pressure machine at a pharmacy. ? A home blood pressure monitor.  If you are between 47 years and 20 years old, ask your health care provider if you should take aspirin to prevent strokes.  Have regular diabetes screenings. This involves taking a blood sample to check your fasting blood sugar level. ? If you are at a normal weight and have a low risk for diabetes, have this test once every three years after 82 years of age. ? If you are overweight and have a high risk for diabetes, consider being tested at a younger age or more often. Preventing infection Hepatitis B  If you have a higher risk for hepatitis B, you should be screened for this virus. You are considered at high risk for hepatitis B if: ? You were born in a country where hepatitis B is common. Ask your health care provider which countries are considered high risk. ? Your parents were born in a high-risk country, and you have not been immunized against hepatitis B (hepatitis B vaccine). ? You have HIV or AIDS. ? You  use needles to inject street drugs. ? You live with someone who has hepatitis B. ? You have had sex with someone who has hepatitis B. ? You get hemodialysis treatment. ? You take certain medicines for conditions, including cancer, organ transplantation, and autoimmune conditions.  Hepatitis C  Blood testing is recommended for: ? Everyone born from [birth year] through 1963. ? Anyone with known risk factors  for hepatitis C.  Sexually transmitted infections (STIs)  You should be screened for sexually transmitted infections (STIs) including gonorrhea and chlamydia if: ? You are sexually active and are younger than 82 years of age. ? You are older than 82 years of age and your health care provider tells you that you are at risk for this type of infection. ? Your sexual activity has changed since you were last screened and you are at an increased risk for chlamydia or gonorrhea. Ask your health care provider if you are at risk.  If you do not have HIV, but are at risk, it may be recommended that you take a prescription medicine daily to prevent HIV infection. This is called pre-exposure prophylaxis (PrEP). You are considered at risk if: ? You are sexually active and do not regularly use condoms or know the HIV status of your partner(s). ? You take drugs by injection. ? You are sexually active with a partner who has HIV.  Talk with your health care provider about whether you are at high risk of being infected with HIV. If you choose to begin PrEP, you should first be tested for HIV. You should then be tested every 3 months for as long as you are taking PrEP. Pregnancy  If you are premenopausal and you may become pregnant, ask your health care provider about preconception counseling.  If you may become pregnant, take 400 to 800 micrograms (mcg) of folic acid every day.  If you want to prevent pregnancy, talk to your health care provider about birth control (contraception). Osteoporosis and  menopause  Osteoporosis is a disease in which the bones lose minerals and strength with aging. This can result in serious bone fractures. Your risk for osteoporosis can be identified using a bone density scan.  If you are 11 years of age or older, or if you are at risk for osteoporosis and fractures, ask your health care provider if you should be screened.  Ask your health care provider whether you should take a calcium or vitamin D supplement to lower your risk for osteoporosis.  Menopause may have certain physical symptoms and risks.  Hormone replacement therapy may reduce some of these symptoms and risks. Talk to your health care provider about whether hormone replacement therapy is right for you. Follow these instructions at home:  Schedule regular health, dental, and eye exams.  Stay current with your immunizations.  Do not use any tobacco products including cigarettes, chewing tobacco, or electronic cigarettes.  If you are pregnant, do not drink alcohol.  If you are breastfeeding, limit how much and how often you drink alcohol.  Limit alcohol intake to no more than 1 drink per day for nonpregnant women. One drink equals 12 ounces of beer, 5 ounces of Thelma Lorenzetti, or 1 ounces of hard liquor.  Do not use street drugs.  Do not share needles.  Ask your health care provider for help if you need support or information about quitting drugs.  Tell your health care provider if you often feel depressed.  Tell your health care provider if you have ever been abused or do not feel safe at home. This information is not intended to replace advice given to you by your health care provider. Make sure you discuss any questions you have with your health care provider. Document Released: 06/29/2011 Document Revised: 05/21/2016 Document Reviewed: 09/17/2015 Elsevier Interactive Patient Education  [employer].

## 2018-05-13 NOTE — Telephone Encounter (Signed)
Copied from [facility] [id number]. Topic: Quick Communication - Rx Refill/Question >> May 13, 2018  8:41 AM Bea Graff, NT wrote: Medication: glucose blood (FREESTYLE LITE) test strip Has the patient contacted their pharmacy? Yes.   (Agent: If no, request that the patient contact the pharmacy for the refill.) Preferred Pharmacy (with phone number or street name): [id number] - [place], [place] [phone] (Phone) [phone] (Fax)  Pt is out of test strips   Agent: Please be advised that RX refills may take up to 3 business days. We ask that you follow-up with your pharmacy.

## 2018-05-13 NOTE — Progress Notes (Signed)
Medical screening examination/treatment/procedure(s) were performed by non-physician practitioner and as supervising physician I was immediately available for consultation/collaboration. I agree with above. Jermari Tamargo A Infinity Jeffords, MD 

## 2018-05-13 NOTE — Telephone Encounter (Signed)
Rx refill for diabetic supplies

## 2018-05-16 ENCOUNTER — Other Ambulatory Visit: Payer: Self-pay | Admitting: Emergency Medicine

## 2018-05-16 MED ORDER — GLUCOSE BLOOD VI STRP: ORAL_STRIP | 1 refills | Status: DC

## 2018-05-31 ENCOUNTER — Telehealth: Payer: Self-pay | Admitting: Podiatry

## 2018-05-31 NOTE — Telephone Encounter (Signed)
lvm again on home number for pt to call to schedule an appt for orthotics per Dr Paulla Dolly.

## 2018-06-01 DIAGNOSIS — R42 Dizziness and giddiness: Secondary | ICD-10-CM | POA: Diagnosis not present

## 2018-06-01 DIAGNOSIS — J069 Acute upper respiratory infection, unspecified: Secondary | ICD-10-CM | POA: Diagnosis not present

## 2018-06-01 DIAGNOSIS — N3001 Acute cystitis with hematuria: Secondary | ICD-10-CM | POA: Diagnosis not present

## 2018-06-03 NOTE — Progress Notes (Signed)
This encounter was created in error - please disregard.

## 2018-06-29 IMAGING — DX DG CHEST 2V
2 series · 2 of 2 positions shown · non-contrast
Comparison: January 24, 2016

CLINICAL DATA: Cough and congestion with shortness of breath and
fever

EXAM:
CHEST  2 VIEW

[chest pa]
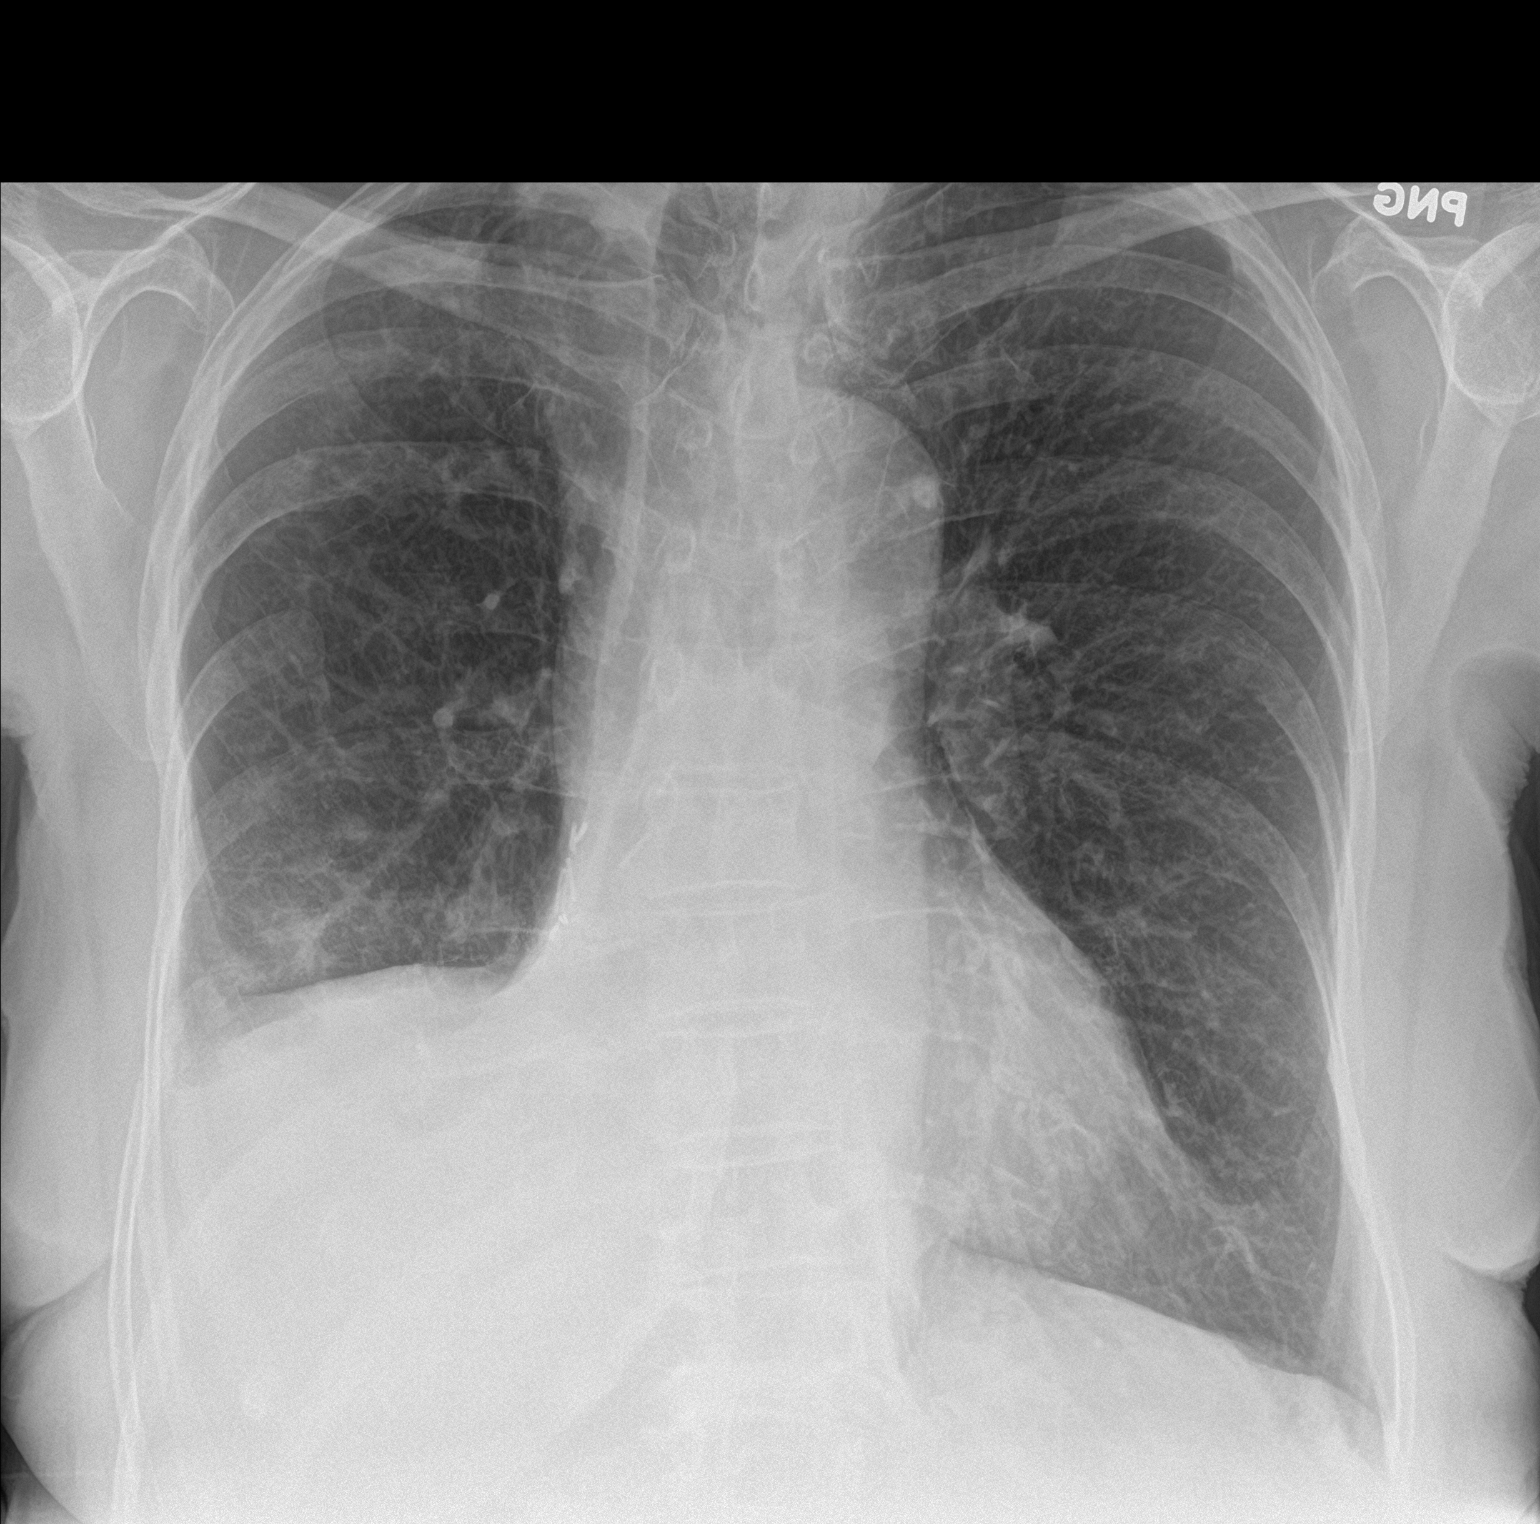

[chest lat]
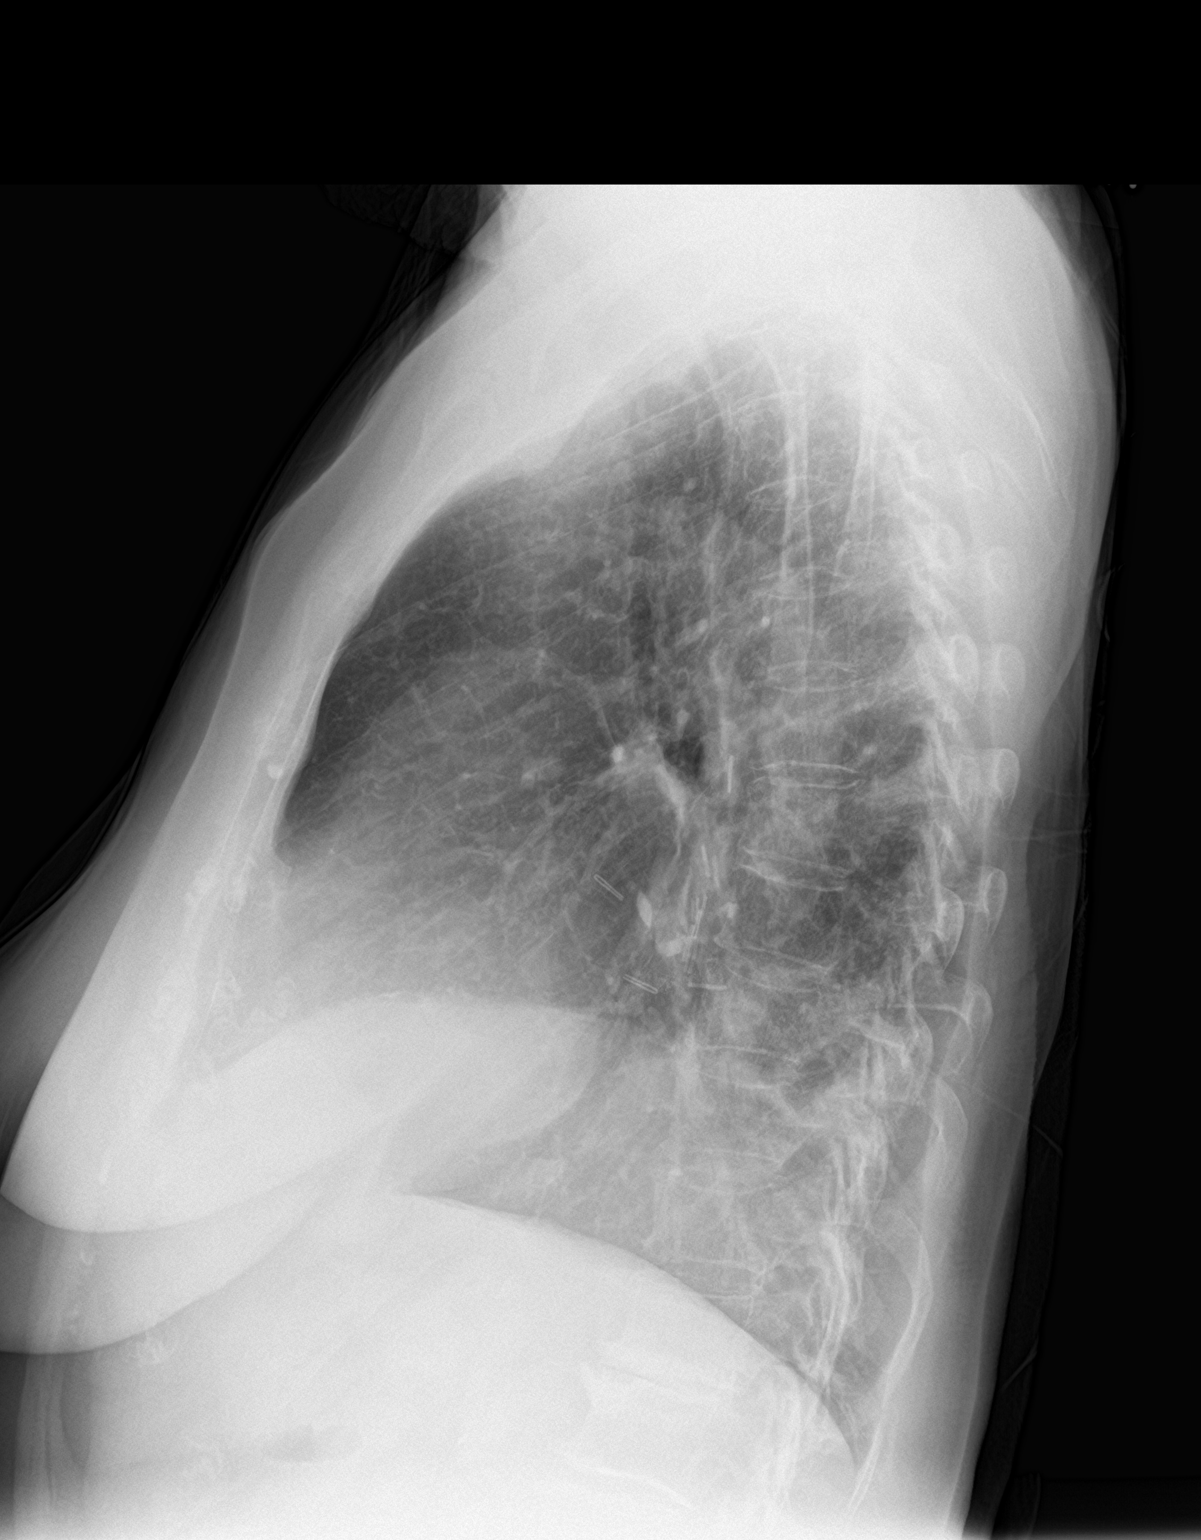

[2 of 2 positions shown; findings below may reference images not displayed]

FINDINGS: There is patchy infiltrate in the lateral right base. There is
postoperative change on the right with volume loss ; there is
chronic blunting of the right costophrenic angle with mild chronic
elevation of the right hemidiaphragm. There is scarring with apical
pleural thickening in each lung apex, stable. Heart size and
pulmonary vascularity are normal. No adenopathy. No bone lesions.
IMPRESSION: Patchy infiltrate lateral right base, felt to represent a small
focus of pneumonia. Postoperative change with volume loss on the
right. Apical scarring and pleural thickening bilaterally, more on
the right than on the left, stable. Left lung otherwise clear.
Stable cardiac silhouette.

Followup PA and lateral chest radiographs recommended in 3-4 weeks
following trial of antibiotic therapy to ensure resolution and
exclude underlying malignancy.

These results will be called to the ordering clinician or
representative by the Radiologist Assistant, and communication
documented in the PACS or zVision Dashboard.

## 2018-07-05 ENCOUNTER — Ambulatory Visit: Payer: Medicare Other | Admitting: Obstetrics & Gynecology

## 2018-07-13 ENCOUNTER — Ambulatory Visit (INDEPENDENT_AMBULATORY_CARE_PROVIDER_SITE_OTHER): Payer: Medicare Other | Admitting: Women's Health

## 2018-07-13 DIAGNOSIS — B373 Candidiasis of vulva and vagina: Secondary | ICD-10-CM | POA: Diagnosis not present

## 2018-07-13 DIAGNOSIS — Z4689 Encounter for fitting and adjustment of other specified devices: Secondary | ICD-10-CM | POA: Diagnosis not present

## 2018-07-13 DIAGNOSIS — B3731 Acute candidiasis of vulva and vagina: Secondary | ICD-10-CM

## 2018-07-13 DIAGNOSIS — N3000 Acute cystitis without hematuria: Secondary | ICD-10-CM | POA: Diagnosis not present

## 2018-07-13 DIAGNOSIS — N898 Other specified noninflammatory disorders of vagina: Secondary | ICD-10-CM | POA: Diagnosis not present

## 2018-07-13 LAB — WET PREP FOR TRICH, YEAST, CLUE

## 2018-07-13 MED ORDER — SULFAMETHOXAZOLE-TRIMETHOPRIM 800-160 MG PO TABS: 1.0000 | ORAL_TABLET | Freq: Two times a day (BID) | ORAL | 0 refills | Status: DC

## 2018-07-13 MED ORDER — FLUCONAZOLE 150 MG PO TABS: ORAL_TABLET | ORAL | 1 refills | Status: DC

## 2018-07-13 NOTE — Patient Instructions (Signed)

## 2018-07-13 NOTE — Progress Notes (Signed)
84 year old WWF G5, P3 presents with complaint of increased urinary frequency, questionable odor with urine, vaginal itching with irritation.  Was treated for a UTI 3 weeks ago with an antibiotic for 7 days and was given 1 Diflucan in [place] while visiting her son.  Denies abdominal pain, nausea or fever.  Having some right-sided low back pain.  Medical problems include anxiety/depression , hypothyroidism, COPD and history of lung cancer in remission.  Exam: Appears well.  No CVAT discomfort is in lower back, external genitalia mild erythema, wet prep positive for yeast, ring with support pessary removed, washed, speculum exam no erosion or irritation noted.  Pessary replaced with ease. UA: Trace protein, +2 leukocytes, 20-40 WBCs, 0-2 RBCs, 10-20 squamous epithelials, many bacteria  Probable UTI Yeast vaginitis Pessary maintenance  Plan: Options discussed, await urine culture results, would like to be treated today.  Septra twice daily for 3 days, Diflucan 150 p.o. today and repeat in 3 days if needed.  Prescriptions for both were given.  Reviewed importance of increasing water intake.  UTI prevention discussed.  Urine culture pending.  RTO 3 months for pessary maintenance or as needed.

## 2018-07-14 ENCOUNTER — Telehealth: Payer: Self-pay | Admitting: *Deleted

## 2018-07-14 MED ORDER — FLUCONAZOLE 150 MG PO TABS: ORAL_TABLET | ORAL | 1 refills | Status: DC

## 2018-07-14 MED ORDER — SULFAMETHOXAZOLE-TRIMETHOPRIM 800-160 MG PO TABS: 1.0000 | ORAL_TABLET | Freq: Two times a day (BID) | ORAL | 0 refills | Status: DC

## 2018-07-14 NOTE — Telephone Encounter (Signed)
Patient called stating her medication from [facility] on 07/13/18 "Septra twice daily for 3 days, Diflucan 150 p.o. today and repeat in 3 days if needed." was sent to mail order pharmacy, should be sent to local pharmacy. Rx sent.

## 2018-07-15 LAB — URINALYSIS, COMPLETE W/RFL CULTURE
Bilirubin Urine: NEGATIVE
Glucose, UA: NEGATIVE
Hgb urine dipstick: NEGATIVE
Hyaline Cast: NONE SEEN /LPF
Nitrites, Initial: NEGATIVE
Specific Gravity, Urine: 1.02 (ref 1.001–1.03)
pH: 5.5 (ref 5.0–8.0)

## 2018-07-15 LAB — URINE CULTURE
MICRO NUMBER:: 90851580
SPECIMEN QUALITY:: ADEQUATE

## 2018-07-15 LAB — CULTURE INDICATED

## 2018-07-21 DIAGNOSIS — M150 Primary generalized (osteo)arthritis: Secondary | ICD-10-CM | POA: Diagnosis not present

## 2018-07-21 DIAGNOSIS — R634 Abnormal weight loss: Secondary | ICD-10-CM | POA: Diagnosis not present

## 2018-07-21 DIAGNOSIS — R5382 Chronic fatigue, unspecified: Secondary | ICD-10-CM | POA: Diagnosis not present

## 2018-07-21 DIAGNOSIS — Z6822 Body mass index (BMI) 22.0-22.9, adult: Secondary | ICD-10-CM | POA: Diagnosis not present

## 2018-07-21 DIAGNOSIS — M0579 Rheumatoid arthritis with rheumatoid factor of multiple sites without organ or systems involvement: Secondary | ICD-10-CM | POA: Diagnosis not present

## 2018-07-21 DIAGNOSIS — Z79899 Other long term (current) drug therapy: Secondary | ICD-10-CM | POA: Diagnosis not present

## 2018-07-21 DIAGNOSIS — M2550 Pain in unspecified joint: Secondary | ICD-10-CM | POA: Diagnosis not present

## 2018-07-22 ENCOUNTER — Ambulatory Visit (INDEPENDENT_AMBULATORY_CARE_PROVIDER_SITE_OTHER)
Admission: RE | Admit: 2018-07-22 | Discharge: 2018-07-22 | Disposition: A | Discharge status: Home / Self Care | Payer: Medicare Other | Source: Ambulatory Visit | Attending: Nurse Practitioner | Admitting: Nurse Practitioner

## 2018-07-22 ENCOUNTER — Ambulatory Visit (INDEPENDENT_AMBULATORY_CARE_PROVIDER_SITE_OTHER): Payer: Medicare Other | Admitting: Nurse Practitioner

## 2018-07-22 ENCOUNTER — Ambulatory Visit: Payer: Self-pay | Admitting: Internal Medicine

## 2018-07-22 ENCOUNTER — Other Ambulatory Visit (INDEPENDENT_AMBULATORY_CARE_PROVIDER_SITE_OTHER): Payer: Medicare Other

## 2018-07-22 ENCOUNTER — Encounter: Payer: Self-pay | Admitting: Nurse Practitioner

## 2018-07-22 ENCOUNTER — Other Ambulatory Visit: Payer: Medicare Other

## 2018-07-22 VITALS — BP 140/84 | HR 75 | Temp 98.0°F | Resp 16 | Ht 62.0 in | Wt 128.0 lb

## 2018-07-22 DIAGNOSIS — R1030 Lower abdominal pain, unspecified: Secondary | ICD-10-CM

## 2018-07-22 DIAGNOSIS — R112 Nausea with vomiting, unspecified: Secondary | ICD-10-CM

## 2018-07-22 LAB — POCT URINALYSIS DIPSTICK
Bilirubin, UA: NEGATIVE
Blood, UA: NEGATIVE
Glucose, UA: NEGATIVE
Ketones, UA: NEGATIVE
Nitrite, UA: NEGATIVE
Protein, UA: POSITIVE — AB
Spec Grav, UA: 1.025 (ref 1.010–1.025)
Urobilinogen, UA: NEGATIVE E.U./dL — AB
pH, UA: 6 (ref 5.0–8.0)

## 2018-07-22 LAB — CBC
HCT: 41.1 % (ref 36.0–46.0)
Hemoglobin: 13.7 g/dL (ref 12.0–15.0)
MCHC: 33.3 g/dL (ref 30.0–36.0)
MCV: 91.7 fl (ref 78.0–100.0)
Platelets: 164 10*3/uL (ref 150.0–400.0)
RBC: 4.48 Mil/uL (ref 3.87–5.11)
RDW: 15.3 % (ref 11.5–15.5)
WBC: 5.8 10*3/uL (ref 4.0–10.5)

## 2018-07-22 LAB — COMPREHENSIVE METABOLIC PANEL
ALT: 11 U/L (ref 0–35)
AST: 16 U/L (ref 0–37)
Albumin: 3.6 g/dL (ref 3.5–5.2)
Alkaline Phosphatase: 87 U/L (ref 39–117)
BUN: 10 mg/dL (ref 6–23)
CO2: 33 mEq/L — ABNORMAL HIGH (ref 19–32)
Calcium: 9 mg/dL (ref 8.4–10.5)
Chloride: 102 mEq/L (ref 96–112)
Creatinine, Ser: 0.79 mg/dL (ref 0.40–1.20)
GFR: 73.72 mL/min (ref >60.00)
Glucose, Bld: 177 mg/dL — ABNORMAL HIGH (ref 70–99)
Potassium: 4.1 mEq/L (ref 3.5–5.1)
Sodium: 139 mEq/L (ref 135–145)
Total Bilirubin: 0.8 mg/dL (ref 0.2–1.2)
Total Protein: 6.9 g/dL (ref 6.0–8.3)

## 2018-07-22 MED ORDER — NITROFURANTOIN MONOHYD MACRO 100 MG PO CAPS: 100.0000 mg | ORAL_CAPSULE | Freq: Two times a day (BID) | ORAL | 0 refills | Status: DC

## 2018-07-22 NOTE — Telephone Encounter (Signed)
Daughter, Lisa Vega called in for her mother (pt).    Lisa Vega is not with Lisa Vega but they live together. Lisa Vega is c/o having abd pain in her right side that is radiating around into her back that started 2-3 days ago.  (She is being treated for a UTI by her GYN right now.   On antibiotics).    Pt also has diverticulitis per daughter.    Dr. Quay Burow is not in the office today so I scheduled her with Caesar Chestnut, NP-BC for today at 10:30.    Lisa Vega said she would call her mother now and let her know so she could make it on time to this appt.    Reason for Disposition . [1] MILD-MODERATE pain AND [2] constant AND [3] present > 2 hours  Answer Assessment - Initial Assessment Questions 1. LOCATION: "Where does it hurt?"      Right side abdominal  pain radiating into her back.    She is being treated for UTI by her GYN.    She has RA, and diverticulitis.   2. RADIATION: "Does the pain shoot anywhere else?" (e.g., chest, back)     Radiating into right side of back in middle of stomach and middle of her back. 3. ONSET: "When did the pain begin?" (e.g., minutes, hours or days ago)      3 mornings ago.   It's not terrible.   It's a 4-5 on the pain scale. 4. SUDDEN: "Gradual or sudden onset?"     Daughter is not with pt.    Pt lives with daughter.    5. PATTERN "Does the pain come and go, or is it constant?"    - If constant: "Is it getting better, staying the same, or worsening?"      (Note: Constant means the pain never goes away completely; most serious pain is constant and it progresses)     - If intermittent: "How long does it last?" "Do you have pain now?"     (Note: Intermittent means the pain goes away completely between bouts)     4-5 on pain scale. 6. SEVERITY: "How bad is the pain?"  (e.g., Scale 1-10; mild, moderate, or severe)   - MILD (1-3): doesn't interfere with normal activities, abdomen soft and not tender to touch    - MODERATE (4-7): interferes with normal activities or  awakens from sleep, tender to touch    - SEVERE (8-10): excruciating pain, doubled over, unable to do any normal activities      Not excruciating 7. RECURRENT SYMPTOM: "Have you ever had this type of abdominal pain before?" If so, ask: "When was the last time?" and "What happened that time?"      No.   With RA she has pain almost every day in different places. 8. CAUSE: "What do you think is causing the abdominal pain?"     No.   She hasn't mentioned anything. 9. RELIEVING/AGGRAVATING FACTORS: "What makes it better or worse?" (e.g., movement, antacids, bowel movement)     Chronic constipation.    Nothing different than normal. 10. OTHER SYMPTOMS: "Has there been any vomiting, diarrhea, constipation, or urine problems?"       This morning she felt nauseas.    She's eating and drinking. 11. PREGNANCY: "Is there any chance you are pregnant?" "When was your last menstrual period?"       Not asked due to age  Protocols used: ABDOMINAL PAIN - [facility]

## 2018-07-22 NOTE — Progress Notes (Signed)
Name: Lisa Vega   MRN: [id number]    DOB: 1932-12-05   Date:07/22/2018       Progress Note  Subjective  Chief Complaint  Chief Complaint  Patient presents with  . Abdominal Pain    RLQ pain that radiates to back x3 days, having nausea as well,     HPI  Lisa Vega is here today for evaluation of acute abdominal pain. The pain began about 3 days ago, describes as aching pain to bilateral lower abdomen and radiating around her right side into her right lower back. The pain is constant. She reports malaise, decreased appetite, nausea, two episodes of vomiting and dry heaves. She reports constipation but says this is chronic for her. She denies fevers, chills, chest pain, shortness of breath, dysuria, hematuria, urinary frequency, vaginal bleeding, diarrhea, rectal bleeding. She says she has not been sleeping well due to the pain. Of note, she did have pessary removed, cleaned and replaced in her GYN office the day before her symptoms began   Patient Active Problem List   Diagnosis Date Noted  . BPV (benign positional vertigo) 03/10/2018  . History of lung cancer 11/29/2017  . Difficulty sleeping 09/22/2017  . Diabetes ([place]) 05/13/2017  . Fatigue 05/12/2017  . CAP (community acquired pneumonia) 05/03/2017  . Orthopnea 01/12/2017  . GERD (gastroesophageal reflux disease) 11/09/2016  . Osteoporosis 05/06/2016  . Depression with anxiety 05/06/2016  . Hiatal hernia 05/06/2016  . Hypothyroidism 05/06/2016  . Tremor 10/28/2015  . Memory difficulties 10/28/2015  . Hoarseness 10/14/2015  . History of vitamin D deficiency 05/02/2015  . Other constipation 01/29/2015  . Abdominal pain, chronic, epigastric 11/01/2014  . 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 ([place]) 05/30/2014  . Diverticulitis of colon without hemorrhage 08/07/2013  . Prolapse of female pelvic organs 06/22/2013  . Neck pain 03/10/2013  . Vaginal atrophy 03/09/2013  . Rectocele 03/09/2013  . Cystocele  03/09/2013  . Female pelvic pain 03/09/2013  . Urinary incontinence 03/09/2013  . COPD (chronic obstructive pulmonary disease) ([place]) 01/16/2011  . CARCINOMA, LUNG, NONSMALL CELL 12/17/2010  . Rheumatoid arthritis ([place]) 12/17/2010  . Dyspnea on exertion 12/17/2010    Social History   Tobacco Use  . Smoking status: Former Smoker    Packs/day: 1.00    Years: 20.00    Pack years: 20.00    Types: Cigarettes    Last attempt to quit: 11/10/2004    Years since quitting: 13.7  . Smokeless tobacco: Never Used  Substance Use Topics  . Alcohol use: Yes    Alcohol/week: 0.6 oz    Types: 1 Glasses of wine per week    Comment: socially     Current Outpatient Medications:  .  acetaminophen (TYLENOL) 325 MG tablet, Take 650 mg by mouth every 6 (six) hours as needed., Disp: , Rfl:  .  ALPRAZolam (XANAX) 0.25 MG tablet, TAKE 1 TABLET BY MOUTH DAILY AS NEEDED FOR ANXIETY, Disp: 20 tablet, Rfl: 1 .  blood glucose meter kit and supplies KIT, Dispense based on patient and insurance preference. Use up to four times daily as directed. (FOR E11.9)., Disp: 1 each, Rfl: 0 .  budesonide-formoterol (SYMBICORT) 160-4.5 MCG/ACT inhaler, 2 puffs BID, Disp: 1 Inhaler, Rfl: 0 .  fluconazole (DIFLUCAN) 150 MG tablet, Take one tablet today and repeat in 3 days, Disp: 2 tablet, Rfl: 1 .  folic acid (FOLVITE) 1 MG tablet, Take 1 mg by mouth daily., Disp: , Rfl:  .  gabapentin (NEURONTIN) 100 MG capsule, Take  2 capsules (200 mg total) by mouth 3 (three) times daily., Disp: 540 capsule, Rfl: 1 .  glucose blood (ONE TOUCH ULTRA TEST) test strip, Use to test blood sugars up to 4 times daily., Disp: 400 each, Rfl: 1 .  leflunomide (ARAVA) 20 MG tablet, leflunomide 20 mg tablet, Disp: , Rfl:  .  linaclotide (LINZESS) 72 MCG capsule, Linzess 72 mcg capsule, Disp: , Rfl:  .  meclizine (ANTIVERT) 25 MG tablet, Take 0.5-1 tablets (12.5-25 mg total) by mouth 3 (three) times daily as needed for dizziness., Disp: 30 tablet, Rfl:  5 .  NONFORMULARY OR COMPOUNDED ITEM, Estradiol .02% 1 ML Prefilled Applicator Sig: apply vaginally twice a week #90 Day Supply with 4 refills, Disp: 1 each, Rfl: 0 .  ONETOUCH DELICA LANCETS 44H MISC, USE TO CHECK SUGARS UP TO 4 TIMES A DAY, Disp: , Rfl: 0 .  potassium chloride SA (K-DUR,KLOR-CON) 20 MEQ tablet, Take 1 tablet (20 mEq total) by mouth daily., Disp: 30 tablet, Rfl: 2 .  predniSONE (DELTASONE) 20 MG tablet, prednisone 20 mg tablet, Disp: , Rfl:  .  PROAIR HFA 108 (90 Base) MCG/ACT inhaler, USE 1-2 PUFFS EVERY 4-6 HOURS AS NEEDED, Disp: 8.5 g, Rfl: 1 .  ranitidine (ZANTAC) 150 MG tablet, TAKE 1 TABLET BY MOUTH EVERY EVENING AT BEDTIME AS NEEDED FOR HEARTBURN, Disp: 90 tablet, Rfl: 3 .  sertraline (ZOLOFT) 100 MG tablet, Take 1 tablet (100 mg total) by mouth daily., Disp: 90 tablet, Rfl: 1 .  sulfamethoxazole-trimethoprim (BACTRIM DS) 800-160 MG tablet, Take 1 tablet by mouth 2 (two) times daily., Disp: 6 tablet, Rfl: 0 .  SYNTHROID 88 MCG tablet, TAKE 1 TABLET DAILY, Disp: 90 tablet, Rfl: 1 .  tiotropium (SPIRIVA HANDIHALER) 18 MCG inhalation capsule, Spiriva with HandiHaler 18 mcg and inhalation capsules, Disp: , Rfl:  .  traMADol (ULTRAM) 50 MG tablet, Take 50 mg by mouth 3 (three) times daily as needed., Disp: , Rfl: 0 .  triamcinolone cream (KENALOG) 0.1 %, Apply 1 application daily as needed topically. Thin layer on vulva as needed, Disp: 30 g, Rfl: 3 .  rosuvastatin (CRESTOR) 20 MG tablet, Take 1 tablet (20 mg total) by mouth daily., Disp: 30 tablet, Rfl: 11  Allergies  Allergen Reactions  . Aspirin   . Codeine     REACTION: hallucinations  . Demerol   . Meperidine Hcl     REACTION: severe GI upset  . Meperidine Hcl     ROS   No other specific complaints in a complete review of systems (except as listed in HPI above).  Objective  Vitals:   07/22/18 1039  BP: 140/84  Pulse: 75  Resp: 16  Temp: 98 F (36.7 C)  TempSrc: Oral  SpO2: 96%  Weight: 128 lb (58.1  kg)  Height: '5\' 2"'  (1.575 m)    Body mass index is 23.41 kg/m.  Nursing Note and Vital Signs reviewed.  Physical Exam  Constitutional: Patient appears well-developed and well-nourished.  No distress.  HEENT: head atraumatic, normocephalic, pupils equal and reactive to light, EOM's intact, neck supple, oropharynx pink and moist without exudate Cardiovascular: Normal rate, regular rhythm, distal pulses intact. Pulmonary/Chest: Effort normal and breath sounds clear. No respiratory distress or retractions. Abdominal: Soft, no distension, bowel sounds present x4 quadrants.  There is right sided abdominal and CVA tenderness. No left sided abdominal or CVA tenderness Neurological: She is alert and oriented to person, place, and time. No cranial nerve deficit. Coordination, balance, strength, speech and  gait are normal.  Skin: Skin is warm and dry. No rash noted. No erythema.  Psychiatric: Patient has a normal mood and affect. behavior is normal. Judgment and thought content normal.   Assessment & Plan   Lower abdominal pain, Nausea and vomiting, intractability of vomiting not specified, unspecified vomiting type CBC, CMET, DG ABD 1 view, POCT urinalysis in office today did not indicate acute process or infection Could be viral but will initiate course of macrobid for possible UTI while we wait for urine culture to return- dosing and side effects discussed  -home management of constipation, Red flags and when to present for emergency care or RTC reviewed with patient at time of visit. Follow up and care instructions discussed and provided in AVS - CBC; Future - Comprehensive metabolic panel; Future - DG Abd 1 View; Future - POCT urinalysis dipstick - Urine Culture; Future - nitrofurantoin, macrocrystal-monohydrate, (MACROBID) 100 MG capsule; Take 1 capsule (100 mg total) by mouth 2 (two) times daily.  Dispense: 10 capsule; Refill: 0

## 2018-07-22 NOTE — Patient Instructions (Addendum)
Your lab work was normal. Xray showed some constipation. Urine does not show clear infection but I am going to go ahead and start treatment for a UTI while we wait for your urine culture to return - macrobid 100mg  twice daily for 5 days.  I will let you know when urine culture returns   Constipation, Adult Constipation is when a person:  Poops (has a bowel movement) fewer times in a week than normal.  Has a hard time pooping.  Has poop that is dry, hard, or bigger than normal.  Follow these instructions at home: Eating and drinking   Eat foods that have a lot of fiber, such as: ? Fresh fruits and vegetables. ? Whole grains. ? Beans.  Eat less of foods that are high in fat, low in fiber, or overly processed, such as: ? [place] fries. ? Hamburgers. ? Cookies. ? Candy. ? Soda.  Drink enough fluid to keep your pee (urine) clear or pale yellow. General instructions  Exercise regularly or as told by your doctor.  Go to the restroom when you feel like you need to poop. Do not hold it in.  Take over-the-counter and prescription medicines only as told by your doctor. These include any fiber supplements.  Do pelvic floor retraining exercises, such as: ? Doing deep breathing while relaxing your lower belly (abdomen). ? Relaxing your pelvic floor while pooping.  Watch your condition for any changes.  Keep all follow-up visits as told by your doctor. This is important. Contact a doctor if:  You have pain that gets worse.  You have a fever.  You have not pooped for 4 days.  You throw up (vomit).  You are not hungry.  You lose weight.  You are bleeding from the anus.  You have thin, pencil-like poop (stool). Get help right away if:  You have a fever, and your symptoms suddenly get worse.  You leak poop or have blood in your poop.  Your belly feels hard or bigger than normal (is bloated).  You have very bad belly pain.  You feel dizzy or you faint. This  information is not intended to replace advice given to you by your health care provider. Make sure you discuss any questions you have with your health care provider. Document Released: 06/01/2008 Document Revised: 07/03/2016 Document Reviewed: 06/03/2016 Elsevier Interactive Patient Education  2018 [employer].

## 2018-07-23 LAB — URINE CULTURE
MICRO NUMBER:: 90886675
SPECIMEN QUALITY:: ADEQUATE

## 2018-07-25 ENCOUNTER — Other Ambulatory Visit: Payer: Self-pay | Admitting: Internal Medicine

## 2018-08-19 ENCOUNTER — Encounter: Payer: Self-pay | Admitting: Pulmonary Disease

## 2018-08-19 ENCOUNTER — Ambulatory Visit (INDEPENDENT_AMBULATORY_CARE_PROVIDER_SITE_OTHER): Payer: Medicare Other | Admitting: Pulmonary Disease

## 2018-08-19 VITALS — BP 128/70 | HR 62 | Ht 63.0 in | Wt 130.0 lb

## 2018-08-19 DIAGNOSIS — J849 Interstitial pulmonary disease, unspecified: Secondary | ICD-10-CM

## 2018-08-19 DIAGNOSIS — J449 Chronic obstructive pulmonary disease, unspecified: Secondary | ICD-10-CM | POA: Diagnosis not present

## 2018-08-19 LAB — PULMONARY FUNCTION TEST
DL/VA % pred: 72 %
DL/VA: 3.4 ml/min/mmHg/L
DLCO cor % pred: 47 %
DLCO cor: 10.96 ml/min/mmHg
DLCO unc % pred: 48 %
DLCO unc: 11.06 ml/min/mmHg
FEF 25-75 Post: 0.68 L/sec
FEF 25-75 Pre: 0.55 L/sec
FEF2575-%Change-Post: 22 %
FEF2575-%Pred-Post: 58 %
FEF2575-%Pred-Pre: 48 %
FEV1-%Change-Post: 6 %
FEV1-%Pred-Post: 75 %
FEV1-%Pred-Pre: 70 %
FEV1-Post: 1.28 L
FEV1-Pre: 1.21 L
FEV1FVC-%Change-Post: 2 %
FEV1FVC-%Pred-Pre: 83 %
FEV6-%Change-Post: 3 %
FEV6-%Pred-Post: 94 %
FEV6-%Pred-Pre: 90 %
FEV6-Post: 2.06 L
FEV6-Pre: 1.98 L
FEV6FVC-%Change-Post: 0 %
FEV6FVC-%Pred-Post: 106 %
FEV6FVC-%Pred-Pre: 105 %
FVC-%Change-Post: 3 %
FVC-%Pred-Post: 88 %
FVC-%Pred-Pre: 85 %
FVC-Post: 2.06 L
FVC-Pre: 1.99 L
Post FEV1/FVC ratio: 62 %
Post FEV6/FVC ratio: 100 %
Pre FEV1/FVC ratio: 61 %
Pre FEV6/FVC Ratio: 99 %
RV % pred: 103 %
RV: 2.51 L
TLC % pred: 91 %
TLC: 4.49 L

## 2018-08-19 MED ORDER — TIOTROPIUM BROMIDE MONOHYDRATE 2.5 MCG/ACT IN AERS: 2.0000 | INHALATION_SPRAY | Freq: Every day | RESPIRATORY_TRACT | 0 refills | Status: DC

## 2018-08-19 NOTE — Addendum Note (Signed)
Addended by: Dolores Lory on: 08/19/2018 01:49 PM   Modules accepted: Orders

## 2018-08-19 NOTE — Progress Notes (Signed)
Subjective:    Patient ID: Lisa Vega, female    DOB: August 29, 1932, 84 y.o.   MRN: [id number]  Synopsis: Former patient of Dr. Gwenette Vega with COPD in addition history of lung cancer status post right lower lobectomy in 2005.  Also has rheumatoid arthritis and has been on methotrexate since 1986.  Recurrent pneumonia in 2018.  She smoked from her 41's until the year 2005.     HPI Chief Complaint  Patient presents with  . Follow-up    PFT today, pain in left neck/shoulder and around chest    Lisa Vega says taht her breathing bothers her at times.  She says that the humidity will really bother her and it is hard for her to get around. She feels fatigued all the time.    No bronchitis or pneumonia.  She says that sometimes she feels like she has tightness in her chest and dyspnea, sometimes this is much.    She is still taking symbicort routinely.  She uses proAir sparingly.  She takes spiriva sparingly.    Past Medical History:  Diagnosis Date  . BPV (benign positional vertigo) 03/10/2018  . Cancer ([place])    lung ca  . COPD (chronic obstructive pulmonary disease) ([place])   . Dyspnea   . GERD (gastroesophageal reflux disease)   . Hypercholesteremia   . Memory difficulties 10/28/2015  . Rheumatoid arthritis(714.0)   . Thyroid disease   . Tremor 10/28/2015   Jaw tremor  . Uterine prolapse   . Vitamin D deficiency       Review of Systems  Constitutional: Negative for chills, fatigue and fever.  HENT: Negative for postnasal drip, rhinorrhea and sinus pressure.   Respiratory: Positive for shortness of breath. Negative for cough and wheezing.   Cardiovascular: Negative for chest pain, palpitations and leg swelling.       Objective:   Physical Exam Vitals:   08/19/18 1319  BP: 128/70  Pulse: 62  SpO2: 96%  Weight: 130 lb (59 kg)  Height: _0  (1.6 m)    RA  Gen: well appearing HENT: OP clear, TM's clear, neck supple PULM: Few upper airway wheezes B, normal percussion CV:  RRR, no mgr, trace edema GI: BS+, soft, nontender Derm: no cyanosis or rash Psyche: normal mood and affect    PFT PFT's 2013:  FEV1 1.49 (81%), ratio 60, TLC 86%, DLCO 65% (stable to better from 2007) January 2019 ratio 63%, FEV1 1.36 L 78% predicted, FVC 2.15 L 91% predicted, total lung capacity 4.51 L 92% predicted, DLCO 11.5 50% predicted Aug 2019: Ratio 61%, FEV1 1.28 L 75% predicted, forced vital capacity 2.06 L 88% predicted, total lung capacity 4.49 L 91% predicted, DLCO 11.1 mL 48% predicted  Chest imaging: CXR 12/2011:  No acute process. August 2018 CT chest: Right upper lobe pneumonia and bronchiectasis noted, faint nodule suggestive of hypersensitivity pneumonitis versus RBI LD, other nodules present status post right lower lobectomy, images independently reviewed December 2018 high-resolution CT scan of the chest images independently reviewed  Other imaging: 07/2017 SLP eval: no aspiration 09/2017 Esophogram: IMPRESSION:, No laryngeal penetration, likely chronic reflux, mild presbyesophagus, no stricture a small reducible hiatal hernia no obstruction to liquid or barium tablet   2013 echocardiogram LVEF 50-55%, normal valvular dysfunction, abnormal septal motion consistent with bundle branch block  BMET    Component Value Date/Time   NA 139 07/22/2018 1133   NA 139 08/10/2011 1457   K 4.1 07/22/2018 1133   K 4.4 08/10/2011  1457   CL 102 07/22/2018 1133   CL 98 08/10/2011 1457   CO2 33 (H) 07/22/2018 1133   CO2 29 08/10/2011 1457   GLUCOSE 177 (H) 07/22/2018 1133   GLUCOSE 98 08/10/2011 1457   BUN 10 07/22/2018 1133   BUN 15 08/10/2011 1457   CREATININE 0.79 07/22/2018 1133   CREATININE 0.9 08/10/2011 1457   CALCIUM 9.0 07/22/2018 1133   CALCIUM 8.9 08/10/2011 1457   GFRNONAA 66 (L) 01/15/2014 1817   GFRAA 77 (L) 01/15/2014 1817        Assessment & Plan:    ILD (interstitial lung disease) ([place])  COPD with chronic bronchitis and emphysema  ([place])  Discussion: This has been a stable interval for 8 up.  She has not had an exacerbation of her COPD or pneumonia since the last visit.  Today's lung function test showed no evidence of progression of the nondescript interstitial changes in the bases of her lungs.  As stated previously I believe that this scarring is due to chronic aspiration.  It sounds as if she has about one exacerbation of COPD per year.  I explained to her today that if she use this Spiriva more regularly this would help prevent this.  She is interested in trying a different delivery of Spiriva.  Plan: COPD: Continue Symbicort twice a day Continue Spiriva daily, try using the Respimat form, sample given today High-dose flu shot when available Practice good hand hygiene Stay active  Scarring in the bases of your lungs: This is due to silent chronic aspiration as we discussed today.  Fortunately today's lung function test did not show progression  We will see you back in 6 months or sooner if needed   Current Outpatient Medications:  .  acetaminophen (TYLENOL) 325 MG tablet, Take 650 mg by mouth every 6 (six) hours as needed., Disp: , Rfl:  .  ALPRAZolam (XANAX) 0.25 MG tablet, TAKE 1 TABLET BY MOUTH DAILY AS NEEDED FOR ANXIETY, Disp: 20 tablet, Rfl: 1 .  blood glucose meter kit and supplies KIT, Dispense based on patient and insurance preference. Use up to four times daily as directed. (FOR E11.9)., Disp: 1 each, Rfl: 0 .  budesonide-formoterol (SYMBICORT) 160-4.5 MCG/ACT inhaler, 2 puffs BID, Disp: 1 Inhaler, Rfl: 0 .  folic acid (FOLVITE) 1 MG tablet, Take 1 mg by mouth daily., Disp: , Rfl:  .  gabapentin (NEURONTIN) 100 MG capsule, Take 2 capsules (200 mg total) by mouth 3 (three) times daily., Disp: 540 capsule, Rfl: 1 .  glucose blood (ONE TOUCH ULTRA TEST) test strip, Use to test blood sugars up to 4 times daily., Disp: 400 each, Rfl: 1 .  leflunomide (ARAVA) 20 MG tablet, leflunomide 20 mg tablet, Disp: ,  Rfl:  .  linaclotide (LINZESS) 72 MCG capsule, Linzess 72 mcg capsule, Disp: , Rfl:  .  meclizine (ANTIVERT) 25 MG tablet, Take 0.5-1 tablets (12.5-25 mg total) by mouth 3 (three) times daily as needed for dizziness., Disp: 30 tablet, Rfl: 5 .  methotrexate 50 MG/2ML injection, INJECT 0.9 ML SUBCUTANEOUS WEEKLY ON THE SAME DAY FOR 30 DAYS, Disp: , Rfl: 1 .  NONFORMULARY OR COMPOUNDED ITEM, Estradiol .02% 1 ML Prefilled Applicator Sig: apply vaginally twice a week #90 Day Supply with 4 refills, Disp: 1 each, Rfl: 0 .  ONETOUCH DELICA LANCETS 29J MISC, USE TO CHECK SUGARS UP TO 4 TIMES A DAY, Disp: , Rfl: 0 .  potassium chloride SA (K-DUR,KLOR-CON) 20 MEQ tablet, Take 1 tablet (  20 mEq total) by mouth daily., Disp: 30 tablet, Rfl: 2 .  PROAIR HFA 108 (90 Base) MCG/ACT inhaler, USE 1-2 PUFFS EVERY 4-6 HOURS AS NEEDED, Disp: 8.5 g, Rfl: 1 .  ranitidine (ZANTAC) 150 MG tablet, TAKE 1 TABLET BY MOUTH EVERY EVENING AT BEDTIME AS NEEDED FOR HEARTBURN, Disp: 90 tablet, Rfl: 3 .  sertraline (ZOLOFT) 100 MG tablet, TAKE 1 TABLET BY MOUTH EVERY DAY, Disp: 90 tablet, Rfl: 1 .  SYNTHROID 88 MCG tablet, TAKE 1 TABLET DAILY, Disp: 90 tablet, Rfl: 1 .  tiotropium (SPIRIVA HANDIHALER) 18 MCG inhalation capsule, Spiriva with HandiHaler 18 mcg and inhalation capsules, Disp: , Rfl:  .  traMADol (ULTRAM) 50 MG tablet, Take 50 mg by mouth 3 (three) times daily as needed., Disp: , Rfl: 0 .  triamcinolone cream (KENALOG) 0.1 %, Apply 1 application daily as needed topically. Thin layer on vulva as needed, Disp: 30 g, Rfl: 3 .  fluconazole (DIFLUCAN) 150 MG tablet, Take one tablet today and repeat in 3 days (Patient not taking: Reported on 08/19/2018), Disp: 2 tablet, Rfl: 1 .  nitrofurantoin, macrocrystal-monohydrate, (MACROBID) 100 MG capsule, Take 1 capsule (100 mg total) by mouth 2 (two) times daily. (Patient not taking: Reported on 08/19/2018), Disp: 10 capsule, Rfl: 0 .  predniSONE (DELTASONE) 20 MG tablet, prednisone 20 mg  tablet, Disp: , Rfl:  .  rosuvastatin (CRESTOR) 20 MG tablet, Take 1 tablet (20 mg total) by mouth daily., Disp: 30 tablet, Rfl: 11 .  sulfamethoxazole-trimethoprim (BACTRIM DS) 800-160 MG tablet, Take 1 tablet by mouth 2 (two) times daily. (Patient not taking: Reported on 08/19/2018), Disp: 6 tablet, Rfl: 0

## 2018-08-19 NOTE — Progress Notes (Signed)
PFT done today. 

## 2018-08-19 NOTE — Patient Instructions (Signed)
COPD: Continue Symbicort twice a day Continue Spiriva daily, try using the Respimat form, sample given today High-dose flu shot when available Practice good hand hygiene Stay active  Scarring in the bases of your lungs: This is due to silent chronic aspiration as we discussed today.  Fortunately today's lung function test did not show progression  We will see you back in 6 months or sooner if needed

## 2018-08-23 ENCOUNTER — Other Ambulatory Visit: Payer: Self-pay | Admitting: Gastroenterology

## 2018-08-23 ENCOUNTER — Ambulatory Visit
Admission: RE | Admit: 2018-08-23 | Discharge: 2018-08-23 | Disposition: A | Discharge status: Home / Self Care | Payer: Medicare Other | Source: Ambulatory Visit | Attending: Gastroenterology | Admitting: Gastroenterology

## 2018-08-23 DIAGNOSIS — J440 Chronic obstructive pulmonary disease with acute lower respiratory infection: Principal | ICD-10-CM

## 2018-08-23 DIAGNOSIS — R1310 Dysphagia, unspecified: Secondary | ICD-10-CM | POA: Diagnosis not present

## 2018-08-23 DIAGNOSIS — K5901 Slow transit constipation: Secondary | ICD-10-CM | POA: Diagnosis not present

## 2018-08-23 DIAGNOSIS — K219 Gastro-esophageal reflux disease without esophagitis: Secondary | ICD-10-CM | POA: Diagnosis not present

## 2018-08-23 DIAGNOSIS — R05 Cough: Secondary | ICD-10-CM | POA: Diagnosis not present

## 2018-08-23 DIAGNOSIS — J209 Acute bronchitis, unspecified: Secondary | ICD-10-CM

## 2018-08-25 ENCOUNTER — Telehealth: Payer: Self-pay | Admitting: Neurology

## 2018-08-25 DIAGNOSIS — I6523 Occlusion and stenosis of bilateral carotid arteries: Secondary | ICD-10-CM

## 2018-08-25 DIAGNOSIS — G4489 Other headache syndrome: Secondary | ICD-10-CM

## 2018-08-25 DIAGNOSIS — H814 Vertigo of central origin: Secondary | ICD-10-CM

## 2018-08-25 NOTE — Telephone Encounter (Signed)
Pt requesting a call stating for the past few days she has had a headache that has interfered with her eyesight. Also stating she has been "off balance". Would like to know if Dr. Jannifer Franklin saw anything in her last MRI that would cause this. Please advise

## 2018-08-25 NOTE — Telephone Encounter (Signed)
I called the patient.  The patient has a long-standing history of headaches, she is having some headaches now associated with some mild visual blurring and mild dizziness, sensation of imbalance.  Prior MRI of the brain did not show extensive white matter changes.  The patient may be having migraine, but given her carotid artery disease, we will check a CT angiogram of the head and neck.

## 2018-08-25 NOTE — Telephone Encounter (Signed)
Medicare/BCBS fed order sent to GI. No auth they will reach out to the pt to schedule.

## 2018-08-30 ENCOUNTER — Encounter: Payer: Self-pay | Admitting: Cardiovascular Disease

## 2018-08-30 ENCOUNTER — Ambulatory Visit (INDEPENDENT_AMBULATORY_CARE_PROVIDER_SITE_OTHER): Payer: Medicare Other | Admitting: Cardiovascular Disease

## 2018-08-30 VITALS — BP 110/60 | HR 66 | Ht 63.5 in | Wt 130.0 lb

## 2018-08-30 DIAGNOSIS — I779 Disorder of arteries and arterioles, unspecified: Secondary | ICD-10-CM | POA: Diagnosis not present

## 2018-08-30 DIAGNOSIS — I6523 Occlusion and stenosis of bilateral carotid arteries: Secondary | ICD-10-CM

## 2018-08-30 DIAGNOSIS — I428 Other cardiomyopathies: Secondary | ICD-10-CM

## 2018-08-30 DIAGNOSIS — E785 Hyperlipidemia, unspecified: Secondary | ICD-10-CM

## 2018-08-30 DIAGNOSIS — I739 Peripheral vascular disease, unspecified: Principal | ICD-10-CM

## 2018-08-30 MED ORDER — ROSUVASTATIN CALCIUM 5 MG PO TABS: 5.0000 mg | ORAL_TABLET | Freq: Every day | ORAL | 1 refills | Status: DC

## 2018-08-30 MED ORDER — ROSUVASTATIN CALCIUM 5 MG PO TABS: 5.0000 mg | ORAL_TABLET | Freq: Every day | ORAL | 3 refills | Status: DC

## 2018-08-30 NOTE — Patient Instructions (Signed)
Medication Instructions:  Your physician has recommended you make the following change in your medication:  REDUCE Crestor (Rosuvastatin) to 5mg  daily. An Rx has been sent to your pharmacy   Labwork: None ordered  Testing/Procedures: None ordered  Follow-Up: Your physician recommends that you schedule a follow-up appointment in: 3 months with Dr.Arida   Any Other Special Instructions Will Be Listed Below (If Applicable).     If you need a refill on your cardiac medications before your next appointment, please call your pharmacy.

## 2018-08-30 NOTE — Progress Notes (Signed)
  Cardiology Office Note   Date:  08/30/2018   ID:  Lisa Vega, DOB 11/27/1932, MRN [id number]  PCP:  Burns, Stacy J, MD  Cardiologist:   Muhammad Arida, MD   No chief complaint on file.     History of Present Illness: Lisa Vega is a 84 y.o. female who is here today for a follow-up visit regarding bilateral carotid disease and left bundle branch block.   She has known history of COPD, lung cancer status post right lower lobe lobectomy in 2005, rheumatoid arthritis and previous tobacco use. CT scan of the neck in November, 2018 showed advanced atherosclerosis affecting both carotid bifurcations. Carotid Doppler in February 2019 showed moderate bilateral disease worse on the left side at 60-79%. Lexiscan Myoview showed no evidence of ischemia but EF was estimated to be 43%. Echocardiogram showed mildly reduced ejection fraction at 45-50% with no significant valvular abnormalities.  She is hesitant to take prescription medications.  She was started on rosuvastatin 20 mg daily given hyperlipidemia and presence of carotid disease.  She stopped taking the medication because it bothered her stomach.  She went to [place] to visit family and got sick there.  She had fevers with weakness.  She had an episode where she felt motionless.  She went to the emergency room there and was diagnosed with a urinary tract infection.  She was given antibiotics with improvement in symptoms.  She continues to be concerned about multiple complaints including pain in the left side of her neck, headaches and increased shortness of breath.  Also her EKG in the emergency room in [place] showed PVCs.  She reports mild palpitations overall.    Past Medical History:  Diagnosis Date  . BPV (benign positional vertigo) 03/10/2018  . Cancer (HCC)    lung ca  . COPD (chronic obstructive pulmonary disease) (HCC)   . Dyspnea   . GERD (gastroesophageal reflux disease)   . Hypercholesteremia   . Memory  difficulties 10/28/2015  . Rheumatoid arthritis(714.0)   . Thyroid disease   . Tremor 10/28/2015   Jaw tremor  . Uterine prolapse   . Vitamin D deficiency     Past Surgical History:  Procedure Laterality Date  . APPENDECTOMY    . HEMORRHOID SURGERY    . right lobectomy  10/2004  . TONSILLECTOMY AND ADENOIDECTOMY       Current Outpatient Medications  Medication Sig Dispense Refill  . acetaminophen (TYLENOL) 325 MG tablet Take 650 mg by mouth every 6 (six) hours as needed.    . ALPRAZolam (XANAX) 0.25 MG tablet TAKE 1 TABLET BY MOUTH DAILY AS NEEDED FOR ANXIETY 20 tablet 1  . blood glucose meter kit and supplies KIT Dispense based on patient and insurance preference. Use up to four times daily as directed. (FOR E11.9). 1 each 0  . budesonide-formoterol (SYMBICORT) 160-4.5 MCG/ACT inhaler 2 puffs BID 1 Inhaler 0  . fluconazole (DIFLUCAN) 150 MG tablet Take one tablet today and repeat in 3 days 2 tablet 1  . folic acid (FOLVITE) 1 MG tablet Take 1 mg by mouth daily.    . gabapentin (NEURONTIN) 100 MG capsule Take 2 capsules (200 mg total) by mouth 3 (three) times daily. 540 capsule 1  . glucose blood (ONE TOUCH ULTRA TEST) test strip Use to test blood sugars up to 4 times daily. 400 each 1  . leflunomide (ARAVA) 20 MG tablet leflunomide 20 mg tablet    . linaclotide (LINZESS) 72 MCG capsule Linzess 72   mcg capsule    . meclizine (ANTIVERT) 25 MG tablet Take 0.5-1 tablets (12.5-25 mg total) by mouth 3 (three) times daily as needed for dizziness. 30 tablet 5  . methotrexate 50 MG/2ML injection INJECT 0.9 ML SUBCUTANEOUS WEEKLY ON THE SAME DAY FOR 30 DAYS  1  . NONFORMULARY OR COMPOUNDED ITEM Estradiol .02% 1 ML Prefilled Applicator Sig: apply vaginally twice a week #90 Day Supply with 4 refills 1 each 0  . ONETOUCH DELICA LANCETS 62B MISC USE TO CHECK SUGARS UP TO 4 TIMES A DAY  0  . predniSONE (DELTASONE) 20 MG tablet prednisone 20 mg tablet    . PROAIR HFA 108 (90 Base) MCG/ACT  inhaler USE 1-2 PUFFS EVERY 4-6 HOURS AS NEEDED 8.5 g 1  . ranitidine (ZANTAC) 150 MG tablet TAKE 1 TABLET BY MOUTH EVERY EVENING AT BEDTIME AS NEEDED FOR HEARTBURN 90 tablet 3  . sertraline (ZOLOFT) 100 MG tablet TAKE 1 TABLET BY MOUTH EVERY DAY 90 tablet 1  . sulfamethoxazole-trimethoprim (BACTRIM DS) 800-160 MG tablet Take 1 tablet by mouth 2 (two) times daily. 6 tablet 0  . SYNTHROID 88 MCG tablet TAKE 1 TABLET DAILY 90 tablet 1  . tiotropium (SPIRIVA HANDIHALER) 18 MCG inhalation capsule Spiriva with HandiHaler 18 mcg and inhalation capsules    . Tiotropium Bromide Monohydrate (SPIRIVA RESPIMAT) 2.5 MCG/ACT AERS Inhale 2 puffs into the lungs daily. 1 Inhaler 0  . traMADol (ULTRAM) 50 MG tablet Take 50 mg by mouth 3 (three) times daily as needed.  0  . triamcinolone cream (KENALOG) 0.1 % Apply 1 application daily as needed topically. Thin layer on vulva as needed 30 g 3  . nitrofurantoin, macrocrystal-monohydrate, (MACROBID) 100 MG capsule Take 1 capsule (100 mg total) by mouth 2 (two) times daily. (Patient not taking: Reported on 08/30/2018) 10 capsule 0  . potassium chloride SA (K-DUR,KLOR-CON) 20 MEQ tablet Take 1 tablet (20 mEq total) by mouth daily. (Patient not taking: Reported on 08/30/2018) 30 tablet 2  . rosuvastatin (CRESTOR) 5 MG tablet Take 1 tablet (5 mg total) by mouth daily. 90 tablet 3   No current facility-administered medications for this visit.     Allergies:   Aspirin; Codeine; Demerol; Meperidine hcl; and Meperidine hcl    Social History:  The patient  reports that she quit smoking about 13 years ago. Her smoking use included cigarettes. She has a 20.00 pack-year smoking history. She has never used smokeless tobacco. She reports that she drinks about 1.0 standard drinks of alcohol per week. She reports that she does not use drugs.   Family History:  The patient's family history includes Asthma in her mother; Bone cancer in her father; Emphysema in her mother; Heart disease  in her brother and sister; Lung cancer in her father; Pancreatic cancer in her father; Stroke in her maternal grandfather.    ROS:  Please see the history of present illness.   Otherwise, review of systems are positive for none.   All other systems are reviewed and negative.    PHYSICAL EXAM: VS:  BP 110/60   Pulse 66   Ht 5' 3.5" (1.613 m)   Wt 130 lb (59 kg)   BMI 22.67 kg/m  , BMI Body mass index is 22.67 kg/m. GEN: Well nourished, well developed, in no acute distress  HEENT: normal  Neck: no JVD, carotid bruits, or masses Cardiac: RRR; no murmurs, rubs, or gallops,no edema  Respiratory:  clear to auscultation bilaterally, normal work of breathing GI: soft,  nontender, nondistended, + BS MS: no deformity or atrophy  Skin: warm and dry, no rash Neuro:  Strength and sensation are intact Psych: euthymic mood, full affect   EKG:  EKG is not  ordered today.   Recent Labs: 09/22/2017: TSH 1.40 07/22/2018: ALT 11; BUN 10; Creatinine, Ser 0.79; Hemoglobin 13.7; Platelets 164.0; Potassium 4.1; Sodium 139    Lipid Panel    Component Value Date/Time   CHOL 245 (H) 04/21/2018 0821   TRIG 171 (H) 04/21/2018 0821   HDL 63 04/21/2018 0821   CHOLHDL 3.9 04/21/2018 0821   LDLCALC 148 (H) 04/21/2018 0821      Wt Readings from Last 3 Encounters:  08/30/18 130 lb (59 kg)  08/19/18 130 lb (59 kg)  07/22/18 128 lb (58.1 kg)       No flowsheet data found.    ASSESSMENT AND PLAN:  1.  Moderate bilateral carotid disease worse on the left side: Continue aspirin 81 mg once daily.  I discussed with her the rationale for using a statin.  She agreed to resume rosuvastatin at a lower dose of 5 mg once daily.   2.  Cardiomyopathy: Overall mild with an EF of 45-50%.  No evidence of ischemia on nuclear stress test.  No evidence of volume overload.  She is hesitant to start any medications for this.  Given the presence of PVCs, we should consider a small dose beta-blocker.  3.  Hyperlipidemia: Resume rosuvastatin at a lower dose of 5 mg daily.  The patient has multiple complaints that are difficult to to attribute to in etiology.  There seems to be a definite anxiety component and I tried to reassure her.  Instead of ordering more testing at the present time, I asked her to monitor her symptoms for now with reevaluation in 3 months.   Disposition:   FU with me in 3 months  Signed,  Kathlyn Sacramento, MD  08/30/2018 5:43 PM    [place]

## 2018-08-31 ENCOUNTER — Other Ambulatory Visit: Payer: Self-pay | Admitting: Pulmonary Disease

## 2018-08-31 MED ORDER — BUDESONIDE-FORMOTEROL FUMARATE 160-4.5 MCG/ACT IN AERO: INHALATION_SPRAY | RESPIRATORY_TRACT | 1 refills | Status: DC

## 2018-09-01 ENCOUNTER — Other Ambulatory Visit: Payer: Self-pay

## 2018-09-01 MED ORDER — ROSUVASTATIN CALCIUM 5 MG PO TABS: 5.0000 mg | ORAL_TABLET | Freq: Every day | ORAL | 3 refills | Status: DC

## 2018-09-02 ENCOUNTER — Other Ambulatory Visit: Payer: Self-pay

## 2018-09-02 MED ORDER — BUDESONIDE-FORMOTEROL FUMARATE 160-4.5 MCG/ACT IN AERO: INHALATION_SPRAY | RESPIRATORY_TRACT | 3 refills | Status: DC

## 2018-09-07 ENCOUNTER — Ambulatory Visit
Admission: RE | Admit: 2018-09-07 | Discharge: 2018-09-07 | Disposition: A | Discharge status: Home / Self Care | Payer: Medicare Other | Source: Ambulatory Visit | Attending: Neurology | Admitting: Neurology

## 2018-09-07 ENCOUNTER — Other Ambulatory Visit: Payer: Medicare Other

## 2018-09-07 ENCOUNTER — Encounter

## 2018-09-07 ENCOUNTER — Ambulatory Visit: Payer: Medicare Other | Admitting: Neurology

## 2018-09-07 DIAGNOSIS — H814 Vertigo of central origin: Secondary | ICD-10-CM

## 2018-09-07 DIAGNOSIS — I6523 Occlusion and stenosis of bilateral carotid arteries: Secondary | ICD-10-CM | POA: Diagnosis not present

## 2018-09-07 DIAGNOSIS — G4489 Other headache syndrome: Secondary | ICD-10-CM

## 2018-09-07 DIAGNOSIS — R55 Syncope and collapse: Secondary | ICD-10-CM | POA: Diagnosis not present

## 2018-09-07 MED ORDER — IOPAMIDOL (ISOVUE-370) INJECTION 76%
75.0000 mL | Freq: Once | INTRAVENOUS | Status: AC | PRN
  Administered 2018-09-07: 75 mL via INTRAVENOUS

## 2018-09-08 ENCOUNTER — Telehealth: Payer: Self-pay | Admitting: Neurology

## 2018-09-08 ENCOUNTER — Encounter: Payer: Self-pay | Admitting: Gynecology

## 2018-09-08 ENCOUNTER — Ambulatory Visit (INDEPENDENT_AMBULATORY_CARE_PROVIDER_SITE_OTHER): Payer: Medicare Other | Admitting: Gynecology

## 2018-09-08 VITALS — BP 130/84

## 2018-09-08 DIAGNOSIS — R102 Pelvic and perineal pain: Secondary | ICD-10-CM | POA: Diagnosis not present

## 2018-09-08 DIAGNOSIS — N898 Other specified noninflammatory disorders of vagina: Secondary | ICD-10-CM

## 2018-09-08 DIAGNOSIS — N8189 Other female genital prolapse: Secondary | ICD-10-CM

## 2018-09-08 DIAGNOSIS — I6523 Occlusion and stenosis of bilateral carotid arteries: Secondary | ICD-10-CM | POA: Diagnosis not present

## 2018-09-08 DIAGNOSIS — I6522 Occlusion and stenosis of left carotid artery: Secondary | ICD-10-CM

## 2018-09-08 DIAGNOSIS — N3000 Acute cystitis without hematuria: Secondary | ICD-10-CM

## 2018-09-08 LAB — WET PREP FOR TRICH, YEAST, CLUE

## 2018-09-08 MED ORDER — NITROFURANTOIN MONOHYD MACRO 100 MG PO CAPS: 100.0000 mg | ORAL_CAPSULE | Freq: Two times a day (BID) | ORAL | 0 refills | Status: DC

## 2018-09-08 NOTE — Patient Instructions (Signed)
Take the antibiotic twice daily for 7 days.  Follow-up to have the pessary replaced in a week or 2.

## 2018-09-08 NOTE — Addendum Note (Signed)
Addended by: Thurnell Garbe A on: 09/08/2018 10:43 AM   Modules accepted: Orders

## 2018-09-08 NOTE — Telephone Encounter (Signed)
  I called the patient, the left internal carotid artery is almost occluded, very high-grade stenosis.  I have recommended a surgical referral, they are amenable to this, I will get this set up.   CTA head and neck 09/07/18:  IMPRESSION:  1. High-grade stenosis of the proximal left internal carotid artery.  The lumen is narrowed to less than 1 mm. Decreased caliber of the  more distal cervical left ICA relative to the right ICA is  consistent with a significant hemodynamic effect and high-grade  stenosis.  2. Atherosclerotic changes at the right carotid bifurcation without  significant stenosis relative to the more distal vessel.  3. Atherosclerotic changes at the aorta and cavernous internal  carotid arteries bilaterally without significant stenosis.  4. Moderate stenosis of the proximal left posterior communicating  artery with a left P1 segment also contributing to the left  posterior cerebral artery.  5. No other significant proximal stenosis, aneurysm, or branch  vessel occlusion within the circle-of-Clanton Emanuelson.  6. No acute or focal lesion to explain headaches.  7. Moderate stenosis of the proximal left vertebral artery, the non  dominant vessel.  8. Multilevel degenerative changes in the cervical spine.

## 2018-09-08 NOTE — Progress Notes (Signed)
    Lisa Vega 11-21-1932 [id number]        84 y.o.  [id number] presents complaining of several weeks of pelvic discomfort primarily on the left.  Has pessary for pelvic relaxation.  Wonders whether it is pushing on things.  Has never had issues with this before.  Also notes some vaginal odor.  No significant discharge itching or irritation.  No urinary symptoms such as frequency dysuria urgency fever or chills.  Past medical history,surgical history, problem list, medications, allergies, family history and social history were all reviewed and documented in the EPIC chart.  Directed ROS with pertinent positives and negatives documented in the history of present illness/assessment and plan.  Exam: Copywriter, advertising Vitals:   09/08/18 0936  BP: 130/84   General appearance:  Normal Abdomen soft nontender without masses guarding rebound Pelvic external BUS vagina with atrophic changes.  White discharge noted.  Ring pessary with support was removed.  Vaginal mucosa without evidence of irritation or erosion.  Cervix atrophic.  Uterus normal size midline mobile nontender.  Adnexa without masses or tenderness.  Assessment/Plan:  84 y.o. [id number] with history and exam as above.  Wet prep is negative.  Urine analysis suspicious for UTI.  This may account for her pelvic discomfort.  Will treat with Macrobid 100 mg twice daily x7 days.  Recommend leaving the pessary out for 1 to 2 weeks and then she will return to replace it assuming she tolerates having it out.  If vaginal odor discharge continues then will reassess.    Anastasio Auerbach MD, 10:06 AM 09/08/2018

## 2018-09-09 NOTE — Telephone Encounter (Signed)
I called the daughter.  The results of the CT angiogram were reviewed with her, I have made referral to vascular surgery.  The patient did have a carotid Doppler study that showed 60 to 79% stenosis of the left internal carotid artery in February 2019, the patient has significantly progress since then.

## 2018-09-09 NOTE — Telephone Encounter (Signed)
Pt daughter has called(on DPR-Barclay,Sandra) she states they have not heard from the specialist that pt is being referred to and is concerned.  Daughter is asking for a call back to discuss

## 2018-09-10 LAB — URINE CULTURE
MICRO NUMBER:: 91099738
SPECIMEN QUALITY:: ADEQUATE

## 2018-09-10 LAB — URINALYSIS, COMPLETE W/RFL CULTURE
Bilirubin Urine: NEGATIVE
Glucose, UA: NEGATIVE
Hyaline Cast: NONE SEEN /LPF
Nitrites, Initial: NEGATIVE
Specific Gravity, Urine: 1.025 (ref 1.001–1.03)
pH: 5.5 (ref 5.0–8.0)

## 2018-09-10 LAB — CULTURE INDICATED

## 2018-09-12 ENCOUNTER — Other Ambulatory Visit: Payer: Self-pay | Admitting: Pulmonary Disease

## 2018-09-12 NOTE — Telephone Encounter (Signed)
Pt has called to inform Dr Jannifer Franklin that she is unable to get an appointment in with Dr Carlis Abbott until Oct 8th.  Pt states this is very upsetting to her and she'd like  Call back to ensure due to delay in this appointment this will not cause her a stroke or anything.  Please call

## 2018-09-12 NOTE — Telephone Encounter (Signed)
I called the patient.  The patient has an appoint with Dr. Carlis Abbott on 8 October, I have called their office to see if she can get worked in a little bit sooner if possible.  The patient does have severe carotid stenosis by CT angiogram.

## 2018-09-13 NOTE — Patient Instructions (Signed)
  Test(s) ordered today. Your results will be released to MyChart (or called to you) after review, usually within 72hours after test completion. If any changes need to be made, you will be notified at that same time.  All other Health Maintenance issues reviewed.   All recommended immunizations and age-appropriate screenings are up-to-date or discussed.  No immunizations administered today.   Medications reviewed and updated.  Changes include  /  No changes recommended at this time.  Your prescription(s) have been submitted to your pharmacy. Please take as directed and contact our office if you believe you are having problem(s) with the medication(s).  A referral was ordered for   Please followup in 6 months   

## 2018-09-13 NOTE — Progress Notes (Signed)
Subjective:    Patient ID: Lisa Vega, female    DOB: [birth year], 84 y.o.   MRN: [id number]  HPI The patient is here for follow up.      Medications and allergies reviewed with patient and updated if appropriate.  Patient Active Problem List   Diagnosis Date Noted  . BPV (benign positional vertigo) 03/10/2018  . History of lung cancer 11/29/2017  . Difficulty sleeping 09/22/2017  . Diabetes ([place]) 05/13/2017  . Fatigue 05/12/2017  . CAP (community acquired pneumonia) 05/03/2017  . Orthopnea 01/12/2017  . GERD (gastroesophageal reflux disease) 11/09/2016  . Osteoporosis 05/06/2016  . Depression with anxiety 05/06/2016  . Hiatal hernia 05/06/2016  . Hypothyroidism 05/06/2016  . Tremor 10/28/2015  . Memory difficulties 10/28/2015  . Hoarseness 10/14/2015  . History of vitamin D deficiency 05/02/2015  . Other constipation 01/29/2015  . Abdominal pain, chronic, epigastric 11/01/2014  . 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 ([place]) 05/30/2014  . Diverticulitis of colon without hemorrhage 08/07/2013  . Prolapse of female pelvic organs 06/22/2013  . Neck pain 03/10/2013  . Vaginal atrophy 03/09/2013  . Rectocele 03/09/2013  . Cystocele 03/09/2013  . Female pelvic pain 03/09/2013  . Urinary incontinence 03/09/2013  . COPD (chronic obstructive pulmonary disease) ([place]) 01/16/2011  . CARCINOMA, LUNG, NONSMALL CELL 12/17/2010  . Rheumatoid arthritis ([place]) 12/17/2010  . Dyspnea on exertion 12/17/2010    Current Outpatient Medications on File Prior to Visit  Medication Sig Dispense Refill  . acetaminophen (TYLENOL) 325 MG tablet Take 650 mg by mouth every 6 (six) hours as needed.    . ALPRAZolam (XANAX) 0.25 MG tablet TAKE 1 TABLET BY MOUTH DAILY AS NEEDED FOR ANXIETY 20 tablet 1  . blood glucose meter kit and supplies KIT Dispense based on patient and insurance preference. Use up to four times daily as directed. (FOR E11.9). 1 each 0  . budesonide-formoterol  (SYMBICORT) 160-4.5 MCG/ACT inhaler 2 puffs BID 3 Inhaler 3  . fluconazole (DIFLUCAN) 150 MG tablet Take one tablet today and repeat in 3 days 2 tablet 1  . folic acid (FOLVITE) 1 MG tablet Take 1 mg by mouth daily.    Marland Kitchen gabapentin (NEURONTIN) 100 MG capsule Take 2 capsules (200 mg total) by mouth 3 (three) times daily. 540 capsule 1  . glucose blood (ONE TOUCH ULTRA TEST) test strip Use to test blood sugars up to 4 times daily. 400 each 1  . leflunomide (ARAVA) 20 MG tablet leflunomide 20 mg tablet    . linaclotide (LINZESS) 72 MCG capsule Linzess 72 mcg capsule    . meclizine (ANTIVERT) 25 MG tablet Take 0.5-1 tablets (12.5-25 mg total) by mouth 3 (three) times daily as needed for dizziness. 30 tablet 5  . methotrexate 50 MG/2ML injection INJECT 0.9 ML SUBCUTANEOUS WEEKLY ON THE SAME DAY FOR 30 DAYS  1  . nitrofurantoin, macrocrystal-monohydrate, (MACROBID) 100 MG capsule Take 1 capsule (100 mg total) by mouth 2 (two) times daily. For 7 days 14 capsule 0  . NONFORMULARY OR COMPOUNDED ITEM Estradiol .02% 1 ML Prefilled Applicator Sig: apply vaginally twice a week #90 Day Supply with 4 refills 1 each 0  . ONETOUCH DELICA LANCETS 61E MISC USE TO CHECK SUGARS UP TO 4 TIMES A DAY  0  . predniSONE (DELTASONE) 20 MG tablet prednisone 20 mg tablet    . PROAIR HFA 108 (90 Base) MCG/ACT inhaler USE 1-2 PUFFS EVERY 4-6 HOURS AS NEEDED 8.5 g 1  . ranitidine (ZANTAC) 150 MG  tablet TAKE 1 TABLET BY MOUTH EVERY EVENING AT BEDTIME AS NEEDED FOR HEARTBURN 90 tablet 3  . rosuvastatin (CRESTOR) 5 MG tablet Take 1 tablet (5 mg total) by mouth daily. 90 tablet 3  . sertraline (ZOLOFT) 100 MG tablet TAKE 1 TABLET BY MOUTH EVERY DAY 90 tablet 1  . sulfamethoxazole-trimethoprim (BACTRIM DS) 800-160 MG tablet Take 1 tablet by mouth 2 (two) times daily. 6 tablet 0  . SYNTHROID 88 MCG tablet TAKE 1 TABLET DAILY 90 tablet 1  . tiotropium (SPIRIVA HANDIHALER) 18 MCG inhalation capsule Spiriva with HandiHaler 18 mcg and  inhalation capsules    . Tiotropium Bromide Monohydrate (SPIRIVA RESPIMAT) 2.5 MCG/ACT AERS Inhale 2 puffs into the lungs daily. 1 Inhaler 0  . traMADol (ULTRAM) 50 MG tablet Take 50 mg by mouth 3 (three) times daily as needed.  0  . triamcinolone cream (KENALOG) 0.1 % Apply 1 application daily as needed topically. Thin layer on vulva as needed 30 g 3   No current facility-administered medications on file prior to visit.     Past Medical History:  Diagnosis Date  . BPV (benign positional vertigo) 03/10/2018  . Cancer ([place])    lung ca  . COPD (chronic obstructive pulmonary disease) ([place])   . Dyspnea   . GERD (gastroesophageal reflux disease)   . Hypercholesteremia   . Memory difficulties 10/28/2015  . Rheumatoid arthritis(714.0)   . Thyroid disease   . Tremor 10/28/2015   Jaw tremor  . Uterine prolapse   . Vitamin D deficiency     Past Surgical History:  Procedure Laterality Date  . APPENDECTOMY    . HEMORRHOID SURGERY    . right lobectomy  10/2004  . TONSILLECTOMY AND ADENOIDECTOMY      Social History   Socioeconomic History  . Marital status: Widowed    Spouse name: Not on file  . Number of children: 3  . Years of education: LPN  . Highest education level: Not on file  Occupational History  . Occupation: retired    Fish farm manager: RETIRED    Comment: LPN at [employer]  . Financial resource strain: Not hard at all  . Food insecurity:    Worry: Never true    Inability: Never true  . Transportation needs:    Medical: No    Non-medical: No  Tobacco Use  . Smoking status: Former Smoker    Packs/day: 1.00    Years: 20.00    Pack years: 20.00    Types: Cigarettes    Last attempt to quit: 11/10/2004    Years since quitting: 13.8  . Smokeless tobacco: Never Used  Substance and Sexual Activity  . Alcohol use: Yes    Alcohol/week: 1.0 standard drinks    Types: 1 Glasses of wine per week    Comment: socially  . Drug use: No  . Sexual  activity: Not Currently    Comment: 1st intercourse- 19, partners- 5,   Lifestyle  . Physical activity:    Days per week: 3 days    Minutes per session: 40 min  . Stress: To some extent  Relationships  . Social connections:    Talks on phone: More than three times a week    Gets together: More than three times a week    Attends religious service: More than 4 times per year    Active member of club or organization: Yes    Attends meetings of clubs or organizations: More than 4 times  per year    Relationship status: Widowed  Other Topics Concern  . Not on file  Social History Narrative   Patient drinks about 3 cups of coffee daily.   Patient is right handed.     Family History  Problem Relation Age of Onset  . Emphysema Mother   . Asthma Mother   . Lung cancer Father   . Pancreatic cancer Father   . Bone cancer Father   . Heart disease Brother        x1  . Heart disease Sister        x2  . Stroke Maternal Grandfather     Review of Systems     Objective:  There were no vitals filed for this visit. BP Readings from Last 3 Encounters:  09/08/18 130/84  08/30/18 110/60  08/19/18 128/70   Wt Readings from Last 3 Encounters:  08/30/18 130 lb (59 kg)  08/19/18 130 lb (59 kg)  07/22/18 128 lb (58.1 kg)   There is no height or weight on file to calculate BMI.   Physical Exam         Assessment & Plan:    See Problem List for Assessment and Plan of chronic medical problems.   This encounter was created in error - please disregard.

## 2018-09-14 ENCOUNTER — Encounter: Payer: Medicare Other | Admitting: Internal Medicine

## 2018-09-16 ENCOUNTER — Telehealth: Payer: Self-pay | Admitting: Pulmonary Disease

## 2018-09-16 MED ORDER — BUDESONIDE-FORMOTEROL FUMARATE 160-4.5 MCG/ACT IN AERO: INHALATION_SPRAY | RESPIRATORY_TRACT | 3 refills | Status: DC

## 2018-09-16 NOTE — Telephone Encounter (Signed)
Received call from [facility], who requested Rx for symbicort 160, as current Rx is expired.  Rx has been sent to preferred pharmacy. Nothing further is needed.

## 2018-09-19 ENCOUNTER — Encounter: Payer: Self-pay | Admitting: Neurology

## 2018-09-19 ENCOUNTER — Ambulatory Visit (INDEPENDENT_AMBULATORY_CARE_PROVIDER_SITE_OTHER): Payer: Medicare Other | Admitting: Neurology

## 2018-09-19 DIAGNOSIS — I6522 Occlusion and stenosis of left carotid artery: Secondary | ICD-10-CM | POA: Insufficient documentation

## 2018-09-19 DIAGNOSIS — I6523 Occlusion and stenosis of bilateral carotid arteries: Secondary | ICD-10-CM

## 2018-09-19 HISTORY — DX: Occlusion and stenosis of left carotid artery: I65.22

## 2018-09-19 MED ORDER — DULOXETINE HCL 30 MG PO CPEP: 30.0000 mg | ORAL_CAPSULE | Freq: Every day | ORAL | 3 refills | Status: DC

## 2018-09-19 MED ORDER — MECLIZINE HCL 25 MG PO TABS: 12.5000 mg | ORAL_TABLET | Freq: Three times a day (TID) | ORAL | 5 refills | Status: DC | PRN

## 2018-09-19 NOTE — Patient Instructions (Signed)
We will start low dose Cymbalta for the headache.  Cymbalta (duloxetine) is an antidepressant medication that is commonly used for peripheral neuropathy pain or for fibromyalgia pain. As with any antidepressant medication, worsening depression can be seen. This medication can potentially cause headache, dizziness, sexual dysfunction, or nausea. If any problems are noted on this medication, please contact our office.

## 2018-09-19 NOTE — Progress Notes (Signed)
Reason for visit: Headache  Lisa Vega is an 84 y.o. female  History of present illness:  Lisa Vega is an 84 year old right-handed white female with a history of rheumatoid arthritis and headache.  The patient has had some increase in her headache recently, she is having headaches almost every day.  The headaches are more on the left side of the head in the frontotemporal area, some burning sensation behind the eye may occur, she may have some left occipital headache.  She also has some occasional left shoulder pain.  The patient has had some episodes of visual blurring affecting the left eye primarily, without visual loss.  At nighttime, she may have pulsatile tinnitus.  CT angiogram of the head and neck done recently shows a severe stenosis of the left internal carotid artery with a 1 mm lumen.  She has been referred to vascular surgery, but her consultation is not until 04 October 2018.  The patient does have a history of vertigo off and on, meclizine will help this.  The patient is on gabapentin taking 200 mg 3 times a day, she cannot take a higher dose of this.  Past Medical History:  Diagnosis Date  . BPV (benign positional vertigo) 03/10/2018  . Cancer ([place])    lung ca  . Carotid stenosis, left 09/19/2018  . COPD (chronic obstructive pulmonary disease) ([place])   . Dyspnea   . GERD (gastroesophageal reflux disease)   . Hypercholesteremia   . Memory difficulties 10/28/2015  . Rheumatoid arthritis(714.0)   . Thyroid disease   . Tremor 10/28/2015   Jaw tremor  . Uterine prolapse   . Vitamin D deficiency     Past Surgical History:  Procedure Laterality Date  . APPENDECTOMY    . HEMORRHOID SURGERY    . right lobectomy  10/2004  . TONSILLECTOMY AND ADENOIDECTOMY      Family History  Problem Relation Age of Onset  . Emphysema Mother   . Asthma Mother   . Lung cancer Father   . Pancreatic cancer Father   . Bone cancer Father   . Heart disease Brother        x1  .  Heart disease Sister        x2  . Stroke Maternal Grandfather     Social history:  reports that she quit smoking about 13 years ago. Her smoking use included cigarettes. She has a 20.00 pack-year smoking history. She has never used smokeless tobacco. She reports that she drinks about 1.0 standard drinks of alcohol per week. She reports that she does not use drugs.    Allergies  Allergen Reactions  . Aspirin   . Codeine     REACTION: hallucinations  . Demerol   . Meperidine Hcl     REACTION: severe GI upset  . Meperidine Hcl     Medications:  Prior to Admission medications   Medication Sig Start Date End Date Taking? Authorizing Provider  acetaminophen (TYLENOL) 325 MG tablet Take 650 mg by mouth every 6 (six) hours as needed.   Yes [provider]  ALPRAZolam (XANAX) 0.25 MG tablet TAKE 1 TABLET BY MOUTH DAILY AS NEEDED FOR ANXIETY 04/01/18  Yes Burns, [name], MD  blood glucose meter kit and supplies KIT Dispense based on patient and insurance preference. Use up to four times daily as directed. (FOR E11.9). 03/14/18  Yes Binnie Rail, MD  budesonide-formoterol [facility]) 160-4.5 MCG/ACT inhaler 2 puffs BID 09/16/18  Yes Simonne Maffucci  B, MD  folic acid (FOLVITE) 1 MG tablet Take 1 mg by mouth daily.   Yes [provider]  gabapentin (NEURONTIN) 100 MG capsule Take 2 capsules (200 mg total) by mouth 3 (three) times daily. 03/10/18  Yes Kathrynn Ducking, MD  glucose blood (ONE TOUCH ULTRA TEST) test strip Use to test blood sugars up to 4 times daily. 05/16/18  Yes Burns, [name], MD  leflunomide (ARAVA) 20 MG tablet leflunomide 20 mg tablet   Yes [provider]  linaclotide (LINZESS) 72 MCG capsule Linzess 72 mcg capsule   Yes [provider]  meclizine (ANTIVERT) 25 MG tablet Take 0.5-1 tablets (12.5-25 mg total) by mouth 3 (three) times daily as needed for dizziness. 03/14/18  Yes Burns, [name], MD  methotrexate 50 MG/2ML injection INJECT 0.9 ML  SUBCUTANEOUS WEEKLY ON THE SAME DAY FOR 30 DAYS 07/21/18  Yes [provider]  nitrofurantoin, macrocrystal-monohydrate, (MACROBID) 100 MG capsule Take 1 capsule (100 mg total) by mouth 2 (two) times daily. For 7 days 09/08/18  Yes Fontaine, Belinda Block, MD  NONFORMULARY OR COMPOUNDED ITEM Estradiol .02% 1 ML Prefilled Applicator Sig: apply vaginally twice a week #90 Day Supply with 4 refills 06/17/17  Yes Terrance Mass, MD  [facility] DELICA LANCETS 50P MISC USE TO CHECK SUGARS UP TO 4 TIMES A DAY 03/19/18  Yes [provider]  predniSONE (DELTASONE) 20 MG tablet prednisone 20 mg tablet   Yes [provider]  PROAIR HFA 108 (90 Base) MCG/ACT inhaler USE 1-2 PUFFS EVERY 4-6 HOURS AS NEEDED 10/05/16  Yes Burns, [name], MD  ranitidine (ZANTAC) 150 MG tablet TAKE 1 TABLET BY MOUTH EVERY EVENING AT BEDTIME AS NEEDED FOR HEARTBURN 03/14/18  Yes Burns, [name], MD  rosuvastatin (CRESTOR) 5 MG tablet Take 1 tablet (5 mg total) by mouth daily. 09/01/18 11/30/18 Yes Wellington Hampshire, MD  sertraline (ZOLOFT) 100 MG tablet TAKE 1 TABLET BY MOUTH EVERY DAY 07/25/18  Yes Burns, [name], MD  SYNTHROID 88 MCG tablet TAKE 1 TABLET DAILY 12/01/17  Yes Binnie Rail, MD  tiotropium (SPIRIVA HANDIHALER) 18 MCG inhalation capsule Spiriva with HandiHaler 18 mcg and inhalation capsules   Yes [provider]  Tiotropium Bromide Monohydrate (SPIRIVA RESPIMAT) 2.5 MCG/ACT AERS Inhale 2 puffs into the lungs daily. 08/19/18  Yes Juanito Doom, MD  traMADol (ULTRAM) 50 MG tablet Take 50 mg by mouth 3 (three) times daily as needed. 05/07/18  Yes [provider]  triamcinolone cream (KENALOG) 0.1 % Apply 1 application daily as needed topically. Thin layer on vulva as needed 11/12/17  Yes Princess Bruins, MD  sulfamethoxazole-trimethoprim (BACTRIM DS) 800-160 MG tablet Take 1 tablet by mouth 2 (two) times daily. Patient not taking: Reported on 09/19/2018 07/14/18   Huel Cote, NP     ROS:  Out of a complete 14 system review of symptoms, the patient complains only of the following symptoms, and all other reviewed systems are negative.  Fatigue Difficulty swallowing Eye discharge, eye redness, light sensitivity, eye pain, blurred vision Shortness of breath, chest tightness Palpitations of the heart Constipation Frequent waking Joint pain, joint swelling, neck pain, neck stiffness Bruising easily Dizziness, headache, weakness, tremors Confusion, anxiety  Blood pressure (!) 141/60, pulse 63, weight 130 lb 8 oz (59.2 kg).  Physical Exam  General: The patient is alert and cooperative at the time of the examination.  Skin: No significant peripheral edema is noted.   Neurologic Exam  Mental  status: The patient is alert and oriented x 3 at the time of the examination. The patient has apparent normal recent and remote memory, with an apparently normal attention span and concentration ability.   Cranial nerves: Facial symmetry is present. Speech is normal, no aphasia or dysarthria is noted. Extraocular movements are full. Visual fields are full.  Motor: The patient has good strength in all 4 extremities.  Sensory examination: Soft touch sensation is symmetric on the face, arms, and legs.  Coordination: The patient has good finger-nose-finger and heel-to-shin bilaterally.  Gait and station: The patient has a normal gait. Romberg is negative. No drift is seen.  Reflexes: Deep tendon reflexes are symmetric.    CTA head and neck 09/07/18:  IMPRESSION: 1. High-grade stenosis of the proximal left internal carotid artery. The lumen is narrowed to less than 1 mm. Decreased caliber of the more distal cervical left ICA relative to the right ICA is consistent with a significant hemodynamic effect and high-grade stenosis. 2. Atherosclerotic changes at the right carotid bifurcation without significant stenosis relative to the more distal vessel. 3.  Atherosclerotic changes at the aorta and cavernous internal carotid arteries bilaterally without significant stenosis. 4. Moderate stenosis of the proximal left posterior communicating artery with a left P1 segment also contributing to the left posterior cerebral artery. 5. No other significant proximal stenosis, aneurysm, or branch vessel occlusion within the circle-of-Willis. 6. No acute or focal lesion to explain headaches. 7. Moderate stenosis of the proximal left vertebral artery, the non dominant vessel. 8. Multilevel degenerative changes in the cervical spine.  * CTA scan images were reviewed online. I agree with the written report.    MRI brain 03/25/18:  IMPRESSION: Unremarkable MRI scan of the brain showing age-appropriate changes of chronic microvascular ischemia and generalized cerebral atrophy. No acute abnormality seen. No significant change compared with previous MRI dated 01/20/2016    Assessment/Plan:  1.  Chronic daily headache  2.  Left carotid stenosis, high-grade  The patient has been referred to vascular surgery for the left carotid disease.  The patient will be given a prescription for meclizine, she will be placed on Cymbalta for her headache at 30 mg daily, she will call for any dose adjustments.  The patient will follow-up in about 4 months.  Jill Alexanders MD 09/19/2018 4:03 PM  [facility] [address] [place], [place] [address]  Phone [phone] Fax [phone]

## 2018-09-22 IMAGING — DX DG CHEST 2V
2 series · 2 of 2 positions shown · non-contrast
Comparison: Prior chest x-ray 05/14/2017

CLINICAL DATA: 84-year-old female with cough, congestion, shortness of breath and
fatigue

EXAM:
CHEST  2 VIEW

[chest pa]
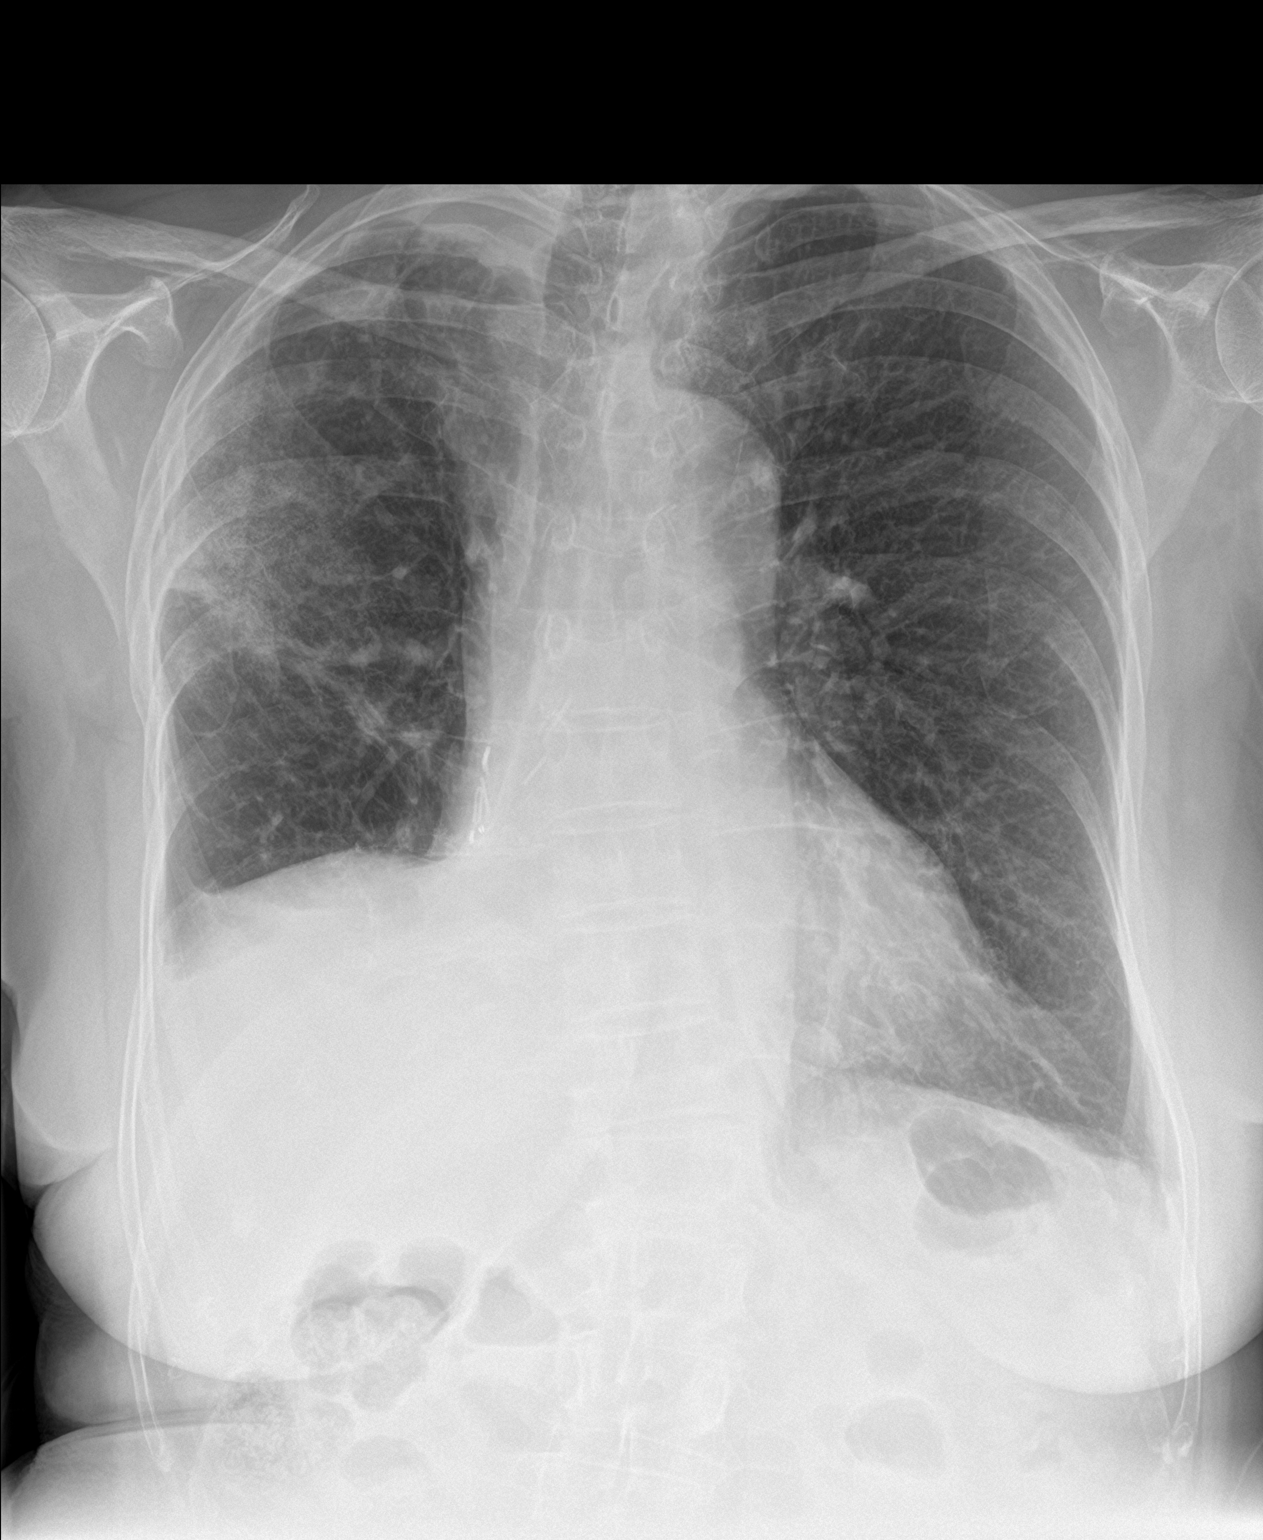

[chest lat]
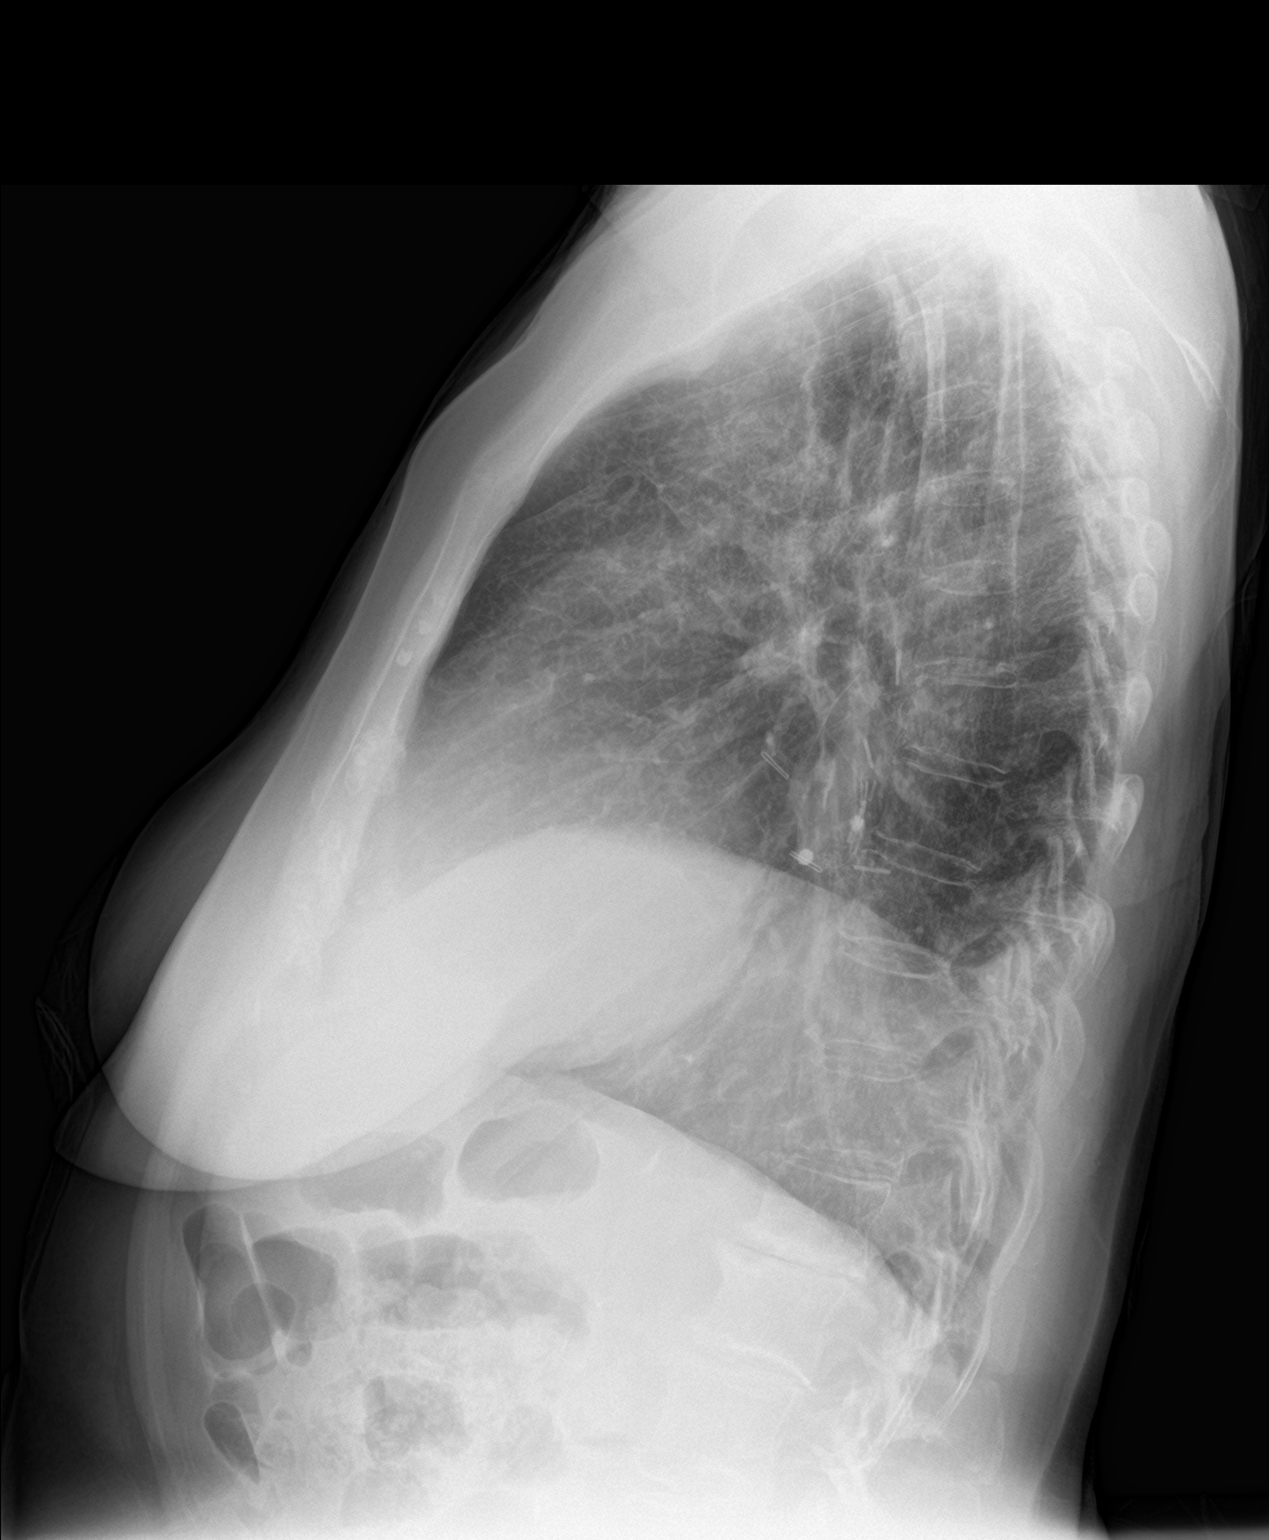

[2 of 2 positions shown; findings below may reference images not displayed]

FINDINGS: Patchy airspace opacity in the right upper lobe concerning for
bronchopneumonia. Chronic elevation of the right hemidiaphragm.
Suspect small right pleural effusion. Cardiac and mediastinal
contours remain within normal limits. No acute osseous abnormality.
IMPRESSION: 1. Patchy right upper lobe airspace opacity consistent with
bronchopneumonia. Followup PA and lateral chest X-ray is recommended
in 3-4 weeks following trial of antibiotic therapy to ensure
resolution and exclude underlying malignancy.
2. Small right-sided pleural effusion.
3. Chronic elevation of the right hemidiaphragm.

## 2018-09-25 NOTE — Progress Notes (Signed)
Subjective:    Patient ID: Lisa Vega, female    DOB: 1932/09/24, 84 y.o.   MRN: [id number]  HPI The patient is here for follow up.    Medications and allergies reviewed with patient and updated if appropriate.  Patient Active Problem List   Diagnosis Date Noted  . Carotid stenosis, left 09/19/2018  . BPV (benign positional vertigo) 03/10/2018  . History of lung cancer 11/29/2017  . Difficulty sleeping 09/22/2017  . Diabetes ([place]) 05/13/2017  . Fatigue 05/12/2017  . CAP (community acquired pneumonia) 05/03/2017  . Orthopnea 01/12/2017  . GERD (gastroesophageal reflux disease) 11/09/2016  . Osteoporosis 05/06/2016  . Depression with anxiety 05/06/2016  . Hiatal hernia 05/06/2016  . Hypothyroidism 05/06/2016  . Tremor 10/28/2015  . Memory difficulties 10/28/2015  . Hoarseness 10/14/2015  . History of vitamin D deficiency 05/02/2015  . Other constipation 01/29/2015  . Abdominal pain, chronic, epigastric 11/01/2014  . 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 ([place]) 05/30/2014  . Diverticulitis of colon without hemorrhage 08/07/2013  . Prolapse of female pelvic organs 06/22/2013  . Neck pain 03/10/2013  . Vaginal atrophy 03/09/2013  . Rectocele 03/09/2013  . Cystocele 03/09/2013  . Female pelvic pain 03/09/2013  . Urinary incontinence 03/09/2013  . COPD (chronic obstructive pulmonary disease) ([place]) 01/16/2011  . CARCINOMA, LUNG, NONSMALL CELL 12/17/2010  . Rheumatoid arthritis ([place]) 12/17/2010  . Dyspnea on exertion 12/17/2010    Current Outpatient Medications on File Prior to Visit  Medication Sig Dispense Refill  . acetaminophen (TYLENOL) 325 MG tablet Take 650 mg by mouth every 6 (six) hours as needed.    . ALPRAZolam (XANAX) 0.25 MG tablet TAKE 1 TABLET BY MOUTH DAILY AS NEEDED FOR ANXIETY 20 tablet 1  . blood glucose meter kit and supplies KIT Dispense based on patient and insurance preference. Use up to four times daily as directed. (FOR E11.9). 1  each 0  . budesonide-formoterol (SYMBICORT) 160-4.5 MCG/ACT inhaler 2 puffs BID 3 Inhaler 3  . DULoxetine (CYMBALTA) 30 MG capsule Take 1 capsule (30 mg total) by mouth daily. 30 capsule 3  . folic acid (FOLVITE) 1 MG tablet Take 1 mg by mouth daily.    Marland Kitchen gabapentin (NEURONTIN) 100 MG capsule Take 2 capsules (200 mg total) by mouth 3 (three) times daily. 540 capsule 1  . glucose blood (ONE TOUCH ULTRA TEST) test strip Use to test blood sugars up to 4 times daily. 400 each 1  . leflunomide (ARAVA) 20 MG tablet leflunomide 20 mg tablet    . linaclotide (LINZESS) 72 MCG capsule Linzess 72 mcg capsule    . meclizine (ANTIVERT) 25 MG tablet Take 0.5-1 tablets (12.5-25 mg total) by mouth 3 (three) times daily as needed for dizziness. 30 tablet 5  . methotrexate 50 MG/2ML injection INJECT 0.9 ML SUBCUTANEOUS WEEKLY ON THE SAME DAY FOR 30 DAYS  1  . nitrofurantoin, macrocrystal-monohydrate, (MACROBID) 100 MG capsule Take 1 capsule (100 mg total) by mouth 2 (two) times daily. For 7 days 14 capsule 0  . NONFORMULARY OR COMPOUNDED ITEM Estradiol .02% 1 ML Prefilled Applicator Sig: apply vaginally twice a week #90 Day Supply with 4 refills 1 each 0  . ONETOUCH DELICA LANCETS 40R MISC USE TO CHECK SUGARS UP TO 4 TIMES A DAY  0  . predniSONE (DELTASONE) 20 MG tablet prednisone 20 mg tablet    . PROAIR HFA 108 (90 Base) MCG/ACT inhaler USE 1-2 PUFFS EVERY 4-6 HOURS AS NEEDED 8.5 g 1  .  ranitidine (ZANTAC) 150 MG tablet TAKE 1 TABLET BY MOUTH EVERY EVENING AT BEDTIME AS NEEDED FOR HEARTBURN 90 tablet 3  . rosuvastatin (CRESTOR) 5 MG tablet Take 1 tablet (5 mg total) by mouth daily. 90 tablet 3  . sertraline (ZOLOFT) 100 MG tablet TAKE 1 TABLET BY MOUTH EVERY DAY 90 tablet 1  . SYNTHROID 88 MCG tablet TAKE 1 TABLET DAILY 90 tablet 1  . tiotropium (SPIRIVA HANDIHALER) 18 MCG inhalation capsule Spiriva with HandiHaler 18 mcg and inhalation capsules    . Tiotropium Bromide Monohydrate (SPIRIVA RESPIMAT) 2.5  MCG/ACT AERS Inhale 2 puffs into the lungs daily. 1 Inhaler 0  . traMADol (ULTRAM) 50 MG tablet Take 50 mg by mouth 3 (three) times daily as needed.  0  . triamcinolone cream (KENALOG) 0.1 % Apply 1 application daily as needed topically. Thin layer on vulva as needed 30 g 3   No current facility-administered medications on file prior to visit.     Past Medical History:  Diagnosis Date  . BPV (benign positional vertigo) 03/10/2018  . Cancer ([place])    lung ca  . Carotid stenosis, left 09/19/2018  . COPD (chronic obstructive pulmonary disease) ([place])   . Dyspnea   . GERD (gastroesophageal reflux disease)   . Hypercholesteremia   . Memory difficulties 10/28/2015  . Rheumatoid arthritis(714.0)   . Thyroid disease   . Tremor 10/28/2015   Jaw tremor  . Uterine prolapse   . Vitamin D deficiency     Past Surgical History:  Procedure Laterality Date  . APPENDECTOMY    . HEMORRHOID SURGERY    . right lobectomy  10/2004  . TONSILLECTOMY AND ADENOIDECTOMY      Social History   Socioeconomic History  . Marital status: Widowed    Spouse name: Not on file  . Number of children: 3  . Years of education: LPN  . Highest education level: Not on file  Occupational History  . Occupation: retired    Fish farm manager: RETIRED    Comment: LPN at [employer]  . Financial resource strain: Not hard at all  . Food insecurity:    Worry: Never true    Inability: Never true  . Transportation needs:    Medical: No    Non-medical: No  Tobacco Use  . Smoking status: Former Smoker    Packs/day: 1.00    Years: 20.00    Pack years: 20.00    Types: Cigarettes    Last attempt to quit: 11/10/2004    Years since quitting: 13.8  . Smokeless tobacco: Never Used  Substance and Sexual Activity  . Alcohol use: Yes    Alcohol/week: 1.0 standard drinks    Types: 1 Glasses of wine per week    Comment: socially  . Drug use: No  . Sexual activity: Not Currently    Comment: 1st  intercourse- 19, partners- 5,   Lifestyle  . Physical activity:    Days per week: 3 days    Minutes per session: 40 min  . Stress: To some extent  Relationships  . Social connections:    Talks on phone: More than three times a week    Gets together: More than three times a week    Attends religious service: More than 4 times per year    Active member of club or organization: Yes    Attends meetings of clubs or organizations: More than 4 times per year    Relationship status: Widowed  Other  Topics Concern  . Not on file  Social History Narrative   Patient drinks about 3 cups of coffee daily.   Patient is right handed.     Family History  Problem Relation Age of Onset  . Emphysema Mother   . Asthma Mother   . Lung cancer Father   . Pancreatic cancer Father   . Bone cancer Father   . Heart disease Brother        x1  . Heart disease Sister        x2  . Stroke Maternal Grandfather     Review of Systems     Objective:  There were no vitals filed for this visit. BP Readings from Last 3 Encounters:  09/19/18 (!) 141/60  09/08/18 130/84  08/30/18 110/60   Wt Readings from Last 3 Encounters:  09/19/18 130 lb 8 oz (59.2 kg)  08/30/18 130 lb (59 kg)  08/19/18 130 lb (59 kg)   There is no height or weight on file to calculate BMI.   Physical Exam          Assessment & Plan:    See Problem List for Assessment and Plan of chronic medical problems.   This encounter was created in error - please disregard.

## 2018-09-27 ENCOUNTER — Encounter: Payer: Medicare Other | Admitting: Internal Medicine

## 2018-09-27 DIAGNOSIS — Z0289 Encounter for other administrative examinations: Secondary | ICD-10-CM

## 2018-10-02 NOTE — Patient Instructions (Addendum)
  Tests ordered today. Your results will be released to [facility] (or called to you) after review, usually within 72hours after test completion. If any changes need to be made, you will be notified at that same time.  Flu immunization administered today.   Medications reviewed and updated.  Changes include :   none  Your prescription(s) have been submitted to your pharmacy. Please take as directed and contact our office if you believe you are having problem(s) with the medication(s).   Please followup in 6 months

## 2018-10-02 NOTE — Progress Notes (Signed)
Subjective:    Patient ID: Lisa Vega, female    DOB: 13-Jun-1932, 84 y.o.   MRN: [id number]  HPI The patient is here for follow up.  Diabetes: She is taking her medication daily as prescribed. She is compliant with a diabetic diet. She is not exercising regularly.  She checks her feet daily and denies foot lesions. She is up-to-date with an ophthalmology examination.   Hypothyroidism:  She is taking her medication daily.  She denies any recent changes in energy or weight that are unexplained.   Depression, anxiety: She is taking her medication daily as prescribed. She rarely takes xanax and typically takes 1/2 of a pill.  She denies any side effects from the medication. She feels her depression and anxiety are well controlled and she is happy with her current dose of medication.   GERD:  She is taking her medication daily as prescribed.  She denies any GERD symptoms and feels her GERD is well controlled.   RA, OA: she follows with ortho and rheum.    Carotid artery disease:  She has high grade Left ICA and will see vascular surgery tomorrow.    Medications and allergies reviewed with patient and updated if appropriate.  Patient Active Problem List   Diagnosis Date Noted  . Carotid stenosis, left 09/19/2018  . BPV (benign positional vertigo) 03/10/2018  . History of lung cancer 11/29/2017  . Difficulty sleeping 09/22/2017  . Diabetes ([place]) 05/13/2017  . Fatigue 05/12/2017  . CAP (community acquired pneumonia) 05/03/2017  . Orthopnea 01/12/2017  . GERD (gastroesophageal reflux disease) 11/09/2016  . Osteoporosis 05/06/2016  . Depression with anxiety 05/06/2016  . Hiatal hernia 05/06/2016  . Hypothyroidism 05/06/2016  . Tremor 10/28/2015  . Memory difficulties 10/28/2015  . Hoarseness 10/14/2015  . History of vitamin D deficiency 05/02/2015  . Other constipation 01/29/2015  . Abdominal pain, chronic, epigastric 11/01/2014  . Hyperparathyroidism due to vitamin D  deficiency ([place]) 05/30/2014  . Diverticulitis of colon without hemorrhage 08/07/2013  . Prolapse of female pelvic organs 06/22/2013  . Neck pain 03/10/2013  . Vaginal atrophy 03/09/2013  . Rectocele 03/09/2013  . Cystocele 03/09/2013  . Female pelvic pain 03/09/2013  . Urinary incontinence 03/09/2013  . COPD (chronic obstructive pulmonary disease) ([place]) 01/16/2011  . CARCINOMA, LUNG, NONSMALL CELL 12/17/2010  . Rheumatoid arthritis ([place]) 12/17/2010  . Dyspnea on exertion 12/17/2010    Current Outpatient Medications on File Prior to Visit  Medication Sig Dispense Refill  . acetaminophen (TYLENOL) 325 MG tablet Take 650 mg by mouth every 6 (six) hours as needed.    . ALPRAZolam (XANAX) 0.25 MG tablet TAKE 1 TABLET BY MOUTH DAILY AS NEEDED FOR ANXIETY 20 tablet 1  . blood glucose meter kit and supplies KIT Dispense based on patient and insurance preference. Use up to four times daily as directed. (FOR E11.9). 1 each 0  . budesonide-formoterol (SYMBICORT) 160-4.5 MCG/ACT inhaler 2 puffs BID 3 Inhaler 3  . DULoxetine (CYMBALTA) 30 MG capsule Take 1 capsule (30 mg total) by mouth daily. 30 capsule 3  . folic acid (FOLVITE) 1 MG tablet Take 1 mg by mouth daily.    Marland Kitchen gabapentin (NEURONTIN) 100 MG capsule Take 2 capsules (200 mg total) by mouth 3 (three) times daily. 540 capsule 1  . glucose blood (ONE TOUCH ULTRA TEST) test strip Use to test blood sugars up to 4 times daily. 400 each 1  . leflunomide (ARAVA) 20 MG tablet leflunomide 20 mg tablet    .  linaclotide (LINZESS) 72 MCG capsule Linzess 72 mcg capsule    . meclizine (ANTIVERT) 25 MG tablet Take 0.5-1 tablets (12.5-25 mg total) by mouth 3 (three) times daily as needed for dizziness. 30 tablet 5  . methotrexate 50 MG/2ML injection INJECT 0.9 ML SUBCUTANEOUS WEEKLY ON THE SAME DAY FOR 30 DAYS  1  . NONFORMULARY OR COMPOUNDED ITEM Estradiol .02% 1 ML Prefilled Applicator Sig: apply vaginally twice a week #90 Day Supply with 4 refills 1  each 0  . ONETOUCH DELICA LANCETS 27C MISC USE TO CHECK SUGARS UP TO 4 TIMES A DAY  0  . predniSONE (DELTASONE) 20 MG tablet prednisone 20 mg tablet    . PROAIR HFA 108 (90 Base) MCG/ACT inhaler USE 1-2 PUFFS EVERY 4-6 HOURS AS NEEDED 8.5 g 1  . ranitidine (ZANTAC) 150 MG tablet TAKE 1 TABLET BY MOUTH EVERY EVENING AT BEDTIME AS NEEDED FOR HEARTBURN 90 tablet 3  . rosuvastatin (CRESTOR) 5 MG tablet Take 1 tablet (5 mg total) by mouth daily. 90 tablet 3  . sertraline (ZOLOFT) 100 MG tablet TAKE 1 TABLET BY MOUTH EVERY DAY 90 tablet 1  . SYNTHROID 88 MCG tablet TAKE 1 TABLET DAILY 90 tablet 1  . Tiotropium Bromide Monohydrate (SPIRIVA RESPIMAT) 2.5 MCG/ACT AERS Inhale 2 puffs into the lungs daily. 1 Inhaler 0  . traMADol (ULTRAM) 50 MG tablet Take 50 mg by mouth 3 (three) times daily as needed.  0  . triamcinolone cream (KENALOG) 0.1 % Apply 1 application daily as needed topically. Thin layer on vulva as needed 30 g 3   No current facility-administered medications on file prior to visit.     Past Medical History:  Diagnosis Date  . BPV (benign positional vertigo) 03/10/2018  . Cancer ([place])    lung ca  . Carotid stenosis, left 09/19/2018  . COPD (chronic obstructive pulmonary disease) ([place])   . Dyspnea   . GERD (gastroesophageal reflux disease)   . Hypercholesteremia   . Memory difficulties 10/28/2015  . Rheumatoid arthritis(714.0)   . Thyroid disease   . Tremor 10/28/2015   Jaw tremor  . Uterine prolapse   . Vitamin D deficiency     Past Surgical History:  Procedure Laterality Date  . APPENDECTOMY    . HEMORRHOID SURGERY    . right lobectomy  10/2004  . TONSILLECTOMY AND ADENOIDECTOMY      Social History   Socioeconomic History  . Marital status: Widowed    Spouse name: Not on file  . Number of children: 3  . Years of education: LPN  . Highest education level: Not on file  Occupational History  . Occupation: retired    Fish farm manager: RETIRED    Comment: LPN at [employer]  . Financial resource strain: Not hard at all  . Food insecurity:    Worry: Never true    Inability: Never true  . Transportation needs:    Medical: No    Non-medical: No  Tobacco Use  . Smoking status: Former Smoker    Packs/day: 1.00    Years: 20.00    Pack years: 20.00    Types: Cigarettes    Last attempt to quit: 11/10/2004    Years since quitting: 13.9  . Smokeless tobacco: Never Used  Substance and Sexual Activity  . Alcohol use: Yes    Alcohol/week: 1.0 standard drinks    Types: 1 Glasses of wine per week    Comment: socially  . Drug use:  No  . Sexual activity: Not Currently    Comment: 1st intercourse- 19, partners- 5,   Lifestyle  . Physical activity:    Days per week: 3 days    Minutes per session: 40 min  . Stress: To some extent  Relationships  . Social connections:    Talks on phone: More than three times a week    Gets together: More than three times a week    Attends religious service: More than 4 times per year    Active member of club or organization: Yes    Attends meetings of clubs or organizations: More than 4 times per year    Relationship status: Widowed  Other Topics Concern  . Not on file  Social History Narrative   Patient drinks about 3 cups of coffee daily.   Patient is right handed.     Family History  Problem Relation Age of Onset  . Emphysema Mother   . Asthma Mother   . Lung cancer Father   . Pancreatic cancer Father   . Bone cancer Father   . Heart disease Brother        x1  . Heart disease Sister        x2  . Stroke Maternal Grandfather     Review of Systems  Constitutional: Negative for chills and fever.  Respiratory: Positive for shortness of breath. Negative for cough and wheezing.   Cardiovascular: Positive for chest pain and palpitations. Negative for leg swelling.  Musculoskeletal: Positive for arthralgias and neck pain.  Neurological: Positive for dizziness, light-headedness and  headaches.       Objective:   Vitals:   10/03/18 1108  BP: (!) 142/78  Pulse: 63  Resp: 14  Temp: 98.4 F (36.9 C)  SpO2: 97%   BP Readings from Last 3 Encounters:  10/03/18 (!) 142/78  09/19/18 (!) 141/60  09/08/18 130/84   Wt Readings from Last 3 Encounters:  10/03/18 131 lb 6.4 oz (59.6 kg)  09/19/18 130 lb 8 oz (59.2 kg)  08/30/18 130 lb (59 kg)   Body mass index is 22.91 kg/m.   Physical Exam    Constitutional: Appears well-developed and well-nourished. No distress.  HENT:  Head: Normocephalic and atraumatic.  Neck: Neck supple. No tracheal deviation present. No thyromegaly present.  No cervical lymphadenopathy Cardiovascular: Normal rate, regular rhythm and normal heart sounds.   No murmur heard. .  No edema Pulmonary/Chest: Effort normal and breath sounds normal. No respiratory distress. No has no wheezes. No rales.  Skin: Skin is warm and dry. Not diaphoretic.  Psychiatric: Normal mood and affect. Behavior is normal.      Assessment & Plan:    See Problem List for Assessment and Plan of chronic medical problems.

## 2018-10-03 ENCOUNTER — Other Ambulatory Visit (INDEPENDENT_AMBULATORY_CARE_PROVIDER_SITE_OTHER): Payer: Medicare Other

## 2018-10-03 ENCOUNTER — Ambulatory Visit (INDEPENDENT_AMBULATORY_CARE_PROVIDER_SITE_OTHER): Payer: Medicare Other | Admitting: Internal Medicine

## 2018-10-03 ENCOUNTER — Encounter: Payer: Self-pay | Admitting: Internal Medicine

## 2018-10-03 VITALS — BP 142/78 | HR 63 | Temp 98.4°F | Resp 14 | Ht 63.5 in | Wt 131.4 lb

## 2018-10-03 DIAGNOSIS — Z23 Encounter for immunization: Secondary | ICD-10-CM | POA: Diagnosis not present

## 2018-10-03 DIAGNOSIS — E038 Other specified hypothyroidism: Secondary | ICD-10-CM

## 2018-10-03 DIAGNOSIS — E119 Type 2 diabetes mellitus without complications: Secondary | ICD-10-CM

## 2018-10-03 DIAGNOSIS — I6523 Occlusion and stenosis of bilateral carotid arteries: Secondary | ICD-10-CM | POA: Diagnosis not present

## 2018-10-03 DIAGNOSIS — K219 Gastro-esophageal reflux disease without esophagitis: Secondary | ICD-10-CM | POA: Diagnosis not present

## 2018-10-03 DIAGNOSIS — I6522 Occlusion and stenosis of left carotid artery: Secondary | ICD-10-CM

## 2018-10-03 DIAGNOSIS — F418 Other specified anxiety disorders: Secondary | ICD-10-CM | POA: Diagnosis not present

## 2018-10-03 LAB — CBC WITH DIFFERENTIAL/PLATELET
Basophils Absolute: 0.1 10*3/uL (ref 0.0–0.1)
Basophils Relative: 1.3 % (ref 0.0–3.0)
Eosinophils Absolute: 0.2 10*3/uL (ref 0.0–0.7)
Eosinophils Relative: 4.7 % (ref 0.0–5.0)
HCT: 36.9 % (ref 36.0–46.0)
Hemoglobin: 12.2 g/dL (ref 12.0–15.0)
Lymphocytes Relative: 40.3 % (ref 12.0–46.0)
Lymphs Abs: 1.9 10*3/uL (ref 0.7–4.0)
MCHC: 33.2 g/dL (ref 30.0–36.0)
MCV: 98.3 fl (ref 78.0–100.0)
Monocytes Absolute: 0.7 10*3/uL (ref 0.1–1.0)
Monocytes Relative: 14 % — ABNORMAL HIGH (ref 3.0–12.0)
Neutro Abs: 1.9 10*3/uL (ref 1.4–7.7)
Neutrophils Relative %: 39.7 % — ABNORMAL LOW (ref 43.0–77.0)
Platelets: 128 10*3/uL — ABNORMAL LOW (ref 150.0–400.0)
RBC: 3.76 Mil/uL — ABNORMAL LOW (ref 3.87–5.11)
RDW: 17.5 % — ABNORMAL HIGH (ref 11.5–15.5)
WBC: 4.7 10*3/uL (ref 4.0–10.5)

## 2018-10-03 LAB — COMPREHENSIVE METABOLIC PANEL
ALT: 9 U/L (ref 0–35)
AST: 17 U/L (ref 0–37)
Albumin: 3.7 g/dL (ref 3.5–5.2)
Alkaline Phosphatase: 71 U/L (ref 39–117)
BUN: 12 mg/dL (ref 6–23)
CO2: 33 mEq/L — ABNORMAL HIGH (ref 19–32)
Calcium: 9.4 mg/dL (ref 8.4–10.5)
Chloride: 104 mEq/L (ref 96–112)
Creatinine, Ser: 0.85 mg/dL (ref 0.40–1.20)
GFR: 67.72 mL/min (ref >60.00)
Glucose, Bld: 101 mg/dL — ABNORMAL HIGH (ref 70–99)
Potassium: 4.1 mEq/L (ref 3.5–5.1)
Sodium: 141 mEq/L (ref 135–145)
Total Bilirubin: 0.5 mg/dL (ref 0.2–1.2)
Total Protein: 6.6 g/dL (ref 6.0–8.3)

## 2018-10-03 LAB — MICROALBUMIN / CREATININE URINE RATIO
Creatinine,U: 120.6 mg/dL
Microalb Creat Ratio: 4.3 mg/g (ref 0.0–30.0)
Microalb, Ur: 5.1 mg/dL — ABNORMAL HIGH (ref 0.0–1.9)

## 2018-10-03 LAB — HEMOGLOBIN A1C: Hgb A1c MFr Bld: 6.4 % (ref 4.6–6.5)

## 2018-10-03 LAB — TSH: TSH: 0.47 u[IU]/mL (ref 0.35–4.50)

## 2018-10-03 MED ORDER — ALPRAZOLAM 0.25 MG PO TABS: 0.2500 mg | ORAL_TABLET | Freq: Every day | ORAL | 1 refills | Status: DC | PRN

## 2018-10-03 MED ORDER — TRIAMCINOLONE ACETONIDE 0.1 % EX CREA: 1.0000 "application " | TOPICAL_CREAM | Freq: Every day | CUTANEOUS | 3 refills | Status: DC | PRN

## 2018-10-03 NOTE — Assessment & Plan Note (Signed)
Clinically euthyroid Check tsh  Titrate med dose if needed  

## 2018-10-03 NOTE — Assessment & Plan Note (Signed)
Diet controlled Not able to exercise, compliant with a diabetic diet Check a1c, urine micro

## 2018-10-03 NOTE — Assessment & Plan Note (Signed)
GERD controlled Continue daily medication  

## 2018-10-03 NOTE — Assessment & Plan Note (Addendum)
Controlled, stable Situational anxiety - rarely takes xanax - will continue Continue cymbalta does

## 2018-10-03 NOTE — Assessment & Plan Note (Signed)
To see vascular this week

## 2018-10-04 ENCOUNTER — Encounter: Payer: Self-pay | Admitting: *Deleted

## 2018-10-04 ENCOUNTER — Encounter: Payer: Self-pay | Admitting: Vascular Surgery

## 2018-10-04 ENCOUNTER — Other Ambulatory Visit: Payer: Self-pay | Admitting: *Deleted

## 2018-10-04 ENCOUNTER — Ambulatory Visit (INDEPENDENT_AMBULATORY_CARE_PROVIDER_SITE_OTHER): Payer: Medicare Other | Admitting: Vascular Surgery

## 2018-10-04 VITALS — BP 100/61 | HR 71 | Temp 97.8°F | Resp 14 | Ht 63.5 in | Wt 128.0 lb

## 2018-10-04 DIAGNOSIS — I6522 Occlusion and stenosis of left carotid artery: Secondary | ICD-10-CM

## 2018-10-04 MED ORDER — CLOPIDOGREL BISULFATE 75 MG PO TABS: 75.0000 mg | ORAL_TABLET | Freq: Every day | ORAL | 5 refills | Status: DC

## 2018-10-04 NOTE — Progress Notes (Signed)
Patient name: Lisa Vega MRN: [id number] DOB: [birth year] Sex: female  REASON FOR CONSULT: Left carotid stenosis  HPI: Lisa Vega is a 84 y.o. female with history of rheumatoid arthritis, COPD, history of small cell lung cancer status post resection with chemo in 2005 (no evidence of disease at this time) that presents for evaluation of high grade left carotid stenosis.  Patient apparently initially had her carotids evaluated by Dr. Fletcher Anon earlier this year when she had a moderate stenosis.  Most recently she has had ongoing headaches as well as some vision changes and some additional evaluation was done including CTA of her neck.  CTA neck showed progression to high-grade left ICA stenosis.  On evaluation today patient states she has been having some increased vision loss that has been intermittent.  At times she describes it as blurriness in both eyes and other times a shade coming down over her left eye.  She does have a history of macular degeneration.  Patient lives at home with her daughter and is independent and remains functional.  Her only other complaint is pulsatile headache.  Past Medical History:  Diagnosis Date  . BPV (benign positional vertigo) 03/10/2018  . Cancer ([place])    lung ca  . Carotid stenosis, left 09/19/2018  . COPD (chronic obstructive pulmonary disease) ([place])   . Dyspnea   . GERD (gastroesophageal reflux disease)   . Hypercholesteremia   . Memory difficulties 10/28/2015  . Rheumatoid arthritis(714.0)   . Thyroid disease   . Tremor 10/28/2015   Jaw tremor  . Uterine prolapse   . Vitamin D deficiency     Past Surgical History:  Procedure Laterality Date  . APPENDECTOMY    . HEMORRHOID SURGERY    . right lobectomy  10/2004  . TONSILLECTOMY AND ADENOIDECTOMY      Family History  Problem Relation Age of Onset  . Emphysema Mother   . Asthma Mother   . Lung cancer Father   . Pancreatic cancer Father   . Bone cancer Father   . Heart disease  Brother        x1  . Heart disease Sister        x2  . Stroke Maternal Grandfather     SOCIAL HISTORY: Social History   Socioeconomic History  . Marital status: Widowed    Spouse name: Not on file  . Number of children: 3  . Years of education: LPN  . Highest education level: Not on file  Occupational History  . Occupation: retired    Fish farm manager: RETIRED    Comment: LPN at [employer]  . Financial resource strain: Not hard at all  . Food insecurity:    Worry: Never true    Inability: Never true  . Transportation needs:    Medical: No    Non-medical: No  Tobacco Use  . Smoking status: Former Smoker    Packs/day: 1.00    Years: 20.00    Pack years: 20.00    Types: Cigarettes    Last attempt to quit: 11/10/2004    Years since quitting: 13.9  . Smokeless tobacco: Never Used  Substance and Sexual Activity  . Alcohol use: Yes    Alcohol/week: 1.0 standard drinks    Types: 1 Glasses of wine per week    Comment: socially  . Drug use: No  . Sexual activity: Not Currently    Comment: 1st intercourse- 19, partners- 5,   Lifestyle  .  Physical activity:    Days per week: 3 days    Minutes per session: 40 min  . Stress: To some extent  Relationships  . Social connections:    Talks on phone: More than three times a week    Gets together: More than three times a week    Attends religious service: More than 4 times per year    Active member of club or organization: Yes    Attends meetings of clubs or organizations: More than 4 times per year    Relationship status: Widowed  . Intimate partner violence:    Fear of current or ex partner: Not on file    Emotionally abused: Not on file    Physically abused: Not on file    Forced sexual activity: Not on file  Other Topics Concern  . Not on file  Social History Narrative   Patient drinks about 3 cups of coffee daily.   Patient is right handed.     Allergies  Allergen Reactions  . Aspirin   .  Codeine     REACTION: hallucinations  . Demerol   . Meperidine Hcl     REACTION: severe GI upset  . Meperidine Hcl     Current Outpatient Medications  Medication Sig Dispense Refill  . acetaminophen (TYLENOL) 325 MG tablet Take 650 mg by mouth every 6 (six) hours as needed.    . ALPRAZolam (XANAX) 0.25 MG tablet Take 1 tablet (0.25 mg total) by mouth daily as needed. for anxiety 20 tablet 1  . blood glucose meter kit and supplies KIT Dispense based on patient and insurance preference. Use up to four times daily as directed. (FOR E11.9). 1 each 0  . budesonide-formoterol (SYMBICORT) 160-4.5 MCG/ACT inhaler 2 puffs BID 3 Inhaler 3  . DULoxetine (CYMBALTA) 30 MG capsule Take 1 capsule (30 mg total) by mouth daily. 30 capsule 3  . folic acid (FOLVITE) 1 MG tablet Take 1 mg by mouth daily.    Marland Kitchen gabapentin (NEURONTIN) 100 MG capsule Take 2 capsules (200 mg total) by mouth 3 (three) times daily. 540 capsule 1  . glucose blood (ONE TOUCH ULTRA TEST) test strip Use to test blood sugars up to 4 times daily. 400 each 1  . leflunomide (ARAVA) 20 MG tablet leflunomide 20 mg tablet    . linaclotide (LINZESS) 72 MCG capsule Linzess 72 mcg capsule    . meclizine (ANTIVERT) 25 MG tablet Take 0.5-1 tablets (12.5-25 mg total) by mouth 3 (three) times daily as needed for dizziness. 30 tablet 5  . methotrexate 50 MG/2ML injection INJECT 0.9 ML SUBCUTANEOUS WEEKLY ON THE SAME DAY FOR 30 DAYS  1  . NONFORMULARY OR COMPOUNDED ITEM Estradiol .02% 1 ML Prefilled Applicator Sig: apply vaginally twice a week #90 Day Supply with 4 refills 1 each 0  . ONETOUCH DELICA LANCETS 70L MISC USE TO CHECK SUGARS UP TO 4 TIMES A DAY  0  . predniSONE (DELTASONE) 20 MG tablet prednisone 20 mg tablet    . PROAIR HFA 108 (90 Base) MCG/ACT inhaler USE 1-2 PUFFS EVERY 4-6 HOURS AS NEEDED 8.5 g 1  . ranitidine (ZANTAC) 150 MG tablet TAKE 1 TABLET BY MOUTH EVERY EVENING AT BEDTIME AS NEEDED FOR HEARTBURN 90 tablet 3  . rosuvastatin  (CRESTOR) 5 MG tablet Take 1 tablet (5 mg total) by mouth daily. 90 tablet 3  . sertraline (ZOLOFT) 100 MG tablet TAKE 1 TABLET BY MOUTH EVERY DAY 90 tablet 1  . SYNTHROID  88 MCG tablet TAKE 1 TABLET DAILY 90 tablet 1  . Tiotropium Bromide Monohydrate (SPIRIVA RESPIMAT) 2.5 MCG/ACT AERS Inhale 2 puffs into the lungs daily. 1 Inhaler 0  . traMADol (ULTRAM) 50 MG tablet Take 50 mg by mouth 3 (three) times daily as needed.  0  . triamcinolone cream (KENALOG) 0.1 % Apply 1 application topically daily as needed. Thin layer on vulva as needed 30 g 3  . clopidogrel (PLAVIX) 75 MG tablet Take 1 tablet (75 mg total) by mouth daily. 30 tablet 5   No current facility-administered medications for this visit.     REVIEW OF SYSTEMS:  '[X]'  denotes positive finding, '[ ]'  denotes negative finding Cardiac  Comments:  Chest pain or chest pressure:    Shortness of breath upon exertion: x   Short of breath when lying flat: x   Irregular heart rhythm:        Vascular    Pain in calf, thigh, or hip brought on by ambulation:    Pain in feet at night that wakes you up from your sleep:     Blood clot in your veins:    Leg swelling:         Pulmonary    Oxygen at home:    Productive cough:     Wheezing:         Neurologic    Sudden weakness in arms or legs:     Sudden numbness in arms or legs:     Sudden onset of difficulty speaking or slurred speech:    Temporary loss of vision in one eye:     Problems with dizziness:         Gastrointestinal    Blood in stool:     Vomited blood:         Genitourinary    Burning when urinating:     Blood in urine:        Psychiatric    Major depression:         Hematologic    Bleeding problems:    Problems with blood clotting too easily:        Skin    Rashes or ulcers:        Constitutional    Fever or chills:      PHYSICAL EXAM: Vitals:   10/04/18 1337 10/04/18 1342  BP: (!) 100/59 100/61  Pulse: 70 71  Resp: 14   Temp: 97.8 F (36.6 C)     TempSrc: Oral   SpO2: 95%   Weight: 58.1 kg   Height: 5' 3.5" (1.613 m)     GENERAL: The patient is a well-nourished female, in no acute distress. The vital signs are documented above. CARDIAC: There is a regular rate and rhythm.  VASCULAR:  2+ radial pulse palpable BUE 2+ femoral pulse palpable bilateral groins 2+ DP papable BLE PULMONARY: There is good air exchange bilaterally without wheezing or rales. ABDOMEN: Soft and non-tender with normal pitched bowel sounds.  MUSCULOSKELETAL: There are no major deformities or cyanosis. NEUROLOGIC: No focal weakness or paresthesias are detected.  CN II-XII grossly intact. SKIN: There are no ulcers or rashes noted. PSYCHIATRIC: The patient has a normal affect.  DATA:   I have independently reviewed her CTA neck that shows a high-grade left ICA stenosis at the carotid bifurcation.  This appears to be a lesion at the level of C4 below her jawline.  Assessment/Plan:  84 year old female that remains independent and very functional that presents with a  high-grade left ICA stenosis noted on recent CTA.  She is describing increased episodes of vision loss in her left eye over the last several months (at times it affects both eyes which is less convincing).  No other focal neurological symptoms.  It certainly raises the question whether or not she now has a symptomatic left ICA lesion with episodes of atheroembolism.  I have recommended a left carotid endarterectomy.  We will plan to proceed with surgery more urgent than normal on Monday, October 14 given I'm uncertain about the nature of her symptoms and whether this represents a truly symptomatic lesion.  I have added Plavix for dual antiplatelet therapy in the meantime.  Discussed risks and benefits of surgery including 1% stroke risk, nerve injury, bleeding, hematoma, etc.   Marty Heck, MD [facility] Office: [phone] Pager: [place]

## 2018-10-05 ENCOUNTER — Ambulatory Visit: Payer: Medicare Other | Admitting: Women's Health

## 2018-10-05 DIAGNOSIS — L718 Other rosacea: Secondary | ICD-10-CM | POA: Diagnosis not present

## 2018-10-05 DIAGNOSIS — H02403 Unspecified ptosis of bilateral eyelids: Secondary | ICD-10-CM | POA: Diagnosis not present

## 2018-10-05 DIAGNOSIS — H1851 Endothelial corneal dystrophy: Secondary | ICD-10-CM | POA: Diagnosis not present

## 2018-10-05 DIAGNOSIS — Z961 Presence of intraocular lens: Secondary | ICD-10-CM | POA: Diagnosis not present

## 2018-10-05 DIAGNOSIS — H26493 Other secondary cataract, bilateral: Secondary | ICD-10-CM | POA: Diagnosis not present

## 2018-10-06 ENCOUNTER — Encounter (HOSPITAL_COMMUNITY)
Admission: RE | Admit: 2018-10-06 | Discharge: 2018-10-06 | Disposition: A | Discharge status: Home / Self Care | Payer: Medicare Other | Source: Ambulatory Visit | Attending: Vascular Surgery | Admitting: Vascular Surgery

## 2018-10-06 ENCOUNTER — Encounter (HOSPITAL_COMMUNITY): Payer: Self-pay

## 2018-10-06 ENCOUNTER — Encounter: Payer: Self-pay | Admitting: Obstetrics & Gynecology

## 2018-10-06 ENCOUNTER — Ambulatory Visit (INDEPENDENT_AMBULATORY_CARE_PROVIDER_SITE_OTHER): Payer: Medicare Other | Admitting: Obstetrics & Gynecology

## 2018-10-06 ENCOUNTER — Other Ambulatory Visit: Payer: Self-pay

## 2018-10-06 VITALS — BP 126/84

## 2018-10-06 DIAGNOSIS — Z01812 Encounter for preprocedural laboratory examination: Secondary | ICD-10-CM | POA: Insufficient documentation

## 2018-10-06 DIAGNOSIS — Z4689 Encounter for fitting and adjustment of other specified devices: Secondary | ICD-10-CM | POA: Diagnosis not present

## 2018-10-06 DIAGNOSIS — R7303 Prediabetes: Secondary | ICD-10-CM | POA: Diagnosis not present

## 2018-10-06 DIAGNOSIS — R350 Frequency of micturition: Secondary | ICD-10-CM

## 2018-10-06 HISTORY — DX: Pneumonia, unspecified organism: J18.9

## 2018-10-06 HISTORY — DX: Anxiety disorder, unspecified: F41.9

## 2018-10-06 HISTORY — DX: Depression, unspecified: F32.A

## 2018-10-06 HISTORY — DX: Prediabetes: R73.03

## 2018-10-06 HISTORY — DX: Unspecified osteoarthritis, unspecified site: M19.90

## 2018-10-06 HISTORY — DX: Major depressive disorder, single episode, unspecified: F32.9

## 2018-10-06 HISTORY — DX: Hypothyroidism, unspecified: E03.9

## 2018-10-06 LAB — URINALYSIS, MICROSCOPIC (REFLEX)

## 2018-10-06 LAB — TYPE AND SCREEN
ABO/RH(D): AB POS
Antibody Screen: NEGATIVE

## 2018-10-06 LAB — BLOOD GAS, ARTERIAL
Acid-Base Excess: 1.8 mmol/L (ref 0.0–2.0)
Bicarbonate: 26 mmol/L (ref 20.0–28.0)
Drawn by: 449841
FIO2: 21
O2 Saturation: 94.6 %
Patient temperature: 98.6
pCO2 arterial: 42.4 mmHg (ref 32.0–48.0)
pH, Arterial: 7.405 (ref 7.350–7.450)
pO2, Arterial: 75.6 mmHg — ABNORMAL LOW (ref 83.0–108.0)

## 2018-10-06 LAB — URINALYSIS, ROUTINE W REFLEX MICROSCOPIC
Bilirubin Urine: NEGATIVE
Glucose, UA: NEGATIVE mg/dL
Ketones, ur: NEGATIVE mg/dL
Nitrite: NEGATIVE
Protein, ur: NEGATIVE mg/dL
Specific Gravity, Urine: <1.005 — ABNORMAL LOW (ref 1.005–1.030)
pH: 5.5 (ref 5.0–8.0)

## 2018-10-06 LAB — SURGICAL PCR SCREEN
MRSA, PCR: NEGATIVE
Staphylococcus aureus: NEGATIVE

## 2018-10-06 LAB — GLUCOSE, CAPILLARY: Glucose-Capillary: 124 mg/dL — ABNORMAL HIGH (ref 70–99)

## 2018-10-06 LAB — PROTIME-INR
INR: 1.08
Prothrombin Time: 13.9 seconds (ref 11.4–15.2)

## 2018-10-06 LAB — ABO/RH: ABO/RH(D): AB POS

## 2018-10-06 LAB — APTT: aPTT: 35 seconds (ref 24–36)

## 2018-10-06 NOTE — Pre-Procedure Instructions (Addendum)
Lisa Vega  10/06/2018    Your procedure is scheduled on Monday, October 14.  Report to [facility] Admitting at 7:30 AM             Your surgery or procedure is scheduled for 9:30 A.M.   Call this number if you have problems the morning of surgery: [phone]  This is the number for the Pre- Surgical Desk.    Remember:  Do not eat or drink after midnight Sunday, October 13.   Take these medicines the morning of surgery with A SIP OF WATER : gabapentin (NEURONTIN) sertraline (ZOLOFT) SYNTHROID Plavix Use Symbicort  Take if needed: traMADol (ULTRAM) meclizine (ANTIVERT)  acetaminophen (TYLENOL) Xanax Proair- bring it with you  STOP taking Aspirin, Aspirin Products (Goody Powder, Excedrin Migraine), Ibuprofen (Advil), Naproxen (Aleve), Vitamins and Herbal Products (ie Fish Oil)                       Special instructions:   [place]- Preparing For Surgery  Before surgery, you can play an important role. Because skin is not sterile, your skin needs to be as free of germs as possible. You can reduce the number of germs on your skin by washing with CHG (chlorahexidine gluconate) Soap before surgery.  CHG is an antiseptic cleaner which kills germs and bonds with the skin to continue killing germs even after washing.    Oral Hygiene is also important to reduce your risk of infection.  Remember - BRUSH YOUR TEETH THE MORNING OF SURGERY WITH YOUR REGULAR TOOTHPASTE  Please do not use if you have an allergy to CHG or antibacterial soaps. If your skin becomes reddened/irritated stop using the CHG.  Do not shave (including legs and underarms) for at least 48 hours prior to first CHG shower. It is OK to shave your face.  Please follow these instructions carefully.   1. Shower the NIGHT BEFORE SURGERY and the MORNING OF SURGERY with CHG.   2. If you chose to wash your hair, wash your hair first as usual with your normal shampoo.  3. After you shampoo, wash your  face and private area with the soap you use at home, then rinse your hair and body thoroughly to remove the shampoo.  4. Use CHG as you would any other liquid soap. You can apply CHG directly to the skin and wash gently with a scrungie or a clean washcloth.   5. Apply the CHG Soap to your body ONLY FROM THE NECK DOWN.  Do not use on open wounds or open sores. Avoid contact with your eyes, ears, mouth and genitals (private parts).   6. Wash thoroughly, paying special attention to the area where your surgery will be performed.  7. Thoroughly rinse your body with warm water from the neck down.  8. DO NOT shower/wash with your normal soap after using and rinsing off the CHG Soap.  9. Pat yourself dry with a CLEAN TOWEL.  10. Wear CLEAN PAJAMAS to bed the night before surgery, wear comfortable clothes the morning of surgery  11. Place CLEAN SHEETS on your bed the night of your first shower and DO NOT SLEEP WITH PETS.  Day of Surgery:  Shower as above  Do not apply any deodorants/lotions, powders or colognes.  Please wear clean clothes to the hospital/surgery center.   Remember to brush your teeth WITH YOUR REGULAR TOOTHPASTE.  Do not wear jewelry, make-up or nail polish.  Do not wear lotions, powders, or perfumes, or deodorant.  Do not shave 48 hours prior to surgery.  Men may shave face and neck.  Do not bring valuables to the hospital.  [facility] is not responsible for any belongings or valuables.  Contacts, dentures or bridgework may not be worn into surgery.  Leave your suitcase in the car.  After surgery it may be brought to your room.  For patients admitted to the hospital, discharge time will be determined by your treatment team.

## 2018-10-06 NOTE — Patient Instructions (Signed)
1. Encounter for pessary maintenance Intact vaginal mucosa and no sign of infection.  Milex ring #4 with support reinserted in the vagina at patient's request.  We will follow-up in 4 months for pessary maintenance.  2. Frequent urination Probably no cystitis with relatively normal urine analysis.  Decision not to treat for now, will wait on urine culture.  Patient agrees with plan. - Urinalysis,Complete w/RFL Culture  Lisa Vega, good seeing you today!  I will inform you of your urine culture results as soon as they are available.

## 2018-10-06 NOTE — Progress Notes (Addendum)
Mrs Arno denies chest pain - no shortness of breath at rest. Patient reports that she is not  taking Rosuvastatin.  I asked if MD stopped it, she said no, she stopped on her on, "I just don't like taking medications." I sent Dr Carlis Abbott and Madison Hickman an inbox message informing them that she had stopped medication.

## 2018-10-06 NOTE — Progress Notes (Signed)
    Lisa Vega [birth year] [id number]        84 y.o.  N2T5T7D2   RP: Pessary counseling and reinsertion  HPI: Seen by Dr Phineas Real 09/08/2018 with pelvic discomfort/vaginal discharge.  Treated with Bactrim, but U. Cultured came back negative.  Wet prep negative.  Removed the pessary and took a break from it from 9/12th to now.  Would like to reinsert the pessary because he was having more difficulty emptying her bladder completely without the pessary in place and felt an uncomfortable bulge in the vagina.  No current symptoms of bladder infection except for mild increase in frequency.  No vaginal discharge.  No vaginal odor, no itching.  No pelvic pain.  No fever.  Scheduled for a left carotid artery surgery on Monday.   OB History  Gravida Para Term Preterm AB Living  5 3     2 3   SAB TAB Ectopic Multiple Live Births  1   1        # Outcome Date GA Lbr Len/2nd Weight Sex Delivery Anes PTL Lv  5 Ectopic           4 SAB           3 Para           2 Para           1 Para             Past medical history,surgical history, problem list, medications, allergies, family history and social history were all reviewed and documented in the EPIC chart.   Directed ROS with pertinent positives and negatives documented in the history of present illness/assessment and plan.  Exam:  Vitals:   10/06/18 1559  BP: 126/84   General appearance:  Normal  Abdomen: Normal  Gynecologic exam: Vulva normal.  Vaginal mucosa intact with normal vaginal secretions.  Milex ring pessary #4 with support put back in place easily.  U/A: Yellow clear, nitrites negative, white blood cells 6-10, red blood cells 0-2, bacteria few.  Urine culture pending.   Assessment/Plan:  84 y.o. [id number]   1. Encounter for pessary maintenance Intact vaginal mucosa and no sign of infection.  Milex ring #4 with support reinserted in the vagina at patient's request.  We will follow-up in 4 months for pessary  maintenance.  2. Frequent urination Probably no cystitis with relatively normal urine analysis.  Decision not to treat for now, will wait on urine culture.  Patient agrees with plan. - Urinalysis,Complete w/RFL Culture  Counseling on above issues and coordination of care more than 50% for 15 minutes.  Princess Bruins MD, 4:09 PM 10/06/2018

## 2018-10-08 LAB — URINE CULTURE
MICRO NUMBER:: 91225170
Result:: NO GROWTH
SPECIMEN QUALITY:: ADEQUATE

## 2018-10-08 LAB — URINALYSIS, COMPLETE W/RFL CULTURE
Bilirubin Urine: NEGATIVE
Glucose, UA: NEGATIVE
Hyaline Cast: NONE SEEN /LPF
Ketones, ur: NEGATIVE
Nitrites, Initial: NEGATIVE
Protein, ur: NEGATIVE
Specific Gravity, Urine: 1.01 (ref 1.001–1.03)
pH: 5.5 (ref 5.0–8.0)

## 2018-10-08 LAB — CULTURE INDICATED

## 2018-10-10 ENCOUNTER — Inpatient Hospital Stay (HOSPITAL_COMMUNITY): Payer: Medicare Other | Admitting: Anesthesiology

## 2018-10-10 ENCOUNTER — Other Ambulatory Visit: Payer: Self-pay

## 2018-10-10 ENCOUNTER — Encounter (HOSPITAL_COMMUNITY)
Admission: RE | Disposition: A | Discharge status: Home / Self Care | Payer: Self-pay | Source: Home / Self Care | Attending: Vascular Surgery

## 2018-10-10 ENCOUNTER — Encounter (HOSPITAL_COMMUNITY): Payer: Self-pay

## 2018-10-10 ENCOUNTER — Inpatient Hospital Stay (HOSPITAL_COMMUNITY)
Admission: RE | Admit: 2018-10-10 | Discharge: 2018-10-11 | DRG: 039 | Disposition: A | Discharge status: Home / Self Care | Payer: Medicare Other | Attending: Vascular Surgery | Admitting: Vascular Surgery

## 2018-10-10 DIAGNOSIS — M069 Rheumatoid arthritis, unspecified: Secondary | ICD-10-CM | POA: Diagnosis not present

## 2018-10-10 DIAGNOSIS — K219 Gastro-esophageal reflux disease without esophagitis: Secondary | ICD-10-CM | POA: Diagnosis present

## 2018-10-10 DIAGNOSIS — I739 Peripheral vascular disease, unspecified: Secondary | ICD-10-CM | POA: Diagnosis not present

## 2018-10-10 DIAGNOSIS — Z79899 Other long term (current) drug therapy: Secondary | ICD-10-CM

## 2018-10-10 DIAGNOSIS — Z7951 Long term (current) use of inhaled steroids: Secondary | ICD-10-CM | POA: Diagnosis not present

## 2018-10-10 DIAGNOSIS — Z902 Acquired absence of lung [part of]: Secondary | ICD-10-CM | POA: Diagnosis not present

## 2018-10-10 DIAGNOSIS — Z7902 Long term (current) use of antithrombotics/antiplatelets: Secondary | ICD-10-CM | POA: Diagnosis not present

## 2018-10-10 DIAGNOSIS — E559 Vitamin D deficiency, unspecified: Secondary | ICD-10-CM | POA: Diagnosis present

## 2018-10-10 DIAGNOSIS — E039 Hypothyroidism, unspecified: Secondary | ICD-10-CM | POA: Diagnosis present

## 2018-10-10 DIAGNOSIS — Z85118 Personal history of other malignant neoplasm of bronchus and lung: Secondary | ICD-10-CM

## 2018-10-10 DIAGNOSIS — J449 Chronic obstructive pulmonary disease, unspecified: Secondary | ICD-10-CM | POA: Diagnosis present

## 2018-10-10 DIAGNOSIS — H3530 Unspecified macular degeneration: Secondary | ICD-10-CM | POA: Diagnosis present

## 2018-10-10 DIAGNOSIS — Z7952 Long term (current) use of systemic steroids: Secondary | ICD-10-CM

## 2018-10-10 DIAGNOSIS — I6522 Occlusion and stenosis of left carotid artery: Principal | ICD-10-CM | POA: Diagnosis present

## 2018-10-10 DIAGNOSIS — Z87891 Personal history of nicotine dependence: Secondary | ICD-10-CM

## 2018-10-10 DIAGNOSIS — Z9221 Personal history of antineoplastic chemotherapy: Secondary | ICD-10-CM

## 2018-10-10 DIAGNOSIS — I6529 Occlusion and stenosis of unspecified carotid artery: Secondary | ICD-10-CM | POA: Diagnosis present

## 2018-10-10 DIAGNOSIS — E7800 Pure hypercholesterolemia, unspecified: Secondary | ICD-10-CM | POA: Diagnosis not present

## 2018-10-10 DIAGNOSIS — Z79890 Hormone replacement therapy (postmenopausal): Secondary | ICD-10-CM | POA: Diagnosis not present

## 2018-10-10 HISTORY — PX: ENDARTERECTOMY: SHX5162

## 2018-10-10 HISTORY — PX: ANGIOPLASTY: SHX39

## 2018-10-10 LAB — HEMOGLOBIN A1C
Hgb A1c MFr Bld: 6.1 % — ABNORMAL HIGH (ref 4.8–5.6)
Mean Plasma Glucose: 128.37 mg/dL

## 2018-10-10 LAB — GLUCOSE, CAPILLARY
Glucose-Capillary: 126 mg/dL — ABNORMAL HIGH (ref 70–99)
Glucose-Capillary: 142 mg/dL — ABNORMAL HIGH (ref 70–99)
Glucose-Capillary: 271 mg/dL — ABNORMAL HIGH (ref 70–99)

## 2018-10-10 LAB — CBC
HCT: 35.8 % — ABNORMAL LOW (ref 36.0–46.0)
Hemoglobin: 11.5 g/dL — ABNORMAL LOW (ref 12.0–15.0)
MCH: 32.5 pg (ref 26.0–34.0)
MCHC: 32.1 g/dL (ref 30.0–36.0)
MCV: 101.1 fL — ABNORMAL HIGH (ref 80.0–100.0)
Platelets: 187 10*3/uL (ref 150–400)
RBC: 3.54 MIL/uL — ABNORMAL LOW (ref 3.87–5.11)
RDW: 16.8 % — ABNORMAL HIGH (ref 11.5–15.5)
WBC: 6.2 10*3/uL (ref 4.0–10.5)
nRBC: 0 % (ref 0.0–0.2)

## 2018-10-10 LAB — CREATININE, SERUM
Creatinine, Ser: 0.86 mg/dL (ref 0.44–1.00)
GFR calc Af Amer: >60 mL/min (ref >60)
GFR calc non Af Amer: >60 mL/min (ref >60)

## 2018-10-10 LAB — POCT ACTIVATED CLOTTING TIME: Activated Clotting Time: 246 seconds

## 2018-10-10 SURGERY — ENDARTERECTOMY, CAROTID
Anesthesia: General | Site: Neck | Laterality: Left

## 2018-10-10 MED ORDER — IBUPROFEN 200 MG PO TABS: 200.0000 mg | ORAL_TABLET | Freq: Four times a day (QID) | ORAL | Status: DC | PRN

## 2018-10-10 MED ORDER — LINACLOTIDE 72 MCG PO CAPS
72.0000 ug | ORAL_CAPSULE | Freq: Every day | ORAL | Status: DC | PRN
  Filled 2018-10-10: qty 1

## 2018-10-10 MED ORDER — FOLIC ACID 1 MG PO TABS
1.0000 mg | ORAL_TABLET | Freq: Every day | ORAL | Status: DC
  Administered 2018-10-11: 1 mg via ORAL
  Filled 2018-10-10: qty 1

## 2018-10-10 MED ORDER — LABETALOL HCL 5 MG/ML IV SOLN
10.0000 mg | INTRAVENOUS | Status: DC | PRN
  Administered 2018-10-10: 10 mg via INTRAVENOUS
  Filled 2018-10-10 (×2): qty 4

## 2018-10-10 MED ORDER — ALBUTEROL SULFATE (2.5 MG/3ML) 0.083% IN NEBU: 3.0000 mL | INHALATION_SOLUTION | Freq: Four times a day (QID) | RESPIRATORY_TRACT | Status: DC | PRN

## 2018-10-10 MED ORDER — HYDRALAZINE HCL 20 MG/ML IJ SOLN
5.0000 mg | INTRAMUSCULAR | Status: DC | PRN
  Administered 2018-10-11: 5 mg via INTRAVENOUS
  Filled 2018-10-10: qty 0.25
  Filled 2018-10-10: qty 1

## 2018-10-10 MED ORDER — MOMETASONE FURO-FORMOTEROL FUM 200-5 MCG/ACT IN AERO
2.0000 | INHALATION_SPRAY | Freq: Two times a day (BID) | RESPIRATORY_TRACT | Status: DC
  Administered 2018-10-10 – 2018-10-11 (×2): 2 via RESPIRATORY_TRACT
  Filled 2018-10-10: qty 8.8

## 2018-10-10 MED ORDER — CHLORHEXIDINE GLUCONATE CLOTH 2 % EX PADS: 6.0000 | MEDICATED_PAD | Freq: Once | CUTANEOUS | Status: DC

## 2018-10-10 MED ORDER — 0.9 % SODIUM CHLORIDE (POUR BTL) OPTIME
TOPICAL | Status: DC | PRN
  Administered 2018-10-10: 2000 mL

## 2018-10-10 MED ORDER — LABETALOL HCL 5 MG/ML IV SOLN
INTRAVENOUS | Status: AC
  Filled 2018-10-10: qty 4

## 2018-10-10 MED ORDER — METOPROLOL TARTRATE 5 MG/5ML IV SOLN
2.0000 mg | INTRAVENOUS | Status: DC | PRN
  Filled 2018-10-10: qty 5

## 2018-10-10 MED ORDER — SODIUM CHLORIDE 0.9 % IV SOLN
INTRAVENOUS | Status: DC
  Administered 2018-10-10: 16:00:00 via INTRAVENOUS

## 2018-10-10 MED ORDER — MORPHINE SULFATE (PF) 2 MG/ML IV SOLN: 0.5000 mg | INTRAVENOUS | Status: DC | PRN

## 2018-10-10 MED ORDER — ALUM & MAG HYDROXIDE-SIMETH 200-200-20 MG/5ML PO SUSP: 15.0000 mL | ORAL | Status: DC | PRN

## 2018-10-10 MED ORDER — POTASSIUM CHLORIDE CRYS ER 20 MEQ PO TBCR: 20.0000 meq | EXTENDED_RELEASE_TABLET | Freq: Every day | ORAL | Status: DC | PRN

## 2018-10-10 MED ORDER — INSULIN ASPART 100 UNIT/ML ~~LOC~~ SOLN
0.0000 [IU] | Freq: Three times a day (TID) | SUBCUTANEOUS | Status: DC
  Administered 2018-10-11: 1 [IU] via SUBCUTANEOUS

## 2018-10-10 MED ORDER — TRAMADOL HCL 50 MG PO TABS
50.0000 mg | ORAL_TABLET | Freq: Three times a day (TID) | ORAL | Status: DC | PRN
  Administered 2018-10-10 – 2018-10-11 (×2): 50 mg via ORAL
  Filled 2018-10-10 (×2): qty 1

## 2018-10-10 MED ORDER — SODIUM CHLORIDE 0.9 % IV SOLN
INTRAVENOUS | Status: DC
  Administered 2018-10-10 (×2): via INTRAVENOUS

## 2018-10-10 MED ORDER — DOCUSATE SODIUM 100 MG PO CAPS
100.0000 mg | ORAL_CAPSULE | Freq: Every day | ORAL | Status: DC
  Administered 2018-10-11: 100 mg via ORAL
  Filled 2018-10-10: qty 1

## 2018-10-10 MED ORDER — ENOXAPARIN SODIUM 30 MG/0.3ML ~~LOC~~ SOLN: 30.0000 mg | SUBCUTANEOUS | Status: DC

## 2018-10-10 MED ORDER — ACETAMINOPHEN 325 MG PO TABS: 650.0000 mg | ORAL_TABLET | Freq: Four times a day (QID) | ORAL | Status: DC | PRN

## 2018-10-10 MED ORDER — PROPOFOL 10 MG/ML IV BOLUS
INTRAVENOUS | Status: DC | PRN
  Administered 2018-10-10: 100 mg via INTRAVENOUS

## 2018-10-10 MED ORDER — HEMOSTATIC AGENTS (NO CHARGE) OPTIME
TOPICAL | Status: DC | PRN
  Administered 2018-10-10: 1 via TOPICAL

## 2018-10-10 MED ORDER — GUAIFENESIN-DM 100-10 MG/5ML PO SYRP: 15.0000 mL | ORAL_SOLUTION | ORAL | Status: DC | PRN

## 2018-10-10 MED ORDER — ROCURONIUM BROMIDE 10 MG/ML (PF) SYRINGE
PREFILLED_SYRINGE | INTRAVENOUS | Status: DC | PRN
  Administered 2018-10-10: 30 mg via INTRAVENOUS
  Administered 2018-10-10: 20 mg via INTRAVENOUS

## 2018-10-10 MED ORDER — SODIUM CHLORIDE 0.9 % IV SOLN: 500.0000 mL | Freq: Once | INTRAVENOUS | Status: DC | PRN

## 2018-10-10 MED ORDER — MECLIZINE HCL 25 MG PO TABS: 12.5000 mg | ORAL_TABLET | Freq: Three times a day (TID) | ORAL | Status: DC | PRN

## 2018-10-10 MED ORDER — FENTANYL CITRATE (PF) 250 MCG/5ML IJ SOLN
INTRAMUSCULAR | Status: AC
  Filled 2018-10-10: qty 5

## 2018-10-10 MED ORDER — CEFAZOLIN SODIUM-DEXTROSE 2-4 GM/100ML-% IV SOLN
2.0000 g | Freq: Three times a day (TID) | INTRAVENOUS | Status: AC
  Administered 2018-10-10 – 2018-10-11 (×2): 2 g via INTRAVENOUS
  Filled 2018-10-10 (×2): qty 100

## 2018-10-10 MED ORDER — HEPARIN SODIUM (PORCINE) 1000 UNIT/ML IJ SOLN
INTRAMUSCULAR | Status: AC
  Filled 2018-10-10: qty 1

## 2018-10-10 MED ORDER — SODIUM CHLORIDE 0.9 % IV SOLN
INTRAVENOUS | Status: DC | PRN
  Administered 2018-10-10: 12:00:00

## 2018-10-10 MED ORDER — TIOTROPIUM BROMIDE MONOHYDRATE 2.5 MCG/ACT IN AERS: 1.0000 | INHALATION_SPRAY | Freq: Every day | RESPIRATORY_TRACT | Status: DC | PRN

## 2018-10-10 MED ORDER — ONDANSETRON HCL 4 MG/2ML IJ SOLN
INTRAMUSCULAR | Status: DC | PRN
  Administered 2018-10-10: 4 mg via INTRAVENOUS

## 2018-10-10 MED ORDER — MAGNESIUM SULFATE 2 GM/50ML IV SOLN: 2.0000 g | Freq: Every day | INTRAVENOUS | Status: DC | PRN

## 2018-10-10 MED ORDER — LACTATED RINGERS IV SOLN
INTRAVENOUS | Status: DC
  Administered 2018-10-10: 08:00:00 via INTRAVENOUS

## 2018-10-10 MED ORDER — DEXAMETHASONE SODIUM PHOSPHATE 10 MG/ML IJ SOLN
INTRAMUSCULAR | Status: DC | PRN
  Administered 2018-10-10: 5 mg via INTRAVENOUS

## 2018-10-10 MED ORDER — PHENOL 1.4 % MT LIQD: 1.0000 | OROMUCOSAL | Status: DC | PRN

## 2018-10-10 MED ORDER — ALPRAZOLAM 0.25 MG PO TABS: 0.2500 mg | ORAL_TABLET | Freq: Every day | ORAL | Status: DC | PRN

## 2018-10-10 MED ORDER — GABAPENTIN 100 MG PO CAPS
100.0000 mg | ORAL_CAPSULE | Freq: Three times a day (TID) | ORAL | Status: DC
  Administered 2018-10-10 – 2018-10-11 (×2): 100 mg via ORAL
  Filled 2018-10-10 (×2): qty 1

## 2018-10-10 MED ORDER — FENTANYL CITRATE (PF) 100 MCG/2ML IJ SOLN: 25.0000 ug | INTRAMUSCULAR | Status: DC | PRN

## 2018-10-10 MED ORDER — SODIUM CHLORIDE 0.9 % IV SOLN
INTRAVENOUS | Status: AC
  Filled 2018-10-10: qty 1.2

## 2018-10-10 MED ORDER — SUGAMMADEX SODIUM 200 MG/2ML IV SOLN
INTRAVENOUS | Status: DC | PRN
  Administered 2018-10-10: 200 mg via INTRAVENOUS

## 2018-10-10 MED ORDER — CLOPIDOGREL BISULFATE 75 MG PO TABS
75.0000 mg | ORAL_TABLET | Freq: Every day | ORAL | Status: DC
  Administered 2018-10-11: 75 mg via ORAL
  Filled 2018-10-10: qty 1

## 2018-10-10 MED ORDER — PROMETHAZINE HCL 25 MG/ML IJ SOLN: 6.2500 mg | INTRAMUSCULAR | Status: DC | PRN

## 2018-10-10 MED ORDER — SENNOSIDES-DOCUSATE SODIUM 8.6-50 MG PO TABS: 1.0000 | ORAL_TABLET | Freq: Every evening | ORAL | Status: DC | PRN

## 2018-10-10 MED ORDER — BISACODYL 5 MG PO TBEC: 5.0000 mg | DELAYED_RELEASE_TABLET | Freq: Every day | ORAL | Status: DC | PRN

## 2018-10-10 MED ORDER — GLYCOPYRROLATE 0.2 MG/ML IJ SOLN
INTRAMUSCULAR | Status: DC | PRN
  Administered 2018-10-10: 0.2 mg via INTRAVENOUS

## 2018-10-10 MED ORDER — LIDOCAINE HCL (PF) 1 % IJ SOLN
INTRAMUSCULAR | Status: AC
  Filled 2018-10-10: qty 30

## 2018-10-10 MED ORDER — CEFAZOLIN SODIUM-DEXTROSE 2-4 GM/100ML-% IV SOLN
2.0000 g | INTRAVENOUS | Status: AC
  Administered 2018-10-10: 2 g via INTRAVENOUS
  Filled 2018-10-10: qty 100

## 2018-10-10 MED ORDER — ONDANSETRON HCL 4 MG/2ML IJ SOLN: 4.0000 mg | Freq: Four times a day (QID) | INTRAMUSCULAR | Status: DC | PRN

## 2018-10-10 MED ORDER — PROTAMINE SULFATE 10 MG/ML IV SOLN
INTRAVENOUS | Status: DC | PRN
  Administered 2018-10-10: 10 mg via INTRAVENOUS
  Administered 2018-10-10: 20 mg via INTRAVENOUS
  Administered 2018-10-10: 10 mg via INTRAVENOUS

## 2018-10-10 MED ORDER — CHLORDIAZEPOXIDE HCL 5 MG PO CAPS: 5.0000 mg | ORAL_CAPSULE | Freq: Two times a day (BID) | ORAL | Status: DC | PRN

## 2018-10-10 MED ORDER — HEPARIN SODIUM (PORCINE) 1000 UNIT/ML IJ SOLN
INTRAMUSCULAR | Status: DC | PRN
  Administered 2018-10-10: 2000 [IU] via INTRAVENOUS
  Administered 2018-10-10: 6000 [IU] via INTRAVENOUS

## 2018-10-10 MED ORDER — LEVOTHYROXINE SODIUM 88 MCG PO TABS
88.0000 ug | ORAL_TABLET | Freq: Every day | ORAL | Status: DC
  Administered 2018-10-11: 88 ug via ORAL
  Filled 2018-10-10: qty 1

## 2018-10-10 MED ORDER — LIDOCAINE 2% (20 MG/ML) 5 ML SYRINGE
INTRAMUSCULAR | Status: DC | PRN
  Administered 2018-10-10: 50 mg via INTRAVENOUS

## 2018-10-10 MED ORDER — PANTOPRAZOLE SODIUM 40 MG PO TBEC
40.0000 mg | DELAYED_RELEASE_TABLET | Freq: Every day | ORAL | Status: DC
  Administered 2018-10-11: 40 mg via ORAL
  Filled 2018-10-10: qty 1

## 2018-10-10 MED ORDER — ROSUVASTATIN CALCIUM 10 MG PO TABS: 5.0000 mg | ORAL_TABLET | Freq: Every day | ORAL | Status: DC

## 2018-10-10 MED ORDER — SODIUM CHLORIDE 0.9 % IV SOLN
INTRAVENOUS | Status: DC | PRN
  Administered 2018-10-10: 20 ug/min via INTRAVENOUS

## 2018-10-10 MED ORDER — FENTANYL CITRATE (PF) 250 MCG/5ML IJ SOLN
INTRAMUSCULAR | Status: DC | PRN
  Administered 2018-10-10 (×2): 50 ug via INTRAVENOUS

## 2018-10-10 MED ORDER — PROPOFOL 10 MG/ML IV BOLUS
INTRAVENOUS | Status: AC
  Filled 2018-10-10: qty 20

## 2018-10-10 MED ORDER — SERTRALINE HCL 50 MG PO TABS
50.0000 mg | ORAL_TABLET | Freq: Every day | ORAL | Status: DC
  Administered 2018-10-11: 50 mg via ORAL
  Filled 2018-10-10: qty 1

## 2018-10-10 MED ORDER — ASPIRIN EC 325 MG PO TBEC
325.0000 mg | DELAYED_RELEASE_TABLET | Freq: Every day | ORAL | Status: DC
  Filled 2018-10-10: qty 1

## 2018-10-10 SURGICAL SUPPLY — 61 items
CANISTER SUCT 3000ML PPV (MISCELLANEOUS) ×2 IMPLANT
CATH ROBINSON RED A/P 18FR (CATHETERS) ×2 IMPLANT
CLIP VESOCCLUDE MED 24/CT (CLIP) ×2 IMPLANT
CLIP VESOCCLUDE SM WIDE 24/CT (CLIP) ×2 IMPLANT
COVER PROBE W GEL 5X96 (DRAPES) IMPLANT
COVER TRANSDUCER ULTRASND GEL (DRAPE) ×2 IMPLANT
COVER WAND RF STERILE (DRAPES) ×2 IMPLANT
CRADLE DONUT ADULT HEAD (MISCELLANEOUS) ×2 IMPLANT
DERMABOND ADVANCED (GAUZE/BANDAGES/DRESSINGS) ×1
DERMABOND ADVANCED .7 DNX12 (GAUZE/BANDAGES/DRESSINGS) ×1 IMPLANT
DRAIN CHANNEL 15F RND FF W/TCR (WOUND CARE) IMPLANT
ELECT REM PT RETURN 9FT ADLT (ELECTROSURGICAL) ×2
ELECTRODE REM PT RTRN 9FT ADLT (ELECTROSURGICAL) ×1 IMPLANT
EVACUATOR SILICONE 100CC (DRAIN) IMPLANT
GLOVE BIO SURGEON STRL SZ 6.5 (GLOVE) ×4 IMPLANT
GLOVE BIO SURGEON STRL SZ7.5 (GLOVE) ×2 IMPLANT
GLOVE BIOGEL PI IND STRL 6.5 (GLOVE) ×1 IMPLANT
GLOVE BIOGEL PI IND STRL 7.0 (GLOVE) ×1 IMPLANT
GLOVE BIOGEL PI IND STRL 7.5 (GLOVE) ×1 IMPLANT
GLOVE BIOGEL PI IND STRL 8 (GLOVE) ×1 IMPLANT
GLOVE BIOGEL PI INDICATOR 6.5 (GLOVE) ×1
GLOVE BIOGEL PI INDICATOR 7.0 (GLOVE) ×1
GLOVE BIOGEL PI INDICATOR 7.5 (GLOVE) ×1
GLOVE BIOGEL PI INDICATOR 8 (GLOVE) ×1
GLOVE ECLIPSE 7.0 STRL STRAW (GLOVE) ×2 IMPLANT
GOWN STRL REUS W/ TWL LRG LVL3 (GOWN DISPOSABLE) ×4 IMPLANT
GOWN STRL REUS W/ TWL XL LVL3 (GOWN DISPOSABLE) ×3 IMPLANT
GOWN STRL REUS W/TWL LRG LVL3 (GOWN DISPOSABLE) ×4
GOWN STRL REUS W/TWL XL LVL3 (GOWN DISPOSABLE) ×3
GRAFT VASC PATCH XENOSURE 1X14 (Vascular Products) ×2 IMPLANT
HEMOSTAT SNOW SURGICEL 2X4 (HEMOSTASIS) ×2 IMPLANT
IV ADAPTER SYR DOUBLE MALE LL (MISCELLANEOUS) IMPLANT
KIT BASIN OR (CUSTOM PROCEDURE TRAY) ×2 IMPLANT
KIT SHUNT ARGYLE CAROTID ART 6 (VASCULAR PRODUCTS) IMPLANT
KIT TURNOVER KIT B (KITS) ×2 IMPLANT
LOOP VESSEL MINI RED (MISCELLANEOUS) IMPLANT
NEEDLE HYPO 25GX1X1/2 BEV (NEEDLE) IMPLANT
NEEDLE SPNL 20GX3.5 QUINCKE YW (NEEDLE) IMPLANT
NS IRRIG 1000ML POUR BTL (IV SOLUTION) ×6 IMPLANT
PACK CAROTID (CUSTOM PROCEDURE TRAY) ×2 IMPLANT
PAD ARMBOARD 7.5X6 YLW CONV (MISCELLANEOUS) ×4 IMPLANT
PENCIL BUTTON HOLSTER BLD 10FT (ELECTRODE) ×2 IMPLANT
SHUNT CAROTID BYPASS 10 (VASCULAR PRODUCTS) ×2 IMPLANT
SHUNT CAROTID BYPASS 12FRX15.5 (VASCULAR PRODUCTS) IMPLANT
SPONGE SURGIFOAM ABS GEL 100 (HEMOSTASIS) IMPLANT
STOPCOCK 4 WAY LG BORE MALE ST (IV SETS) IMPLANT
SUT ETHILON 3 0 PS 1 (SUTURE) IMPLANT
SUT MNCRL AB 4-0 PS2 18 (SUTURE) ×2 IMPLANT
SUT PROLENE 5 0 C 1 24 (SUTURE) ×2 IMPLANT
SUT PROLENE 6 0 BV (SUTURE) ×12 IMPLANT
SUT PROLENE 7 0 BV1 MDA (SUTURE) ×2 IMPLANT
SUT SILK 3 0 (SUTURE)
SUT SILK 3-0 18XBRD TIE 12 (SUTURE) IMPLANT
SUT VIC AB 2-0 CT1 27 (SUTURE) ×1
SUT VIC AB 2-0 CT1 TAPERPNT 27 (SUTURE) ×1 IMPLANT
SUT VIC AB 3-0 SH 27 (SUTURE) ×1
SUT VIC AB 3-0 SH 27X BRD (SUTURE) ×1 IMPLANT
SYR CONTROL 10ML LL (SYRINGE) IMPLANT
TOWEL GREEN STERILE (TOWEL DISPOSABLE) ×2 IMPLANT
TUBING ART PRESS 48 MALE/FEM (TUBING) IMPLANT
WATER STERILE IRR 1000ML POUR (IV SOLUTION) ×2 IMPLANT

## 2018-10-10 NOTE — Anesthesia Preprocedure Evaluation (Signed)
Anesthesia Evaluation  Patient identified by MRN, date of birth, ID band Patient awake    Reviewed: Allergy & Precautions, NPO status , Patient's Chart, lab work & pertinent test results  Airway Mallampati: II  TM Distance: >3 FB Neck ROM: Full    Dental  (+) Dental Advisory Given   Pulmonary COPD, former smoker,    breath sounds clear to auscultation       Cardiovascular + Peripheral Vascular Disease   Rhythm:Regular Rate:Normal     Neuro/Psych Anxiety Depression negative neurological ROS     GI/Hepatic Neg liver ROS, hiatal hernia, GERD  Medicated,  Endo/Other  diabetesHypothyroidism   Renal/GU negative Renal ROS     Musculoskeletal  (+) Arthritis ,   Abdominal   Peds  Hematology negative hematology ROS (+)   Anesthesia Other Findings   Reproductive/Obstetrics                             Lab Results  Component Value Date   WBC 4.7 10/03/2018   HGB 12.2 10/03/2018   HCT 36.9 10/03/2018   MCV 98.3 10/03/2018   PLT 128.0 (L) 10/03/2018   Lab Results  Component Value Date   CREATININE 0.85 10/03/2018   BUN 12 10/03/2018   NA 141 10/03/2018   K 4.1 10/03/2018   CL 104 10/03/2018   CO2 33 (H) 10/03/2018   Study Conclusions 01/2018  - Left ventricle: LV longitudinal strain is -15% There was mild   concentric hypertrophy. Systolic function was mildly reduced. The  estimated ejection fraction was in the range of 45% to 50%. Mild  diffuse hypokinesis. There was an increased relative contribution  of atrial contraction to ventricular filling. Doppler parameters  are consistent with abnormal left ventricular relaxation (grade 1  diastolic dysfunction). - Mitral valve: There was trivial regurgitation. - Impressions: The findings indicate significant septal-lateral   left ventricular wall dyssynchrony due to LV conduction delay. Anesthesia Physical Anesthesia Plan  ASA:  III  Anesthesia Plan: General   Post-op Pain Management:    Induction: Intravenous  PONV Risk Score and Plan: 3 and Dexamethasone, Ondansetron and Treatment may vary due to age or medical condition  Airway Management Planned: Oral ETT  Additional Equipment: Arterial line  Intra-op Plan:   Post-operative Plan: Extubation in OR  Informed Consent: I have reviewed the patients History and Physical, chart, labs and discussed the procedure including the risks, benefits and alternatives for the proposed anesthesia with the patient or authorized representative who has indicated his/her understanding and acceptance.   Dental advisory given  Plan Discussed with: CRNA  Anesthesia Plan Comments:         Anesthesia Quick Evaluation

## 2018-10-10 NOTE — H&P (Signed)
History and Physical Interval Note:  10/10/2018 7:44 AM  Lisa Vega  has presented today for surgery, with the diagnosis of left carotid stenosis  The various methods of treatment have been discussed with the patient and family. After consideration of risks, benefits and other options for treatment, the patient has consented to  Procedure(s): ENDARTERECTOMY CAROTID (Left) as a surgical intervention .  The patient's history has been reviewed, patient examined, no change in status, stable for surgery.  I have reviewed the patient's chart and labs.  Questions were answered to the patient's satisfaction.    Left carotid endarterectomy  Marty Heck    Patient name: Lisa Vega      MRN: [id number]        DOB: 1932-05-21          Sex: female     REASON FOR CONSULT: Left carotid stenosis     HPI:  Lisa Vega is a 84 y.o. female with history of rheumatoid arthritis, COPD, history of small cell lung cancer status post resection with chemo in 2005 (no evidence of disease at this time) that presents for evaluation of high grade left carotid stenosis.  Patient apparently initially had her carotids evaluated by Dr. Fletcher Anon earlier this year when she had a moderate stenosis.  Most recently she has had ongoing headaches as well as some vision changes and some additional evaluation was done including CTA of her neck.  CTA neck showed progression to high-grade left ICA stenosis.  On evaluation today patient states she has been having some increased vision loss that has been intermittent.  At times she describes it as blurriness in both eyes and other times a shade coming down over her left eye.  She does have a history of macular degeneration.  Patient lives at home with her daughter and is independent and remains functional.  Her only other complaint is pulsatile headache.          Past Medical History:    Diagnosis   Date    .   BPV (benign positional vertigo)    03/10/2018    .   Cancer ([facility])            lung ca    .   Carotid stenosis, left   09/19/2018    .   COPD (chronic obstructive pulmonary disease) ([place])        .   Dyspnea        .   GERD (gastroesophageal reflux disease)        .   Hypercholesteremia        .   Memory difficulties   10/28/2015    .   Rheumatoid arthritis(714.0)        .   Thyroid disease        .   Tremor   10/28/2015        Jaw tremor    .   Uterine prolapse        .   Vitamin D deficiency                    Past Surgical History:    Procedure   Laterality   Date    .   APPENDECTOMY            .   HEMORRHOID SURGERY            .   right lobectomy       10/2004    .  TONSILLECTOMY AND ADENOIDECTOMY                        Family History    Problem   Relation   Age of Onset    .   Emphysema   Mother        .   Asthma   Mother        .   Lung cancer   Father        .   Pancreatic cancer   Father        .   Bone cancer   Father        .   Heart disease   Brother                x1    .   Heart disease   Sister                x2    .   Stroke   Maternal Grandfather              SOCIAL HISTORY:   Social History             Socioeconomic History    .   Marital status:   Widowed            Spouse name:   Not on file    .   Number of children:   3    .   Years of education:   LPN    .   Highest education level:   Not on file    Occupational History    .   Occupation:   retired            Fish farm manager:   RETIRED            Comment: LPN at [employer]    .   Financial resource strain:   Not hard at all    .   Food insecurity:            Worry:   Never true            Inability:    Never true    .   Transportation needs:            Medical:   No            Non-medical:   No    Tobacco Use    .   Smoking status:   Former Smoker            Packs/day:   1.00            Years:   20.00            Pack years:   20.00            Types:   Cigarettes            Last attempt to quit:   11/10/2004            Years since quitting:   13.9    .   Smokeless tobacco:   Never Used    Substance and Sexual Activity    .   Alcohol use:   Yes            Alcohol/week:   1.0 standard drinks            Types:   1 Glasses  of wine per week            Comment: socially    .   Drug use:   No    .   Sexual activity:   Not Currently            Comment: 1st intercourse- 19, partners- 5,     Lifestyle    .   Physical activity:            Days per week:   3 days            Minutes per session:   40 min    .   Stress:   To some extent    Relationships    .   Social connections:            Talks on phone:   More than three times a week            Gets together:   More than three times a week            Attends religious service:   More than 4 times per year            Active member of club or organization:   Yes            Attends meetings of clubs or organizations:   More than 4 times per year            Relationship status:   Widowed    .   Intimate partner violence:            Fear of current or ex partner:   Not on file            Emotionally abused:   Not on file            Physically abused:   Not on file            Forced sexual activity:   Not on file    Other Topics   Concern    .   Not on file    Social History Narrative        Patient drinks about 3 cups of coffee daily.        Patient is right handed.                  Allergies    Allergen   Reactions    .   Aspirin        .   Codeine                REACTION: hallucinations    .   Demerol        .   Meperidine Hcl                REACTION: severe GI upset    .   Meperidine Hcl                     Current Outpatient Medications    Medication   Sig   Dispense   Refill    .   acetaminophen (TYLENOL) 325 MG tablet   Take 650 mg by mouth every 6 (six) hours as needed.            .   ALPRAZolam (XANAX) 0.25 MG tablet   Take 1 tablet (0.25 mg total) by mouth daily as needed. for anxiety   20 tablet   1    .  blood glucose meter kit and supplies KIT   Dispense based on patient and insurance preference. Use up to four times daily as directed. (FOR E11.9).   1 each   0    .   budesonide-formoterol (SYMBICORT) 160-4.5 MCG/ACT inhaler   2 puffs BID   3 Inhaler   3    .   DULoxetine (CYMBALTA) 30 MG capsule   Take 1 capsule (30 mg total) by mouth daily.   30 capsule   3    .   folic acid (FOLVITE) 1 MG tablet   Take 1 mg by mouth daily.            Marland Kitchen   gabapentin (NEURONTIN) 100 MG capsule   Take 2 capsules (200 mg total) by mouth 3 (three) times daily.   540 capsule   1    .   glucose blood (ONE TOUCH ULTRA TEST) test strip   Use to test blood sugars up to 4 times daily.   400 each   1    .   leflunomide (ARAVA) 20 MG tablet   leflunomide 20 mg tablet            .   linaclotide (LINZESS) 72 MCG capsule   Linzess 72 mcg capsule            .   meclizine (ANTIVERT) 25 MG tablet   Take 0.5-1 tablets (12.5-25 mg total) by mouth 3 (three) times daily as needed for dizziness.   30 tablet   5    .   methotrexate 50 MG/2ML injection   INJECT 0.9 ML SUBCUTANEOUS WEEKLY ON THE SAME DAY FOR 30 DAYS       1    .   NONFORMULARY OR COMPOUNDED ITEM   Estradiol .02%  1 ML Prefilled  Applicator  Sig: apply vaginally twice a week  #90 Day Supply with 4 refills   1 each   0    .   ONETOUCH DELICA LANCETS 48J MISC   USE TO CHECK SUGARS UP TO 4 TIMES A DAY       0    .   predniSONE (DELTASONE) 20 MG tablet   prednisone 20 mg tablet            .   PROAIR HFA 108 (90 Base) MCG/ACT inhaler   USE 1-2 PUFFS EVERY 4-6 HOURS AS NEEDED   8.5 g   1    .   ranitidine (ZANTAC) 150 MG tablet   TAKE 1 TABLET BY MOUTH EVERY EVENING AT BEDTIME AS NEEDED FOR HEARTBURN   90 tablet   3    .   rosuvastatin (CRESTOR) 5 MG tablet   Take 1 tablet (5 mg total) by mouth daily.   90 tablet   3    .   sertraline (ZOLOFT) 100 MG tablet   TAKE 1 TABLET BY MOUTH EVERY DAY   90 tablet   1    .   SYNTHROID 88 MCG tablet   TAKE 1 TABLET DAILY   90 tablet   1    .   Tiotropium Bromide Monohydrate (SPIRIVA RESPIMAT) 2.5 MCG/ACT AERS   Inhale 2 puffs into the lungs daily.   1 Inhaler   0    .   traMADol (ULTRAM) 50 MG tablet   Take 50 mg by mouth 3 (three) times daily as needed.       0    .  triamcinolone cream (KENALOG) 0.1 %   Apply 1 application topically daily as needed. Thin layer on vulva as needed   30 g   3    .   clopidogrel (PLAVIX) 75 MG tablet   Take 1 tablet (75 mg total) by mouth daily.   30 tablet   5        No current facility-administered medications for this visit.           REVIEW OF SYSTEMS:   _0  denotes positive finding, _1  denotes negative finding   Cardiac       Comments:    Chest pain or chest pressure:            Shortness of breath upon exertion:   x        Short of breath when lying flat:   x        Irregular heart rhythm:                         Vascular            Pain in calf, thigh, or hip brought on by ambulation:            Pain in feet at night that wakes you up from your sleep:              Blood clot in your veins:            Leg swelling:                          Pulmonary            Oxygen at home:            Productive cough:             Wheezing:                          Neurologic            Sudden weakness in arms or legs:             Sudden numbness in arms or legs:             Sudden onset of difficulty speaking or slurred speech:            Temporary loss of vision in one eye:             Problems with dizziness:                          Gastrointestinal            Blood in stool:             Vomited blood:                          Genitourinary            Burning when urinating:             Blood in urine:                         Psychiatric            Major depression:  Hematologic            Bleeding problems:            Problems with blood clotting too easily:                         Skin            Rashes or ulcers:                         Constitutional            Fever or chills:                  PHYSICAL EXAM:       Vitals:        10/04/18 1337   10/04/18 1342    BP:   (!) 100/59   100/61    Pulse:   70   71    Resp:   14        Temp:   97.8 F (36.6 C)        TempSrc:   Oral        SpO2:   95%        Weight:   58.1 kg        Height:   5' 3.5" (1.613 m)              GENERAL: The patient is a well-nourished female, in no acute distress. The vital signs are documented above.  CARDIAC: There is a regular rate and rhythm.   VASCULAR:   2+ radial pulse palpable BUE  2+ femoral pulse palpable bilateral groins  2+ DP papable BLE  PULMONARY: There is good air exchange bilaterally without wheezing or rales.  ABDOMEN: Soft and  non-tender with normal pitched bowel sounds.   MUSCULOSKELETAL: There are no major deformities or cyanosis.  NEUROLOGIC: No focal weakness or paresthesias are detected.  CN II-XII grossly intact.  SKIN: There are no ulcers or rashes noted.  PSYCHIATRIC: The patient has a normal affect.     DATA:      I have independently reviewed her CTA neck that shows a high-grade left ICA stenosis at the carotid bifurcation.  This appears to be a lesion at the level of C4 below her jawline.     Assessment/Plan:     84 year old female that remains independent and very functional that presents with a high-grade left ICA stenosis noted on recent CTA.  She is describing increased episodes of vision loss in her left eye over the last several months (at times it affects both eyes which is less convincing).  No other focal neurological symptoms.  It certainly raises the question whether or not she now has a symptomatic left ICA lesion with episodes of atheroembolism.  I have recommended a left carotid endarterectomy.  We will plan to proceed with surgery more urgent than normal on Monday, October 14 given I'm uncertain about the nature of her symptoms and whether this represents a truly symptomatic lesion.  I have added Plavix for dual antiplatelet therapy in the meantime.  Discussed risks and benefits of surgery including 1% stroke risk, nerve injury, bleeding, hematoma, etc.        Marty Heck, MD  [facility]  Office: [phone]  Pager: [phone]

## 2018-10-10 NOTE — Progress Notes (Signed)
Pt received from PACU. VSS. Telemetry applied. CHG complete. Pt neurally intact. Incision, clean and intact. Pt and family oriented to room and unit. Will continue to monitor.  Clyde Canterbury, RN

## 2018-10-10 NOTE — Op Note (Signed)
OPERATIVE NOTE  PROCEDURE:   1.  left carotid endarterectomy with bovine patch angioplasty 2.  left intraoperative carotid ultrasound  PRE-OPERATIVE DIAGNOSIS: left high grade symptomatic carotid stenosis  POST-OPERATIVE DIAGNOSIS: same as above   SURGEON: Marty Heck, MD  ASSISTANT(S): Roxy Horseman, PA  ANESTHESIA: general  ESTIMATED BLOOD LOSS: <50 cc  FINDING(S): 1.  Continuous Doppler audible flow signatures are appropriate for each carotid artery. 2.  No evidence of intimal flap visualized on transverse or longitudinal ultrasonography. 3.  Two tandem high grade carotid plaques in the left proximal ICA.  SPECIMEN(S):  Carotid plaque (sent to Pathology)  INDICATIONS:   Lisa Vega is a 84 y.o. female who presents with suspected left symptomatic carotid stenosis >80%.  She describes episodes of amaurosis fugax over the past month and CTA showed high grade left ICA stenosis.  I discussed with the patient the risks, benefits, and alternatives to carotid endarterectomy.  I discussed the procedural details of carotid endarterectomy with the patient.  The patient is aware that the risks of carotid endarterectomy include but are not limited to: bleeding, infection, stroke, myocardial infarction, death, cranial nerve injuries both temporary and permanent, neck hematoma, possible airway compromise, labile blood pressure post-operatively, cerebral hyperperfusion syndrome, and possible need for additional interventions in the future. The patient is aware of the risks and agrees to proceed forward with the procedure.  DESCRIPTION: After full informed written consent was obtained from the patient, the patient was brought back to the operating room and placed supine upon the operating table.  Prior to induction, the patient received IV antibiotics.  After obtaining adequate anesthesia, the patient was placed into semi-Fowler position with a shoulder roll in place and the  patient's neck slightly hyperextended and rotated away from the surgical site.  The patient was prepped in the standard fashion for a left carotid endarterectomy.  I made an incision anterior to the sternocleidomastoid muscle and dissected down through the subcutaneous tissue.  The platysma was opened with electrocautery.  I then used Bovie cautery and blunt dissection to dissect through the underlying platysma and to mobilize the anterior border of the sternocleidomastoid as well as the internal jugular vein laterally.  The facial vein was ligated with 3-0 silk and surgical clips and divided.  After identifying the carotid artery I used Metzenbaum scissors to bluntly dissect the common carotid artery and then controlled this with both a large vessel loop and a umbilical tape.  At this point in time the patient was given 100 units/kg of IV heparin and we checked an ACT to ensure it was greater than 250.  I then carried my dissection cephalad and mobilized the external carotid artery and superior thyroid artery and controlled each of these with a vessel loop.  I then dissected out the internal carotid artery well past the distal plaque.  The internal carotid artery was then controlled with a umbilical tape as well. I was careful to identify the vagus nerve between the internal jugular and common carotid and this was presereved.  I was also careful to identify and preserve the hypoglossal nerve and this was preserved.    Once our ACT was confirmed, I proceeded by clamping the internal carotid artery with a bulldog clamp first.  The proximal common carotid artery was controlled with a Debakey clamp.  The external carotid was controlled with a vessel loop.  I subsequently opened the common carotid artery with an 11 blade scalpel in longitudinal fashion  and extended the arteriotomy with Potts scissors onto the ICA past the distal plaque.  At this point a 10 [place] shunt was brought to the field and then initially  placed distally into the ICA after removing the clamp and controlled with a umbilical tape. The shunt was back bleed with good flow.  I then placed the proximal end of the shunt in the common carotid artery and controlled this with a Rummel tourniquet.  I used Doppler to confirm there was flow in the shunt. I then used a Garment/textile technologist and performed a endarterectomy starting in the common carotid artery.  The external carotid artery was endarterectomized with an eversion technique and I was careful to feather the distal ICA plaque.  The specimen was passed off the field.  The endarterectomy site was then flushed with heparinized saline and I was careful to ensure there were no flaps in the endarterectomy site.  I did place several tacking sutures on the distal endpoint with 7-0 prolene.  I then brought a bovine carotid patch on the field and this was sewn in place with a running anastomosis using a 6-0 Prolene distally and a 5-0 proximal.  The bovine patch was trimmed accordingly.  The shunt was removed just before completion of the patch.  The artery was flushed antegrade and retrograde prior to completion of the patch.  Once the patch was complete, I flushed up the external carotid artery first prior to releasing the internal carotid artery clamp.  An intraoperative duplex was performed that showed no intimal flaps.  A doppler also had good waveforms in ECA and ICA distal to repair.  Once I was happy with the intraoperative ultrasound and dopple the patient was given protamine for reversal.  I used Surgicel Snow to get hemostasis around the patch.  Ultimately the platysma was closed in running fashion with 3-0 Vicryl.  The skin was closed with a running 4-0 Monocryl.  Dermabond was applied with a dry sterile dressing.  The patient was awakened from anesthesia with no new neurological deficit and taken to PACU in stable condition.    COMPLICATIONS: None  CONDITION: Stable  Marty Heck,  MD [facility] Office: [phone] Pager: [phone]  10/10/2018, 1:14 PM

## 2018-10-10 NOTE — Anesthesia Procedure Notes (Signed)
Procedure Name: Intubation Date/Time: 10/10/2018 11:16 AM Performed by: Mariea Clonts, CRNA Pre-anesthesia Checklist: Patient identified, Emergency Drugs available, Suction available and Patient being monitored Patient Re-evaluated:Patient Re-evaluated prior to induction Oxygen Delivery Method: Circle System Utilized Preoxygenation: Pre-oxygenation with 100% oxygen Induction Type: IV induction Ventilation: Mask ventilation without difficulty Laryngoscope Size: Mac and 3 Grade View: Grade I Tube type: Oral Tube size: 7.0 mm Number of attempts: 1 Airway Equipment and Method: Stylet and Oral airway Placement Confirmation: ETT inserted through vocal cords under direct vision,  positive ETCO2 and breath sounds checked- equal and bilateral Tube secured with: Tape Dental Injury: Teeth and Oropharynx as per pre-operative assessment

## 2018-10-10 NOTE — Anesthesia Procedure Notes (Signed)
Arterial Line Insertion Start/End10/14/2019 9:56 AM, 10/10/2018 10:00 AM Performed by: Nyra Market, MD, CRNA  Patient location: Pre-op. Preanesthetic checklist: patient identified, IV checked, site marked, risks and benefits discussed, surgical consent, monitors and equipment checked, pre-op evaluation, timeout performed and anesthesia consent Lidocaine 1% used for infiltration Right, radial was placed Catheter size: 20 G Hand hygiene performed  and maximum sterile barriers used  Allen's test indicative of satisfactory collateral circulation Attempts: 1 Procedure performed without using ultrasound guided technique. Following insertion, dressing applied and Biopatch. Post procedure assessment: normal  Patient tolerated the procedure well with no immediate complications.

## 2018-10-10 NOTE — Anesthesia Postprocedure Evaluation (Signed)
Anesthesia Post Note  Patient: Lisa Vega  Procedure(s) Performed: ENDARTERECTOMY CAROTID (Left Neck) ANGIOPLASTY using 1cm x 14cm Xenosure Patch (Left Neck)     Patient location during evaluation: PACU Anesthesia Type: General Level of consciousness: awake and alert Pain management: pain level controlled Vital Signs Assessment: post-procedure vital signs reviewed and stable Respiratory status: spontaneous breathing, nonlabored ventilation, respiratory function stable and patient connected to nasal cannula oxygen Cardiovascular status: blood pressure returned to baseline and stable Postop Assessment: no apparent nausea or vomiting Anesthetic complications: no    Last Vitals:  Vitals:   10/10/18 1426 10/10/18 1440  BP: (!) 153/63 (!) 140/57  Pulse: (!) 57 61  Resp: 19 18  Temp:  36.6 C  SpO2: 93% 94%    Last Pain:  Vitals:   10/10/18 1440  TempSrc:   PainSc: Tyler Deis

## 2018-10-10 NOTE — Transfer of Care (Signed)
Immediate Anesthesia Transfer of Care Note  Patient: Lisa Vega  Procedure(s) Performed: ENDARTERECTOMY CAROTID (Left Neck) ANGIOPLASTY using 1cm x 14cm Xenosure Patch (Left Neck)  Patient Location: PACU  Anesthesia Type:General  Level of Consciousness: awake, alert  and oriented  Airway & Oxygen Therapy: Patient Spontanous Breathing and Patient connected to nasal cannula oxygen  Post-op Assessment: Report given to RN, Post -op Vital signs reviewed and stable and Patient moving all extremities X 4  Post vital signs: Reviewed and stable  Last Vitals:  Vitals Value Taken Time  BP    Temp    Pulse 58 10/10/2018  1:41 PM  Resp 20 10/10/2018  1:41 PM  SpO2 95 % 10/10/2018  1:41 PM  Vitals shown include unvalidated device data.  Last Pain:  Vitals:   10/10/18 0758  TempSrc: Oral  PainSc: 0-No pain         Complications: No apparent anesthesia complications

## 2018-10-11 ENCOUNTER — Telehealth: Payer: Self-pay | Admitting: *Deleted

## 2018-10-11 ENCOUNTER — Encounter (HOSPITAL_COMMUNITY): Payer: Self-pay | Admitting: Vascular Surgery

## 2018-10-11 LAB — BASIC METABOLIC PANEL
Anion gap: 7 (ref 5–15)
BUN: 8 mg/dL (ref 8–23)
CO2: 25 mmol/L (ref 22–32)
Calcium: 8.5 mg/dL — ABNORMAL LOW (ref 8.9–10.3)
Chloride: 108 mmol/L (ref 98–111)
Creatinine, Ser: 0.82 mg/dL (ref 0.44–1.00)
GFR calc Af Amer: >60 mL/min (ref >60)
GFR calc non Af Amer: >60 mL/min (ref >60)
Glucose, Bld: 125 mg/dL — ABNORMAL HIGH (ref 70–99)
Potassium: 3.7 mmol/L (ref 3.5–5.1)
Sodium: 140 mmol/L (ref 135–145)

## 2018-10-11 LAB — CBC
HCT: 32 % — ABNORMAL LOW (ref 36.0–46.0)
Hemoglobin: 10.4 g/dL — ABNORMAL LOW (ref 12.0–15.0)
MCH: 32.3 pg (ref 26.0–34.0)
MCHC: 32.5 g/dL (ref 30.0–36.0)
MCV: 99.4 fL (ref 80.0–100.0)
Platelets: 196 10*3/uL (ref 150–400)
RBC: 3.22 MIL/uL — ABNORMAL LOW (ref 3.87–5.11)
RDW: 16.7 % — ABNORMAL HIGH (ref 11.5–15.5)
WBC: 8.3 10*3/uL (ref 4.0–10.5)
nRBC: 0 % (ref 0.0–0.2)

## 2018-10-11 LAB — GLUCOSE, CAPILLARY: Glucose-Capillary: 124 mg/dL — ABNORMAL HIGH (ref 70–99)

## 2018-10-11 NOTE — Telephone Encounter (Signed)
Pt was on TCM report admitted 10/10/18 for procedure left carotid endarterectomy. Pt was D/C 10/11/18 and will  follow up with Marty Heck, MD (Vascular Surgery) in 3 weeks (11/01/2018. No need for TCM appt w/PCP.Marland KitchenJohny Chess

## 2018-10-11 NOTE — Consult Note (Signed)
            [facility] CM Primary Care Navigator  10/11/2018  Lisa Vega August 19, 1932 [id number]   Went to see patient at the bedside to identify possible discharge needs but she wasalreadydischarged home per staff.  Patient presented for surgery with diagnosis of left carotid stenosis (had ongoing headaches and has been having some increased vision loss that has been intermittent) and underwent left carotid endarterectomy.  Primary care provider's office is listed as providingtransition of care (TOC) follow-up.  Patient has discharge instruction to follow-up with vascular surgery in 2- 3 weeks.   For additional questions please contact:  Edwena Felty A. Avantika Shere, BSN, RN-BC [facility] PRIMARY CARE Navigator Cell: [phone]

## 2018-10-11 NOTE — Progress Notes (Signed)
   10/11/18 1000  Clinical Encounter Type  Visited With Family  Visit Type Initial  Referral From Other (Comment)  Stress Factors  Family Stress Factors Other (Comment)   While doing rounds I noticed PT family outside room. Son said PT was getting dressed to be discharged. Son was encouraged and thankful for the care of all staff for his Mother in law. He as thankful for visit. I offered spiritual care with words of encouragement and ministry of presence.   Chaplain Fidel Levy

## 2018-10-11 NOTE — Discharge Instructions (Signed)
° °  [facility] [place] ° °Discharge Instructions °  °Carotid Endarterectomy (CEA) ° °Please refer to the following instructions for your post-procedure care. Your surgeon or physician assistant will discuss any changes with you. ° °Activity ° °You are encouraged to walk as much as you can. You can slowly return to normal activities but must avoid strenuous activity and heavy lifting until your doctor tell you it's okay. Avoid activities such as vacuuming or swinging a golf club. You can drive after one week if you are comfortable and you are no longer taking prescription pain medications. It is normal to feel tired for serval weeks after your surgery. It is also normal to have difficulty with sleep habits, eating, and bowel movements after surgery. These will go away with time. ° °Bathing/Showering ° °Shower daily after you go home. Do not soak in a bathtub, hot tub, or swim until the incision heals completely. ° °Incision Care ° °Shower every day. Clean your incision with mild soap and water. Pat the area dry with a clean towel. You do not need a bandage unless otherwise instructed. Do not apply any ointments or creams to your incision. You may have skin glue on your incision. Do not peel it off. It will come off on its own in about one week. Your incision may feel thickened and raised for several weeks after your surgery. This is normal and the skin will soften over time.  ° °For Men Only: It's okay to shave around the incision but do not shave the incision itself for 2 weeks. It is common to have numbness under your chin that could last for several months. ° °Diet ° °Resume your normal diet. There are no special food restrictions following this procedure. A low fat/low cholesterol diet is recommended for all patients with vascular disease. In order to heal from your surgery, it is CRITICAL to get adequate nutrition. Your body requires vitamins, minerals, and protein. Vegetables are the  best source of vitamins and minerals. Vegetables also provide the perfect balance of protein. Processed food has little nutritional value, so try to avoid this. ° °Medications ° °Resume taking all of your medications unless your doctor or physician assistant tells you not to. If your incision is causing pain, you may take over-the- counter pain relievers such as acetaminophen (Tylenol). If you were prescribed a stronger pain medication, please be aware these medications can cause nausea and constipation. Prevent nausea by taking the medication with a snack or meal. Avoid constipation by drinking plenty of fluids and eating foods with a high amount of fiber, such as fruits, vegetables, and grains.  °Do not take Tylenol if you are taking prescription pain medications. ° °Follow Up ° °Our office will schedule a follow up appointment 2-3 weeks following discharge. ° °Please call us immediately for any of the following conditions ° °Increased pain, redness, drainage (pus) from your incision site. °Fever of 101 degrees or higher. °If you should develop stroke (slurred speech, difficulty swallowing, weakness on one side of your body, loss of vision) you should call 911 and go to the nearest emergency room. ° °Reduce your risk of vascular disease: ° °Stop smoking. If you would like help call [facility] at 1-800-QUIT-NOW (1-800-784-8669) or [place] at 336-586-4000. °Manage your cholesterol °Maintain a desired weight °Control your diabetes °Keep your blood pressure down ° °If you have any questions, please call the office at [phone]. ° °

## 2018-10-11 NOTE — Progress Notes (Addendum)
  Progress Note    10/11/2018 7:16 AM 1 Day Post-Op  Subjective:  Feels good this morning-only a little soreness at her incision  Afebrile HR  50's-70's NSR 094'B-096'G systolic 83% [id number]  Vitals:   10/10/18 2029 10/11/18 0000  BP:  (!) 148/54  Pulse: 68 61  Resp: 15 18  Temp: 97.9 F (36.6 C) 98 F (36.7 C)  SpO2: 96% 98%     Physical Exam: Neuro:  In tact; tongue midline Lungs:  Non labored Incision:  Clean and dry; no hematoma  CBC    Component Value Date/Time   WBC 8.3 10/11/2018 0415   RBC 3.22 (L) 10/11/2018 0415   HGB 10.4 (L) 10/11/2018 0415   HGB 12.6 08/10/2011 1457   HCT 32.0 (L) 10/11/2018 0415   HCT 36.9 08/10/2011 1457   PLT 196 10/11/2018 0415   PLT 188 08/10/2011 1457   MCV 99.4 10/11/2018 0415   MCV 99.5 08/10/2011 1457   MCH 32.3 10/11/2018 0415   MCHC 32.5 10/11/2018 0415   RDW 16.7 (H) 10/11/2018 0415   RDW 14.9 (H) 08/10/2011 1457   LYMPHSABS 1.9 10/03/2018 1201   LYMPHSABS 2.9 08/10/2011 1457   MONOABS 0.7 10/03/2018 1201   MONOABS 0.6 08/10/2011 1457   EOSABS 0.2 10/03/2018 1201   EOSABS 0.1 08/10/2011 1457   BASOSABS 0.1 10/03/2018 1201   BASOSABS 0.0 08/10/2011 1457    BMET    Component Value Date/Time   NA 140 10/11/2018 0415   NA 139 08/10/2011 1457   K 3.7 10/11/2018 0415   K 4.4 08/10/2011 1457   CL 108 10/11/2018 0415   CL 98 08/10/2011 1457   CO2 25 10/11/2018 0415   CO2 29 08/10/2011 1457   GLUCOSE 125 (H) 10/11/2018 0415   GLUCOSE 98 08/10/2011 1457   BUN 8 10/11/2018 0415   BUN 15 08/10/2011 1457   CREATININE 0.82 10/11/2018 0415   CREATININE 0.9 08/10/2011 1457   CALCIUM 8.5 (L) 10/11/2018 0415   CALCIUM 8.9 08/10/2011 1457   GFRNONAA >60 10/11/2018 0415   GFRAA >60 10/11/2018 0415     Intake/Output Summary (Last 24 hours) at 10/11/2018 0716 Last data filed at 10/11/2018 6546 Gross per 24 hour  Intake 1936.4 ml  Output 2550 ml  Net -613.6 ml     Assessment/Plan:  This is a 84 y.o. female who  is s/p left CEA 1 Day Post-Op  -pt is doing well this am. -pt neuro exam is in tact -pt has not ambulated-needs to walk in halls -pt has voided -f/u with Dr. Carlis Abbott in 2 weeks. -will discharge home after she has had breakfast and walked in the hallways.   Leontine Locket, PA-C [facility] [phone]  I have seen and evaluated the patient. I agree with the PA note as documented above. Doing well POD#1 s/p left CEA.  Neuro intact.  Neck looks great.  D/C today on Plavix for at least one month post-op.  Marty Heck, MD [facility] [place] Office: [phone] Pager: [phone]

## 2018-10-11 NOTE — Discharge Summary (Signed)
Discharge Summary     Lisa Vega November 03, 1932 84 y.o. female  [id number]  Admission Date: 10/10/2018  Discharge Date: 10/11/18  Physician: Marty Heck, MD  Admission Diagnosis: left carotid stenosis   HPI:   This is a 84 y.o. female with history of rheumatoid arthritis, COPD, history of small cell lung cancer status post resection with chemo in 2005 (no evidence of disease at this time) that presents for evaluation of high grade left carotid stenosis.  Patient apparently initially had her carotids evaluated by Dr. Fletcher Anon earlier this year when she had a moderate stenosis.  Most recently she has had ongoing headaches as well as some vision changes and some additional evaluation was done including CTA of her neck.  CTA neck showed progression to high-grade left ICA stenosis.  On evaluation today patient states she has been having some increased vision loss that has been intermittent.  At times she describes it as blurriness in both eyes and other times a shade coming down over her left eye.  She does have a history of macular degeneration.  Patient lives at home with her daughter and is independent and remains functional.  Her only other complaint is pulsatile headache.  Hospital Course:  The patient was admitted to the hospital and taken to the operating room on 10/10/2018 and underwent left carotid endarterectomy.    Findings: 1.  Continuous Doppler audible flow signatures are appropriate for each carotid artery. 2.  No evidence of intimal flap visualized on transverse or longitudinal ultrasonography. 3.  Two tandem high grade carotid plaques in the left proximal ICA.  The pt tolerated the procedure well and was transported to the PACU in good condition.   By POD 1, the pt neuro status was in tact.  She was discharged on Plavix.  The remainder of the hospital course consisted of increasing mobilization and increasing intake of solids without difficulty.   Recent  Labs    10/11/18 0415  NA 140  K 3.7  CL 108  CO2 25  GLUCOSE 125*  BUN 8  CALCIUM 8.5*   Recent Labs    10/10/18 1640 10/11/18 0415  WBC 6.2 8.3  HGB 11.5* 10.4*  HCT 35.8* 32.0*  PLT 187 196   No results for input(s): INR in the last 72 hours.   Discharge Instructions    Discharge patient   Complete by:  As directed    D/c home after pt has walked in the hallways and eaten breakfast.  thx   Discharge disposition:  01-Home or Self Care   Discharge patient date:  10/11/2018      Discharge Diagnosis:  left carotid stenosis  Secondary Diagnosis: Patient Active Problem List   Diagnosis Date Noted  . Carotid stenosis 10/10/2018  . Carotid stenosis, left 09/19/2018  . BPV (benign positional vertigo) 03/10/2018  . History of lung cancer 11/29/2017  . Difficulty sleeping 09/22/2017  . Diabetes ([place]) 05/13/2017  . Fatigue 05/12/2017  . CAP (community acquired pneumonia) 05/03/2017  . Orthopnea 01/12/2017  . GERD (gastroesophageal reflux disease) 11/09/2016  . Osteoporosis 05/06/2016  . Depression with anxiety 05/06/2016  . Hiatal hernia 05/06/2016  . Hypothyroidism 05/06/2016  . Tremor 10/28/2015  . Memory difficulties 10/28/2015  . Hoarseness 10/14/2015  . History of vitamin D deficiency 05/02/2015  . Other constipation 01/29/2015  . Abdominal pain, chronic, epigastric 11/01/2014  . 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 ([place]) 05/30/2014  . Diverticulitis of colon without hemorrhage 08/07/2013  . Prolapse of  female pelvic organs 06/22/2013  . Neck pain 03/10/2013  . Vaginal atrophy 03/09/2013  . Rectocele 03/09/2013  . Cystocele 03/09/2013  . Female pelvic pain 03/09/2013  . Urinary incontinence 03/09/2013  . COPD (chronic obstructive pulmonary disease) ([place]) 01/16/2011  . CARCINOMA, LUNG, NONSMALL CELL 12/17/2010  . Rheumatoid arthritis ([place]) 12/17/2010  . Dyspnea on exertion 12/17/2010   Past Medical History:  Diagnosis Date  . Anxiety     . Arthritis    RA  . BPV (benign positional vertigo) 03/10/2018  . Cancer ([place])    lung ca  . Carotid stenosis, left 09/19/2018  . COPD (chronic obstructive pulmonary disease) ([place])   . Depression   . Dyspnea    with exertion  . GERD (gastroesophageal reflux disease)   . Hypercholesteremia   . Hypothyroidism   . Memory difficulties 10/28/2015  . Pneumonia     x3  last time 2017  . Pre-diabetes   . Rheumatoid arthritis(714.0)   . Thyroid disease   . Tremor 10/28/2015   Jaw tremor  . Uterine prolapse   . Vitamin D deficiency     Allergies as of 10/11/2018      Reactions   Codeine    REACTION: hallucinations   Demerol    Meperidine Hcl    REACTION: severe GI upset   Meperidine Hcl       Medication List    TAKE these medications   acetaminophen 325 MG tablet Commonly known as:  TYLENOL Take 650 mg by mouth every 6 (six) hours as needed for mild pain.   ALPRAZolam 0.25 MG tablet Commonly known as:  XANAX Take 1 tablet (0.25 mg total) by mouth daily as needed. for anxiety What changed:    reasons to take this  additional instructions   blood glucose meter kit and supplies Kit Dispense based on patient and insurance preference. Use up to four times daily as directed. (FOR E11.9).   budesonide-formoterol 160-4.5 MCG/ACT inhaler Commonly known as:  SYMBICORT 2 puffs BID What changed:    how much to take  how to take this  when to take this  additional instructions   chlordiazePOXIDE 5 MG capsule Commonly known as:  LIBRIUM Take 5 mg by mouth 2 (two) times daily as needed (nervous stomach).   clopidogrel 75 MG tablet Commonly known as:  PLAVIX Take 1 tablet (75 mg total) by mouth daily.   DULoxetine 30 MG capsule Commonly known as:  CYMBALTA Take 1 capsule (30 mg total) by mouth daily.   folic acid 1 MG tablet Commonly known as:  FOLVITE Take 1 mg by mouth daily.   gabapentin 100 MG capsule Commonly known as:  NEURONTIN Take 2 capsules (200  mg total) by mouth 3 (three) times daily. What changed:  how much to take   glucose blood test strip Use to test blood sugars up to 4 times daily.   ibuprofen 200 MG tablet Commonly known as:  ADVIL,MOTRIN Take 200 mg by mouth every 6 (six) hours as needed for headache or mild pain.   LINZESS 72 MCG capsule Generic drug:  linaclotide Take 72 mcg by mouth daily as needed (constipation).   meclizine 25 MG tablet Commonly known as:  ANTIVERT Take 0.5-1 tablets (12.5-25 mg total) by mouth 3 (three) times daily as needed for dizziness.   methotrexate 50 MG/2ML injection Inject 17.5 mg into the skin every Sunday. Patient is not taking it Sunday, October 13- patient reported   NONFORMULARY OR COMPOUNDED ITEM Estradiol .  02% 1 ML Prefilled Applicator Sig: apply vaginally twice a week #90 Day Supply with 4 refills   omeprazole 40 MG capsule Commonly known as:  PRILOSEC Take 40 mg by mouth daily.   ONETOUCH DELICA LANCETS 94T Misc USE TO CHECK SUGARS UP TO 4 TIMES A DAY   PROAIR HFA 108 (90 Base) MCG/ACT inhaler Generic drug:  albuterol USE 1-2 PUFFS EVERY 4-6 HOURS AS NEEDED What changed:  See the new instructions.   ranitidine 150 MG tablet Commonly known as:  ZANTAC TAKE 1 TABLET BY MOUTH EVERY EVENING AT BEDTIME AS NEEDED FOR HEARTBURN What changed:    how much to take  how to take this  when to take this  reasons to take this  additional instructions   rosuvastatin 5 MG tablet Commonly known as:  CRESTOR Take 1 tablet (5 mg total) by mouth daily.   sertraline 100 MG tablet Commonly known as:  ZOLOFT TAKE 1 TABLET BY MOUTH EVERY DAY What changed:  how much to take   SYNTHROID 88 MCG tablet Generic drug:  levothyroxine TAKE 1 TABLET DAILY What changed:  how much to take   Tiotropium Bromide Monohydrate 2.5 MCG/ACT Aers Inhale 2 puffs into the lungs daily. What changed:    how much to take  when to take this  reasons to take this   traMADol 50 MG  tablet Commonly known as:  ULTRAM Take 50 mg by mouth 3 (three) times daily as needed for moderate pain.   triamcinolone cream 0.1 % Commonly known as:  KENALOG Apply 1 application topically daily as needed. Thin layer on vulva as needed What changed:    reasons to take this  additional instructions        [facility] Discharge Instructions Carotid Endarterectomy (CEA)  Please refer to the following instructions for your post-procedure care. Your surgeon or physician assistant will discuss any changes with you.  Activity  You are encouraged to walk as much as you can. You can slowly return to normal activities but must avoid strenuous activity and heavy lifting until your doctor tell you it's OK. Avoid activities such as vacuuming or swinging a golf club. You can drive after one week if you are comfortable and you are no longer taking prescription pain medications. It is normal to feel tired for serval weeks after your surgery. It is also normal to have difficulty with sleep habits, eating, and bowel movements after surgery. These will go away with time.  Bathing/Showering  You may shower after you come home. Do not soak in a bathtub, hot tub, or swim until the incision heals completely.  Incision Care  Shower every day. Clean your incision with mild soap and water. Pat the area dry with a clean towel. You do not need a bandage unless otherwise instructed. Do not apply any ointments or creams to your incision. You may have skin glue on your incision. Do not peel it off. It will come off on its own in about one week. Your incision may feel thickened and raised for several weeks after your surgery. This is normal and the skin will soften over time. For Men Only: It's OK to shave around the incision but do not shave the incision itself for 2 weeks. It is common to have numbness under your chin that could last for several months.  Diet  Resume your  normal diet. There are no special food restrictions following this procedure. A low fat/low cholesterol  diet is recommended for all patients with vascular disease. In order to heal from your surgery, it is CRITICAL to get adequate nutrition. Your body requires vitamins, minerals, and protein. Vegetables are the best source of vitamins and minerals. Vegetables also provide the perfect balance of protein. Processed food has little nutritional value, so try to avoid this.  Medications  Resume taking all of your medications unless your doctor or physician assistant tells you not to.  If your incision is causing pain, you may take over-the- counter pain relievers such as acetaminophen (Tylenol). If you were prescribed a stronger pain medication, please be aware these medications can cause nausea and constipation.  Prevent nausea by taking the medication with a snack or meal. Avoid constipation by drinking plenty of fluids and eating foods with a high amount of fiber, such as fruits, vegetables, and grains.  Do not take Tylenol if you are taking prescription pain medications.  Follow Up  Our office will schedule a follow up appointment 2-3 weeks following discharge.  Please call us immediately for any of the following conditions  . Increased pain, redness, drainage (pus) from your incision site. . Fever of 101 degrees or higher. . If you should develop stroke (slurred speech, difficulty swallowing, weakness on one side of your body, loss of vision) you should call 911 and go to the nearest emergency room. .  Reduce your risk of vascular disease:  . Stop smoking. If you would like help call [facility] at 1-800-QUIT-NOW 828-581-3181) or [place] at 587 345 8178. . Manage your cholesterol . Maintain a desired weight . Control your diabetes . Keep your blood pressure down .  If you have any questions, please call the office at [phone].  Prescriptions given: none  Disposition:  home  Patient's condition: is Good  Follow up: 1. Dr. Carlis Abbott in 2 weeks.   Leontine Locket, PA-C [facility] [phone]   --- For [facility] use ---   Modified Rankin score at D/C (0-6): 0  IV medication needed for:  1. Hypertension: No 2. Hypotension: No  Post-op Complications: No  1. Post-op CVA or TIA: No  If yes: Event classification (right eye, left eye, right cortical, left cortical, verterobasilar, other): n/a  If yes: Timing of event (intra-op, <6 hrs post-op, >=6 hrs post-op, unknown): n/a  2. CN injury: No  If yes: CN n/a injuried   3. Myocardial infarction: No  If yes: Dx by (EKG or clinical, Troponin): n/a  4.  CHF: No  5.  Dysrhythmia (new): No  6. Wound infection: No  7. Reperfusion symptoms: No  8. Return to OR: No  If yes: return to OR for (bleeding, neurologic, other CEA incision, other): n/a  Discharge medications: Statin use:  Yes ASA use:  No   Beta blocker use:  No ACE-Inhibitor use:  No  ARB use:  No CCB use: No P2Y12 Antagonist use: Yes, [ x] Plavix, '[ ]'  Plasugrel, '[ ]'  Ticlopinine, '[ ]'  Ticagrelor, '[ ]'  Other, '[ ]'  No for medical reason, '[ ]'  Non-compliant, '[ ]'  Not-indicated Anti-coagulant use:  No, '[ ]'  Warfarin, '[ ]'  Rivaroxaban, '[ ]'  Dabigatran,

## 2018-10-11 NOTE — Progress Notes (Signed)
10/11/2018 10:57 AM Discharge AVS meds take and those due reviewed with pt. Follow up appointments and when to call MD reviewed. All questions and concerns addressed. No further questions at this time. D/c IV and TELE, CCMD notified. D/C home per orders. Brought down via wheelchair with volunteer and family.  Amanda Cockayne, RN

## 2018-10-12 ENCOUNTER — Telehealth: Payer: Self-pay | Admitting: Vascular Surgery

## 2018-10-12 ENCOUNTER — Telehealth: Payer: Self-pay

## 2018-10-12 ENCOUNTER — Ambulatory Visit: Payer: Medicare Other | Admitting: Women's Health

## 2018-10-12 ENCOUNTER — Other Ambulatory Visit: Payer: Self-pay | Admitting: Neurology

## 2018-10-12 NOTE — Telephone Encounter (Signed)
sch appt spk to pt 10/25/18 2pm ABI 3pm p/o MD

## 2018-10-12 NOTE — Telephone Encounter (Signed)
Returned call to patient. She had taken her BP this morning with her BP machine and it was 146/94. It was taken only once in one arm. Her daughter is a Marine scientist and she is going to take her BP manually and keep a record of it. Patient had a little headache today but took tylenol and it has gone. No dizziness or blurred vision. Will keep record and call back if BP remains elevated.

## 2018-10-12 NOTE — Telephone Encounter (Signed)
-----   Message from Gabriel Earing, [place] sent at 10/11/2018  7:22 AM EDT ----- S/p left CEA 10/10/18.  F/u with Dr. Carlis Abbott in 2-3 weeks.  Thanks

## 2018-10-18 ENCOUNTER — Other Ambulatory Visit: Payer: Self-pay

## 2018-10-18 DIAGNOSIS — I739 Peripheral vascular disease, unspecified: Secondary | ICD-10-CM

## 2018-10-24 ENCOUNTER — Other Ambulatory Visit: Payer: Self-pay

## 2018-10-24 DIAGNOSIS — I6522 Occlusion and stenosis of left carotid artery: Secondary | ICD-10-CM

## 2018-10-25 ENCOUNTER — Ambulatory Visit (HOSPITAL_COMMUNITY)
Admission: RE | Admit: 2018-10-25 | Discharge: 2018-10-25 | Disposition: A | Discharge status: Home / Self Care | Payer: Medicare Other | Source: Ambulatory Visit | Attending: Vascular Surgery | Admitting: Vascular Surgery

## 2018-10-25 ENCOUNTER — Ambulatory Visit (INDEPENDENT_AMBULATORY_CARE_PROVIDER_SITE_OTHER): Payer: Medicare Other | Admitting: Vascular Surgery

## 2018-10-25 ENCOUNTER — Other Ambulatory Visit: Payer: Self-pay

## 2018-10-25 ENCOUNTER — Encounter: Payer: Self-pay | Admitting: Vascular Surgery

## 2018-10-25 VITALS — BP 167/83 | HR 63 | Temp 97.6°F | Resp 14 | Ht 63.0 in | Wt 126.0 lb

## 2018-10-25 DIAGNOSIS — I6523 Occlusion and stenosis of bilateral carotid arteries: Secondary | ICD-10-CM

## 2018-10-25 DIAGNOSIS — I6522 Occlusion and stenosis of left carotid artery: Secondary | ICD-10-CM | POA: Insufficient documentation

## 2018-10-25 NOTE — Progress Notes (Addendum)
  Postoperative Visit   History of Present Illness   Lisa Vega is a 84 y.o. female who presents for postoperative follow-up for: left CEA by Dr. Clark  (Date: 10/10/18).  The patient's neck incision is healing well.  The patient denies stroke or TIA symptoms including slurring speech, changes in vision, or one sided weakness.  She is taking a statin and plavix daily.  Current Outpatient Medications  Medication Sig Dispense Refill  . acetaminophen (TYLENOL) 325 MG tablet Take 650 mg by mouth every 6 (six) hours as needed for mild pain.     . ALPRAZolam (XANAX) 0.25 MG tablet Take 1 tablet (0.25 mg total) by mouth daily as needed. for anxiety (Patient taking differently: Take 0.25 mg by mouth daily as needed for anxiety. ) 20 tablet 1  . blood glucose meter kit and supplies KIT Dispense based on patient and insurance preference. Use up to four times daily as directed. (FOR E11.9). 1 each 0  . budesonide-formoterol (SYMBICORT) 160-4.5 MCG/ACT inhaler 2 puffs BID (Patient taking differently: Inhale 2 puffs into the lungs 2 (two) times daily. ) 3 Inhaler 3  . chlordiazePOXIDE (LIBRIUM) 5 MG capsule Take 5 mg by mouth 2 (two) times daily as needed (nervous stomach).    . clopidogrel (PLAVIX) 75 MG tablet Take 1 tablet (75 mg total) by mouth daily. 30 tablet 5  . DULoxetine (CYMBALTA) 30 MG capsule TAKE 1 CAPSULE BY MOUTH EVERY DAY 90 capsule 2  . folic acid (FOLVITE) 1 MG tablet Take 1 mg by mouth daily.    . gabapentin (NEURONTIN) 100 MG capsule Take 2 capsules (200 mg total) by mouth 3 (three) times daily. (Patient taking differently: Take 100 mg by mouth 3 (three) times daily. ) 540 capsule 1  . glucose blood (ONE TOUCH ULTRA TEST) test strip Use to test blood sugars up to 4 times daily. 400 each 1  . ibuprofen (ADVIL,MOTRIN) 200 MG tablet Take 200 mg by mouth every 6 (six) hours as needed for headache or mild pain.    . linaclotide (LINZESS) 72 MCG capsule Take 72 mcg by mouth daily as  needed (constipation).     . meclizine (ANTIVERT) 25 MG tablet Take 0.5-1 tablets (12.5-25 mg total) by mouth 3 (three) times daily as needed for dizziness. 30 tablet 5  . methotrexate 50 MG/2ML injection Inject 17.5 mg into the skin every Sunday. Patient is not taking it Sunday, October 13- patient reported  1  . NONFORMULARY OR COMPOUNDED ITEM Estradiol .02% 1 ML Prefilled Applicator Sig: apply vaginally twice a week #90 Day Supply with 4 refills 1 each 0  . omeprazole (PRILOSEC) 40 MG capsule Take 40 mg by mouth daily.    . ONETOUCH DELICA LANCETS 33G MISC USE TO CHECK SUGARS UP TO 4 TIMES A DAY  0  . PROAIR HFA 108 (90 Base) MCG/ACT inhaler USE 1-2 PUFFS EVERY 4-6 HOURS AS NEEDED (Patient taking differently: Inhale 2 puffs into the lungs every 6 (six) hours as needed for wheezing. ) 8.5 g 1  . ranitidine (ZANTAC) 150 MG tablet TAKE 1 TABLET BY MOUTH EVERY EVENING AT BEDTIME AS NEEDED FOR HEARTBURN (Patient taking differently: Take 150 mg by mouth at bedtime as needed for heartburn. ) 90 tablet 3  . rosuvastatin (CRESTOR) 5 MG tablet Take 1 tablet (5 mg total) by mouth daily. 90 tablet 3  . sertraline (ZOLOFT) 100 MG tablet TAKE 1 TABLET BY MOUTH EVERY DAY (Patient taking differently: Take 50 mg   by mouth daily. ) 90 tablet 1  . SYNTHROID 88 MCG tablet TAKE 1 TABLET DAILY (Patient taking differently: Take 88 mcg by mouth daily. ) 90 tablet 1  . Tiotropium Bromide Monohydrate (SPIRIVA RESPIMAT) 2.5 MCG/ACT AERS Inhale 2 puffs into the lungs daily. (Patient taking differently: Inhale 1 puff into the lungs daily as needed (wheezing). ) 1 Inhaler 0  . traMADol (ULTRAM) 50 MG tablet Take 50 mg by mouth 3 (three) times daily as needed for moderate pain.   0  . triamcinolone cream (KENALOG) 0.1 % Apply 1 application topically daily as needed. Thin layer on vulva as needed (Patient taking differently: Apply 1 application topically daily as needed (vulva). ) 30 g 3   No current facility-administered  medications for this visit.     For VQI Use Only   PRE-ADM LIVING: Home  AMB STATUS: Ambulatory   Physical Examination   Vitals:   10/25/18 1456 10/25/18 1459  BP: (!) 163/78 (!) 167/83  Pulse: 63 63  Resp: 14   Temp: 97.6 F (36.4 C)   TempSrc: Oral   SpO2: 96%   Weight: 126 lb (57.2 kg)   Height: 5' 3" (1.6 m)     left Neck: Incision is healing well without hematoma or dehiscence  Neuro: CN 2-12 are grossly intact, Motor strength is 5/5 bilaterally, sensation is grossly intact   Medical Decision Making   Lisa Vega is a 84 y.o. female who presents s/p left CEA.   The patient's neck incision is healing well  Neuro exam remains at baseline compared to before surgery  She will continue plavix for 1 month then can switch to 81mg aspirin only  Continue statin  She will follow up in 6 months for repeat of carotid duplex    PA-C [facility] Office: [phone]  I have seen and evaluated the patient. I agree with the PA note as documented above. Doing well after L CEA.  Duplex today and left carotid widely patent after endarterectomy.  No new TIA or stroke symptoms.  Will have her continue plavix for one month post-op then back to aspirin.  Will see her back in 6 months for ongoing surveillance.   Christopher J. Clark, MD [facility] Office: [phone] Pager: [phone]  

## 2018-11-02 DIAGNOSIS — Z902 Acquired absence of lung [part of]: Secondary | ICD-10-CM | POA: Diagnosis not present

## 2018-11-02 DIAGNOSIS — L718 Other rosacea: Secondary | ICD-10-CM | POA: Diagnosis not present

## 2018-11-02 DIAGNOSIS — H02403 Unspecified ptosis of bilateral eyelids: Secondary | ICD-10-CM | POA: Diagnosis not present

## 2018-11-02 DIAGNOSIS — Z961 Presence of intraocular lens: Secondary | ICD-10-CM | POA: Diagnosis not present

## 2018-11-02 DIAGNOSIS — H1851 Endothelial corneal dystrophy: Secondary | ICD-10-CM | POA: Diagnosis not present

## 2018-11-02 DIAGNOSIS — H26493 Other secondary cataract, bilateral: Secondary | ICD-10-CM | POA: Diagnosis not present

## 2018-11-16 DIAGNOSIS — M150 Primary generalized (osteo)arthritis: Secondary | ICD-10-CM | POA: Diagnosis not present

## 2018-11-16 DIAGNOSIS — R634 Abnormal weight loss: Secondary | ICD-10-CM | POA: Diagnosis not present

## 2018-11-16 DIAGNOSIS — M0579 Rheumatoid arthritis with rheumatoid factor of multiple sites without organ or systems involvement: Secondary | ICD-10-CM | POA: Diagnosis not present

## 2018-11-16 DIAGNOSIS — Z79899 Other long term (current) drug therapy: Secondary | ICD-10-CM | POA: Diagnosis not present

## 2018-11-16 DIAGNOSIS — R5382 Chronic fatigue, unspecified: Secondary | ICD-10-CM | POA: Diagnosis not present

## 2018-11-16 DIAGNOSIS — M2550 Pain in unspecified joint: Secondary | ICD-10-CM | POA: Diagnosis not present

## 2018-11-16 DIAGNOSIS — Z6822 Body mass index (BMI) 22.0-22.9, adult: Secondary | ICD-10-CM | POA: Diagnosis not present

## 2018-11-22 ENCOUNTER — Telehealth: Payer: Self-pay

## 2018-11-22 NOTE — Telephone Encounter (Signed)
Pt. Called c/o bruising under chin and around neck and stiffness. She stated she bruises very easily. Explained to her that this is likely from the surgery and positioning and it should resolve on its own and to call us back if it does not or symptoms worsen. Pt. was unsure of when to stop Plavix. Per her office visit note by Dr. Carlis Abbott, I explained to her that she is to remain on Plavix until 11/29 and then start taking only 81 mg of Aspirin. Pt. Also c/o pressure/headache that is behind her eye at times. She stated she did have these prior to her surgery and they have "subsided a little" and "are better but not gone". I explained to her that after CEA headaches can be normal and should resolve. I asked her if she checks her b/p and explained how high b/p can cause h/a's and that she should see her PCP if this is the case. She verbalized understanding.

## 2018-11-26 ENCOUNTER — Other Ambulatory Visit: Payer: Self-pay | Admitting: Internal Medicine

## 2018-11-29 ENCOUNTER — Ambulatory Visit: Payer: Medicare Other | Admitting: Cardiovascular Disease

## 2018-11-29 DIAGNOSIS — R0989 Other specified symptoms and signs involving the circulatory and respiratory systems: Secondary | ICD-10-CM

## 2019-01-05 ENCOUNTER — Ambulatory Visit: Payer: Medicare Other | Admitting: Gynecology

## 2019-01-12 ENCOUNTER — Ambulatory Visit: Payer: Medicare Other | Admitting: Gynecology

## 2019-01-17 ENCOUNTER — Ambulatory Visit (INDEPENDENT_AMBULATORY_CARE_PROVIDER_SITE_OTHER): Payer: Medicare Other | Admitting: Gynecology

## 2019-01-17 ENCOUNTER — Encounter: Payer: Self-pay | Admitting: Gynecology

## 2019-01-17 VITALS — BP 124/82

## 2019-01-17 DIAGNOSIS — N3000 Acute cystitis without hematuria: Secondary | ICD-10-CM | POA: Diagnosis not present

## 2019-01-17 DIAGNOSIS — B373 Candidiasis of vulva and vagina: Secondary | ICD-10-CM

## 2019-01-17 DIAGNOSIS — Z4689 Encounter for fitting and adjustment of other specified devices: Secondary | ICD-10-CM | POA: Diagnosis not present

## 2019-01-17 DIAGNOSIS — N8189 Other female genital prolapse: Secondary | ICD-10-CM | POA: Diagnosis not present

## 2019-01-17 DIAGNOSIS — B3731 Acute candidiasis of vulva and vagina: Secondary | ICD-10-CM

## 2019-01-17 LAB — WET PREP FOR TRICH, YEAST, CLUE

## 2019-01-17 MED ORDER — MUPIROCIN 2 % EX OINT: 1.0000 "application " | TOPICAL_OINTMENT | Freq: Two times a day (BID) | CUTANEOUS | 1 refills | Status: AC

## 2019-01-17 MED ORDER — NITROFURANTOIN MONOHYD MACRO 100 MG PO CAPS: 100.0000 mg | ORAL_CAPSULE | Freq: Two times a day (BID) | ORAL | 0 refills | Status: DC

## 2019-01-17 MED ORDER — FLUCONAZOLE 150 MG PO TABS: 150.0000 mg | ORAL_TABLET | Freq: Once | ORAL | 0 refills | Status: AC

## 2019-01-17 NOTE — Patient Instructions (Signed)
Take the Diflucan pill now and at the end of your Macrobid antibiotics.  Use the remaining pills as needed for recurrent vaginitis.  Take the Macrodantin antibiotic twice daily for 7 days to treat the urinary tract infection.  Follow-up if your symptoms persist, worsen or recur.

## 2019-01-17 NOTE — Addendum Note (Signed)
Addended by: Nelva Nay on: 01/17/2019 04:02 PM   Modules accepted: Orders

## 2019-01-17 NOTE — Progress Notes (Signed)
    Lisa Vega 1932/03/01 [id number]        85 y.o.  [id number] presents for pessary check.  Also has a vaginal discharge with itching.  Recently has been on antibiotics.  Notes some mild dysuria.  No frequency urgency low back pain.  Past medical history,surgical history, problem list, medications, allergies, family history and social history were all reviewed and documented in the EPIC chart.  Directed ROS with pertinent positives and negatives documented in the history of present illness/assessment and plan.  Exam: Lisa Vega assistant Vitals:   01/17/19 1459  BP: 124/82   General appearance:  Normal Abdomen soft nontender without masses guarding rebound Pelvic external BUS vagina with atrophic changes.  White discharge noted.  Ring pessary with support removed cleansed and replaced without difficulty.  Vaginal mucosa without evidence of significant irritation or erosion.  Cervix atrophic.  Uterus grossly normal midline mobile nontender.  Adnexa without masses or tenderness.  Assessment/Plan:  85 y.o. [name] with pelvic relaxation.  Doing well with her ring pessary.  Wet prep is positive for yeast.  Urine analysis consistent with UTI.  Will treat with Macrobid 100 mg twice daily x7 days which she has used recently and done well with.  Also Diflucan 150 mg 1 tab now and 1 tab at the end of her Macrobid.  I gave her a total of 5 to have available in the case that she has a recurrence of her vaginitis.  Patient will follow-up in 3 months for pessary check, sooner if any issues.    Lisa Auerbach MD, 3:12 PM 01/17/2019

## 2019-01-19 ENCOUNTER — Ambulatory Visit: Payer: Medicare Other | Admitting: Neurology

## 2019-01-19 LAB — URINALYSIS, COMPLETE W/RFL CULTURE
Bilirubin Urine: NEGATIVE
Glucose, UA: NEGATIVE
Hyaline Cast: NONE SEEN /LPF
Ketones, ur: NEGATIVE
Nitrites, Initial: NEGATIVE
Protein, ur: NEGATIVE
Specific Gravity, Urine: 1.015 (ref 1.001–1.03)
pH: 5.5 (ref 5.0–8.0)

## 2019-01-19 LAB — URINE CULTURE
MICRO NUMBER:: 88530
SPECIMEN QUALITY:: ADEQUATE

## 2019-01-19 LAB — CULTURE INDICATED

## 2019-01-23 ENCOUNTER — Ambulatory Visit (INDEPENDENT_AMBULATORY_CARE_PROVIDER_SITE_OTHER)
Admission: RE | Admit: 2019-01-23 | Discharge: 2019-01-23 | Disposition: A | Discharge status: Home / Self Care | Payer: Medicare Other | Source: Ambulatory Visit | Attending: Nurse Practitioner | Admitting: Nurse Practitioner

## 2019-01-23 ENCOUNTER — Telehealth: Payer: Self-pay | Admitting: Neurology

## 2019-01-23 ENCOUNTER — Ambulatory Visit: Payer: Medicare Other | Admitting: Neurology

## 2019-01-23 ENCOUNTER — Ambulatory Visit (INDEPENDENT_AMBULATORY_CARE_PROVIDER_SITE_OTHER): Payer: Medicare Other | Admitting: Nurse Practitioner

## 2019-01-23 ENCOUNTER — Encounter: Payer: Self-pay | Admitting: Nurse Practitioner

## 2019-01-23 VITALS — BP 110/70 | HR 74 | Ht 63.0 in | Wt 121.0 lb

## 2019-01-23 DIAGNOSIS — J101 Influenza due to other identified influenza virus with other respiratory manifestations: Secondary | ICD-10-CM | POA: Diagnosis not present

## 2019-01-23 DIAGNOSIS — R6889 Other general symptoms and signs: Secondary | ICD-10-CM

## 2019-01-23 DIAGNOSIS — J069 Acute upper respiratory infection, unspecified: Secondary | ICD-10-CM

## 2019-01-23 LAB — RESPIRATORY VIRUS PANEL
Influenza A RNA: NOT DETECTED
Influenza B RNA: NOT DETECTED
RSV RNA: NOT DETECTED
hMPV: NOT DETECTED

## 2019-01-23 MED ORDER — DOXYCYCLINE HYCLATE 100 MG PO TABS: 100.0000 mg | ORAL_TABLET | Freq: Two times a day (BID) | ORAL | 0 refills | Status: DC

## 2019-01-23 NOTE — Progress Notes (Signed)
Reviewed, agree 

## 2019-01-23 NOTE — Progress Notes (Signed)
'@Patient'  ID: Lisa Vega, female    DOB: 03-12-1932, 85 y.o.   MRN: [id number]  Chief Complaint  Patient presents with  . Acute Visit    Throwing up and dizzy,Fever,SOB    Referring provider: Binnie Rail, MD  HPI  85 year old female former smoker with COPD in addition to history of lung cancer status post right lower lobectomy in 2005 who is followed by Dr. Lake Bells. Patient does have a history of recurrent pneumonia.  She has rheumatoid arthritis and has been on methotrexate since 1986.  Tests:  Chest imaging: CXR 12/2011:  No acute process. August 2018 CT chest: Right upper lobe pneumonia and bronchiectasis noted, faint nodule suggestive of hypersensitivity pneumonitis versus RBI LD, other nodules present status post right lower lobectomy December 2018 high-resolution CT scan of the chest: No findings to suggest interstitial lung disease. Status post right middle and lower lobectomy. There continues to be clustered micro and macronodularity in the dependent portion of the hyperexpanded right upper lobe which is very similar to the recent prior examination from 08/19/2017, again favored to represent chronic post infectious or inflammatory scarring. Mild dilatation of the pulmonic trunk (3.5 cm in diameter), concerning for pulmonary arterial hypertension.  Other imaging: 07/2017 SLP eval: no aspiration 09/2017 Esophogram: IMPRESSION:, No laryngeal penetration, likely chronic reflux, mild presbyesophagus, no stricture a small reducible hiatal hernia no obstruction to liquid or barium tablet   2013 echocardiogram LVEF 50-55%, normal valvular dysfunction, abnormal septal motion consistent with bundle branch block  PFT: PFT Results Latest Ref Rng & Units 08/19/2018 01/24/2018  FVC-Pre L 1.99 2.02  FVC-Predicted Pre % 85 86  FVC-Post L 2.06 2.15  FVC-Predicted Post % 88 91  Pre FEV1/FVC % % 61 60  Post FEV1/FCV % % 62 63  FEV1-Pre L 1.21 1.20  FEV1-Predicted Pre % 70 69    FEV1-Post L 1.28 1.36  DLCO UNC% % 48 50  DLCO COR %Predicted % 72 70  TLC L 4.49 4.51  TLC % Predicted % 91 92  RV % Predicted % 103 101    OV 01/23/19 - ACUTE Sinus and chest congestion Patient presents today with head and chest congestion.  She states that her symptoms started last Wednesday.  She complains of right ear pain and sore throat.  She reports sinus pressure and pain.  She states that she has had a low-grade fever over the past couple of days.  She states that her cough is productive of yellow sputum.  She is compliant with Symbicort and Spiriva.  She denies any shortness of breath, chest pain, or edema.    Allergies  Allergen Reactions  . Codeine     REACTION: hallucinations  . Demerol   . Meperidine Hcl     REACTION: severe GI upset  . Meperidine Hcl     Immunization History  Administered Date(s) Administered  . Influenza Split 09/28/2011, 09/28/2015  . Influenza Whole 09/27/2012  . Influenza, High Dose Seasonal PF 10/28/2016, 01/12/2017, 09/22/2017, 10/03/2018  . Influenza,inj,Quad PF,6+ Mos 09/20/2013, 09/12/2014  . Pneumococcal Conjugate-13 10/24/2015  . Pneumococcal Polysaccharide-23 12/29/2007    Past Medical History:  Diagnosis Date  . Anxiety   . Arthritis    RA  . BPV (benign positional vertigo) 03/10/2018  . Cancer ([place])    lung ca  . Carotid stenosis, left 09/19/2018  . COPD (chronic obstructive pulmonary disease) ([place])   . Depression   . Dyspnea    with exertion  . GERD (gastroesophageal reflux  disease)   . Hypercholesteremia   . Hypothyroidism   . Memory difficulties 10/28/2015  . Pneumonia     x3  last time 2017  . Pre-diabetes   . Rheumatoid arthritis(714.0)   . Thyroid disease   . Tremor 10/28/2015   Jaw tremor  . Uterine prolapse   . Vitamin D deficiency     Tobacco History: Social History   Tobacco Use  Smoking Status Former Smoker  . Packs/day: 1.00  . Years: 20.00  . Pack years: 20.00  . Types: Cigarettes  . Last  attempt to quit: 11/10/2004  . Years since quitting: 14.2  Smokeless Tobacco Never Used   Counseling given: Not Answered   Outpatient Encounter Medications as of 01/23/2019  Medication Sig  . acetaminophen (TYLENOL) 325 MG tablet Take 650 mg by mouth every 6 (six) hours as needed for mild pain.   Marland Kitchen ALPRAZolam (XANAX) 0.25 MG tablet Take 1 tablet (0.25 mg total) by mouth daily as needed. for anxiety (Patient taking differently: Take 0.25 mg by mouth daily as needed for anxiety. )  . blood glucose meter kit and supplies KIT Dispense based on patient and insurance preference. Use up to four times daily as directed. (FOR E11.9).  . budesonide-formoterol (SYMBICORT) 160-4.5 MCG/ACT inhaler 2 puffs BID (Patient taking differently: Inhale 2 puffs into the lungs 2 (two) times daily. )  . DULoxetine (CYMBALTA) 30 MG capsule TAKE 1 CAPSULE BY MOUTH EVERY DAY  . folic acid (FOLVITE) 1 MG tablet Take 1 mg by mouth daily.  Marland Kitchen gabapentin (NEURONTIN) 100 MG capsule Take 2 capsules (200 mg total) by mouth 3 (three) times daily. (Patient taking differently: Take 100 mg by mouth 3 (three) times daily. )  . glucose blood (ONE TOUCH ULTRA TEST) test strip Use to test blood sugars up to 4 times daily.  Marland Kitchen ibuprofen (ADVIL,MOTRIN) 200 MG tablet Take 200 mg by mouth every 6 (six) hours as needed for headache or mild pain.  Marland Kitchen linaclotide (LINZESS) 72 MCG capsule Take 72 mcg by mouth daily as needed (constipation).   . meclizine (ANTIVERT) 25 MG tablet Take 0.5-1 tablets (12.5-25 mg total) by mouth 3 (three) times daily as needed for dizziness.  . methotrexate 50 MG/2ML injection Inject 17.5 mg into the skin every Sunday. Patient is not taking it Sunday, October 13- patient reported  . mupirocin ointment (BACTROBAN) 2 % Place 1 application into the nose 2 (two) times daily.  . nitrofurantoin, macrocrystal-monohydrate, (MACROBID) 100 MG capsule Take 1 capsule (100 mg total) by mouth 2 (two) times daily.  . NONFORMULARY OR  COMPOUNDED ITEM Estradiol .02% 1 ML Prefilled Applicator Sig: apply vaginally twice a week #90 Day Supply with 4 refills  . omeprazole (PRILOSEC) 40 MG capsule Take 40 mg by mouth daily.  [name] DELICA LANCETS 13K MISC USE TO CHECK SUGARS UP TO 4 TIMES A DAY  . PROAIR HFA 108 (90 Base) MCG/ACT inhaler USE 1-2 PUFFS EVERY 4-6 HOURS AS NEEDED (Patient taking differently: Inhale 2 puffs into the lungs every 6 (six) hours as needed for wheezing. )  . ranitidine (ZANTAC) 150 MG tablet TAKE 1 TABLET BY MOUTH EVERY EVENING AT BEDTIME AS NEEDED FOR HEARTBURN (Patient taking differently: Take 150 mg by mouth at bedtime as needed for heartburn. )  . sertraline (ZOLOFT) 100 MG tablet TAKE 1 TABLET BY MOUTH EVERY DAY (Patient taking differently: Take 50 mg by mouth daily. )  . SYNTHROID 88 MCG tablet TAKE 1 TABLET DAILY (Patient taking  differently: Take 88 mcg by mouth daily. )  . SYNTHROID 88 MCG tablet TAKE 1 TABLET DAILY  . Tiotropium Bromide Monohydrate (SPIRIVA RESPIMAT) 2.5 MCG/ACT AERS Inhale 2 puffs into the lungs daily. (Patient taking differently: Inhale 1 puff into the lungs daily as needed (wheezing). )  . traMADol (ULTRAM) 50 MG tablet Take 50 mg by mouth 3 (three) times daily as needed for moderate pain.   Marland Kitchen triamcinolone cream (KENALOG) 0.1 % Apply 1 application topically daily as needed. Thin layer on vulva as needed (Patient taking differently: Apply 1 application topically daily as needed (vulva). )  . doxycycline (VIBRA-TABS) 100 MG tablet Take 1 tablet (100 mg total) by mouth 2 (two) times daily.  . rosuvastatin (CRESTOR) 5 MG tablet Take 1 tablet (5 mg total) by mouth daily.   No facility-administered encounter medications on file as of 01/23/2019.      Review of Systems  Review of Systems  Constitutional: Positive for chills and fever. Negative for appetite change.  HENT: Positive for congestion, postnasal drip, sinus pressure, sinus pain and sore throat.   Respiratory:  Positive for cough and shortness of breath.   Cardiovascular: Negative.  Negative for chest pain, palpitations and leg swelling.  Gastrointestinal: Negative.   Allergic/Immunologic: Negative.   Neurological: Negative.   Psychiatric/Behavioral: Negative.        Physical Exam  BP 110/70 (BP Location: Left Arm, Patient Position: Sitting, Cuff Size: Normal)   Pulse 74   Ht '5\' 3"'  (1.6 m)   Wt 121 lb (54.9 kg)   SpO2 92%   BMI 21.43 kg/m   Wt Readings from Last 5 Encounters:  01/23/19 121 lb (54.9 kg)  10/25/18 126 lb (57.2 kg)  10/10/18 128 lb (58.1 kg)  10/06/18 128 lb 11.2 oz (58.4 kg)  10/04/18 128 lb (58.1 kg)     Physical Exam Vitals signs and nursing note reviewed.  Constitutional:      General: She is not in acute distress.    Appearance: She is well-developed.  HENT:     Right Ear: Hearing, tympanic membrane, ear canal and external ear normal.     Left Ear: Hearing, tympanic membrane, ear canal and external ear normal.     Nose:     Right Sinus: Maxillary sinus tenderness and frontal sinus tenderness present.     Left Sinus: Maxillary sinus tenderness and frontal sinus tenderness present.     Mouth/Throat:     Pharynx: Posterior oropharyngeal erythema present. No oropharyngeal exudate.  Cardiovascular:     Rate and Rhythm: Normal rate and regular rhythm.  Pulmonary:     Effort: Pulmonary effort is normal. No respiratory distress.     Breath sounds: Normal breath sounds. No wheezing or rhonchi.  Neurological:     Mental Status: She is alert and oriented to person, place, and time.      Imaging: Dg Chest 2 View  Result Date: 01/23/2019 CLINICAL DATA:  Flu-like symptoms. History of COPD. Previous right lobectomy. EXAM: CHEST - 2 VIEW COMPARISON:  08/23/2018 FINDINGS: The heart size and pulmonary vascularity are normal. No infiltrates or effusions. Chronic elevation of the right hemidiaphragm with surgical clips at the inferior aspect of the right hilum.  Previous resection of a portion of the posterior aspect of the right seventh rib. Chronic mild thoracolumbar scoliosis.  No acute bone abnormality. Slight chronic apical pleural thickening on the right. IMPRESSION: No active cardiopulmonary disease. Electronically Signed   By: Lorriane Shire M.D.   On:  01/23/2019 12:54     Assessment & Plan:   Upper respiratory tract infection Will check viral panel and check chest x ray considering recent fever. Will check chest x ray and call with results.  Patient Instructions  Will order stat chest x ray Will order Viral respiratory panel Will call with results and will order antibiotic if indicated May take mucinex twice daily Continue symbicort Continue spiriva Continue albuterol as needed General instructions: Hydrate  Drink enough water to keep your pee (urine) clear or pale yellow. Rest  Rest as much as possible.  Sleep with your head raised (elevated).  Make sure to get enough sleep each night. Other instructions  Put a warm, moist washcloth on your face 3-4 times a day or as told by your doctor. This will help with discomfort.  Wash your hands often with soap and water. If there is no soap and water, use hand sanitizer.  Do not smoke. Avoid being around people who are smoking (secondhand smoke). Call the office if:  You have a fever.  Your symptoms get worse.  Your symptoms do not get better within 10 days.  Follow up in 1 week or sooner if needed       Fenton Foy, NP 01/23/2019

## 2019-01-23 NOTE — Telephone Encounter (Signed)
This patient canceled the same day of a revisit appointment. 

## 2019-01-23 NOTE — Patient Instructions (Addendum)
Will order stat chest x ray Will order Viral respiratory panel Will call with results and will order antibiotic if indicated May take mucinex twice daily Continue symbicort Continue spiriva Continue albuterol as needed General instructions: Hydrate  Drink enough water to keep your pee (urine) clear or pale yellow. Rest  Rest as much as possible.  Sleep with your head raised (elevated).  Make sure to get enough sleep each night. Other instructions  Put a warm, moist washcloth on your face 3-4 times a day or as told by your doctor. This will help with discomfort.  Wash your hands often with soap and water. If there is no soap and water, use hand sanitizer.  Do not smoke. Avoid being around people who are smoking (secondhand smoke). Call the office if:  You have a fever.  Your symptoms get worse.  Your symptoms do not get better within 10 days.  Follow up in 1 week or sooner if needed

## 2019-01-23 NOTE — Assessment & Plan Note (Signed)
Will check viral panel and check chest x ray considering recent fever. Will check chest x ray and call with results.  Patient Instructions  Will order stat chest x ray Will order Viral respiratory panel Will call with results and will order antibiotic if indicated May take mucinex twice daily Continue symbicort Continue spiriva Continue albuterol as needed General instructions: Hydrate  Drink enough water to keep your pee (urine) clear or pale yellow. Rest  Rest as much as possible.  Sleep with your head raised (elevated).  Make sure to get enough sleep each night. Other instructions  Put a warm, moist washcloth on your face 3-4 times a day or as told by your doctor. This will help with discomfort.  Wash your hands often with soap and water. If there is no soap and water, use hand sanitizer.  Do not smoke. Avoid being around people who are smoking (secondhand smoke). Call the office if:  You have a fever.  Your symptoms get worse.  Your symptoms do not get better within 10 days.  Follow up in 1 week or sooner if needed

## 2019-01-24 ENCOUNTER — Encounter: Payer: Self-pay | Admitting: Neurology

## 2019-01-30 ENCOUNTER — Ambulatory Visit (INDEPENDENT_AMBULATORY_CARE_PROVIDER_SITE_OTHER): Payer: Medicare Other | Admitting: Nurse Practitioner

## 2019-01-30 ENCOUNTER — Encounter: Payer: Self-pay | Admitting: Nurse Practitioner

## 2019-01-30 DIAGNOSIS — J069 Acute upper respiratory infection, unspecified: Secondary | ICD-10-CM

## 2019-01-30 NOTE — Patient Instructions (Addendum)
Glad to see you are feeling better today Please complete entire course of doxycycline Continue symbicort Continue spiriva Continue albuterol as needed  Keep upcoming follow up as scheduled with Dr. Lake Bells

## 2019-01-30 NOTE — Assessment & Plan Note (Signed)
Patient is much improved today.  Patient Instructions  Glad to see you are feeling better today Please complete entire course of doxycycline Continue symbicort Continue spiriva Continue albuterol as needed  Keep upcoming follow up as scheduled with Dr. Lake Bells

## 2019-01-30 NOTE — Progress Notes (Signed)
'@Patient'  ID: [name], female    DOB: 1932-10-25, 85 y.o.   MRN: [id number]  Chief Complaint  Patient presents with  . Follow-up    1 week f/u. States she was starting to feel better but symptoms returned on Wednesday-Thursday. Denies any fever. Cough is non-productive.     Referring provider: Binnie Rail, MD  HPI  85 year old female former smoker with COPD in addition to history of lung cancer status post right lower lobectomy in 2005 who is followed by Dr. Lake Bells. Patient does have a history of recurrent pneumonia.  She has rheumatoid arthritis and has been on methotrexate since 1986.  Tests:  Chest imaging: CXR 12/2011: No acute process. August 2018 CT chest: Right upper lobe pneumonia and bronchiectasis noted, faint nodule suggestive of hypersensitivity pneumonitis versus RBI LD, other nodules present status post right lower lobectomy December 2018 high-resolution CT scan of the chest: No findings to suggest interstitial lung disease. Status post right middle and lower lobectomy. There continues to be clustered micro and macronodularity in the dependent portion of the hyperexpanded right upper lobe which is very similar to the recent prior examination from 08/19/2017, again favored to represent chronic post infectious or inflammatory scarring. Mild dilatation of the pulmonic trunk (3.5 cm in diameter), concerning for pulmonary arterial hypertension.  Other imaging: 07/2017 SLP eval: no aspiration 09/2017 Esophogram: IMPRESSION:, No laryngeal penetration, likely chronic reflux, mild presbyesophagus, no stricture a small reducible hiatal hernia no obstruction to liquid or barium tablet   2013 echocardiogram LVEF 50-55%, normal valvular dysfunction, abnormal septal motion consistent with bundle branch block  PFT: PFT Results Latest Ref Rng & Units 08/19/2018 01/24/2018  FVC-Pre L 1.99 2.02  FVC-Predicted Pre % 85 86  FVC-Post L 2.06 2.15  FVC-Predicted Post % 88 91   Pre FEV1/FVC % % 61 60  Post FEV1/FCV % % 62 63  FEV1-Pre L 1.21 1.20  FEV1-Predicted Pre % 70 69  FEV1-Post L 1.28 1.36  DLCO UNC% % 48 50  DLCO COR %Predicted % 72 70  TLC L 4.49 4.51  TLC % Predicted % 91 92  RV % Predicted % 103 101   OV 01/30/19 - Follow up Patient presents for follow-up today.  He was last seen by me on 01/23/2019.  She was given doxycycline.  She is still taking the doxycycline and she states that she does feel much better.  She states that her right ear pain and sore throat have cleared up now.  Has any sinus pressure or pain today.  He is compliant with Symbicort and Spiriva.  Denies any recent fevers.  Denies any shortness of breath, chest pain, or edema.  Allergies  Allergen Reactions  . Codeine     REACTION: hallucinations  . Demerol   . Meperidine Hcl     REACTION: severe GI upset  . Meperidine Hcl     Immunization History  Administered Date(s) Administered  . Influenza Split 09/28/2011, 09/28/2015  . Influenza Whole 09/27/2012  . Influenza, High Dose Seasonal PF 10/28/2016, 01/12/2017, 09/22/2017, 10/03/2018  . Influenza,inj,Quad PF,6+ Mos 09/20/2013, 09/12/2014  . Pneumococcal Conjugate-13 10/24/2015  . Pneumococcal Polysaccharide-23 12/29/2007    Past Medical History:  Diagnosis Date  . Anxiety   . Arthritis    RA  . BPV (benign positional vertigo) 03/10/2018  . Cancer ([place])    lung ca  . Carotid stenosis, left 09/19/2018  . COPD (chronic obstructive pulmonary disease) ([place])   . Depression   . Dyspnea  with exertion  . GERD (gastroesophageal reflux disease)   . Hypercholesteremia   . Hypothyroidism   . Memory difficulties 10/28/2015  . Pneumonia     x3  last time 2017  . Pre-diabetes   . Rheumatoid arthritis(714.0)   . Thyroid disease   . Tremor 10/28/2015   Jaw tremor  . Uterine prolapse   . Vitamin D deficiency     Tobacco History: Social History   Tobacco Use  Smoking Status Former Smoker  . Packs/day: 1.00  .  Years: 20.00  . Pack years: 20.00  . Types: Cigarettes  . Last attempt to quit: 11/10/2004  . Years since quitting: 14.2  Smokeless Tobacco Never Used   Counseling given: Not Answered   Outpatient Encounter Medications as of 01/30/2019  Medication Sig  . acetaminophen (TYLENOL) 325 MG tablet Take 650 mg by mouth every 6 (six) hours as needed for mild pain.   Marland Kitchen ALPRAZolam (XANAX) 0.25 MG tablet Take 1 tablet (0.25 mg total) by mouth daily as needed. for anxiety (Patient taking differently: Take 0.25 mg by mouth daily as needed for anxiety. )  . blood glucose meter kit and supplies KIT Dispense based on patient and insurance preference. Use up to four times daily as directed. (FOR E11.9).  . budesonide-formoterol (SYMBICORT) 160-4.5 MCG/ACT inhaler 2 puffs BID (Patient taking differently: Inhale 2 puffs into the lungs 2 (two) times daily. )  . DULoxetine (CYMBALTA) 30 MG capsule TAKE 1 CAPSULE BY MOUTH EVERY DAY  . folic acid (FOLVITE) 1 MG tablet Take 1 mg by mouth daily.  Marland Kitchen gabapentin (NEURONTIN) 100 MG capsule Take 2 capsules (200 mg total) by mouth 3 (three) times daily. (Patient taking differently: Take 100 mg by mouth 3 (three) times daily. )  . ibuprofen (ADVIL,MOTRIN) 200 MG tablet Take 200 mg by mouth every 6 (six) hours as needed for headache or mild pain.  Marland Kitchen linaclotide (LINZESS) 72 MCG capsule Take 72 mcg by mouth daily as needed (constipation).   . meclizine (ANTIVERT) 25 MG tablet Take 0.5-1 tablets (12.5-25 mg total) by mouth 3 (three) times daily as needed for dizziness.  . methotrexate 50 MG/2ML injection Inject 17.5 mg into the skin every Sunday. Patient is not taking it Sunday, October 13- patient reported  . mupirocin ointment (BACTROBAN) 2 % Place 1 application into the nose 2 (two) times daily.  . nitrofurantoin, macrocrystal-monohydrate, (MACROBID) 100 MG capsule Take 1 capsule (100 mg total) by mouth 2 (two) times daily.  . NONFORMULARY OR COMPOUNDED ITEM Estradiol  .02% 1 ML Prefilled Applicator Sig: apply vaginally twice a week #90 Day Supply with 4 refills  . omeprazole (PRILOSEC) 40 MG capsule Take 40 mg by mouth daily.  [name] DELICA LANCETS 94W MISC USE TO CHECK SUGARS UP TO 4 TIMES A DAY  . PROAIR HFA 108 (90 Base) MCG/ACT inhaler USE 1-2 PUFFS EVERY 4-6 HOURS AS NEEDED (Patient taking differently: Inhale 2 puffs into the lungs every 6 (six) hours as needed for wheezing. )  . ranitidine (ZANTAC) 150 MG tablet TAKE 1 TABLET BY MOUTH EVERY EVENING AT BEDTIME AS NEEDED FOR HEARTBURN (Patient taking differently: Take 150 mg by mouth at bedtime as needed for heartburn. )  . sertraline (ZOLOFT) 100 MG tablet TAKE 1 TABLET BY MOUTH EVERY DAY (Patient taking differently: Take 50 mg by mouth daily. )  . SYNTHROID 88 MCG tablet TAKE 1 TABLET DAILY (Patient taking differently: Take 88 mcg by mouth daily. )  . Tiotropium Bromide Monohydrate (  SPIRIVA RESPIMAT) 2.5 MCG/ACT AERS Inhale 2 puffs into the lungs daily. (Patient taking differently: Inhale 1 puff into the lungs daily as needed (wheezing). )  . traMADol (ULTRAM) 50 MG tablet Take 50 mg by mouth 3 (three) times daily as needed for moderate pain.   Marland Kitchen triamcinolone cream (KENALOG) 0.1 % Apply 1 application topically daily as needed. Thin layer on vulva as needed (Patient taking differently: Apply 1 application topically daily as needed (vulva). )  . [DISCONTINUED] doxycycline (VIBRA-TABS) 100 MG tablet Take 1 tablet (100 mg total) by mouth 2 (two) times daily.  . [DISCONTINUED] glucose blood (ONE TOUCH ULTRA TEST) test strip Use to test blood sugars up to 4 times daily.  . [DISCONTINUED] rosuvastatin (CRESTOR) 5 MG tablet Take 1 tablet (5 mg total) by mouth daily.  . [DISCONTINUED] SYNTHROID 88 MCG tablet TAKE 1 TABLET DAILY   No facility-administered encounter medications on file as of 01/30/2019.      Review of Systems  Review of Systems  Constitutional: Negative.  Negative for chills and fever.   HENT: Negative.  Negative for sinus pressure, sinus pain and sore throat.   Respiratory: Positive for cough. Negative for shortness of breath and wheezing.   Cardiovascular: Negative.  Negative for chest pain, palpitations and leg swelling.  Gastrointestinal: Negative.   Allergic/Immunologic: Negative.   Neurological: Negative.   Psychiatric/Behavioral: Negative.        Physical Exam  BP 120/70 (BP Location: Left Arm, Patient Position: Sitting, Cuff Size: Normal)   Pulse 66   Ht '5\' 3"'  (1.6 m)   Wt 122 lb (55.3 kg)   SpO2 97%   BMI 21.61 kg/m   Wt Readings from Last 5 Encounters:  01/30/19 122 lb (55.3 kg)  01/23/19 121 lb (54.9 kg)  10/25/18 126 lb (57.2 kg)  10/10/18 128 lb (58.1 kg)  10/06/18 128 lb 11.2 oz (58.4 kg)     Physical Exam Vitals signs and nursing note reviewed.  Constitutional:      General: She is not in acute distress.    Appearance: She is well-developed.  Cardiovascular:     Rate and Rhythm: Normal rate and regular rhythm.  Pulmonary:     Effort: Pulmonary effort is normal. No respiratory distress.     Breath sounds: Normal breath sounds. No wheezing, rhonchi or rales.  Neurological:     Mental Status: She is alert and oriented to person, place, and time.       Imaging: Dg Chest 2 View  Result Date: 01/23/2019 CLINICAL DATA:  Flu-like symptoms. History of COPD. Previous right lobectomy. EXAM: CHEST - 2 VIEW COMPARISON:  08/23/2018 FINDINGS: The heart size and pulmonary vascularity are normal. No infiltrates or effusions. Chronic elevation of the right hemidiaphragm with surgical clips at the inferior aspect of the right hilum. Previous resection of a portion of the posterior aspect of the right seventh rib. Chronic mild thoracolumbar scoliosis.  No acute bone abnormality. Slight chronic apical pleural thickening on the right. IMPRESSION: No active cardiopulmonary disease. Electronically Signed   By: Lorriane Shire M.D.   On: 01/23/2019 12:54      Assessment & Plan:   Upper respiratory tract infection Patient is much improved today.  Patient Instructions  Glad to see you are feeling better today Please complete entire course of doxycycline Continue symbicort Continue spiriva Continue albuterol as needed  Keep upcoming follow up as scheduled with Dr. Tawanna Solo, NP 01/30/2019

## 2019-01-31 NOTE — Progress Notes (Signed)
Reviewed, agree 

## 2019-02-16 ENCOUNTER — Ambulatory Visit: Payer: Medicare Other | Admitting: Neurology

## 2019-02-20 DIAGNOSIS — R5382 Chronic fatigue, unspecified: Secondary | ICD-10-CM | POA: Diagnosis not present

## 2019-02-20 DIAGNOSIS — M0579 Rheumatoid arthritis with rheumatoid factor of multiple sites without organ or systems involvement: Secondary | ICD-10-CM | POA: Diagnosis not present

## 2019-02-20 DIAGNOSIS — R634 Abnormal weight loss: Secondary | ICD-10-CM | POA: Diagnosis not present

## 2019-02-20 DIAGNOSIS — M2550 Pain in unspecified joint: Secondary | ICD-10-CM | POA: Diagnosis not present

## 2019-02-20 DIAGNOSIS — Z79899 Other long term (current) drug therapy: Secondary | ICD-10-CM | POA: Diagnosis not present

## 2019-02-20 DIAGNOSIS — Z6822 Body mass index (BMI) 22.0-22.9, adult: Secondary | ICD-10-CM | POA: Diagnosis not present

## 2019-02-20 DIAGNOSIS — M150 Primary generalized (osteo)arthritis: Secondary | ICD-10-CM | POA: Diagnosis not present

## 2019-03-01 ENCOUNTER — Encounter: Payer: Self-pay | Admitting: Nurse Practitioner

## 2019-03-01 ENCOUNTER — Ambulatory Visit (INDEPENDENT_AMBULATORY_CARE_PROVIDER_SITE_OTHER): Payer: Medicare Other | Admitting: Nurse Practitioner

## 2019-03-01 DIAGNOSIS — J449 Chronic obstructive pulmonary disease, unspecified: Secondary | ICD-10-CM

## 2019-03-01 MED ORDER — TIOTROPIUM BROMIDE MONOHYDRATE 2.5 MCG/ACT IN AERS: 2.0000 | INHALATION_SPRAY | Freq: Every day | RESPIRATORY_TRACT | 0 refills | Status: DC

## 2019-03-01 NOTE — Progress Notes (Signed)
Reviewed, agree 

## 2019-03-01 NOTE — Assessment & Plan Note (Signed)
Patient presents today for routine follow-up.  She has been doing well since last visit.  She stated that at her last visit with Dr. Lake Bells she used her samples of Spiriva Respimat and it really helped.  She has went back to her Spiriva HandiHaler because her samples ran out and states that it does not help, but unfortunately she is not using it as prescribed.  She only takes it when she feels like she needs it and not daily.  I will give her samples of Spiriva Respimat again today.  Patient's memory seems to be getting worse.  She is going to discuss this with her neurologist next week.  I have asked her to bring her inhalers to her next follow-up visit.  I do not think that she is taking her medications correctly.  Demonstrated for her how to use the Spiriva Respimat in the office today.  Patient Instructions  COPD: Continue Symbicort 2 puffs twice a day Continue Spiriva daily, try using the Respimat form, sample given today - 2 puffs daily Use Proair as needed  Practice good hand hygiene Stay active  Follow up: Follow up with Dr. Lake Bells at his first available in around 1 month - please bring all inhalers to next visit

## 2019-03-01 NOTE — Progress Notes (Signed)
_0  ID: [name], female    DOB: [birth year], 85 y.o.   MRN: [id number]  Chief Complaint  Patient presents with  . Shortness of Breath    Patient stated she is not really that much better since the last visit. Worse when she exerts herself.    Referring provider: Binnie Rail, MD  HPI 85 year old female former smoker with COPD in addition to history of lung cancer status post right lower lobectomy in 2005 who is followed by Dr. Lake Bells. Patient does have a history of recurrent pneumonia.She has rheumatoid arthritis and has been on methotrexate since 1986.  Tests:  Chest imaging: CXR 01/23/19 - No active cardiopulmonary disease. CXR 12/2011: No acute process. August 2018 CT chest: Right upper lobe pneumonia and bronchiectasis noted, faint nodule suggestive of hypersensitivity pneumonitis versus RBI LD, other nodules present status post right lower lobectomy December 2018 high-resolution CT scan of the chest:No findings to suggest interstitial lung disease. Status post right middle and lower lobectomy. There continues to be clustered micro and macronodularity in the dependent portion of the hyperexpanded right upper lobe which is very similar to the recent prior examination from 08/19/2017, again favored to represent chronic post infectious or inflammatory scarring. Mild dilatation of the pulmonic trunk (3.5 cm in diameter), concerning for pulmonary arterial hypertension.  Other imaging: 07/2017 SLP eval: no aspiration 09/2017 Esophogram: IMPRESSION:, No laryngeal penetration, likely chronic reflux, mild presbyesophagus, no stricture a small reducible hiatal hernia no obstruction to liquid or barium tablet   2013 echocardiogram LVEF 50-55%, normal valvular dysfunction, abnormal septal motion consistent with bundle branch block  PFT: PFT Results Latest Ref Rng & Units 08/19/2018 01/24/2018  FVC-Pre L 1.99 2.02  FVC-Predicted Pre % 85 86  FVC-Post L 2.06 2.15    FVC-Predicted Post % 88 91  Pre FEV1/FVC % % 61 60  Post FEV1/FCV % % 62 63  FEV1-Pre L 1.21 1.20  FEV1-Predicted Pre % 70 69  FEV1-Post L 1.28 1.36  DLCO UNC% % 48 50  DLCO COR %Predicted % 72 70  TLC L 4.49 4.51  TLC % Predicted % 91 92  RV % Predicted % 103 101    OV 03/01/19 - follow up visit Patient presents for a follow-up visit today.  She is feeling much better since last visit.  She was last seen by me on 01/30/2019 after being treated for an upper respiratory infection with doxycycline.  She states that she has been doing well since last visit.  She still complains of ongoing shortness of breath at times especially with exertion.  She states that she does have trouble with her memory.  She has not been taking her medications as prescribed.  States that she is compliant with Symbicort but has not been taking Spiriva.  At her last visit with Dr. Lake Bells on 08/19/2018 her Spiriva was switched to Respimat.  States that she did use the Spiriva until her samples ran out states that it did help.  She started back on her Spiriva HandiHaler after her samples ran out.  She states that the HandiHaler does not work as well, but admits that she only takes it occasionally.  She does not take it daily as prescribed.  Denies any recent fever.  She denies any chest pain or edema.  She is scheduled to see neurology next week and will discuss her memory loss with her neurologist.    Allergies  Allergen Reactions  . Codeine     REACTION: hallucinations  .  Demerol   . Meperidine Hcl     REACTION: severe GI upset    Immunization History  Administered Date(s) Administered  . Influenza Split 09/28/2011, 09/28/2015  . Influenza Whole 09/27/2012  . Influenza, High Dose Seasonal PF 10/28/2016, 01/12/2017, 09/22/2017, 10/03/2018  . Influenza,inj,Quad PF,6+ Mos 09/20/2013, 09/12/2014  . Pneumococcal Conjugate-13 10/24/2015  . Pneumococcal Polysaccharide-23 12/29/2007    Past Medical History:   Diagnosis Date  . Anxiety   . Arthritis    RA  . BPV (benign positional vertigo) 03/10/2018  . Cancer ([place])    lung ca  . Carotid stenosis, left 09/19/2018  . COPD (chronic obstructive pulmonary disease) ([place])   . Depression   . Dyspnea    with exertion  . GERD (gastroesophageal reflux disease)   . Hypercholesteremia   . Hypothyroidism   . Memory difficulties 10/28/2015  . Pneumonia     x3  last time 2017  . Pre-diabetes   . Rheumatoid arthritis(714.0)   . Thyroid disease   . Tremor 10/28/2015   Jaw tremor  . Uterine prolapse   . Vitamin D deficiency     Tobacco History: Social History   Tobacco Use  Smoking Status Former Smoker  . Packs/day: 1.00  . Years: 20.00  . Pack years: 20.00  . Types: Cigarettes  . Last attempt to quit: 11/10/2004  . Years since quitting: 14.3  Smokeless Tobacco Never Used   Counseling given: Yes   Outpatient Encounter Medications as of 03/01/2019  Medication Sig  . acetaminophen (TYLENOL) 325 MG tablet Take 650 mg by mouth every 6 (six) hours as needed for mild pain.   Marland Kitchen ALPRAZolam (XANAX) 0.25 MG tablet Take 1 tablet (0.25 mg total) by mouth daily as needed. for anxiety (Patient taking differently: Take 0.25 mg by mouth daily as needed for anxiety. )  . blood glucose meter kit and supplies KIT Dispense based on patient and insurance preference. Use up to four times daily as directed. (FOR E11.9).  . budesonide-formoterol (SYMBICORT) 160-4.5 MCG/ACT inhaler 2 puffs BID (Patient taking differently: Inhale 2 puffs into the lungs 2 (two) times daily. )  . DULoxetine (CYMBALTA) 30 MG capsule TAKE 1 CAPSULE BY MOUTH EVERY DAY  . folic acid (FOLVITE) 1 MG tablet Take 1 mg by mouth daily.  Marland Kitchen gabapentin (NEURONTIN) 100 MG capsule Take 2 capsules (200 mg total) by mouth 3 (three) times daily. (Patient taking differently: Take 100 mg by mouth 3 (three) times daily. )  . ibuprofen (ADVIL,MOTRIN) 200 MG tablet Take 200 mg by mouth every 6 (six) hours  as needed for headache or mild pain.  Marland Kitchen linaclotide (LINZESS) 72 MCG capsule Take 72 mcg by mouth daily as needed (constipation).   . meclizine (ANTIVERT) 25 MG tablet Take 0.5-1 tablets (12.5-25 mg total) by mouth 3 (three) times daily as needed for dizziness.  . methotrexate 50 MG/2ML injection Inject 17.5 mg into the skin every Sunday. Patient is not taking it Sunday, October 13- patient reported  . mupirocin ointment (BACTROBAN) 2 % Place 1 application into the nose 2 (two) times daily.  . nitrofurantoin, macrocrystal-monohydrate, (MACROBID) 100 MG capsule Take 1 capsule (100 mg total) by mouth 2 (two) times daily.  . NONFORMULARY OR COMPOUNDED ITEM Estradiol .02% 1 ML Prefilled Applicator Sig: apply vaginally twice a week #90 Day Supply with 4 refills  . omeprazole (PRILOSEC) 40 MG capsule Take 40 mg by mouth daily.  [name] DELICA LANCETS 72C MISC USE TO CHECK SUGARS UP TO 4  TIMES A DAY  . PROAIR HFA 108 (90 Base) MCG/ACT inhaler USE 1-2 PUFFS EVERY 4-6 HOURS AS NEEDED (Patient taking differently: Inhale 2 puffs into the lungs every 6 (six) hours as needed for wheezing. )  . ranitidine (ZANTAC) 150 MG tablet TAKE 1 TABLET BY MOUTH EVERY EVENING AT BEDTIME AS NEEDED FOR HEARTBURN (Patient taking differently: Take 150 mg by mouth at bedtime as needed for heartburn. )  . sertraline (ZOLOFT) 100 MG tablet TAKE 1 TABLET BY MOUTH EVERY DAY (Patient taking differently: Take 50 mg by mouth daily. )  . SYNTHROID 88 MCG tablet TAKE 1 TABLET DAILY (Patient taking differently: Take 88 mcg by mouth daily. )  . Tiotropium Bromide Monohydrate (SPIRIVA RESPIMAT) 2.5 MCG/ACT AERS Inhale 2 puffs into the lungs daily. (Patient taking differently: Inhale 1 puff into the lungs daily as needed (wheezing). )  . traMADol (ULTRAM) 50 MG tablet Take 50 mg by mouth 3 (three) times daily as needed for moderate pain.   . triamcinolone cream (KENALOG) 0.1 % Apply 1 application topically daily as needed. Thin layer on  vulva as needed (Patient taking differently: Apply 1 application topically daily as needed (vulva). )  . Tiotropium Bromide Monohydrate (SPIRIVA RESPIMAT) 2.5 MCG/ACT AERS Inhale 2 puffs into the lungs daily.   No facility-administered encounter medications on file as of 03/01/2019.      Review of Systems  Review of Systems  Constitutional: Negative.  Negative for chills and fever.  HENT: Negative.   Respiratory: Positive for shortness of breath. Negative for cough and wheezing.   Cardiovascular: Negative.  Negative for chest pain, palpitations and leg swelling.  Gastrointestinal: Negative.   Allergic/Immunologic: Negative.   Neurological: Negative.   Psychiatric/Behavioral: Negative.        Physical Exam  BP 132/68 (BP Location: Left Arm, Patient Position: Sitting, Cuff Size: Normal)   Pulse 87   Temp 97.9 F (36.6 C)   Ht 5' 3" (1.6 m)   Wt 128 lb (58.1 kg)   SpO2 95%   BMI 22.67 kg/m   Wt Readings from Last 5 Encounters:  03/01/19 128 lb (58.1 kg)  01/30/19 122 lb (55.3 kg)  01/23/19 121 lb (54.9 kg)  10/25/18 126 lb (57.2 kg)  10/10/18 128 lb (58.1 kg)     Physical Exam Vitals signs and nursing note reviewed.  Constitutional:      General: She is not in acute distress.    Appearance: She is well-developed.  Cardiovascular:     Rate and Rhythm: Normal rate and regular rhythm.  Pulmonary:     Effort: Pulmonary effort is normal.     Breath sounds: Normal breath sounds. No wheezing or rhonchi.  Neurological:     Mental Status: She is alert and oriented to person, place, and time.        Assessment & Plan:   COPD (chronic obstructive pulmonary disease) ([facility]) Patient presents today for routine follow-up.  She has been doing well since last visit.  She stated that at her last visit with Dr. McQuaid she used her samples of Spiriva Respimat and it really helped.  She has went back to her Spiriva HandiHaler because her samples ran out and states that it does not  help, but unfortunately she is not using it as prescribed.  She only takes it when she feels like she needs it and not daily.  I will give her samples of Spiriva Respimat again today.  Patient's memory seems to be getting worse.    She is going to discuss this with her neurologist next week.  I have asked her to bring her inhalers to her next follow-up visit.  I do not think that she is taking her medications correctly.  Demonstrated for her how to use the Spiriva Respimat in the office today.  Patient Instructions  COPD: Continue Symbicort 2 puffs twice a day Continue Spiriva daily, try using the Respimat form, sample given today - 2 puffs daily Use Proair as needed  Practice good hand hygiene Stay active  Follow up: Follow up with Dr. McQuaid at his first available in around 1 month - please bring all inhalers to next visit        S , NP 03/01/2019  

## 2019-03-01 NOTE — Patient Instructions (Signed)
COPD: Continue Symbicort 2 puffs twice a day Continue Spiriva daily, try using the Respimat form, sample given today - 2 puffs daily Use Proair as needed  Practice good hand hygiene Stay active  Follow up: Follow up with Dr. Lake Bells at his first available in around 1 month - please bring all inhalers to next visit

## 2019-03-09 ENCOUNTER — Ambulatory Visit (INDEPENDENT_AMBULATORY_CARE_PROVIDER_SITE_OTHER): Payer: Medicare Other | Admitting: Neurology

## 2019-03-09 ENCOUNTER — Encounter: Payer: Self-pay | Admitting: Neurology

## 2019-03-09 ENCOUNTER — Other Ambulatory Visit: Payer: Self-pay

## 2019-03-09 VITALS — BP 121/62 | HR 74 | Ht 63.0 in | Wt 126.3 lb

## 2019-03-09 DIAGNOSIS — G4489 Other headache syndrome: Secondary | ICD-10-CM

## 2019-03-09 DIAGNOSIS — M542 Cervicalgia: Secondary | ICD-10-CM

## 2019-03-09 DIAGNOSIS — H8110 Benign paroxysmal vertigo, unspecified ear: Secondary | ICD-10-CM | POA: Diagnosis not present

## 2019-03-09 HISTORY — DX: Other headache syndrome: G44.89

## 2019-03-09 MED ORDER — GABAPENTIN 100 MG PO CAPS: 200.0000 mg | ORAL_CAPSULE | Freq: Three times a day (TID) | ORAL | 1 refills | Status: DC

## 2019-03-09 NOTE — Patient Instructions (Signed)
Gabapentin will be increased to 200 mg three times a day.  Neurontin (gabapentin) may result in drowsiness, ankle swelling, gait instability, or possibly dizziness. Please contact our office if significant side effects occur with this medication.

## 2019-03-09 NOTE — Progress Notes (Signed)
Reason for visit: Cerebrovascular disease, headache, positional vertigo, mild memory disorder  Lisa Vega is an 85 y.o. female  History of present illness:  [name] is an 85 year old right-handed white female with a history of cerebrovascular disease.  The patient had left carotid artery surgery on 10 October 2018 for a high-grade left carotid stenosis.  She has done well following this.  The patient has recently developed positional vertigo again, she will have dizziness when she rolls over to her right side in bed, and she may have brief dizziness when she first gets up out of bed.  The patient feels somewhat unsteady with walking as well, she has been set up for physical therapy for vestibular rehabilitation in the near future.  The patient has significant degenerative arthritis of the left knee, she has shortness of breath following a lobectomy of the lung.  The patient is now having increased problems with headaches, she has a burning sensation on the top of the head on the side of the head or back of the head, she also reports some neck stiffness.  She denies any falls.  She indicates that she is taking low-dose gabapentin 100 mg 3 times daily, she believes that this has helped her headaches and helped her tremors in the past.  Past Medical History:  Diagnosis Date  . Anxiety   . Arthritis    RA  . BPV (benign positional vertigo) 03/10/2018  . Cancer ([place])    lung ca  . Carotid stenosis, left 09/19/2018  . COPD (chronic obstructive pulmonary disease) ([place])   . Depression   . Dyspnea    with exertion  . GERD (gastroesophageal reflux disease)   . Hypercholesteremia   . Hypothyroidism   . Memory difficulties 10/28/2015  . Pneumonia     x3  last time 2017  . Pre-diabetes   . Rheumatoid arthritis(714.0)   . Thyroid disease   . Tremor 10/28/2015   Jaw tremor  . Uterine prolapse   . Vitamin D deficiency     Past Surgical History:  Procedure Laterality Date  .  ANGIOPLASTY Left 10/10/2018   Procedure: ANGIOPLASTY using 1cm x 14cm Xenosure Patch;  Surgeon: Marty Heck, MD;  Location: [facility];  Service: Vascular;  Laterality: Left;  . APPENDECTOMY    . COLONOSCOPY W/ POLYPECTOMY    . ENDARTERECTOMY Left 10/10/2018   Procedure: ENDARTERECTOMY CAROTID;  Surgeon: Marty Heck, MD;  Location: [facility];  Service: Vascular;  Laterality: Left;  . EYE SURGERY Bilateral    cataract  . FOOT SURGERY Bilateral    arthritis  . Hands  Bilateral    Arthritis  . HEMORRHOID SURGERY    . right lobectomy  10/2004  . TONSILLECTOMY AND ADENOIDECTOMY      Family History  Problem Relation Age of Onset  . Emphysema Mother   . Asthma Mother   . Lung cancer Father   . Pancreatic cancer Father   . Bone cancer Father   . Heart disease Brother        x1  . Heart disease Sister        x2  . Stroke Maternal Grandfather     Social history:  reports that she quit smoking about 14 years ago. Her smoking use included cigarettes. She has a 20.00 pack-year smoking history. She has never used smokeless tobacco. She reports current alcohol use of about 1.0 standard drinks of alcohol per week. She reports that she does not use  drugs.    Allergies  Allergen Reactions  . Codeine     REACTION: hallucinations  . Demerol   . Meperidine Hcl     REACTION: severe GI upset    Medications:  Prior to Admission medications   Medication Sig Start Date End Date Taking? Authorizing Provider  acetaminophen (TYLENOL) 325 MG tablet Take 650 mg by mouth every 6 (six) hours as needed for mild pain.    Yes [provider]  ALPRAZolam (XANAX) 0.25 MG tablet Take 1 tablet (0.25 mg total) by mouth daily as needed. for anxiety Patient taking differently: Take 0.25 mg by mouth daily as needed for anxiety.  10/03/18  Yes Burns, [name], MD  blood glucose meter kit and supplies KIT Dispense based on patient and insurance preference. Use up to four times daily as directed.  (FOR E11.9). 03/14/18  Yes Burns, [name], MD  budesonide-formoterol (SYMBICORT) 160-4.5 MCG/ACT inhaler 2 puffs BID Patient taking differently: Inhale 2 puffs into the lungs 2 (two) times daily.  09/16/18  Yes Juanito Doom, MD  DULoxetine (CYMBALTA) 30 MG capsule TAKE 1 CAPSULE BY MOUTH EVERY DAY 10/12/18  Yes Kathrynn Ducking, MD  folic acid (FOLVITE) 1 MG tablet Take 1 mg by mouth daily.   Yes [provider]  gabapentin (NEURONTIN) 100 MG capsule Take 2 capsules (200 mg total) by mouth 3 (three) times daily. Patient taking differently: Take 100 mg by mouth 3 (three) times daily.  03/10/18  Yes Kathrynn Ducking, MD  ibuprofen (ADVIL,MOTRIN) 200 MG tablet Take 200 mg by mouth every 6 (six) hours as needed for headache or mild pain.   Yes [provider]  linaclotide (LINZESS) 72 MCG capsule Take 72 mcg by mouth daily as needed (constipation).    Yes [provider]  meclizine (ANTIVERT) 25 MG tablet Take 0.5-1 tablets (12.5-25 mg total) by mouth 3 (three) times daily as needed for dizziness. 09/19/18  Yes Kathrynn Ducking, MD  methotrexate 50 MG/2ML injection Inject 17.5 mg into the skin every Sunday. Patient is not taking it Sunday, October 13- patient reported 07/21/18  Yes [provider]  mupirocin ointment (BACTROBAN) 2 % Place 1 application into the nose 2 (two) times daily. 01/17/19  Yes Fontaine, Belinda Block, MD  nitrofurantoin, macrocrystal-monohydrate, (MACROBID) 100 MG capsule Take 1 capsule (100 mg total) by mouth 2 (two) times daily. 01/17/19  Yes Fontaine, Belinda Block, MD  NONFORMULARY OR COMPOUNDED ITEM Estradiol .02% 1 ML Prefilled Applicator Sig: apply vaginally twice a week #90 Day Supply with 4 refills 06/17/17  Yes Terrance Mass, MD  omeprazole (PRILOSEC) 40 MG capsule Take 40 mg by mouth daily.   Yes [provider]  [facility] DELICA LANCETS 50I MISC USE TO CHECK SUGARS UP TO 4 TIMES A DAY 03/19/18  Yes [provider]   PROAIR HFA 108 (90 Base) MCG/ACT inhaler USE 1-2 PUFFS EVERY 4-6 HOURS AS NEEDED Patient taking differently: Inhale 2 puffs into the lungs every 6 (six) hours as needed for wheezing.  10/05/16  Yes Burns, [name], MD  ranitidine (ZANTAC) 150 MG tablet TAKE 1 TABLET BY MOUTH EVERY EVENING AT BEDTIME AS NEEDED FOR HEARTBURN Patient taking differently: Take 150 mg by mouth at bedtime as needed for heartburn.  03/14/18  Yes Burns, [name], MD  sertraline (ZOLOFT) 100 MG tablet TAKE 1 TABLET BY MOUTH EVERY DAY Patient taking differently: Take 50 mg by mouth daily.  07/25/18  Yes Binnie Rail, MD  SYNTHROID 88 MCG tablet TAKE 1 TABLET DAILY Patient taking differently: Take 88 mcg by mouth daily.  12/01/17  Yes Burns, [name], MD  Tiotropium Bromide Monohydrate (SPIRIVA RESPIMAT) 2.5 MCG/ACT AERS Inhale 2 puffs into the lungs daily. Patient taking differently: Inhale 1 puff into the lungs daily as needed (wheezing).  08/19/18  Yes Juanito Doom, MD  Tiotropium Bromide Monohydrate (SPIRIVA RESPIMAT) 2.5 MCG/ACT AERS Inhale 2 puffs into the lungs daily. 03/01/19  Yes Fenton Foy, NP  traMADol (ULTRAM) 50 MG tablet Take 50 mg by mouth 3 (three) times daily as needed for moderate pain.  05/07/18  Yes [provider]  triamcinolone cream (KENALOG) 0.1 % Apply 1 application topically daily as needed. Thin layer on vulva as needed Patient taking differently: Apply 1 application topically daily as needed (vulva).  10/03/18  Yes Burns, [name], MD    ROS:  Out of a complete 14 system review of symptoms, the patient complains only of the following symptoms, and all other reviewed systems are negative.  Fatigue, weight loss Light sensitivity Shortness of breath Palpitations of the heart Excessive thirst Constipation Joint swelling, neck pain Dizziness, headache, tremors Depression, anxiety  Blood pressure 121/62, pulse 74, height 5' 3" (1.6 m), weight 126 lb 5 oz (57.3 kg).  Physical Exam   General: The patient is alert and cooperative at the time of the examination.  Skin: No significant peripheral edema is noted.   Neurologic Exam  Mental status: The patient is alert and oriented x 3 at the time of the examination. The patient has apparent normal recent and remote memory, with an apparently normal attention span and concentration ability.   Cranial nerves: Facial symmetry is present. Speech is normal, no aphasia or dysarthria is noted. Extraocular movements are full. Visual fields are full.  Motor: The patient has good strength in all 4 extremities.  Sensory examination: Soft touch sensation is symmetric on the face, arms, and legs.  Coordination: The patient has good finger-nose-finger and heel-to-shin bilaterally.  Gait and station: The patient has a normal gait. Tandem gait is unsteady. Romberg is negative. No drift is seen.  Reflexes: Deep tendon reflexes are symmetric.   Assessment/Plan:  1.  Cerebrovascular disease, status post left carotid endarterectomy  2.  Positional vertigo  3.  Reports of mild memory disturbance  4.  Headache, possibly cervicogenic headache  The patient will be undergoing physical therapy for her positional vertigo in the near future.  The headaches have worsened over time, we will go up on the dose of the gabapentin taking 200 mg 3 times daily, she will call for any dose adjustments in the future.  The patient will follow-up here in about 6 months.  We will need to follow her memory issues over time.  Jill Alexanders MD 03/09/2019 12:00 PM  [facility] [address] [place], [place] [address]  Phone [phone] Fax [phone]

## 2019-03-10 DIAGNOSIS — H26493 Other secondary cataract, bilateral: Secondary | ICD-10-CM | POA: Diagnosis not present

## 2019-03-10 DIAGNOSIS — L718 Other rosacea: Secondary | ICD-10-CM | POA: Diagnosis not present

## 2019-03-10 DIAGNOSIS — H1851 Endothelial corneal dystrophy: Secondary | ICD-10-CM | POA: Diagnosis not present

## 2019-03-10 DIAGNOSIS — H02403 Unspecified ptosis of bilateral eyelids: Secondary | ICD-10-CM | POA: Diagnosis not present

## 2019-03-10 DIAGNOSIS — Z961 Presence of intraocular lens: Secondary | ICD-10-CM | POA: Diagnosis not present

## 2019-03-14 ENCOUNTER — Ambulatory Visit: Payer: Medicare Other | Admitting: Physical Therapy

## 2019-03-28 ENCOUNTER — Ambulatory Visit: Payer: Medicare Other | Admitting: Physical Therapy

## 2019-04-03 ENCOUNTER — Ambulatory Visit: Payer: Medicare Other | Admitting: Internal Medicine

## 2019-04-17 ENCOUNTER — Ambulatory Visit: Payer: Medicare Other | Admitting: Physical Therapy

## 2019-04-18 ENCOUNTER — Encounter: Payer: Self-pay | Admitting: Rehabilitative and Restorative Service Providers"

## 2019-04-18 ENCOUNTER — Ambulatory Visit: Payer: Medicare Other | Admitting: Gynecology

## 2019-04-18 ENCOUNTER — Other Ambulatory Visit: Payer: Self-pay

## 2019-04-18 ENCOUNTER — Ambulatory Visit: Payer: Medicare Other | Attending: Internal Medicine | Admitting: Rehabilitative and Restorative Service Providers"

## 2019-04-18 DIAGNOSIS — H8111 Benign paroxysmal vertigo, right ear: Secondary | ICD-10-CM | POA: Diagnosis not present

## 2019-04-18 DIAGNOSIS — R2689 Other abnormalities of gait and mobility: Secondary | ICD-10-CM

## 2019-04-18 NOTE — Therapy (Signed)
[facility] [address] [place], [place], [address] Phone: [phone]   Fax:  [phone]  Physical Therapy Evaluation  Patient Details  Name: Lisa Vega MRN: [id number] Date of Birth: 1932/06/25 Referring Provider (PT): Arville Care, MD   Encounter Date: 04/18/2019  PT End of Session - 04/18/19 1341    Visit Number  1    Number of Visits  4    Date for PT Re-Evaluation  05/18/19    Authorization Type  medicare and federal [employer]BS/ 10th visit PN note    PT Start Time  1245    PT Stop Time  1322    PT Time Calculation (min)  37 min    Activity Tolerance  Patient tolerated treatment well    Behavior During Therapy  [facility] for tasks assessed/performed       Past Medical History:  Diagnosis Date  . Anxiety   . Arthritis    RA  . BPV (benign positional vertigo) 03/10/2018  . Cancer ([place])    lung ca  . Carotid stenosis, left 09/19/2018  . COPD (chronic obstructive pulmonary disease) ([place])   . Depression   . Dyspnea    with exertion  . GERD (gastroesophageal reflux disease)   . Headache syndrome 03/09/2019  . Hypercholesteremia   . Hypothyroidism   . Memory difficulties 10/28/2015  . Pneumonia     x3  last time 2017  . Pre-diabetes   . Rheumatoid arthritis(714.0)   . Thyroid disease   . Tremor 10/28/2015   Jaw tremor  . Uterine prolapse   . Vitamin D deficiency     Past Surgical History:  Procedure Laterality Date  . ANGIOPLASTY Left 10/10/2018   Procedure: ANGIOPLASTY using 1cm x 14cm Xenosure Patch;  Surgeon: Marty Heck, MD;  Location: [facility];  Service: Vascular;  Laterality: Left;  . APPENDECTOMY    . COLONOSCOPY W/ POLYPECTOMY    . ENDARTERECTOMY Left 10/10/2018   Procedure: ENDARTERECTOMY CAROTID;  Surgeon: Marty Heck, MD;  Location: [facility];  Service: Vascular;  Laterality: Left;  . EYE SURGERY Bilateral    cataract  . FOOT SURGERY Bilateral    arthritis  . Hands  Bilateral     Arthritis  . HEMORRHOID SURGERY    . right lobectomy  10/2004  . TONSILLECTOMY AND ADENOIDECTOMY      There were no vitals filed for this visit.   Subjective Assessment - 04/18/19 1248    Subjective  The patient had initial onset of dizziness approximately 5 years ago that responded to treatment for positional vertigo (at Dr. Janeice Robinson office).  She had a return of positional vertigo in 2019.    She notes symptoms worse when lying down on her right side.    Pertinent History  L carotid artery surgery 10/11/2019, rheumatoid arthritis (tolerated epley's in the past without neck issues)    Patient Stated Goals  Get relief from vertigo    Currently in Pain?  Yes    Pain Score  --   shoulders hurt at rest, hands and feet are sore   Pain Location  Knee   neck and hands hurt from RA   Pain Descriptors / Indicators  Aching    Pain Type  Chronic pain         [facility] PT Assessment - 04/18/19 1253      Assessment   Medical Diagnosis  vertigo    Referring Provider (PT)  Arville Care, MD  Onset Date/Surgical Date  --   2019   Hand Dominance  Right    Prior Therapy  has tolerated treatment with Epley's in the past per report      Precautions   Precautions  Other (comment)    Precaution Comments  has RA, she reports 4th vertebrate has an issue (do not see imaging in epic)      Restrictions   Weight Bearing Restrictions  No      Balance Screen   Has the patient fallen in the past 6 months  No    Has the patient had a decrease in activity level because of a fear of falling?   No    Is the patient reluctant to leave their home because of a fear of falling?   No      Home Film/video editor residence    [place]   lives with daughter   Type of [place] to enter    Entrance Stairs-Number of Steps  2    [place]  One level    [place]  None      Prior Function   Level of Independence  Independent       Cognition   Overall Cognitive Status  Within Functional Limits for tasks assessed      Ambulation/Gait   Ambulation/Gait  Yes    Ambulation/Gait Assistance  6: Modified independent (Device/Increase time)    Ambulation Distance (Feet)  50 Feet    Assistive device  None    Gait Pattern  Antalgic;Decreased step length - right   L knee pain hinders ambulation pace   Ambulation Surface  Level    Gait velocity  2.0 ft/sec           Vestibular Assessment - 04/18/19 1256      Vestibular Assessment   General Observation  Patient ambulates into clinic without a device independently.        Symptom Behavior   Subjective history of current problem  Dizziness with lying down to the right side.    Type of Dizziness   Spinning    Frequency of Dizziness  daily    Duration of Dizziness  seconds    Symptom Nature  Motion provoked    Aggravating Factors  Rolling to right    Relieving Factors  Head stationary    Progression of Symptoms  Better    History of similar episodes  1 prior episode, responded to canolith repositioning      Oculomotor Exam   Oculomotor Alignment  Normal    Ocular ROM  WFLs    Spontaneous  Absent    Gaze-induced   Absent    Smooth Pursuits  Intact    Saccades  Intact      Vestibulo-Ocular Reflex   VOR 1 Head Only (x 1 viewing)  The patient is unable to maintain fixation while her head is in motion.  Hard to differentiate ability to follow instruction versus vestibular dysfunction.      Positional Testing   Sidelying Test  Sidelying Right;Sidelying Left    Horizontal Canal Testing  Horizontal Canal Right;Horizontal Canal Left      Sidelying Right   Sidelying Right Duration  15 second    Sidelying Right Symptoms  Upbeat, right rotatory nystagmus      Sidelying Left   Sidelying Left Duration  "strange" feeling in top of her head  Sidelying Left Symptoms  No nystagmus      Horizontal Canal Right   Horizontal Canal Right Duration  mild sensation x  seconds    Horizontal Canal Right Symptoms  Normal      Horizontal Canal Left   Horizontal Canal Left Duration  mild sensation with no nystagmus    Horizontal Canal Left Symptoms  Normal          Objective measurements completed on examination: See above findings.       Vestibular Treatment/Exercise - 04/18/19 1307      Vestibular Treatment/Exercise   Vestibular Treatment Provided  Canalith Repositioning    Canalith Repositioning  Epley Manuever Right       EPLEY MANUEVER RIGHT   Number of Reps   2    Overall Response  Symptoms Resolved    Response Details   MODIFIED:  Due to Rheumatoid arthritis, used a pillow and provided support to the neck.  PT ensured extension stopped at 30 degrees (the minimum needed to perform repositioning).              PT Education - 04/18/19 1341    Education Details  nature of BPPV    Person(s) Educated  Patient    Methods  Explanation    Comprehension  Verbalized understanding          PT Long Term Goals - 04/18/19 1342      PT LONG TERM GOAL #1   Title  The patient will have negative positional testing indicating resolution of BPPV    Time  4    Period  Weeks    Target Date  05/18/19      PT LONG TERM GOAL #2   Title  The patient will subjectively report ability to perform bed mobility without vertigo or dizziness.    Time  4    Period  Weeks    Target Date  05/18/19      PT LONG TERM GOAL #3   Title  The patient will return demo HEP for self mgmt of BPPV as appropriate.    Time  4    Period  Weeks    Target Date  05/18/19             Plan - 04/18/19 1343    Clinical Impression Statement  The patient is an 85 yo female presenting to OP physical therapy due to continued vertigo limiting functional mobility.  She has positive testing for R posterior canalithiasis and tolerated a modified epley's maneuver with no nystagmus viewed on retest.    Personal Factors and Comorbidities  Age;Comorbidity 2     Comorbidities  rheumatoid arthritis, h/o BPPV, COPD    Examination-Activity Limitations  Bed Mobility    Stability/Clinical Decision Making  Stable/Uncomplicated    Clinical Decision Making  Low    Rehab Potential  Good    PT Frequency  1x / week    PT Duration  4 weeks    PT Treatment/Interventions  ADLs/Self Care Home Management;Therapeutic activities;Therapeutic exercise;Neuromuscular re-education;Patient/family education;Gait training;Canalith Repostioning;Vestibular    PT Next Visit Plan  Recheck BPPV, provide information for self mgmt due to intermittent, chronic nature.    Consulted and Agree with Plan of Care  Patient       Patient will benefit from skilled therapeutic intervention in order to improve the following deficits and impairments:  Abnormal gait, Dizziness, Decreased balance  Visit Diagnosis: BPPV (benign paroxysmal positional vertigo), right     Problem List  Patient Active Problem List   Diagnosis Date Noted  . Headache syndrome 03/09/2019  . Upper respiratory tract infection 01/23/2019  . Carotid stenosis 10/10/2018  . Carotid stenosis, left 09/19/2018  . Benign positional vertigo 03/10/2018  . History of lung cancer 11/29/2017  . Difficulty sleeping 09/22/2017  . Diabetes ([place]) 05/13/2017  . Fatigue 05/12/2017  . CAP (community acquired pneumonia) 05/03/2017  . Orthopnea 01/12/2017  . GERD (gastroesophageal reflux disease) 11/09/2016  . Osteoporosis 05/06/2016  . Depression with anxiety 05/06/2016  . Hiatal hernia 05/06/2016  . Hypothyroidism 05/06/2016  . Tremor 10/28/2015  . Memory difficulties 10/28/2015  . Hoarseness 10/14/2015  . History of vitamin D deficiency 05/02/2015  . Other constipation 01/29/2015  . Abdominal pain, chronic, epigastric 11/01/2014  . 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 ([place]) 05/30/2014  . Diverticulitis of colon without hemorrhage 08/07/2013  . Prolapse of female pelvic organs 06/22/2013  . Neck pain  03/10/2013  . Vaginal atrophy 03/09/2013  . Rectocele 03/09/2013  . Cystocele 03/09/2013  . Female pelvic pain 03/09/2013  . Urinary incontinence 03/09/2013  . COPD (chronic obstructive pulmonary disease) ([place]) 01/16/2011  . CARCINOMA, LUNG, NONSMALL CELL 12/17/2010  . Rheumatoid arthritis ([place]) 12/17/2010  . Dyspnea on exertion 12/17/2010    Olawale Marney, PT 04/18/2019, 2:00 PM  [facility] [address] [place], [place], [address] Phone: [phone]   Fax:  [phone]  Name: CHANEY INGRAM MRN: [id number] Date of Birth: [birth year]

## 2019-04-19 ENCOUNTER — Ambulatory Visit: Payer: Medicare Other | Admitting: Pulmonary Disease

## 2019-04-20 DIAGNOSIS — M5033 Other cervical disc degeneration, cervicothoracic region: Secondary | ICD-10-CM | POA: Diagnosis not present

## 2019-04-20 DIAGNOSIS — M5030 Other cervical disc degeneration, unspecified cervical region: Secondary | ICD-10-CM | POA: Diagnosis not present

## 2019-04-20 DIAGNOSIS — M1712 Unilateral primary osteoarthritis, left knee: Secondary | ICD-10-CM | POA: Diagnosis not present

## 2019-04-28 ENCOUNTER — Encounter: Payer: Self-pay | Admitting: Rehabilitative and Restorative Service Providers"

## 2019-04-28 ENCOUNTER — Ambulatory Visit: Payer: Medicare Other | Attending: Internal Medicine | Admitting: Rehabilitative and Restorative Service Providers"

## 2019-04-28 ENCOUNTER — Other Ambulatory Visit: Payer: Self-pay

## 2019-04-28 DIAGNOSIS — H8111 Benign paroxysmal vertigo, right ear: Secondary | ICD-10-CM

## 2019-04-28 DIAGNOSIS — R2689 Other abnormalities of gait and mobility: Secondary | ICD-10-CM | POA: Insufficient documentation

## 2019-04-28 NOTE — Patient Instructions (Addendum)
Access Code: [email or web address]  URL: [email or web address]  Date: 04/28/2019  Prepared by: Rudell Cobb   Exercises Brandt-Daroff Vestibular Exercise - 5 reps - 3x daily - 7x weekly Standing Single Leg Stance with Unilateral Counter Support - 3 reps - 1 sets - 10 seconds hold - 1x daily - 7x weekly   [place] Program Contact Information Location Description Cost  A Matter of Balance [phone]  [email or web address]  Carloyn Manner B. Culler, Jr. [place]. [address] [place], [place] [address]  8 sessions; 1x/week  Designed to reduce the fear of falling and increase activity levels among older adults. This program includes eight two hour sessions for a small group lead by two trained facilitators. During the class, participants learn to: (1) view falls as controllable (2) set goals for increasing activity (3) make changes to reduce fall risk at home (4) exercise to increase strength and balance.   Free   [place] [phone]  [email or web address]  [facility] [address], [place] [address] (Room 305)  [place]on [place] classes for beginners to advanced  Monday 7:00pm-8:30pm Thursday 10:00am-11:30am Donation based  [employer] - H.O.P.E (Helping Others Participate in Exercise) [phone]  [email or web address] .[email or web address] Dorinda [place]. [facility] for [place]. [place], [place] [address]  Participants regularly experience physical, cognitive, social, and quality of life benefits. Affordably educate participants on the benefits and proper techniques of exercise, including aerobic, resistance, balance, and flexibility training. Per semester =$140  Summer = $115  Moving for Better Balance [employer]: [phone]  [email or web address]  [facility] [place], [place] [address] 2 classes/week for 12-week exercise  program that utilizes principles of Tai Chi by teaching 8 movements that are modified especially for falls prevention. The program works to improve balance, muscular strength, flexibility, and mobility to enhance health and improve daily activities Covered by some insurance plans  A.H.O.Y. (Add Health to Our Years)  Airs on Television: [place] and 1p; Monday - Friday  -Participating [place] [phone] [address] [place], [place] [address]  [facility] [phone] [address] [place], [place] [address]  [place] [phone] [place], [place] [address]     [facility] [place], [place] [address]  [facility] [phone] [address] [place], [place] [address]  [facility] [phone] [place], [place] [phone] [place], [place] [address]   [place] [address] [place], [place] [address]   Classes designed for seniors ages 41+. Classes include warm up, aerobics, cool down, and strength training with hand weights and/or exercise bands. The workouts can be modified to you to [facility] your abilities.  Free  [place] [phone]  [email or web address] Location for Summer - Early Fall 2018: [place] [facility] [place] [address] [place], [place] [address]   Location Early Fall 2018 and beyond: [place] [address] [place], [place] [address]    Hours: Monday - Friday 8:30am - 5:00pm Activities offered include: exercise programs, clubs and social groups, evidence-based workshops, day trips, guest speakers and presentations, games and bingo, crafts, shopping trips, monthly cooking school, monthly blood pressure and sugar checks, mini health screenings, seasonal events and parties, tax assistance, legal  assistance, transportation services and information/referral services.   Free     [place], [place] [address] [place] [address] [place], [place] [address] [phone]    In this low-impact, slow-motion exercise. The movements are usually circular and never forced, the muscles are relaxed rather than tensed, the joints are not fully extended or bent, and connective tissues are not stretched. Tai chi can be easily adapted for anyone, from the most fit to people confined to wheelchairs or recovering from surgery.   $25/month for members   $40/month for non- members     [employer] [phone]  [email or web address]. [place], [place] [address]   "Senior H20" Class description: Beginner level intensity. An enjoyable, safe exercise program designed to increase feeling of wellbeing and improve performance of daily activities. This class focuses on muscular and joint strength.    Covered by some insurance plans   A.C.T. by Dedra Skeens   [phone] [email or web address]   [address] [place], [place] [address]   "ACTION" classes: clients meet for a consult, and are placed in a group depending on ability/needs. Classes are 30-45 minutes including machine and chair work. All aspects of fitness are considered (i.e. balance, strength, flexibility, coordination, as well as cognition in some cases).  Accepts [employer] membership programs  For seniors (65+): $26/month   [place]  [address] [place], [place] [address]  Ages 61+   A.H.O.Y classes, fitness room, aquatic fitness classes, exercises classes for injury prevention, strength, balance and flexibility.   $10/month (discount = $100/year)  for pool & fitness room use   FREE for exercise classes  [employer] For Eligibility Questions: [phone]  OR  email:  [email or web address]  Website: http://www.silversneakers.com/help   Locations affiliated with Silver Sne[employer] programming:   A.C.Darene Lamer by Dedra Skeens [phone] [email or web address] [place], [place] [address]  Lovenia Shuck [employer] [phone] FileDoors.nl [place], [place] [address]   [facility] [phone] [email or web address]. [place], [place] [address]  -[employer] Locations for [place] [phone] [address] [place], [place] [address]  [facility] [place], [place] [address]  Melinda Crutch [facility] [phone] [address] [place], [place] [address]  [facility] [phone] [address] [place], [place] [address]  [employer] [phone] [place], [place] [address]  [facility] [employer] [phone] [place], [place] [address]  [place] [phone] [address] [place], [place] [address]  [employer] [place],  [address]  [place] [phone] [address] [place],  [address]  Classes designed for older adults to improve strength, flexibility, balance and endurance. Covered by some insurance plans including a fitness center membership at participating locations

## 2019-04-28 NOTE — Therapy (Signed)
[facility] [address] [place], [place], [address] Phone: [phone]   Fax:  [phone]  Physical Therapy Treatment  Patient Details  Name: Lisa Vega MRN: [id number] Date of Birth: 1932-10-10 Referring Provider (PT): Arville Care, MD   Encounter Date: 04/28/2019  PT End of Session - 04/28/19 1420    Visit Number  2    Number of Visits  4    Date for PT Re-Evaluation  05/18/19    Authorization Type  medicare and federal BCBS/ 10th visit PN note    PT Start Time  1414    PT Stop Time  1450    PT Time Calculation (min)  36 min    Activity Tolerance  Patient tolerated treatment well    Behavior During Therapy  [facility] for tasks assessed/performed       Past Medical History:  Diagnosis Date  . Anxiety   . Arthritis    RA  . BPV (benign positional vertigo) 03/10/2018  . Cancer ([place])    lung ca  . Carotid stenosis, left 09/19/2018  . COPD (chronic obstructive pulmonary disease) ([place])   . Depression   . Dyspnea    with exertion  . GERD (gastroesophageal reflux disease)   . Headache syndrome 03/09/2019  . Hypercholesteremia   . Hypothyroidism   . Memory difficulties 10/28/2015  . Pneumonia     x3  last time 2017  . Pre-diabetes   . Rheumatoid arthritis(714.0)   . Thyroid disease   . Tremor 10/28/2015   Jaw tremor  . Uterine prolapse   . Vitamin D deficiency     Past Surgical History:  Procedure Laterality Date  . ANGIOPLASTY Left 10/10/2018   Procedure: ANGIOPLASTY using 1cm x 14cm Xenosure Patch;  Surgeon: Marty Heck, MD;  Location: [facility];  Service: Vascular;  Laterality: Left;  . APPENDECTOMY    . COLONOSCOPY W/ POLYPECTOMY    . ENDARTERECTOMY Left 10/10/2018   Procedure: ENDARTERECTOMY CAROTID;  Surgeon: Marty Heck, MD;  Location: [facility];  Service: Vascular;  Laterality: Left;  . EYE SURGERY Bilateral    cataract  . FOOT SURGERY Bilateral    arthritis  . Hands  Bilateral     Arthritis  . HEMORRHOID SURGERY    . right lobectomy  10/2004  . TONSILLECTOMY AND ADENOIDECTOMY      There were no vitals filed for this visit.   CLINIC OPERATION CHANGES: [facility] is operating at a low capacity due to COVID-19.  The patient was brought into the clinic for evaluation and/or treatment following universal masking by staff, social distancing, and <10 people in the clinic.  The patient's COVID risk of complications score is 6.  The patient also wore a mask.    Subjective Assessment - 04/28/19 1419    Subjective  The patient reports that she no longer has dizziness when lying on her right side.  She has had 2 episodes of dizziness when sitting still.   She has also had cortison injections since the last time she was here.    Pertinent History  L carotid artery surgery 10/11/2019, rheumatoid arthritis (tolerated epley's in the past without neck issues)    Patient Stated Goals  Get relief from vertigo    Currently in Pain?  Yes   "a lot of my pain is in the back of my neck with RA."        Vestibular Assessment - 04/28/19 1422  Vestibular Assessment   General Observation  Patient ambulates into clinic indep.      Positional Testing   Sidelying Test  Sidelying Right;Sidelying Left      Sidelying Right   Sidelying Right Duration  none  *Feels a slight sensation of needing it to settle before she moves again.    Sidelying Right Symptoms  No nystagmus      Sidelying Left   Sidelying Left Duration  none    Sidelying Left Symptoms  No nystagmus               [facility] Adult PT Treatment/Exercise - 04/28/19 1431      Neuro Re-ed    Neuro Re-ed Details   Single leg stance, tandem stance.      Vestibular Treatment/Exercise - 04/28/19 1427      Vestibular Treatment/Exercise   Vestibular Treatment Provided  Habituation    Habituation Exercises  Wise   Number of Reps   2            PT  Education - 04/28/19 1510    Education Details  HEP updated for habituation, single leg stance    Person(s) Educated  Patient    Methods  Explanation;Demonstration;Handout    Comprehension  Returned demonstration;Verbalized understanding          PT Long Term Goals - 04/28/19 1421      PT LONG TERM GOAL #1   Title  The patient will have negative positional testing indicating resolution of BPPV    Time  4    Period  Weeks    Status  Achieved      PT LONG TERM GOAL #2   Title  The patient will subjectively report ability to perform bed mobility without vertigo or dizziness.    Time  4    Period  Weeks    Status  Achieved      PT LONG TERM GOAL #3   Title  The patient will return demo HEP for self mgmt of BPPV as appropriate.    Time  4    Period  Weeks    Status  Achieved            Plan - 04/28/19 1510    Clinical Impression Statement  The patient met 3/3 LTGs.  She requests to maintain her status "on hold" in case she has any difficulties with vertigo in the next few weeks.  She notes "it has been with  me awhile and it may come back."      PT Treatment/Interventions  ADLs/Self Care Home Management;Therapeutic activities;Therapeutic exercise;Neuromuscular re-education;Patient/family education;Gait training;Canalith Repostioning;Vestibular    PT Next Visit Plan  No further visits scheduled; patient to call and f/u if vertigo returns.    Consulted and Agree with Plan of Care  Patient       Patient will benefit from skilled therapeutic intervention in order to improve the following deficits and impairments:  Abnormal gait, Dizziness, Decreased balance  Visit Diagnosis: BPPV (benign paroxysmal positional vertigo), right  Other abnormalities of gait and mobility     Problem List Patient Active Problem List   Diagnosis Date Noted  . Headache syndrome 03/09/2019  . Upper respiratory tract infection 01/23/2019  . Carotid stenosis 10/10/2018  . Carotid stenosis,  left 09/19/2018  . Benign positional vertigo 03/10/2018  . History of lung cancer 11/29/2017  . Difficulty sleeping 09/22/2017  . Diabetes ([place]) 05/13/2017  . Fatigue 05/12/2017  .  CAP (community acquired pneumonia) 05/03/2017  . Orthopnea 01/12/2017  . GERD (gastroesophageal reflux disease) 11/09/2016  . Osteoporosis 05/06/2016  . Depression with anxiety 05/06/2016  . Hiatal hernia 05/06/2016  . Hypothyroidism 05/06/2016  . Tremor 10/28/2015  . Memory difficulties 10/28/2015  . Hoarseness 10/14/2015  . History of vitamin D deficiency 05/02/2015  . Other constipation 01/29/2015  . Abdominal pain, chronic, epigastric 11/01/2014  . 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 ([place]) 05/30/2014  . Diverticulitis of colon without hemorrhage 08/07/2013  . Prolapse of female pelvic organs 06/22/2013  . Neck pain 03/10/2013  . Vaginal atrophy 03/09/2013  . Rectocele 03/09/2013  . Cystocele 03/09/2013  . Female pelvic pain 03/09/2013  . Urinary incontinence 03/09/2013  . COPD (chronic obstructive pulmonary disease) ([place]) 01/16/2011  . CARCINOMA, LUNG, NONSMALL CELL 12/17/2010  . Rheumatoid arthritis ([place]) 12/17/2010  . Dyspnea on exertion 12/17/2010    Lisa Vega, PT 04/28/2019, 3:11 PM  [place] [address] [place], [place], [address] Phone: [phone]   Fax:  [phone]  Name: Lisa Vega MRN: [id number] Date of Birth: 12-20-1932

## 2019-05-06 ENCOUNTER — Other Ambulatory Visit: Payer: Self-pay | Admitting: Neurology

## 2019-05-06 ENCOUNTER — Encounter (HOSPITAL_COMMUNITY): Payer: Self-pay | Admitting: Emergency Medicine

## 2019-05-06 ENCOUNTER — Emergency Department (HOSPITAL_COMMUNITY)
Admission: EM | Admit: 2019-05-06 | Discharge: 2019-05-06 | Disposition: A | Discharge status: Home / Self Care | Payer: Medicare Other | Attending: Emergency Medicine | Admitting: Emergency Medicine

## 2019-05-06 ENCOUNTER — Emergency Department (HOSPITAL_COMMUNITY): Payer: Medicare Other

## 2019-05-06 ENCOUNTER — Other Ambulatory Visit: Payer: Self-pay

## 2019-05-06 DIAGNOSIS — W010XXA Fall on same level from slipping, tripping and stumbling without subsequent striking against object, initial encounter: Secondary | ICD-10-CM | POA: Diagnosis not present

## 2019-05-06 DIAGNOSIS — Y929 Unspecified place or not applicable: Secondary | ICD-10-CM | POA: Diagnosis not present

## 2019-05-06 DIAGNOSIS — E039 Hypothyroidism, unspecified: Secondary | ICD-10-CM | POA: Diagnosis not present

## 2019-05-06 DIAGNOSIS — S60222A Contusion of left hand, initial encounter: Secondary | ICD-10-CM

## 2019-05-06 DIAGNOSIS — M79641 Pain in right hand: Secondary | ICD-10-CM | POA: Diagnosis not present

## 2019-05-06 DIAGNOSIS — Z87891 Personal history of nicotine dependence: Secondary | ICD-10-CM | POA: Insufficient documentation

## 2019-05-06 DIAGNOSIS — Z79899 Other long term (current) drug therapy: Secondary | ICD-10-CM | POA: Insufficient documentation

## 2019-05-06 DIAGNOSIS — J449 Chronic obstructive pulmonary disease, unspecified: Secondary | ICD-10-CM | POA: Insufficient documentation

## 2019-05-06 DIAGNOSIS — Z85118 Personal history of other malignant neoplasm of bronchus and lung: Secondary | ICD-10-CM | POA: Insufficient documentation

## 2019-05-06 DIAGNOSIS — S6991XA Unspecified injury of right wrist, hand and finger(s), initial encounter: Secondary | ICD-10-CM | POA: Diagnosis not present

## 2019-05-06 DIAGNOSIS — Y999 Unspecified external cause status: Secondary | ICD-10-CM | POA: Insufficient documentation

## 2019-05-06 DIAGNOSIS — Y93H2 Activity, gardening and landscaping: Secondary | ICD-10-CM | POA: Diagnosis not present

## 2019-05-06 DIAGNOSIS — S6992XA Unspecified injury of left wrist, hand and finger(s), initial encounter: Secondary | ICD-10-CM | POA: Diagnosis present

## 2019-05-06 MED ORDER — OXYCODONE-ACETAMINOPHEN 5-325 MG PO TABS: 2.0000 | ORAL_TABLET | Freq: Four times a day (QID) | ORAL | 0 refills | Status: DC | PRN

## 2019-05-06 MED ORDER — IBUPROFEN 200 MG PO TABS
400.0000 mg | ORAL_TABLET | Freq: Once | ORAL | Status: AC
  Administered 2019-05-06: 21:00:00 400 mg via ORAL
  Filled 2019-05-06: qty 2

## 2019-05-06 MED ORDER — OXYCODONE-ACETAMINOPHEN 5-325 MG PO TABS
1.0000 | ORAL_TABLET | Freq: Once | ORAL | Status: AC
  Administered 2019-05-06: 1 via ORAL
  Filled 2019-05-06: qty 1

## 2019-05-06 NOTE — Discharge Instructions (Addendum)
Your x-rays are inconclusive. You have a severe amount of arthritis in your hands which makes them hard to interpret. You have some abnormalities which appear to be chronic but, with the degree of tenderness you area having, we are going to treat this likely a fracture. Follow-up with the hand surgeon. Take the pain medicine as needed.

## 2019-05-06 NOTE — ED Triage Notes (Signed)
Pt reports tripping while planting and catching self with right hand now having pain from thumb to mid forearm. Pt denies any LOC

## 2019-05-07 NOTE — ED Provider Notes (Signed)
[facility] DEPT Provider Note   CSN: [id number] Arrival date & time: 05/06/19  1936    History   Chief Complaint Chief Complaint  Patient presents with   Hand Pain    HPI Lisa Vega is a 85 y.o. female.  HPI   86yF with R hand pain. Was gardening when lost balance and fell. [place]. Pain in r hand/wrist. No other acute injuries. No numbness, tingling. No intervention prior to arrival.   Past Medical History:  Diagnosis Date   Anxiety    Arthritis    RA   BPV (benign positional vertigo) 03/10/2018   Cancer ([place])    lung ca   Carotid stenosis, left 09/19/2018   COPD (chronic obstructive pulmonary disease) (HCC)    Depression    Dyspnea    with exertion   GERD (gastroesophageal reflux disease)    Headache syndrome 03/09/2019   Hypercholesteremia    Hypothyroidism    Memory difficulties 10/28/2015   Pneumonia     x3  last time 2017   Pre-diabetes    Rheumatoid arthritis(714.0)    Thyroid disease    Tremor 10/28/2015   Jaw tremor   Uterine prolapse    Vitamin D deficiency     Patient Active Problem List   Diagnosis Date Noted   Headache syndrome 03/09/2019   Upper respiratory tract infection 01/23/2019   Carotid stenosis 10/10/2018   Carotid stenosis, left 09/19/2018   Benign positional vertigo 03/10/2018   History of lung cancer 11/29/2017   Difficulty sleeping 09/22/2017   Diabetes ([place]) 05/13/2017   Fatigue 05/12/2017   CAP (community acquired pneumonia) 05/03/2017   Orthopnea 01/12/2017   GERD (gastroesophageal reflux disease) 11/09/2016   Osteoporosis 05/06/2016   Depression with anxiety 05/06/2016   Hiatal hernia 05/06/2016   Hypothyroidism 05/06/2016   Tremor 10/28/2015   Memory difficulties 10/28/2015   Hoarseness 10/14/2015   History of vitamin D deficiency 05/02/2015   Other constipation 01/29/2015   Abdominal pain, chronic, epigastric 11/01/2014    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 ([place]) 05/30/2014   Diverticulitis of colon without hemorrhage 08/07/2013   Prolapse of female pelvic organs 06/22/2013   Neck pain 03/10/2013   Vaginal atrophy 03/09/2013   Rectocele 03/09/2013   Cystocele 03/09/2013   Female pelvic pain 03/09/2013   Urinary incontinence 03/09/2013   COPD (chronic obstructive pulmonary disease) ([place]) 01/16/2011   CARCINOMA, LUNG, NONSMALL CELL 12/17/2010   Rheumatoid arthritis ([place]) 12/17/2010   Dyspnea on exertion 12/17/2010    Past Surgical History:  Procedure Laterality Date   ANGIOPLASTY Left 10/10/2018   Procedure: ANGIOPLASTY using 1cm x 14cm Xenosure Patch;  Surgeon: Marty Heck, MD;  Location: [facility] OR;  Service: Vascular;  Laterality: Left;   APPENDECTOMY     COLONOSCOPY W/ POLYPECTOMY     ENDARTERECTOMY Left 10/10/2018   Procedure: ENDARTERECTOMY CAROTID;  Surgeon: Marty Heck, MD;  Location: [facility] OR;  Service: Vascular;  Laterality: Left;   EYE SURGERY Bilateral    cataract   FOOT SURGERY Bilateral    arthritis   Hands  Bilateral    Arthritis   HEMORRHOID SURGERY     right lobectomy  10/2004   TONSILLECTOMY AND ADENOIDECTOMY       OB History    Gravida  5   Para  3   Term      Preterm      AB  2   Living  3     SAB  1  TAB      Ectopic  1   Multiple      Live Births               Home Medications    Prior to Admission medications   Medication Sig Start Date End Date Taking? Authorizing Provider  acetaminophen (TYLENOL) 325 MG tablet Take 650 mg by mouth every 6 (six) hours as needed for mild pain.     [provider]  ALPRAZolam Duanne Moron) 0.25 MG tablet Take 1 tablet (0.25 mg total) by mouth daily as needed. for anxiety Patient taking differently: Take 0.25 mg by mouth daily as needed for anxiety.  10/03/18   Binnie Rail, MD  blood glucose meter kit and supplies KIT Dispense based on patient and insurance  preference. Use up to four times daily as directed. (FOR E11.9). 03/14/18   Binnie Rail, MD  budesonide-formoterol (SYMBICORT) 160-4.5 MCG/ACT inhaler 2 puffs BID Patient taking differently: Inhale 2 puffs into the lungs 2 (two) times daily.  09/16/18   Juanito Doom, MD  DULoxetine (CYMBALTA) 30 MG capsule TAKE 1 CAPSULE BY MOUTH EVERY DAY 10/12/18   Kathrynn Ducking, MD  folic acid (FOLVITE) 1 MG tablet Take 1 mg by mouth daily.    [provider]  gabapentin (NEURONTIN) 100 MG capsule Take 2 capsules (200 mg total) by mouth 3 (three) times daily. 03/09/19   Kathrynn Ducking, MD  ibuprofen (ADVIL,MOTRIN) 200 MG tablet Take 200 mg by mouth every 6 (six) hours as needed for headache or mild pain.    [provider]  linaclotide (LINZESS) 72 MCG capsule Take 72 mcg by mouth daily as needed (constipation).     [provider]  meclizine (ANTIVERT) 25 MG tablet Take 0.5-1 tablets (12.5-25 mg total) by mouth 3 (three) times daily as needed for dizziness. 09/19/18   Kathrynn Ducking, MD  methotrexate 50 MG/2ML injection Inject 17.5 mg into the skin every Sunday. Patient is not taking it Sunday, October 13- patient reported 07/21/18   [provider]  mupirocin ointment (BACTROBAN) 2 % Place 1 application into the nose 2 (two) times daily. 01/17/19   Fontaine, Belinda Block, MD  nitrofurantoin, macrocrystal-monohydrate, (MACROBID) 100 MG capsule Take 1 capsule (100 mg total) by mouth 2 (two) times daily. 01/17/19   Fontaine, Belinda Block, MD  NONFORMULARY OR COMPOUNDED ITEM Estradiol .02% 1 ML Prefilled Applicator Sig: apply vaginally twice a week #90 Day Supply with 4 refills 06/17/17   Terrance Mass, MD  omeprazole (PRILOSEC) 40 MG capsule Take 40 mg by mouth daily.    [provider]  [facility] DELICA LANCETS 16S MISC USE TO CHECK SUGARS UP TO 4 TIMES A DAY 03/19/18   [provider]  oxyCODONE-acetaminophen (PERCOCET/ROXICET) 5-325 MG tablet Take 2  tablets by mouth every 6 (six) hours as needed for severe pain. 05/06/19   Virgel Manifold, MD  PROAIR HFA [phone] Base) MCG/ACT inhaler USE 1-2 PUFFS EVERY 4-6 HOURS AS NEEDED Patient taking differently: Inhale 2 puffs into the lungs every 6 (six) hours as needed for wheezing.  10/05/16   Binnie Rail, MD  ranitidine (ZANTAC) 150 MG tablet TAKE 1 TABLET BY MOUTH EVERY EVENING AT BEDTIME AS NEEDED FOR HEARTBURN Patient taking differently: Take 150 mg by mouth at bedtime as needed for heartburn.  03/14/18   Binnie Rail, MD  sertraline (ZOLOFT) 100 MG tablet TAKE 1 TABLET BY MOUTH EVERY DAY Patient taking differently: Take  50 mg by mouth daily.  07/25/18   Binnie Rail, MD  SYNTHROID 88 MCG tablet TAKE 1 TABLET DAILY Patient taking differently: Take 88 mcg by mouth daily.  12/01/17   Binnie Rail, MD  Tiotropium Bromide Monohydrate (SPIRIVA RESPIMAT) 2.5 MCG/ACT AERS Inhale 2 puffs into the lungs daily. Patient taking differently: Inhale 1 puff into the lungs daily as needed (wheezing).  08/19/18   Juanito Doom, MD  Tiotropium Bromide Monohydrate (SPIRIVA RESPIMAT) 2.5 MCG/ACT AERS Inhale 2 puffs into the lungs daily. 03/01/19   Fenton Foy, NP  traMADol (ULTRAM) 50 MG tablet Take 50 mg by mouth 3 (three) times daily as needed for moderate pain.  05/07/18   [provider]  triamcinolone cream (KENALOG) 0.1 % Apply 1 application topically daily as needed. Thin layer on vulva as needed Patient taking differently: Apply 1 application topically daily as needed (vulva).  10/03/18   Binnie Rail, MD    Family History Family History  Problem Relation Age of Onset   Emphysema Mother    Asthma Mother    Lung cancer Father    Pancreatic cancer Father    Bone cancer Father    Heart disease Brother        x1   Heart disease Sister        x2   Stroke Maternal Grandfather     Social History Social History   Tobacco Use   Smoking status: Former Smoker    Packs/day:  1.00    Years: 20.00    Pack years: 20.00    Types: Cigarettes    Last attempt to quit: 11/10/2004    Years since quitting: 14.4   Smokeless tobacco: Never Used  Substance Use Topics   Alcohol use: Yes    Alcohol/week: 1.0 standard drinks    Types: 1 Glasses of wine per week    Comment: socially   Drug use: No     Allergies   Codeine; Demerol; and Meperidine hcl   Review of Systems Review of Systems  All systems reviewed and negative, other than as noted in HPI.  Physical Exam Updated Vital Signs BP (!) 190/70 (BP Location: Left Arm)    Pulse 60    Temp 98 F (36.7 C) (Oral)    Resp 16    Ht _0  (1.626 m)    Wt 59 kg    SpO2 98%    BMI 22.31 kg/m   Physical Exam Vitals signs and nursing note reviewed.  Constitutional:      General: She is not in acute distress.    Appearance: She is well-developed.  HENT:     Head: Normocephalic and atraumatic.  Eyes:     General:        Right eye: No discharge.        Left eye: No discharge.     Conjunctiva/sclera: Conjunctivae normal.  Neck:     Musculoskeletal: Neck supple.  Cardiovascular:     Rate and Rhythm: Normal rate and regular rhythm.     Heart sounds: Normal heart sounds. No murmur. No friction rub. No gallop.   Pulmonary:     Effort: Pulmonary effort is normal. No respiratory distress.     Breath sounds: Normal breath sounds.  Abdominal:     General: There is no distension.     Palpations: Abdomen is soft.     Tenderness: There is no abdominal tenderness.  Musculoskeletal:  General: Tenderness and deformity present.     Comments: Severe arthritic changes of hand. Point tenderness near base of fifth metatarsal, adjacent carpal bones and distal ulna. No acute appearing swelling. No break in skin.   Skin:    General: Skin is warm and dry.  Neurological:     Mental Status: She is alert.  Psychiatric:        Behavior: Behavior normal.        Thought Content: Thought content normal.      ED  Treatments / Results  Labs (all labs ordered are listed, but only abnormal results are displayed) Labs Reviewed - No data to display  EKG None  Radiology Dg Hand Complete Right  Result Date: 05/06/2019 CLINICAL DATA:  85 year old female status post trip and fall with pain. EXAM: RIGHT HAND - COMPLETE 3+ VIEW COMPARISON:  01/30/2010 hand radiographs. FINDINGS: Chronic MCP and IP subluxations. Osteopenia. Chronic deformity of the right ulnar styloid is stable. Carpal bone alignment is maintained. Small chronic appearing ossific fragment along the ulnar base of the 5th metacarpal. No acute fracture or dislocation identified. IMPRESSION: Chronic arthropathic appearing MCP and IP joint subluxations with no acute fracture or dislocation identified. Electronically Signed   By: Genevie Ann M.D.   On: 05/06/2019 20:19    Procedures Procedures (including critical care time)  Medications Ordered in ED Medications  ibuprofen (ADVIL) tablet 400 mg (400 mg Oral Given 05/06/19 2109)  oxyCODONE-acetaminophen (PERCOCET/ROXICET) 5-325 MG per tablet 1 tablet (1 tablet Oral Given 05/06/19 2109)     Initial Impression / Assessment and Plan / ED Course  I have reviewed the triage vital signs and the nursing notes.  Pertinent labs & imaging results that were available during my care of the patient were reviewed by me and considered in my medical decision making (see chart for details).       86yF with R hand pain after fall. Tender ulnar aspect of proximal hand. X-ray more consistent with chronic findings but will splint for now. Prn pain meds. Hand follow-up.   Final Clinical Impressions(s) / ED Diagnoses   Final diagnoses:  Contusion of left hand, initial encounter    ED Discharge Orders         Ordered    oxyCODONE-acetaminophen (PERCOCET/ROXICET) 5-325 MG tablet  Every 6 hours PRN     05/06/19 2113           Virgel Manifold, MD 05/07/19 2144

## 2019-05-08 ENCOUNTER — Other Ambulatory Visit: Payer: Self-pay

## 2019-05-10 ENCOUNTER — Other Ambulatory Visit: Payer: Self-pay

## 2019-05-10 ENCOUNTER — Ambulatory Visit (INDEPENDENT_AMBULATORY_CARE_PROVIDER_SITE_OTHER): Payer: Medicare Other | Admitting: Gynecology

## 2019-05-10 ENCOUNTER — Encounter: Payer: Self-pay | Admitting: Gynecology

## 2019-05-10 VITALS — BP 130/74

## 2019-05-10 DIAGNOSIS — R300 Dysuria: Secondary | ICD-10-CM | POA: Diagnosis not present

## 2019-05-10 DIAGNOSIS — Z4689 Encounter for fitting and adjustment of other specified devices: Secondary | ICD-10-CM

## 2019-05-10 DIAGNOSIS — M1811 Unilateral primary osteoarthritis of first carpometacarpal joint, right hand: Secondary | ICD-10-CM | POA: Insufficient documentation

## 2019-05-10 DIAGNOSIS — M069 Rheumatoid arthritis, unspecified: Secondary | ICD-10-CM | POA: Insufficient documentation

## 2019-05-10 DIAGNOSIS — N8189 Other female genital prolapse: Secondary | ICD-10-CM

## 2019-05-10 NOTE — Progress Notes (Signed)
    Lisa Vega 20-May-1932 [id number]        85 y.o.  [id number] presents for pessary check.  Also with some mild dysuria with frequency.  No urgency low back pain fever or chills.  Past medical history,surgical history, problem list, medications, allergies, family history and social history were all reviewed and documented in the EPIC chart.  Directed ROS with pertinent positives and negatives documented in the history of present illness/assessment and plan.  Exam: [name] assistant Vitals:   05/10/19 1217  BP: 130/74   General appearance:  Normal Spine without CVA tenderness Abdomen soft nontender without masses guarding rebound Pelvic external BUS vagina with atrophic changes.  Ring pessary removed cleansed and replaced without difficulty.  Vaginal mucosa without irritation or erosion.  Cervix flush with the upper vagina.  Uterus grossly normal size midline mobile nontender.  Adnexa without masses or tenderness.  Assessment/Plan:  85 y.o. [id number] for pessary check with pelvic relaxation doing well.  Pessary cleansed and replaced without difficulty.  Also has some mild dysuria and frequency.  Her urine analysis appears negative.  Will culture for completeness and treat if shows bacteriuria.  Will monitor symptoms for now.  Regardless she will call if her symptoms persist.  Otherwise follow-up in 4 to 6 months for pessary recheck    Anastasio Auerbach MD, 12:37 PM 05/10/2019

## 2019-05-10 NOTE — Patient Instructions (Signed)
Office will follow-up if the urine culture grows out any bacteria.  Call regardless if your urinary symptoms continue.  Follow-up in 4 to 6 months for pessary recheck.

## 2019-05-12 LAB — URINE CULTURE
MICRO NUMBER:: 474481
Result:: NO GROWTH
SPECIMEN QUALITY:: ADEQUATE

## 2019-05-12 LAB — URINALYSIS, COMPLETE W/RFL CULTURE
Bacteria, UA: NONE SEEN /HPF
Bilirubin Urine: NEGATIVE
Glucose, UA: NEGATIVE
Hyaline Cast: NONE SEEN /LPF
Ketones, ur: NEGATIVE
Leukocyte Esterase: NEGATIVE
Nitrites, Initial: NEGATIVE
Protein, ur: NEGATIVE
RBC / HPF: NONE SEEN /HPF (ref 0–2)
Specific Gravity, Urine: 1.006 (ref 1.001–1.03)
pH: 5.5 (ref 5.0–8.0)

## 2019-05-12 LAB — CULTURE INDICATED

## 2019-05-18 ENCOUNTER — Telehealth: Payer: Self-pay | Admitting: *Deleted

## 2019-05-18 MED ORDER — LEVOTHYROXINE SODIUM 88 MCG PO TABS: 88.0000 ug | ORAL_TABLET | Freq: Every day | ORAL | 0 refills | Status: DC

## 2019-05-23 DIAGNOSIS — M0579 Rheumatoid arthritis with rheumatoid factor of multiple sites without organ or systems involvement: Secondary | ICD-10-CM | POA: Diagnosis not present

## 2019-05-23 DIAGNOSIS — M2550 Pain in unspecified joint: Secondary | ICD-10-CM | POA: Diagnosis not present

## 2019-05-23 DIAGNOSIS — Z79899 Other long term (current) drug therapy: Secondary | ICD-10-CM | POA: Diagnosis not present

## 2019-05-23 DIAGNOSIS — R634 Abnormal weight loss: Secondary | ICD-10-CM | POA: Diagnosis not present

## 2019-05-23 DIAGNOSIS — M150 Primary generalized (osteo)arthritis: Secondary | ICD-10-CM | POA: Diagnosis not present

## 2019-05-23 DIAGNOSIS — R5382 Chronic fatigue, unspecified: Secondary | ICD-10-CM | POA: Diagnosis not present

## 2019-05-23 NOTE — Progress Notes (Deleted)
Subjective:    Patient ID: Lisa Vega, female    DOB: [birth year], 85 y.o.   MRN: [id number]  HPI The patient is here for follow up.  She is exercising regularly.    Diabetes: She is taking her medication daily as prescribed. She is compliant with a diabetic diet.    She denies numbness/tingling in her feet and foot lesions. She is up-to-date with an ophthalmology examination.   Hypothyroidism:  She is taking her medication daily.  She denies any recent changes in energy or weight that are unexplained.   Depression, anxiety: She is taking zoloft daily as prescribed. She takes 1/2 of a xanax prn.  She denies any side effects from the medication. She feels her depression and anxiety are well controlled and she is happy with her current dose of medication.   GERD:  She is taking her medication daily as prescribed.  She denies any GERD symptoms and feels her GERD is well controlled.   RA, OA:  She follows with ortho and rheumatology.    Medications and allergies reviewed with patient and updated if appropriate.  Patient Active Problem List   Diagnosis Date Noted  . Headache syndrome 03/09/2019  . Upper respiratory tract infection 01/23/2019  . Carotid stenosis 10/10/2018  . Carotid stenosis, left 09/19/2018  . Benign positional vertigo 03/10/2018  . History of lung cancer 11/29/2017  . Difficulty sleeping 09/22/2017  . Diabetes ([place]) 05/13/2017  . Fatigue 05/12/2017  . CAP (community acquired pneumonia) 05/03/2017  . Orthopnea 01/12/2017  . GERD (gastroesophageal reflux disease) 11/09/2016  . Osteoporosis 05/06/2016  . Depression with anxiety 05/06/2016  . Hiatal hernia 05/06/2016  . Hypothyroidism 05/06/2016  . Tremor 10/28/2015  . Memory difficulties 10/28/2015  . Hoarseness 10/14/2015  . History of vitamin D deficiency 05/02/2015  . Other constipation 01/29/2015  . Abdominal pain, chronic, epigastric 11/01/2014  . 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  ([place]) 05/30/2014  . Diverticulitis of colon without hemorrhage 08/07/2013  . Prolapse of female pelvic organs 06/22/2013  . Neck pain 03/10/2013  . Vaginal atrophy 03/09/2013  . Rectocele 03/09/2013  . Cystocele 03/09/2013  . Female pelvic pain 03/09/2013  . Urinary incontinence 03/09/2013  . COPD (chronic obstructive pulmonary disease) ([place]) 01/16/2011  . CARCINOMA, LUNG, NONSMALL CELL 12/17/2010  . Rheumatoid arthritis ([place]) 12/17/2010  . Dyspnea on exertion 12/17/2010    Current Outpatient Medications on File Prior to Visit  Medication Sig Dispense Refill  . acetaminophen (TYLENOL) 325 MG tablet Take 650 mg by mouth every 6 (six) hours as needed for mild pain.     Marland Kitchen ALPRAZolam (XANAX) 0.25 MG tablet Take 1 tablet (0.25 mg total) by mouth daily as needed. for anxiety (Patient taking differently: Take 0.25 mg by mouth daily as needed for anxiety. ) 20 tablet 1  . blood glucose meter kit and supplies KIT Dispense based on patient and insurance preference. Use up to four times daily as directed. (FOR E11.9). 1 each 0  . budesonide-formoterol (SYMBICORT) 160-4.5 MCG/ACT inhaler 2 puffs BID (Patient taking differently: Inhale 2 puffs into the lungs 2 (two) times daily. ) 3 Inhaler 3  . folic acid (FOLVITE) 1 MG tablet Take 1 mg by mouth daily.    Marland Kitchen gabapentin (NEURONTIN) 100 MG capsule TAKE 2 CAPSULES BY MOUTH 3 TIMES A DAY 540 capsule 1  . ibuprofen (ADVIL,MOTRIN) 200 MG tablet Take 200 mg by mouth every 6 (six) hours as needed for headache or mild pain.    Marland Kitchen  levothyroxine (SYNTHROID) 88 MCG tablet Take 1 tablet (88 mcg total) by mouth daily. 90 tablet 0  . linaclotide (LINZESS) 72 MCG capsule Take 72 mcg by mouth daily as needed (constipation).     . meclizine (ANTIVERT) 25 MG tablet Take 0.5-1 tablets (12.5-25 mg total) by mouth 3 (three) times daily as needed for dizziness. 30 tablet 5  . methotrexate 50 MG/2ML injection Inject 17.5 mg into the skin every Sunday. Patient is not taking it  Sunday, October 13- patient reported  1  . mupirocin ointment (BACTROBAN) 2 % Place 1 application into the nose 2 (two) times daily. 22 g 1  . NONFORMULARY OR COMPOUNDED ITEM Estradiol .02% 1 ML Prefilled Applicator Sig: apply vaginally twice a week #90 Day Supply with 4 refills (Patient not taking: Reported on 05/10/2019) 1 each 0  . omeprazole (PRILOSEC) 40 MG capsule Take 40 mg by mouth daily.    [name] DELICA LANCETS 62M MISC USE TO CHECK SUGARS UP TO 4 TIMES A DAY  0  . PROAIR HFA 108 (90 Base) MCG/ACT inhaler USE 1-2 PUFFS EVERY 4-6 HOURS AS NEEDED (Patient taking differently: Inhale 2 puffs into the lungs every 6 (six) hours as needed for wheezing. ) 8.5 g 1  . ranitidine (ZANTAC) 150 MG tablet TAKE 1 TABLET BY MOUTH EVERY EVENING AT BEDTIME AS NEEDED FOR HEARTBURN (Patient taking differently: Take 150 mg by mouth at bedtime as needed for heartburn. ) 90 tablet 3  . sertraline (ZOLOFT) 100 MG tablet TAKE 1 TABLET BY MOUTH EVERY DAY (Patient taking differently: Take 50 mg by mouth daily. ) 90 tablet 1  . Tiotropium Bromide Monohydrate (SPIRIVA RESPIMAT) 2.5 MCG/ACT AERS Inhale 2 puffs into the lungs daily. (Patient taking differently: Inhale 1 puff into the lungs daily as needed (wheezing). ) 1 Inhaler 0  . Tiotropium Bromide Monohydrate (SPIRIVA RESPIMAT) 2.5 MCG/ACT AERS Inhale 2 puffs into the lungs daily. 2 Inhaler 0  . traMADol (ULTRAM) 50 MG tablet Take 50 mg by mouth 3 (three) times daily as needed for moderate pain.   0  . triamcinolone cream (KENALOG) 0.1 % Apply 1 application topically daily as needed. Thin layer on vulva as needed (Patient taking differently: Apply 1 application topically daily as needed (vulva). ) 30 g 3   No current facility-administered medications on file prior to visit.     Past Medical History:  Diagnosis Date  . Anxiety   . Arthritis    RA  . BPV (benign positional vertigo) 03/10/2018  . Cancer ([place])    lung ca  . Carotid stenosis, left 09/19/2018   . COPD (chronic obstructive pulmonary disease) ([place])   . Depression   . Dyspnea    with exertion  . GERD (gastroesophageal reflux disease)   . Headache syndrome 03/09/2019  . Hypercholesteremia   . Hypothyroidism   . Memory difficulties 10/28/2015  . Pneumonia     x3  last time 2017  . Pre-diabetes   . Rheumatoid arthritis(714.0)   . Thyroid disease   . Tremor 10/28/2015   Jaw tremor  . Uterine prolapse   . Vitamin D deficiency     Past Surgical History:  Procedure Laterality Date  . ANGIOPLASTY Left 10/10/2018   Procedure: ANGIOPLASTY using 1cm x 14cm Xenosure Patch;  Surgeon: Marty Heck, MD;  Location: [facility];  Service: Vascular;  Laterality: Left;  . APPENDECTOMY    . COLONOSCOPY W/ POLYPECTOMY    . ENDARTERECTOMY Left 10/10/2018   Procedure: ENDARTERECTOMY  CAROTID;  Surgeon: Marty Heck, MD;  Location: [facility];  Service: Vascular;  Laterality: Left;  . EYE SURGERY Bilateral    cataract  . FOOT SURGERY Bilateral    arthritis  . Hands  Bilateral    Arthritis  . HEMORRHOID SURGERY    . right lobectomy  10/2004  . TONSILLECTOMY AND ADENOIDECTOMY      Social History   Socioeconomic History  . Marital status: Widowed    Spouse name: Not on file  . Number of children: 3  . Years of education: LPN  . Highest education level: Not on file  Occupational History  . Occupation: retired    Fish farm manager: RETIRED    Comment: LPN at [employer]  . Financial resource strain: Not hard at all  . Food insecurity:    Worry: Never true    Inability: Never true  . Transportation needs:    Medical: No    Non-medical: No  Tobacco Use  . Smoking status: Former Smoker    Packs/day: 1.00    Years: 20.00    Pack years: 20.00    Types: Cigarettes    Last attempt to quit: 11/10/2004    Years since quitting: 14.5  . Smokeless tobacco: Never Used  Substance and Sexual Activity  . Alcohol use: Yes    Alcohol/week: 1.0 standard drinks     Types: 1 Glasses of wine per week    Comment: socially  . Drug use: No  . Sexual activity: Not Currently    Comment: 1st intercourse- 19, partners- 5,   Lifestyle  . Physical activity:    Days per week: 3 days    Minutes per session: 40 min  . Stress: To some extent  Relationships  . Social connections:    Talks on phone: More than three times a week    Gets together: More than three times a week    Attends religious service: More than 4 times per year    Active member of club or organization: Yes    Attends meetings of clubs or organizations: More than 4 times per year    Relationship status: Widowed  Other Topics Concern  . Not on file  Social History Narrative   Patient drinks about 3 cups of coffee daily.   Patient is right handed.     Family History  Problem Relation Age of Onset  . Emphysema Mother   . Asthma Mother   . Lung cancer Father   . Pancreatic cancer Father   . Bone cancer Father   . Heart disease Brother        x1  . Heart disease Sister        x2  . Stroke Maternal Grandfather     Review of Systems     Objective:  There were no vitals filed for this visit. BP Readings from Last 3 Encounters:  05/10/19 130/74  05/06/19 (!) 190/70  03/09/19 121/62   Wt Readings from Last 3 Encounters:  05/06/19 130 lb (59 kg)  03/09/19 126 lb 5 oz (57.3 kg)  03/01/19 128 lb (58.1 kg)   There is no height or weight on file to calculate BMI.   Physical Exam    Constitutional: Appears well-developed and well-nourished. No distress.  HENT:  Head: Normocephalic and atraumatic.  Neck: Neck supple. No tracheal deviation present. No thyromegaly present.  No cervical lymphadenopathy Cardiovascular: Normal rate, regular rhythm and normal heart sounds.   No murmur  heard. No carotid bruit .  No edema Pulmonary/Chest: Effort normal and breath sounds normal. No respiratory distress. No has no wheezes. No rales.  Skin: Skin is warm and dry. Not diaphoretic.   Psychiatric: Normal mood and affect. Behavior is normal.      Assessment & Plan:    See Problem List for Assessment and Plan of chronic medical problems.

## 2019-05-24 ENCOUNTER — Telehealth: Payer: Self-pay | Admitting: Internal Medicine

## 2019-05-24 ENCOUNTER — Ambulatory Visit: Payer: Medicare Other | Admitting: Internal Medicine

## 2019-05-24 MED ORDER — ALPRAZOLAM 0.25 MG PO TABS: 0.2500 mg | ORAL_TABLET | Freq: Every day | ORAL | 1 refills | Status: DC | PRN

## 2019-05-24 MED ORDER — LEVOTHYROXINE SODIUM 88 MCG PO TABS: 88.0000 ug | ORAL_TABLET | Freq: Every day | ORAL | 0 refills | Status: DC

## 2019-05-24 NOTE — Telephone Encounter (Signed)
Copied from [place] [phone]. Topic: Quick Communication - See Telephone Encounter >> May 24, 2019  8:37 AM Ivar Drape wrote: CRM for notification. See Telephone encounter for: 05/24/19. Patient would like a refill on her ALPRAZolam (XANAX) 0.25 MG tablet medication and have it sent to her preferred pharmacy for this medication [facility] [place], [place].

## 2019-05-24 NOTE — Addendum Note (Signed)
Addended by: Delice Bison E on: 05/24/2019 08:54 AM   Modules accepted: Orders

## 2019-05-24 NOTE — Telephone Encounter (Signed)
Rx resent.

## 2019-05-24 NOTE — Telephone Encounter (Signed)
Patient stated the [facility] did not receive the prescription for levothyroxine (SYNTHROID) 88 MCG tablet medication.

## 2019-05-24 NOTE — Telephone Encounter (Signed)
Last refill was 10/03/18 Last routine OV was 10/03/18 Next OV 05/30/19

## 2019-05-30 ENCOUNTER — Ambulatory Visit: Payer: Medicare Other | Admitting: Internal Medicine

## 2019-05-31 DIAGNOSIS — M25531 Pain in right wrist: Secondary | ICD-10-CM | POA: Diagnosis not present

## 2019-05-31 DIAGNOSIS — M1811 Unilateral primary osteoarthritis of first carpometacarpal joint, right hand: Secondary | ICD-10-CM | POA: Diagnosis not present

## 2019-06-19 ENCOUNTER — Encounter: Payer: Self-pay | Admitting: Physical Therapy

## 2019-06-19 ENCOUNTER — Ambulatory Visit: Payer: Medicare Other | Attending: Internal Medicine | Admitting: Physical Therapy

## 2019-06-19 ENCOUNTER — Other Ambulatory Visit: Payer: Self-pay

## 2019-06-19 DIAGNOSIS — M1711 Unilateral primary osteoarthritis, right knee: Secondary | ICD-10-CM | POA: Diagnosis not present

## 2019-06-19 DIAGNOSIS — R2681 Unsteadiness on feet: Secondary | ICD-10-CM | POA: Diagnosis not present

## 2019-06-19 DIAGNOSIS — R42 Dizziness and giddiness: Secondary | ICD-10-CM | POA: Insufficient documentation

## 2019-06-19 DIAGNOSIS — H8111 Benign paroxysmal vertigo, right ear: Secondary | ICD-10-CM | POA: Insufficient documentation

## 2019-06-19 DIAGNOSIS — M25562 Pain in left knee: Secondary | ICD-10-CM | POA: Diagnosis not present

## 2019-06-19 NOTE — Therapy (Signed)
[facility] [address] [place], [place], [address] Phone: [phone]   Fax:  [phone]  Physical Therapy Treatment  Patient Details  Name: Lisa Vega MRN: [id number] Date of Birth: 1932-07-26 Referring Provider (PT): Izora Gala, MD    Encounter Date: 06/19/2019   CLINIC OPERATION CHANGES: Outpatient Neuro Rehab is open at lower capacity following universal masking, social distancing, and patient screening.  The patient's COVID risk of complications score is 6.   PT End of Session - 06/19/19 1229    Visit Number  3    Number of Visits  8    Date for PT Re-Evaluation  07/19/19   POC written for 8 weeks   Authorization Type  medicare and federal BCBS/ 10th visit PN note    PT Start Time  0900    PT Stop Time  0944    PT Time Calculation (min)  44 min    Activity Tolerance  Patient tolerated treatment well    Behavior During Therapy  [facility] for tasks assessed/performed       Past Medical History:  Diagnosis Date  . Anxiety   . Arthritis    RA  . BPV (benign positional vertigo) 03/10/2018  . Cancer ([place])    lung ca  . Carotid stenosis, left 09/19/2018  . COPD (chronic obstructive pulmonary disease) ([place])   . Depression   . Dyspnea    with exertion  . GERD (gastroesophageal reflux disease)   . Headache syndrome 03/09/2019  . Hypercholesteremia   . Hypothyroidism   . Memory difficulties 10/28/2015  . Pneumonia     x3  last time 2017  . Pre-diabetes   . Rheumatoid arthritis(714.0)   . Thyroid disease   . Tremor 10/28/2015   Jaw tremor  . Uterine prolapse   . Vitamin D deficiency     Past Surgical History:  Procedure Laterality Date  . ANGIOPLASTY Left 10/10/2018   Procedure: ANGIOPLASTY using 1cm x 14cm Xenosure Patch;  Surgeon: Marty Heck, MD;  Location: [facility];  Service: Vascular;  Laterality: Left;  . APPENDECTOMY    . COLONOSCOPY W/ POLYPECTOMY    . ENDARTERECTOMY Left  10/10/2018   Procedure: ENDARTERECTOMY CAROTID;  Surgeon: Marty Heck, MD;  Location: [facility];  Service: Vascular;  Laterality: Left;  . EYE SURGERY Bilateral    cataract  . FOOT SURGERY Bilateral    arthritis  . Hands  Bilateral    Arthritis  . HEMORRHOID SURGERY    . right lobectomy  10/2004  . TONSILLECTOMY AND ADENOIDECTOMY      There were no vitals filed for this visit.  Subjective Assessment - 06/19/19 0903    Subjective  Patient states that she has had a recoccurence of feeling dizzy that started in the past couple of weeks (previously treated by PT for posterior canal BPPV 04/18/19). Feels dizzy when she is lying on her R side. Lasts 30 seconds. Also feels dizzy when turning back to the center.    Pertinent History  L carotid artery surgery 10/11/2019, rheumatoid arthritis (tolerated epley's in the past without neck issues)    Limitations  --   Resting in bed   Patient Stated Goals  Wants to be able to sleep comfortably. No longer wants to feel dizzy.    Currently in Pain?  No/denies         [facility] PT Assessment - 06/19/19 0908      Assessment   Medical Diagnosis  Vertigo    Referring Provider (PT)  Izora Gala, MD    Onset Date/Surgical Date  --   Reoccurrence from a couple weeks ago (was treated 04/18/19)   Hand Dominance  Right    Prior Therapy  received modified Epley for posterior canal BPPV in PT (04/18/19)      Precautions   Precautions  Fall;Other (comment)   has RA    Precaution Comments  has RA, she reports 4th vertebrate has an issue (do not see imaging in epic)      Restrictions   Weight Bearing Restrictions  No      Balance Screen   Has the patient fallen in the past 6 months  Yes   R [place] 05/06/19. from repotting house flowers, slipped.   How many times?  1    Has the patient had a decrease in activity level because of a fear of falling?   No    Is the patient reluctant to leave their home because of a fear of falling?   No      Home  Environment   Living Environment  Private residence    Living Arrangements  Children   with daughter   Type of [place] to enter    Entrance Stairs-Number of Steps  2    Entrance Stairs-Rails  Can reach both    [place]  One level    [place] bars - tub/shower;Grab bars - toilet;Shower seat - built in      Prior Function   Level of Independence  Independent    Leisure  Likes to [employer]      Cognition   Overall Cognitive Status  Within Functional Limits for tasks assessed         Vestibular Assessment - 06/19/19 0913      Vestibular Assessment   General Observation  Patient ambulates into clinic indep.      Symptom Behavior   Subjective history of current problem  Dizziness when lying down on R side    Type of Dizziness   Spinning    Frequency of Dizziness  --   every night when lying down    Duration of Dizziness  30 seconds    Symptom Nature  Motion provoked    Aggravating Factors  Rolling to right    Relieving Factors  Head stationary    Progression of Symptoms  No change since onset    History of similar episodes  --   2 prior episodes, responded to canolith repositioning     Oculomotor Exam   Oculomotor Alignment  Normal    Ocular ROM  WFLs   WFLs   Spontaneous  Absent    Gaze-induced   Absent    Smooth Pursuits  Intact    Saccades  Intact      Vestibulo-Ocular Reflex   VOR 1 Head Only (x 1 viewing)  Reluctant to perform due to fear of causing dizziness/difficulty following directions from therapist.      Positional Testing   Dix-Hallpike  Dix-Hallpike Right   Modified with pillow under back to limit extension d/t RA   Sidelying Test  Sidelying Right;Sidelying Left    Horizontal Canal Testing  Horizontal Canal Right;Horizontal Canal Left      Dix-Hallpike Right   Dix-Hallpike Right Duration  10   with increased dizziness   Dix-Hallpike Right Symptoms  Upbeat, right rotatory nystagmus  Sidelying  Right   Sidelying Right Duration  10-15 seconds    Sidelying Right Symptoms  --   no noted nystagmus, reports increased dizziness and nausea.      Sidelying Left   Sidelying Left Duration  none    Sidelying Left Symptoms  No nystagmus      Horizontal Canal Right   Horizontal Canal Right Duration  --   Mild dizziness when first rolling x3-5 seconds   Horizontal Canal Right Symptoms  Normal      Horizontal Canal Left   Horizontal Canal Left Duration  --   Mild dizziness when first rolling x3-5 seconds   Horizontal Canal Left Symptoms  Normal                Vestibular Treatment/Exercise - 06/19/19 1239      Vestibular Treatment/Exercise   Vestibular Treatment Provided  Canalith Repositioning    Canalith Repositioning  Epley Manuever Right   Modified       EPLEY MANUEVER RIGHT   Number of Reps   1    Overall Response  Improved Symptoms    Response Details   MODIFIED:  Due to Rheumatoid arthritis, used a pillow and provided support to the neck.  PT ensured extension stopped at 30 degrees (the minimum needed to perform repositioning).              PT Education - 06/19/19 0956    Education Details  Patient educated on clinical findings, POC.    Person(s) Educated  Patient    Methods  Explanation    Comprehension  Verbalized understanding          PT Long Term Goals - 06/19/19 1001      PT LONG TERM GOAL #1   Title  Patient will have negative testing of BPPV indicating resolution of symptoms.    Baseline  Positive for R posterior canal BPPV    Time  4    Period  Weeks    Status  New    Target Date  07/19/19      PT LONG TERM GOAL #2   Title  Patient will subjectively report no dizziness when performing bed mobility and lying on R side.    Baseline  Dizzness while lying on R side (lasts ~30 seconds)    Time  4    Period  Weeks    Status  New    Target Date  07/19/19      PT LONG TERM GOAL #3   Title  Patient will demonstrate final HEP in order to  self manage BPPV.    Time  4    Period  Weeks    Status  New    Target Date  07/19/19            Plan - 06/19/19 1012    Clinical Impression Statement  Patient has had a reoccurrence of symptoms after being treated by PT for R posterior canal BPPV (04/18/19). The following were noted during pt's re-assessment: Positive test with nystagmus during modified (limited neck rotation/extension) R Dix Hallpike, dizziness/nausea in R side lying position, decreased bed mobility, and responded well to Canalith repositioning maneuever with reports of decreased dizziness. Will have patient follow up to ensure resoulition of symptoms and to continue HEP.    Personal Factors and Comorbidities  --    Comorbidities  --    Examination-Activity Limitations  --    Stability/Clinical Decision Making  --    Clinical  Decision Making  --    Rehab Potential  Good    PT Frequency  1x / week    PT Duration  8 weeks    PT Treatment/Interventions  ADLs/Self Care Home Management;Canalith Repostioning;Functional mobility training;Gait training;Balance training;Neuromuscular re-education;Patient/family education;Therapeutic activities;Stair training;Vestibular    PT Next Visit Plan  Re-check canals. Perform modified Epleys if necessary. Initial HEP (VOR x1, review Nestor Lewandowsky). Discuss about sleeping on L side vs. R in order to decrease reoccurrence of BPPV.    Consulted and Agree with Plan of Care  Patient       Patient will benefit from skilled therapeutic intervention in order to improve the following deficits and impairments:  Decreased activity tolerance, Postural dysfunction, Decreased range of motion, Dizziness, Decreased balance  Visit Diagnosis: 1. BPPV (benign paroxysmal positional vertigo), right   2. Unsteadiness on feet   3. Dizziness and giddiness        Problem List Patient Active Problem List   Diagnosis Date Noted  . Headache syndrome 03/09/2019  . Upper respiratory tract infection  01/23/2019  . Carotid stenosis 10/10/2018  . Carotid stenosis, left 09/19/2018  . Benign positional vertigo 03/10/2018  . History of lung cancer 11/29/2017  . Difficulty sleeping 09/22/2017  . Diabetes ([place]) 05/13/2017  . Fatigue 05/12/2017  . CAP (community acquired pneumonia) 05/03/2017  . Orthopnea 01/12/2017  . GERD (gastroesophageal reflux disease) 11/09/2016  . Osteoporosis 05/06/2016  . Depression with anxiety 05/06/2016  . Hiatal hernia 05/06/2016  . Hypothyroidism 05/06/2016  . Tremor 10/28/2015  . Memory difficulties 10/28/2015  . Hoarseness 10/14/2015  . History of vitamin D deficiency 05/02/2015  . Other constipation 01/29/2015  . Abdominal pain, chronic, epigastric 11/01/2014  . 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 ([place]) 05/30/2014  . Diverticulitis of colon without hemorrhage 08/07/2013  . Prolapse of female pelvic organs 06/22/2013  . Neck pain 03/10/2013  . Vaginal atrophy 03/09/2013  . Rectocele 03/09/2013  . Cystocele 03/09/2013  . Female pelvic pain 03/09/2013  . Urinary incontinence 03/09/2013  . COPD (chronic obstructive pulmonary disease) ([place]) 01/16/2011  . CARCINOMA, LUNG, NONSMALL CELL 12/17/2010  . Rheumatoid arthritis ([place]) 12/17/2010  . Dyspnea on exertion 12/17/2010    Arliss Journey, PT, DPT 06/19/2019, 12:40 PM  [facility] [address] [place], [place], [address] Phone: [phone]   Fax:  [phone]  Name: Lisa Vega MRN: [id number] Date of Birth: 20-Feb-1932

## 2019-06-19 NOTE — Therapy (Deleted)
[facility] [address] [place], [place], [address] Phone: [phone]   Fax:  [phone]  Physical Therapy Evaluation  Patient Details  Name: Lisa Vega MRN: [id number] Date of Birth: 07-May-1932 Referring Provider (PT): Arville Care, MD  CLINIC OPERATION CHANGES: Outpatient Neuro Rehab is open at lower capacity following universal masking, social distancing, and patient screening.  The patient's COVID risk of complications score is 6.   Encounter Date: 06/19/2019  PT End of Session - 06/19/19 6962    Visit Number  1    Authorization Type  Medicare Part A/B & BCBC fed employee, VL:75 (combined with PT/OT/speech)/ 10th vist PN    Authorization - Visit Number  3    PT Start Time  0900    PT Stop Time  0943    PT Time Calculation (min)  43 min    Activity Tolerance  Patient tolerated treatment well    Behavior During Therapy  WFL for tasks assessed/performed       Past Medical History:  Diagnosis Date  . Anxiety   . Arthritis    RA  . BPV (benign positional vertigo) 03/10/2018  . Cancer ([place])    lung ca  . Carotid stenosis, left 09/19/2018  . COPD (chronic obstructive pulmonary disease) ([place])   . Depression   . Dyspnea    with exertion  . GERD (gastroesophageal reflux disease)   . Headache syndrome 03/09/2019  . Hypercholesteremia   . Hypothyroidism   . Memory difficulties 10/28/2015  . Pneumonia     x3  last time 2017  . Pre-diabetes   . Rheumatoid arthritis(714.0)   . Thyroid disease   . Tremor 10/28/2015   Jaw tremor  . Uterine prolapse   . Vitamin D deficiency     Past Surgical History:  Procedure Laterality Date  . ANGIOPLASTY Left 10/10/2018   Procedure: ANGIOPLASTY using 1cm x 14cm Xenosure Patch;  Surgeon: Marty Heck, MD;  Location: [facility];  Service: Vascular;  Laterality: Left;  . APPENDECTOMY    . COLONOSCOPY W/ POLYPECTOMY    . ENDARTERECTOMY Left 10/10/2018   Procedure: ENDARTERECTOMY CAROTID;  Surgeon: Marty Heck, MD;  Location: [facility];  Service: Vascular;  Laterality: Left;  . EYE SURGERY Bilateral    cataract  . FOOT SURGERY Bilateral    arthritis  . Hands  Bilateral    Arthritis  . HEMORRHOID SURGERY    . right lobectomy  10/2004  . TONSILLECTOMY AND ADENOIDECTOMY      There were no vitals filed for this visit.   Subjective Assessment - 06/19/19 0903    Subjective  Patient states that she has had a recoccurence of feeling dizzy that started in the past couple of weeks (previously treated by PT for posterior canal BPPV 04/18/19). Feels dizzy when she is lying on her R side. Lasts 30 seconds. Also feels dizzy when turning back to the center.    Pertinent History  L carotid artery surgery 10/11/2019, rheumatoid arthritis (tolerated epley's in the past without neck issues)    Limitations  --   Resting in bed   Patient Stated Goals  Wants to be able to sleep comfortably. No longer wants to feel dizzy.    Currently in Pain?  No/denies         [facility] PT Assessment - 06/19/19 0908      Assessment   Medical Diagnosis  Vertigo    Referring Provider (PT)  Arville Care, MD  Onset Date/Surgical Date  --   Reoccurrence from a couple weeks ago (was treated 04/18/19)   Hand Dominance  Right    Prior Therapy  received modified Epley for posterior canal BPPV in PT (04/18/19)      Precautions   Precautions  Fall;Other (comment)   has RA    Precaution Comments  has RA, she reports 4th vertebrate has an issue (do not see imaging in epic)      Restrictions   Weight Bearing Restrictions  No      Balance Screen   Has the patient fallen in the past 6 months  Yes   R [place] 05/06/19. from repotting house flowers, slipped.   How many times?  1    Has the patient had a decrease in activity level because of a fear of falling?   No    Is the patient reluctant to leave their home because of a fear of falling?   No      Home Environment    Living Environment  Private residence    Living Arrangements  Children   with daughter   Type of [place] to enter    Entrance Stairs-Number of Steps  2    Entrance Stairs-Rails  Can reach both    [place]  One level    [place] bars - tub/shower;Grab bars - toilet;Shower seat - built in      Prior Function   Level of Independence  Independent    Leisure  Likes to [employer]      Cognition   Overall Cognitive Status  Within Functional Limits for tasks assessed           Vestibular Assessment - 06/19/19 0913      Vestibular Assessment   General Observation  Patient ambulates into clinic indep.      Symptom Behavior   Subjective history of current problem  Dizziness when lying down on R side    Type of Dizziness   Spinning    Frequency of Dizziness  --   every night when lying down    Duration of Dizziness  30 seconds    Symptom Nature  Motion provoked    Aggravating Factors  Rolling to right    Relieving Factors  Head stationary    Progression of Symptoms  No change since onset    History of similar episodes  --   2 prior episodes, responded to canolith repositioning     Oculomotor Exam   Oculomotor Alignment  Normal    Ocular ROM  WFLs   WFLs   Spontaneous  Absent    Gaze-induced   Absent    Smooth Pursuits  Intact    Saccades  Intact      Vestibulo-Ocular Reflex   VOR 1 Head Only (x 1 viewing)  Reluctant to perform due to fear of causing dizziness/difficulty following directions from therapist.      Positional Testing   Dix-Hallpike  Dix-Hallpike Right   Modified with pillow under back to limit extension d/t RA   Sidelying Test  Sidelying Right;Sidelying Left    Horizontal Canal Testing  Horizontal Canal Right;Horizontal Canal Left      Dix-Hallpike Right   Dix-Hallpike Right Duration  10   with increased dizziness   Dix-Hallpike Right Symptoms  Upbeat, right rotatory nystagmus      Sidelying Right    Sidelying Right Duration  10-15  seconds    Sidelying Right Symptoms  --   no noted nystagmus, reports increased dizziness and nausea.      Sidelying Left   Sidelying Left Duration  none    Sidelying Left Symptoms  No nystagmus      Horizontal Canal Right   Horizontal Canal Right Duration  --   Mild dizziness when first rolling x3-5 seconds   Horizontal Canal Right Symptoms  Normal      Horizontal Canal Left   Horizontal Canal Left Duration  --   Mild dizziness when first rolling x3-5 seconds   Horizontal Canal Left Symptoms  Normal          Objective measurements completed on examination: See above findings.              PT Education - 06/19/19 0956    Education Details  Patient educated on clinical findings, POC.    Person(s) Educated  Patient    Methods  Explanation    Comprehension  Verbalized understanding          PT Long Term Goals - 06/19/19 1001      PT LONG TERM GOAL #1   Title  Patient will have negative testing of BPPV indicating resolution of symptoms.    Baseline  Positive for R posterior canal BPPV    Time  4    Period  Weeks    Status  New    Target Date  07/19/19      PT LONG TERM GOAL #2   Title  Patient will subjectively report no dizziness when performing bed mobility and lying on R side.    Baseline  Dizzness while lying on R side (lasts ~30 seconds)    Time  4    Period  Weeks    Status  New    Target Date  07/19/19      PT LONG TERM GOAL #3   Title  Patient will demonstrate final HEP in order to self manage BPPV.    Time  4    Period  Weeks    Status  New    Target Date  07/19/19             Plan - 06/19/19 1012    Clinical Impression Statement  Pt is a 85 year old female referred to Neuro OPPT for evaluation of dizziness. Patient has had a reoccurrence of symptoms after being treated by PT for R posterior canal BPPV (04/18/19). Pt's PMH is significant for the following: RA, diabetes, osteoporosis, COPD, carotid  stenosis, h/o BPPV. The following were noted during pt's exam: Positive test with nystagmus during modified (limited neck rotation/extension) R Dix Hallpike, dizziness/nausea in R side lying position, decreased bed mobility. Pt would benefit from skilled PT to address these impairments and functional limitations to maximize functional mobility without dizziness and limit reoccurence of BPPV.    Personal Factors and Comorbidities  Age;Behavior Pattern;Comorbidity 3+;Past/Current Experience    Comorbidities  RA, diabetes, osteoporosis, COPD, carotid stenosis    Examination-Activity Limitations  Bed Mobility    Stability/Clinical Decision Making  Evolving/Moderate complexity    Clinical Decision Making  Moderate    Rehab Potential  Good    PT Frequency  1x / week    PT Duration  8 weeks    PT Treatment/Interventions  ADLs/Self Care Home Management;Canalith Repostioning;Functional mobility training;Gait training;Balance training;Neuromuscular re-education;Patient/family education;Therapeutic activities;Stair training    PT Next Visit Plan  Re-check canals. Perform modified Epleys if necessary.  Initial HEP (VOR x1, review Nestor Lewandowsky). Discuss about sleeping on L side vs. R in order to decrease reoccurrence of BPPV.    Consulted and Agree with Plan of Care  Patient       Patient will benefit from skilled therapeutic intervention in order to improve the following deficits and impairments:  Decreased activity tolerance, Postural dysfunction, Decreased range of motion, Dizziness  Visit Diagnosis: 1. BPPV (benign paroxysmal positional vertigo), right        Problem List Patient Active Problem List   Diagnosis Date Noted  . Headache syndrome 03/09/2019  . Upper respiratory tract infection 01/23/2019  . Carotid stenosis 10/10/2018  . Carotid stenosis, left 09/19/2018  . Benign positional vertigo 03/10/2018  . History of lung cancer 11/29/2017  . Difficulty sleeping 09/22/2017  . Diabetes  ([place]) 05/13/2017  . Fatigue 05/12/2017  . CAP (community acquired pneumonia) 05/03/2017  . Orthopnea 01/12/2017  . GERD (gastroesophageal reflux disease) 11/09/2016  . Osteoporosis 05/06/2016  . Depression with anxiety 05/06/2016  . Hiatal hernia 05/06/2016  . Hypothyroidism 05/06/2016  . Tremor 10/28/2015  . Memory difficulties 10/28/2015  . Hoarseness 10/14/2015  . History of vitamin D deficiency 05/02/2015  . Other constipation 01/29/2015  . Abdominal pain, chronic, epigastric 11/01/2014  . 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 ([place]) 05/30/2014  . Diverticulitis of colon without hemorrhage 08/07/2013  . Prolapse of female pelvic organs 06/22/2013  . Neck pain 03/10/2013  . Vaginal atrophy 03/09/2013  . Rectocele 03/09/2013  . Cystocele 03/09/2013  . Female pelvic pain 03/09/2013  . Urinary incontinence 03/09/2013  . COPD (chronic obstructive pulmonary disease) ([place]) 01/16/2011  . CARCINOMA, LUNG, NONSMALL CELL 12/17/2010  . Rheumatoid arthritis ([place]) 12/17/2010  . Dyspnea on exertion 12/17/2010    Lillia Pauls, DPT 06/19/2019, 10:22 AM  [facility] [address] [place], [place], [address] Phone: [phone]   Fax:  [phone]  Name: Lisa Vega MRN: [id number] Date of Birth: Apr 18, 1932

## 2019-06-26 DIAGNOSIS — M1712 Unilateral primary osteoarthritis, left knee: Secondary | ICD-10-CM | POA: Diagnosis not present

## 2019-06-26 DIAGNOSIS — M5030 Other cervical disc degeneration, unspecified cervical region: Secondary | ICD-10-CM | POA: Diagnosis not present

## 2019-06-28 DIAGNOSIS — Z961 Presence of intraocular lens: Secondary | ICD-10-CM | POA: Diagnosis not present

## 2019-06-28 DIAGNOSIS — H1851 Endothelial corneal dystrophy: Secondary | ICD-10-CM | POA: Diagnosis not present

## 2019-06-28 DIAGNOSIS — H02403 Unspecified ptosis of bilateral eyelids: Secondary | ICD-10-CM | POA: Diagnosis not present

## 2019-06-28 DIAGNOSIS — H26493 Other secondary cataract, bilateral: Secondary | ICD-10-CM | POA: Diagnosis not present

## 2019-06-28 DIAGNOSIS — L718 Other rosacea: Secondary | ICD-10-CM | POA: Diagnosis not present

## 2019-07-03 DIAGNOSIS — M1712 Unilateral primary osteoarthritis, left knee: Secondary | ICD-10-CM | POA: Diagnosis not present

## 2019-07-05 ENCOUNTER — Telehealth: Payer: Self-pay | Admitting: Physical Therapy

## 2019-07-05 ENCOUNTER — Ambulatory Visit: Payer: Medicare Other | Admitting: Physical Therapy

## 2019-07-05 NOTE — Telephone Encounter (Signed)
Discussed with patient on phone about D/C from PT due to cancellation of appointment today after no longer feeling dizzy. Patient would like to keep her chart open and call back in a couple of weeks in case any dizziness returns.

## 2019-07-10 DIAGNOSIS — M1712 Unilateral primary osteoarthritis, left knee: Secondary | ICD-10-CM | POA: Diagnosis not present

## 2019-07-24 DIAGNOSIS — K219 Gastro-esophageal reflux disease without esophagitis: Secondary | ICD-10-CM | POA: Diagnosis not present

## 2019-07-24 DIAGNOSIS — K5901 Slow transit constipation: Secondary | ICD-10-CM | POA: Diagnosis not present

## 2019-07-31 NOTE — Patient Instructions (Addendum)
  Tests ordered today. Your results will be released to [facility] (or called to you) after review.  If any changes need to be made, you will be notified at that same time.   Medications reviewed and updated.  Changes include :   none  Your prescription(s) have been submitted to your pharmacy. Please take as directed and contact our office if you believe you are having problem(s) with the medication(s).    Please followup in 6 months

## 2019-07-31 NOTE — Progress Notes (Signed)
Subjective:    Patient ID: Lisa Vega, female    DOB: 14-Jul-1932, 85 y.o.   MRN: [id number]  HPI The patient is here for follow up.  She is not exercising regularly.    Diabetes: She is controlling her diabetes with diet.  She is compliant with a diabetic diet.  She monitors her sugars and they have been running less than 132.  She is up-to-date with an ophthalmology examination.   Hypothyroidism:  She is taking her medication daily.  She denies any recent changes in energy or weight that are unexplained.   Depression, Anxiety: She is taking her sertraline daily as prescribed. She takes xanax as needed on occasion.  She denies any side effects from the medication. She feels her anxiety and depression are fairly controlled and she is happy with her current dose of medication.  She does have some unhappiness regarding her current living situation, but does not feel medication changes would help that.  GERD:  She is taking her omeprazole daily as prescribed.  She was taking zantac as needed, but knows she should not take it anymore.   She denies any GERD symptoms daily, but has occasional GERD.     Right CVA pain:  She has intermittent pain.  She feels it at least once a week.  It is independent of activity or position.  It does not radiate.  It started a few months ago.  The pain lasts for a while.    She also has RUQ pain.  She has had intermittent right upper quadrant pain for a while.  It is not necessarily related to eating or movement.  There is been no changes in bowel movements.   Medications and allergies reviewed with patient and updated if appropriate.  Patient Active Problem List   Diagnosis Date Noted  . Headache syndrome 03/09/2019  . Upper respiratory tract infection 01/23/2019  . Carotid stenosis 10/10/2018  . Carotid stenosis, left 09/19/2018  . Benign positional vertigo 03/10/2018  . History of lung cancer 11/29/2017  . Difficulty sleeping 09/22/2017  .  Diabetes ([place]) 05/13/2017  . Fatigue 05/12/2017  . CAP (community acquired pneumonia) 05/03/2017  . Orthopnea 01/12/2017  . GERD (gastroesophageal reflux disease) 11/09/2016  . Osteoporosis 05/06/2016  . Depression with anxiety 05/06/2016  . Hiatal hernia 05/06/2016  . Hypothyroidism 05/06/2016  . Tremor 10/28/2015  . Memory difficulties 10/28/2015  . Hoarseness 10/14/2015  . History of vitamin D deficiency 05/02/2015  . Other constipation 01/29/2015  . Abdominal pain, chronic, epigastric 11/01/2014  . 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 ([place]) 05/30/2014  . Diverticulitis of colon without hemorrhage 08/07/2013  . Prolapse of female pelvic organs 06/22/2013  . Neck pain 03/10/2013  . Vaginal atrophy 03/09/2013  . Rectocele 03/09/2013  . Cystocele 03/09/2013  . Female pelvic pain 03/09/2013  . Urinary incontinence 03/09/2013  . COPD (chronic obstructive pulmonary disease) ([place]) 01/16/2011  . CARCINOMA, LUNG, NONSMALL CELL 12/17/2010  . Rheumatoid arthritis ([place]) 12/17/2010  . Dyspnea on exertion 12/17/2010    Current Outpatient Medications on File Prior to Visit  Medication Sig Dispense Refill  . acetaminophen (TYLENOL) 325 MG tablet Take 650 mg by mouth every 6 (six) hours as needed for mild pain.     Marland Kitchen ALPRAZolam (XANAX) 0.25 MG tablet Take 1 tablet (0.25 mg total) by mouth daily as needed. for anxiety 20 tablet 1  . blood glucose meter kit and supplies KIT Dispense based on patient and insurance preference. Use up  to four times daily as directed. (FOR E11.9). 1 each 0  . budesonide-formoterol (SYMBICORT) 160-4.5 MCG/ACT inhaler 2 puffs BID (Patient taking differently: Inhale 2 puffs into the lungs 2 (two) times daily. ) 3 Inhaler 3  . folic acid (FOLVITE) 1 MG tablet Take 1 mg by mouth daily.    Marland Kitchen gabapentin (NEURONTIN) 100 MG capsule TAKE 2 CAPSULES BY MOUTH 3 TIMES A DAY 540 capsule 1  . ibuprofen (ADVIL,MOTRIN) 200 MG tablet Take 200 mg by mouth every 6 (six)  hours as needed for headache or mild pain.    Marland Kitchen levothyroxine (SYNTHROID) 88 MCG tablet Take 1 tablet (88 mcg total) by mouth daily. 90 tablet 0  . linaclotide (LINZESS) 72 MCG capsule Take 72 mcg by mouth daily as needed (constipation).     . meclizine (ANTIVERT) 25 MG tablet Take 0.5-1 tablets (12.5-25 mg total) by mouth 3 (three) times daily as needed for dizziness. 30 tablet 5  . methotrexate 50 MG/2ML injection Inject 17.5 mg into the skin every Sunday. Patient is not taking it Sunday, October 13- patient reported  1  . mupirocin ointment (BACTROBAN) 2 % Place 1 application into the nose 2 (two) times daily. 22 g 1  . NONFORMULARY OR COMPOUNDED ITEM Estradiol .02% 1 ML Prefilled Applicator Sig: apply vaginally twice a week #90 Day Supply with 4 refills 1 each 0  . omeprazole (PRILOSEC) 40 MG capsule Take 40 mg by mouth daily.    [name] DELICA LANCETS 54S MISC USE TO CHECK SUGARS UP TO 4 TIMES A DAY  0  . PROAIR HFA 108 (90 Base) MCG/ACT inhaler USE 1-2 PUFFS EVERY 4-6 HOURS AS NEEDED (Patient taking differently: Inhale 2 puffs into the lungs every 6 (six) hours as needed for wheezing. ) 8.5 g 1  . sertraline (ZOLOFT) 100 MG tablet TAKE 1 TABLET BY MOUTH EVERY DAY (Patient taking differently: Take 50 mg by mouth daily. ) 90 tablet 1  . Tiotropium Bromide Monohydrate (SPIRIVA RESPIMAT) 2.5 MCG/ACT AERS Inhale 2 puffs into the lungs daily. (Patient taking differently: Inhale 1 puff into the lungs daily as needed (wheezing). ) 1 Inhaler 0  . traMADol (ULTRAM) 50 MG tablet Take 50 mg by mouth 3 (three) times daily as needed for moderate pain.   0  . triamcinolone cream (KENALOG) 0.1 % Apply 1 application topically daily as needed. Thin layer on vulva as needed (Patient taking differently: Apply 1 application topically daily as needed (vulva). ) 30 g 3   No current facility-administered medications on file prior to visit.     Past Medical History:  Diagnosis Date  . Anxiety   . Arthritis     RA  . BPV (benign positional vertigo) 03/10/2018  . Cancer ([place])    lung ca  . Carotid stenosis, left 09/19/2018  . COPD (chronic obstructive pulmonary disease) ([place])   . Depression   . Dyspnea    with exertion  . GERD (gastroesophageal reflux disease)   . Headache syndrome 03/09/2019  . Hypercholesteremia   . Hypothyroidism   . Memory difficulties 10/28/2015  . Pneumonia     x3  last time 2017  . Pre-diabetes   . Rheumatoid arthritis(714.0)   . Thyroid disease   . Tremor 10/28/2015   Jaw tremor  . Uterine prolapse   . Vitamin D deficiency     Past Surgical History:  Procedure Laterality Date  . ANGIOPLASTY Left 10/10/2018   Procedure: ANGIOPLASTY using 1cm x 14cm Xenosure Patch;  Surgeon: Marty Heck, MD;  Location: [facility];  Service: Vascular;  Laterality: Left;  . APPENDECTOMY    . COLONOSCOPY W/ POLYPECTOMY    . ENDARTERECTOMY Left 10/10/2018   Procedure: ENDARTERECTOMY CAROTID;  Surgeon: Marty Heck, MD;  Location: [facility];  Service: Vascular;  Laterality: Left;  . EYE SURGERY Bilateral    cataract  . FOOT SURGERY Bilateral    arthritis  . Hands  Bilateral    Arthritis  . HEMORRHOID SURGERY    . right lobectomy  10/2004  . TONSILLECTOMY AND ADENOIDECTOMY      Social History   Socioeconomic History  . Marital status: Widowed    Spouse name: Not on file  . Number of children: 3  . Years of education: LPN  . Highest education level: Not on file  Occupational History  . Occupation: retired    Fish farm manager: RETIRED    Comment: LPN at [employer]  . Financial resource strain: Not hard at all  . Food insecurity    Worry: Never true    Inability: Never true  . Transportation needs    Medical: No    Non-medical: No  Tobacco Use  . Smoking status: Former Smoker    Packs/day: 1.00    Years: 20.00    Pack years: 20.00    Types: Cigarettes    Quit date: 11/10/2004    Years since quitting: 14.7  . Smokeless tobacco:  Never Used  Substance and Sexual Activity  . Alcohol use: Yes    Alcohol/week: 1.0 standard drinks    Types: 1 Glasses of wine per week    Comment: socially  . Drug use: No  . Sexual activity: Not Currently    Comment: 1st intercourse- 19, partners- 5,   Lifestyle  . Physical activity    Days per week: 3 days    Minutes per session: 40 min  . Stress: To some extent  Relationships  . Social connections    Talks on phone: More than three times a week    Gets together: More than three times a week    Attends religious service: More than 4 times per year    Active member of club or organization: Yes    Attends meetings of clubs or organizations: More than 4 times per year    Relationship status: Widowed  Other Topics Concern  . Not on file  Social History Narrative   Patient drinks about 3 cups of coffee daily.   Patient is right handed.     Family History  Problem Relation Age of Onset  . Emphysema Mother   . Asthma Mother   . Lung cancer Father   . Pancreatic cancer Father   . Bone cancer Father   . Heart disease Brother        x1  . Heart disease Sister        x2  . Stroke Maternal Grandfather     Review of Systems  Constitutional: Negative for chills and fever.  Respiratory: Positive for cough, shortness of breath and wheezing (at her baseline).   Cardiovascular: Negative for chest pain, palpitations and leg swelling.  Gastrointestinal: Positive for abdominal pain (intermittent RUQ pain), constipation and nausea (occasional). Negative for blood in stool and diarrhea.       GERD occ  Genitourinary: Negative for dysuria and hematuria.  Neurological: Positive for dizziness (3 mo of BPPV - improved by PT). Negative for headaches.  Objective:   Vitals:   08/01/19 1110  BP: (!) 142/92  Pulse: 62  Resp: 16  Temp: 98.6 F (37 C)  SpO2: 95%   BP Readings from Last 3 Encounters:  08/01/19 (!) 142/92  05/10/19 130/74  05/06/19 (!) 190/70   Wt Readings  from Last 3 Encounters:  08/01/19 135 lb (61.2 kg)  05/06/19 130 lb (59 kg)  03/09/19 126 lb 5 oz (57.3 kg)   Body mass index is 23.17 kg/m.   Physical Exam    Constitutional: Appears well-developed and well-nourished. No distress.  HENT:  Head: Normocephalic and atraumatic.  Neck: Neck supple. No tracheal deviation present. No thyromegaly present.  No cervical lymphadenopathy Cardiovascular: Normal rate, regular rhythm and normal heart sounds.   No murmur heard. No carotid bruit .  No edema Pulmonary/Chest: Effort normal and breath sounds normal. No respiratory distress. No has no wheezes. No rales. Abdomen: Soft, nondistended, slight discomfort throughout abdomen-nonfocal, no rebound or guarding Musculoskeletal: No tenderness with palpation of right CVA region, slight tenderness with palpation of right SI joint Skin: Skin is warm and dry. Not diaphoretic.  Psychiatric: Normal mood and affect. Behavior is normal.      Assessment & Plan:    See Problem List for Assessment and Plan of chronic medical problems.

## 2019-08-01 ENCOUNTER — Encounter: Payer: Self-pay | Admitting: Internal Medicine

## 2019-08-01 ENCOUNTER — Other Ambulatory Visit (INDEPENDENT_AMBULATORY_CARE_PROVIDER_SITE_OTHER): Payer: Medicare Other

## 2019-08-01 ENCOUNTER — Other Ambulatory Visit: Payer: Self-pay

## 2019-08-01 ENCOUNTER — Ambulatory Visit (INDEPENDENT_AMBULATORY_CARE_PROVIDER_SITE_OTHER): Payer: Medicare Other | Admitting: Internal Medicine

## 2019-08-01 VITALS — BP 142/92 | HR 62 | Temp 98.6°F | Resp 16 | Ht 64.0 in | Wt 135.0 lb

## 2019-08-01 DIAGNOSIS — E038 Other specified hypothyroidism: Secondary | ICD-10-CM

## 2019-08-01 DIAGNOSIS — E119 Type 2 diabetes mellitus without complications: Secondary | ICD-10-CM | POA: Diagnosis not present

## 2019-08-01 DIAGNOSIS — F418 Other specified anxiety disorders: Secondary | ICD-10-CM | POA: Diagnosis not present

## 2019-08-01 DIAGNOSIS — R109 Unspecified abdominal pain: Secondary | ICD-10-CM | POA: Insufficient documentation

## 2019-08-01 DIAGNOSIS — R1011 Right upper quadrant pain: Secondary | ICD-10-CM | POA: Diagnosis not present

## 2019-08-01 DIAGNOSIS — K219 Gastro-esophageal reflux disease without esophagitis: Secondary | ICD-10-CM

## 2019-08-01 DIAGNOSIS — R10A1 Flank pain, right side: Secondary | ICD-10-CM

## 2019-08-01 LAB — CBC WITH DIFFERENTIAL/PLATELET
Basophils Absolute: 0.1 10*3/uL (ref 0.0–0.1)
Basophils Relative: 1 % (ref 0.0–3.0)
Eosinophils Absolute: 0.1 10*3/uL (ref 0.0–0.7)
Eosinophils Relative: 2.8 % (ref 0.0–5.0)
HCT: 37.6 % (ref 36.0–46.0)
Hemoglobin: 12.4 g/dL (ref 12.0–15.0)
Lymphocytes Relative: 45.5 % (ref 12.0–46.0)
Lymphs Abs: 2.4 10*3/uL (ref 0.7–4.0)
MCHC: 33 g/dL (ref 30.0–36.0)
MCV: 102.3 fl — ABNORMAL HIGH (ref 78.0–100.0)
Monocytes Absolute: 0.5 10*3/uL (ref 0.1–1.0)
Monocytes Relative: 10.3 % (ref 3.0–12.0)
Neutro Abs: 2.1 10*3/uL (ref 1.4–7.7)
Neutrophils Relative %: 40.4 % — ABNORMAL LOW (ref 43.0–77.0)
Platelets: 254 10*3/uL (ref 150.0–400.0)
RBC: 3.68 Mil/uL — ABNORMAL LOW (ref 3.87–5.11)
RDW: 15.9 % — ABNORMAL HIGH (ref 11.5–15.5)
WBC: 5.3 10*3/uL (ref 4.0–10.5)

## 2019-08-01 LAB — COMPREHENSIVE METABOLIC PANEL
ALT: 11 U/L (ref 0–35)
AST: 19 U/L (ref 0–37)
Albumin: 4 g/dL (ref 3.5–5.2)
Alkaline Phosphatase: 85 U/L (ref 39–117)
BUN: 12 mg/dL (ref 6–23)
CO2: 32 mEq/L (ref 19–32)
Calcium: 9.7 mg/dL (ref 8.4–10.5)
Chloride: 102 mEq/L (ref 96–112)
Creatinine, Ser: 0.83 mg/dL (ref 0.40–1.20)
GFR: 65.36 mL/min (ref >60.00)
Glucose, Bld: 98 mg/dL (ref 70–99)
Potassium: 4.7 mEq/L (ref 3.5–5.1)
Sodium: 139 mEq/L (ref 135–145)
Total Bilirubin: 0.5 mg/dL (ref 0.2–1.2)
Total Protein: 7.3 g/dL (ref 6.0–8.3)

## 2019-08-01 LAB — LIPID PANEL
Cholesterol: 221 mg/dL — ABNORMAL HIGH (ref 0–200)
HDL: 71.3 mg/dL (ref >39.00)
LDL Cholesterol: 126 mg/dL — ABNORMAL HIGH (ref 0–99)
NonHDL: 149.33
Total CHOL/HDL Ratio: 3
Triglycerides: 117 mg/dL (ref 0.0–149.0)
VLDL: 23.4 mg/dL (ref 0.0–40.0)

## 2019-08-01 LAB — TSH: TSH: 0.55 u[IU]/mL (ref 0.35–4.50)

## 2019-08-01 LAB — HEMOGLOBIN A1C: Hgb A1c MFr Bld: 5.6 % (ref 4.6–6.5)

## 2019-08-01 NOTE — Assessment & Plan Note (Signed)
Chronic, intermittent [place] in the past was normal Discussed getting another [place] to evaluate - she deferred and will just monitor for now, but will let me know if this continues so we can pursue imaging

## 2019-08-01 NOTE — Assessment & Plan Note (Signed)
Taking omeprazole daily Was taking zantac prn -- will change to pepcid - use as needed

## 2019-08-01 NOTE — Assessment & Plan Note (Signed)
Overall controlled continue sertraline at current dose Discussed mild dysthymia from living with her family - may consider moving to a assisted living facility

## 2019-08-01 NOTE — Assessment & Plan Note (Signed)
Clinically euthyroid Check tsh  Titrate med dose if needed  

## 2019-08-01 NOTE — Assessment & Plan Note (Addendum)
Diet controlled  Check a1c

## 2019-08-01 NOTE — Assessment & Plan Note (Signed)
Started one week ago - no urinary symptoms ? MSK vs kidney related Discussed [place] - she deferred for now -- if pain persists she will let me know so we can pursue imaging

## 2019-08-02 ENCOUNTER — Other Ambulatory Visit: Payer: Self-pay

## 2019-08-03 ENCOUNTER — Encounter: Payer: Self-pay | Admitting: Gynecology

## 2019-08-03 ENCOUNTER — Ambulatory Visit (INDEPENDENT_AMBULATORY_CARE_PROVIDER_SITE_OTHER): Payer: Medicare Other | Admitting: Gynecology

## 2019-08-03 ENCOUNTER — Other Ambulatory Visit: Payer: Self-pay

## 2019-08-03 VITALS — BP 134/86

## 2019-08-03 DIAGNOSIS — R300 Dysuria: Secondary | ICD-10-CM | POA: Diagnosis not present

## 2019-08-03 DIAGNOSIS — N3000 Acute cystitis without hematuria: Secondary | ICD-10-CM | POA: Diagnosis not present

## 2019-08-03 DIAGNOSIS — I6523 Occlusion and stenosis of bilateral carotid arteries: Secondary | ICD-10-CM

## 2019-08-03 DIAGNOSIS — Z4689 Encounter for fitting and adjustment of other specified devices: Secondary | ICD-10-CM

## 2019-08-03 MED ORDER — FLUCONAZOLE 150 MG PO TABS: 150.0000 mg | ORAL_TABLET | Freq: Once | ORAL | 0 refills | Status: AC

## 2019-08-03 MED ORDER — NITROFURANTOIN MONOHYD MACRO 100 MG PO CAPS: 100.0000 mg | ORAL_CAPSULE | Freq: Two times a day (BID) | ORAL | 0 refills | Status: DC

## 2019-08-03 NOTE — Patient Instructions (Signed)
Take the antibiotic twice daily for 7 days.  Follow-up if your symptoms persist.

## 2019-08-03 NOTE — Progress Notes (Signed)
    Lisa Vega Jun 23, 1932 [id number]        85 y.o.  [id number] presents feeling that her pessary is out of place.  Having some dysuria and low back pain.  No frequency, urgency, fever or chills.  Has been going on for approximately 3 weeks.  Past medical history,surgical history, problem list, medications, allergies, family history and social history were all reviewed and documented in the EPIC chart.  Directed ROS with pertinent positives and negatives documented in the history of present illness/assessment and plan.  Exam: Copywriter, advertising Vitals:   08/03/19 1028  BP: 134/86   General appearance:  Normal Spine straight without CVA tenderness Abdomen soft nontender without masses guarding rebound Pelvic external BUS vagina with atrophic changes.  Pessary palpates in appropriate location.  Pessary was removed.  Vaginal mucosa without irritation or erosion.  Bimanual without masses or tenderness.  Pessary was cleansed and replaced without difficulty.  Assessment/Plan:  85 y.o. [id number] with history and exam as above.  Urine analysis is consistent with UTI.  Will treat with Macrobid 100 mg twice daily x7 days.  Options to leave the pessary out for now versus replacing it discussed.  Patient would prefer having it replaced as we did above.  She will follow-up next week if she still feels uncomfortable with it in place.  We discussed that more than likely the UTI has made her have discomfort and hopefully this will resolve with treatment.  We also discussed her mild elevated blood pressure which she is going to monitor and follow-up with her primary provider if it continues elevated.    Anastasio Auerbach MD, 11:17 AM 08/03/2019

## 2019-08-05 LAB — URINALYSIS, COMPLETE W/RFL CULTURE
Bilirubin Urine: NEGATIVE
Glucose, UA: NEGATIVE
Hyaline Cast: NONE SEEN /LPF
Ketones, ur: NEGATIVE
Nitrites, Initial: NEGATIVE
Protein, ur: NEGATIVE
Specific Gravity, Urine: 1.01 (ref 1.001–1.03)
pH: 5.5 (ref 5.0–8.0)

## 2019-08-05 LAB — URINE CULTURE
MICRO NUMBER:: 748163
Result:: NO GROWTH
SPECIMEN QUALITY:: ADEQUATE

## 2019-08-05 LAB — CULTURE INDICATED

## 2019-08-07 DIAGNOSIS — M5030 Other cervical disc degeneration, unspecified cervical region: Secondary | ICD-10-CM | POA: Diagnosis not present

## 2019-08-07 DIAGNOSIS — M1712 Unilateral primary osteoarthritis, left knee: Secondary | ICD-10-CM | POA: Diagnosis not present

## 2019-08-07 DIAGNOSIS — M25511 Pain in right shoulder: Secondary | ICD-10-CM | POA: Diagnosis not present

## 2019-08-08 ENCOUNTER — Inpatient Hospital Stay (HOSPITAL_COMMUNITY): Admission: RE | Admit: 2019-08-08 | Payer: Medicare Other | Source: Ambulatory Visit

## 2019-08-08 ENCOUNTER — Ambulatory Visit: Payer: Medicare Other | Admitting: Vascular Surgery

## 2019-08-10 ENCOUNTER — Ambulatory Visit: Payer: Medicare Other | Admitting: Gynecology

## 2019-08-20 ENCOUNTER — Other Ambulatory Visit: Payer: Self-pay | Admitting: Internal Medicine

## 2019-08-24 ENCOUNTER — Other Ambulatory Visit: Payer: Self-pay | Admitting: Internal Medicine

## 2019-08-24 ENCOUNTER — Other Ambulatory Visit: Payer: Self-pay | Admitting: Pulmonary Disease

## 2019-09-11 ENCOUNTER — Ambulatory Visit: Payer: Medicare Other | Admitting: Neurology

## 2019-09-19 ENCOUNTER — Encounter: Payer: Self-pay | Admitting: Vascular Surgery

## 2019-09-19 ENCOUNTER — Ambulatory Visit (HOSPITAL_COMMUNITY)
Admission: RE | Admit: 2019-09-19 | Discharge: 2019-09-19 | Disposition: A | Discharge status: Home / Self Care | Payer: Medicare Other | Source: Ambulatory Visit | Attending: Vascular Surgery | Admitting: Vascular Surgery

## 2019-09-19 ENCOUNTER — Other Ambulatory Visit: Payer: Self-pay

## 2019-09-19 ENCOUNTER — Ambulatory Visit (INDEPENDENT_AMBULATORY_CARE_PROVIDER_SITE_OTHER): Payer: Medicare Other | Admitting: Vascular Surgery

## 2019-09-19 VITALS — BP 143/73 | HR 60 | Temp 98.3°F | Resp 14 | Ht 60.0 in | Wt 132.0 lb

## 2019-09-19 DIAGNOSIS — I6522 Occlusion and stenosis of left carotid artery: Secondary | ICD-10-CM | POA: Diagnosis not present

## 2019-09-19 DIAGNOSIS — I6523 Occlusion and stenosis of bilateral carotid arteries: Secondary | ICD-10-CM | POA: Diagnosis not present

## 2019-09-19 NOTE — Progress Notes (Signed)
Patient name: Lisa Vega MRN: [id number] DOB: [birth year] Sex: female  REASON FOR VISIT: Surveillance for carotid disease  HPI: Lisa Vega is a 85 y.o. female who presents for six-month follow-up and ongoing surveillance of her carotid disease.  She had a left carotid endarterectomy with patch angioplasty on 09/30/2018 last year for symptomatic high-grade stenosis.  She was on Plavix 1 month postop and then this was discontinued and she remains on aspirin.  She has had no new neurologic events since we saw her last year.  She denies any focal weakness or numbness in her arm or leg or vision loss in one eye.  Overall she feels she is doing very well.  She does have some residual numbness under her left chin.  Past Medical History:  Diagnosis Date  . Anxiety   . Arthritis    RA  . BPV (benign positional vertigo) 03/10/2018  . Cancer ([place])    lung ca  . Carotid stenosis, left 09/19/2018  . COPD (chronic obstructive pulmonary disease) ([place])   . Depression   . Dyspnea    with exertion  . GERD (gastroesophageal reflux disease)   . Headache syndrome 03/09/2019  . Hypercholesteremia   . Hypothyroidism   . Memory difficulties 10/28/2015  . Pneumonia     x3  last time 2017  . Pre-diabetes   . Rheumatoid arthritis(714.0)   . Thyroid disease   . Tremor 10/28/2015   Jaw tremor  . Uterine prolapse   . Vitamin D deficiency     Past Surgical History:  Procedure Laterality Date  . ANGIOPLASTY Left 10/10/2018   Procedure: ANGIOPLASTY using 1cm x 14cm Xenosure Patch;  Surgeon: Marty Heck, MD;  Location: [facility];  Service: Vascular;  Laterality: Left;  . APPENDECTOMY    . COLONOSCOPY W/ POLYPECTOMY    . ENDARTERECTOMY Left 10/10/2018   Procedure: ENDARTERECTOMY CAROTID;  Surgeon: Marty Heck, MD;  Location: [facility];  Service: Vascular;  Laterality: Left;  . EYE SURGERY Bilateral    cataract  . FOOT SURGERY Bilateral    arthritis  . Hands  Bilateral    Arthritis   . HEMORRHOID SURGERY    . right lobectomy  10/2004  . TONSILLECTOMY AND ADENOIDECTOMY      Family History  Problem Relation Age of Onset  . Emphysema Mother   . Asthma Mother   . Lung cancer Father   . Pancreatic cancer Father   . Bone cancer Father   . Heart disease Brother        x1  . Heart disease Sister        x2  . Stroke Maternal Grandfather     SOCIAL HISTORY: Social History   Tobacco Use  . Smoking status: Former Smoker    Packs/day: 1.00    Years: 20.00    Pack years: 20.00    Types: Cigarettes    Quit date: 11/10/2004    Years since quitting: 14.8  . Smokeless tobacco: Never Used  Substance Use Topics  . Alcohol use: Yes    Alcohol/week: 1.0 standard drinks    Types: 1 Glasses of wine per week    Comment: socially    Allergies  Allergen Reactions  . Codeine     REACTION: hallucinations  . Demerol   . Meperidine Hcl     REACTION: severe GI upset    Current Outpatient Medications  Medication Sig Dispense Refill  . acetaminophen (TYLENOL) 325 MG tablet Take 650 mg  by mouth every 6 (six) hours as needed for mild pain.     Marland Kitchen ALPRAZolam (XANAX) 0.25 MG tablet Take 1 tablet (0.25 mg total) by mouth daily as needed. for anxiety 20 tablet 1  . blood glucose meter kit and supplies KIT Dispense based on patient and insurance preference. Use up to four times daily as directed. (FOR E11.9). 1 each 0  . budesonide-formoterol (SYMBICORT) 160-4.5 MCG/ACT inhaler USE 2 INHALATIONS ORALLY   TWICE DAILY 95.0 g 0  . folic acid (FOLVITE) 1 MG tablet Take 1 mg by mouth daily.    Marland Kitchen gabapentin (NEURONTIN) 100 MG capsule TAKE 2 CAPSULES BY MOUTH 3 TIMES A DAY 540 capsule 1  . ibuprofen (ADVIL,MOTRIN) 200 MG tablet Take 200 mg by mouth every 6 (six) hours as needed for headache or mild pain.    Marland Kitchen linaclotide (LINZESS) 72 MCG capsule Take 72 mcg by mouth daily as needed (constipation).     . meclizine (ANTIVERT) 25 MG tablet Take 0.5-1 tablets (12.5-25 mg total) by mouth 3  (three) times daily as needed for dizziness. 30 tablet 5  . methotrexate 50 MG/2ML injection Inject 17.5 mg into the skin every Sunday. Patient is not taking it Sunday, October 13- patient reported  1  . mupirocin ointment (BACTROBAN) 2 % Place 1 application into the nose 2 (two) times daily. 22 g 1  . nitrofurantoin, macrocrystal-monohydrate, (MACROBID) 100 MG capsule Take 1 capsule (100 mg total) by mouth 2 (two) times daily. For 7 days 14 capsule 0  . NONFORMULARY OR COMPOUNDED ITEM Estradiol .02% 1 ML Prefilled Applicator Sig: apply vaginally twice a week #90 Day Supply with 4 refills 1 each 0  . omeprazole (PRILOSEC) 40 MG capsule Take 40 mg by mouth daily.    [name] DELICA LANCETS 93O MISC USE TO CHECK SUGARS UP TO 4 TIMES A DAY  0  . PROAIR HFA 108 (90 Base) MCG/ACT inhaler USE 1-2 PUFFS EVERY 4-6 HOURS AS NEEDED (Patient taking differently: Inhale 2 puffs into the lungs every 6 (six) hours as needed for wheezing. ) 8.5 g 1  . sertraline (ZOLOFT) 100 MG tablet TAKE 1 TABLET BY MOUTH EVERY DAY 90 tablet 1  . SYNTHROID 88 MCG tablet TAKE 1 TABLET DAILY 90 tablet 1  . Tiotropium Bromide Monohydrate (SPIRIVA RESPIMAT) 2.5 MCG/ACT AERS Inhale 2 puffs into the lungs daily. (Patient taking differently: Inhale 1 puff into the lungs daily as needed (wheezing). ) 1 Inhaler 0  . traMADol (ULTRAM) 50 MG tablet Take 50 mg by mouth 3 (three) times daily as needed for moderate pain.   0  . triamcinolone cream (KENALOG) 0.1 % Apply 1 application topically daily as needed. Thin layer on vulva as needed (Patient taking differently: Apply 1 application topically daily as needed (vulva). ) 30 g 3   No current facility-administered medications for this visit.     REVIEW OF SYSTEMS:  _0  denotes positive finding, _1  denotes negative finding Cardiac  Comments:  Chest pain or chest pressure:    Shortness of breath upon exertion:    Short of breath when lying flat:    Irregular heart rhythm:         Vascular    Pain in calf, thigh, or hip brought on by ambulation:    Pain in feet at night that wakes you up from your sleep:     Blood clot in your veins:    Leg swelling:  Pulmonary    Oxygen at home:    Productive cough:     Wheezing:         Neurologic    Sudden weakness in arms or legs:     Sudden numbness in arms or legs:     Sudden onset of difficulty speaking or slurred speech:    Temporary loss of vision in one eye:     Problems with dizziness:         Gastrointestinal    Blood in stool:     Vomited blood:         Genitourinary    Burning when urinating:     Blood in urine:        Psychiatric    Major depression:         Hematologic    Bleeding problems:    Problems with blood clotting too easily:        Skin    Rashes or ulcers:        Constitutional    Fever or chills:      PHYSICAL EXAM: Vitals:   09/19/19 1456 09/19/19 1500  BP: (!) 143/75 (!) 143/73  Pulse: 60 60  Resp: 14   Temp: 98.3 F (36.8 C)   TempSrc: Temporal   SpO2: 98%   Weight: 132 lb (59.9 kg)   Height: 5' (1.524 m)     GENERAL: The patient is a well-nourished female, in no acute distress. The vital signs are documented above. CARDIAC: There is a regular rate and rhythm.  VASCULAR:  Left neck incision well healed Neuro: CN II-XII grossly intact Right and left upper extremity strength 5/5 Right and left lower extremity strength 5/5  DATA:   I reviewed her carotid duplex and the left carotid endarterectomy site is widely patent with no evidence of restenosis.  Her right ICA only has minimal disease.  Assessment/Plan:  85 year old female status post left carotid endarterectomy on 10/10/2018 for high-grade symptomatic stenosis.  On duplex today her endarterectomy site is widely patent.  She has done very well and has had no new neurologic symptoms since her endarterectomy.  She has no significant contralateral disease.  Discussed the importance of continuing aspirin.   I will see her back in 1 year with another carotid duplex for ongoing surveillance.  Look forward to taking care of her.   Marty Heck, MD [facility] Office: [phone] Pager: [phone]

## 2019-09-27 ENCOUNTER — Other Ambulatory Visit: Payer: Self-pay

## 2019-09-28 ENCOUNTER — Ambulatory Visit (INDEPENDENT_AMBULATORY_CARE_PROVIDER_SITE_OTHER): Payer: Medicare Other | Admitting: Gynecology

## 2019-09-28 ENCOUNTER — Encounter: Payer: Self-pay | Admitting: Gynecology

## 2019-09-28 VITALS — BP 122/74

## 2019-09-28 DIAGNOSIS — I6523 Occlusion and stenosis of bilateral carotid arteries: Secondary | ICD-10-CM | POA: Diagnosis not present

## 2019-09-28 DIAGNOSIS — R300 Dysuria: Secondary | ICD-10-CM | POA: Diagnosis not present

## 2019-09-28 DIAGNOSIS — R1032 Left lower quadrant pain: Secondary | ICD-10-CM | POA: Diagnosis not present

## 2019-09-28 DIAGNOSIS — N898 Other specified noninflammatory disorders of vagina: Secondary | ICD-10-CM

## 2019-09-28 DIAGNOSIS — Z4689 Encounter for fitting and adjustment of other specified devices: Secondary | ICD-10-CM

## 2019-09-28 LAB — WET PREP FOR TRICH, YEAST, CLUE

## 2019-09-28 MED ORDER — FLUCONAZOLE 150 MG PO TABS: 150.0000 mg | ORAL_TABLET | Freq: Once | ORAL | 0 refills | Status: AC

## 2019-09-28 NOTE — Progress Notes (Signed)
    Lisa Vega 03/01/1932 [id number]        85 y.o.  [id number] presents with a history of pelvic relaxation with uterine prolapse, cystocele and rectocele.  Using ring pessary.  Having some left lower quadrant discomfort this past week that she feel may be due to the pessary pushing.  She is having issues with constipation and does not feel she is completely evacuating with the pessary in place.  Having some mild dysuria externally with voiding.  No frequency urgency low back pain fever or chills.  Would like to have her pessary removed and left out for now.  Past medical history,surgical history, problem list, medications, allergies, family history and social history were all reviewed and documented in the EPIC chart.  Directed ROS with pertinent positives and negatives documented in the history of present illness/assessment and plan.  Exam: [name] assistant Vitals:   09/28/19 1419  BP: 122/74   General appearance:  Normal Abdomen soft nontender without masses guarding rebound Pelvic external BUS vagina with atrophic changes.  Ring pessary removed.  Thick white discharge noted.  Cervix flush with the upper vagina.  Uterus small midline mobile nontender.  Adnexa without masses or tenderness.   Assessment/Plan:  85 y.o. [id number] with pelvic relaxation using pessary.  Having some left lower quadrant discomfort.  Exam is normal.  Is having some constipation.  Wet prep is positive for yeast.  Will treat with Diflucan 150 mg x 1 dose.  She asked for a few extra just in case she has a recurrence and I gave her a total of 3.  Will leave the pessary out for now at her request.  Assuming her left-sided pain completely resolves then will follow.  If her left-sided pain continues she will call and we will start with an ultrasound to rule out GYN pathology.  Ultimate possible referral to GI also discussed.  She actually has an appointment to see me in November and will follow-up for that regardless.     Anastasio Auerbach MD, 2:30 PM 09/28/2019

## 2019-09-28 NOTE — Patient Instructions (Signed)
Take the one Diflucan pill for the vaginal yeast infection.  Call if the left-sided pain continues.  Follow-up at your already scheduled November appointment.

## 2019-09-30 LAB — URINALYSIS, COMPLETE W/RFL CULTURE
Bilirubin Urine: NEGATIVE
Glucose, UA: NEGATIVE
Hyaline Cast: NONE SEEN /LPF
Ketones, ur: NEGATIVE
Nitrites, Initial: NEGATIVE
Protein, ur: NEGATIVE
Specific Gravity, Urine: 1.01 (ref 1.001–1.03)
pH: 6 (ref 5.0–8.0)

## 2019-09-30 LAB — URINE CULTURE
MICRO NUMBER:: 943836
SPECIMEN QUALITY:: ADEQUATE

## 2019-09-30 LAB — CULTURE INDICATED

## 2019-10-02 DIAGNOSIS — L0202 Furuncle of face: Secondary | ICD-10-CM | POA: Diagnosis not present

## 2019-10-02 DIAGNOSIS — L728 Other follicular cysts of the skin and subcutaneous tissue: Secondary | ICD-10-CM | POA: Diagnosis not present

## 2019-10-02 DIAGNOSIS — L820 Inflamed seborrheic keratosis: Secondary | ICD-10-CM | POA: Diagnosis not present

## 2019-10-02 DIAGNOSIS — B9689 Other specified bacterial agents as the cause of diseases classified elsewhere: Secondary | ICD-10-CM | POA: Diagnosis not present

## 2019-10-03 ENCOUNTER — Other Ambulatory Visit: Payer: Self-pay

## 2019-10-03 ENCOUNTER — Encounter: Payer: Self-pay | Admitting: Pulmonary Disease

## 2019-10-03 ENCOUNTER — Ambulatory Visit (INDEPENDENT_AMBULATORY_CARE_PROVIDER_SITE_OTHER): Payer: Medicare Other

## 2019-10-03 ENCOUNTER — Ambulatory Visit (INDEPENDENT_AMBULATORY_CARE_PROVIDER_SITE_OTHER): Payer: Medicare Other | Admitting: Pulmonary Disease

## 2019-10-03 VITALS — BP 112/68 | HR 62 | Temp 98.1°F | Ht 60.0 in | Wt 135.6 lb

## 2019-10-03 DIAGNOSIS — J449 Chronic obstructive pulmonary disease, unspecified: Secondary | ICD-10-CM | POA: Diagnosis not present

## 2019-10-03 MED ORDER — ALBUTEROL SULFATE HFA 108 (90 BASE) MCG/ACT IN AERS: INHALATION_SPRAY | RESPIRATORY_TRACT | 5 refills | Status: DC

## 2019-10-03 NOTE — Progress Notes (Signed)
Lisa Vega    [id number]    February 01, 1932  Primary Care Physician:Burns, [name], MD  Referring Physician: Binnie Rail, MD [place],  [place] [address]  Chief complaint: Patient with a history of COPD  HPI:  She has had no recent exacerbations No recent ED visits  She is compliant with Symbicort, Spiriva and pro-air which she uses rarely  History of rheumatoid arthritis for which is not been on methotrexate chronically  Breathing has been relatively stable She is not having to restrict activities  She does not perform any graded exercises on a regular basis, tries to walk regularly    Outpatient Encounter Medications as of 10/03/2019  Medication Sig  . acetaminophen (TYLENOL) 325 MG tablet Take 650 mg by mouth every 6 (six) hours as needed for mild pain.   Marland Kitchen albuterol (PROAIR HFA) 108 (90 Base) MCG/ACT inhaler USE 1-2 PUFFS EVERY 4-6 HOURS AS NEEDED  . ALPRAZolam (XANAX) 0.25 MG tablet Take 1 tablet (0.25 mg total) by mouth daily as needed. for anxiety  . blood glucose meter kit and supplies KIT Dispense based on patient and insurance preference. Use up to four times daily as directed. (FOR E11.9).  . budesonide-formoterol (SYMBICORT) 160-4.5 MCG/ACT inhaler USE 2 INHALATIONS ORALLY   TWICE DAILY  . folic acid (FOLVITE) 1 MG tablet Take 1 mg by mouth daily.  Marland Kitchen gabapentin (NEURONTIN) 100 MG capsule TAKE 2 CAPSULES BY MOUTH 3 TIMES A DAY  . ibuprofen (ADVIL,MOTRIN) 200 MG tablet Take 200 mg by mouth every 6 (six) hours as needed for headache or mild pain.  Marland Kitchen linaclotide (LINZESS) 72 MCG capsule Take 72 mcg by mouth daily as needed (constipation).   . meclizine (ANTIVERT) 25 MG tablet Take 0.5-1 tablets (12.5-25 mg total) by mouth 3 (three) times daily as needed for dizziness.  . methotrexate 50 MG/2ML injection Inject 17.5 mg into the skin every Sunday. Patient is not taking it Sunday, October 13- patient reported  . mupirocin ointment (BACTROBAN) 2 %  Place 1 application into the nose 2 (two) times daily.  . NONFORMULARY OR COMPOUNDED ITEM Estradiol .02% 1 ML Prefilled Applicator Sig: apply vaginally twice a week #90 Day Supply with 4 refills  . omeprazole (PRILOSEC) 40 MG capsule Take 40 mg by mouth daily.  [name] DELICA LANCETS 35K MISC USE TO CHECK SUGARS UP TO 4 TIMES A DAY  . sertraline (ZOLOFT) 100 MG tablet TAKE 1 TABLET BY MOUTH EVERY DAY  . SYNTHROID 88 MCG tablet TAKE 1 TABLET DAILY  . Tiotropium Bromide Monohydrate (SPIRIVA RESPIMAT) 2.5 MCG/ACT AERS Inhale 2 puffs into the lungs daily. (Patient taking differently: Inhale 1 puff into the lungs daily as needed (wheezing). )  . traMADol (ULTRAM) 50 MG tablet Take 50 mg by mouth 3 (three) times daily as needed for moderate pain.   Marland Kitchen triamcinolone cream (KENALOG) 0.1 % Apply 1 application topically daily as needed. Thin layer on vulva as needed (Patient taking differently: Apply 1 application topically daily as needed (vulva). )  . [DISCONTINUED] PROAIR HFA 108 (90 Base) MCG/ACT inhaler USE 1-2 PUFFS EVERY 4-6 HOURS AS NEEDED (Patient taking differently: Inhale 2 puffs into the lungs every 6 (six) hours as needed for wheezing. )   No facility-administered encounter medications on file as of 10/03/2019.     Allergies as of 10/03/2019 - Review Complete 10/03/2019  Allergen Reaction Noted  . Codeine    . Demerol  08/10/2011  . Meperidine hcl      Past Medical History:  Diagnosis Date  . Anxiety   . Arthritis    RA  . BPV (benign positional vertigo) 03/10/2018  . Cancer ([facility])    lung ca  . Carotid stenosis, left 09/19/2018  . COPD (chronic obstructive pulmonary disease) ([facility])   . Depression   . Dyspnea    with exertion  . GERD (gastroesophageal reflux disease)   . Headache syndrome 03/09/2019  . Hypercholesteremia   . Hypothyroidism   . Memory difficulties 10/28/2015  . Pneumonia     x3  last time 2017  . Pre-diabetes   . Rheumatoid arthritis(714.0)   . Thyroid  disease   . Tremor 10/28/2015   Jaw tremor  . Uterine prolapse   . Vitamin D deficiency     Past Surgical History:  Procedure Laterality Date  . ANGIOPLASTY Left 10/10/2018   Procedure: ANGIOPLASTY using 1cm x 14cm Xenosure Patch;  Surgeon: Marty Heck, MD;  Location: [facility];  Service: Vascular;  Laterality: Left;  . APPENDECTOMY    . COLONOSCOPY W/ POLYPECTOMY    . ENDARTERECTOMY Left 10/10/2018   Procedure: ENDARTERECTOMY CAROTID;  Surgeon: Marty Heck, MD;  Location: [facility];  Service: Vascular;  Laterality: Left;  . EYE SURGERY Bilateral    cataract  . FOOT SURGERY Bilateral    arthritis  . Hands  Bilateral    Arthritis  . HEMORRHOID SURGERY    . right lobectomy  10/2004  . TONSILLECTOMY AND ADENOIDECTOMY      Family History  Problem Relation Age of Onset  . Emphysema Mother   . Asthma Mother   . Lung cancer Father   . Pancreatic cancer Father   . Bone cancer Father   . Heart disease Brother        x1  . Heart disease Sister        x2  . Stroke Maternal Grandfather     Social History   Socioeconomic History  . Marital status: Widowed    Spouse name: Not on file  . Number of children: 3  . Years of education: LPN  . Highest education level: Not on file  Occupational History  . Occupation: retired    Fish farm manager: RETIRED    Comment: LPN at [employer]  . Financial resource strain: Not hard at all  . Food insecurity    Worry: Never true    Inability: Never true  . Transportation needs    Medical: No    Non-medical: No  Tobacco Use  . Smoking status: Former Smoker    Packs/day: 1.00    Years: 20.00    Pack years: 20.00    Types: Cigarettes    Quit date: 11/10/2004    Years since quitting: 14.9  . Smokeless tobacco: Never Used  Substance and Sexual Activity  . Alcohol use: Yes    Alcohol/week: 1.0 standard drinks    Types: 1 Glasses of wine per week    Comment: socially  . Drug use: No  . Sexual activity:  Not Currently    Comment: 1st intercourse- 19, partners- 5,   Lifestyle  . Physical activity    Days per week: 3 days    Minutes per session: 40 min  . Stress: To some extent  Relationships  . Social connections    Talks on phone: More than three times a week    Gets together: More than three times  a week    Attends religious service: More than 4 times per year    Active member of club or organization: Yes    Attends meetings of clubs or organizations: More than 4 times per year    Relationship status: Widowed  . Intimate partner violence    Fear of current or ex partner: Not on file    Emotionally abused: Not on file    Physically abused: Not on file    Forced sexual activity: Not on file  Other Topics Concern  . Not on file  Social History Narrative   Patient drinks about 3 cups of coffee daily.   Patient is right handed.     Review of Systems  Constitutional: Negative.   HENT: Negative.   Eyes: Negative.   Respiratory: Negative.  Negative for apnea, cough and shortness of breath.   Cardiovascular: Negative for chest pain and leg swelling.  Gastrointestinal: Negative.   Endocrine: Negative.   Genitourinary: Negative.     Vitals:   10/03/19 1420  BP: 112/68  Pulse: 62  Temp: 98.1 F (36.7 C)  SpO2: 95%     Physical Exam  Constitutional: She appears well-developed and well-nourished.  HENT:  Head: Normocephalic and atraumatic.  Eyes: Pupils are equal, round, and reactive to light. Right eye exhibits no discharge. Left eye exhibits no discharge.  Neck: Normal range of motion. Neck supple. No tracheal deviation present. No thyromegaly present.  Cardiovascular: Normal rate, regular rhythm and normal heart sounds.  Pulmonary/Chest: Effort normal. No respiratory distress. She has no wheezes. She has no rales.  Abdominal: Soft. Bowel sounds are normal. She exhibits no distension. There is no abdominal tenderness.  Musculoskeletal: Normal range of motion.         General: No edema.  Neurological: She is alert. No cranial nerve deficit.  Skin: Skin is warm and dry. No rash noted. No erythema.  Psychiatric: She has a normal mood and affect.   Data Reviewed: Last PFT was 08/19/2018-mild obstructive lung disease  Assessment:  COPD -Stable symptoms on Anoro and Symbicort -Continue bronchodilator treatments  Rheumatoid arthritis -Chronically on methotrexate  Plan/Recommendations: Obtain chest x-ray  Continue bronchodilators  Call with significant concerns  I will see her back in the office in about 6 months   Sherrilyn Rist MD [facility] 10/03/2019, 2:40 PM  CC: Binnie Rail, MD

## 2019-10-03 NOTE — Patient Instructions (Signed)
COPD with controlled symptoms  Continue Symbicort Continue albuterol  Call with any symptoms suggesting exacerbation of your symptoms  We will get a chest x-ray today  I will see you back in the office in about 6 months

## 2019-10-05 ENCOUNTER — Encounter: Payer: Self-pay | Admitting: Gynecology

## 2019-10-11 DIAGNOSIS — R634 Abnormal weight loss: Secondary | ICD-10-CM | POA: Diagnosis not present

## 2019-10-11 DIAGNOSIS — Z6823 Body mass index (BMI) 23.0-23.9, adult: Secondary | ICD-10-CM | POA: Diagnosis not present

## 2019-10-11 DIAGNOSIS — M2550 Pain in unspecified joint: Secondary | ICD-10-CM | POA: Diagnosis not present

## 2019-10-11 DIAGNOSIS — M150 Primary generalized (osteo)arthritis: Secondary | ICD-10-CM | POA: Diagnosis not present

## 2019-10-11 DIAGNOSIS — Z79899 Other long term (current) drug therapy: Secondary | ICD-10-CM | POA: Diagnosis not present

## 2019-10-11 DIAGNOSIS — M0579 Rheumatoid arthritis with rheumatoid factor of multiple sites without organ or systems involvement: Secondary | ICD-10-CM | POA: Diagnosis not present

## 2019-10-11 DIAGNOSIS — R5382 Chronic fatigue, unspecified: Secondary | ICD-10-CM | POA: Diagnosis not present

## 2019-10-20 DIAGNOSIS — L718 Other rosacea: Secondary | ICD-10-CM | POA: Diagnosis not present

## 2019-10-20 DIAGNOSIS — H02403 Unspecified ptosis of bilateral eyelids: Secondary | ICD-10-CM | POA: Diagnosis not present

## 2019-10-20 DIAGNOSIS — Z961 Presence of intraocular lens: Secondary | ICD-10-CM | POA: Diagnosis not present

## 2019-10-20 DIAGNOSIS — H18519 Endothelial corneal dystrophy, unspecified eye: Secondary | ICD-10-CM | POA: Diagnosis not present

## 2019-10-24 ENCOUNTER — Other Ambulatory Visit: Payer: Self-pay

## 2019-10-25 ENCOUNTER — Encounter: Payer: Self-pay | Admitting: Gynecology

## 2019-10-25 ENCOUNTER — Ambulatory Visit (INDEPENDENT_AMBULATORY_CARE_PROVIDER_SITE_OTHER): Payer: Medicare Other | Admitting: Gynecology

## 2019-10-25 VITALS — BP 124/76

## 2019-10-25 DIAGNOSIS — R300 Dysuria: Secondary | ICD-10-CM

## 2019-10-25 DIAGNOSIS — Z4689 Encounter for fitting and adjustment of other specified devices: Secondary | ICD-10-CM

## 2019-10-25 DIAGNOSIS — I6523 Occlusion and stenosis of bilateral carotid arteries: Secondary | ICD-10-CM

## 2019-10-25 MED ORDER — NITROFURANTOIN MONOHYD MACRO 100 MG PO CAPS: 100.0000 mg | ORAL_CAPSULE | Freq: Two times a day (BID) | ORAL | 0 refills | Status: DC

## 2019-10-25 NOTE — Progress Notes (Signed)
    Lisa Vega 27-Apr-1932 [id number]        85 y.o.  [id number] presents for pessary placement.  History of pelvic relaxation with uterine prolapse, cystocele and rectocele.  Was having some pelvic discomfort earlier this month and had her pessary removed.  She is not tolerating her prolapse and would like to have the pessary replaced.  Notes that the discomfort she was having previously seems better.  She does note some dysuria and frequency over the last several days.  No fever or chills.  Past medical history,surgical history, problem list, medications, allergies, family history and social history were all reviewed and documented in the EPIC chart.  Directed ROS with pertinent positives and negatives documented in the history of present illness/assessment and plan.  Exam: [name] assistant Vitals:   10/25/19 1044  BP: 124/76   General appearance:  Normal Abdomen soft nontender without mass guarding rebound Pelvic external BUS vagina with atrophic changes.  Bimanual without masses or tenderness.  Ring pessary replaced without difficulty  Assessment/Plan:  85 y.o. [id number] with pelvic relaxation.  Ring pessary replaced.  We will follow-up if she has any discomfort or other issues.  She is having some dysuria and feels she may be getting an early UTI.  Urine analysis does show some squamous cells but also bacteria and WBCs.  Will cover with Macrobid 100 mg twice daily x7 days.  She took this in August and did well with this for similar symptoms.  We will follow-up if her symptoms persist.    Anastasio Auerbach MD, 11:01 AM 10/25/2019

## 2019-10-25 NOTE — Patient Instructions (Signed)
Take the prescribed antibiotic twice daily for 7 days.  Follow-up if your urinary symptoms persist.

## 2019-10-26 ENCOUNTER — Encounter: Payer: Self-pay | Admitting: Physical Therapy

## 2019-10-26 NOTE — Therapy (Signed)
[facility] [address] [place], [place], [address] Phone: [phone]   Fax:  [phone]  Patient Details  Name: Lisa Vega MRN: [id number] Date of Birth: [birth year] Referring Provider:  No ref. provider found  Encounter Date: 10/26/2019  PHYSICAL THERAPY DISCHARGE SUMMARY  Visits from Start of Care: 3  Current functional level related to goals / functional outcomes: Contacted pt due to missed visit; pt reported resolution of dizziness and wished to cancel remaining visits and be D/C from therapy.     Remaining deficits: None   Education / Equipment: HEP  Plan: Patient agrees to discharge.  Patient goals were met. Patient is being discharged due to being pleased with the current functional level.  ?????     Rico Junker, PT, DPT 10/26/19    2:37 PM    [facility] [address] [place], [place], [address] Phone: [phone]   Fax:  [phone]

## 2019-10-27 LAB — URINALYSIS, COMPLETE W/RFL CULTURE
Bilirubin Urine: NEGATIVE
Glucose, UA: NEGATIVE
Hyaline Cast: NONE SEEN /LPF
Ketones, ur: NEGATIVE
Nitrites, Initial: NEGATIVE
Protein, ur: NEGATIVE
Specific Gravity, Urine: 1.015 (ref 1.001–1.03)
pH: 5.5 (ref 5.0–8.0)

## 2019-10-27 LAB — CULTURE INDICATED

## 2019-10-27 LAB — URINE CULTURE
MICRO NUMBER:: 1039999
SPECIMEN QUALITY:: ADEQUATE

## 2019-11-02 ENCOUNTER — Ambulatory Visit: Payer: Medicare Other | Admitting: Gynecology

## 2019-11-09 ENCOUNTER — Other Ambulatory Visit: Payer: Self-pay | Admitting: Pulmonary Disease

## 2019-11-14 DIAGNOSIS — M25562 Pain in left knee: Secondary | ICD-10-CM | POA: Diagnosis not present

## 2019-11-14 DIAGNOSIS — M5030 Other cervical disc degeneration, unspecified cervical region: Secondary | ICD-10-CM | POA: Diagnosis not present

## 2019-12-06 ENCOUNTER — Telehealth: Payer: Self-pay | Admitting: Pulmonary Disease

## 2019-12-06 MED ORDER — SPIRIVA RESPIMAT 2.5 MCG/ACT IN AERS: 2.0000 | INHALATION_SPRAY | Freq: Every day | RESPIRATORY_TRACT | 1 refills | Status: DC

## 2019-12-06 NOTE — Telephone Encounter (Signed)
Spoke with pt and advised rx  For Spiriva respimat was sent to [facility] mail order pharmacy. Nothing further is needed.

## 2019-12-06 NOTE — Progress Notes (Addendum)
PATIENT: Lisa Vega DOB: 1932/10/03  REASON FOR VISIT: follow up HISTORY FROM: patient  HISTORY OF PRESENT ILLNESS: Today 12/07/19  Lisa Vega is an 85 year old female with history of cerebrovascular disease.  She has history of dizziness, headache, and memory disturbance.  She remains on gabapentin 200 mg 3 times a day.  She completed physical therapy for positional vertigo that was beneficial.  She continues to complain of problems with headache.  She describes a burning sensation to the top of her head, sides of her head, or back of the head.  She reports a sensation may be sharp, is brief and subsides, like a twinge.  She also complains of some neck stiffness.  She has not had any falls. She continues to have numbness to the left side of her neck and jaw from prior surgery.  She does have significant rheumatoid arthritis, that has caused deformity to her hands.  She recently had a cortisone injection to her left knee.  She lives with her daughter, she drives a car.  She thinks gabapentin has been helpful for her headache and jaw tremor.  She does report drowsiness after taking her midday dose.  She continues to report difficulty with short-term memory.  She presents today for evaluation unaccompanied.  HISTORY 03/09/2019 Dr. Jannifer Franklin: Lisa Vega is an 85 year old right-handed white female with a history of cerebrovascular disease.  The patient had left carotid artery surgery on 10 October 2018 for a high-grade left carotid stenosis.  She has done well following this.  The patient has recently developed positional vertigo again, she will have dizziness when she rolls over to her right side in bed, and she may have brief dizziness when she first gets up out of bed.  The patient feels somewhat unsteady with walking as well, she has been set up for physical therapy for vestibular rehabilitation in the near future.  The patient has significant degenerative arthritis of the left knee, she  has shortness of breath following a lobectomy of the lung.  The patient is now having increased problems with headaches, she has a burning sensation on the top of the head on the side of the head or back of the head, she also reports some neck stiffness.  She denies any falls.  She indicates that she is taking low-dose gabapentin 100 mg 3 times daily, she believes that this has helped her headaches and helped her tremors in the past.   REVIEW OF SYSTEMS: Out of a complete 14 system review of symptoms, the patient complains only of the following symptoms, and all other reviewed systems are negative.  Headache, tremor, memory loss  ALLERGIES: Allergies  Allergen Reactions   Codeine     REACTION: hallucinations   Demerol    Meperidine Hcl     REACTION: severe GI upset    HOME MEDICATIONS: Outpatient Medications Prior to Visit  Medication Sig Dispense Refill   acetaminophen (TYLENOL) 325 MG tablet Take 650 mg by mouth every 6 (six) hours as needed for mild pain.      albuterol (PROAIR HFA) 108 (90 Base) MCG/ACT inhaler USE 1-2 PUFFS EVERY 4-6 HOURS AS NEEDED 8.5 g 5   ALPRAZolam (XANAX) 0.25 MG tablet Take 1 tablet (0.25 mg total) by mouth daily as needed. for anxiety 20 tablet 1   blood glucose meter kit and supplies KIT Dispense based on patient and insurance preference. Use up to four times daily as directed. (FOR E11.9). 1 each 0  budesonide-formoterol (SYMBICORT) 160-4.5 MCG/ACT inhaler Inhale 2 puffs into the lungs 2 (two) times daily. 94.7 g 1   folic acid (FOLVITE) 1 MG tablet Take 1 mg by mouth daily.     ibuprofen (ADVIL,MOTRIN) 200 MG tablet Take 200 mg by mouth every 6 (six) hours as needed for headache or mild pain.     linaclotide (LINZESS) 72 MCG capsule Take 72 mcg by mouth daily as needed (constipation).      meclizine (ANTIVERT) 25 MG tablet Take 0.5-1 tablets (12.5-25 mg total) by mouth 3 (three) times daily as needed for dizziness. 30 tablet 5   methotrexate  50 MG/2ML injection Inject 17.5 mg into the skin every Sunday. Patient is not taking it Sunday, October 13- patient reported  1   mupirocin ointment (BACTROBAN) 2 % Place 1 application into the nose 2 (two) times daily. 22 g 1   nitrofurantoin, macrocrystal-monohydrate, (MACROBID) 100 MG capsule Take 1 capsule (100 mg total) by mouth 2 (two) times daily. 14 capsule 0   NONFORMULARY OR COMPOUNDED ITEM Estradiol .02% 1 ML Prefilled Applicator Sig: apply vaginally twice a week #90 Day Supply with 4 refills 1 each 0   omeprazole (PRILOSEC) 40 MG capsule Take 40 mg by mouth daily.     ONETOUCH DELICA LANCETS 09G MISC USE TO CHECK SUGARS UP TO 4 TIMES A DAY  0   sertraline (ZOLOFT) 100 MG tablet TAKE 1 TABLET BY MOUTH EVERY DAY 90 tablet 1   SYNTHROID 88 MCG tablet TAKE 1 TABLET DAILY 90 tablet 1   Tiotropium Bromide Monohydrate (SPIRIVA RESPIMAT) 2.5 MCG/ACT AERS Inhale 2 puffs into the lungs daily. 12 g 1   traMADol (ULTRAM) 50 MG tablet Take 50 mg by mouth 3 (three) times daily as needed for moderate pain.   0   triamcinolone cream (KENALOG) 0.1 % Apply 1 application topically daily as needed. Thin layer on vulva as needed (Patient taking differently: Apply 1 application topically daily as needed (vulva). ) 30 g 3   gabapentin (NEURONTIN) 100 MG capsule TAKE 2 CAPSULES BY MOUTH 3 TIMES A DAY 540 capsule 1   No facility-administered medications prior to visit.    PAST MEDICAL HISTORY: Past Medical History:  Diagnosis Date   Anxiety    Arthritis    RA   BPV (benign positional vertigo) 03/10/2018   Cancer ([place])    lung ca   Carotid stenosis, left 09/19/2018   COPD (chronic obstructive pulmonary disease) (HCC)    Depression    Dyspnea    with exertion   GERD (gastroesophageal reflux disease)    Headache syndrome 03/09/2019   Hypercholesteremia    Hypothyroidism    Memory difficulties 10/28/2015   Pneumonia     x3  last time 2017   Pre-diabetes    Rheumatoid  arthritis(714.0)    Thyroid disease    Tremor 10/28/2015   Jaw tremor   Uterine prolapse    Vitamin D deficiency     PAST SURGICAL HISTORY: Past Surgical History:  Procedure Laterality Date   ANGIOPLASTY Left 10/10/2018   Procedure: ANGIOPLASTY using 1cm x 14cm Xenosure Patch;  Surgeon: Marty Heck, MD;  Location: [facility];  Service: Vascular;  Laterality: Left;   APPENDECTOMY     COLONOSCOPY W/ POLYPECTOMY     ENDARTERECTOMY Left 10/10/2018   Procedure: ENDARTERECTOMY CAROTID;  Surgeon: Marty Heck, MD;  Location: [facility] OR;  Service: Vascular;  Laterality: Left;   EYE SURGERY Bilateral    cataract  FOOT SURGERY Bilateral    arthritis   Hands  Bilateral    Arthritis   HEMORRHOID SURGERY     right lobectomy  10/2004   TONSILLECTOMY AND ADENOIDECTOMY      FAMILY HISTORY: Family History  Problem Relation Age of Onset   Emphysema Mother    Asthma Mother    Lung cancer Father    Pancreatic cancer Father    Bone cancer Father    Heart disease Brother        x1   Heart disease Sister        x2   Stroke Maternal Grandfather     SOCIAL HISTORY: Social History   Socioeconomic History   Marital status: Widowed    Spouse name: Not on file   Number of children: 3   Years of education: LPN   Highest education level: Not on file  Occupational History   Occupation: retired    Fish farm manager: RETIRED    Comment: LPN at [employer]   Smoking status: Former Smoker    Packs/day: 1.00    Years: 20.00    Pack years: 20.00    Types: Cigarettes    Quit date: 11/10/2004    Years since quitting: 15.0   Smokeless tobacco: Never Used  Substance and Sexual Activity   Alcohol use: Yes    Alcohol/week: 1.0 standard drinks    Types: 1 Glasses of wine per week    Comment: socially   Drug use: No   Sexual activity: Not Currently    Comment: 1st intercourse- 19, partners- 5,   Other Topics Concern   Not on file    Social History Narrative   Patient drinks about 3 cups of coffee daily.   Patient is right handed.    Social Determinants of Health   Financial Resource Strain: Low Risk    Difficulty of Paying Living Expenses: Not hard at all  Food Insecurity: No Food Insecurity   Worried About Charity fundraiser in the Last Year: Never true   Moonshine in the Last Year: Never true  Transportation Needs: No Transportation Needs   Lack of Transportation (Medical): No   Lack of Transportation (Non-Medical): No  Physical Activity: Insufficiently Active   Days of Exercise per Week: 3 days   Minutes of Exercise per Session: 40 min  Stress: Stress Concern Present   Feeling of Stress : To some extent  Social Connections: Slightly Isolated   Frequency of Communication with Friends and Family: More than three times a week   Frequency of Social Gatherings with Friends and Family: More than three times a week   Attends Religious Services: More than 4 times per year   Active Member of [employer] or Organizations: Yes   Attends Archivist Meetings: More than 4 times per year   Marital Status: Widowed  Intimate Partner Violence:    Fear of Current or Ex-Partner: Not on file   Emotionally Abused: Not on file   Physically Abused: Not on file   Sexually Abused: Not on file    PHYSICAL EXAM  Vitals:   12/07/19 1329  BP: 138/74  Pulse: 69  Temp: 97.9 F (36.6 C)  TempSrc: Oral  Weight: 138 lb 6.4 oz (62.8 kg)  Height: 5' (1.524 m)   Body mass index is 27.03 kg/m.  Generalized: Well developed, in no acute distress   Neurological examination  Mentation: Alert oriented to time, place, history taking. Follows  all commands speech and language fluent Cranial nerve II-XII: Pupils were equal round reactive to light. Extraocular movements were full, visual field were full on confrontational test. Facial sensation and strength were normal. Head turning and shoulder shrug  were  normal and symmetric. Motor: The motor testing reveals 5 over 5 strength of all 4 extremities. Good symmetric motor tone is noted throughout.  Sensory: Sensory testing is intact to soft touch on all 4 extremities. No evidence of extinction is noted.  Coordination: Cerebellar testing reveals good finger-nose-finger and heel-to-shin bilaterally.  Gait and station: Gait is normal. Tandem gait is normal.  Reflexes: Deep tendon reflexes are symmetric and normal bilaterally.   DIAGNOSTIC DATA (LABS, IMAGING, TESTING) - I reviewed patient records, labs, notes, testing and imaging myself where available.  Lab Results  Component Value Date   WBC 5.3 08/01/2019   HGB 12.4 08/01/2019   HCT 37.6 08/01/2019   MCV 102.3 (H) 08/01/2019   PLT 254.0 08/01/2019      Component Value Date/Time   NA 139 08/01/2019 1215   NA 139 08/10/2011 1457   K 4.7 08/01/2019 1215   K 4.4 08/10/2011 1457   CL 102 08/01/2019 1215   CL 98 08/10/2011 1457   CO2 32 08/01/2019 1215   CO2 29 08/10/2011 1457   GLUCOSE 98 08/01/2019 1215   GLUCOSE 98 08/10/2011 1457   BUN 12 08/01/2019 1215   BUN 15 08/10/2011 1457   CREATININE 0.83 08/01/2019 1215   CREATININE 0.9 08/10/2011 1457   CALCIUM 9.7 08/01/2019 1215   CALCIUM 8.9 08/10/2011 1457   PROT 7.3 08/01/2019 1215   PROT 6.7 04/21/2018 0821   PROT 6.8 08/10/2011 1457   ALBUMIN 4.0 08/01/2019 1215   ALBUMIN 4.0 04/21/2018 0821   AST 19 08/01/2019 1215   AST 26 08/10/2011 1457   ALT 11 08/01/2019 1215   ALT 21 08/10/2011 1457   ALKPHOS 85 08/01/2019 1215   ALKPHOS 67 08/10/2011 1457   BILITOT 0.5 08/01/2019 1215   BILITOT 0.5 04/21/2018 0821   BILITOT 0.70 08/10/2011 1457   GFRNONAA >60 10/11/2018 0415   GFRAA >60 10/11/2018 0415   Lab Results  Component Value Date   CHOL 221 (H) 08/01/2019   HDL 71.30 08/01/2019   LDLCALC 126 (H) 08/01/2019   TRIG 117.0 08/01/2019   CHOLHDL 3 08/01/2019   Lab Results  Component Value Date   HGBA1C 5.6 08/01/2019    Lab Results  Component Value Date   VITAMINB12 354 10/28/2015   Lab Results  Component Value Date   TSH 0.55 08/01/2019      ASSESSMENT AND PLAN 85 y.o. year old female  has a past medical history of Anxiety, Arthritis, BPV (benign positional vertigo) (03/10/2018), Cancer ([place]), Carotid stenosis, left (09/19/2018), COPD (chronic obstructive pulmonary disease) ([place]), Depression, Dyspnea, GERD (gastroesophageal reflux disease), Headache syndrome (03/09/2019), Hypercholesteremia, Hypothyroidism, Memory difficulties (10/28/2015), Pneumonia, Pre-diabetes, Rheumatoid arthritis(714.0), Thyroid disease, Tremor (10/28/2015), Uterine prolapse, and Vitamin D deficiency. here with:  1.  Cerebrovascular disease, status post left carotid endarterectomy 2.  Positional vertigo 3.  Reports of mild memory disturbance 4.  Headache, possibly cervicogenic headache  She continues to complain of headache.  I will adjust her gabapentin, due to report of midday drowsiness with this dose.  She will take 200 mg in the morning, 300 mg at bedtime.  We will follow her memory issues over time.  Her vertigo is under good control, after completing physical therapy.  She will follow-up with Dr. Carlis Abbott,  regarding numbness to her neck from the surgery.  She will follow-up in 8 months or sooner if needed.  I did advise if her symptoms worsen or she develops any new symptoms she will let us know.  I spent 15 minutes with the patient. 50% of this time was spent discussing her plan of care.  Butler Denmark, AGNP-C, DNP 12/07/2019, 2:22 PM [facility] [address], [place], [place] [address] [phone]  I have read the note, and I agree with the clinical assessment and plan.  Kathrynn Ducking

## 2019-12-06 NOTE — Telephone Encounter (Signed)
Patient scheduled for high dose flu vaccine 12/08/19.  Nothing further at this time.

## 2019-12-07 ENCOUNTER — Encounter: Payer: Self-pay | Admitting: Neurology

## 2019-12-07 ENCOUNTER — Other Ambulatory Visit: Payer: Self-pay

## 2019-12-07 ENCOUNTER — Ambulatory Visit (INDEPENDENT_AMBULATORY_CARE_PROVIDER_SITE_OTHER): Payer: Medicare Other | Admitting: Neurology

## 2019-12-07 VITALS — BP 138/74 | HR 69 | Temp 97.9°F | Ht 60.0 in | Wt 138.4 lb

## 2019-12-07 DIAGNOSIS — G4489 Other headache syndrome: Secondary | ICD-10-CM | POA: Diagnosis not present

## 2019-12-07 MED ORDER — GABAPENTIN 100 MG PO CAPS: ORAL_CAPSULE | ORAL | 6 refills | Status: DC

## 2019-12-07 NOTE — Patient Instructions (Signed)
Let's adjust your dose of gabapentin 200 mg in the morning, 300 mg at bedtime   Follow-up with Dr. Carlis Abbott about the numbness to your left face   Return in 8 months or sooner if needed

## 2019-12-08 ENCOUNTER — Other Ambulatory Visit: Payer: Self-pay

## 2019-12-08 ENCOUNTER — Ambulatory Visit (INDEPENDENT_AMBULATORY_CARE_PROVIDER_SITE_OTHER): Payer: Medicare Other

## 2019-12-08 DIAGNOSIS — Z23 Encounter for immunization: Secondary | ICD-10-CM

## 2019-12-23 DIAGNOSIS — Z20828 Contact with and (suspected) exposure to other viral communicable diseases: Secondary | ICD-10-CM | POA: Diagnosis not present

## 2019-12-28 ENCOUNTER — Telehealth: Payer: Self-pay | Admitting: Internal Medicine

## 2019-12-28 NOTE — Telephone Encounter (Signed)
Pt needs a refill of her One Touch Ultra 2 test strips sent to  [id number] - [place], [place] Phone:  [phone]  Fax:  [phone]

## 2020-01-02 MED ORDER — ONETOUCH ULTRA VI STRP: ORAL_STRIP | 5 refills | Status: DC

## 2020-01-02 NOTE — Telephone Encounter (Signed)
Sent rx to [facility]Marland KitchenJohny Chess

## 2020-01-10 DIAGNOSIS — Z20828 Contact with and (suspected) exposure to other viral communicable diseases: Secondary | ICD-10-CM | POA: Diagnosis not present

## 2020-01-16 DIAGNOSIS — M0579 Rheumatoid arthritis with rheumatoid factor of multiple sites without organ or systems involvement: Secondary | ICD-10-CM | POA: Diagnosis not present

## 2020-01-16 DIAGNOSIS — Z79899 Other long term (current) drug therapy: Secondary | ICD-10-CM | POA: Diagnosis not present

## 2020-01-19 ENCOUNTER — Other Ambulatory Visit: Payer: Self-pay

## 2020-01-22 ENCOUNTER — Other Ambulatory Visit: Payer: Self-pay

## 2020-01-22 ENCOUNTER — Ambulatory Visit: Payer: Medicare Other | Admitting: Obstetrics and Gynecology

## 2020-01-23 ENCOUNTER — Other Ambulatory Visit: Payer: Self-pay

## 2020-01-23 ENCOUNTER — Encounter: Payer: Self-pay | Admitting: Obstetrics and Gynecology

## 2020-01-23 ENCOUNTER — Ambulatory Visit (INDEPENDENT_AMBULATORY_CARE_PROVIDER_SITE_OTHER): Payer: Medicare Other | Admitting: Obstetrics and Gynecology

## 2020-01-23 VITALS — BP 118/76

## 2020-01-23 DIAGNOSIS — R300 Dysuria: Secondary | ICD-10-CM

## 2020-01-23 DIAGNOSIS — Z466 Encounter for fitting and adjustment of urinary device: Secondary | ICD-10-CM

## 2020-01-23 NOTE — Progress Notes (Signed)
   Lisa Vega  06/01/1932 [id number]  HPI 86 y.o. [id number] who presents today for pessary maintenance and cleaning.  History of pelvic relaxation with uterine prolapse, cystocele and rectocele.  She has a ring pessary with support.  She denies any vaginal bleeding or unusual vaginal discharge.  She notes occasional low back pains that she thinks may be related to bowel movement function.   She has had occasional dysuria and increased frequency of urination.  No fever or chills.  Past medical history,surgical history, problem list, medications, allergies, family history and social history were all reviewed and documented as reviewed in the EPIC chart.  ROS:  Feeling well. No dyspnea or chest pain on exertion.  No abdominal pain, change in bowel habits, black or bloody stools.  Remainder of positive review of systems outlined in HPI.  Physical Exam  BP 118/76   General: Pleasant female, no acute distress, alert and oriented PELVIC EXAM: VULVA: normal appearing vulva with no masses, tenderness or lesions, atrophic changes. The ring pessary with support is removed, cleansed, and reinserted without difficulty.  Urinalysis indicates 0-5 WBC, RBC 3-10, 0-5 squamous cells, few bacteria, no yeast, no casts, negative nitrates, negative leukocyte esterase.  Assessment 86 year old [id number] with a history of pelvic relaxation using pessary for prolapse management  Plan Pessary is removed, cleansed and replaced without difficulty.  There is no evidence of urinary tract infection on today's urinalysis.  I recommend that she return in 3 months for her normal pessary maintenance exam.  Let us know if any urinary symptoms and/or back pain worsens.   Larey Days MD 01/23/20

## 2020-01-25 LAB — URINALYSIS, COMPLETE W/RFL CULTURE
Bilirubin Urine: NEGATIVE
Glucose, UA: NEGATIVE
Hyaline Cast: NONE SEEN /LPF
Ketones, ur: NEGATIVE
Leukocyte Esterase: NEGATIVE
Nitrites, Initial: NEGATIVE
Protein, ur: NEGATIVE
Specific Gravity, Urine: 1.006 (ref 1.001–1.03)
pH: 6 (ref 5.0–8.0)

## 2020-01-25 LAB — URINE CULTURE
MICRO NUMBER:: 10081463
Result:: NO GROWTH
SPECIMEN QUALITY:: ADEQUATE

## 2020-01-25 LAB — CULTURE INDICATED

## 2020-01-26 ENCOUNTER — Other Ambulatory Visit: Payer: Self-pay | Admitting: Internal Medicine

## 2020-02-01 NOTE — Patient Instructions (Addendum)
  Blood work was ordered.     Medications reviewed and updated.  Changes include :   Start pepcid 40 mg daily.   Your prescription(s) have been submitted to your pharmacy. Please take as directed and contact our office if you believe you are having problem(s) with the medication(s).     Please followup in 6 months   

## 2020-02-01 NOTE — Progress Notes (Signed)
Subjective:    Patient ID: Lisa Vega, female    DOB: 03-27-1932, 86 y.o.   MRN: [id number]  HPI The patient is here for follow up of their chronic medical problems, including diabetes, hypothyroidism, depression, anxiety, GERD.  She is taking all of her medications as prescribed.   She is not exercising regularly.      Her son died of COVID last October 24, 2023.  Losing him was very difficult, but she does feel like she is doing okay.  She does constantly worry and takes the alprazolam as needed.  She is still taking her sertraline daily.  Overall she feels her anxiety and depression the are controlled.  Last year she did have a difficult transition moving in with her daughter and her family, but it is okay.  She is eating well overall.  Her sugars have been very well controlled at home typically 110-112 and very consistent.  She has not had any low sugars.  She does experience frequent GERD.  She does follow with GI and is taking her omeprazole daily.   Medications and allergies reviewed with patient and updated if appropriate.  Patient Active Problem List   Diagnosis Date Noted  . RUQ pain 08/01/2019  . Right flank pain 08/01/2019  . Headache syndrome 03/09/2019  . Upper respiratory tract infection 01/23/2019  . Carotid stenosis 10/10/2018  . Carotid stenosis, left 09/19/2018  . Benign positional vertigo 03/10/2018  . History of lung cancer 11/29/2017  . Difficulty sleeping 09/22/2017  . Diabetes ([place]) 05/13/2017  . Fatigue 05/12/2017  . CAP (community acquired pneumonia) 05/03/2017  . Orthopnea 01/12/2017  . GERD (gastroesophageal reflux disease) 11/09/2016  . Osteoporosis 05/06/2016  . Depression with anxiety 05/06/2016  . Hiatal hernia 05/06/2016  . Hypothyroidism 05/06/2016  . Tremor 10/28/2015  . Memory difficulties 10/28/2015  . Hoarseness 10/14/2015  . History of vitamin D deficiency 05/02/2015  . Other constipation 01/29/2015  . Abdominal pain, chronic,  epigastric 11/01/2014  . 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 ([place]) 05/30/2014  . Diverticulitis of colon without hemorrhage 08/07/2013  . Prolapse of female pelvic organs 06/22/2013  . Neck pain 03/10/2013  . Vaginal atrophy 03/09/2013  . Rectocele 03/09/2013  . Cystocele 03/09/2013  . Female pelvic pain 03/09/2013  . Urinary incontinence 03/09/2013  . COPD (chronic obstructive pulmonary disease) ([place]) 01/16/2011  . CARCINOMA, LUNG, NONSMALL CELL 12/17/2010  . Rheumatoid arthritis ([place]) 12/17/2010  . Dyspnea on exertion 12/17/2010    Current Outpatient Medications on File Prior to Visit  Medication Sig Dispense Refill  . acetaminophen (TYLENOL) 325 MG tablet Take 650 mg by mouth every 6 (six) hours as needed for mild pain.     Marland Kitchen albuterol (PROAIR HFA) 108 (90 Base) MCG/ACT inhaler USE 1-2 PUFFS EVERY 4-6 HOURS AS NEEDED 8.5 g 5  . aspirin EC 81 MG tablet Take 81 mg by mouth daily.    . blood glucose meter kit and supplies KIT Dispense based on patient and insurance preference. Use up to four times daily as directed. (FOR E11.9). 1 each 0  . budesonide-formoterol (SYMBICORT) 160-4.5 MCG/ACT inhaler Inhale 2 puffs into the lungs 2 (two) times daily. 18.5 g 1  . folic acid (FOLVITE) 1 MG tablet Take 1 mg by mouth daily.    Marland Kitchen gabapentin (NEURONTIN) 100 MG capsule Take 2 in the morning, take 3 at bedtime 150 capsule 6  . glucose blood (ONETOUCH ULTRA) test strip Use to check blood sugars twice a day 100 each  5  . ibuprofen (ADVIL,MOTRIN) 200 MG tablet Take 200 mg by mouth every 6 (six) hours as needed for headache or mild pain.    Marland Kitchen linaclotide (LINZESS) 72 MCG capsule Take 72 mcg by mouth daily as needed (constipation).     . meclizine (ANTIVERT) 25 MG tablet Take 0.5-1 tablets (12.5-25 mg total) by mouth 3 (three) times daily as needed for dizziness. 30 tablet 5  . methotrexate 50 MG/2ML injection Inject 17.5 mg into the skin every Sunday. Patient is not taking it Sunday,  October 13- patient reported  1  . mupirocin ointment (BACTROBAN) 2 % Place 1 application into the nose 2 (two) times daily. 22 g 1  . NONFORMULARY OR COMPOUNDED ITEM Estradiol .02% 1 ML Prefilled Applicator Sig: apply vaginally twice a week #90 Day Supply with 4 refills 1 each 0  . omeprazole (PRILOSEC) 40 MG capsule Take 40 mg by mouth daily.    [name] DELICA LANCETS 17O MISC USE TO CHECK SUGARS UP TO 4 TIMES A DAY  0  . sertraline (ZOLOFT) 100 MG tablet TAKE 1 TABLET BY MOUTH EVERY DAY 90 tablet 1  . SYNTHROID 88 MCG tablet TAKE 1 TABLET DAILY 90 tablet 1  . Tiotropium Bromide Monohydrate (SPIRIVA RESPIMAT) 2.5 MCG/ACT AERS Inhale 2 puffs into the lungs daily. 12 g 1  . traMADol (ULTRAM) 50 MG tablet Take 50 mg by mouth 3 (three) times daily as needed for moderate pain.   0  . triamcinolone cream (KENALOG) 0.1 % Apply 1 application topically daily as needed. Thin layer on vulva as needed (Patient taking differently: Apply 1 application topically daily as needed (vulva). ) 30 g 3   No current facility-administered medications on file prior to visit.    Past Medical History:  Diagnosis Date  . Anxiety   . Arthritis    RA  . BPV (benign positional vertigo) 03/10/2018  . Cancer ([place])    lung ca  . Carotid stenosis, left 09/19/2018  . COPD (chronic obstructive pulmonary disease) ([place])   . Depression   . Dyspnea    with exertion  . GERD (gastroesophageal reflux disease)   . Headache syndrome 03/09/2019  . Hypercholesteremia   . Hypothyroidism   . Memory difficulties 10/28/2015  . Pneumonia     x3  last time 2017  . Pre-diabetes   . Rheumatoid arthritis(714.0)   . Thyroid disease   . Tremor 10/28/2015   Jaw tremor  . Uterine prolapse   . Vitamin D deficiency     Past Surgical History:  Procedure Laterality Date  . ANGIOPLASTY Left 10/10/2018   Procedure: ANGIOPLASTY using 1cm x 14cm Xenosure Patch;  Surgeon: Marty Heck, MD;  Location: [facility];  Service: Vascular;   Laterality: Left;  . APPENDECTOMY    . COLONOSCOPY W/ POLYPECTOMY    . ENDARTERECTOMY Left 10/10/2018   Procedure: ENDARTERECTOMY CAROTID;  Surgeon: Marty Heck, MD;  Location: [facility];  Service: Vascular;  Laterality: Left;  . EYE SURGERY Bilateral    cataract  . FOOT SURGERY Bilateral    arthritis  . Hands  Bilateral    Arthritis  . HEMORRHOID SURGERY    . right lobectomy  10/2004  . TONSILLECTOMY AND ADENOIDECTOMY      Social History   Socioeconomic History  . Marital status: Widowed    Spouse name: Not on file  . Number of children: 3  . Years of education: LPN  . Highest education level: Not on file  Occupational History  . Occupation: retired    Fish farm manager: RETIRED    Comment: LPN at [employer]  Tobacco Use  . Smoking status: Former Smoker    Packs/day: 1.00    Years: 20.00    Pack years: 20.00    Types: Cigarettes    Quit date: 11/10/2004    Years since quitting: 15.2  . Smokeless tobacco: Never Used  Substance and Sexual Activity  . Alcohol use: Yes    Alcohol/week: 1.0 standard drinks    Types: 1 Glasses of wine per week    Comment: socially  . Drug use: No  . Sexual activity: Not Currently    Comment: 1st intercourse- 19, partners- 5,   Other Topics Concern  . Not on file  Social History Narrative   Patient drinks about 3 cups of coffee daily.   Patient is right handed.    Social Determinants of Health   Financial Resource Strain:   . Difficulty of Paying Living Expenses: Not on file  Food Insecurity:   . Worried About Charity fundraiser in the Last Year: Not on file  . Ran Out of Food in the Last Year: Not on file  Transportation Needs:   . Lack of Transportation (Medical): Not on file  . Lack of Transportation (Non-Medical): Not on file  Physical Activity:   . Days of Exercise per Week: Not on file  . Minutes of Exercise per Session: Not on file  Stress:   . Feeling of Stress : Not on file  Social Connections:   .  Frequency of Communication with Friends and Family: Not on file  . Frequency of Social Gatherings with Friends and Family: Not on file  . Attends Religious Services: Not on file  . Active Member of Clubs or Organizations: Not on file  . Attends Archivist Meetings: Not on file  . Marital Status: Not on file    Family History  Problem Relation Age of Onset  . Emphysema Mother   . Asthma Mother   . Lung cancer Father   . Pancreatic cancer Father   . Bone cancer Father   . Heart disease Brother        x1  . Heart disease Sister        x2  . Stroke Maternal Grandfather     Review of Systems  Constitutional: Negative for chills and fever.  Respiratory: Positive for shortness of breath (chronic - no change). Negative for cough and wheezing.   Cardiovascular: Positive for chest pain (occasionally) and palpitations (occ). Negative for leg swelling.  Neurological: Positive for dizziness (occ) and headaches.       Objective:   Vitals:   02/02/20 1101  BP: (!) 168/78  Pulse: (!) 57  Resp: 16  Temp: 98.5 F (36.9 C)  SpO2: 98%   BP Readings from Last 3 Encounters:  02/02/20 (!) 168/78  01/23/20 118/76  12/07/19 138/74   Wt Readings from Last 3 Encounters:  02/02/20 136 lb (61.7 kg)  12/07/19 138 lb 6.4 oz (62.8 kg)  10/03/19 135 lb 9.6 oz (61.5 kg)   Body mass index is 26.56 kg/m.   Physical Exam    Constitutional: Appears well-developed and well-nourished. No distress.  HENT:  Head: Normocephalic and atraumatic.  Neck: Neck supple. No tracheal deviation present. No thyromegaly present.  No cervical lymphadenopathy Cardiovascular: Normal rate, regular rhythm and normal heart sounds.  1/6 systolic murmur heard. No carotid bruit .  No edema  Pulmonary/Chest: Effort normal and breath sounds normal. No respiratory distress. No has no wheezes. No rales.  Skin: Skin is warm and dry. Not diaphoretic.  Psychiatric: Normal mood and affect. Behavior is normal.       Assessment & Plan:    See Problem List for Assessment and Plan of chronic medical problems.    This visit occurred during the SARS-CoV-2 public health emergency.  Safety protocols were in place, including screening questions prior to the visit, additional usage of staff PPE, and extensive cleaning of exam room while observing appropriate contact time as indicated for disinfecting solutions.

## 2020-02-02 ENCOUNTER — Ambulatory Visit (INDEPENDENT_AMBULATORY_CARE_PROVIDER_SITE_OTHER): Payer: Medicare Other | Admitting: Internal Medicine

## 2020-02-02 ENCOUNTER — Encounter: Payer: Self-pay | Admitting: Internal Medicine

## 2020-02-02 ENCOUNTER — Other Ambulatory Visit: Payer: Self-pay

## 2020-02-02 VITALS — BP 168/78 | HR 57 | Temp 98.5°F | Resp 16 | Ht 60.0 in | Wt 136.0 lb

## 2020-02-02 DIAGNOSIS — F418 Other specified anxiety disorders: Secondary | ICD-10-CM | POA: Diagnosis not present

## 2020-02-02 DIAGNOSIS — E038 Other specified hypothyroidism: Secondary | ICD-10-CM

## 2020-02-02 DIAGNOSIS — R030 Elevated blood-pressure reading, without diagnosis of hypertension: Secondary | ICD-10-CM

## 2020-02-02 DIAGNOSIS — K219 Gastro-esophageal reflux disease without esophagitis: Secondary | ICD-10-CM | POA: Diagnosis not present

## 2020-02-02 DIAGNOSIS — E119 Type 2 diabetes mellitus without complications: Secondary | ICD-10-CM | POA: Diagnosis not present

## 2020-02-02 LAB — MICROALBUMIN / CREATININE URINE RATIO
Creatinine,U: 61.1 mg/dL
Microalb Creat Ratio: 1.1 mg/g (ref 0.0–30.0)
Microalb, Ur: <0.7 mg/dL (ref 0.0–1.9)

## 2020-02-02 LAB — COMPREHENSIVE METABOLIC PANEL
ALT: 11 U/L (ref 0–35)
AST: 20 U/L (ref 0–37)
Albumin: 4 g/dL (ref 3.5–5.2)
Alkaline Phosphatase: 86 U/L (ref 39–117)
BUN: 10 mg/dL (ref 6–23)
CO2: 31 mEq/L (ref 19–32)
Calcium: 9.4 mg/dL (ref 8.4–10.5)
Chloride: 101 mEq/L (ref 96–112)
Creatinine, Ser: 0.97 mg/dL (ref 0.40–1.20)
GFR: 54.53 mL/min — ABNORMAL LOW (ref >60.00)
Glucose, Bld: 92 mg/dL (ref 70–99)
Potassium: 4.2 mEq/L (ref 3.5–5.1)
Sodium: 137 mEq/L (ref 135–145)
Total Bilirubin: 0.6 mg/dL (ref 0.2–1.2)
Total Protein: 7.4 g/dL (ref 6.0–8.3)

## 2020-02-02 LAB — TSH: TSH: 1.02 u[IU]/mL (ref 0.35–4.50)

## 2020-02-02 LAB — HEMOGLOBIN A1C: Hgb A1c MFr Bld: 5.9 % (ref 4.6–6.5)

## 2020-02-02 MED ORDER — FAMOTIDINE 40 MG PO TABS: 40.0000 mg | ORAL_TABLET | Freq: Every day | ORAL | 3 refills | Status: DC

## 2020-02-02 MED ORDER — ALPRAZOLAM 0.25 MG PO TABS: 0.2500 mg | ORAL_TABLET | Freq: Every day | ORAL | 1 refills | Status: DC | PRN

## 2020-02-02 NOTE — Assessment & Plan Note (Signed)
Chronic Overall she states her anxiety and depression are controlled, but she still worries a lot She takes the Xanax as needed and only takes it when she absolutely needs it Continue sertraline 100 mg daily and Xanax as needed Xanax refilled today Follow-up in 6 months

## 2020-02-02 NOTE — Assessment & Plan Note (Signed)
Chronic  Clinically euthyroid Check tsh  Titrate med dose if needed  

## 2020-02-02 NOTE — Assessment & Plan Note (Signed)
Neck Diet controlled Check A1c, urine microalbumin Sugars very well controlled at home Follow-up in 6 months

## 2020-02-02 NOTE — Assessment & Plan Note (Signed)
Taking omeprazole 40 mg daily GERD not ideally controlled Start pepcid 40 mg daily

## 2020-02-02 NOTE — Assessment & Plan Note (Signed)
New Blood pressure elevated here today, but typically is well controlled She does have a blood pressure cuff at home, but does not check her blood pressure regularly She will start monitoring her at home and let me know if it is not controlled Not currently on medication for her blood pressure CMP

## 2020-02-13 DIAGNOSIS — C4441 Basal cell carcinoma of skin of scalp and neck: Secondary | ICD-10-CM | POA: Diagnosis not present

## 2020-02-13 DIAGNOSIS — L821 Other seborrheic keratosis: Secondary | ICD-10-CM | POA: Diagnosis not present

## 2020-02-13 DIAGNOSIS — L720 Epidermal cyst: Secondary | ICD-10-CM | POA: Diagnosis not present

## 2020-02-23 DIAGNOSIS — K2101 Gastro-esophageal reflux disease with esophagitis, with bleeding: Secondary | ICD-10-CM | POA: Diagnosis not present

## 2020-02-23 DIAGNOSIS — K5901 Slow transit constipation: Secondary | ICD-10-CM | POA: Diagnosis not present

## 2020-02-26 ENCOUNTER — Other Ambulatory Visit: Payer: Self-pay | Admitting: Internal Medicine

## 2020-02-28 DIAGNOSIS — M0579 Rheumatoid arthritis with rheumatoid factor of multiple sites without organ or systems involvement: Secondary | ICD-10-CM | POA: Diagnosis not present

## 2020-02-28 DIAGNOSIS — Z79899 Other long term (current) drug therapy: Secondary | ICD-10-CM | POA: Diagnosis not present

## 2020-02-28 DIAGNOSIS — Z6823 Body mass index (BMI) 23.0-23.9, adult: Secondary | ICD-10-CM | POA: Diagnosis not present

## 2020-02-28 DIAGNOSIS — R5382 Chronic fatigue, unspecified: Secondary | ICD-10-CM | POA: Diagnosis not present

## 2020-02-28 DIAGNOSIS — R634 Abnormal weight loss: Secondary | ICD-10-CM | POA: Diagnosis not present

## 2020-02-28 DIAGNOSIS — M150 Primary generalized (osteo)arthritis: Secondary | ICD-10-CM | POA: Diagnosis not present

## 2020-02-28 DIAGNOSIS — M2550 Pain in unspecified joint: Secondary | ICD-10-CM | POA: Diagnosis not present

## 2020-03-05 DIAGNOSIS — M1712 Unilateral primary osteoarthritis, left knee: Secondary | ICD-10-CM | POA: Diagnosis not present

## 2020-03-05 DIAGNOSIS — M5030 Other cervical disc degeneration, unspecified cervical region: Secondary | ICD-10-CM | POA: Diagnosis not present

## 2020-03-08 ENCOUNTER — Encounter: Payer: Self-pay | Admitting: Obstetrics and Gynecology

## 2020-03-08 ENCOUNTER — Other Ambulatory Visit: Payer: Self-pay

## 2020-03-08 ENCOUNTER — Ambulatory Visit (INDEPENDENT_AMBULATORY_CARE_PROVIDER_SITE_OTHER): Payer: Medicare Other | Admitting: Obstetrics and Gynecology

## 2020-03-08 VITALS — BP 124/76

## 2020-03-08 DIAGNOSIS — N3001 Acute cystitis with hematuria: Secondary | ICD-10-CM

## 2020-03-08 DIAGNOSIS — B373 Candidiasis of vulva and vagina: Secondary | ICD-10-CM

## 2020-03-08 DIAGNOSIS — R300 Dysuria: Secondary | ICD-10-CM | POA: Diagnosis not present

## 2020-03-08 DIAGNOSIS — N898 Other specified noninflammatory disorders of vagina: Secondary | ICD-10-CM | POA: Diagnosis not present

## 2020-03-08 DIAGNOSIS — B3731 Acute candidiasis of vulva and vagina: Secondary | ICD-10-CM

## 2020-03-08 LAB — WET PREP FOR TRICH, YEAST, CLUE

## 2020-03-08 MED ORDER — FLUCONAZOLE 150 MG PO TABS: 150.0000 mg | ORAL_TABLET | ORAL | 0 refills | Status: AC

## 2020-03-08 MED ORDER — NITROFURANTOIN MONOHYD MACRO 100 MG PO CAPS: 100.0000 mg | ORAL_CAPSULE | Freq: Two times a day (BID) | ORAL | 0 refills | Status: DC

## 2020-03-08 NOTE — Progress Notes (Signed)
   Lisa Vega  18-Jan-1932 [id number]  HPI 86 y.o. [id number] who presents today for vaginal itching and dysuria. History of pelvic relaxation with uterine prolapse, cystocele and rectocele.  She has a ring pessary with support.  She was in for a pessary maintenance 01/23/2020.  She denies any vaginal bleeding or/ unusual vaginal discharge.  Generally has not been feeling the best last few days with abdominal discomfort and feelings of fever, although she did not record a true fever at home. She says she has recently tested negative for Covid.  Physical Exam  BP 124/76   General: Pleasant female, no acute distress, alert and oriented PELVIC EXAM: VULVA: normal appearing vulva with no masses, tenderness or lesions, atrophic changes.  Glistening white discharge noted at the introitus.  Pessary in place in an appropriate position, the device is not removed. Vaginal wet prep positive for hyphae, negative for trichomonas and clue cells.  Moderate WBC, many bacteria, 13-20/hpf epithelial cells. Urinalysis 40-60 WBC, 10-20 RBC, 10-20 squamous epithelial cells, 2+ leukocyte esterase, negative nitrate, negative protein.  Many bacteria.  No yeast or casts. [name] present for the examination.  Assessment 86 yo with pessary in place with acute cystitis and vaginal candidiasis  Plan Fluconazole 150 mg p.o. x2 doses, 72 hours apart Macrobid 100 mg twice daily for 7 days for the UTI. Please let us know if the symptoms do not improve.   Joseph Pierini MD 03/08/20

## 2020-03-10 LAB — URINALYSIS, COMPLETE W/RFL CULTURE
Bilirubin Urine: NEGATIVE
Glucose, UA: NEGATIVE
Hyaline Cast: NONE SEEN /LPF
Ketones, ur: NEGATIVE
Nitrites, Initial: NEGATIVE
Protein, ur: NEGATIVE
Specific Gravity, Urine: 1.005 (ref 1.001–1.03)
pH: 6 (ref 5.0–8.0)

## 2020-03-10 LAB — URINE CULTURE
MICRO NUMBER:: 10245722
SPECIMEN QUALITY:: ADEQUATE

## 2020-03-10 LAB — CULTURE INDICATED

## 2020-03-27 ENCOUNTER — Telehealth: Payer: Self-pay | Admitting: Neurology

## 2020-03-27 NOTE — Telephone Encounter (Signed)
Patient called stating she has been having headaches for the past couple of days and is unsure what she should do about it and would like a cb from RN to discuss

## 2020-03-28 MED ORDER — BACLOFEN 10 MG PO TABS: ORAL_TABLET | ORAL | 0 refills | Status: DC

## 2020-03-28 NOTE — Telephone Encounter (Signed)
LMVM for pt to return call.   

## 2020-03-28 NOTE — Telephone Encounter (Signed)
I spoke to pt.  She cannot take higher dose of gabapentin.  She was ok to try baclofen 5mg  po bid (or 10mg  po qhs).  Sent prescription for 5mg  po bid.  Pt will let us know if this helps or not.

## 2020-03-28 NOTE — Telephone Encounter (Signed)
If gabapentin is helpful, we could try higher dose (last visit, decreased her midday dose).  Another option is possibly baclofen 1-2 times daily for possible cervicogenic headache (could start just at bedtime 10 mg or try 5 mg BID) to see if offers any benefit.

## 2020-03-28 NOTE — Telephone Encounter (Signed)
Pt called back and she is still having the burning sensations noted in last ofv vist note.  She did try taking the gabapentin 200mg  po am and 300mg  PM and no change.  These sensations of burning top, side, back of head, offblance  continue.  I offered another earlier appt in 4-26 but she will be out of town for a month.  Will keep 07/2020 appt already on book.  Any other recs?

## 2020-03-28 NOTE — Addendum Note (Signed)
Addended by: Brandon Melnick on: 03/28/2020 01:45 PM   Modules accepted: Orders

## 2020-04-10 DIAGNOSIS — M5030 Other cervical disc degeneration, unspecified cervical region: Secondary | ICD-10-CM | POA: Diagnosis not present

## 2020-04-10 DIAGNOSIS — M47812 Spondylosis without myelopathy or radiculopathy, cervical region: Secondary | ICD-10-CM | POA: Diagnosis not present

## 2020-04-10 DIAGNOSIS — M1712 Unilateral primary osteoarthritis, left knee: Secondary | ICD-10-CM | POA: Diagnosis not present

## 2020-04-11 ENCOUNTER — Ambulatory Visit: Payer: Medicare Other | Admitting: Pulmonary Disease

## 2020-04-15 ENCOUNTER — Other Ambulatory Visit: Payer: Self-pay

## 2020-04-16 ENCOUNTER — Ambulatory Visit (INDEPENDENT_AMBULATORY_CARE_PROVIDER_SITE_OTHER): Payer: Medicare Other | Admitting: Obstetrics and Gynecology

## 2020-04-16 ENCOUNTER — Encounter: Payer: Self-pay | Admitting: Obstetrics and Gynecology

## 2020-04-16 VITALS — BP 122/76

## 2020-04-16 DIAGNOSIS — Z4689 Encounter for fitting and adjustment of other specified devices: Secondary | ICD-10-CM

## 2020-04-16 DIAGNOSIS — N8189 Other female genital prolapse: Secondary | ICD-10-CM | POA: Diagnosis not present

## 2020-04-16 NOTE — Progress Notes (Signed)
   Lisa Vega  1932-08-03 [id number]  HPI The patient is a 86 y.o. [id number] who presents today for a pessary check.  She was seen on 03/08/2020 for vaginal itching and dysuria, treated for a UTI and vaginal candidiasis and says those symptoms have improved.  She says she occasionally gets of pain in her pubic bone area and also feels that her pessary is interfering with passage of bowel movement sometimes she has to adjust the device as she is having a bowel movement.  She denies any vaginal bleeding or discharge.  Past medical history,surgical history, problem list, medications, allergies, family history and social history were all reviewed and documented as reviewed in the EPIC chart.   Physical Exam  BP 122/76   General: Pleasant female, no acute distress, alert and oriented PELVIC EXAM: VULVA: normal appearing vulva with no masses, tenderness or lesions, VAGINA: normal appearing vagina with normal color and discharge, no lesions, atrophic changes noted.  No erosions.  Bimanual without mass or tenderness appreciated.  Ring pessary removed cleansed and replaced without difficulty. Lisa Vega present for examination  Assessment 86 yo G5P3 here for pessary maintenance due to pelvic relaxation/prolapse  Plan We will consider taking a break from the pessary when she returns to [place] where she is going for her grandson's wedding in the coming weeks.  Normal examination today.  Symptoms improved from her last visit after treatment for vaginal candidiasis and a UTI.  Urged to keep bowel movements soft to avoid problems with passage if this is related to the pessary.  We will see her back in 3 months or sooner if needed.   Lisa Pierini MD 04/16/20

## 2020-04-18 DIAGNOSIS — M47812 Spondylosis without myelopathy or radiculopathy, cervical region: Secondary | ICD-10-CM | POA: Insufficient documentation

## 2020-04-24 ENCOUNTER — Other Ambulatory Visit: Payer: Self-pay | Admitting: Neurology

## 2020-04-24 ENCOUNTER — Other Ambulatory Visit: Payer: Self-pay | Admitting: Pulmonary Disease

## 2020-04-24 ENCOUNTER — Telehealth: Payer: Self-pay | Admitting: Pulmonary Disease

## 2020-04-24 MED ORDER — PREDNISONE 20 MG PO TABS: 20.0000 mg | ORAL_TABLET | Freq: Every day | ORAL | 1 refills | Status: DC

## 2020-04-24 NOTE — Telephone Encounter (Signed)
Prescription for prednisone 20 mg daily for 7 days sent in

## 2020-04-24 NOTE — Telephone Encounter (Signed)
Spoke with pt. States that she needs a prescription for Prednisone. Reports increased shortness of breath, coughing and chest tightness. Cough is non productive. Denies fever/chills, congestion/runny nose, sore throat, severe headache, joint pain, unexplained muscle aches, loss of taste or smell, rash, N/V/D, abdominal pain, redness around/in the eye, increased weakness or unexplained bruising or bleeding.  Symptoms started 2-3 days ago. Offered pt a televisit since she is not capable of doing a video visit, she declined.  Dr. Ander Slade - please advise. Thanks.

## 2020-04-25 NOTE — Telephone Encounter (Signed)
Called and spoke with pt letting her know that AO sent prednisone rx to pharmacy for her. She verbalized understanding. Nothing further needed.

## 2020-04-30 ENCOUNTER — Other Ambulatory Visit: Payer: Self-pay | Admitting: Neurology

## 2020-05-02 ENCOUNTER — Encounter: Payer: Self-pay | Admitting: Pulmonary Disease

## 2020-05-02 ENCOUNTER — Ambulatory Visit (INDEPENDENT_AMBULATORY_CARE_PROVIDER_SITE_OTHER): Payer: Medicare Other | Admitting: Pulmonary Disease

## 2020-05-02 ENCOUNTER — Other Ambulatory Visit: Payer: Self-pay

## 2020-05-02 ENCOUNTER — Ambulatory Visit (INDEPENDENT_AMBULATORY_CARE_PROVIDER_SITE_OTHER): Payer: Medicare Other

## 2020-05-02 VITALS — BP 128/56 | HR 60 | Temp 97.3°F | Ht 64.25 in | Wt 138.2 lb

## 2020-05-02 DIAGNOSIS — J441 Chronic obstructive pulmonary disease with (acute) exacerbation: Secondary | ICD-10-CM | POA: Diagnosis not present

## 2020-05-02 DIAGNOSIS — J449 Chronic obstructive pulmonary disease, unspecified: Secondary | ICD-10-CM | POA: Diagnosis not present

## 2020-05-02 MED ORDER — DOXYCYCLINE HYCLATE 100 MG PO TABS: 100.0000 mg | ORAL_TABLET | Freq: Two times a day (BID) | ORAL | 0 refills | Status: DC

## 2020-05-02 MED ORDER — PREDNISONE 10 MG PO TABS: 10.0000 mg | ORAL_TABLET | Freq: Two times a day (BID) | ORAL | 0 refills | Status: DC

## 2020-05-02 NOTE — Patient Instructions (Signed)
COPD with exacerbation  Doxycycline 100 p.o. twice daily for 7 days  Prednisone 10 p.o. twice daily for 10 days Obtain chest x-ray today   I will see you as follow-up in 3 months  Call with significant concerns

## 2020-05-02 NOTE — Progress Notes (Signed)
Lisa Vega    [id number]    [birth year]  Primary Care Physician:Vega, Lisa Lick, MD  Referring Physician: Binnie Rail, MD [place],  [place] [address]  Chief complaint:  Patient with a history of COPD Has had a rough couple of weeks  HPI: Prescription for prednisone was recently called in for worsening shortness of breath Feels a little bit better in the last 24 hours but still feeling very fatigued Cough, chest tightness  Recent exacerbation has lasted about 2 weeks She is compliant with Symbicort, Spiriva and pro-air which she uses rarely  History of rheumatoid arthritis for which is not been on methotrexate chronically  Breathing has been relatively stable She is not having to restrict activities  She does not perform any graded exercises on a regular basis, tries to walk regularly  She is not planning to receive Covid vaccination- we did talk about this today  Outpatient Encounter Medications as of 05/02/2020  Medication Sig  . acetaminophen (TYLENOL) 325 MG tablet Take 650 mg by mouth every 6 (six) hours as needed for mild pain.   Marland Kitchen albuterol (PROAIR HFA) 108 (90 Base) MCG/ACT inhaler USE 1-2 PUFFS EVERY 4-6 HOURS AS NEEDED  . ALPRAZolam (XANAX) 0.25 MG tablet Take 1 tablet (0.25 mg total) by mouth daily as needed. for anxiety  . aspirin EC 81 MG tablet Take 81 mg by mouth daily.  . blood glucose meter kit and supplies KIT Dispense based on patient and insurance preference. Use up to four times daily as directed. (FOR E11.9).  . budesonide-formoterol (SYMBICORT) 160-4.5 MCG/ACT inhaler Inhale 2 puffs into the lungs 2 (two) times daily.  . famotidine (PEPCID) 40 MG tablet Take 1 tablet (40 mg total) by mouth daily.  . folic acid (FOLVITE) 1 MG tablet Take 1 mg by mouth daily.  Marland Kitchen gabapentin (NEURONTIN) 100 MG capsule Take 2 in the morning, take 3 at bedtime (Patient taking differently: 100 mg 3 (three) times daily. Take 2 in the morning, take  3 at bedtime)  . glucose blood (ONETOUCH ULTRA) test strip Use to check blood sugars twice a day  . ibuprofen (ADVIL,MOTRIN) 200 MG tablet Take 200 mg by mouth every 6 (six) hours as needed for headache or mild pain.  . meclizine (ANTIVERT) 25 MG tablet Take 0.5-1 tablets (12.5-25 mg total) by mouth 3 (three) times daily as needed for dizziness.  . methotrexate 50 MG/2ML injection Inject 17.5 mg into the skin every Sunday. Patient is not taking it Sunday, October 13- patient reported  . mupirocin ointment (BACTROBAN) 2 % Place 1 application into the nose 2 (two) times daily.  . nitrofurantoin, macrocrystal-monohydrate, (MACROBID) 100 MG capsule Take 1 capsule (100 mg total) by mouth 2 (two) times daily.  . NONFORMULARY OR COMPOUNDED ITEM Estradiol .02% 1 ML Prefilled Applicator Sig: apply vaginally twice a week #90 Day Supply with 4 refills  . omeprazole (PRILOSEC) 40 MG capsule Take 40 mg by mouth daily.  Lisa Vega DELICA LANCETS 62U MISC USE TO CHECK SUGARS UP TO 4 TIMES A DAY  . predniSONE (DELTASONE) 20 MG tablet Take 1 tablet (20 mg total) by mouth daily with breakfast.  . sertraline (ZOLOFT) 100 MG tablet TAKE 1 TABLET BY MOUTH EVERY DAY  . SYNTHROID 88 MCG tablet TAKE 1 TABLET DAILY  . Tiotropium Bromide Monohydrate (SPIRIVA RESPIMAT) 2.5 MCG/ACT AERS Inhale 2 puffs into the lungs daily.  . traMADol (ULTRAM) 50 MG tablet  Take 50 mg by mouth 3 (three) times daily as needed for moderate pain.   Marland Kitchen triamcinolone cream (KENALOG) 0.1 % Apply 1 application topically daily as needed. Thin layer on vulva as needed (Patient taking differently: Apply 1 application topically daily as needed (vulva). )  . baclofen (LIORESAL) 10 MG tablet TAKE 1/2 TABLET BY MOUTH TWICE A DAY (Patient not taking: Reported on 05/02/2020)  . linaclotide (LINZESS) 72 MCG capsule Take 72 mcg by mouth daily as needed (constipation).    No facility-administered encounter medications on file as of 05/02/2020.    Allergies as  of 05/02/2020 - Review Complete 05/02/2020  Allergen Reaction Noted  . Codeine    . Demerol  08/10/2011  . Meperidine hcl      Past Medical History:  Diagnosis Date  . Anxiety   . Arthritis    RA  . BPV (benign positional vertigo) 03/10/2018  . Cancer ([place])    lung ca  . Carotid stenosis, left 09/19/2018  . COPD (chronic obstructive pulmonary disease) ([place])   . Depression   . Dyspnea    with exertion  . GERD (gastroesophageal reflux disease)   . Headache syndrome 03/09/2019  . Hypercholesteremia   . Hypothyroidism   . Memory difficulties 10/28/2015  . Pneumonia     x3  last time 2017  . Pre-diabetes   . Rheumatoid arthritis(714.0)   . Thyroid disease   . Tremor 10/28/2015   Jaw tremor  . Uterine prolapse   . Vitamin D deficiency     Past Surgical History:  Procedure Laterality Date  . ANGIOPLASTY Left 10/10/2018   Procedure: ANGIOPLASTY using 1cm x 14cm Xenosure Patch;  Surgeon: Lisa Heck, MD;  Location: [facility];  Service: Vascular;  Laterality: Left;  . APPENDECTOMY    . COLONOSCOPY W/ POLYPECTOMY    . ENDARTERECTOMY Left 10/10/2018   Procedure: ENDARTERECTOMY CAROTID;  Surgeon: Lisa Heck, MD;  Location: [facility];  Service: Vascular;  Laterality: Left;  . EYE SURGERY Bilateral    cataract  . FOOT SURGERY Bilateral    arthritis  . Hands  Bilateral    Arthritis  . HEMORRHOID SURGERY    . right lobectomy  10/2004  . TONSILLECTOMY AND ADENOIDECTOMY      Family History  Problem Relation Age of Onset  . Emphysema Mother   . Asthma Mother   . Lung cancer Father   . Pancreatic cancer Father   . Bone cancer Father   . Heart disease Brother        x1  . Heart disease Sister        x2  . Stroke Maternal Grandfather     Social History   Socioeconomic History  . Marital status: Widowed    Spouse name: Not on file  . Number of children: 3  . Years of education: LPN  . Highest education level: Not on file  Occupational History  .  Occupation: retired    Fish farm manager: RETIRED    Comment: LPN at [employer]  Tobacco Use  . Smoking status: Former Smoker    Packs/day: 1.00    Years: 20.00    Pack years: 20.00    Types: Cigarettes    Quit date: 11/10/2004    Years since quitting: 15.4  . Smokeless tobacco: Never Used  Substance and Sexual Activity  . Alcohol use: Yes    Alcohol/week: 1.0 standard drinks    Types: 1 Glasses of wine per week  Comment: socially  . Drug use: No  . Sexual activity: Not Currently    Comment: 1st intercourse- 19, partners- 5,   Other Topics Concern  . Not on file  Social History Narrative   Patient drinks about 3 cups of coffee daily.   Patient is right handed.    Social Determinants of Health   Financial Resource Strain:   . Difficulty of Paying Living Expenses:   Food Insecurity:   . Worried About Charity fundraiser in the Last Year:   . Arboriculturist in the Last Year:   Transportation Needs:   . Film/video editor (Medical):   Marland Kitchen Lack of Transportation (Non-Medical):   Physical Activity:   . Days of Exercise per Week:   . Minutes of Exercise per Session:   Stress:   . Feeling of Stress :   Social Connections:   . Frequency of Communication with Friends and Family:   . Frequency of Social Gatherings with Friends and Family:   . Attends Religious Services:   . Active Member of Clubs or Organizations:   . Attends Archivist Meetings:   Marland Kitchen Marital Status:   Intimate Partner Violence:   . Fear of Current or Ex-Partner:   . Emotionally Abused:   Marland Kitchen Physically Abused:   . Sexually Abused:     Review of Systems  Constitutional: Negative.   HENT: Negative.   Eyes: Negative.   Respiratory: Negative.  Negative for apnea, cough and shortness of breath.   Cardiovascular: Negative for chest pain and leg swelling.  Gastrointestinal: Negative.   Endocrine: Negative.   Genitourinary: Negative.     Vitals:   05/02/20 1402  BP: (!) 128/56    Pulse: 60  Temp: (!) 97.3 F (36.3 C)  SpO2: 94%     Physical Exam  Constitutional: She appears well-developed and well-nourished.  HENT:  Head: Normocephalic and atraumatic.  Eyes: Pupils are equal, round, and reactive to light.  Neck: No tracheal deviation present. No thyromegaly present.  Cardiovascular: Normal rate.  Pulmonary/Chest: Effort normal and breath sounds normal. No respiratory distress. She has no wheezes. She has no rales. She exhibits no tenderness.   Data Reviewed: Last PFT was 08/19/2018-mild obstructive lung disease  Assessment:  COPD -With exacerbation -Doxycycline 100 p.o. twice daily for 7 days -Prednisone 10 p.o. twice daily for 5 to 7 days  Rheumatoid arthritis -Chronically on methotrexate  Plan/Recommendations: Obtain chest x-ray  Continue bronchodilators  Call with significant concerns  I will see her back in the office in about 6 months   Sherrilyn Rist MD [facility] 05/02/2020, 2:09 PM  CC: Lisa Rail, MD

## 2020-05-06 ENCOUNTER — Telehealth: Payer: Self-pay | Admitting: Pulmonary Disease

## 2020-05-06 NOTE — Telephone Encounter (Signed)
Chest xray is stable from previous, no new changes

## 2020-05-06 NOTE — Telephone Encounter (Signed)
Advised pt of results. Pt understood and nothing further is needed.   

## 2020-05-06 NOTE — Telephone Encounter (Signed)
Called and spoke with pt who is requesting to know the results of her cxr. Dr. Jenetta Downer, please advise.

## 2020-05-10 ENCOUNTER — Other Ambulatory Visit: Payer: Self-pay | Admitting: Pulmonary Disease

## 2020-05-16 ENCOUNTER — Telehealth: Payer: Self-pay | Admitting: Neurology

## 2020-05-16 NOTE — Telephone Encounter (Signed)
Pt called to advise what she should do about burning headaches she has been experiencing states  she has been experiencing  Shortness of breath even after using inhaler from COPD. Pt would like to know what MD recommends. Pt states issues have been going on for months.   Advised if needing immediate attention to go to ED or UC

## 2020-05-18 DIAGNOSIS — M47812 Spondylosis without myelopathy or radiculopathy, cervical region: Secondary | ICD-10-CM | POA: Diagnosis not present

## 2020-05-20 NOTE — Telephone Encounter (Signed)
I called pt. She reports that she has had neck injections by Dr. Nelva Bush which she hopes will help her headaches. I offered her an appt with Judson Roch, NP but pt declined and said that if her headaches are not better by next week from the injections she will call us for an appt.  I also discussed SoB with the pt. If pt has SoB and it is not helped by an inhaler she really should proceed to the ER ASAP. I encouraged pt to also discuss this with her pulmonology team. Pt verbalized understanding.

## 2020-05-30 ENCOUNTER — Telehealth: Payer: Self-pay | Admitting: Internal Medicine

## 2020-05-30 NOTE — Progress Notes (Signed)
°  Chronic Care Management   Outreach Note  05/30/2020 Name: Lisa Vega MRN: [id number] DOB: 12-Sep-1932  Referred by: Binnie Rail, MD Reason for referral : No chief complaint on file.   An unsuccessful telephone outreach was attempted today. The patient was referred to the pharmacist for assistance with care management and care coordination.   This note is not being shared with the patient for the following reason: To respect privacy (The patient or proxy has requested that the information not be shared).  Follow Up Plan:   Earney Hamburg Upstream Scheduler

## 2020-06-10 ENCOUNTER — Telehealth: Payer: Self-pay | Admitting: Internal Medicine

## 2020-06-10 NOTE — Progress Notes (Signed)
  Chronic Care Management   Outreach Note  06/10/2020 Name: Lisa Vega MRN: [id number] DOB: 01-26-1932  Referred by: Binnie Rail, MD Reason for referral : No chief complaint on file.   An unsuccessful telephone outreach was attempted today. The patient was referred to the pharmacist for assistance with care management and care coordination. This note is not being shared with the patient for the following reason: To respect privacy (The patient or proxy has requested that the information not be shared).  Follow Up Plan:   Earney Hamburg Upstream Scheduler

## 2020-06-12 ENCOUNTER — Other Ambulatory Visit: Payer: Self-pay

## 2020-06-12 MED ORDER — GABAPENTIN 100 MG PO CAPS: ORAL_CAPSULE | ORAL | 3 refills | Status: DC

## 2020-06-24 ENCOUNTER — Telehealth: Payer: Self-pay | Admitting: Internal Medicine

## 2020-06-24 NOTE — Progress Notes (Signed)
Subjective:    Patient ID: Lisa Vega, female    DOB: 04/02/1932, 86 y.o.   MRN: [id number]  HPI The patient is here for an acute visit.   Eye irritation:  Her eye symptoms started last Thursday.  She denies injury.   Friday she was not able to see as well because the right upper eyelid was swollen.  She has a bump that is tender in her medial upper eyelid.  Her upper eyelid is red, swollen.  Her left conjunctiva is erythematous.  She has crust in medial aspect of her eye.  Left side of nose is painful.  The whole area feel itchy, burning and tender.     She has some nausea,  Chronic burning headaches wihtout change.  She denies f/c.     Medications and allergies reviewed with patient and updated if appropriate.  Patient Active Problem List   Diagnosis Date Noted  . Elevated blood pressure reading 02/02/2020  . RUQ pain 08/01/2019  . Right flank pain 08/01/2019  . Headache syndrome 03/09/2019  . Upper respiratory tract infection 01/23/2019  . Carotid stenosis, left 09/19/2018  . Benign positional vertigo 03/10/2018  . History of lung cancer 11/29/2017  . Difficulty sleeping 09/22/2017  . Diabetes ([place]) 05/13/2017  . Fatigue 05/12/2017  . CAP (community acquired pneumonia) 05/03/2017  . Orthopnea 01/12/2017  . GERD (gastroesophageal reflux disease) 11/09/2016  . Osteoporosis 05/06/2016  . Depression with anxiety 05/06/2016  . Hiatal hernia 05/06/2016  . Hypothyroidism 05/06/2016  . Tremor 10/28/2015  . Memory difficulties 10/28/2015  . Hoarseness 10/14/2015  . History of vitamin D deficiency 05/02/2015  . 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 ([place]) 05/30/2014  . Diverticulitis of colon without hemorrhage 08/07/2013  . Prolapse of female pelvic organs 06/22/2013  . Neck pain 03/10/2013  . Vaginal atrophy 03/09/2013  . Rectocele 03/09/2013  . Cystocele 03/09/2013  . Urinary incontinence 03/09/2013  . COPD (chronic obstructive pulmonary disease)  ([place]) 01/16/2011  . CARCINOMA, LUNG, NONSMALL CELL 12/17/2010  . Rheumatoid arthritis ([place]) 12/17/2010  . Dyspnea on exertion 12/17/2010    Current Outpatient Medications on File Prior to Visit  Medication Sig Dispense Refill  . acetaminophen (TYLENOL) 325 MG tablet Take 650 mg by mouth every 6 (six) hours as needed for mild pain.     Marland Kitchen albuterol (PROAIR HFA) 108 (90 Base) MCG/ACT inhaler USE 1-2 PUFFS EVERY 4-6 HOURS AS NEEDED 8.5 g 5  . ALPRAZolam (XANAX) 0.25 MG tablet Take 1 tablet (0.25 mg total) by mouth daily as needed. for anxiety 20 tablet 1  . aspirin EC 81 MG tablet Take 81 mg by mouth daily.    . baclofen (LIORESAL) 10 MG tablet TAKE 1/2 TABLET BY MOUTH TWICE A DAY 90 tablet 0  . blood glucose meter kit and supplies KIT Dispense based on patient and insurance preference. Use up to four times daily as directed. (FOR E11.9). 1 each 0  . budesonide-formoterol (SYMBICORT) 160-4.5 MCG/ACT inhaler USE 2 INHALATIONS ORALLY   TWICE DAILY 30.6 g 2  . clidinium-chlordiazePOXIDE (LIBRAX) 5-2.5 MG capsule SMARTSIG:1 Capsule(s) By Mouth Every 12 Hours PRN    . famotidine (PEPCID) 40 MG tablet Take 1 tablet (40 mg total) by mouth daily. 90 tablet 3  . folic acid (FOLVITE) 1 MG tablet Take 1 mg by mouth daily.    Marland Kitchen gabapentin (NEURONTIN) 100 MG capsule Take 2 in the morning, take 3 at bedtime 450 capsule 3  . glucose blood (ONETOUCH ULTRA)  test strip Use to check blood sugars twice a day 100 each 5  . ibuprofen (ADVIL,MOTRIN) 200 MG tablet Take 200 mg by mouth every 6 (six) hours as needed for headache or mild pain.    Marland Kitchen linaclotide (LINZESS) 72 MCG capsule Take 72 mcg by mouth daily as needed (constipation).     . meclizine (ANTIVERT) 25 MG tablet Take 0.5-1 tablets (12.5-25 mg total) by mouth 3 (three) times daily as needed for dizziness. 30 tablet 5  . methotrexate 50 MG/2ML injection Inject 17.5 mg into the skin every Sunday. Patient is not taking it Sunday, October 13- patient reported  1    . mupirocin ointment (BACTROBAN) 2 % Place 1 application into the nose 2 (two) times daily. 22 g 1  . NONFORMULARY OR COMPOUNDED ITEM Estradiol .02% 1 ML Prefilled Applicator Sig: apply vaginally twice a week #90 Day Supply with 4 refills 1 each 0  . omeprazole (PRILOSEC) 40 MG capsule Take 40 mg by mouth daily.    [name] DELICA LANCETS 50I MISC USE TO CHECK SUGARS UP TO 4 TIMES A DAY  0  . predniSONE (DELTASONE) 10 MG tablet Take 1 tablet (10 mg total) by mouth 2 (two) times daily with a meal. 20 tablet 0  . predniSONE (DELTASONE) 20 MG tablet Take 1 tablet (20 mg total) by mouth daily with breakfast. 7 tablet 1  . sertraline (ZOLOFT) 100 MG tablet TAKE 1 TABLET BY MOUTH EVERY DAY 90 tablet 1  . SYNTHROID 88 MCG tablet TAKE 1 TABLET DAILY 90 tablet 1  . Tiotropium Bromide Monohydrate (SPIRIVA RESPIMAT) 2.5 MCG/ACT AERS Inhale 2 puffs into the lungs daily. 12 g 1  . traMADol (ULTRAM) 50 MG tablet Take 50 mg by mouth 3 (three) times daily as needed for moderate pain.   0  . triamcinolone cream (KENALOG) 0.1 % Apply 1 application topically daily as needed. Thin layer on vulva as needed (Patient taking differently: Apply 1 application topically daily as needed (vulva). ) 30 g 3   No current facility-administered medications on file prior to visit.    Past Medical History:  Diagnosis Date  . Anxiety   . Arthritis    RA  . BPV (benign positional vertigo) 03/10/2018  . Cancer ([place])    lung ca  . Carotid stenosis, left 09/19/2018  . COPD (chronic obstructive pulmonary disease) ([place])   . Depression   . Dyspnea    with exertion  . GERD (gastroesophageal reflux disease)   . Headache syndrome 03/09/2019  . Hypercholesteremia   . Hypothyroidism   . Memory difficulties 10/28/2015  . Pneumonia     x3  last time 2017  . Pre-diabetes   . Rheumatoid arthritis(714.0)   . Thyroid disease   . Tremor 10/28/2015   Jaw tremor  . Uterine prolapse   . Vitamin D deficiency     Past Surgical  History:  Procedure Laterality Date  . ANGIOPLASTY Left 10/10/2018   Procedure: ANGIOPLASTY using 1cm x 14cm Xenosure Patch;  Surgeon: Marty Heck, MD;  Location: [facility];  Service: Vascular;  Laterality: Left;  . APPENDECTOMY    . COLONOSCOPY W/ POLYPECTOMY    . ENDARTERECTOMY Left 10/10/2018   Procedure: ENDARTERECTOMY CAROTID;  Surgeon: Marty Heck, MD;  Location: [facility];  Service: Vascular;  Laterality: Left;  . EYE SURGERY Bilateral    cataract  . FOOT SURGERY Bilateral    arthritis  . Hands  Bilateral    Arthritis  .  HEMORRHOID SURGERY    . right lobectomy  10/2004  . TONSILLECTOMY AND ADENOIDECTOMY      Social History   Socioeconomic History  . Marital status: Widowed    Spouse name: Not on file  . Number of children: 3  . Years of education: LPN  . Highest education level: Not on file  Occupational History  . Occupation: retired    Fish farm manager: RETIRED    Comment: LPN at [employer]  Tobacco Use  . Smoking status: Former Smoker    Packs/day: 1.00    Years: 20.00    Pack years: 20.00    Types: Cigarettes    Quit date: 11/10/2004    Years since quitting: 15.6  . Smokeless tobacco: Never Used  Vaping Use  . Vaping Use: Never used  Substance and Sexual Activity  . Alcohol use: Yes    Alcohol/week: 1.0 standard drink    Types: 1 Glasses of wine per week    Comment: socially  . Drug use: No  . Sexual activity: Not Currently    Comment: 1st intercourse- 19, partners- 5,   Other Topics Concern  . Not on file  Social History Narrative   Patient drinks about 3 cups of coffee daily.   Patient is right handed.    Social Determinants of Health   Financial Resource Strain:   . Difficulty of Paying Living Expenses:   Food Insecurity:   . Worried About Charity fundraiser in the Last Year:   . Arboriculturist in the Last Year:   Transportation Needs:   . Film/video editor (Medical):   Marland Kitchen Lack of Transportation (Non-Medical):     Physical Activity:   . Days of Exercise per Week:   . Minutes of Exercise per Session:   Stress:   . Feeling of Stress :   Social Connections:   . Frequency of Communication with Friends and Family:   . Frequency of Social Gatherings with Friends and Family:   . Attends Religious Services:   . Active Member of Clubs or Organizations:   . Attends Archivist Meetings:   Marland Kitchen Marital Status:     Family History  Problem Relation Age of Onset  . Emphysema Mother   . Asthma Mother   . Lung cancer Father   . Pancreatic cancer Father   . Bone cancer Father   . Heart disease Brother        x1  . Heart disease Sister        x2  . Stroke Maternal Grandfather     Review of Systems  Constitutional: Negative for chills and fever.  Eyes: Positive for photophobia, discharge, redness and itching. Negative for pain and visual disturbance.  Neurological: Positive for headaches (chronic).       Objective:   Vitals:   06/25/20 1451  BP: 118/72  Pulse: 71  Temp: 98.2 F (36.8 C)  SpO2: 96%   BP Readings from Last 3 Encounters:  06/25/20 118/72  05/02/20 (!) 128/56  04/16/20 122/76   Wt Readings from Last 3 Encounters:  06/25/20 138 lb (62.6 kg)  05/02/20 138 lb 3.2 oz (62.7 kg)  02/02/20 136 lb (61.7 kg)   Body mass index is 23.5 kg/m.   Physical Exam Constitutional:      General: She is not in acute distress.    Appearance: Normal appearance. She is not ill-appearing.  Eyes:     General:  Left eye: Discharge (crusted in medial corner of eye) present.    Extraocular Movements: Extraocular movements intact.     Comments: Right upper eye lid red, mild swelling, palpable bump in medial upper eye lid that is tender.  Tenderness left side of nose and under eye  Neurological:     Mental Status: She is alert.            Assessment & Plan:    See Problem List for Assessment and Plan of chronic medical problems.    This visit occurred during the  SARS-CoV-2 public health emergency.  Safety protocols were in place, including screening questions prior to the visit, additional usage of staff PPE, and extensive cleaning of exam room while observing appropriate contact time as indicated for disinfecting solutions.

## 2020-06-24 NOTE — Progress Notes (Signed)
  Chronic Care Management   Note  06/24/2020 Name: Lisa Vega MRN: [id number] DOB: 12-14-1932  Lisa Vega is a 86 y.o. year old female who is a primary care patient of Burns, [name], MD. I reached out to Lisa Vega by phone today in response to a referral sent by [name]'s PCP, Binnie Rail, MD.   [name] was given information about Chronic Care Management services today including:  1. CCM service includes personalized support from designated clinical staff supervised by her physician, including individualized plan of care and coordination with other care providers 2. 24/7 contact phone numbers for assistance for urgent and routine care needs. 3. Service will only be billed when office clinical staff spend 20 minutes or more in a month to coordinate care. 4. Only one practitioner may furnish and bill the service in a calendar month. 5. The patient may stop CCM services at any time (effective at the end of the month) by phone call to the office staff.   Patient agreed to services and verbal consent obtained.  This note is not being shared with the patient for the following reason: To respect privacy (The patient or proxy has requested that the information not be shared).  Follow up plan:   Earney Hamburg Upstream Scheduler

## 2020-06-25 ENCOUNTER — Ambulatory Visit (INDEPENDENT_AMBULATORY_CARE_PROVIDER_SITE_OTHER): Payer: Medicare Other | Admitting: Internal Medicine

## 2020-06-25 ENCOUNTER — Encounter: Payer: Self-pay | Admitting: Internal Medicine

## 2020-06-25 ENCOUNTER — Other Ambulatory Visit: Payer: Self-pay

## 2020-06-25 DIAGNOSIS — H109 Unspecified conjunctivitis: Secondary | ICD-10-CM | POA: Insufficient documentation

## 2020-06-25 DIAGNOSIS — H00014 Hordeolum externum left upper eyelid: Secondary | ICD-10-CM | POA: Diagnosis not present

## 2020-06-25 DIAGNOSIS — H1032 Unspecified acute conjunctivitis, left eye: Secondary | ICD-10-CM | POA: Diagnosis not present

## 2020-06-25 DIAGNOSIS — H00019 Hordeolum externum unspecified eye, unspecified eyelid: Secondary | ICD-10-CM | POA: Insufficient documentation

## 2020-06-25 MED ORDER — DOXYCYCLINE HYCLATE 100 MG PO TABS: 100.0000 mg | ORAL_TABLET | Freq: Two times a day (BID) | ORAL | 0 refills | Status: DC

## 2020-06-25 MED ORDER — POLYMYXIN B-TRIMETHOPRIM 10000-0.1 UNIT/ML-% OP SOLN: 2.0000 [drp] | OPHTHALMIC | 0 refills | Status: AC

## 2020-06-25 NOTE — Assessment & Plan Note (Signed)
Acute With associated cellulitis of left upper eye lid, conjunctivitis Warm compresses Doxycycline 100 mg BID, polytrim eye drops  If no improvement see eye doctor by end of week

## 2020-06-25 NOTE — Assessment & Plan Note (Addendum)
Acute With associated cellulitis of left upper eye lid, stye Warm compresses Doxycycline 100 mg BID, polytrim eye drops  If no improvement see eye doctor by end of week - discussed there could be other inflammatory conditions causing some of her symptoms esp given her RA

## 2020-06-25 NOTE — Patient Instructions (Signed)
Use the eye drops for 7 days and take the oral antibiotic.   If there is no improvement see your eye doctor.

## 2020-07-10 DIAGNOSIS — L718 Other rosacea: Secondary | ICD-10-CM | POA: Diagnosis not present

## 2020-07-10 DIAGNOSIS — H18519 Endothelial corneal dystrophy, unspecified eye: Secondary | ICD-10-CM | POA: Diagnosis not present

## 2020-07-10 DIAGNOSIS — Z961 Presence of intraocular lens: Secondary | ICD-10-CM | POA: Diagnosis not present

## 2020-07-10 DIAGNOSIS — H0014 Chalazion left upper eyelid: Secondary | ICD-10-CM | POA: Diagnosis not present

## 2020-07-16 ENCOUNTER — Ambulatory Visit: Payer: Medicare Other | Admitting: Obstetrics and Gynecology

## 2020-07-17 DIAGNOSIS — M5030 Other cervical disc degeneration, unspecified cervical region: Secondary | ICD-10-CM | POA: Diagnosis not present

## 2020-07-17 DIAGNOSIS — M1712 Unilateral primary osteoarthritis, left knee: Secondary | ICD-10-CM | POA: Diagnosis not present

## 2020-07-17 DIAGNOSIS — M47812 Spondylosis without myelopathy or radiculopathy, cervical region: Secondary | ICD-10-CM | POA: Diagnosis not present

## 2020-07-22 DIAGNOSIS — Z79899 Other long term (current) drug therapy: Secondary | ICD-10-CM | POA: Diagnosis not present

## 2020-07-22 DIAGNOSIS — R5382 Chronic fatigue, unspecified: Secondary | ICD-10-CM | POA: Diagnosis not present

## 2020-07-22 DIAGNOSIS — Z6823 Body mass index (BMI) 23.0-23.9, adult: Secondary | ICD-10-CM | POA: Diagnosis not present

## 2020-07-22 DIAGNOSIS — M2550 Pain in unspecified joint: Secondary | ICD-10-CM | POA: Diagnosis not present

## 2020-07-22 DIAGNOSIS — M0579 Rheumatoid arthritis with rheumatoid factor of multiple sites without organ or systems involvement: Secondary | ICD-10-CM | POA: Diagnosis not present

## 2020-07-22 DIAGNOSIS — M150 Primary generalized (osteo)arthritis: Secondary | ICD-10-CM | POA: Diagnosis not present

## 2020-07-25 ENCOUNTER — Ambulatory Visit: Payer: Medicare Other | Admitting: Obstetrics and Gynecology

## 2020-07-29 ENCOUNTER — Ambulatory Visit (INDEPENDENT_AMBULATORY_CARE_PROVIDER_SITE_OTHER): Payer: Medicare Other | Admitting: Obstetrics and Gynecology

## 2020-07-29 ENCOUNTER — Encounter: Payer: Self-pay | Admitting: Obstetrics and Gynecology

## 2020-07-29 ENCOUNTER — Other Ambulatory Visit: Payer: Self-pay

## 2020-07-29 ENCOUNTER — Other Ambulatory Visit: Payer: Self-pay | Admitting: Internal Medicine

## 2020-07-29 VITALS — BP 120/76

## 2020-07-29 DIAGNOSIS — M545 Low back pain, unspecified: Secondary | ICD-10-CM

## 2020-07-29 DIAGNOSIS — R8290 Unspecified abnormal findings in urine: Secondary | ICD-10-CM | POA: Diagnosis not present

## 2020-07-29 DIAGNOSIS — Z4689 Encounter for fitting and adjustment of other specified devices: Secondary | ICD-10-CM

## 2020-07-29 DIAGNOSIS — N8189 Other female genital prolapse: Secondary | ICD-10-CM

## 2020-07-29 MED ORDER — TRIAMCINOLONE ACETONIDE 0.1 % EX OINT: 1.0000 "application " | TOPICAL_OINTMENT | Freq: Two times a day (BID) | CUTANEOUS | 3 refills | Status: DC | PRN

## 2020-07-29 NOTE — Progress Notes (Signed)
   Lisa Vega 12/20/1932 [id number]  SUBJECTIVE:  86 y.o. [id number] female presents for pessary maintenance.  Last in 04/16/2020.  Some lower abdominal pain also notes an abnormal urinary odor.  She denies any increased frequency or pain with urination.  She denies any vaginal discharge or bleeding.  She does feel that she is having a little bit more trouble voiding and feels that her prolapse sometimes slips around the pessary.  She has had the current ring pessary with support for many years now.  Allergies: Codeine, Demerol, and Meperidine hcl  No LMP recorded. Patient is postmenopausal.  Past medical history,surgical history, problem list, medications, allergies, family history and social history were all reviewed and documented as reviewed in the EPIC chart.   OBJECTIVE:  BP 120/76 (BP Location: Right Arm, Patient Position: Sitting, Cuff Size: Normal)  The patient appears well, alert, oriented x 3, in no distress. Abdomen: Nontender, soft, nondistended PELVIC EXAM: VULVA: normal appearing vulva with no masses, tenderness or lesions, VAGINA: normal appearing vagina with normal color and discharge, no lesions, atrophic changes noted.  Diffuse prolapse with moderate or greater cystocele and cervical prolapse. No erosions.  Bimanual without mass or tenderness appreciated. Ring pessary is removed without difficulty.  It is not replaced today.  Chaperone: KimAlexis Bonham present during the examination  ASSESSMENT:  86 y.o. [id number] with pelvic relaxation/prolapse here for pessary maintenance  PLAN:  She will take another break from the pessary at this time since she has been feeling a little bit of difficulty with urination.  Vaginal exam is normal today and the prolapse appears stable from previous exams.  We discussed that if she is feeling more the prolapse around the ring pessary, we may need to consider a space filling pessary instead.  I told her to come back for a new pessary fitting  in about 3 to 4 weeks.  She is only able to produce enough urine sample today to run a culture so that is pending at this time.  She will let us know if any worsening symptoms/fever in the interim.   Joseph Pierini MD 07/29/20

## 2020-07-31 LAB — URINE CULTURE
MICRO NUMBER:: 10775984
Result:: NO GROWTH
SPECIMEN QUALITY:: ADEQUATE

## 2020-08-05 ENCOUNTER — Encounter: Payer: Self-pay | Admitting: Obstetrics and Gynecology

## 2020-08-05 ENCOUNTER — Ambulatory Visit (INDEPENDENT_AMBULATORY_CARE_PROVIDER_SITE_OTHER): Payer: Medicare Other | Admitting: Obstetrics and Gynecology

## 2020-08-05 ENCOUNTER — Other Ambulatory Visit: Payer: Self-pay

## 2020-08-05 VITALS — BP 118/76

## 2020-08-05 DIAGNOSIS — R300 Dysuria: Secondary | ICD-10-CM

## 2020-08-05 DIAGNOSIS — N8110 Cystocele, unspecified: Secondary | ICD-10-CM | POA: Diagnosis not present

## 2020-08-05 MED ORDER — FLUCONAZOLE 150 MG PO TABS
150.0000 mg | ORAL_TABLET | Freq: Once | ORAL | 0 refills | Status: AC
Start: 2020-08-05 — End: 2020-08-05

## 2020-08-05 MED ORDER — SULFAMETHOXAZOLE-TRIMETHOPRIM 800-160 MG PO TABS: 1.0000 | ORAL_TABLET | Freq: Two times a day (BID) | ORAL | 0 refills | Status: AC

## 2020-08-05 NOTE — Addendum Note (Signed)
Addended by: Joseph Pierini D on: 08/05/2020 04:14 PM   Modules accepted: Orders

## 2020-08-05 NOTE — Addendum Note (Signed)
Addended by: Joseph Pierini D on: 08/05/2020 04:16 PM   Modules accepted: Orders

## 2020-08-05 NOTE — Progress Notes (Signed)
Lisa Vega [birth year] [id number]  SUBJECTIVE:  86 y.o. [id number] female presents for reinsertion of her pessary.  At her pessary maintenance exam last week she decided that she wanted to see how taking a break from pessary use would go.  She had difficulty with the bladder prolapse and having trouble with emptying her bladder, having to manually reduce the prolapse to get herself to be able to void.  She also had developed some burning with urination.  Current Outpatient Medications  Medication Sig Dispense Refill  . acetaminophen (TYLENOL) 325 MG tablet Take 650 mg by mouth every 6 (six) hours as needed for mild pain.     Marland Kitchen albuterol (PROAIR HFA) 108 (90 Base) MCG/ACT inhaler USE 1-2 PUFFS EVERY 4-6 HOURS AS NEEDED 8.5 g 5  . ALPRAZolam (XANAX) 0.25 MG tablet Take 1 tablet (0.25 mg total) by mouth daily as needed. for anxiety 20 tablet 1  . aspirin EC 81 MG tablet Take 81 mg by mouth daily.    . baclofen (LIORESAL) 10 MG tablet TAKE 1/2 TABLET BY MOUTH TWICE A DAY 90 tablet 0  . blood glucose meter kit and supplies KIT Dispense based on patient and insurance preference. Use up to four times daily as directed. (FOR E11.9). 1 each 0  . budesonide-formoterol (SYMBICORT) 160-4.5 MCG/ACT inhaler USE 2 INHALATIONS ORALLY   TWICE DAILY 30.6 g 2  . clidinium-chlordiazePOXIDE (LIBRAX) 5-2.5 MG capsule SMARTSIG:1 Capsule(s) By Mouth Every 12 Hours PRN    . doxycycline (VIBRA-TABS) 100 MG tablet Take 1 tablet (100 mg total) by mouth 2 (two) times daily. 20 tablet 0  . famotidine (PEPCID) 40 MG tablet Take 1 tablet (40 mg total) by mouth daily. 90 tablet 3  . folic acid (FOLVITE) 1 MG tablet Take 1 mg by mouth daily.    Marland Kitchen gabapentin (NEURONTIN) 100 MG capsule Take 2 in the morning, take 3 at bedtime 450 capsule 3  . glucose blood (ONETOUCH ULTRA) test strip Use to check blood sugars twice a day 100 each 5  . ibuprofen (ADVIL,MOTRIN) 200 MG tablet Take 200 mg by mouth every 6 (six) hours as needed for  headache or mild pain.    Marland Kitchen linaclotide (LINZESS) 72 MCG capsule Take 72 mcg by mouth daily as needed (constipation).     . meclizine (ANTIVERT) 25 MG tablet Take 0.5-1 tablets (12.5-25 mg total) by mouth 3 (three) times daily as needed for dizziness. 30 tablet 5  . methotrexate 50 MG/2ML injection Inject 17.5 mg into the skin every Sunday. Patient is not taking it Sunday, October 13- patient reported  1  . mupirocin ointment (BACTROBAN) 2 % Place 1 application into the nose 2 (two) times daily. 22 g 1  . NONFORMULARY OR COMPOUNDED ITEM Estradiol .02% 1 ML Prefilled Applicator Sig: apply vaginally twice a week #90 Day Supply with 4 refills 1 each 0  . omeprazole (PRILOSEC) 40 MG capsule Take 40 mg by mouth daily.    [name] DELICA LANCETS 51Z MISC USE TO CHECK SUGARS UP TO 4 TIMES A DAY  0  . predniSONE (DELTASONE) 10 MG tablet Take 1 tablet (10 mg total) by mouth 2 (two) times daily with a meal. 20 tablet 0  . predniSONE (DELTASONE) 20 MG tablet Take 1 tablet (20 mg total) by mouth daily with breakfast. 7 tablet 1  . sertraline (ZOLOFT) 100 MG tablet TAKE 1 TABLET BY MOUTH EVERY DAY 90 tablet 1  . SYNTHROID 88 MCG tablet TAKE 1 TABLET  DAILY 90 tablet 1  . Tiotropium Bromide Monohydrate (SPIRIVA RESPIMAT) 2.5 MCG/ACT AERS Inhale 2 puffs into the lungs daily. 12 g 1  . traMADol (ULTRAM) 50 MG tablet Take 50 mg by mouth 3 (three) times daily as needed for moderate pain.   0  . triamcinolone ointment (KENALOG) 0.1 % Apply 1 application topically 2 (two) times daily as needed. 30 g 3   No current facility-administered medications for this visit.   Allergies: Codeine, Demerol, and Meperidine hcl  No LMP recorded. Patient is postmenopausal.  Past medical history,surgical history, problem list, medications, allergies, family history and social history were all reviewed and documented as reviewed in the EPIC chart.  ROS:  Feeling well. No dyspnea or chest pain on exertion.  No abdominal pain,  change in bowel habits, black or bloody stools.  No urinary tract symptoms. GYN ROS: no abnormal bleeding, pelvic pain or discharge   OBJECTIVE:  BP 118/76  The patient appears well, alert, oriented x 3, in no distress. PELVIC EXAM: VULVA: normal appearing vulva with no masses, tenderness or lesions, VAGINA:normal appearing vagina with normal color and discharge, no lesions,atrophic changes noted.  Diffuse prolapse with moderate or greater cystocele and cervical prolapse.No erosions. Bimanual without mass or tenderness appreciated. The original ring pessary with support is reinserted without difficulty. Chaperone: [name] present during the examination  ASSESSMENT:  86 y.o. [id number] with pelvic relaxation/prolapse here for reinsertion of pessary.  PLAN:  Pessary is reinserted.  Will check UA given dysuria symptoms.  She does have an appointment in about 2 weeks to potentially try a new pessary since she feels the prolapse has advanced a bit and had started to slip around her current pessary.  She will leave the current pessary in until that time.   Joseph Pierini MD 08/05/20

## 2020-08-06 ENCOUNTER — Ambulatory Visit (INDEPENDENT_AMBULATORY_CARE_PROVIDER_SITE_OTHER): Payer: Medicare Other | Admitting: Neurology

## 2020-08-06 ENCOUNTER — Encounter: Payer: Self-pay | Admitting: Neurology

## 2020-08-06 VITALS — BP 127/75 | HR 68 | Ht 64.0 in | Wt 140.0 lb

## 2020-08-06 DIAGNOSIS — R251 Tremor, unspecified: Secondary | ICD-10-CM

## 2020-08-06 DIAGNOSIS — G4489 Other headache syndrome: Secondary | ICD-10-CM

## 2020-08-06 MED ORDER — PREGABALIN 25 MG PO CAPS
25.0000 mg | ORAL_CAPSULE | Freq: Two times a day (BID) | ORAL | 0 refills | Status: DC
Start: 2020-08-06 — End: 2020-09-13

## 2020-08-06 NOTE — Progress Notes (Signed)
PATIENT: Lisa Vega DOB: 1932-10-13  REASON FOR VISIT: follow up HISTORY FROM: patient  HISTORY OF PRESENT ILLNESS: Today 08/06/20  Lisa Vega 86 year old female with history of cerebrovascular disease, dizziness, headache, and memory disturbance. She is on gabapentin. Vertigo is well controlled after completing physical therapy. For headache, baclofen was added, was not helpful.  Has a jaw tremor, intermittently.  Continues to report headache, described as burning sensation to the top of her head, sensation can move around, makes her anxious.  It may happen once a day, less than 15 minutes, is not painful. It more scares her.  Is not clear gabapentin is helpful, has not been able to tolerate higher doses due to nausea.  No recent falls.  Lives with her daughter, she drives a car.  She takes Xanax at night if needed.  Presents today for evaluation unaccompanied.  Taking gabapentin 200/300 mg daily. She is quite active and well-appearing.   HISTORY 12/07/2019 SS: Lisa Vega is an 86 year old female with history of cerebrovascular disease.  She has history of dizziness, headache, and memory disturbance.  She remains on gabapentin 200 mg 3 times a day.  She completed physical therapy for positional vertigo that was beneficial.  She continues to complain of problems with headache.  She describes a burning sensation to the top of her head, sides of her head, or back of the head.  She reports a sensation may be sharp, is brief and subsides, like a twinge.  She also complains of some neck stiffness.  She has not had any falls. She continues to have numbness to the left side of her neck and jaw from prior surgery.  She does have significant rheumatoid arthritis, that has caused deformity to her hands.  She recently had a cortisone injection to her left knee.  She lives with her daughter, she drives a car.  She thinks gabapentin has been helpful for her headache and jaw tremor.  She does report  drowsiness after taking her midday dose.  She continues to report difficulty with short-term memory.  She presents today for evaluation unaccompanied.   REVIEW OF SYSTEMS: Out of a complete 14 system review of symptoms, the patient complains only of the following symptoms, and all other reviewed systems are negative.  Headache  ALLERGIES: Allergies  Allergen Reactions  . Codeine     REACTION: hallucinations  . Demerol   . Meperidine Hcl     REACTION: severe GI upset    HOME MEDICATIONS: Outpatient Medications Prior to Visit  Medication Sig Dispense Refill  . acetaminophen (TYLENOL) 325 MG tablet Take 650 mg by mouth every 6 (six) hours as needed for mild pain.     Marland Kitchen albuterol (PROAIR HFA) 108 (90 Base) MCG/ACT inhaler USE 1-2 PUFFS EVERY 4-6 HOURS AS NEEDED 8.5 g 5  . ALPRAZolam (XANAX) 0.25 MG tablet Take 1 tablet (0.25 mg total) by mouth daily as needed. for anxiety 20 tablet 1  . aspirin EC 81 MG tablet Take 81 mg by mouth daily.    . baclofen (LIORESAL) 10 MG tablet TAKE 1/2 TABLET BY MOUTH TWICE A DAY 90 tablet 0  . blood glucose meter kit and supplies KIT Dispense based on patient and insurance preference. Use up to four times daily as directed. (FOR E11.9). 1 each 0  . budesonide-formoterol (SYMBICORT) 160-4.5 MCG/ACT inhaler USE 2 INHALATIONS ORALLY   TWICE DAILY 30.6 g 2  . clidinium-chlordiazePOXIDE (LIBRAX) 5-2.5 MG capsule SMARTSIG:1 Capsule(s) By Mouth Every 12  Hours PRN    . doxycycline (VIBRA-TABS) 100 MG tablet Take 1 tablet (100 mg total) by mouth 2 (two) times daily. 20 tablet 0  . famotidine (PEPCID) 40 MG tablet Take 1 tablet (40 mg total) by mouth daily. 90 tablet 3  . folic acid (FOLVITE) 1 MG tablet Take 1 mg by mouth daily.    Marland Kitchen glucose blood (ONETOUCH ULTRA) test strip Use to check blood sugars twice a day 100 each 5  . ibuprofen (ADVIL,MOTRIN) 200 MG tablet Take 200 mg by mouth every 6 (six) hours as needed for headache or mild pain.    Marland Kitchen linaclotide  (LINZESS) 72 MCG capsule Take 72 mcg by mouth daily as needed (constipation).     . meclizine (ANTIVERT) 25 MG tablet Take 0.5-1 tablets (12.5-25 mg total) by mouth 3 (three) times daily as needed for dizziness. 30 tablet 5  . methotrexate 50 MG/2ML injection Inject 17.5 mg into the skin every Sunday. Patient is not taking it Sunday, October 13- patient reported  1  . mupirocin ointment (BACTROBAN) 2 % Place 1 application into the nose 2 (two) times daily. 22 g 1  . NONFORMULARY OR COMPOUNDED ITEM Estradiol .02% 1 ML Prefilled Applicator Sig: apply vaginally twice a week #90 Day Supply with 4 refills 1 each 0  . omeprazole (PRILOSEC) 40 MG capsule Take 40 mg by mouth daily.    [name] DELICA LANCETS 72Z MISC USE TO CHECK SUGARS UP TO 4 TIMES A DAY  0  . predniSONE (DELTASONE) 10 MG tablet Take 1 tablet (10 mg total) by mouth 2 (two) times daily with a meal. 20 tablet 0  . predniSONE (DELTASONE) 20 MG tablet Take 1 tablet (20 mg total) by mouth daily with breakfast. 7 tablet 1  . sertraline (ZOLOFT) 100 MG tablet TAKE 1 TABLET BY MOUTH EVERY DAY 90 tablet 1  . sulfamethoxazole-trimethoprim (BACTRIM DS) 800-160 MG tablet Take 1 tablet by mouth 2 (two) times daily for 3 days. 6 tablet 0  . SYNTHROID 88 MCG tablet TAKE 1 TABLET DAILY 90 tablet 1  . Tiotropium Bromide Monohydrate (SPIRIVA RESPIMAT) 2.5 MCG/ACT AERS Inhale 2 puffs into the lungs daily. 12 g 1  . traMADol (ULTRAM) 50 MG tablet Take 50 mg by mouth 3 (three) times daily as needed for moderate pain.   0  . triamcinolone ointment (KENALOG) 0.1 % Apply 1 application topically 2 (two) times daily as needed. 30 g 3  . gabapentin (NEURONTIN) 100 MG capsule Take 2 in the morning, take 3 at bedtime 450 capsule 3   No facility-administered medications prior to visit.    PAST MEDICAL HISTORY: Past Medical History:  Diagnosis Date  . Anxiety   . Arthritis    RA  . BPV (benign positional vertigo) 03/10/2018  . Cancer ([place])    lung ca  .  Carotid stenosis, left 09/19/2018  . COPD (chronic obstructive pulmonary disease) ([place])   . Depression   . Dyspnea    with exertion  . GERD (gastroesophageal reflux disease)   . Headache syndrome 03/09/2019  . Hypercholesteremia   . Hypothyroidism   . Memory difficulties 10/28/2015  . Pneumonia     x3  last time 2017  . Pre-diabetes   . Rheumatoid arthritis(714.0)   . Thyroid disease   . Tremor 10/28/2015   Jaw tremor  . Uterine prolapse   . Vitamin D deficiency     PAST SURGICAL HISTORY: Past Surgical History:  Procedure Laterality Date  .  ANGIOPLASTY Left 10/10/2018   Procedure: ANGIOPLASTY using 1cm x 14cm Xenosure Patch;  Surgeon: Marty Heck, MD;  Location: [facility];  Service: Vascular;  Laterality: Left;  . APPENDECTOMY    . COLONOSCOPY W/ POLYPECTOMY    . ENDARTERECTOMY Left 10/10/2018   Procedure: ENDARTERECTOMY CAROTID;  Surgeon: Marty Heck, MD;  Location: [facility];  Service: Vascular;  Laterality: Left;  . EYE SURGERY Bilateral    cataract  . FOOT SURGERY Bilateral    arthritis  . Hands  Bilateral    Arthritis  . HEMORRHOID SURGERY    . right lobectomy  10/2004  . TONSILLECTOMY AND ADENOIDECTOMY      FAMILY HISTORY: Family History  Problem Relation Age of Onset  . Emphysema Mother   . Asthma Mother   . Lung cancer Father   . Pancreatic cancer Father   . Bone cancer Father   . Heart disease Brother        x1  . Heart disease Sister        x2  . Stroke Maternal Grandfather     SOCIAL HISTORY: Social History   Socioeconomic History  . Marital status: Widowed    Spouse name: Not on file  . Number of children: 3  . Years of education: LPN  . Highest education level: Not on file  Occupational History  . Occupation: retired    Fish farm manager: RETIRED    Comment: LPN at [employer]  Tobacco Use  . Smoking status: Former Smoker    Packs/day: 1.00    Years: 20.00    Pack years: 20.00    Types: Cigarettes    Quit date:  11/10/2004    Years since quitting: 15.7  . Smokeless tobacco: Never Used  Vaping Use  . Vaping Use: Never used  Substance and Sexual Activity  . Alcohol use: Yes    Alcohol/week: 1.0 standard drink    Types: 1 Glasses of wine per week    Comment: socially  . Drug use: No  . Sexual activity: Not Currently    Comment: 1st intercourse- 19, partners- 5,   Other Topics Concern  . Not on file  Social History Narrative   Patient drinks about 3 cups of coffee daily.   Patient is right handed.    Social Determinants of Health   Financial Resource Strain:   . Difficulty of Paying Living Expenses:   Food Insecurity:   . Worried About Charity fundraiser in the Last Year:   . Arboriculturist in the Last Year:   Transportation Needs:   . Film/video editor (Medical):   Marland Kitchen Lack of Transportation (Non-Medical):   Physical Activity:   . Days of Exercise per Week:   . Minutes of Exercise per Session:   Stress:   . Feeling of Stress :   Social Connections:   . Frequency of Communication with Friends and Family:   . Frequency of Social Gatherings with Friends and Family:   . Attends Religious Services:   . Active Member of Clubs or Organizations:   . Attends Archivist Meetings:   Marland Kitchen Marital Status:   Intimate Partner Violence:   . Fear of Current or Ex-Partner:   . Emotionally Abused:   Marland Kitchen Physically Abused:   . Sexually Abused:     PHYSICAL EXAM  Vitals:   08/06/20 1240  BP: 127/75  Pulse: 68  Weight: 140 lb (63.5 kg)  Height: '5\' 4"'  (1.626 m)  Body mass index is 24.03 kg/m.  Generalized: Well developed, in no acute distress   Neurological examination  Mentation: Alert oriented to time, place, history taking. Follows all commands speech and language fluent Cranial nerve II-XII: Pupils were equal round reactive to light. Extraocular movements were full, visual field were full on confrontational test. Facial sensation and strength were normal. Head turning and  shoulder shrug were normal and symmetric. Mild jaw tremor noted. Motor: The motor testing reveals 5 over 5 strength of all 4 extremities. Good symmetric motor tone is noted throughout.  Sensory: Sensory testing is intact to soft touch on all 4 extremities. No evidence of extinction is noted.  Coordination: Cerebellar testing reveals good finger-nose-finger and heel-to-shin bilaterally.  Deformities to fingers from RA. Gait and station: Gait is normal.  Reflexes: Deep tendon reflexes are symmetric but depressed throughout  DIAGNOSTIC DATA (LABS, IMAGING, TESTING) - I reviewed patient records, labs, notes, testing and imaging myself where available.  Lab Results  Component Value Date   WBC 5.3 08/01/2019   HGB 12.4 08/01/2019   HCT 37.6 08/01/2019   MCV 102.3 (H) 08/01/2019   PLT 254.0 08/01/2019      Component Value Date/Time   NA 137 02/02/2020 1144   NA 139 08/10/2011 1457   K 4.2 02/02/2020 1144   K 4.4 08/10/2011 1457   CL 101 02/02/2020 1144   CL 98 08/10/2011 1457   CO2 31 02/02/2020 1144   CO2 29 08/10/2011 1457   GLUCOSE 92 02/02/2020 1144   GLUCOSE 98 08/10/2011 1457   BUN 10 02/02/2020 1144   BUN 15 08/10/2011 1457   CREATININE 0.97 02/02/2020 1144   CREATININE 0.9 08/10/2011 1457   CALCIUM 9.4 02/02/2020 1144   CALCIUM 8.9 08/10/2011 1457   PROT 7.4 02/02/2020 1144   PROT 6.7 04/21/2018 0821   PROT 6.8 08/10/2011 1457   ALBUMIN 4.0 02/02/2020 1144   ALBUMIN 4.0 04/21/2018 0821   AST 20 02/02/2020 1144   AST 26 08/10/2011 1457   ALT 11 02/02/2020 1144   ALT 21 08/10/2011 1457   ALKPHOS 86 02/02/2020 1144   ALKPHOS 67 08/10/2011 1457   BILITOT 0.6 02/02/2020 1144   BILITOT 0.5 04/21/2018 0821   BILITOT 0.70 08/10/2011 1457   GFRNONAA >60 10/11/2018 0415   GFRAA >60 10/11/2018 0415   Lab Results  Component Value Date   CHOL 221 (H) 08/01/2019   HDL 71.30 08/01/2019   LDLCALC 126 (H) 08/01/2019   TRIG 117.0 08/01/2019   CHOLHDL 3 08/01/2019   Lab  Results  Component Value Date   HGBA1C 5.9 02/02/2020   Lab Results  Component Value Date   VITAMINB12 354 10/28/2015   Lab Results  Component Value Date   TSH 1.02 02/02/2020    ASSESSMENT AND PLAN 86 y.o. year old female  has a past medical history of Anxiety, Arthritis, BPV (benign positional vertigo) (03/10/2018), Cancer ([place]), Carotid stenosis, left (09/19/2018), COPD (chronic obstructive pulmonary disease) ([place]), Depression, Dyspnea, GERD (gastroesophageal reflux disease), Headache syndrome (03/09/2019), Hypercholesteremia, Hypothyroidism, Memory difficulties (10/28/2015), Pneumonia, Pre-diabetes, Rheumatoid arthritis(714.0), Thyroid disease, Tremor (10/28/2015), Uterine prolapse, and Vitamin D deficiency. here with:  1.  Cerebrovascular disease, status post left carotid endarterectomy 2.  Positional vertigo 3.  Reports of mild memory disturbance 4.  Headache 5. Jaw tremor  She continues to report headache, described as burning sensation to various regions of her head.  Unclear gabapentin is especially helpful.  We will switch to Lyrica, starting out 25 mg twice  daily, will have to watch to see if jaw tremor intensifies.  I have indicated she may require dose adjustment, we will start slow.  She has previously tried Cymbalta without benefit.  She will stop the gabapentin, switch to Lyrica.  She will follow-up in 6 months or sooner if needed.  I spent 30 minutes of face-to-face and non-face-to-face time with patient.  This included previsit chart review, lab review, study review, order entry, electronic health record documentation, patient education.    Butler Denmark, AGNP-C, DNP 08/06/2020, 1:13 PM [facility] [address] [place], [place] [address] [phone]

## 2020-08-06 NOTE — Patient Instructions (Signed)
Stop gabapentin  Start Lyrica 25 mg twice daily  Call for dose adjustment!  See you back in 6 months

## 2020-08-07 ENCOUNTER — Other Ambulatory Visit: Payer: Self-pay

## 2020-08-07 LAB — URINALYSIS, COMPLETE W/RFL CULTURE
Bilirubin Urine: NEGATIVE
Glucose, UA: NEGATIVE
Hyaline Cast: NONE SEEN /LPF
Ketones, ur: NEGATIVE
Nitrites, Initial: NEGATIVE
Protein, ur: NEGATIVE
Specific Gravity, Urine: 1.004 (ref 1.001–1.03)
pH: 5.5 (ref 5.0–8.0)

## 2020-08-07 LAB — URINE CULTURE
MICRO NUMBER:: 10802734
SPECIMEN QUALITY:: ADEQUATE

## 2020-08-07 LAB — CULTURE INDICATED

## 2020-08-07 MED ORDER — NITROFURANTOIN MONOHYD MACRO 100 MG PO CAPS
100.0000 mg | ORAL_CAPSULE | Freq: Two times a day (BID) | ORAL | 0 refills | Status: DC
Start: 2020-08-07 — End: 2020-10-08

## 2020-08-07 NOTE — Progress Notes (Signed)
I have read the note, and I agree with the clinical assessment and plan.  Barby Colvard K Stephen Baruch   

## 2020-08-08 ENCOUNTER — Telehealth: Payer: Self-pay | Admitting: Neurology

## 2020-08-08 NOTE — Telephone Encounter (Signed)
This is FYI, pt has called to inform Lisa Vega that re: the new medication she was a little nauseated last night but she feels ok today and will try this new medication for about a week and will call back at that time.

## 2020-08-08 NOTE — Telephone Encounter (Signed)
Noted. (pregabaline 25mg  po bid).

## 2020-08-19 ENCOUNTER — Ambulatory Visit: Payer: Medicare Other | Admitting: Obstetrics and Gynecology

## 2020-08-26 ENCOUNTER — Ambulatory Visit: Payer: Medicare Other | Admitting: Obstetrics and Gynecology

## 2020-09-10 ENCOUNTER — Telehealth: Payer: Self-pay | Admitting: Pulmonary Disease

## 2020-09-10 ENCOUNTER — Other Ambulatory Visit: Payer: Self-pay

## 2020-09-10 DIAGNOSIS — R030 Elevated blood-pressure reading, without diagnosis of hypertension: Secondary | ICD-10-CM

## 2020-09-10 NOTE — Telephone Encounter (Signed)
lmtcb for pt.  

## 2020-09-11 ENCOUNTER — Other Ambulatory Visit: Payer: Self-pay | Admitting: Internal Medicine

## 2020-09-11 ENCOUNTER — Other Ambulatory Visit: Payer: Self-pay

## 2020-09-11 ENCOUNTER — Ambulatory Visit: Payer: Medicare Other | Admitting: Pharmacist

## 2020-09-11 DIAGNOSIS — E119 Type 2 diabetes mellitus without complications: Secondary | ICD-10-CM

## 2020-09-11 DIAGNOSIS — F418 Other specified anxiety disorders: Secondary | ICD-10-CM

## 2020-09-11 DIAGNOSIS — J449 Chronic obstructive pulmonary disease, unspecified: Secondary | ICD-10-CM

## 2020-09-11 NOTE — Chronic Care Management (AMB) (Signed)
Chronic Care Management Pharmacy  Name: Lisa Vega  MRN: [id number] DOB: 1931/12/29   Chief Complaint/ HPI  Lisa Vega,  86 y.o. , female presents for their Initial CCM visit with the clinical pharmacist via telephone due to COVID-19 Pandemic.  PCP : Binnie Rail, MD Patient Care Team: Binnie Rail, MD as PCP - General (Internal Medicine) Wellington Hampshire, MD as PCP - Cardiology (Cardiology) Clarene Essex, MD (Gastroenterology) Izora Gala, MD as Consulting Physician (Otolaryngology) Lenon Oms, MD as Referring Physician (Obstetrics and Gynecology) Juanito Doom, MD as Consulting Physician (Pulmonary Disease) Curt Bears, MD as Consulting Physician (Oncology) Gavin Pound, MD as Consulting Physician (Rheumatology) Marilynne Halsted, MD as Referring Physician (Ophthalmology) Marty Heck, MD as Consulting Physician (Vascular Surgery) Kathrynn Ducking, MD as Consulting Physician (Neurology) Charlton Haws, [facility] as Pharmacist (Pharmacist)  Their chronic conditions include: Diabetes, GERD, COPD, Hypothyroidism, Depression, Anxiety, Osteoporosis and Rheumatoid arthritis, Hyperparathyroidism, Vitamin D deficiency, Vertigo  Pt lived in [place] for 73 years, has lived in [place] for 24 years now. Lives with her daughter. Her Husband died 93 years ago. Pt reports she recently lost her son which has been very hard on her.  Office Visits: 06/25/20 Dr Quay Burow OV: acute visit for eye irritation. Tx for conjunctivitis - doxycycline 100 mg BID, polytrim eye drops.  Consult Visit: 08/06/20 NP Butler Denmark (neurology - Dr Jannifer Franklin): hx CVA, dizziness, headache, memory issues. Vertigo controlled with PT. Baclofen was not helpful for headaches. Switched gabapentin to Lyrica for headaches.   08/05/20 Dr Delilah Shan (OB/GYN): reinsertion of pessary for prolapse. Tx UTI/yeast infection with Fluconazole, Bactrim.  07/10/20 Dr Susa Simmonds [facility]): f/u  exam.  05/18/20 Dr Nelva Bush (ortho):  05/02/20 Dr Ander Slade (pulmonary): f/u for COPD. Recent exacerbation treated with prednisone. Exacerbation this visit tx'd with doxycycline and prednisone x 7 days.  02/28/20 Dr Annamaria Boots (rheumatology): f/u for RA.  02/23/20 Dr Watt Climes (GI): f/u for constipation, GERD.  Allergies  Allergen Reactions  . Codeine     REACTION: hallucinations  . Demerol   . Meperidine Hcl     REACTION: severe GI upset    Medications: Outpatient Encounter Medications as of 09/11/2020  Medication Sig Note  . acetaminophen (TYLENOL) 325 MG tablet Take 650 mg by mouth every 6 (six) hours as needed for mild pain.    Marland Kitchen albuterol (PROAIR HFA) 108 (90 Base) MCG/ACT inhaler USE 1-2 PUFFS EVERY 4-6 HOURS AS NEEDED   . ALPRAZolam (XANAX) 0.25 MG tablet Take 1 tablet (0.25 mg total) by mouth daily as needed. for anxiety   . aspirin EC 81 MG tablet Take 81 mg by mouth daily.   . blood glucose meter kit and supplies KIT Dispense based on patient and insurance preference. Use up to four times daily as directed. (FOR E11.9).   . budesonide-formoterol (SYMBICORT) 160-4.5 MCG/ACT inhaler USE 2 INHALATIONS ORALLY   TWICE DAILY   . clidinium-chlordiazePOXIDE (LIBRAX) 5-2.5 MG capsule SMARTSIG:1 Capsule(s) By Mouth Every 12 Hours PRN   . famotidine (PEPCID) 40 MG tablet Take 1 tablet (40 mg total) by mouth daily.   . folic acid (FOLVITE) 1 MG tablet Take 1 mg by mouth daily.   Marland Kitchen gabapentin (NEURONTIN) 100 MG capsule Take 100 mg by mouth 3 (three) times daily.   Marland Kitchen ibuprofen (ADVIL,MOTRIN) 200 MG tablet Take 200 mg by mouth every 6 (six) hours as needed for headache or mild pain.   Marland Kitchen linaclotide (LINZESS) 72 MCG capsule Take 72 mcg  by mouth daily as needed (constipation).    . meclizine (ANTIVERT) 25 MG tablet Take 0.5-1 tablets (12.5-25 mg total) by mouth 3 (three) times daily as needed for dizziness.   . methotrexate 50 MG/2ML injection Inject 17.5 mg into the skin every Sunday. Patient is not taking  it Sunday, October 13- patient reported 10/06/2018: .59m  every sunday  . mupirocin ointment (BACTROBAN) 2 % Place 1 application into the nose 2 (two) times daily.   . NONFORMULARY OR COMPOUNDED ITEM Estradiol .02% 1 ML Prefilled Applicator Sig: apply vaginally twice a week #90 Day Supply with 4 refills   . omeprazole (PRILOSEC) 40 MG capsule Take 40 mg by mouth daily.   . sertraline (ZOLOFT) 100 MG tablet Take 1 tablet (100 mg total) by mouth daily. Overdue for follow-up appt must see provider for future refills   . SYNTHROID 88 MCG tablet TAKE 1 TABLET DAILY   . Tiotropium Bromide Monohydrate (SPIRIVA RESPIMAT) 2.5 MCG/ACT AERS Inhale 2 puffs into the lungs daily.   . traMADol (ULTRAM) 50 MG tablet Take 50 mg by mouth 3 (three) times daily as needed for moderate pain.    .Marland Kitchentriamcinolone ointment (KENALOG) 0.1 % Apply 1 application topically 2 (two) times daily as needed.   . baclofen (LIORESAL) 10 MG tablet TAKE 1/2 TABLET BY MOUTH TWICE A DAY (Patient not taking: Reported on 09/11/2020)   . doxycycline (VIBRA-TABS) 100 MG tablet Take 1 tablet (100 mg total) by mouth 2 (two) times daily. (Patient not taking: Reported on 09/11/2020)   . glucose blood (ONETOUCH ULTRA) test strip Use to check blood sugars twice a day (Patient not taking: Reported on 09/11/2020)   . nitrofurantoin, macrocrystal-monohydrate, (MACROBID) 100 MG capsule Take 1 capsule (100 mg total) by mouth 2 (two) times daily.   .Glory RosebushDELICA LANCETS 366MMISC USE TO CHECK SUGARS UP TO 4 TIMES A DAY   . predniSONE (DELTASONE) 10 MG tablet Take 1 tablet (10 mg total) by mouth 2 (two) times daily with a meal. (Patient not taking: Reported on 09/11/2020)   . predniSONE (DELTASONE) 20 MG tablet Take 1 tablet (20 mg total) by mouth daily with breakfast. (Patient not taking: Reported on 09/11/2020)   . pregabalin (LYRICA) 25 MG capsule Take 1 capsule (25 mg total) by mouth 2 (two) times daily. (Patient not taking: Reported on 09/11/2020)   .  [DISCONTINUED] sertraline (ZOLOFT) 100 MG tablet TAKE 1 TABLET BY MOUTH EVERY DAY    No facility-administered encounter medications on file as of 09/11/2020.    Wt Readings from Last 3 Encounters:  08/06/20 140 lb (63.5 kg)  06/25/20 138 lb (62.6 kg)  05/02/20 138 lb 3.2 oz (62.7 kg)    Current Diagnosis/Assessment:  SDOH Interventions     Most Recent Value  SDOH Interventions  Financial Strain Interventions Intervention Not Indicated      Goals Addressed            This Visit's Progress   . Pharmacy Care Plan       CARE PLAN ENTRY (see longitudinal plan of care for additional care plan information)  Current Barriers:  . Chronic Disease Management support, education, and care coordination needs related to Diabetes, COPD, Depression, and Anxiety  Diabetes Lab Results  Component Value Date/Time   HGBA1C 5.9 02/02/2020 11:44 AM   HGBA1C 5.6 08/01/2019 12:15 PM .  Pharmacist Clinical Goal(s): o Over the next 180 days, patient will work with PharmD and providers to maintain A1c goal <7% .  Current regimen:  o No medications o Testing supplies . Interventions: o Discussed blood sugar goals and importance of healthy diet for prevention of diabetic complications . Patient self care activities - Over the next 180 days, patient will: o Check blood sugar as needed, document, and provide at future appointments  COPD . Pharmacist Clinical Goal(s) o Over the next 180 days, patient will work with PharmD and providers to optimize therapy . Current regimen:  o Spiriva Respimat 2 puffs daily o Symbicort 160-4.5 mcg/act 2 puffs twice a day o Albuterol HFA prn . Interventions: o Discussed importance of maintenance inhalers to prevent exacerbations . Patient self care activities - Over the next 180 days, patient will: o Continue medications as prescribed o Follow up with pulmonary as scheduled  Depression / Anxiety . Pharmacist Clinical Goal(s) o Over the next 180 days,  patient will work with PharmD and providers to optimize therapy . Current regimen:  o Sertraline 100 mg daily o Alprazolam 0.25 mg as needed . Interventions: o Discussed benefits of medications and importance of using alprazolam sparingly o Coordinate with PCP for alprazolam refill . Patient self care activities - Over the next 180 days, patient will: o Continue medications as prescribed  Medication management . Pharmacist Clinical Goal(s): o Over the next 180 days, patient will work with PharmD and providers to maintain optimal medication adherence . Current pharmacy: [facility] . Interventions o Comprehensive medication review performed. o Continue current medication management strategy . Patient self care activities - Over the next 180 days, patient will: o Focus on medication adherence by fill date o Take medications as prescribed o Report any questions or concerns to PharmD and/or provider(s)  Initial goal documentation       Diabetes   A1c goal <7%  Recent Relevant Labs: Lab Results  Component Value Date/Time   HGBA1C 5.9 02/02/2020 11:44 AM   HGBA1C 5.6 08/01/2019 12:15 PM   GFR 54.53 (L) 02/02/2020 11:44 AM   GFR 65.36 08/01/2019 12:15 PM   MICROALBUR <0.7 02/02/2020 11:44 AM   MICROALBUR 5.1 (H) 10/03/2018 12:01 PM    Last diabetic Eye exam: No results found for: HMDIABEYEEXA  Last diabetic Foot exam: No results found for: HMDIABFOOTEX   Checking BG: Weekly  Patient has failed these meds in past: n/a Patient is currently controlled on the following medications: . No medications . Testing supplies  We discussed: diet and exercise extensively  Plan  Continue current medications   COPD   Last spirometry score 08/19/2018: -FEV1 75% predicted -FEV1/FVC 0.62  2 exacerbations this year?  Gold Grade: Gold 2 (FEV1 50-79%) Current COPD Classification:  C (low sx, >/=2 exacerbations/yr)  Lab Results  Component Value Date/Time   EOSPCT 2.8  08/01/2019 12:15 PM   EOSPCT 1.4 08/10/2011 02:57 PM   EOSABS 0.1 08/01/2019 12:15 PM   EOSABS 0.1 08/10/2011 02:57 PM   Patient has failed these meds in past: n/a Patient is currently controlled on the following medications:  . Spiriva Respimat 2 puffs daily . Symbicort 160-4.5 mcg/act 2 puffs BID . Albuterol HFA prn  Using maintenance inhaler regularly? Yes Frequency of rescue inhaler use:  1-2x per week  We discussed:  proper inhaler technique; pt currently feels she is in a flare and has an appt this week with pulmonologist to assess. Typically she takes maintenance inhalers as directed and rarely needs albuterol.   Plan  Continue current medications  Hypothyroidism   Lab Results  Component Value Date/Time  TSH 1.02 02/02/2020 11:44 AM   TSH 0.55 08/01/2019 12:15 PM   Patient has failed these meds in past: n/a Patient is currently controlled on the following medications:  . Synthroid 88 mcg daily  We discussed:  Pt has been controlled on this dose for many years, denies issues currently.  Plan  Continue current medications   GERD   Patient has failed these meds in past: n/a Patient is currently controlled on the following medications:  . Omeprazole 40 mg daily . Famotidine 40 mg daily  We discussed:  Patient is satisfied with current regimen and denies issues  Plan  Continue current medications  Constipation   Patient has failed these meds in past: n/a Patient is currently controlled on the following medications:  . Linzess 72 mcg daily PRN  . Chlordiazepoxide-Clidinium (Librax) 2.5-5 mg q12h PRN  We discussed:  Patient is satisfied with current regimen and denies issues. She usually gets Linzess samples from GI office.  Plan  Continue current medications  Depression / Anxiety   Depression screen [facility] 2/9 05/13/2018 05/12/2017 05/06/2016  Decreased Interest 0 1 0  Down, Depressed, Hopeless 1 1 0  PHQ - 2 Score 1 2 0  Altered sleeping 1 1 -    Tired, decreased energy 1 1 -  Change in appetite 1 1 -  Feeling bad or failure about yourself  1 1 -  Trouble concentrating 0 0 -  Moving slowly or fidgety/restless 0 0 -  Suicidal thoughts 0 0 -  PHQ-9 Score 5 6 -  Difficult doing work/chores Not difficult at all Somewhat difficult -  Some recent data might be hidden   Patient has failed these meds in past: n/a Patient is currently controlled on the following medications:  . Sertraline 100 mg daily . Alprazolam 0.25 mg daily PRN  We discussed:  Pt reports she uses alprazolam infrequently; her rx for #40 pills has lasted > 6 months. She denies issues with sertraline. She has recently lost a son which has been very hard on her and she is taking more alprazolam than usual and requests a refill.  Plan  Continue current medications  Rheuamtoid arthritis   Patient has failed these meds in past: n/a Patient is currently controlled on the following medications:  Marland Kitchen Methotexate 17.5 mg injection weekly . Folic acid 1 mg daily . Ibuprofen 200 mg q6h PRN . Tramadol 50 mg TID prn - knee pain   We discussed:  Patient is satisfied with current OTC regimen and denies issues. Discussed importance of folic acid while taking methotrexate.  Plan  Continue current medications  Headache   Patient has failed these meds in past: duloxetine, Baclofen, pregabalin Patient is currently controlled on the following medications:  . Gabapentin 100 mg TID (tremors, headache, burning)  Tylenol 325 mg q6h prn  We discussed:  Pt reports pregabalin did not work for her so she went back to gabapentin. Currently headaches are controlled.  Plan  Continue current medications  Vaginal prolapse   Patient has failed these meds in past: n/a Patient is currently controlled on the following medications:  . Estradiol compound vaginally twice a week  We discussed:  Patient is satisfied with current regimen and denies issues  Plan  Continue current  medications  Osteoporosis   Last DEXA Scan: 11/17/2016  T-Score femoral neck: -2.4  T-Score total hip: -1.2  T-Score lumbar spine: -1.0  T-Score forearm radius: n/a  10-year probability of major osteoporotic fracture: 34%  10-year probability  of hip fracture: 15%  VITD  Date Value Ref Range Status  05/12/2017 25.86 (L) 30.00 - 100.00 ng/mL Final    Patient is a candidate for pharmacologic treatment due to T-Score -1.0 to -2.5 and 10-year risk of major osteoporotic fracture > 20% and T-Score -1.0 to -2.5 and 10-year risk of hip fracture > 3%  Patient has failed these meds in past: alendronate Patient is currently controlled on the following medications:  Marland Kitchen Vitamin D 1000 IU daily  We discussed:  Recommend [phone] units of vitamin D daily. Recommend 1200 mg of calcium daily from dietary and supplemental sources.  Plan  Continue current medications  Health Maintenance   Patient is currently controlled on the following medications:  Marland Kitchen Mupirocin 2% ointment . Triamcinolone 0.1% ointment . Aspirin 81 mg daily . Meclizine 25 mg 1/2-1 tablet TID prn  We discussed:  Pt has not needed meclizine since PT earlier this year improved her vertigo. She is taking aspirin since carotid stenosis procedure last year. She denies issues with bleeding.  Plan  Continue current medications  Medication Management   [facility] uses [facility] for all medications Uses pill box? No - prefers bottles Pt endorses 100% compliance  We discussed: Pt gets most of her medications through mail order, only short supplies or acute meds are filled through [facility]. Pt is satisfied with pharmacy services.  Plan  Continue current medication management strategy    Follow up: as needed  Charlene Brooke, PharmD, BCACP Clinical Pharmacist [facility] [phone]

## 2020-09-11 NOTE — Patient Instructions (Addendum)
Visit Information  Phone number for Pharmacist: [phone]  Thank you for meeting with me to discuss your medications! I look forward to working with you to achieve your health care goals. Below is a summary of what we talked about during the visit:  Goals Addressed            This Visit's Progress   . Pharmacy Care Plan       CARE PLAN ENTRY (see longitudinal plan of care for additional care plan information)  Current Barriers:  . Chronic Disease Management support, education, and care coordination needs related to Diabetes, COPD, Depression, and Anxiety  Diabetes Lab Results  Component Value Date/Time   HGBA1C 5.9 02/02/2020 11:44 AM   HGBA1C 5.6 08/01/2019 12:15 PM .  Pharmacist Clinical Goal(s): o Over the next 180 days, patient will work with PharmD and providers to maintain A1c goal <7% . Current regimen:  o No medications o Testing supplies . Interventions: o Discussed blood sugar goals and importance of healthy diet for prevention of diabetic complications . Patient self care activities - Over the next 180 days, patient will: o Check blood sugar as needed, document, and provide at future appointments  COPD . Pharmacist Clinical Goal(s) o Over the next 180 days, patient will work with PharmD and providers to optimize therapy . Current regimen:  o Spiriva Respimat 2 puffs daily o Symbicort 160-4.5 mcg/act 2 puffs twice a day o Albuterol HFA prn . Interventions: o Discussed importance of maintenance inhalers to prevent exacerbations . Patient self care activities - Over the next 180 days, patient will: o Continue medications as prescribed o Follow up with pulmonary as scheduled  Depression / Anxiety . Pharmacist Clinical Goal(s) o Over the next 180 days, patient will work with PharmD and providers to optimize therapy . Current regimen:  o Sertraline 100 mg daily o Alprazolam 0.25 mg as needed . Interventions: o Discussed benefits of medications and  importance of using alprazolam sparingly o Coordinate with PCP for alprazolam refill . Patient self care activities - Over the next 180 days, patient will: o Continue medications as prescribed  Medication management . Pharmacist Clinical Goal(s): o Over the next 180 days, patient will work with PharmD and providers to maintain optimal medication adherence . Current pharmacy: [facility], [facility] . Interventions o Comprehensive medication review performed. o Continue current medication management strategy . Patient self care activities - Over the next 180 days, patient will: o Focus on medication adherence by fill date o Take medications as prescribed o Report any questions or concerns to PharmD and/or provider(s)  Initial goal documentation      Lisa Vega was given information about Chronic Care Management services today including:  1. CCM service includes personalized support from designated clinical staff supervised by her physician, including individualized plan of care and coordination with other care providers 2. 24/7 contact phone numbers for assistance for urgent and routine care needs. 3. Standard insurance, coinsurance, copays and deductibles apply for chronic care management only during months in which we provide at least 20 minutes of these services. Most insurances cover these services at 100%, however patients may be responsible for any copay, coinsurance and/or deductible if applicable. This service may help you avoid the need for more expensive face-to-face services. 4. Only one practitioner may furnish and bill the service in a calendar month. 5. The patient may stop CCM services at any time (effective at the end of the month) by phone call to the office staff.  Patient agreed to services and verbal consent obtained.   Patient verbalizes understanding of instructions provided today.  The pharmacy team will reach out to the patient again over the next 180 days.    Charlene Brooke, PharmD, BCACP Clinical Pharmacist [facility] Postmenopausal Women Menopause is a normal process in which your ability to get pregnant comes to an end. This process happens slowly over many months or years, usually between the ages of 12 and 47. Menopause is complete when you have missed your menstrual periods for 12 months. It is important to talk with your health care provider about some of the most common conditions that affect women after menopause (postmenopausal women). These include heart disease, cancer, and bone loss (osteoporosis). Adopting a healthy lifestyle and getting preventive care can help to promote your health and wellness. The actions you take can also lower your chances of developing some of these common conditions. What should I know about menopause? During menopause, you may get a number of symptoms, such as:  Hot flashes. These can be moderate or severe.  Night sweats.  Decrease in sex drive.  Mood swings.  Headaches.  Tiredness.  Irritability.  Memory problems.  Insomnia. Choosing to treat or not to treat these symptoms is a decision that you make with your health care provider. Do I need hormone replacement therapy?  Hormone replacement therapy is effective in treating symptoms that are caused by menopause, such as hot flashes and night sweats.  Hormone replacement carries certain risks, especially as you become older. If you are thinking about using estrogen or estrogen with progestin, discuss the benefits and risks with your health care provider. What is my risk for heart disease and stroke? The risk of heart disease, heart attack, and stroke increases as you age. One of the causes may be a change in the body's hormones during menopause. This can affect how your body uses dietary fats, triglycerides, and cholesterol. Heart attack and stroke are medical emergencies. There  are many things that you can do to help prevent heart disease and stroke. Watch your blood pressure  High blood pressure causes heart disease and increases the risk of stroke. This is more likely to develop in people who have high blood pressure readings, are of African descent, or are overweight.  Have your blood pressure checked: ? Every 3-5 years if you are 19-58 years of age. ? Every year if you are 32 years old or older. Eat a healthy diet   Eat a diet that includes plenty of vegetables, fruits, low-fat dairy products, and lean protein.  Do not eat a lot of foods that are high in solid fats, added sugars, or sodium. Get regular exercise Get regular exercise. This is one of the most important things you can do for your health. Most adults should:  Try to exercise for at least 150 minutes each week. The exercise should increase your heart rate and make you sweat (moderate-intensity exercise).  Try to do strengthening exercises at least twice each week. Do these in addition to the moderate-intensity exercise.  Spend less time sitting. Even light physical activity can be beneficial. Other tips  Work with your health care provider to achieve or maintain a healthy weight.  Do not use any products that contain nicotine or tobacco, such as cigarettes, e-cigarettes, and chewing tobacco. If you need help quitting, ask your health care provider.  Know your numbers. Ask your health care provider  to check your cholesterol and your blood sugar (glucose). Continue to have your blood tested as directed by your health care provider. Do I need screening for cancer? Depending on your health history and family history, you may need to have cancer screening at different stages of your life. This may include screening for:  Breast cancer.  Cervical cancer.  Lung cancer.  Colorectal cancer. What is my risk for osteoporosis? After menopause, you may be at increased risk for osteoporosis.  Osteoporosis is a condition in which bone destruction happens more quickly than new bone creation. To help prevent osteoporosis or the bone fractures that can happen because of osteoporosis, you may take the following actions:  If you are 30-34 years old, get at least 1,000 mg of calcium and at least 600 mg of vitamin D per day.  If you are older than age 25 but younger than age 69, get at least 1,200 mg of calcium and at least 600 mg of vitamin D per day.  If you are older than age 37, get at least 1,200 mg of calcium and at least 800 mg of vitamin D per day. Smoking and drinking excessive alcohol increase the risk of osteoporosis. Eat foods that are rich in calcium and vitamin D, and do weight-bearing exercises several times each week as directed by your health care provider. How does menopause affect my mental health? Depression may occur at any age, but it is more common as you become older. Common symptoms of depression include:  Low or sad mood.  Changes in sleep patterns.  Changes in appetite or eating patterns.  Feeling an overall lack of motivation or enjoyment of activities that you previously enjoyed.  Frequent crying spells. Talk with your health care provider if you think that you are experiencing depression. General instructions See your health care provider for regular wellness exams and vaccines. This may include:  Scheduling regular health, dental, and eye exams.  Getting and maintaining your vaccines. These include: ? Influenza vaccine. Get this vaccine each year before the flu season begins. ? Pneumonia vaccine. ? Shingles vaccine. ? Tetanus, diphtheria, and pertussis (Tdap) booster vaccine. Your health care provider may also recommend other immunizations. Tell your health care provider if you have ever been abused or do not feel safe at home. Summary  Menopause is a normal process in which your ability to get pregnant comes to an end.  This condition causes  hot flashes, night sweats, decreased interest in sex, mood swings, headaches, or lack of sleep.  Treatment for this condition may include hormone replacement therapy.  Take actions to keep yourself healthy, including exercising regularly, eating a healthy diet, watching your weight, and checking your blood pressure and blood sugar levels.  Get screened for cancer and depression. Make sure that you are up to date with all your vaccines. This information is not intended to replace advice given to you by your health care provider. Make sure you discuss any questions you have with your health care provider. Document Revised: 12/07/2018 Document Reviewed: 12/07/2018 Elsevier Patient Education  2020 [employer].

## 2020-09-12 NOTE — Telephone Encounter (Signed)
lmtcb for pt.  

## 2020-09-13 ENCOUNTER — Encounter: Payer: Self-pay | Admitting: Pulmonary Disease

## 2020-09-13 ENCOUNTER — Ambulatory Visit (INDEPENDENT_AMBULATORY_CARE_PROVIDER_SITE_OTHER): Payer: Medicare Other

## 2020-09-13 ENCOUNTER — Ambulatory Visit (INDEPENDENT_AMBULATORY_CARE_PROVIDER_SITE_OTHER): Payer: Medicare Other | Admitting: Pulmonary Disease

## 2020-09-13 ENCOUNTER — Other Ambulatory Visit: Payer: Self-pay

## 2020-09-13 VITALS — BP 110/68 | HR 73 | Temp 95.0°F | Ht 64.0 in | Wt 138.8 lb

## 2020-09-13 DIAGNOSIS — M069 Rheumatoid arthritis, unspecified: Secondary | ICD-10-CM | POA: Diagnosis not present

## 2020-09-13 DIAGNOSIS — R05 Cough: Secondary | ICD-10-CM | POA: Diagnosis not present

## 2020-09-13 DIAGNOSIS — R918 Other nonspecific abnormal finding of lung field: Secondary | ICD-10-CM | POA: Diagnosis not present

## 2020-09-13 DIAGNOSIS — J441 Chronic obstructive pulmonary disease with (acute) exacerbation: Secondary | ICD-10-CM

## 2020-09-13 DIAGNOSIS — J449 Chronic obstructive pulmonary disease, unspecified: Secondary | ICD-10-CM | POA: Diagnosis not present

## 2020-09-13 DIAGNOSIS — J90 Pleural effusion, not elsewhere classified: Secondary | ICD-10-CM | POA: Diagnosis not present

## 2020-09-13 MED ORDER — PREDNISONE 10 MG PO TABS: ORAL_TABLET | ORAL | 0 refills | Status: DC

## 2020-09-13 MED ORDER — AMOXICILLIN-POT CLAVULANATE 875-125 MG PO TABS: 1.0000 | ORAL_TABLET | Freq: Two times a day (BID) | ORAL | 0 refills | Status: DC

## 2020-09-13 NOTE — Assessment & Plan Note (Signed)
Plan: Continue methotrexate Continue follow-up with rheumatology

## 2020-09-13 NOTE — Patient Instructions (Addendum)
You were seen today by Lauraine Rinne, NP  for:   1. COPD with acute exacerbation (HCC)  - amoxicillin-clavulanate (AUGMENTIN) 875-125 MG tablet; Take 1 tablet by mouth 2 (two) times daily.  Dispense: 14 tablet; Refill: 0 - DG Chest 2 View; Future  Augmentin >>> Take 1 875-125 mg tablet every 12 hours for the next 7 days >>> Take with food  Prednisone 10mg  tablet  >>>4 tabs for 2 days, then 3 tabs for 2 days, 2 tabs for 2 days, then 1 tab for 2 days, then stop >>>take with food  >>>take in the morning   Symbicort 160 >>> 2 puffs in the morning right when you wake up, rinse out your mouth after use, 12 hours later 2 puffs, rinse after use >>> Take this daily, no matter what >>> This is not a rescue inhaler   Spiriva Respimat 2.5 >>> 2 puffs daily >>> Do this every day >>>This is not a rescue inhaler  Note your daily symptoms > remember "red flags" for COPD:   >>>Increase in cough >>>increase in sputum production >>>increase in shortness of breath or activity  intolerance.   If you notice these symptoms, please call the office to be seen.        We recommend today:  Orders Placed This Encounter  Procedures  . DG Chest 2 View    Standing Status:   Future    Number of Occurrences:   1    Standing Expiration Date:   01/13/2021    Order Specific Question:   Reason for Exam (SYMPTOM  OR DIAGNOSIS REQUIRED)    Answer:   cough    Order Specific Question:   Preferred imaging location?    Answer:   Internal    Order Specific Question:   Radiology Contrast Protocol - do NOT remove file path    Answer:   [email or web address]   Orders Placed This Encounter  Procedures  . DG Chest 2 View   Meds ordered this encounter  Medications  . amoxicillin-clavulanate (AUGMENTIN) 875-125 MG tablet    Sig: Take 1 tablet by mouth 2 (two) times daily.    Dispense:  14 tablet    Refill:  0    Follow Up:    Return in about 4 weeks  (around 10/11/2020), or if symptoms worsen or fail to improve, for Follow up with Wyn Quaker FNP-C, Follow up with Dr. Ander Slade.   Notification of test results are managed in the following manner: If there are  any recommendations or changes to the  plan of care discussed in office today,  we will contact you and let you know what they are. If you do not hear from [place], then your results are normal and you can view them through your  MyChart account , or a letter will be sent to you. Thank you again for trusting [place] with your care  - Thank you, [facility]    It is flu season:   >>> Best ways to protect herself from the flu: Receive the yearly flu vaccine, practice good hand hygiene washing with soap and also using hand sanitizer when available, eat a nutritious meals, get adequate rest, hydrate appropriately       Please contact the office if your symptoms worsen or you have concerns that you are not improving.   Thank you for choosing [facility] for your healthcare, and for allowing [place] to partner with you on your healthcare journey. I am thankful to  be able to provide care to you today.   Wyn Quaker FNP-C

## 2020-09-13 NOTE — Assessment & Plan Note (Signed)
Plan: Chest x-ray today Start Augmentin Prednisone taper Continue Symbicort 160 Continue Spiriva Respimat 2.5 4-week follow-up with our office

## 2020-09-13 NOTE — Addendum Note (Signed)
Addended by: Lauraine Rinne on: 09/13/2020 05:42 PM   Modules accepted: Orders

## 2020-09-13 NOTE — Progress Notes (Signed)
'@Patient'  ID: Lisa Vega, female    DOB: Sep 23, 1932, 86 y.o.   MRN: [id number]  Chief Complaint  Patient presents with  . Follow-up    pt has sob on exertion and fatigue and congestion    Referring provider: Binnie Rail, MD  HPI:  86 year old female former smoker followed in our office for COPD  PMH: Rheumatoid arthritis, hyperparathyroidism, diverticulitis, GERD, type 2 diabetes, Smoker/ Smoking History: Former smoker. Quit 2005. 20-pack-year smoking history. Maintenance: Symbicort 160. Spiriva Respimat 2.5 Pt of: Dr. Jenetta Vega  09/13/2020  - Visit   86 year old female former smoker upon our office for COPD. Patient is followed by Dr. Jenetta Vega. Patient completing 38-monthfollow-up with our office today. She did contact her office earlier this week reporting tightness around her chest. Unfortunately were unable to reach the patient back for follow-up.  Patient presenting to her office today.  Oxygen levels are stable.  Vital signs stable on office visit today.  She reports adherence to Symbicort 160 and Spiriva Respimat 2.5.  She is wondering if she can have an x-ray performed today as she is concerned that she may have acute worsening chest symptoms or pulmonary nodules.  We will discuss and evaluate this.  Questionaires / Pulmonary Flowsheets:   ACT:  No flowsheet data found.  MMRC: mMRC Dyspnea Scale mMRC Score  10/03/2019 2    Epworth:  No flowsheet data found.  Tests:   05/02/2020-chest x-ray-chronic lung changes as above, persistent elevation of right diaphragm postsurgical changes of right chest, asymmetrical apical scarring greater on right and chronic a situation of right upper lobe markings,  08/19/2020-pulmonary function test-FVC 1.99 (85% predicted), postbronchodilator ratio 62, postbronchodilator FEV1 1.28 (75% predicted), no bronchodilator response, mid flow reversibility, TLC 4.49 (91% addicted) DLCO 11.06 (48% predicted) >>>patient did use Symbicort prior to  testing    FENO:  No results found for: NITRICOXIDE  PFT: PFT Results Latest Ref Rng & Units 08/19/2018 01/24/2018  FVC-Pre L 1.99 2.02  FVC-Predicted Pre % 85 86  FVC-Post L 2.06 2.15  FVC-Predicted Post % 88 91  Pre FEV1/FVC % % 61 60  Post FEV1/FCV % % 62 63  FEV1-Pre L 1.21 1.20  FEV1-Predicted Pre % 70 69  FEV1-Post L 1.28 1.36  DLCO uncorrected ml/min/mmHg 11.06 11.52  DLCO UNC% % 48 50  DLCO corrected ml/min/mmHg 10.96 11.38  DLCO COR %Predicted % 47 49  DLVA Predicted % 72 70  TLC L 4.49 4.51  TLC % Predicted % 91 92  RV % Predicted % 103 101    WALK:  SIX MIN WALK 08/06/2017  Supplimental Oxygen during Test? (L/min) No  Tech Comments: normal pace/SOB//lmr    Imaging: No results found.  Lab Results:  CBC    Component Value Date/Time   WBC 5.3 08/01/2019 1215   RBC 3.68 (L) 08/01/2019 1215   HGB 12.4 08/01/2019 1215   HGB 12.6 08/10/2011 1457   HCT 37.6 08/01/2019 1215   HCT 36.9 08/10/2011 1457   PLT 254.0 08/01/2019 1215   PLT 188 08/10/2011 1457   MCV 102.3 (H) 08/01/2019 1215   MCV 99.5 08/10/2011 1457   MCH 32.3 10/11/2018 0415   MCHC 33.0 08/01/2019 1215   RDW 15.9 (H) 08/01/2019 1215   RDW 14.9 (H) 08/10/2011 1457   LYMPHSABS 2.4 08/01/2019 1215   LYMPHSABS 2.9 08/10/2011 1457   MONOABS 0.5 08/01/2019 1215   MONOABS 0.6 08/10/2011 1457   EOSABS 0.1 08/01/2019 1215   EOSABS 0.1 08/10/2011 1457  BASOSABS 0.1 08/01/2019 1215   BASOSABS 0.0 08/10/2011 1457    BMET    Component Value Date/Time   NA 137 02/02/2020 1144   NA 139 08/10/2011 1457   K 4.2 02/02/2020 1144   K 4.4 08/10/2011 1457   CL 101 02/02/2020 1144   CL 98 08/10/2011 1457   CO2 31 02/02/2020 1144   CO2 29 08/10/2011 1457   GLUCOSE 92 02/02/2020 1144   GLUCOSE 98 08/10/2011 1457   BUN 10 02/02/2020 1144   BUN 15 08/10/2011 1457   CREATININE 0.97 02/02/2020 1144   CREATININE 0.9 08/10/2011 1457   CALCIUM 9.4 02/02/2020 1144   CALCIUM 8.9 08/10/2011 1457    GFRNONAA >60 10/11/2018 0415   GFRAA >60 10/11/2018 0415    BNP No results found for: BNP  ProBNP    Component Value Date/Time   PROBNP 163.0 (H) 08/06/2017 1016    Specialty Problems      Pulmonary Problems   CARCINOMA, LUNG, NONSMALL CELL    Right upper lobectomy 2005 for [place] cancer. CT chest 2012 with no recurrence.       Dyspnea on exertion    Qualifier: Diagnosis of  By: Lisa Vega        COPD (chronic obstructive pulmonary disease) ([place])    Right upper lobectomy 2005 for [facility] cancer. PFT's 2013:  FEV1 1.49 (81%), ratio 60, TLC 86%, DLCO 65% (stable to better from 2007) Pulmonary rehab referral 08/2012 >> never heard from them Throat soreness with spiriva Pulmonary rehab referral 02/2014 Tried on spiriva respimat >> likes handihaler better.  CT chest 2012 with no recurrence.       Orthopnea   CAP (community acquired pneumonia)   Upper respiratory tract infection   COPD with acute exacerbation ([place])      Allergies  Allergen Reactions  . Codeine     REACTION: hallucinations  . Demerol   . Meperidine Hcl     REACTION: severe GI upset    Immunization History  Administered Date(s) Administered  . Fluad Quad(high Dose 65+) 12/08/2019  . Influenza Split 09/28/2011, 09/28/2015  . Influenza Whole 09/27/2012  . Influenza, High Dose Seasonal PF 10/28/2016, 01/12/2017, 09/22/2017, 10/03/2018  . Influenza,inj,Quad PF,6+ Mos 09/20/2013, 09/12/2014  . Pneumococcal Conjugate-13 10/24/2015  . Pneumococcal Polysaccharide-23 12/29/2007    Past Medical History:  Diagnosis Date  . Anxiety   . Arthritis    RA  . BPV (benign positional vertigo) 03/10/2018  . Cancer ([place])    lung ca  . Carotid stenosis, left 09/19/2018  . COPD (chronic obstructive pulmonary disease) ([place])   . Depression   . Dyspnea    with exertion  . GERD (gastroesophageal reflux disease)   . Headache syndrome 03/09/2019  . Hypercholesteremia   . Hypothyroidism   . Memory difficulties  10/28/2015  . Pneumonia     x3  last time 2017  . Pre-diabetes   . Rheumatoid arthritis(714.0)   . Thyroid disease   . Tremor 10/28/2015   Jaw tremor  . Uterine prolapse   . Vitamin D deficiency     Tobacco History: Social History   Tobacco Use  Smoking Status Former Smoker  . Packs/day: 1.00  . Years: 20.00  . Pack years: 20.00  . Types: Cigarettes  . Quit date: 11/10/2004  . Years since quitting: 15.8  Smokeless Tobacco Never Used   Counseling given: Not Answered   Continue to not smoke  Outpatient Encounter Medications as of 09/13/2020  Medication Sig  .  acetaminophen (TYLENOL) 325 MG tablet Take 650 mg by mouth every 6 (six) hours as needed for mild pain.   Marland Kitchen albuterol (PROAIR HFA) 108 (90 Base) MCG/ACT inhaler USE 1-2 PUFFS EVERY 4-6 HOURS AS NEEDED  . ALPRAZolam (XANAX) 0.25 MG tablet Take 1 tablet (0.25 mg total) by mouth daily as needed. for anxiety  . aspirin EC 81 MG tablet Take 81 mg by mouth daily.  . baclofen (LIORESAL) 10 MG tablet TAKE 1/2 TABLET BY MOUTH TWICE A DAY  . blood glucose meter kit and supplies KIT Dispense based on patient and insurance preference. Use up to four times daily as directed. (FOR E11.9).  . budesonide-formoterol (SYMBICORT) 160-4.5 MCG/ACT inhaler USE 2 INHALATIONS ORALLY   TWICE DAILY  . clidinium-chlordiazePOXIDE (LIBRAX) 5-2.5 MG capsule SMARTSIG:1 Capsule(s) By Mouth Every 12 Hours PRN  . famotidine (PEPCID) 40 MG tablet Take 1 tablet (40 mg total) by mouth daily.  . folic acid (FOLVITE) 1 MG tablet Take 1 mg by mouth daily.  Marland Kitchen gabapentin (NEURONTIN) 100 MG capsule Take 100 mg by mouth 3 (three) times daily.  Marland Kitchen glucose blood (ONETOUCH ULTRA) test strip Use to check blood sugars twice a day  . ibuprofen (ADVIL,MOTRIN) 200 MG tablet Take 200 mg by mouth every 6 (six) hours as needed for headache or mild pain.  Marland Kitchen linaclotide (LINZESS) 72 MCG capsule Take 72 mcg by mouth daily as needed (constipation).   . meclizine (ANTIVERT) 25  MG tablet Take 0.5-1 tablets (12.5-25 mg total) by mouth 3 (three) times daily as needed for dizziness.  . methotrexate 50 MG/2ML injection Inject 17.5 mg into the skin every Sunday. Patient is not taking it Sunday, October 13- patient reported  . mupirocin ointment (BACTROBAN) 2 % Place 1 application into the nose 2 (two) times daily.  . nitrofurantoin, macrocrystal-monohydrate, (MACROBID) 100 MG capsule Take 1 capsule (100 mg total) by mouth 2 (two) times daily.  . NONFORMULARY OR COMPOUNDED ITEM Estradiol .02% 1 ML Prefilled Applicator Sig: apply vaginally twice a week #90 Day Supply with 4 refills  . omeprazole (PRILOSEC) 40 MG capsule Take 40 mg by mouth daily.  [name] DELICA LANCETS 63F MISC USE TO CHECK SUGARS UP TO 4 TIMES A DAY  . sertraline (ZOLOFT) 100 MG tablet Take 1 tablet (100 mg total) by mouth daily. Overdue for follow-up appt must see provider for future refills  . SYNTHROID 88 MCG tablet TAKE 1 TABLET DAILY  . Tiotropium Bromide Monohydrate (SPIRIVA RESPIMAT) 2.5 MCG/ACT AERS Inhale 2 puffs into the lungs daily.  . traMADol (ULTRAM) 50 MG tablet Take 50 mg by mouth 3 (three) times daily as needed for moderate pain.   Marland Kitchen triamcinolone ointment (KENALOG) 0.1 % Apply 1 application topically 2 (two) times daily as needed.  . [DISCONTINUED] doxycycline (VIBRA-TABS) 100 MG tablet Take 1 tablet (100 mg total) by mouth 2 (two) times daily.  . [DISCONTINUED] predniSONE (DELTASONE) 10 MG tablet Take 1 tablet (10 mg total) by mouth 2 (two) times daily with a meal.  . [DISCONTINUED] predniSONE (DELTASONE) 20 MG tablet Take 1 tablet (20 mg total) by mouth daily with breakfast.  . [DISCONTINUED] pregabalin (LYRICA) 25 MG capsule Take 1 capsule (25 mg total) by mouth 2 (two) times daily.  Marland Kitchen amoxicillin-clavulanate (AUGMENTIN) 875-125 MG tablet Take 1 tablet by mouth 2 (two) times daily.  . predniSONE (DELTASONE) 10 MG tablet 4 tabs for 2 days, then 3 tabs for 2 days, 2 tabs for 2 days,  then 1 tab  for 2 days, then stop   No facility-administered encounter medications on file as of 09/13/2020.     Review of Systems  Review of Systems  Constitutional: Positive for fatigue. Negative for activity change and fever.  HENT: Positive for congestion. Negative for sinus pressure, sinus pain and sore throat.   Respiratory: Positive for cough, shortness of breath and wheezing.   Cardiovascular: Negative for chest pain and palpitations.  Gastrointestinal: Negative for diarrhea, nausea and vomiting.  Musculoskeletal: Negative for arthralgias.  Neurological: Negative for dizziness.  Psychiatric/Behavioral: Negative for sleep disturbance. The patient is not nervous/anxious.      Physical Exam  BP 110/68 (Cuff Size: Normal)   Pulse 73   Temp (!) 95 F (35 C) (Oral)   Ht '5\' 4"'  (1.626 m)   Wt 138 lb 12.8 oz (63 kg)   SpO2 93%   BMI 23.82 kg/m   Wt Readings from Last 5 Encounters:  09/13/20 138 lb 12.8 oz (63 kg)  08/06/20 140 lb (63.5 kg)  06/25/20 138 lb (62.6 kg)  05/02/20 138 lb 3.2 oz (62.7 kg)  02/02/20 136 lb (61.7 kg)    BMI Readings from Last 5 Encounters:  09/13/20 23.82 kg/m  08/06/20 24.03 kg/m  06/25/20 23.50 kg/m  05/02/20 23.54 kg/m  02/02/20 26.56 kg/m     Physical Exam Vitals and nursing note reviewed.  Constitutional:      General: She is not in acute distress.    Appearance: Normal appearance. She is normal weight.  HENT:     Head: Normocephalic and atraumatic.     Right Ear: External ear normal.     Left Ear: External ear normal.     Nose: Nose normal. No congestion.     Mouth/Throat:     Mouth: Mucous membranes are moist.     Pharynx: Oropharynx is clear.  Eyes:     Pupils: Pupils are equal, round, and reactive to light.  Cardiovascular:     Rate and Rhythm: Normal rate and regular rhythm.     Pulses: Normal pulses.     Heart sounds: Normal heart sounds. No murmur heard.   Pulmonary:     Breath sounds: No decreased air  movement. Examination of the right-lower field reveals decreased breath sounds. Examination of the left-lower field reveals rales. Decreased breath sounds and rales present. No wheezing.  Musculoskeletal:     Cervical back: Normal range of motion.  Skin:    General: Skin is warm and dry.     Capillary Refill: Capillary refill takes less than 2 seconds.  Neurological:     General: No focal deficit present.     Mental Status: She is alert and oriented to person, place, and time. Mental status is at baseline.     Gait: Gait normal.  Psychiatric:        Mood and Affect: Mood normal.        Behavior: Behavior normal.        Thought Content: Thought content normal.        Judgment: Judgment normal.       Assessment & Plan:   COPD with acute exacerbation ([place]) Plan: Chest x-ray today Start Augmentin Prednisone taper Continue Symbicort 160 Continue Spiriva Respimat 2.5 4-week follow-up with our office  Rheumatoid arthritis ([place]) Plan: Continue methotrexate Continue follow-up with rheumatology    Return in about 4 weeks (around 10/11/2020), or if symptoms worsen or fail to improve, for Follow up with Wyn Quaker FNP-C, Follow up with Dr.  Olalere.   Lauraine Rinne, NP 09/13/2020   This appointment required 34 minutes of patient care (this includes precharting, chart review, review of results, face-to-face care, etc.).

## 2020-09-16 ENCOUNTER — Telehealth: Payer: Self-pay | Admitting: Pulmonary Disease

## 2020-09-16 NOTE — Telephone Encounter (Signed)
Spoke with Diane from [facility] regarding XRAY from Friday. Per Aaron Edelman CT was ordered for further evaluation. Nothing else further needed.

## 2020-09-16 NOTE — Telephone Encounter (Signed)
Pt had OV with Aaron Edelman 09/13/20. Pt had cxr performed which radiology recommended pt to have a CT to further evaluate. Called and spoke with pt letting her know the results of the cxr and that we were going to have her get a CT to further evaluate and she verbalized understanding. Nothing further needed.

## 2020-09-16 NOTE — Telephone Encounter (Signed)
Patient is returning phone call. Patient phone number is [phone].

## 2020-09-23 ENCOUNTER — Other Ambulatory Visit: Payer: Self-pay | Admitting: Internal Medicine

## 2020-09-26 ENCOUNTER — Ambulatory Visit
Admission: RE | Admit: 2020-09-26 | Discharge: 2020-09-26 | Disposition: A | Discharge status: Home / Self Care | Payer: Medicare Other | Source: Ambulatory Visit | Attending: Pulmonary Disease | Admitting: Pulmonary Disease

## 2020-09-26 ENCOUNTER — Other Ambulatory Visit: Payer: Self-pay

## 2020-09-26 DIAGNOSIS — R918 Other nonspecific abnormal finding of lung field: Secondary | ICD-10-CM

## 2020-10-01 ENCOUNTER — Other Ambulatory Visit: Payer: Self-pay | Admitting: Pulmonary Disease

## 2020-10-01 ENCOUNTER — Telehealth: Payer: Self-pay | Admitting: Pulmonary Disease

## 2020-10-01 MED ORDER — BENZONATATE 200 MG PO CAPS: 200.0000 mg | ORAL_CAPSULE | Freq: Three times a day (TID) | ORAL | 0 refills | Status: DC | PRN

## 2020-10-01 NOTE — Progress Notes (Signed)
LVM 10/5

## 2020-10-01 NOTE — Progress Notes (Signed)
Lisa Vega

## 2020-10-02 NOTE — Telephone Encounter (Signed)
ATC daughter on 10/01/20, left VM and requested a return call.

## 2020-10-03 NOTE — Telephone Encounter (Signed)
Results given to pt. See result note. Nothing further needed at this time.

## 2020-10-07 ENCOUNTER — Other Ambulatory Visit: Payer: Self-pay | Admitting: Internal Medicine

## 2020-10-08 ENCOUNTER — Encounter: Payer: Self-pay | Admitting: Nurse Practitioner

## 2020-10-08 ENCOUNTER — Other Ambulatory Visit: Payer: Self-pay

## 2020-10-08 ENCOUNTER — Ambulatory Visit (INDEPENDENT_AMBULATORY_CARE_PROVIDER_SITE_OTHER): Payer: Medicare Other | Admitting: Nurse Practitioner

## 2020-10-08 ENCOUNTER — Other Ambulatory Visit: Payer: Self-pay | Admitting: Nurse Practitioner

## 2020-10-08 VITALS — BP 120/74

## 2020-10-08 DIAGNOSIS — Z4689 Encounter for fitting and adjustment of other specified devices: Secondary | ICD-10-CM

## 2020-10-08 DIAGNOSIS — N8189 Other female genital prolapse: Secondary | ICD-10-CM

## 2020-10-08 DIAGNOSIS — R300 Dysuria: Secondary | ICD-10-CM

## 2020-10-08 DIAGNOSIS — N898 Other specified noninflammatory disorders of vagina: Secondary | ICD-10-CM

## 2020-10-08 DIAGNOSIS — B373 Candidiasis of vulva and vagina: Secondary | ICD-10-CM

## 2020-10-08 DIAGNOSIS — B3731 Acute candidiasis of vulva and vagina: Secondary | ICD-10-CM

## 2020-10-08 DIAGNOSIS — N3001 Acute cystitis with hematuria: Secondary | ICD-10-CM

## 2020-10-08 LAB — WET PREP FOR TRICH, YEAST, CLUE

## 2020-10-08 MED ORDER — SULFAMETHOXAZOLE-TRIMETHOPRIM 800-160 MG PO TABS: 1.0000 | ORAL_TABLET | Freq: Two times a day (BID) | ORAL | 0 refills | Status: AC

## 2020-10-08 MED ORDER — FLUCONAZOLE 150 MG PO TABS: 150.0000 mg | ORAL_TABLET | ORAL | 0 refills | Status: DC

## 2020-10-08 NOTE — Patient Instructions (Addendum)
Vaginal Yeast Infection, Adult  Vaginal yeast infection is a condition that causes vaginal discharge as well as soreness, swelling, and redness (inflammation) of the vagina. This is a common condition. Some women get this infection frequently. What are the causes? This condition is caused by a change in the normal balance of the yeast (candida) and bacteria that live in the vagina. This change causes an overgrowth of yeast, which causes the inflammation. What increases the risk? The condition is more likely to develop in women who:  Take antibiotic medicines.  Have diabetes.  Take birth control pills.  Are pregnant.  Douche often.  Have a weak body defense system (immune system).  Have been taking steroid medicines for a long time.  Frequently wear tight clothing. What are the signs or symptoms? Symptoms of this condition include:  White, thick, creamy vaginal discharge.  Swelling, itching, redness, and irritation of the vagina. The lips of the vagina (vulva) may be affected as well.  Pain or a burning feeling while urinating.  Pain during sex. How is this diagnosed? This condition is diagnosed based on:  Your medical history.  A physical exam.  A pelvic exam. Your health care provider will examine a sample of your vaginal discharge under a microscope. Your health care provider may send this sample for testing to confirm the diagnosis. How is this treated? This condition is treated with medicine. Medicines may be over-the-counter or prescription. You may be told to use one or more of the following:  Medicine that is taken by mouth (orally).  Medicine that is applied as a cream (topically).  Medicine that is inserted directly into the vagina (suppository). Follow these instructions at home:  Lifestyle  Do not have sex until your health care provider approves. Tell your sex partner that you have a yeast infection. That person should go to his or her health care  provider and ask if they should also be treated.  Do not wear tight clothes, such as pantyhose or tight pants.  Wear breathable cotton underwear. General instructions  Take or apply over-the-counter and prescription medicines only as told by your health care provider.  Eat more yogurt. This may help to keep your yeast infection from returning.  Do not use tampons until your health care provider approves.  Try taking a sitz bath to help with discomfort. This is a warm water bath that is taken while you are sitting down. The water should only come up to your hips and should cover your buttocks. Do this 3-4 times per day or as told by your health care provider.  Do not douche.  If you have diabetes, keep your blood sugar levels under control.  Keep all follow-up visits as told by your health care provider. This is important. Contact a health care provider if:  You have a fever.  Your symptoms go away and then return.  Your symptoms do not get better with treatment.  Your symptoms get worse.  You have new symptoms.  You develop blisters in or around your vagina.  You have blood coming from your vagina and it is not your menstrual period.  You develop pain in your abdomen. Summary  Vaginal yeast infection is a condition that causes discharge as well as soreness, swelling, and redness (inflammation) of the vagina.  This condition is treated with medicine. Medicines may be over-the-counter or prescription.  Take or apply over-the-counter and prescription medicines only as told by your health care provider.  Do not douche.   Do not have sex or use tampons until your health care provider approves.  Contact a health care provider if your symptoms do not get better with treatment or your symptoms go away and then return. This information is not intended to replace advice given to you by your health care provider. Make sure you discuss any questions you have with your health care  provider. Document Revised: 07/14/2019 Document Reviewed: 05/02/2018 Elsevier Patient Education  2020 Elsevier Inc.  Urinary Tract Infection, Adult A urinary tract infection (UTI) is an infection of any part of the urinary tract. The urinary tract includes:  The kidneys.  The ureters.  The bladder.  The urethra. These organs make, store, and get rid of pee (urine) in the body. What are the causes? This is caused by germs (bacteria) in your genital area. These germs grow and cause swelling (inflammation) of your urinary tract. What increases the risk? You are more likely to develop this condition if:  You have a small, thin tube (catheter) to drain pee.  You cannot control when you pee or poop (incontinence).  You are female, and: ? You use these methods to prevent pregnancy:  A medicine that kills sperm (spermicide).  A device that blocks sperm (diaphragm). ? You have low levels of a female hormone (estrogen). ? You are pregnant.  You have genes that add to your risk.  You are sexually active.  You take antibiotic medicines.  You have trouble peeing because of: ? A prostate that is bigger than normal, if you are female. ? A blockage in the part of your body that drains pee from the bladder (urethra). ? A kidney stone. ? A nerve condition that affects your bladder (neurogenic bladder). ? Not getting enough to drink. ? Not peeing often enough.  You have other conditions, such as: ? Diabetes. ? A weak disease-fighting system (immune system). ? Sickle cell disease. ? Gout. ? Injury of the spine. What are the signs or symptoms? Symptoms of this condition include:  Needing to pee right away (urgently).  Peeing often.  Peeing small amounts often.  Pain or burning when peeing.  Blood in the pee.  Pee that smells bad or not like normal.  Trouble peeing.  Pee that is cloudy.  Fluid coming from the vagina, if you are female.  Pain in the belly or lower  back. Other symptoms include:  Throwing up (vomiting).  No urge to eat.  Feeling mixed up (confused).  Being tired and grouchy (irritable).  A fever.  Watery poop (diarrhea). How is this treated? This condition may be treated with:  Antibiotic medicine.  Other medicines.  Drinking enough water. Follow these instructions at home:  Medicines  Take over-the-counter and prescription medicines only as told by your doctor.  If you were prescribed an antibiotic medicine, take it as told by your doctor. Do not stop taking it even if you start to feel better. General instructions  Make sure you: ? Pee until your bladder is empty. ? Do not hold pee for a long time. ? Empty your bladder after sex. ? Wipe from front to back after pooping if you are a female. Use each tissue one time when you wipe.  Drink enough fluid to keep your pee pale yellow.  Keep all follow-up visits as told by your doctor. This is important. Contact a doctor if:  You do not get better after 1-2 days.  Your symptoms go away and then come back. Get help right   away if:  You have very bad back pain.  You have very bad pain in your lower belly.  You have a fever.  You are sick to your stomach (nauseous).  You are throwing up. Summary  A urinary tract infection (UTI) is an infection of any part of the urinary tract.  This condition is caused by germs in your genital area.  There are many risk factors for a UTI. These include having a small, thin tube to drain pee and not being able to control when you pee or poop.  Treatment includes antibiotic medicines for germs.  Drink enough fluid to keep your pee pale yellow. This information is not intended to replace advice given to you by your health care provider. Make sure you discuss any questions you have with your health care provider. Document Revised: 12/01/2018 Document Reviewed: 06/23/2018 Elsevier Patient Education  2020 Elsevier Inc.  

## 2020-10-08 NOTE — Addendum Note (Signed)
Addended by: Lorine Bears on: 10/08/2020 12:32 PM   Modules accepted: Orders

## 2020-10-08 NOTE — Progress Notes (Signed)
   Acute Office Visit  Subjective:    Patient ID: Lisa Vega, female    DOB: [birth year], 86 y.o.   MRN: [id number]   HPI 86 y.o. presents today for lower abdominal pressure and burning with urination that started 3 days ago. She also noticed an increase in vaginal discharge a week ago that is yellow and odorless. She would like her pessary cleaned and reinserted. She removed for a couple of weeks but was having difficulty urinating and was having to manually reduce the prolapse.    Review of Systems  Constitutional: Negative.   Gastrointestinal: Positive for abdominal pain (pressure).  Genitourinary: Positive for difficulty urinating, dysuria, frequency and vaginal discharge. Negative for urgency and vaginal pain.       Objective:    Physical Exam Constitutional:      Appearance: Normal appearance.  Abdominal:     Tenderness: There is no right CVA tenderness or left CVA tenderness.  Genitourinary:    General: Normal vulva.     Vagina: Vaginal discharge present. No erythema.     Uterus: With uterine prolapse.      Comments: Vaginal atrophy. No erosions or erythema. Pessary removed and reinserted without difficulty    BP 120/74 (BP Location: Right Arm, Patient Position: Sitting, Cuff Size: Normal)  Wt Readings from Last 3 Encounters:  09/13/20 138 lb 12.8 oz (63 kg)  08/06/20 140 lb (63.5 kg)  06/25/20 138 lb (62.6 kg)   Wet prep + yeast UA 1+ leukocytes, 1+ blood, wbc >60, rbc 10-20, many bacteria     Assessment & Plan:   Problem List Items Addressed This Visit    None    Visit Diagnoses    Pessary maintenance    -  Primary   Pelvic relaxation       Dysuria       Relevant Orders   Urinalysis,Complete w/RFL Culture   Vaginal candidiasis       Relevant Medications   sulfamethoxazole-trimethoprim (BACTRIM DS) 800-160 MG tablet   fluconazole (DIFLUCAN) 150 MG tablet   Acute cystitis with hematuria       Relevant Medications   sulfamethoxazole-trimethoprim  (BACTRIM DS) 800-160 MG tablet     Plan: Bactrim 800-160 mg twice a day for 5 days. Urine culture pending. Increase water intake. Diflucan 150 mg today and repeat in 3 days for total of 2 doses. Pessary removed, cleaned, and reinserted with ease. Discussed refitting for new pessary as it seems to fit loosely. She would like to wait since it does not fall out, her symptoms have not changed, and it seems to affect her bowel movements so she is concerned that a larger one will make it more difficult. If symptoms worsen we will refit. Return in 3 months for pessary check or as needed if symptoms worsen or do not improve. She is agreeable to plan.      Tamela Gammon [facility], 11:45 AM 10/08/2020

## 2020-10-11 ENCOUNTER — Ambulatory Visit: Payer: Medicare Other | Admitting: Pulmonary Disease

## 2020-10-11 LAB — URINALYSIS, COMPLETE W/RFL CULTURE
Bilirubin Urine: NEGATIVE
Glucose, UA: NEGATIVE
Hyaline Cast: NONE SEEN /LPF
Ketones, ur: NEGATIVE
Nitrites, Initial: NEGATIVE
Protein, ur: NEGATIVE
Specific Gravity, Urine: 1.015 (ref 1.001–1.03)
WBC, UA: >=60 /HPF — AB (ref 0–5)
pH: 5.5 (ref 5.0–8.0)

## 2020-10-11 LAB — URINE CULTURE
MICRO NUMBER:: 11061203
SPECIMEN QUALITY:: ADEQUATE

## 2020-10-11 LAB — CULTURE INDICATED

## 2020-10-13 ENCOUNTER — Other Ambulatory Visit: Payer: Self-pay | Admitting: Nurse Practitioner

## 2020-10-13 DIAGNOSIS — N3001 Acute cystitis with hematuria: Secondary | ICD-10-CM

## 2020-10-13 MED ORDER — NITROFURANTOIN MONOHYD MACRO 100 MG PO CAPS: 100.0000 mg | ORAL_CAPSULE | Freq: Two times a day (BID) | ORAL | 0 refills | Status: DC

## 2020-10-14 ENCOUNTER — Other Ambulatory Visit: Payer: Self-pay | Admitting: Nurse Practitioner

## 2020-10-14 ENCOUNTER — Telehealth: Payer: Self-pay

## 2020-10-14 DIAGNOSIS — B373 Candidiasis of vulva and vagina: Secondary | ICD-10-CM

## 2020-10-14 DIAGNOSIS — B3731 Acute candidiasis of vulva and vagina: Secondary | ICD-10-CM

## 2020-10-14 MED ORDER — FLUCONAZOLE 150 MG PO TABS: 150.0000 mg | ORAL_TABLET | ORAL | 0 refills | Status: DC

## 2020-10-14 NOTE — Telephone Encounter (Signed)
I have sent Diflucan 2 tablets to her pharmacy. Thank you

## 2020-10-14 NOTE — Telephone Encounter (Signed)
-----   Message from Tamela Gammon, NP sent at 10/13/2020  9:16 PM EDT ----- Please let Lisa Vega know her urine culture came back showing resistance to the antibiotic we put her on while waiting for the culture. I have sent Nitrofurantion to her pharmacy twice a day for 7 days. Thank you

## 2020-10-14 NOTE — Telephone Encounter (Signed)
Patient was informed. She asked if you would send her two Diflucan tabs as well. She said she needs one now and would like to have the second one to take in the next week with the second antibiotic.

## 2020-10-17 ENCOUNTER — Ambulatory Visit: Payer: Medicare Other | Admitting: Pulmonary Disease

## 2020-10-21 ENCOUNTER — Other Ambulatory Visit: Payer: Self-pay | Admitting: *Deleted

## 2020-10-21 DIAGNOSIS — I6523 Occlusion and stenosis of bilateral carotid arteries: Secondary | ICD-10-CM

## 2020-10-31 ENCOUNTER — Ambulatory Visit (INDEPENDENT_AMBULATORY_CARE_PROVIDER_SITE_OTHER): Payer: Medicare Other | Admitting: Pulmonary Disease

## 2020-10-31 ENCOUNTER — Other Ambulatory Visit: Payer: Self-pay

## 2020-10-31 ENCOUNTER — Encounter: Payer: Self-pay | Admitting: Pulmonary Disease

## 2020-10-31 VITALS — BP 120/56 | HR 67 | Temp 97.4°F | Ht 61.25 in | Wt 140.6 lb

## 2020-10-31 DIAGNOSIS — J449 Chronic obstructive pulmonary disease, unspecified: Secondary | ICD-10-CM

## 2020-10-31 DIAGNOSIS — Z23 Encounter for immunization: Secondary | ICD-10-CM

## 2020-10-31 NOTE — Progress Notes (Signed)
Lisa Vega    [id number]    [birth year]  Primary Care Physician:Vega, Lisa Lick, MD  Referring Physician: Binnie Rail, MD [address] [place],  [place] [address]  Chief complaint:  Patient with a history of COPD  HPI: History of COPD on Spiriva and Symbicort Rarely uses Spiriva Rarely uses ProAir Uses Symbicort on a regular basis  Has had a cough with productive phlegm Has not felt acutely ill  Denies any chest pains or chest discomfort  Had a CT scan September 2021 -reviewed, unchanged from previous  Breathing has been relatively stable She is not having to restrict activities  She does not perform any graded exercises on a regular basis, tries to walk regularly   Outpatient Encounter Medications as of 10/31/2020  Medication Sig  . acetaminophen (TYLENOL) 325 MG tablet Take 650 mg by mouth every 6 (six) hours as needed for mild pain.   Marland Kitchen albuterol (PROAIR HFA) 108 (90 Base) MCG/ACT inhaler USE 1-2 PUFFS EVERY 4-6 HOURS AS NEEDED  . ALPRAZolam (XANAX) 0.25 MG tablet TAKE 1 TABLET BY MOUTH EVERY DAY AS NEEDED FOR ANXIETY  . aspirin EC 81 MG tablet Take 81 mg by mouth daily.  . baclofen (LIORESAL) 10 MG tablet TAKE 1/2 TABLET BY MOUTH TWICE A DAY  . benzonatate (TESSALON) 200 MG capsule Take 1 capsule (200 mg total) by mouth 3 (three) times daily as needed for cough.  . blood glucose meter kit and supplies KIT Dispense based on patient and insurance preference. Use up to four times daily as directed. (FOR E11.9).  . budesonide-formoterol (SYMBICORT) 160-4.5 MCG/ACT inhaler USE 2 INHALATIONS ORALLY   TWICE DAILY  . clidinium-chlordiazePOXIDE (LIBRAX) 5-2.5 MG capsule SMARTSIG:1 Capsule(s) By Mouth Every 12 Hours PRN  . famotidine (PEPCID) 40 MG tablet Take 1 tablet (40 mg total) by mouth daily.  . fluconazole (DIFLUCAN) 150 MG tablet Take 1 tablet (150 mg total) by mouth every 3 (three) days.  . folic acid (FOLVITE) 1 MG tablet Take 1 mg by mouth daily.    Marland Kitchen gabapentin (NEURONTIN) 100 MG capsule Take 100 mg by mouth 3 (three) times daily.  Marland Kitchen glucose blood (ONETOUCH ULTRA) test strip Use to check blood sugars twice a day  . ibuprofen (ADVIL,MOTRIN) 200 MG tablet Take 200 mg by mouth every 6 (six) hours as needed for headache or mild pain.  Marland Kitchen linaclotide (LINZESS) 72 MCG capsule Take 72 mcg by mouth daily as needed (constipation).   . meclizine (ANTIVERT) 25 MG tablet Take 0.5-1 tablets (12.5-25 mg total) by mouth 3 (three) times daily as needed for dizziness.  . methotrexate 50 MG/2ML injection Inject 17.5 mg into the skin every Sunday. Patient is not taking it Sunday, October 13- patient reported  . mupirocin ointment (BACTROBAN) 2 % Place 1 application into the nose 2 (two) times daily.  . nitrofurantoin, macrocrystal-monohydrate, (MACROBID) 100 MG capsule Take 1 capsule (100 mg total) by mouth 2 (two) times daily.  . NONFORMULARY OR COMPOUNDED ITEM Estradiol .02% 1 ML Prefilled Applicator Sig: apply vaginally twice a week #90 Day Supply with 4 refills  . omeprazole (PRILOSEC) 40 MG capsule Take 40 mg by mouth daily.  Lisa Vega DELICA LANCETS 40N MISC USE TO CHECK SUGARS UP TO 4 TIMES A DAY  . predniSONE (DELTASONE) 10 MG tablet 4 tabs for 2 days, then 3 tabs for 2 days, 2 tabs for 2 days, then 1 tab for 2 days, then stop  .  sertraline (ZOLOFT) 100 MG tablet TAKE 1 TABLET BY MOUTH EVERY DAY NEED APPT FOR FUTURE REFILLS  . SYNTHROID 88 MCG tablet TAKE 1 TABLET DAILY  . Tiotropium Bromide Monohydrate (SPIRIVA RESPIMAT) 2.5 MCG/ACT AERS Inhale 2 puffs into the lungs daily.  . traMADol (ULTRAM) 50 MG tablet Take 50 mg by mouth 3 (three) times daily as needed for moderate pain.   Marland Kitchen triamcinolone ointment (KENALOG) 0.1 % Apply 1 application topically 2 (two) times daily as needed.   No facility-administered encounter medications on file as of 10/31/2020.    Allergies as of 10/31/2020 - Review Complete 10/31/2020  Allergen Reaction Noted  .  Codeine    . Demerol  08/10/2011  . Meperidine hcl      Past Medical History:  Diagnosis Date  . Anxiety   . Arthritis    RA  . BPV (benign positional vertigo) 03/10/2018  . Cancer ([place])    lung ca  . Carotid stenosis, left 09/19/2018  . COPD (chronic obstructive pulmonary disease) ([place])   . Depression   . Dyspnea    with exertion  . GERD (gastroesophageal reflux disease)   . Headache syndrome 03/09/2019  . Hypercholesteremia   . Hypothyroidism   . Memory difficulties 10/28/2015  . Pneumonia     x3  last time 2017  . Pre-diabetes   . Rheumatoid arthritis(714.0)   . Thyroid disease   . Tremor 10/28/2015   Jaw tremor  . Uterine prolapse   . Vitamin D deficiency     Past Surgical History:  Procedure Laterality Date  . ANGIOPLASTY Left 10/10/2018   Procedure: ANGIOPLASTY using 1cm x 14cm Xenosure Patch;  Surgeon: Lisa Heck, MD;  Location: [facility];  Service: Vascular;  Laterality: Left;  . APPENDECTOMY    . COLONOSCOPY W/ POLYPECTOMY    . ENDARTERECTOMY Left 10/10/2018   Procedure: ENDARTERECTOMY CAROTID;  Surgeon: Lisa Heck, MD;  Location: [facility];  Service: Vascular;  Laterality: Left;  . EYE SURGERY Bilateral    cataract  . FOOT SURGERY Bilateral    arthritis  . Hands  Bilateral    Arthritis  . HEMORRHOID SURGERY    . right lobectomy  10/2004  . TONSILLECTOMY AND ADENOIDECTOMY      Family History  Problem Relation Age of Onset  . Emphysema Mother   . Asthma Mother   . Lung cancer Father   . Pancreatic cancer Father   . Bone cancer Father   . Heart disease Brother        x1  . Heart disease Sister        x2  . Stroke Maternal Grandfather     Social History   Socioeconomic History  . Marital status: Widowed    Spouse name: Not on file  . Number of children: 3  . Years of education: LPN  . Highest education level: Not on file  Occupational History  . Occupation: retired    Fish farm manager: RETIRED    Comment: LPN at [employer]  Tobacco Use  . Smoking status: Former Smoker    Packs/day: 1.00    Years: 20.00    Pack years: 20.00    Types: Cigarettes    Quit date: 11/10/2004    Years since quitting: 15.9  . Smokeless tobacco: Never Used  Vaping Use  . Vaping Use: Never used  Substance and Sexual Activity  . Alcohol use: Yes    Alcohol/week: 1.0 standard drink    Types: 1 Glasses  of wine per week    Comment: socially  . Drug use: No  . Sexual activity: Not Currently    Comment: 1st intercourse- 19, partners- 5,   Other Topics Concern  . Not on file  Social History Narrative   Patient drinks about 3 cups of coffee daily.   Patient is right handed.    Social Determinants of Health   Financial Resource Strain: Low Risk   . Difficulty of Paying Living Expenses: Not very hard  Food Insecurity:   . Worried About Charity fundraiser in the Last Year: Not on file  . Ran Out of Food in the Last Year: Not on file  Transportation Needs:   . Lack of Transportation (Medical): Not on file  . Lack of Transportation (Non-Medical): Not on file  Physical Activity:   . Days of Exercise per Week: Not on file  . Minutes of Exercise per Session: Not on file  Stress:   . Feeling of Stress : Not on file  Social Connections:   . Frequency of Communication with Friends and Family: Not on file  . Frequency of Social Gatherings with Friends and Family: Not on file  . Attends Religious Services: Not on file  . Active Member of Clubs or Organizations: Not on file  . Attends Archivist Meetings: Not on file  . Marital Status: Not on file  Intimate Partner Violence:   . Fear of Current or Ex-Partner: Not on file  . Emotionally Abused: Not on file  . Physically Abused: Not on file  . Sexually Abused: Not on file    Review of Systems  Constitutional: Negative.   HENT: Negative.   Eyes: Negative.   Respiratory: Negative.  Negative for apnea, cough and shortness of breath.   Cardiovascular: Negative  for chest pain and leg swelling.  Gastrointestinal: Negative.   Endocrine: Negative.   Genitourinary: Negative.     There were no vitals filed for this visit.   Physical Exam Constitutional:      Appearance: She is well-developed.  HENT:     Head: Normocephalic and atraumatic.  Eyes:     Pupils: Pupils are equal, round, and reactive to light.  Neck:     Thyroid: No thyromegaly.     Trachea: No tracheal deviation.  Cardiovascular:     Rate and Rhythm: Normal rate.  Pulmonary:     Effort: Pulmonary effort is normal. No respiratory distress.     Breath sounds: Normal breath sounds. No stridor. No wheezing, rhonchi or rales.  Musculoskeletal:     Cervical back: No rigidity or tenderness.  Psychiatric:        Mood and Affect: Mood normal.    Data Reviewed: Last PFT was 08/19/2018-mild obstructive lung disease  CT reviewed and discussed with patient showing some fibro-nodular changes that were stable from previous  Assessment:  COPD -Continue Spiriva -Continues Symbicort  Rheumatoid arthritis -Chronically on methotrexate  Plan for and influenza and pneumococcal vaccination today  Plan/Recommendations: Encouraged to call with significant symptoms Vaccination administered today-influenza and pneumococcal 13  Encouraged to continue with Symbicort and Spiriva, rescue inhaler use as needed  Call with significant concerns  I will see her back in the office in about 6 months   Sherrilyn Rist MD [facility] 10/31/2020, 12:01 PM  CC: Lisa Rail, MD

## 2020-10-31 NOTE — Patient Instructions (Addendum)
Administer influenza vaccine today Administer pneumococcal vaccine 13 Valent today  Encouraged to use Spiriva and Symbicort Rescue inhaler use as needed  I will see you in  months  Call with significant concerns  No significant concerning finding on your CT as we reviewed in the office today

## 2020-11-14 DIAGNOSIS — M0579 Rheumatoid arthritis with rheumatoid factor of multiple sites without organ or systems involvement: Secondary | ICD-10-CM | POA: Diagnosis not present

## 2020-11-15 DIAGNOSIS — M5030 Other cervical disc degeneration, unspecified cervical region: Secondary | ICD-10-CM | POA: Diagnosis not present

## 2020-11-15 DIAGNOSIS — M170 Bilateral primary osteoarthritis of knee: Secondary | ICD-10-CM | POA: Diagnosis not present

## 2020-11-15 DIAGNOSIS — M1712 Unilateral primary osteoarthritis, left knee: Secondary | ICD-10-CM | POA: Diagnosis not present

## 2020-11-19 ENCOUNTER — Ambulatory Visit (INDEPENDENT_AMBULATORY_CARE_PROVIDER_SITE_OTHER): Payer: Medicare Other | Admitting: Vascular Surgery

## 2020-11-19 ENCOUNTER — Other Ambulatory Visit: Payer: Self-pay

## 2020-11-19 ENCOUNTER — Encounter: Payer: Self-pay | Admitting: Vascular Surgery

## 2020-11-19 ENCOUNTER — Ambulatory Visit (HOSPITAL_COMMUNITY)
Admission: RE | Admit: 2020-11-19 | Discharge: 2020-11-19 | Disposition: A | Discharge status: Home / Self Care | Payer: Medicare Other | Source: Ambulatory Visit | Attending: Vascular Surgery | Admitting: Vascular Surgery

## 2020-11-19 VITALS — BP 132/88 | HR 60 | Temp 97.7°F | Resp 14 | Ht 62.0 in | Wt 138.0 lb

## 2020-11-19 DIAGNOSIS — I6522 Occlusion and stenosis of left carotid artery: Secondary | ICD-10-CM | POA: Diagnosis not present

## 2020-11-19 DIAGNOSIS — I6523 Occlusion and stenosis of bilateral carotid arteries: Secondary | ICD-10-CM | POA: Insufficient documentation

## 2020-11-19 NOTE — Progress Notes (Signed)
Patient name: Lisa Vega MRN: [id number] DOB: 09-27-1932 Sex: female  REASON FOR VISIT: 1 year follow-up for surveillance of carotid disease  HPI: Lisa Vega is a 86 y.o. female who presents for 1 year follow-up and ongoing surveillance of her carotid artery disease.  She had a left carotid endarterectomy with patch angioplasty on 09/30/2018 for symptomatic high-grade stenosis.  She was on Plavix 1 month postop and then this was discontinued and she remains on aspirin.  She has had no new neurologic events since we saw her last year.  She denies any focal weakness or numbness in her arm or leg or vision loss.  Overall she feels she is doing very well.  She continues to have some residual numbness under her left chin.  Past Medical History:  Diagnosis Date  . Anxiety   . Arthritis    RA  . BPV (benign positional vertigo) 03/10/2018  . Cancer ([place])    lung ca  . Carotid stenosis, left 09/19/2018  . COPD (chronic obstructive pulmonary disease) ([place])   . Depression   . Dyspnea    with exertion  . GERD (gastroesophageal reflux disease)   . Headache syndrome 03/09/2019  . Hypercholesteremia   . Hypothyroidism   . Memory difficulties 10/28/2015  . Pneumonia     x3  last time 2017  . Pre-diabetes   . Rheumatoid arthritis(714.0)   . Thyroid disease   . Tremor 10/28/2015   Jaw tremor  . Uterine prolapse   . Vitamin D deficiency     Past Surgical History:  Procedure Laterality Date  . ANGIOPLASTY Left 10/10/2018   Procedure: ANGIOPLASTY using 1cm x 14cm Xenosure Patch;  Surgeon: Marty Heck, MD;  Location: [facility];  Service: Vascular;  Laterality: Left;  . APPENDECTOMY    . COLONOSCOPY W/ POLYPECTOMY    . ENDARTERECTOMY Left 10/10/2018   Procedure: ENDARTERECTOMY CAROTID;  Surgeon: Marty Heck, MD;  Location: [facility];  Service: Vascular;  Laterality: Left;  . EYE SURGERY Bilateral    cataract  . FOOT SURGERY Bilateral    arthritis  . Hands  Bilateral     Arthritis  . HEMORRHOID SURGERY    . right lobectomy  10/2004  . TONSILLECTOMY AND ADENOIDECTOMY      Family History  Problem Relation Age of Onset  . Emphysema Mother   . Asthma Mother   . Lung cancer Father   . Pancreatic cancer Father   . Bone cancer Father   . Heart disease Brother        x1  . Heart disease Sister        x2  . Stroke Maternal Grandfather     SOCIAL HISTORY: Social History   Tobacco Use  . Smoking status: Former Smoker    Packs/day: 1.00    Years: 20.00    Pack years: 20.00    Types: Cigarettes    Quit date: 11/10/2004    Years since quitting: 16.0  . Smokeless tobacco: Never Used  Substance Use Topics  . Alcohol use: Yes    Alcohol/week: 1.0 standard drink    Types: 1 Glasses of wine per week    Comment: socially    Allergies  Allergen Reactions  . Codeine     REACTION: hallucinations  . Demerol   . Meperidine Hcl     REACTION: severe GI upset  . Meperidine Hcl     Current Outpatient Medications  Medication Sig Dispense Refill  . acetaminophen (  TYLENOL) 325 MG tablet Take 650 mg by mouth every 6 (six) hours as needed for mild pain.     Marland Kitchen albuterol (PROAIR HFA) 108 (90 Base) MCG/ACT inhaler USE 1-2 PUFFS EVERY 4-6 HOURS AS NEEDED 8.5 g 5  . ALPRAZolam (XANAX) 0.25 MG tablet TAKE 1 TABLET BY MOUTH EVERY DAY AS NEEDED FOR ANXIETY 20 tablet 0  . aspirin EC 81 MG tablet Take 81 mg by mouth daily.    . blood glucose meter kit and supplies KIT Dispense based on patient and insurance preference. Use up to four times daily as directed. (FOR E11.9). 1 each 0  . budesonide-formoterol (SYMBICORT) 160-4.5 MCG/ACT inhaler USE 2 INHALATIONS ORALLY   TWICE DAILY 30.6 g 2  . clidinium-chlordiazePOXIDE (LIBRAX) 5-2.5 MG capsule SMARTSIG:1 Capsule(s) By Mouth Every 12 Hours PRN    . famotidine (PEPCID) 40 MG tablet Take 1 tablet (40 mg total) by mouth daily. 90 tablet 3  . fluconazole (DIFLUCAN) 150 MG tablet Take 1 tablet (150 mg total) by mouth every 3  (three) days. 2 tablet 0  . folic acid (FOLVITE) 1 MG tablet Take 1 mg by mouth daily.    Marland Kitchen gabapentin (NEURONTIN) 100 MG capsule Take 100 mg by mouth 3 (three) times daily.    Marland Kitchen glucose blood (ONETOUCH ULTRA) test strip Use to check blood sugars twice a day 100 each 5  . ibuprofen (ADVIL,MOTRIN) 200 MG tablet Take 200 mg by mouth every 6 (six) hours as needed for headache or mild pain.    Marland Kitchen linaclotide (LINZESS) 72 MCG capsule Take 72 mcg by mouth daily as needed (constipation).     . meclizine (ANTIVERT) 25 MG tablet Take 0.5-1 tablets (12.5-25 mg total) by mouth 3 (three) times daily as needed for dizziness. 30 tablet 5  . methotrexate 50 MG/2ML injection Inject 17.5 mg into the skin every Sunday. Patient is not taking it Sunday, October 13- patient reported  1  . mupirocin ointment (BACTROBAN) 2 % Place 1 application into the nose 2 (two) times daily. 22 g 1  . nitrofurantoin, macrocrystal-monohydrate, (MACROBID) 100 MG capsule Take 1 capsule (100 mg total) by mouth 2 (two) times daily. 14 capsule 0  . NONFORMULARY OR COMPOUNDED ITEM Estradiol .02% 1 ML Prefilled Applicator Sig: apply vaginally twice a week #90 Day Supply with 4 refills 1 each 0  . omeprazole (PRILOSEC) 40 MG capsule Take 40 mg by mouth daily.    [name] DELICA LANCETS 10U MISC USE TO CHECK SUGARS UP TO 4 TIMES A DAY  0  . predniSONE (DELTASONE) 10 MG tablet 4 tabs for 2 days, then 3 tabs for 2 days, 2 tabs for 2 days, then 1 tab for 2 days, then stop 20 tablet 0  . sertraline (ZOLOFT) 100 MG tablet TAKE 1 TABLET BY MOUTH EVERY DAY NEED APPT FOR FUTURE REFILLS 90 tablet 0  . SYNTHROID 88 MCG tablet TAKE 1 TABLET DAILY 90 tablet 1  . traMADol (ULTRAM) 50 MG tablet Take 50 mg by mouth 3 (three) times daily as needed for moderate pain.   0  . triamcinolone ointment (KENALOG) 0.1 % Apply 1 application topically 2 (two) times daily as needed. 30 g 3  . Tiotropium Bromide Monohydrate (SPIRIVA RESPIMAT) 2.5 MCG/ACT AERS Inhale 2  puffs into the lungs daily. (Patient not taking: Reported on 10/31/2020) 12 g 1   No current facility-administered medications for this visit.    REVIEW OF SYSTEMS:  '[X]'  denotes positive finding, '[ ]'  denotes  negative finding Cardiac  Comments:  Chest pain or chest pressure:    Shortness of breath upon exertion:    Short of breath when lying flat:    Irregular heart rhythm:        Vascular    Pain in calf, thigh, or hip brought on by ambulation:    Pain in feet at night that wakes you up from your sleep:     Blood clot in your veins:    Leg swelling:         Pulmonary    Oxygen at home:    Productive cough:     Wheezing:         Neurologic    Sudden weakness in arms or legs:     Sudden numbness in arms or legs:     Sudden onset of difficulty speaking or slurred speech:    Temporary loss of vision in one eye:     Problems with dizziness:         Gastrointestinal    Blood in stool:     Vomited blood:         Genitourinary    Burning when urinating:     Blood in urine:        Psychiatric    Major depression:         Hematologic    Bleeding problems:    Problems with blood clotting too easily:        Skin    Rashes or ulcers:        Constitutional    Fever or chills:      PHYSICAL EXAM: Vitals:   11/19/20 1406 11/19/20 1412  BP: (!) 152/78 132/88  Pulse: 60 60  Resp: 14   Temp: 97.7 F (36.5 C)   TempSrc: Temporal   SpO2: 98%   Weight: 138 lb (62.6 kg)   Height: '5\' 2"'  (1.575 m)     GENERAL: The patient is a well-nourished female, in no acute distress. The vital signs are documented above. CARDIAC: There is a regular rate and rhythm.  VASCULAR:  Left neck incision well healed Neuro: CN II-XII grossly intact Neuro exam otherwise completely intact  DATA:   I reviewed her carotid duplex today and the left carotid endarterectomy site remains patent with velocities in the 1-39% range.  Slight increase in velocities in right ICA now  40-59%.  Assessment/Plan:  86 year old female status post left carotid endarterectomy on 10/10/2018 for high-grade symptomatic stenosis.  Left carotid endarterectomy site continues to look good with velocities in the minimal 1 to 39% range.  She remains on aspirin for overall risk reduction.  She has had a slight increase in velocities in the right ICA now in the 40 to 59% range but discussed no indication for surgical intervention.  Ultimately discussed we will continue surveillance and I will plan to see her again in 1 year.  In the setting of asymptomatic carotid disease we discussed no intervention unless more than 80% stenosis.  Marty Heck, MD [facility] Office: [place]

## 2020-11-26 ENCOUNTER — Telehealth: Payer: Self-pay | Admitting: Internal Medicine

## 2020-11-26 NOTE — Telephone Encounter (Signed)
Patient wondering if she can get a referral to [facility]

## 2020-11-26 NOTE — Telephone Encounter (Signed)
Left message for patient to return call to clinic to let us know why she needed referral so Dr. Quay Burow could complete request.

## 2020-11-28 NOTE — Progress Notes (Signed)
Subjective:    Patient ID: Lisa Vega, female    DOB: June 21, 1932, 86 y.o.   MRN: [id number]  HPI The patient is here for an acute visit.  Rash on face - she saw a dermatologist 6 weeks - he prescribed a sulfa soap and she used it a couple of times and it did not helps.  She used an otc prosacea cream, which is helping.  She would also like to have a prescription possible just in case.   She has RA and goes to Dr Trudie Reed - this is getting too far for her to drive.  She would like to establish with [facility] rheumatology.    She gets anxiety at times.  She takes xanax as needed and rarely takes takes them - often only 1/2.  She does need a refill today.  Medications and allergies reviewed with patient and updated if appropriate.  Patient Active Problem List   Diagnosis Date Noted  . COPD with acute exacerbation ([place]) 09/13/2020  . Stye 06/25/2020  . Conjunctivitis 06/25/2020  . Elevated blood pressure reading 02/02/2020  . RUQ pain 08/01/2019  . Right flank pain 08/01/2019  . Headache syndrome 03/09/2019  . Upper respiratory tract infection 01/23/2019  . Carotid stenosis, left 09/19/2018  . Benign positional vertigo 03/10/2018  . History of lung cancer 11/29/2017  . Difficulty sleeping 09/22/2017  . Diabetes ([place]) 05/13/2017  . Fatigue 05/12/2017  . CAP (community acquired pneumonia) 05/03/2017  . Orthopnea 01/12/2017  . GERD (gastroesophageal reflux disease) 11/09/2016  . Osteoporosis 05/06/2016  . Depression with anxiety 05/06/2016  . Hiatal hernia 05/06/2016  . Hypothyroidism 05/06/2016  . Tremor 10/28/2015  . Memory difficulties 10/28/2015  . Hoarseness 10/14/2015  . History of vitamin D deficiency 05/02/2015  . 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 ([place]) 05/30/2014  . Diverticulitis of colon without hemorrhage 08/07/2013  . Prolapse of female pelvic organs 06/22/2013  . Neck pain 03/10/2013  . Vaginal atrophy 03/09/2013  . Rectocele 03/09/2013  .  Cystocele 03/09/2013  . Urinary incontinence 03/09/2013  . COPD (chronic obstructive pulmonary disease) ([place]) 01/16/2011  . CARCINOMA, LUNG, NONSMALL CELL 12/17/2010  . Rheumatoid arthritis ([place]) 12/17/2010  . Dyspnea on exertion 12/17/2010    Current Outpatient Medications on File Prior to Visit  Medication Sig Dispense Refill  . acetaminophen (TYLENOL) 325 MG tablet Take 650 mg by mouth every 6 (six) hours as needed for mild pain.     Marland Kitchen albuterol (PROAIR HFA) 108 (90 Base) MCG/ACT inhaler USE 1-2 PUFFS EVERY 4-6 HOURS AS NEEDED 8.5 g 5  . ALPRAZolam (XANAX) 0.25 MG tablet TAKE 1 TABLET BY MOUTH EVERY DAY AS NEEDED FOR ANXIETY 20 tablet 0  . aspirin EC 81 MG tablet Take 81 mg by mouth daily.    . B-D TB SYRINGE 1CC/27GX1/2" 27G X 1/2" 1 ML MISC     . blood glucose meter kit and supplies KIT Dispense based on patient and insurance preference. Use up to four times daily as directed. (FOR E11.9). 1 each 0  . budesonide-formoterol (SYMBICORT) 160-4.5 MCG/ACT inhaler USE 2 INHALATIONS ORALLY   TWICE DAILY 30.6 g 2  . clidinium-chlordiazePOXIDE (LIBRAX) 5-2.5 MG capsule SMARTSIG:1 Capsule(s) By Mouth Every 12 Hours PRN    . famotidine (PEPCID) 40 MG tablet Take 1 tablet (40 mg total) by mouth daily. 90 tablet 3  . fluconazole (DIFLUCAN) 150 MG tablet Take 1 tablet (150 mg total) by mouth every 3 (three) days. 2 tablet 0  .  folic acid (FOLVITE) 1 MG tablet Take 1 mg by mouth daily.    Marland Kitchen gabapentin (NEURONTIN) 100 MG capsule Take 100 mg by mouth 3 (three) times daily.    Marland Kitchen glucose blood (ONETOUCH ULTRA) test strip Use to check blood sugars twice a day 100 each 5  . ibuprofen (ADVIL,MOTRIN) 200 MG tablet Take 200 mg by mouth every 6 (six) hours as needed for headache or mild pain.    Marland Kitchen linaclotide (LINZESS) 72 MCG capsule Take 72 mcg by mouth daily as needed (constipation).     . meclizine (ANTIVERT) 25 MG tablet Take 0.5-1 tablets (12.5-25 mg total) by mouth 3 (three) times daily as needed for  dizziness. 30 tablet 5  . methotrexate 50 MG/2ML injection Inject 17.5 mg into the skin every Sunday. Patient is not taking it Sunday, October 13- patient reported  1  . mupirocin ointment (BACTROBAN) 2 % Place 1 application into the nose 2 (two) times daily. 22 g 1  . nitrofurantoin, macrocrystal-monohydrate, (MACROBID) 100 MG capsule Take 1 capsule (100 mg total) by mouth 2 (two) times daily. 14 capsule 0  . NONFORMULARY OR COMPOUNDED ITEM Estradiol .02% 1 ML Prefilled Applicator Sig: apply vaginally twice a week #90 Day Supply with 4 refills 1 each 0  . omeprazole (PRILOSEC) 40 MG capsule Take 40 mg by mouth daily.    [name] DELICA LANCETS 68H MISC USE TO CHECK SUGARS UP TO 4 TIMES A DAY  0  . predniSONE (DELTASONE) 10 MG tablet 4 tabs for 2 days, then 3 tabs for 2 days, 2 tabs for 2 days, then 1 tab for 2 days, then stop 20 tablet 0  . sertraline (ZOLOFT) 100 MG tablet TAKE 1 TABLET BY MOUTH EVERY DAY NEED APPT FOR FUTURE REFILLS 90 tablet 0  . SYNTHROID 88 MCG tablet TAKE 1 TABLET DAILY 90 tablet 1  . Tiotropium Bromide Monohydrate (SPIRIVA RESPIMAT) 2.5 MCG/ACT AERS Inhale 2 puffs into the lungs daily. 12 g 1  . traMADol (ULTRAM) 50 MG tablet Take 50 mg by mouth 3 (three) times daily as needed for moderate pain.   0  . triamcinolone ointment (KENALOG) 0.1 % Apply 1 application topically 2 (two) times daily as needed. 30 g 3   No current facility-administered medications on file prior to visit.    Past Medical History:  Diagnosis Date  . Anxiety   . Arthritis    RA  . BPV (benign positional vertigo) 03/10/2018  . Cancer ([place])    lung ca  . Carotid stenosis, left 09/19/2018  . COPD (chronic obstructive pulmonary disease) ([place])   . Depression   . Dyspnea    with exertion  . GERD (gastroesophageal reflux disease)   . Headache syndrome 03/09/2019  . Hypercholesteremia   . Hypothyroidism   . Memory difficulties 10/28/2015  . Pneumonia     x3  last time 2017  . Pre-diabetes     . Rheumatoid arthritis(714.0)   . Thyroid disease   . Tremor 10/28/2015   Jaw tremor  . Uterine prolapse   . Vitamin D deficiency     Past Surgical History:  Procedure Laterality Date  . ANGIOPLASTY Left 10/10/2018   Procedure: ANGIOPLASTY using 1cm x 14cm Xenosure Patch;  Surgeon: Marty Heck, MD;  Location: [facility];  Service: Vascular;  Laterality: Left;  . APPENDECTOMY    . COLONOSCOPY W/ POLYPECTOMY    . ENDARTERECTOMY Left 10/10/2018   Procedure: ENDARTERECTOMY CAROTID;  Surgeon: Marty Heck, MD;  Location: [facility] OR;  Service: Vascular;  Laterality: Left;  . EYE SURGERY Bilateral    cataract  . FOOT SURGERY Bilateral    arthritis  . Hands  Bilateral    Arthritis  . HEMORRHOID SURGERY    . right lobectomy  10/2004  . TONSILLECTOMY AND ADENOIDECTOMY      Social History   Socioeconomic History  . Marital status: Widowed    Spouse name: Not on file  . Number of children: 3  . Years of education: LPN  . Highest education level: Not on file  Occupational History  . Occupation: retired    Fish farm manager: RETIRED    Comment: LPN at [employer]  Tobacco Use  . Smoking status: Former Smoker    Packs/day: 1.00    Years: 20.00    Pack years: 20.00    Types: Cigarettes    Quit date: 11/10/2004    Years since quitting: 16.0  . Smokeless tobacco: Never Used  Vaping Use  . Vaping Use: Never used  Substance and Sexual Activity  . Alcohol use: Yes    Alcohol/week: 1.0 standard drink    Types: 1 Glasses of wine per week    Comment: socially  . Drug use: No  . Sexual activity: Not Currently    Comment: 1st intercourse- 19, partners- 5,   Other Topics Concern  . Not on file  Social History Narrative   Patient drinks about 3 cups of coffee daily.   Patient is right handed.    Social Determinants of Health   Financial Resource Strain: Low Risk   . Difficulty of Paying Living Expenses: Not very hard  Food Insecurity:   . Worried About Paediatric nurse in the Last Year: Not on file  . Ran Out of Food in the Last Year: Not on file  Transportation Needs:   . Lack of Transportation (Medical): Not on file  . Lack of Transportation (Non-Medical): Not on file  Physical Activity:   . Days of Exercise per Week: Not on file  . Minutes of Exercise per Session: Not on file  Stress:   . Feeling of Stress : Not on file  Social Connections:   . Frequency of Communication with Friends and Family: Not on file  . Frequency of Social Gatherings with Friends and Family: Not on file  . Attends Religious Services: Not on file  . Active Member of Clubs or Organizations: Not on file  . Attends Archivist Meetings: Not on file  . Marital Status: Not on file    Family History  Problem Relation Age of Onset  . Emphysema Mother   . Asthma Mother   . Lung cancer Father   . Pancreatic cancer Father   . Bone cancer Father   . Heart disease Brother        x1  . Heart disease Sister        x2  . Stroke Maternal Grandfather     Review of Systems  Constitutional: Negative for fever.  Respiratory: Positive for shortness of breath. Negative for cough and wheezing.   Cardiovascular: Negative for chest pain, palpitations and leg swelling.  Musculoskeletal: Positive for arthralgias.  Neurological: Positive for dizziness and headaches.       Objective:   Vitals:   11/29/20 1321  BP: 116/60  Pulse: 70  Temp: 98.6 F (37 C)  SpO2: 93%   BP Readings from Last 3 Encounters:  11/29/20 116/60  11/19/20 132/88  10/31/20 (!) 120/56   Wt Readings from Last 3 Encounters:  11/29/20 141 lb (64 kg)  11/19/20 138 lb (62.6 kg)  10/31/20 140 lb 9.6 oz (63.8 kg)   Body mass index is 25.79 kg/m.   Physical Exam         Assessment & Plan:    See Problem List for Assessment and Plan of chronic medical problems.    This visit occurred during the SARS-CoV-2 public health emergency.  Safety protocols were in place, including  screening questions prior to the visit, additional usage of staff PPE, and extensive cleaning of exam room while observing appropriate contact time as indicated for disinfecting solutions.

## 2020-11-29 ENCOUNTER — Other Ambulatory Visit: Payer: Self-pay

## 2020-11-29 ENCOUNTER — Encounter: Payer: Self-pay | Admitting: Internal Medicine

## 2020-11-29 ENCOUNTER — Ambulatory Visit (INDEPENDENT_AMBULATORY_CARE_PROVIDER_SITE_OTHER): Payer: Medicare Other | Admitting: Internal Medicine

## 2020-11-29 VITALS — BP 116/60 | HR 70 | Temp 98.6°F | Ht 62.0 in | Wt 141.0 lb

## 2020-11-29 DIAGNOSIS — L719 Rosacea, unspecified: Secondary | ICD-10-CM | POA: Insufficient documentation

## 2020-11-29 DIAGNOSIS — E038 Other specified hypothyroidism: Secondary | ICD-10-CM | POA: Diagnosis not present

## 2020-11-29 DIAGNOSIS — I6523 Occlusion and stenosis of bilateral carotid arteries: Secondary | ICD-10-CM

## 2020-11-29 DIAGNOSIS — F418 Other specified anxiety disorders: Secondary | ICD-10-CM | POA: Diagnosis not present

## 2020-11-29 DIAGNOSIS — M069 Rheumatoid arthritis, unspecified: Secondary | ICD-10-CM | POA: Diagnosis not present

## 2020-11-29 MED ORDER — SERTRALINE HCL 100 MG PO TABS
ORAL_TABLET | ORAL | 1 refills | Status: DC
Start: 2020-11-29 — End: 2022-01-05

## 2020-11-29 MED ORDER — LEVOTHYROXINE SODIUM 88 MCG PO TABS: 88.0000 ug | ORAL_TABLET | Freq: Every day | ORAL | 1 refills | Status: DC

## 2020-11-29 MED ORDER — METRONIDAZOLE 1 % EX GEL: Freq: Every day | CUTANEOUS | 0 refills | Status: DC

## 2020-11-29 MED ORDER — FLUCONAZOLE 150 MG PO TABS: 150.0000 mg | ORAL_TABLET | Freq: Once | ORAL | 0 refills | Status: AC

## 2020-11-29 MED ORDER — ALPRAZOLAM 0.25 MG PO TABS
ORAL_TABLET | ORAL | 0 refills | Status: DC
Start: 2020-11-29 — End: 2021-04-02

## 2020-11-29 NOTE — Assessment & Plan Note (Signed)
Chronic Continue levothyroxine 88 mcg daily

## 2020-11-29 NOTE — Assessment & Plan Note (Signed)
Chronic Overall depression and anxiety are controlled Continue sertraline 100 mg daily Continue alprazolam 0.25 mg daily as needed-refilled today

## 2020-11-29 NOTE — Patient Instructions (Addendum)
   Medications changes include :   Diflucan pill for your yeast infection.  Try metrogel for your rash.     Your prescription(s) have been submitted to your pharmacy. Please take as directed and contact our office if you believe you are having problem(s) with the medication(s).     Please followup in 6 months

## 2020-11-29 NOTE — Assessment & Plan Note (Signed)
Chronic Currently on methotrexate Would like to establish with: Rheumatology due to location-referral ordered

## 2020-11-29 NOTE — Assessment & Plan Note (Signed)
New problem She is taking over-the-counter prosacea cream, which helps We will prescribe MetroGel 1%

## 2020-12-05 NOTE — Progress Notes (Signed)
Office Visit Note  Patient: Lisa Vega             Date of Birth: 20-Mar-1932           MRN: [id number]             PCP: Binnie Rail, MD Referring: Binnie Rail, MD Visit Date: 12/06/2020   Subjective:  Pain and Edema of the Left Knee (Recent injury, patient twisted her knee X 2 days), Pain of the Neck, Pain of the Left Shoulder, Pain of the Right Shoulder, and New Patient (Initial Visit) (Transfer of care)   History of Present Illness: Lisa Vega is a 86 y.o. female with a history of non-small cell lung cancer status post surgical resection and chemotherapy, hypothyroidism, osteoporosis, and COPD here to establish care for rheumatoid arthritis currently treated with methotrexate subcutaneous 17.5 mg weekly.  She was originally diagnosed around 40 years ago in [place] she to treatment with gold injections that were not effective.  She later treated with oral methotrexate that was poorly tolerated with rashes at even low-dose.  She was switched to subcutaneous methotrexate which she has been taking for at least 20 years.  Since moving to this area she is seeing rheumatology care with Dr. Justine Null, Dr. Rockwell [place], Dr. Trudie Reed, and now seeks to transfer care for an office that is closer to her daughter's home where she has started living due to increased difficulties with independent living and commuting.  She has extensive long-term damage from her rheumatoid arthritis with subluxation deviation and deformity of the digits on both hands and both feet.  She is also had some involvement of the shoulders and wrists but not as severe.  She has degenerative back disease, cervical disc disease, and bilateral osteoarthritis of the knees.  The trouble with her knees especially with ulcer and deformity of the right foot significantly limit her mobility at this time.  She has not noticed a lot of joint swelling redness or heat recently and has not required recent steroid treatments.  She has had some  benefit with intra-articular injections for the knees also had some injections for the neck and upper back with mixed results.  She also describes some nodules in the skin of her upper arms and on her hands that she thinks are more noticeable over the past several months.  She has a history of non-small cell lung cancer that was found with a rapidly enlarging left lung nodule.  She was a former cigarette smoker with COPD but no longer.  She denies a known history of interstitial lung disease but has discussed difficulty say whether methotrexate has contributed a lung toxicity versus just her RA.  Activities of Daily Living:  Patient reports morning stiffness for 1-2 hour.   Patient Denies nocturnal pain.  Difficulty dressing/grooming: Denies Difficulty climbing stairs: Reports Difficulty getting out of chair: Denies Difficulty using hands for taps, buttons, cutlery, and/or writing: Reports  Review of Systems  Constitutional: Positive for fatigue.  HENT: Positive for mouth dryness and nose dryness. Negative for mouth sores.   Eyes: Positive for pain, itching, visual disturbance and dryness.  Respiratory: Positive for cough, shortness of breath and difficulty breathing. Negative for hemoptysis.   Cardiovascular: Positive for chest pain. Negative for palpitations and swelling in legs/feet.  Gastrointestinal: Positive for abdominal pain, constipation and diarrhea. Negative for blood in stool.  Endocrine: Negative for increased urination.  Genitourinary: Negative for painful urination.  Musculoskeletal: Positive for arthralgias, joint pain,  joint swelling, myalgias, morning stiffness and myalgias. Negative for muscle weakness and muscle tenderness.  Skin: Positive for rash and redness. Negative for color change.  Allergic/Immunologic: Negative for susceptible to infections.  Neurological: Positive for dizziness, headaches and memory loss. Negative for numbness.  Hematological: Positive for  swollen glands.  Psychiatric/Behavioral: Positive for sleep disturbance. Negative for confusion.    PMFS History:  Patient Active Problem List   Diagnosis Date Noted  . High risk medication use 12/06/2020  . Rosacea 11/29/2020  . RUQ pain 08/01/2019  . Headache syndrome 03/09/2019  . Carotid stenosis, left 09/19/2018  . Benign positional vertigo 03/10/2018  . History of lung cancer 11/29/2017  . Difficulty sleeping 09/22/2017  . Diabetes ([place]) 05/13/2017  . Fatigue 05/12/2017  . CAP (community acquired pneumonia) 05/03/2017  . Orthopnea 01/12/2017  . GERD (gastroesophageal reflux disease) 11/09/2016  . Osteoporosis 05/06/2016  . Depression with anxiety 05/06/2016  . Hiatal hernia 05/06/2016  . Hypothyroidism 05/06/2016  . Tremor 10/28/2015  . Memory difficulties 10/28/2015  . Hoarseness 10/14/2015  . History of vitamin D deficiency 05/02/2015  . 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 ([place]) 05/30/2014  . Diverticulitis of colon without hemorrhage 08/07/2013  . Prolapse of female pelvic organs 06/22/2013  . Neck pain 03/10/2013  . Vaginal atrophy 03/09/2013  . Rectocele 03/09/2013  . Cystocele 03/09/2013  . Urinary incontinence 03/09/2013  . COPD (chronic obstructive pulmonary disease) ([place]) 01/16/2011  . CARCINOMA, LUNG, NONSMALL CELL 12/17/2010  . Rheumatoid arthritis ([place]) 12/17/2010  . Dyspnea on exertion 12/17/2010    Past Medical History:  Diagnosis Date  . Anxiety   . Arthritis    RA  . BPV (benign positional vertigo) 03/10/2018  . Cancer ([place])    lung ca  . Carotid stenosis, left 09/19/2018  . COPD (chronic obstructive pulmonary disease) ([place])   . Depression   . Dyspnea    with exertion  . GERD (gastroesophageal reflux disease)   . Headache syndrome 03/09/2019  . Hypercholesteremia   . Hypothyroidism   . Memory difficulties 10/28/2015  . Pneumonia     x3  last time 2017  . Pre-diabetes   . Rheumatoid arthritis(714.0)   . Thyroid disease   .  Tremor 10/28/2015   Jaw tremor  . Uterine prolapse   . Vitamin D deficiency     Family History  Problem Relation Age of Onset  . Emphysema Mother   . Asthma Mother   . Lung cancer Father   . Pancreatic cancer Father   . Bone cancer Father   . Heart disease Brother        x1  . Diabetes Brother   . Hypertension Brother   . Lupus Brother   . Heart disease Sister        x2  . Diabetes Sister   . Hypertension Sister   . Diabetes Sister   . Hypertension Sister   . Diabetes Brother   . Hypertension Brother   . Stroke Maternal Grandfather   . Hypertension Son   . Tremor Son    Past Surgical History:  Procedure Laterality Date  . ANGIOPLASTY Left 10/10/2018   Procedure: ANGIOPLASTY using 1cm x 14cm Xenosure Patch;  Surgeon: Marty Heck, MD;  Location: [facility];  Service: Vascular;  Laterality: Left;  . APPENDECTOMY    . COLONOSCOPY W/ POLYPECTOMY    . ENDARTERECTOMY Left 10/10/2018   Procedure: ENDARTERECTOMY CAROTID;  Surgeon: Marty Heck, MD;  Location: [facility];  Service: Vascular;  Laterality: Left;  . EYE SURGERY Bilateral    cataract  . FOOT SURGERY Bilateral    arthritis  . Hands  Bilateral    Arthritis  . HEMORRHOID SURGERY    . right lobectomy  10/2004  . TONSILLECTOMY AND ADENOIDECTOMY     Social History   Social History Narrative   Patient drinks about 3 cups of coffee daily.   Patient is right handed.    Immunization History  Administered Date(s) Administered  . Fluad Quad(high Dose 65+) 12/08/2019, 10/31/2020  . Influenza Split 09/28/2011, 09/28/2015  . Influenza Whole 09/27/2012  . Influenza, High Dose Seasonal PF 10/28/2016, 01/12/2017, 09/22/2017, 10/03/2018  . Influenza,inj,Quad PF,6+ Mos 09/20/2013, 09/12/2014  . Pneumococcal Conjugate-13 10/24/2015, 10/31/2020  . Pneumococcal Polysaccharide-23 12/29/2007     Objective: Vital Signs: BP 122/75 (BP Location: Right Arm, Patient Position: Sitting, Cuff Size: Small)   Pulse 69   Ht  5\' 2"  (1.575 m)   Wt 140 lb (63.5 kg)   BMI 25.61 kg/m    Physical Exam HENT:     Right Ear: External ear normal.     Left Ear: External ear normal.     Mouth/Throat:     Mouth: Mucous membranes are moist.     Pharynx: Oropharynx is clear.  Eyes:     Conjunctiva/sclera: Conjunctivae normal.  Cardiovascular:     Rate and Rhythm: Normal rate and regular rhythm.  Pulmonary:     Effort: Pulmonary effort is normal.     Breath sounds: Normal breath sounds.  Skin:    General: Skin is warm and dry.     Findings: No rash.  Neurological:     General: No focal deficit present.     Mental Status: She is alert.  Psychiatric:        Mood and Affect: Mood normal.     Musculoskeletal Exam: Bilateral neck paraspinal muscle tenderness to palpation also with flexion and lateral rotation Shoulder pain is provoked with overhead abduction bilaterally with tenderness to the lateral aspect of the shoulders no swelling seen, sits with rolled forward shoulder posture Elbow and wrist near normal range of motion Extensive bony enlargement with subluxation of MCP joints on bilateral hands with nonreversible deformity and swan-neck No lateral hip tenderness Bilateral patellofemoral crepitus with near normal knee range of motion Normal ankle range of motion MTP joints severely damaged with cocked up position severe lateral deviation of first digit and ulcer on the right foot on plantar surface under MTPs    CDAI Exam: CDAI Score: 3.7  Patient Global: 5 mm; Provider Global: 2 mm Swollen: 0 ; Tender: 3  Joint Exam 12/06/2020      Right  Left  Glenohumeral   Tender   Tender  MCP 2   Tender        Investigation: No additional findings.  Imaging: VAS US CAROTID  Result Date: 11/19/2020 Carotid Arterial Duplex Study Indications:       Carotid artery disease and left endarterectomy. Comparison Study:  09/19/19: Right 1-39% ICA stenosis. Left: patent CEA with no                    stenosis.  Performing Technologist: Ralene Cork RVT  Examination Guidelines: A complete evaluation includes B-mode imaging, spectral Doppler, color Doppler, and power Doppler as needed of all accessible portions of each vessel. Bilateral testing is considered an integral part of a complete examination. Limited examinations for reoccurring indications may be performed as noted.  Right Carotid  Findings: +----------+--------+--------+--------+-------------------------+--------+           PSV cm/sEDV cm/sStenosisPlaque Description       Comments +----------+--------+--------+--------+-------------------------+--------+ CCA Prox  51      13                                                +----------+--------+--------+--------+-------------------------+--------+ CCA Mid   51      13              heterogenous                      +----------+--------+--------+--------+-------------------------+--------+ CCA Distal53      14              heterogenous                      +----------+--------+--------+--------+-------------------------+--------+ ICA Prox  141     41      40-59%  heterogenous and calcific         +----------+--------+--------+--------+-------------------------+--------+ ICA Mid   133     27                                                +----------+--------+--------+--------+-------------------------+--------+ ICA Distal77      21                                                +----------+--------+--------+--------+-------------------------+--------+ ECA       135     19              heterogenous and calcific         +----------+--------+--------+--------+-------------------------+--------+ +----------+--------+-------+----------------+-------------------+           PSV cm/sEDV cmsDescribe        Arm Pressure (mmHG) +----------+--------+-------+----------------+-------------------+ [id number]            Multiphasic, WNL                     +----------+--------+-------+----------------+-------------------+ +---------+--------+--+--------+-+---------+ VertebralPSV cm/s39EDV cm/s8Antegrade +---------+--------+--+--------+-+---------+  Left Carotid Findings: +----------+--------+--------+--------+------------------+--------+           PSV cm/sEDV cm/sStenosisPlaque DescriptionComments +----------+--------+--------+--------+------------------+--------+ CCA Prox  61      13                                         +----------+--------+--------+--------+------------------+--------+ CCA Mid   50      10                                         +----------+--------+--------+--------+------------------+--------+ CCA Distal49      11              heterogenous               +----------+--------+--------+--------+------------------+--------+ ICA Prox  51      14      1-39%   heterogenous               +----------+--------+--------+--------+------------------+--------+  ICA Mid   92      23                                         +----------+--------+--------+--------+------------------+--------+ ICA Distal67      20                                         +----------+--------+--------+--------+------------------+--------+ ECA       124     17                                         +----------+--------+--------+--------+------------------+--------+ +----------+--------+--------+----------------+-------------------+           PSV cm/sEDV cm/sDescribe        Arm Pressure (mmHG) +----------+--------+--------+----------------+-------------------+ [id number]             Multiphasic, WNL                    +----------+--------+--------+----------------+-------------------+ +---------+--------+--+--------+-+---------+ VertebralPSV cm/s35EDV cm/s9Antegrade +---------+--------+--+--------+-+---------+   Summary: Right Carotid: Velocities in the right ICA are consistent with a 40-59%                 stenosis. Degree of stenosis is on the borderline with 1-39%.                Previous exams have put it at 1-39%, others at 40-59% depending                on the end diastolic velocity. Left Carotid: Velocities in the left ICA are consistent with a 1-39% stenosis. Vertebrals:  Bilateral vertebral arteries demonstrate antegrade flow. Subclavians: Normal flow hemodynamics were seen in bilateral subclavian              arteries. *See table(s) above for measurements and observations.  Electronically signed by Monica Martinez MD on 11/19/2020 at 1:33:08 PM.    Final     Recent Labs: Lab Results  Component Value Date   WBC 5.3 08/01/2019   HGB 12.4 08/01/2019   PLT 254.0 08/01/2019   NA 137 02/02/2020   K 4.2 02/02/2020   CL 101 02/02/2020   CO2 31 02/02/2020   GLUCOSE 92 02/02/2020   BUN 10 02/02/2020   CREATININE 0.97 02/02/2020   BILITOT 0.6 02/02/2020   ALKPHOS 86 02/02/2020   AST 20 02/02/2020   ALT 11 02/02/2020   PROT 7.4 02/02/2020   ALBUMIN 4.0 02/02/2020   CALCIUM 9.4 02/02/2020   GFRAA >60 10/11/2018    Speciality Comments: No specialty comments available.  Procedures:  No procedures performed Allergies: Codeine, Demerol, Meperidine hcl, and Meperidine hcl   Assessment / Plan:     Visit Diagnoses: Rheumatoid arthritis involving multiple sites, unspecified whether rheumatoid factor present ([place]) - Plan: Sedimentation rate, C-reactive protein  She has fairly diffuse joint pain but seems to be more muscular and from residual degenerative arthritis with longstanding RA.  No active synovitis seen on exam today.  We will check sedimentation rate and CRP but appears to be controlled.  Discussed stretching and range of motion exercises for the shoulder and neck pain that seem noninflammatory.  Plan to continue methotrexate 17.5 mg subcutaneous weekly and folic acid 1 mg  daily.  Age-related osteoporosis without current pathological fracture - Plan: VITAMIN D 25 Hydroxy (Vit-D  Deficiency, Fractures)  History of osteoporosis but from my review I see DXA scan from 2017 showing osteopenia with left femoral neck t score -2.4. Risk is high based on FRAX score. She has documented past intolerance of fosamax from GI symptoms. Does not seem highly interested in starting more medications unless they will affect joint pain or function. Currently taking vitamin D we will check this for now.  High risk medication use - Plan: CBC with Differential/Platelet, COMPLETE METABOLIC PANEL WITH GFR  We will check lab monitoring for long-term use of methotrexate CBC and CMP.  If normal will continue on the current dose follow-up for monitoring 3 months.  Discussed cutaneous nodules and history of pulmonary nodules if these seem to further progress in upcoming months may have to reconsider methotrexate as her best maintenance therapy.  Orders: Orders Placed This Encounter  Procedures  . CBC with Differential/Platelet  . COMPLETE METABOLIC PANEL WITH GFR  . VITAMIN D 25 Hydroxy (Vit-D Deficiency, Fractures)  . Sedimentation rate  . C-reactive protein   No orders of the defined types were placed in this encounter.   Follow-Up Instructions: Return in about 3 months (around 03/06/2021) for RA f/u on MTX.   Collier Salina, MD  Note - This record has been created using [employer].  Chart creation errors have been sought, but may not always  have been located. Such creation errors do not reflect on  the standard of medical care.

## 2020-12-06 ENCOUNTER — Encounter: Payer: Self-pay | Admitting: Internal Medicine

## 2020-12-06 ENCOUNTER — Other Ambulatory Visit: Payer: Self-pay

## 2020-12-06 ENCOUNTER — Encounter (INDEPENDENT_AMBULATORY_CARE_PROVIDER_SITE_OTHER): Payer: Self-pay

## 2020-12-06 ENCOUNTER — Ambulatory Visit (INDEPENDENT_AMBULATORY_CARE_PROVIDER_SITE_OTHER): Payer: Medicare Other | Admitting: Internal Medicine

## 2020-12-06 VITALS — BP 122/75 | HR 69 | Ht 62.0 in | Wt 140.0 lb

## 2020-12-06 DIAGNOSIS — M069 Rheumatoid arthritis, unspecified: Secondary | ICD-10-CM

## 2020-12-06 DIAGNOSIS — Z79899 Other long term (current) drug therapy: Secondary | ICD-10-CM | POA: Diagnosis not present

## 2020-12-06 DIAGNOSIS — M810 Age-related osteoporosis without current pathological fracture: Secondary | ICD-10-CM | POA: Diagnosis not present

## 2020-12-06 NOTE — Patient Instructions (Signed)
Helpful Exercises for neck and shoulder pain:

## 2020-12-07 LAB — CBC WITH DIFFERENTIAL/PLATELET
Absolute Monocytes: 442 cells/uL (ref 200–950)
Basophils Absolute: 62 cells/uL (ref 0–200)
Basophils Relative: 1.3 %
Eosinophils Absolute: 82 cells/uL (ref 15–500)
Eosinophils Relative: 1.7 %
HCT: 40.3 % (ref 35.0–45.0)
Hemoglobin: 13.3 g/dL (ref 11.7–15.5)
Lymphs Abs: 2635 cells/uL (ref 850–3900)
MCH: 31.5 pg (ref 27.0–33.0)
MCHC: 33 g/dL (ref 32.0–36.0)
MCV: 95.5 fL (ref 80.0–100.0)
MPV: 10.9 fL (ref 7.5–12.5)
Monocytes Relative: 9.2 %
Neutro Abs: 1579 cells/uL (ref 1500–7800)
Neutrophils Relative %: 32.9 %
Platelets: 177 10*3/uL (ref 140–400)
RBC: 4.22 10*6/uL (ref 3.80–5.10)
RDW: 13.3 % (ref 11.0–15.0)
Total Lymphocyte: 54.9 %
WBC: 4.8 10*3/uL (ref 3.8–10.8)

## 2020-12-07 LAB — COMPLETE METABOLIC PANEL WITH GFR
AG Ratio: 1.2 (calc) (ref 1.0–2.5)
ALT: 11 U/L (ref 6–29)
AST: 24 U/L (ref 10–35)
Albumin: 3.7 g/dL (ref 3.6–5.1)
Alkaline phosphatase (APISO): 79 U/L (ref 37–153)
BUN: 11 mg/dL (ref 7–25)
CO2: 28 mmol/L (ref 20–32)
Calcium: 9 mg/dL (ref 8.6–10.4)
Chloride: 103 mmol/L (ref 98–110)
Creat: 0.87 mg/dL (ref 0.60–0.88)
GFR, Est African American: 70 mL/min/{1.73_m2} (ref >=60)
GFR, Est Non African American: 60 mL/min/{1.73_m2} (ref >=60)
Globulin: 3 g/dL (calc) (ref 1.9–3.7)
Glucose, Bld: 94 mg/dL (ref 65–99)
Potassium: 4 mmol/L (ref 3.5–5.3)
Sodium: 139 mmol/L (ref 135–146)
Total Bilirubin: 0.8 mg/dL (ref 0.2–1.2)
Total Protein: 6.7 g/dL (ref 6.1–8.1)

## 2020-12-07 LAB — VITAMIN D 25 HYDROXY (VIT D DEFICIENCY, FRACTURES): Vit D, 25-Hydroxy: 29 ng/mL — ABNORMAL LOW (ref 30–100)

## 2020-12-07 LAB — C-REACTIVE PROTEIN: CRP: 15.9 mg/L — ABNORMAL HIGH (ref <8.0)

## 2020-12-07 LAB — SEDIMENTATION RATE: Sed Rate: 31 mm/h — ABNORMAL HIGH (ref 0–30)

## 2020-12-07 MED ORDER — METHOTREXATE SODIUM CHEMO INJECTION 50 MG/2ML: 17.5000 mg | INTRAMUSCULAR | 2 refills | Status: DC

## 2020-12-07 MED ORDER — "BD TB SYRINGE 27G X 1/2"" 1 ML MISC": 1 refills | Status: DC

## 2020-12-07 MED ORDER — FOLIC ACID 1 MG PO TABS: 1.0000 mg | ORAL_TABLET | Freq: Every day | ORAL | 1 refills | Status: DC

## 2020-12-07 NOTE — Addendum Note (Signed)
Addended by: Collier Salina on: 12/07/2020 12:27 PM   Modules accepted: Orders

## 2020-12-07 NOTE — Progress Notes (Signed)
Lab results show normal blood count and liver and kidney function can continue treatment with methotrexate and will send new Rx. Her vitamin D level is 29 very slightly below normal level, she reported taking some supplement make sure it is at least 1000 units or 25 mcg daily, and if it already is then needs to go up to 2000 units. Her sedimentation rate is normal but CPR is increased, a mixed picture whether there is inflammation I think we can continue the current treatment for now and follow up but she can call if hs a flare up of problems in the interval.

## 2020-12-23 ENCOUNTER — Encounter: Payer: Self-pay | Admitting: Nurse Practitioner

## 2020-12-23 ENCOUNTER — Other Ambulatory Visit: Payer: Self-pay

## 2020-12-23 ENCOUNTER — Ambulatory Visit (INDEPENDENT_AMBULATORY_CARE_PROVIDER_SITE_OTHER): Payer: Medicare Other | Admitting: Nurse Practitioner

## 2020-12-23 VITALS — BP 122/78

## 2020-12-23 DIAGNOSIS — R300 Dysuria: Secondary | ICD-10-CM | POA: Diagnosis not present

## 2020-12-23 DIAGNOSIS — B373 Candidiasis of vulva and vagina: Secondary | ICD-10-CM | POA: Diagnosis not present

## 2020-12-23 DIAGNOSIS — Z4689 Encounter for fitting and adjustment of other specified devices: Secondary | ICD-10-CM

## 2020-12-23 DIAGNOSIS — N3000 Acute cystitis without hematuria: Secondary | ICD-10-CM | POA: Diagnosis not present

## 2020-12-23 DIAGNOSIS — N898 Other specified noninflammatory disorders of vagina: Secondary | ICD-10-CM | POA: Diagnosis not present

## 2020-12-23 DIAGNOSIS — B3731 Acute candidiasis of vulva and vagina: Secondary | ICD-10-CM

## 2020-12-23 LAB — WET PREP FOR TRICH, YEAST, CLUE

## 2020-12-23 MED ORDER — NITROFURANTOIN MONOHYD MACRO 100 MG PO CAPS: 100.0000 mg | ORAL_CAPSULE | Freq: Two times a day (BID) | ORAL | 0 refills | Status: DC

## 2020-12-23 MED ORDER — FLUCONAZOLE 150 MG PO TABS: 150.0000 mg | ORAL_TABLET | ORAL | 0 refills | Status: DC

## 2020-12-23 NOTE — Progress Notes (Signed)
   Acute Office Visit  Subjective:    Patient ID: Lisa Vega, female    DOB: 1932/07/31, 86 y.o.   MRN: [id number]   HPI 86 y.o. presents today for dysuria, frequency, urgency, vaginal discharge and dizziness that started 4 days ago. She would like to have her pessary removed until her next appointment in January. She has a history of vertigo but says this is different. Lab work done 12/06/2020 with rheumatology.   Review of Systems  Constitutional: Negative.   Gastrointestinal: Negative.   Genitourinary: Positive for dysuria, frequency, urgency and vaginal discharge.  Neurological: Positive for dizziness.       Objective:    Physical Exam Constitutional:      Appearance: Normal appearance.  Genitourinary:    General: Normal vulva.     Vagina: Vaginal discharge present.     Comments: Vaginal atrophy/uterine prolapse Pessary removed and cleaned    BP 122/78 (BP Location: Right Arm, Patient Position: Sitting, Cuff Size: Normal)  Wt Readings from Last 3 Encounters:  12/06/20 140 lb (63.5 kg)  11/29/20 141 lb (64 kg)  11/19/20 138 lb (62.6 kg)   Wet prep negative  UA 3+ leukocytes, negative nitrite, wbc 10-20, rbc 0-2, moderate bacteria     Assessment & Plan:   Problem List Items Addressed This Visit   None   Visit Diagnoses    Acute cystitis without hematuria    -  Primary   Relevant Medications   nitrofurantoin, macrocrystal-monohydrate, (MACROBID) 100 MG capsule   Dysuria       Relevant Orders   Urinalysis,Complete w/RFL Culture (Completed)   Vaginal discharge       Relevant Orders   WET PREP FOR [place], YEAST, CLUE (Completed)   Pessary maintenance       Vaginal candidiasis       Relevant Medications   nitrofurantoin, macrocrystal-monohydrate, (MACROBID) 100 MG capsule   fluconazole (DIFLUCAN) 150 MG tablet     Plan: UA suggestive of UTI, will treat with Macrobid 100 mg twice daily for 7 days. Culture pending, will triage based on results. She  reports history of vaginal yeast infections with antibiotic use so I have sent in Diflucan if needed. Wet prep unremarkable. Pessary removed and cleaned. She returns in January for pessary maintenance/fitting. If symptoms worsen or do not improve she will return to office. Daughter present while discussing plan of care.      [place], 9:05 PM 12/23/2020

## 2020-12-24 LAB — URINALYSIS, COMPLETE W/RFL CULTURE
Bilirubin Urine: NEGATIVE
Glucose, UA: NEGATIVE
Hyaline Cast: NONE SEEN /LPF
Nitrites, Initial: NEGATIVE
Protein, ur: NEGATIVE
Specific Gravity, Urine: 1.01 (ref 1.001–1.03)
pH: 5.5 (ref 5.0–8.0)

## 2020-12-24 LAB — URINE CULTURE
MICRO NUMBER:: 11358108
Result:: NO GROWTH
SPECIMEN QUALITY:: ADEQUATE

## 2020-12-24 LAB — CULTURE INDICATED

## 2020-12-27 ENCOUNTER — Other Ambulatory Visit: Payer: Self-pay | Admitting: Pulmonary Disease

## 2020-12-30 ENCOUNTER — Ambulatory Visit: Payer: Medicare Other | Admitting: Family

## 2021-01-02 NOTE — Progress Notes (Signed)
Subjective:    Patient ID: Lisa Vega, female    DOB: Feb 22, 1932, 87 y.o.   MRN: [id number]  HPI The patient is here for an acute visit.   She thinks she has the inner ear imblance first.  When she walks she staggers.  She has burning on the top of her head - in three different locations- not at thte same time.  She also has the burning in the back of her head.  She feels times of dizziness and weakness and can not drive at that time.  The dizziness is related to head movements.  She has taken the meclzine and it helps, but temporarily.  She has done physical therapy in the past and was told that she had crystals in her ears.  She denies cold symptoms.    She has soreness on the left side of her head and neck.  Sometimes when she turns her head to the left she feels a pulling in the left neck.    None of the symptoms are new and she does follow with neurology for them.  Her symptoms are worse currently.  They do not flares.  Reviewed neurology's last office note regarding both of these problems.  Medications and allergies reviewed with patient and updated if appropriate.  Patient Active Problem List   Diagnosis Date Noted  . Headache, cervicogenic 01/03/2021  . High risk medication use 12/06/2020  . Rosacea 11/29/2020  . RUQ pain 08/01/2019  . Headache syndrome 03/09/2019  . Carotid stenosis, left 09/19/2018  . Benign positional vertigo 03/10/2018  . History of lung cancer 11/29/2017  . Difficulty sleeping 09/22/2017  . Diabetes ([place]) 05/13/2017  . Fatigue 05/12/2017  . CAP (community acquired pneumonia) 05/03/2017  . Orthopnea 01/12/2017  . GERD (gastroesophageal reflux disease) 11/09/2016  . Osteoporosis 05/06/2016  . Depression with anxiety 05/06/2016  . Hiatal hernia 05/06/2016  . Hypothyroidism 05/06/2016  . Tremor 10/28/2015  . Memory difficulties 10/28/2015  . Hoarseness 10/14/2015  . History of vitamin D deficiency 05/02/2015  . Hyperparathyroidism due to  vitamin D deficiency ([place]) 05/30/2014  . Diverticulitis of colon without hemorrhage 08/07/2013  . Prolapse of female pelvic organs 06/22/2013  . Neck pain 03/10/2013  . Vaginal atrophy 03/09/2013  . Rectocele 03/09/2013  . Cystocele 03/09/2013  . Urinary incontinence 03/09/2013  . COPD (chronic obstructive pulmonary disease) ([place]) 01/16/2011  . CARCINOMA, LUNG, NONSMALL CELL 12/17/2010  . Rheumatoid arthritis ([place]) 12/17/2010  . Dyspnea on exertion 12/17/2010    Current Outpatient Medications on File Prior to Visit  Medication Sig Dispense Refill  . acetaminophen (TYLENOL) 325 MG tablet Take 650 mg by mouth every 6 (six) hours as needed for mild pain.     Marland Kitchen albuterol (PROAIR HFA) 108 (90 Base) MCG/ACT inhaler USE 1-2 PUFFS EVERY 4-6 HOURS AS NEEDED 8.5 g 5  . ALPRAZolam (XANAX) 0.25 MG tablet TAKE 1 TABLET BY MOUTH EVERY DAY AS NEEDED FOR ANXIETY 20 tablet 0  . aspirin EC 81 MG tablet Take 81 mg by mouth daily.    . B-D TB SYRINGE 1CC/27GX1/2" 27G X 1/2" 1 ML MISC For use with methotrexate injection weekly 25 each 1  . blood glucose meter kit and supplies KIT Dispense based on patient and insurance preference. Use up to four times daily as directed. (FOR E11.9). 1 each 0  . budesonide-formoterol (SYMBICORT) 160-4.5 MCG/ACT inhaler USE 2 INHALATIONS ORALLY   TWICE DAILY 30.6 g 2  . clidinium-chlordiazePOXIDE (LIBRAX) 5-2.5 MG capsule  SMARTSIG:1 Capsule(s) By Mouth Every 12 Hours PRN    . famotidine (PEPCID) 40 MG tablet Take 1 tablet (40 mg total) by mouth daily. (Patient taking differently: Take 40 mg by mouth as needed.) 90 tablet 3  . fluconazole (DIFLUCAN) 150 MG tablet Take 1 tablet (150 mg total) by mouth every 3 (three) days. 2 tablet 0  . folic acid (FOLVITE) 1 MG tablet Take 1 tablet (1 mg total) by mouth daily. 90 tablet 1  . gabapentin (NEURONTIN) 100 MG capsule Take 100 mg by mouth 2 (two) times daily.    Marland Kitchen glucose blood (ONETOUCH ULTRA) test strip Use to check blood sugars  twice a day 100 each 5  . ibuprofen (ADVIL,MOTRIN) 200 MG tablet Take 200 mg by mouth every 6 (six) hours as needed for headache or mild pain.    Marland Kitchen levothyroxine (SYNTHROID) 88 MCG tablet Take 1 tablet (88 mcg total) by mouth daily. 90 tablet 1  . linaclotide (LINZESS) 72 MCG capsule Take 72 mcg by mouth daily as needed (constipation).     . methotrexate 50 MG/2ML injection Inject 0.7 mLs (17.5 mg total) into the skin once a week. 4 mL 2  . metroNIDAZOLE (METROGEL) 1 % gel Apply topically daily. 45 g 0  . mupirocin ointment (BACTROBAN) 2 % Place 1 application into the nose 2 (two) times daily. 22 g 1  . nitrofurantoin, macrocrystal-monohydrate, (MACROBID) 100 MG capsule Take 1 capsule (100 mg total) by mouth 2 (two) times daily. 14 capsule 0  . omeprazole (PRILOSEC) 40 MG capsule Take 40 mg by mouth daily.    [name] DELICA LANCETS 10C MISC USE TO CHECK SUGARS UP TO 4 TIMES A DAY  0  . Plecanatide (TRULANCE) 3 MG TABS as needed.    . sertraline (ZOLOFT) 100 MG tablet TAKE 1 TABLET BY MOUTH EVERY DAY 90 tablet 1  . Tiotropium Bromide Monohydrate (SPIRIVA RESPIMAT) 2.5 MCG/ACT AERS Inhale 2 puffs into the lungs daily. 12 g 1  . traMADol (ULTRAM) 50 MG tablet Take 50 mg by mouth 3 (three) times daily as needed for moderate pain.   0  . triamcinolone ointment (KENALOG) 0.1 % Apply 1 application topically 2 (two) times daily as needed. 30 g 3  . Vitamin D, Cholecalciferol, 10 MCG (400 UNIT) CHEW Chew by mouth daily. Unsure of current dose     No current facility-administered medications on file prior to visit.    Past Medical History:  Diagnosis Date  . Anxiety   . Arthritis    RA  . BPV (benign positional vertigo) 03/10/2018  . Cancer ([place])    lung ca  . Carotid stenosis, left 09/19/2018  . COPD (chronic obstructive pulmonary disease) ([place])   . Depression   . Dyspnea    with exertion  . GERD (gastroesophageal reflux disease)   . Headache syndrome 03/09/2019  . Hypercholesteremia   .  Hypothyroidism   . Memory difficulties 10/28/2015  . Pneumonia     x3  last time 2017  . Pre-diabetes   . Rheumatoid arthritis(714.0)   . Thyroid disease   . Tremor 10/28/2015   Jaw tremor  . Uterine prolapse   . Vitamin D deficiency     Past Surgical History:  Procedure Laterality Date  . ANGIOPLASTY Left 10/10/2018   Procedure: ANGIOPLASTY using 1cm x 14cm Xenosure Patch;  Surgeon: Marty Heck, MD;  Location: [facility];  Service: Vascular;  Laterality: Left;  . APPENDECTOMY    . COLONOSCOPY W/  POLYPECTOMY    . ENDARTERECTOMY Left 10/10/2018   Procedure: ENDARTERECTOMY CAROTID;  Surgeon: Marty Heck, MD;  Location: [facility];  Service: Vascular;  Laterality: Left;  . EYE SURGERY Bilateral    cataract  . FOOT SURGERY Bilateral    arthritis  . Hands  Bilateral    Arthritis  . HEMORRHOID SURGERY    . right lobectomy  10/2004  . TONSILLECTOMY AND ADENOIDECTOMY      Social History   Socioeconomic History  . Marital status: Widowed    Spouse name: Not on file  . Number of children: 3  . Years of education: LPN  . Highest education level: Not on file  Occupational History  . Occupation: retired    Fish farm manager: RETIRED    Comment: LPN at [employer]  Tobacco Use  . Smoking status: Former Smoker    Packs/day: 1.00    Years: 20.00    Pack years: 20.00    Types: Cigarettes    Quit date: 11/10/2004    Years since quitting: 16.1  . Smokeless tobacco: Never Used  Vaping Use  . Vaping Use: Never used  Substance and Sexual Activity  . Alcohol use: Yes    Alcohol/week: 1.0 standard drink    Types: 1 Glasses of wine per week    Comment: socially  . Drug use: No  . Sexual activity: Not Currently    Comment: 1st intercourse- 19, partners- 5,   Other Topics Concern  . Not on file  Social History Narrative   Patient drinks about 3 cups of coffee daily.   Patient is right handed.    Social Determinants of Health   Financial Resource Strain: Low  Risk   . Difficulty of Paying Living Expenses: Not very hard  Food Insecurity: Not on file  Transportation Needs: Not on file  Physical Activity: Not on file  Stress: Not on file  Social Connections: Not on file    Family History  Problem Relation Age of Onset  . Emphysema Mother   . Asthma Mother   . Lung cancer Father   . Pancreatic cancer Father   . Bone cancer Father   . Heart disease Brother        x1  . Diabetes Brother   . Hypertension Brother   . Lupus Brother   . Heart disease Sister        x2  . Diabetes Sister   . Hypertension Sister   . Diabetes Sister   . Hypertension Sister   . Diabetes Brother   . Hypertension Brother   . Stroke Maternal Grandfather   . Hypertension Son   . Tremor Son     Review of Systems  Constitutional: Negative for chills and fever.  HENT: Negative for congestion, ear pain, sinus pain and sore throat.   Respiratory: Negative for cough, shortness of breath and wheezing.   Musculoskeletal: Positive for arthralgias and neck pain.  Neurological: Positive for dizziness and headaches.       Objective:   Vitals:   01/03/21 1448  BP: 120/72  Pulse: 60  Temp: 98.3 F (36.8 C)  SpO2: 93%   BP Readings from Last 3 Encounters:  01/03/21 120/72  12/23/20 122/78  12/06/20 122/75   Wt Readings from Last 3 Encounters:  01/03/21 139 lb (63 kg)  12/06/20 140 lb (63.5 kg)  11/29/20 141 lb (64 kg)   Body mass index is 25.42 kg/m.   Physical Exam Constitutional:  General: She is not in acute distress.    Appearance: Normal appearance. She is not ill-appearing.  HENT:     Head: Normocephalic and atraumatic.     Right Ear: Tympanic membrane, ear canal and external ear normal.     Left Ear: Tympanic membrane, ear canal and external ear normal.  Eyes:     Conjunctiva/sclera: Conjunctivae normal.  Cardiovascular:     Rate and Rhythm: Normal rate and regular rhythm.  Pulmonary:     Effort: Pulmonary effort is normal.      Breath sounds: Normal breath sounds.  Musculoskeletal:        General: Tenderness (Cervical spine, upper back) present.  Skin:    General: Skin is warm and dry.  Neurological:     Mental Status: She is alert.     Sensory: Sensory deficit present.     Gait: Gait abnormal (Some slight unsteadiness-this is chronic).            Assessment & Plan:    See Problem List for Assessment and Plan of chronic medical problems.    This visit occurred during the SARS-CoV-2 public health emergency.  Safety protocols were in place, including screening questions prior to the visit, additional usage of staff PPE, and extensive cleaning of exam room while observing appropriate contact time as indicated for disinfecting solutions.

## 2021-01-03 ENCOUNTER — Other Ambulatory Visit: Payer: Self-pay

## 2021-01-03 ENCOUNTER — Ambulatory Visit (INDEPENDENT_AMBULATORY_CARE_PROVIDER_SITE_OTHER): Payer: Medicare Other

## 2021-01-03 ENCOUNTER — Encounter: Payer: Self-pay | Admitting: Internal Medicine

## 2021-01-03 ENCOUNTER — Ambulatory Visit (INDEPENDENT_AMBULATORY_CARE_PROVIDER_SITE_OTHER): Payer: Medicare Other | Admitting: Internal Medicine

## 2021-01-03 VITALS — BP 120/72 | HR 60 | Temp 98.3°F | Ht 62.0 in | Wt 139.0 lb

## 2021-01-03 DIAGNOSIS — H8110 Benign paroxysmal vertigo, unspecified ear: Secondary | ICD-10-CM | POA: Diagnosis not present

## 2021-01-03 DIAGNOSIS — G4486 Cervicogenic headache: Secondary | ICD-10-CM

## 2021-01-03 DIAGNOSIS — M542 Cervicalgia: Secondary | ICD-10-CM | POA: Diagnosis not present

## 2021-01-03 MED ORDER — MECLIZINE HCL 25 MG PO TABS: 12.5000 mg | ORAL_TABLET | Freq: Three times a day (TID) | ORAL | 5 refills | Status: AC | PRN

## 2021-01-03 NOTE — Patient Instructions (Addendum)
Have an xray of your neck downstairs.   Take the meclizine as needed for your dizziness.   A referral was ordered for PT - they will call you.

## 2021-01-03 NOTE — Assessment & Plan Note (Signed)
Acute on chronic She has known BPPV and her symptoms are consistent with this Experiencing dizziness, especially with head movements Meclizine is effective-we will refill today-meclizine 12.5-25 mg 3 times daily as needed for dizziness Will refer to neuro PT, which has been effective for her in the past

## 2021-01-03 NOTE — Assessment & Plan Note (Signed)
Acute on chronic This is an ongoing problem with her for which she follows with neurology for She is currently taking gabapentin 100 mg twice daily-was on 100 mg 3 times daily, but this made her too drowsy Her symptoms are consistent with what she is experienced in the past with this Discussed that we could increase the gabapentin, but it did cause drowsiness in the past and we will hold off on that She will continue applying heat, taking Tylenol, ibuprofen or tramadol as needed for pain She may benefit from PT, but given her RA will get cervical spine x-ray Has follow-up with neurology next month

## 2021-01-06 ENCOUNTER — Ambulatory Visit (INDEPENDENT_AMBULATORY_CARE_PROVIDER_SITE_OTHER): Payer: Medicare Other | Admitting: Internal Medicine

## 2021-01-06 ENCOUNTER — Telehealth: Payer: Self-pay | Admitting: Internal Medicine

## 2021-01-06 ENCOUNTER — Ambulatory Visit
Admission: RE | Admit: 2021-01-06 | Discharge: 2021-01-06 | Disposition: A | Discharge status: Home / Self Care | Payer: Medicare Other | Source: Ambulatory Visit | Attending: Internal Medicine | Admitting: Internal Medicine

## 2021-01-06 ENCOUNTER — Other Ambulatory Visit: Payer: Self-pay

## 2021-01-06 ENCOUNTER — Encounter: Payer: Self-pay | Admitting: Internal Medicine

## 2021-01-06 VITALS — BP 142/68 | HR 70 | Temp 97.9°F | Ht 62.0 in | Wt 139.0 lb

## 2021-01-06 DIAGNOSIS — S0990XA Unspecified injury of head, initial encounter: Secondary | ICD-10-CM

## 2021-01-06 DIAGNOSIS — I6782 Cerebral ischemia: Secondary | ICD-10-CM | POA: Diagnosis not present

## 2021-01-06 NOTE — Telephone Encounter (Signed)
Appointment made for patient to come in and be seen today.

## 2021-01-06 NOTE — Telephone Encounter (Signed)
After the patient was seen on 01.07.22 the patient had a fall and hit her head on the concrete, they took her to the ED but because of the wait they did leave. Granddaughter has been checking her BP and doing neuro checks because she does have some medical background but she wants to know what Dr. Quay Burow recommends. She thinks she might need a CT just because she hit her head but she still wanted to ask.  Katharine [phone]

## 2021-01-06 NOTE — Assessment & Plan Note (Addendum)
Acute Hit her head 3 days ago - fell and hit her posterior head on concrete- no LOC She did got to ED but wait was too long Daughter is a Marine scientist and she has been doing neuro checks and she has been fine Headache x 2 days, has chronic dizziness - no change. No new concerning symptoms BP has been a little high Will get CT of head to r/o bleed - low risk since she is not on blood thinner, except asa 81 mg, but given age will get CT today, which is what she would prefer

## 2021-01-06 NOTE — Patient Instructions (Addendum)
A Ct of your head was ordered.

## 2021-01-06 NOTE — Progress Notes (Signed)
Subjective:    Patient ID: Lisa Vega, female    DOB: [birth year], 87 y.o.   MRN: [id number]  HPI The patient is here for an acute visit.   After she was seen on 1/7 she fell and hit her head on concrete.  She fell backwards and hit her head on concrete. At that time they did take her to the emergency room but because of the long wait they left.  Her granddaughter has been checking her blood pressure and doing neuro checks.   No bleeding.  There was no LOC.   She did have a headache intermittent for two days.  No headache today.  Her BP has been high.  No new dizziness/lightheadedness, no confusion.    Medications and allergies reviewed with patient and updated if appropriate.  Patient Active Problem List   Diagnosis Date Noted  . Head trauma 01/06/2021  . Headache, cervicogenic 01/03/2021  . High risk medication use 12/06/2020  . Rosacea 11/29/2020  . RUQ pain 08/01/2019  . Headache syndrome 03/09/2019  . Carotid stenosis, left 09/19/2018  . Benign positional vertigo 03/10/2018  . History of lung cancer 11/29/2017  . Difficulty sleeping 09/22/2017  . Diabetes ([place]) 05/13/2017  . Fatigue 05/12/2017  . CAP (community acquired pneumonia) 05/03/2017  . Orthopnea 01/12/2017  . GERD (gastroesophageal reflux disease) 11/09/2016  . Osteoporosis 05/06/2016  . Depression with anxiety 05/06/2016  . Hiatal hernia 05/06/2016  . Hypothyroidism 05/06/2016  . Tremor 10/28/2015  . Memory difficulties 10/28/2015  . Hoarseness 10/14/2015  . History of vitamin D deficiency 05/02/2015  . 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 ([place]) 05/30/2014  . Diverticulitis of colon without hemorrhage 08/07/2013  . Prolapse of female pelvic organs 06/22/2013  . Neck pain 03/10/2013  . Vaginal atrophy 03/09/2013  . Rectocele 03/09/2013  . Cystocele 03/09/2013  . Urinary incontinence 03/09/2013  . COPD (chronic obstructive pulmonary disease) ([place]) 01/16/2011  . CARCINOMA, LUNG,  NONSMALL CELL 12/17/2010  . Rheumatoid arthritis ([place]) 12/17/2010  . Dyspnea on exertion 12/17/2010    Current Outpatient Medications on File Prior to Visit  Medication Sig Dispense Refill  . acetaminophen (TYLENOL) 325 MG tablet Take 650 mg by mouth every 6 (six) hours as needed for mild pain.     Marland Kitchen albuterol (PROAIR HFA) 108 (90 Base) MCG/ACT inhaler USE 1-2 PUFFS EVERY 4-6 HOURS AS NEEDED 8.5 g 5  . ALPRAZolam (XANAX) 0.25 MG tablet TAKE 1 TABLET BY MOUTH EVERY DAY AS NEEDED FOR ANXIETY 20 tablet 0  . aspirin EC 81 MG tablet Take 81 mg by mouth daily.    . B-D TB SYRINGE 1CC/27GX1/2" 27G X 1/2" 1 ML MISC For use with methotrexate injection weekly 25 each 1  . blood glucose meter kit and supplies KIT Dispense based on patient and insurance preference. Use up to four times daily as directed. (FOR E11.9). 1 each 0  . budesonide-formoterol (SYMBICORT) 160-4.5 MCG/ACT inhaler USE 2 INHALATIONS ORALLY   TWICE DAILY 30.6 g 2  . clidinium-chlordiazePOXIDE (LIBRAX) 5-2.5 MG capsule SMARTSIG:1 Capsule(s) By Mouth Every 12 Hours PRN    . famotidine (PEPCID) 40 MG tablet Take 1 tablet (40 mg total) by mouth daily. (Patient taking differently: Take 40 mg by mouth as needed.) 90 tablet 3  . fluconazole (DIFLUCAN) 150 MG tablet Take 1 tablet (150 mg total) by mouth every 3 (three) days. 2 tablet 0  . folic acid (FOLVITE) 1 MG tablet Take 1 tablet (1 mg total) by mouth daily.  90 tablet 1  . gabapentin (NEURONTIN) 100 MG capsule Take 100 mg by mouth 2 (two) times daily.    Marland Kitchen glucose blood (ONETOUCH ULTRA) test strip Use to check blood sugars twice a day 100 each 5  . ibuprofen (ADVIL,MOTRIN) 200 MG tablet Take 200 mg by mouth every 6 (six) hours as needed for headache or mild pain.    Marland Kitchen levothyroxine (SYNTHROID) 88 MCG tablet Take 1 tablet (88 mcg total) by mouth daily. 90 tablet 1  . linaclotide (LINZESS) 72 MCG capsule Take 72 mcg by mouth daily as needed (constipation).     . meclizine (ANTIVERT) 25 MG  tablet Take 0.5-1 tablets (12.5-25 mg total) by mouth 3 (three) times daily as needed for dizziness. 60 tablet 5  . methotrexate 50 MG/2ML injection Inject 0.7 mLs (17.5 mg total) into the skin once a week. 4 mL 2  . metroNIDAZOLE (METROGEL) 1 % gel Apply topically daily. 45 g 0  . mupirocin ointment (BACTROBAN) 2 % Place 1 application into the nose 2 (two) times daily. 22 g 1  . nitrofurantoin, macrocrystal-monohydrate, (MACROBID) 100 MG capsule Take 1 capsule (100 mg total) by mouth 2 (two) times daily. 14 capsule 0  . omeprazole (PRILOSEC) 40 MG capsule Take 40 mg by mouth daily.    [name] DELICA LANCETS 81X MISC USE TO CHECK SUGARS UP TO 4 TIMES A DAY  0  . Plecanatide (TRULANCE) 3 MG TABS as needed.    . sertraline (ZOLOFT) 100 MG tablet TAKE 1 TABLET BY MOUTH EVERY DAY 90 tablet 1  . Tiotropium Bromide Monohydrate (SPIRIVA RESPIMAT) 2.5 MCG/ACT AERS Inhale 2 puffs into the lungs daily. 12 g 1  . traMADol (ULTRAM) 50 MG tablet Take 50 mg by mouth 3 (three) times daily as needed for moderate pain.   0  . triamcinolone ointment (KENALOG) 0.1 % Apply 1 application topically 2 (two) times daily as needed. 30 g 3  . Vitamin D, Cholecalciferol, 10 MCG (400 UNIT) CHEW Chew by mouth daily. Unsure of current dose     No current facility-administered medications on file prior to visit.    Past Medical History:  Diagnosis Date  . Anxiety   . Arthritis    RA  . BPV (benign positional vertigo) 03/10/2018  . Cancer ([place])    lung ca  . Carotid stenosis, left 09/19/2018  . COPD (chronic obstructive pulmonary disease) ([place])   . Depression   . Dyspnea    with exertion  . GERD (gastroesophageal reflux disease)   . Headache syndrome 03/09/2019  . Hypercholesteremia   . Hypothyroidism   . Memory difficulties 10/28/2015  . Pneumonia     x3  last time 2017  . Pre-diabetes   . Rheumatoid arthritis(714.0)   . Thyroid disease   . Tremor 10/28/2015   Jaw tremor  . Uterine prolapse   . Vitamin D  deficiency     Past Surgical History:  Procedure Laterality Date  . ANGIOPLASTY Left 10/10/2018   Procedure: ANGIOPLASTY using 1cm x 14cm Xenosure Patch;  Surgeon: Marty Heck, MD;  Location: [facility];  Service: Vascular;  Laterality: Left;  . APPENDECTOMY    . COLONOSCOPY W/ POLYPECTOMY    . ENDARTERECTOMY Left 10/10/2018   Procedure: ENDARTERECTOMY CAROTID;  Surgeon: Marty Heck, MD;  Location: [facility];  Service: Vascular;  Laterality: Left;  . EYE SURGERY Bilateral    cataract  . FOOT SURGERY Bilateral    arthritis  . Hands  Bilateral  Arthritis  . HEMORRHOID SURGERY    . right lobectomy  10/2004  . TONSILLECTOMY AND ADENOIDECTOMY      Social History   Socioeconomic History  . Marital status: Widowed    Spouse name: Not on file  . Number of children: 3  . Years of education: LPN  . Highest education level: Not on file  Occupational History  . Occupation: retired    Fish farm manager: RETIRED    Comment: LPN at [employer]  Tobacco Use  . Smoking status: Former Smoker    Packs/day: 1.00    Years: 20.00    Pack years: 20.00    Types: Cigarettes    Quit date: 11/10/2004    Years since quitting: 16.1  . Smokeless tobacco: Never Used  Vaping Use  . Vaping Use: Never used  Substance and Sexual Activity  . Alcohol use: Yes    Alcohol/week: 1.0 standard drink    Types: 1 Glasses of wine per week    Comment: socially  . Drug use: No  . Sexual activity: Not Currently    Comment: 1st intercourse- 19, partners- 5,   Other Topics Concern  . Not on file  Social History Narrative   Patient drinks about 3 cups of coffee daily.   Patient is right handed.    Social Determinants of Health   Financial Resource Strain: Low Risk   . Difficulty of Paying Living Expenses: Not very hard  Food Insecurity: Not on file  Transportation Needs: Not on file  Physical Activity: Not on file  Stress: Not on file  Social Connections: Not on file    Family  History  Problem Relation Age of Onset  . Emphysema Mother   . Asthma Mother   . Lung cancer Father   . Pancreatic cancer Father   . Bone cancer Father   . Heart disease Brother        x1  . Diabetes Brother   . Hypertension Brother   . Lupus Brother   . Heart disease Sister        x2  . Diabetes Sister   . Hypertension Sister   . Diabetes Sister   . Hypertension Sister   . Diabetes Brother   . Hypertension Brother   . Stroke Maternal Grandfather   . Hypertension Son   . Tremor Son     Review of Systems  Constitutional: Negative for fever.  Eyes: Negative for visual disturbance.  Gastrointestinal: Negative for nausea.  Neurological: Positive for dizziness (chronic) and headaches. Negative for numbness.       No new N/T or weakness  Psychiatric/Behavioral: Negative for confusion.       Objective:   Vitals:   01/06/21 1304  BP: (!) 142/68  Pulse: 70  Temp: 97.9 F (36.6 C)  SpO2: 95%   BP Readings from Last 3 Encounters:  01/06/21 (!) 142/68  01/03/21 120/72  12/23/20 122/78   Wt Readings from Last 3 Encounters:  01/06/21 139 lb (63 kg)  01/03/21 139 lb (63 kg)  12/06/20 140 lb (63.5 kg)   Body mass index is 25.42 kg/m.   Physical Exam Constitutional:      General: She is not in acute distress.    Appearance: Normal appearance. She is not ill-appearing.  HENT:     Head: Normocephalic.     Comments: Subcutaneous hematoma posterior head - tender Skin:    General: Skin is warm and dry.     Findings: Bruising (posterior head) present.  Neurological:     General: No focal deficit present.     Mental Status: She is alert.            Assessment & Plan:    See Problem List for Assessment and Plan of chronic medical problems.    This visit occurred during the SARS-CoV-2 public health emergency.  Safety protocols were in place, including screening questions prior to the visit, additional usage of staff PPE, and extensive cleaning of exam room  while observing appropriate contact time as indicated for disinfecting solutions.

## 2021-01-08 ENCOUNTER — Ambulatory Visit: Payer: Medicare Other | Admitting: Nurse Practitioner

## 2021-01-20 ENCOUNTER — Ambulatory Visit: Payer: Medicare Other | Admitting: Nurse Practitioner

## 2021-01-27 ENCOUNTER — Telehealth: Payer: Self-pay | Admitting: Internal Medicine

## 2021-01-27 NOTE — Telephone Encounter (Signed)
   Patient calling to report she tested positive for covid 1/25

## 2021-01-27 NOTE — Progress Notes (Signed)
Virtual Visit via telephone Note  I connected with Lisa Vega on 01/27/21 at  9:30 AM EST by telephone and verified that I am speaking with the correct person using two identifiers.   I discussed the limitations of evaluation and management by telemedicine and the availability of in person appointments. The patient expressed understanding and agreed to proceed.  Present for the visit:  Myself, Dr Billey Gosling, Tomma Lightning.  The patient is currently at home and I am in the office.    No referring provider.    History of Present Illness: This is an acute visit for covid.   She tested positive on 1/25.  1/31 her covid test was negative.  She called for an appointment yesterday because she was not feeling well.  Today she feels a lot better.  Today her Sugar 92,  Wt 136.  She has not checked her BP yet.  Her symptoms started - last Monday. She did not feel good and had a fever that only lasted a few hours.   She was coughing.  She was coughing a lot.  Last night she coughed very little - she used both inhalers and slept pretty good.  She also took cough medicine.  yesterday she felt more SOB which is why she was concerned - but that is better today.  She has had mild SOB.  Today she has a slight headache.    She took prednisone 5mg  x 3-4 days.     She does feel better today  Review of Systems  Constitutional: Positive for fever (transient when it first started).  HENT: Positive for congestion and sinus pain.        Hoarseness  Respiratory: Positive for cough (dry) and shortness of breath (mild). Negative for wheezing.   Neurological: Positive for headaches.     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   Patient is right handed.    Social Determinants of Health   Financial Resource Strain: Low Risk    Difficulty of Paying Living Expenses: Not very hard  Food Insecurity: Not on file  Transportation Needs: Not on file  Physical Activity: Not on file  Stress: Not on file  Social Connections: Not on file     Observations/Objective:    Assessment and Plan:  See Problem List for Assessment and Plan of chronic medical problems.   Follow Up Instructions:  I discussed the assessment and treatment plan with the patient. The patient was provided an opportunity to ask questions and all were answered. The patient agreed with the plan and demonstrated an understanding of the instructions.   The patient was advised to call back or seek an in-person evaluation if the symptoms worsen or if the condition fails to improve as anticipated.  Time spent on telephone call - 9 minutes  Binnie Rail, MD

## 2021-01-28 ENCOUNTER — Telehealth (INDEPENDENT_AMBULATORY_CARE_PROVIDER_SITE_OTHER): Payer: Medicare Other | Admitting: Internal Medicine

## 2021-01-28 ENCOUNTER — Encounter: Payer: Self-pay | Admitting: Internal Medicine

## 2021-01-28 DIAGNOSIS — U071 COVID-19: Secondary | ICD-10-CM | POA: Diagnosis not present

## 2021-01-28 NOTE — Progress Notes (Deleted)
GYNECOLOGY  VISIT  CC:   ***  HPI: 87 y.o. [id number] Widowed White or Caucasian female here for pessary follow up.     GYNECOLOGIC HISTORY: No LMP recorded. Patient is postmenopausal. Contraception: *** Menopausal hormone therapy: ***  Patient Active Problem List   Diagnosis Date Noted  . COVID-19 01/28/2021  . Head trauma 01/06/2021  . Headache, cervicogenic 01/03/2021  . High risk medication use 12/06/2020  . Rosacea 11/29/2020  . RUQ pain 08/01/2019  . Headache syndrome 03/09/2019  . Carotid stenosis, left 09/19/2018  . Benign positional vertigo 03/10/2018  . History of lung cancer 11/29/2017  . Difficulty sleeping 09/22/2017  . Diabetes ([place]) 05/13/2017  . Fatigue 05/12/2017  . CAP (community acquired pneumonia) 05/03/2017  . Orthopnea 01/12/2017  . GERD (gastroesophageal reflux disease) 11/09/2016  . Osteoporosis 05/06/2016  . Depression with anxiety 05/06/2016  . Hiatal hernia 05/06/2016  . Hypothyroidism 05/06/2016  . Tremor 10/28/2015  . Memory difficulties 10/28/2015  . Hoarseness 10/14/2015  . History of vitamin D deficiency 05/02/2015  . 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 ([place]) 05/30/2014  . Diverticulitis of colon without hemorrhage 08/07/2013  . Prolapse of female pelvic organs 06/22/2013  . Neck pain 03/10/2013  . Vaginal atrophy 03/09/2013  . Rectocele 03/09/2013  . Cystocele 03/09/2013  . Urinary incontinence 03/09/2013  . COPD (chronic obstructive pulmonary disease) ([place]) 01/16/2011  . CARCINOMA, LUNG, NONSMALL CELL 12/17/2010  . Rheumatoid arthritis ([place]) 12/17/2010  . Dyspnea on exertion 12/17/2010    Past Medical History:  Diagnosis Date  . Anxiety   . Arthritis    RA  . BPV (benign positional vertigo) 03/10/2018  . Cancer ([place])    lung ca  . Carotid stenosis, left 09/19/2018  . COPD (chronic obstructive pulmonary disease) ([place])   . Depression   . Dyspnea    with exertion  . GERD (gastroesophageal reflux disease)   .  Headache syndrome 03/09/2019  . Hypercholesteremia   . Hypothyroidism   . Memory difficulties 10/28/2015  . Pneumonia     x3  last time 2017  . Pre-diabetes   . Rheumatoid arthritis(714.0)   . Thyroid disease   . Tremor 10/28/2015   Jaw tremor  . Uterine prolapse   . Vitamin D deficiency     Past Surgical History:  Procedure Laterality Date  . ANGIOPLASTY Left 10/10/2018   Procedure: ANGIOPLASTY using 1cm x 14cm Xenosure Patch;  Surgeon: Marty Heck, MD;  Location: [facility];  Service: Vascular;  Laterality: Left;  . APPENDECTOMY    . COLONOSCOPY W/ POLYPECTOMY    . ENDARTERECTOMY Left 10/10/2018   Procedure: ENDARTERECTOMY CAROTID;  Surgeon: Marty Heck, MD;  Location: [facility];  Service: Vascular;  Laterality: Left;  . EYE SURGERY Bilateral    cataract  . FOOT SURGERY Bilateral    arthritis  . Hands  Bilateral    Arthritis  . HEMORRHOID SURGERY    . right lobectomy  10/2004  . TONSILLECTOMY AND ADENOIDECTOMY      MEDS:   Current Outpatient Medications on File Prior to Visit  Medication Sig Dispense Refill  . acetaminophen (TYLENOL) 325 MG tablet Take 650 mg by mouth every 6 (six) hours as needed for mild pain.     Marland Kitchen albuterol (PROAIR HFA) 108 (90 Base) MCG/ACT inhaler USE 1-2 PUFFS EVERY 4-6 HOURS AS NEEDED 8.5 g 5  . ALPRAZolam (XANAX) 0.25 MG tablet TAKE 1 TABLET BY MOUTH EVERY DAY AS NEEDED FOR ANXIETY 20 tablet 0  .  aspirin EC 81 MG tablet Take 81 mg by mouth daily.    . B-D TB SYRINGE 1CC/27GX1/2" 27G X 1/2" 1 ML MISC For use with methotrexate injection weekly 25 each 1  . blood glucose meter kit and supplies KIT Dispense based on patient and insurance preference. Use up to four times daily as directed. (FOR E11.9). 1 each 0  . budesonide-formoterol (SYMBICORT) 160-4.5 MCG/ACT inhaler USE 2 INHALATIONS ORALLY   TWICE DAILY 30.6 g 2  . clidinium-chlordiazePOXIDE (LIBRAX) 5-2.5 MG capsule SMARTSIG:1 Capsule(s) By Mouth Every 12 Hours PRN    . famotidine  (PEPCID) 40 MG tablet Take 1 tablet (40 mg total) by mouth daily. (Patient taking differently: Take 40 mg by mouth as needed.) 90 tablet 3  . fluconazole (DIFLUCAN) 150 MG tablet Take 1 tablet (150 mg total) by mouth every 3 (three) days. 2 tablet 0  . folic acid (FOLVITE) 1 MG tablet Take 1 tablet (1 mg total) by mouth daily. 90 tablet 1  . gabapentin (NEURONTIN) 100 MG capsule Take 100 mg by mouth 2 (two) times daily.    Marland Kitchen glucose blood (ONETOUCH ULTRA) test strip Use to check blood sugars twice a day 100 each 5  . ibuprofen (ADVIL,MOTRIN) 200 MG tablet Take 200 mg by mouth every 6 (six) hours as needed for headache or mild pain.    Marland Kitchen levothyroxine (SYNTHROID) 88 MCG tablet Take 1 tablet (88 mcg total) by mouth daily. 90 tablet 1  . linaclotide (LINZESS) 72 MCG capsule Take 72 mcg by mouth daily as needed (constipation).     . meclizine (ANTIVERT) 25 MG tablet Take 0.5-1 tablets (12.5-25 mg total) by mouth 3 (three) times daily as needed for dizziness. 60 tablet 5  . methotrexate 50 MG/2ML injection Inject 0.7 mLs (17.5 mg total) into the skin once a week. 4 mL 2  . metroNIDAZOLE (METROGEL) 1 % gel Apply topically daily. 45 g 0  . mupirocin ointment (BACTROBAN) 2 % Place 1 application into the nose 2 (two) times daily. 22 g 1  . nitrofurantoin, macrocrystal-monohydrate, (MACROBID) 100 MG capsule Take 1 capsule (100 mg total) by mouth 2 (two) times daily. 14 capsule 0  . omeprazole (PRILOSEC) 40 MG capsule Take 40 mg by mouth daily.    [name] DELICA LANCETS 33A MISC USE TO CHECK SUGARS UP TO 4 TIMES A DAY  0  . Plecanatide (TRULANCE) 3 MG TABS as needed.    . sertraline (ZOLOFT) 100 MG tablet TAKE 1 TABLET BY MOUTH EVERY DAY 90 tablet 1  . Tiotropium Bromide Monohydrate (SPIRIVA RESPIMAT) 2.5 MCG/ACT AERS Inhale 2 puffs into the lungs daily. 12 g 1  . traMADol (ULTRAM) 50 MG tablet Take 50 mg by mouth 3 (three) times daily as needed for moderate pain.   0  . triamcinolone ointment (KENALOG)  0.1 % Apply 1 application topically 2 (two) times daily as needed. 30 g 3  . Vitamin D, Cholecalciferol, 10 MCG (400 UNIT) CHEW Chew by mouth daily. Unsure of current dose     No current facility-administered medications on file prior to visit.    ALLERGIES: Codeine, Demerol, Meperidine hcl, Meperidine hcl, Other, and Sulfa antibiotics  Family History  Problem Relation Age of Onset  . Emphysema Mother   . Asthma Mother   . Lung cancer Father   . Pancreatic cancer Father   . Bone cancer Father   . Heart disease Brother        x1  . Diabetes Brother   .  Hypertension Brother   . Lupus Brother   . Heart disease Sister        x2  . Diabetes Sister   . Hypertension Sister   . Diabetes Sister   . Hypertension Sister   . Diabetes Brother   . Hypertension Brother   . Stroke Maternal Grandfather   . Hypertension Son   . Tremor Son     SH:  ***  Review of Systems  PHYSICAL EXAMINATION:    There were no vitals taken for this visit.    General appearance: alert, cooperative, no acute distress  CV:  {Exam; heart brief:31539} Lungs:  {pe lungs ob:314451::"clear to auscultation, no wheezes, rales or rhonchi, symmetric air entry"} Breasts: {Exam; breast:13139::"normal appearance, no masses or tenderness"} Abdomen: soft, non-tender; bowel sounds normal; no masses,  no organomegaly Lymph:  no inguinal LAD noted  Pelvic: External genitalia:  no lesions              Urethra:  normal appearing urethra with no masses, tenderness or lesions              Bartholins and Skenes: normal                 Vagina: normal appearing vagina               Cervix: {CHL AMB PHY EX CERVIX NORM DEFAULT:[phone]::"no lesions"}              Bimanual Exam:  Uterus:  {CHL AMB PHY EX UTERUS NORM [phone]::"normal size, contour, position, consistency, mobility, non-tender"}              Adnexa: {CHL AMB PHY EX ADNEXA NO MASS DEFAULT:[phone]::"no mass, fullness, tenderness"}                Chaperone, ***, CMA, was present for exam.  Assessment: ***  Plan: ***   {NUMBERS; -10-45 JOINT ROM:10287} minutes of total time was spent for this patient encounter, including preparation, face-to-face counseling with the patient and coordination of care, and documentation of the encounter.

## 2021-01-28 NOTE — Assessment & Plan Note (Signed)
Acute Dx on 1/25 Symptoms mild - yesterday she was concerned b/c of her SOB, which is better today She did take prednisone 5 mg x 3-4 days Continue symptomatic treatment, inhalers No further treatment needed at this time Advised her symptoms may wax and wane, but should not worsen Call with questions

## 2021-01-30 ENCOUNTER — Ambulatory Visit: Payer: Medicare Other | Admitting: Nurse Practitioner

## 2021-01-31 ENCOUNTER — Ambulatory Visit: Payer: Medicare Other | Admitting: Nurse Practitioner

## 2021-02-04 ENCOUNTER — Encounter: Payer: Self-pay | Admitting: Nurse Practitioner

## 2021-02-04 ENCOUNTER — Other Ambulatory Visit: Payer: Self-pay

## 2021-02-04 ENCOUNTER — Ambulatory Visit (INDEPENDENT_AMBULATORY_CARE_PROVIDER_SITE_OTHER): Payer: Medicare Other | Admitting: Nurse Practitioner

## 2021-02-04 VITALS — BP 135/60 | HR 80 | Ht 62.0 in | Wt 138.0 lb

## 2021-02-04 DIAGNOSIS — B3731 Acute candidiasis of vulva and vagina: Secondary | ICD-10-CM

## 2021-02-04 DIAGNOSIS — B373 Candidiasis of vulva and vagina: Secondary | ICD-10-CM | POA: Diagnosis not present

## 2021-02-04 DIAGNOSIS — N898 Other specified noninflammatory disorders of vagina: Secondary | ICD-10-CM | POA: Diagnosis not present

## 2021-02-04 DIAGNOSIS — Z4689 Encounter for fitting and adjustment of other specified devices: Secondary | ICD-10-CM

## 2021-02-04 DIAGNOSIS — R350 Frequency of micturition: Secondary | ICD-10-CM

## 2021-02-04 DIAGNOSIS — N8189 Other female genital prolapse: Secondary | ICD-10-CM

## 2021-02-04 LAB — WET PREP FOR TRICH, YEAST, CLUE

## 2021-02-04 MED ORDER — FLUCONAZOLE 150 MG PO TABS: 150.0000 mg | ORAL_TABLET | ORAL | 0 refills | Status: DC

## 2021-02-04 NOTE — Progress Notes (Signed)
   Acute Office Visit  Subjective:    Patient ID: Lisa Vega, female    DOB: [birth year], 87 y.o.   MRN: [id number]   HPI 87 y.o. presents today for pessary maintenance and fitting. She was seen 12/23/2020 and requested to have pessary removed until next follow up appointment. At last visit pessary appeared to not be fully supporting prolapse so we discussed fitting for a new one. Today she complains of vaginal itching and urinary frequency.    Review of Systems  Constitutional: Negative.   Genitourinary: Positive for frequency, urgency and vaginal pain (itching). Negative for difficulty urinating, dysuria, flank pain, hematuria and vaginal discharge.       Objective:    Physical Exam Constitutional:      Appearance: Normal appearance.  Genitourinary:    General: Normal vulva.     Vagina: Prolapsed vaginal walls present. No vaginal discharge, erythema or bleeding.     Comments: Stage 3 cystocele    BP 135/60   Pulse 80   Ht 5\' 2"  (1.575 m)   Wt 138 lb (62.6 kg)   BMI 25.24 kg/m  Wt Readings from Last 3 Encounters:  02/04/21 138 lb (62.6 kg)  01/06/21 139 lb (63 kg)  01/03/21 139 lb (63 kg)   Wet prep + yeast UA negative     Assessment & Plan:   Problem List Items Addressed This Visit   None   Visit Diagnoses    Encounter for fitting and adjustment of pessary    -  Primary   Pelvic relaxation       Vaginal itching       Relevant Orders   WET PREP FOR TRICH, YEAST, CLUE   Urinary frequency       Relevant Orders   Urinalysis w microscopic + reflex cultur (Completed)   Vaginal candidiasis       Relevant Medications   fluconazole (DIFLUCAN) 150 MG tablet     Plan: Milex ring with support #5 inserted with ease, patient ambulated and urinated with no difficulty. Pessary removed and replaced with old pessary. We will order new device and have patient return to office for insertion. Diflucan 150 mg today and repeat in 3 days for total of 2 doses. UA negative  and symptoms most likely from bladder prolapse. She is agreeable to plan .     Tamela Gammon [facility], 12:08 PM 02/04/2021

## 2021-02-05 ENCOUNTER — Ambulatory Visit (HOSPITAL_BASED_OUTPATIENT_CLINIC_OR_DEPARTMENT_OTHER): Payer: Medicare Other | Admitting: Physical Therapy

## 2021-02-05 DIAGNOSIS — Z961 Presence of intraocular lens: Secondary | ICD-10-CM | POA: Diagnosis not present

## 2021-02-05 DIAGNOSIS — L718 Other rosacea: Secondary | ICD-10-CM | POA: Diagnosis not present

## 2021-02-05 DIAGNOSIS — H02403 Unspecified ptosis of bilateral eyelids: Secondary | ICD-10-CM | POA: Diagnosis not present

## 2021-02-05 DIAGNOSIS — H0014 Chalazion left upper eyelid: Secondary | ICD-10-CM | POA: Diagnosis not present

## 2021-02-05 DIAGNOSIS — H18513 Endothelial corneal dystrophy, bilateral: Secondary | ICD-10-CM | POA: Diagnosis not present

## 2021-02-06 ENCOUNTER — Other Ambulatory Visit: Payer: Self-pay

## 2021-02-06 ENCOUNTER — Encounter: Payer: Self-pay | Admitting: Neurology

## 2021-02-06 ENCOUNTER — Ambulatory Visit (INDEPENDENT_AMBULATORY_CARE_PROVIDER_SITE_OTHER): Payer: Medicare Other | Admitting: Neurology

## 2021-02-06 VITALS — BP 157/84 | HR 68 | Ht 62.0 in | Wt 138.0 lb

## 2021-02-06 DIAGNOSIS — R251 Tremor, unspecified: Secondary | ICD-10-CM

## 2021-02-06 DIAGNOSIS — G4489 Other headache syndrome: Secondary | ICD-10-CM

## 2021-02-06 DIAGNOSIS — H8110 Benign paroxysmal vertigo, unspecified ear: Secondary | ICD-10-CM | POA: Diagnosis not present

## 2021-02-06 LAB — URINALYSIS W MICROSCOPIC + REFLEX CULTURE
Bilirubin Urine: NEGATIVE
Glucose, UA: NEGATIVE
Hgb urine dipstick: NEGATIVE
Ketones, ur: NEGATIVE
Leukocyte Esterase: NEGATIVE
Nitrites, Initial: NEGATIVE
Protein, ur: NEGATIVE
RBC / HPF: NONE SEEN /HPF (ref 0–2)
Specific Gravity, Urine: 1.01 (ref 1.001–1.03)
pH: 5.5 (ref 5.0–8.0)

## 2021-02-06 LAB — URINE CULTURE
MICRO NUMBER:: 11508239
Result:: NO GROWTH
SPECIMEN QUALITY:: ADEQUATE

## 2021-02-06 LAB — CULTURE INDICATED

## 2021-02-06 MED ORDER — GABAPENTIN 100 MG PO CAPS: 100.0000 mg | ORAL_CAPSULE | Freq: Three times a day (TID) | ORAL | 5 refills | Status: DC

## 2021-02-06 NOTE — Progress Notes (Signed)
PATIENT: Lisa Vega DOB: 10-Apr-1932  REASON FOR VISIT: follow up HISTORY FROM: patient  HISTORY OF PRESENT ILLNESS: Today 02/06/21  Lisa Vega is an 87 year old female with history of cerebrovascular disease, dizziness, headache, memory disturbance. Vertigo has come back some, is starting another round of vestibular rehab on Monday. Has a jaw tremor, intermittently, slight worsening overtime. For the headache, we switched from gabapentin to Lyrica. Cymbalta nor Baclofen was not helpful. Lyrica made her pulse decrease reportedly, she stopped it, claims it didn't help. She went back on gabapentin 100 mg twice daily. The headache is "burning" to top of head, isn't everyday. It is scary to her. She will lay down, or if she can't it will go away on its own. Had fall 1 month ago, lost her balance, hit her head. CT head was stable. Feels walking is more staggery at times. Dizziness mostly when rolling over in bed at night. Has RA, that affects her feet and hands. Lives with her daughter. Here today for evaluation accompanied with her daughter.  08/06/2020 SS: Lisa Vega 87 year old female with history of cerebrovascular disease, dizziness, headache, and memory disturbance. She is on gabapentin. Vertigo is well controlled after completing physical therapy. For headache, baclofen was added, was not helpful.  Has a jaw tremor, intermittently.  Continues to report headache, described as burning sensation to the top of her head, sensation can move around, makes her anxious.  It may happen once a day, less than 15 minutes, is not painful. It more scares her.  Is not clear gabapentin is helpful, has not been able to tolerate higher doses due to nausea.  No recent falls.  Lives with her daughter, she drives a car.  She takes Xanax at night if needed.  Presents today for evaluation unaccompanied.  Taking gabapentin 200/300 mg daily. She is quite active and well-appearing.   HISTORY 12/07/2019 SS: [name] is an 87 year old female with history of cerebrovascular disease.  She has history of dizziness, headache, and memory disturbance.  She remains on gabapentin 200 mg 3 times a day.  She completed physical therapy for positional vertigo that was beneficial.  She continues to complain of problems with headache.  She describes a burning sensation to the top of her head, sides of her head, or back of the head.  She reports a sensation may be sharp, is brief and subsides, like a twinge.  She also complains of some neck stiffness.  She has not had any falls. She continues to have numbness to the left side of her neck and jaw from prior surgery.  She does have significant rheumatoid arthritis, that has caused deformity to her hands.  She recently had a cortisone injection to her left knee.  She lives with her daughter, she drives a car.  She thinks gabapentin has been helpful for her headache and jaw tremor.  She does report drowsiness after taking her midday dose.  She continues to report difficulty with short-term memory.  She presents today for evaluation unaccompanied.   REVIEW OF SYSTEMS: Out of a complete 14 system review of symptoms, the patient complains only of the following symptoms, and all other reviewed systems are negative.  Headache  ALLERGIES: Allergies  Allergen Reactions  . Codeine     REACTION: hallucinations  . Demerol   . Meperidine Hcl     REACTION: severe GI upset  . Meperidine Hcl   . Other Other (See Comments)  . Sulfa Antibiotics Rash  HOME MEDICATIONS: Outpatient Medications Prior to Visit  Medication Sig Dispense Refill  . acetaminophen (TYLENOL) 325 MG tablet Take 650 mg by mouth every 6 (six) hours as needed for mild pain.     Marland Kitchen albuterol (PROAIR HFA) 108 (90 Base) MCG/ACT inhaler USE 1-2 PUFFS EVERY 4-6 HOURS AS NEEDED 8.5 g 5  . ALPRAZolam (XANAX) 0.25 MG tablet TAKE 1 TABLET BY MOUTH EVERY DAY AS NEEDED FOR ANXIETY 20 tablet 0  . aspirin EC 81 MG tablet  Take 81 mg by mouth daily.    . B-D TB SYRINGE 1CC/27GX1/2" 27G X 1/2" 1 ML MISC For use with methotrexate injection weekly 25 each 1  . blood glucose meter kit and supplies KIT Dispense based on patient and insurance preference. Use up to four times daily as directed. (FOR E11.9). 1 each 0  . budesonide-formoterol (SYMBICORT) 160-4.5 MCG/ACT inhaler USE 2 INHALATIONS ORALLY   TWICE DAILY 30.6 g 2  . clidinium-chlordiazePOXIDE (LIBRAX) 5-2.5 MG capsule SMARTSIG:1 Capsule(s) By Mouth Every 12 Hours PRN    . famotidine (PEPCID) 40 MG tablet Take 1 tablet (40 mg total) by mouth daily. (Patient taking differently: Take 40 mg by mouth as needed.) 90 tablet 3  . fluconazole (DIFLUCAN) 150 MG tablet Take 1 tablet (150 mg total) by mouth every 3 (three) days. 2 tablet 0  . folic acid (FOLVITE) 1 MG tablet Take 1 tablet (1 mg total) by mouth daily. 90 tablet 1  . glucose blood (ONETOUCH ULTRA) test strip Use to check blood sugars twice a day 100 each 5  . ibuprofen (ADVIL,MOTRIN) 200 MG tablet Take 200 mg by mouth every 6 (six) hours as needed for headache or mild pain.    Marland Kitchen levothyroxine (SYNTHROID) 88 MCG tablet Take 1 tablet (88 mcg total) by mouth daily. 90 tablet 1  . linaclotide (LINZESS) 72 MCG capsule Take 72 mcg by mouth daily as needed (constipation).     . meclizine (ANTIVERT) 25 MG tablet Take 0.5-1 tablets (12.5-25 mg total) by mouth 3 (three) times daily as needed for dizziness. 60 tablet 5  . methotrexate 50 MG/2ML injection Inject 0.7 mLs (17.5 mg total) into the skin once a week. 4 mL 2  . metroNIDAZOLE (METROGEL) 1 % gel Apply topically daily. 45 g 0  . mupirocin ointment (BACTROBAN) 2 % Place 1 application into the nose 2 (two) times daily. 22 g 1  . nitrofurantoin, macrocrystal-monohydrate, (MACROBID) 100 MG capsule Take 1 capsule (100 mg total) by mouth 2 (two) times daily. 14 capsule 0  . omeprazole (PRILOSEC) 40 MG capsule Take 40 mg by mouth daily.    [name] DELICA LANCETS 19T  MISC USE TO CHECK SUGARS UP TO 4 TIMES A DAY  0  . Plecanatide (TRULANCE) 3 MG TABS as needed.    . sertraline (ZOLOFT) 100 MG tablet TAKE 1 TABLET BY MOUTH EVERY DAY 90 tablet 1  . Tiotropium Bromide Monohydrate (SPIRIVA RESPIMAT) 2.5 MCG/ACT AERS Inhale 2 puffs into the lungs daily. 12 g 1  . traMADol (ULTRAM) 50 MG tablet Take 50 mg by mouth 3 (three) times daily as needed for moderate pain.   0  . triamcinolone ointment (KENALOG) 0.1 % Apply 1 application topically 2 (two) times daily as needed. 30 g 3  . Vitamin D, Cholecalciferol, 10 MCG (400 UNIT) CHEW Chew by mouth daily. Unsure of current dose    . gabapentin (NEURONTIN) 100 MG capsule Take 100 mg by mouth 2 (two) times daily.  No facility-administered medications prior to visit.    PAST MEDICAL HISTORY: Past Medical History:  Diagnosis Date  . Anxiety   . Arthritis    RA  . BPV (benign positional vertigo) 03/10/2018  . Cancer ([place])    lung ca  . Carotid stenosis, left 09/19/2018  . COPD (chronic obstructive pulmonary disease) ([place])   . Depression   . Dyspnea    with exertion  . GERD (gastroesophageal reflux disease)   . Headache syndrome 03/09/2019  . Hypercholesteremia   . Hypothyroidism   . Memory difficulties 10/28/2015  . Pneumonia     x3  last time 2017  . Pre-diabetes   . Rheumatoid arthritis(714.0)   . Thyroid disease   . Tremor 10/28/2015   Jaw tremor  . Uterine prolapse   . Vitamin D deficiency     PAST SURGICAL HISTORY: Past Surgical History:  Procedure Laterality Date  . ANGIOPLASTY Left 10/10/2018   Procedure: ANGIOPLASTY using 1cm x 14cm Xenosure Patch;  Surgeon: Marty Heck, MD;  Location: [facility];  Service: Vascular;  Laterality: Left;  . APPENDECTOMY    . COLONOSCOPY W/ POLYPECTOMY    . ENDARTERECTOMY Left 10/10/2018   Procedure: ENDARTERECTOMY CAROTID;  Surgeon: Marty Heck, MD;  Location: [facility];  Service: Vascular;  Laterality: Left;  . EYE SURGERY Bilateral    cataract   . FOOT SURGERY Bilateral    arthritis  . Hands  Bilateral    Arthritis  . HEMORRHOID SURGERY    . right lobectomy  10/2004  . TONSILLECTOMY AND ADENOIDECTOMY      FAMILY HISTORY: Family History  Problem Relation Age of Onset  . Emphysema Mother   . Asthma Mother   . Lung cancer Father   . Pancreatic cancer Father   . Bone cancer Father   . Heart disease Brother        x1  . Diabetes Brother   . Hypertension Brother   . Lupus Brother   . Heart disease Sister        x2  . Diabetes Sister   . Hypertension Sister   . Diabetes Sister   . Hypertension Sister   . Diabetes Brother   . Hypertension Brother   . Stroke Maternal Grandfather   . Hypertension Son   . Tremor Son     SOCIAL HISTORY: Social History   Socioeconomic History  . Marital status: Widowed    Spouse name: Not on file  . Number of children: 3  . Years of education: LPN  . Highest education level: Not on file  Occupational History  . Occupation: retired    Fish farm manager: RETIRED    Comment: LPN at [employer]  Tobacco Use  . Smoking status: Former Smoker    Packs/day: 1.00    Years: 20.00    Pack years: 20.00    Types: Cigarettes    Quit date: 11/10/2004    Years since quitting: 16.2  . Smokeless tobacco: Never Used  Vaping Use  . Vaping Use: Never used  Substance and Sexual Activity  . Alcohol use: Yes    Alcohol/week: 1.0 standard drink    Types: 1 Glasses of wine per week    Comment: socially  . Drug use: No  . Sexual activity: Not Currently    Comment: 1st intercourse- 19, partners- 5,   Other Topics Concern  . Not on file  Social History Narrative   Patient drinks about 3 cups of coffee daily.  Patient is right handed.    Social Determinants of Health   Financial Resource Strain: Low Risk   . Difficulty of Paying Living Expenses: Not very hard  Food Insecurity: Not on file  Transportation Needs: Not on file  Physical Activity: Not on file  Stress: Not on file   Social Connections: Not on file  Intimate Partner Violence: Not on file    PHYSICAL EXAM  Vitals:   02/06/21 1241  BP: (!) 157/84  Pulse: 68  Weight: 138 lb (62.6 kg)  Height: '5\' 2"'  (1.575 m)   Body mass index is 25.24 kg/m.  Generalized: Well developed, in no acute distress   Neurological examination  Mentation: Alert oriented to time, place, history taking. Follows all commands speech and language fluent Cranial nerve II-XII: Pupils were equal round reactive to light. Extraocular movements were full, visual field were full on confrontational test. Facial sensation and strength were normal. Head turning and shoulder shrug were normal and symmetric. Mild jaw tremor noted. Motor: The motor testing reveals 5 over 5 strength of all 4 extremities. Good symmetric motor tone is noted throughout.  Sensory: Sensory testing is intact to soft touch on all 4 extremities. No evidence of extinction is noted.  Coordination: Cerebellar testing reveals good finger-nose-finger and heel-to-shin bilaterally.  Deformities to fingers from RA. Gait and station: Able to stand from seated position without pushoff, gait is fairly normal, caution with turning, steady Reflexes: Deep tendon reflexes are symmetric but depressed throughout  DIAGNOSTIC DATA (LABS, IMAGING, TESTING) - I reviewed patient records, labs, notes, testing and imaging myself where available.  Lab Results  Component Value Date   WBC 4.8 12/06/2020   HGB 13.3 12/06/2020   HCT 40.3 12/06/2020   MCV 95.5 12/06/2020   PLT 177 12/06/2020      Component Value Date/Time   NA 139 12/06/2020 1135   NA 139 08/10/2011 1457   K 4.0 12/06/2020 1135   K 4.4 08/10/2011 1457   CL 103 12/06/2020 1135   CL 98 08/10/2011 1457   CO2 28 12/06/2020 1135   CO2 29 08/10/2011 1457   GLUCOSE 94 12/06/2020 1135   GLUCOSE 98 08/10/2011 1457   BUN 11 12/06/2020 1135   BUN 15 08/10/2011 1457   CREATININE 0.87 12/06/2020 1135   CALCIUM 9.0  12/06/2020 1135   CALCIUM 8.9 08/10/2011 1457   PROT 6.7 12/06/2020 1135   PROT 6.7 04/21/2018 0821   PROT 6.8 08/10/2011 1457   ALBUMIN 4.0 02/02/2020 1144   ALBUMIN 4.0 04/21/2018 0821   AST 24 12/06/2020 1135   AST 26 08/10/2011 1457   ALT 11 12/06/2020 1135   ALT 21 08/10/2011 1457   ALKPHOS 86 02/02/2020 1144   ALKPHOS 67 08/10/2011 1457   BILITOT 0.8 12/06/2020 1135   BILITOT 0.5 04/21/2018 0821   BILITOT 0.70 08/10/2011 1457   GFRNONAA 60 12/06/2020 1135   GFRAA 70 12/06/2020 1135   Lab Results  Component Value Date   CHOL 221 (H) 08/01/2019   HDL 71.30 08/01/2019   LDLCALC 126 (H) 08/01/2019   TRIG 117.0 08/01/2019   CHOLHDL 3 08/01/2019   Lab Results  Component Value Date   HGBA1C 5.9 02/02/2020   Lab Results  Component Value Date   VITAMINB12 354 10/28/2015   Lab Results  Component Value Date   TSH 1.02 02/02/2020    ASSESSMENT AND PLAN 87 y.o. year old female  has a past medical history of Anxiety, Arthritis, BPV (benign positional vertigo) (03/10/2018),  Cancer [facility]), Carotid stenosis, left (09/19/2018), COPD (chronic obstructive pulmonary disease) ([place]), Depression, Dyspnea, GERD (gastroesophageal reflux disease), Headache syndrome (03/09/2019), Hypercholesteremia, Hypothyroidism, Memory difficulties (10/28/2015), Pneumonia, Pre-diabetes, Rheumatoid arthritis(714.0), Thyroid disease, Tremor (10/28/2015), Uterine prolapse, and Vitamin D deficiency. here with:  1.  Cerebrovascular disease, status post left carotid endarterectomy 2.  Positional vertigo 3.  Reports of mild memory disturbance 4.  Headache 5. Jaw tremor  Increase gabapentin 100 mg 3 times daily for headache, may also benefit jaw tremor. Great idea to get back into vestibular rehab, starting Monday, chronic history of recurrence of dizziness, and slight unsteadiness with walking, has had good benefit with vestibular rehab previously. For headache, previously tried and failed Lyrica, Cymbalta, and  baclofen. She will follow-up in 6 months or sooner if needed with Dr. Jannifer Franklin.  CT head post-fall Jan 2022 IMPRESSION: 1. Mild posterior scalp edema. No acute intracranial abnormality. No skull fracture. 2. Age related atrophy and chronic small vessel ischemia.  I spent 30 minutes of face-to-face and non-face-to-face time with patient.  This included previsit chart review, lab review, study review, order entry, electronic health record documentation, patient education.  Butler Denmark, AGNP-C, DNP 02/06/2021, 1:25 PM [facility] [address] [place], [place] [address] [phone]

## 2021-02-06 NOTE — Patient Instructions (Signed)
Increase gabapentin 100 mg 3 times daily Great idea to do vestibular rehab  See you back in 6 months

## 2021-02-07 NOTE — Progress Notes (Signed)
I have read the note, and I agree with the clinical assessment and plan.  Kora Groom K Marquita Lias   

## 2021-02-10 ENCOUNTER — Ambulatory Visit (HOSPITAL_BASED_OUTPATIENT_CLINIC_OR_DEPARTMENT_OTHER): Payer: Medicare Other | Admitting: Physical Therapy

## 2021-02-14 ENCOUNTER — Encounter (HOSPITAL_BASED_OUTPATIENT_CLINIC_OR_DEPARTMENT_OTHER): Payer: Self-pay | Admitting: Physical Therapy

## 2021-02-14 ENCOUNTER — Ambulatory Visit (HOSPITAL_BASED_OUTPATIENT_CLINIC_OR_DEPARTMENT_OTHER): Payer: Medicare Other | Attending: Internal Medicine | Admitting: Physical Therapy

## 2021-02-14 ENCOUNTER — Other Ambulatory Visit: Payer: Self-pay

## 2021-02-14 DIAGNOSIS — M542 Cervicalgia: Secondary | ICD-10-CM | POA: Diagnosis not present

## 2021-02-14 DIAGNOSIS — H8111 Benign paroxysmal vertigo, right ear: Secondary | ICD-10-CM | POA: Diagnosis not present

## 2021-02-14 DIAGNOSIS — R42 Dizziness and giddiness: Secondary | ICD-10-CM | POA: Diagnosis not present

## 2021-02-14 DIAGNOSIS — R293 Abnormal posture: Secondary | ICD-10-CM | POA: Diagnosis not present

## 2021-02-14 NOTE — Therapy (Signed)
[place], [place], [address] Phone: [phone]   Fax:  [phone]  Physical Therapy Evaluation  Patient Details  Name: Lisa Vega MRN: [id number] Date of Birth: [birth year] Referring Provider (PT): Binnie Rail, MD   Encounter Date: 02/14/2021   PT End of Session - 02/14/21 1254    Visit Number 1    Number of Visits 6    Date for PT Re-Evaluation 03/28/21    Authorization Type Medicare    PT Start Time 1300    PT Stop Time 1345    PT Time Calculation (min) 45 min    Equipment Utilized During Treatment Gait belt    Activity Tolerance Patient tolerated treatment well    Behavior During Therapy [facility] for tasks assessed/performed           Past Medical History:  Diagnosis Date  . Anxiety   . Arthritis    RA  . BPV (benign positional vertigo) 03/10/2018  . Cancer ([place])    lung ca  . Carotid stenosis, left 09/19/2018  . COPD (chronic obstructive pulmonary disease) ([place])   . Depression   . Dyspnea    with exertion  . GERD (gastroesophageal reflux disease)   . Headache syndrome 03/09/2019  . Hypercholesteremia   . Hypothyroidism   . Memory difficulties 10/28/2015  . Pneumonia     x3  last time 2017  . Pre-diabetes   . Rheumatoid arthritis(714.0)   . Thyroid disease   . Tremor 10/28/2015   Jaw tremor  . Uterine prolapse   . Vitamin D deficiency     Past Surgical History:  Procedure Laterality Date  . ANGIOPLASTY Left 10/10/2018   Procedure: ANGIOPLASTY using 1cm x 14cm Xenosure Patch;  Surgeon: Marty Heck, MD;  Location: [place];  Service: Vascular;  Laterality: Left;  . APPENDECTOMY    . COLONOSCOPY W/ POLYPECTOMY    . ENDARTERECTOMY Left 10/10/2018   Procedure: ENDARTERECTOMY CAROTID;  Surgeon: Marty Heck, MD;  Location: [place];  Service: Vascular;  Laterality: Left;  . EYE SURGERY Bilateral    cataract  . FOOT SURGERY Bilateral    arthritis  . Hands   Bilateral    Arthritis  . HEMORRHOID SURGERY    . right lobectomy  10/2004  . TONSILLECTOMY AND ADENOIDECTOMY      There were no vitals filed for this visit.    Subjective Assessment - 02/14/21 1302    Subjective Pt reports she's had vertigo and inner ear issues chronically. Pt states she's had treatment 2 different times. Pt states that when she lays on her right side it feels that the bed is spinning. Pt states she got a carotid artery surgery (~1 year ago) and has been getting neck pain.    Pertinent History RA with knee pain, COPD, cancer    Patient Stated Goals Decrease dizziness and improve neck motion              [facility] PT Assessment - 02/14/21 0001      Assessment   Medical Diagnosis G44.86 (ICD-10-CM) - Headache, cervicogenic  H81.10 (ICD-10-CM) - Benign paroxysmal positional vertigo, unspecified laterality    Referring Provider (PT) Quay Burow, [name], MD    Prior Therapy Yes -- 2020 for BPPV      Precautions   Precautions Cervical    Precaution Comments Caution until pt with further info from her surgeon      Balance Screen   Has the  patient fallen in the past 6 months Yes   ~ 8 wks ago   How many times? 1   Pt fell backwards   Has the patient had a decrease in activity level because of a fear of falling?  No    Is the patient reluctant to leave their home because of a fear of falling?  No      Home Social worker Private residence    Living Arrangements Children    Available Help at Discharge Family    Type of [place]      Prior Function   Level of Independence Independent      Observation/Other Assessments   Focus on Therapeutic Outcomes (FOTO)  n/a      Sensation   Light Touch --   Denies any issues     ROM / Strength   AROM / PROM / Strength AROM      AROM   Cervical Flexion 45    Cervical Extension 40   Limited due to onset of dizziness   Cervical - Right Side Bend 30    Cervical - Left Side Bend 8   rotation for compensation    Cervical - Right Rotation WFL    Cervical - Left Rotation 20%      Palpation   Palpation comment TTP along L SCM, scalene and upper trap      Special Tests    Special Tests Cervical    Cervical Tests --   Unable to attempt due to neck pain     Ambulation/Gait   Assistive device None    Gait Pattern Within Functional Limits;Shuffle   Mild unsteadiness     High Level Balance   High Level Balance Comments To be assessed next session as able                  Vestibular Assessment - 02/14/21 0001      Symptom Behavior   Type of Dizziness  Spinning;Unsteady with head/body turns    Frequency of Dizziness daily    Duration of Dizziness 1 minute    Symptom Nature Motion provoked    Aggravating Factors Rolling to right;Walking in a crowd    Relieving Factors Head stationary;Rest    Progression of Symptoms No change since onset   Onset = 3 months ago   History of similar episodes Has had treatment for it x2; believes it may have been occurring since 2005      Oculomotor Exam   Oculomotor Alignment Normal    Ocular ROM WFL    Spontaneous Absent    Gaze-induced  Absent    Smooth Pursuits Saccades   Mild dizziness   Saccades Dysmetria;Slow      Vestibulo-Ocular Reflex   VOR 1 Head Only (x 1 viewing) WFL    VOR to Slow Head Movement Positive right      Positional Testing   Sidelying Test Sidelying Right;Sidelying Left    Horizontal Canal Testing Horizontal Canal Right;Horizontal Canal Left      Sidelying Right   Sidelying Right Duration 35 sec    Sidelying Right Symptoms Upbeat, right rotatory nystagmus      Sidelying Left   Sidelying Left Duration none    Sidelying Left Symptoms No nystagmus              Objective measurements completed on examination: See above findings.        Vestibular Treatment/Exercise - 02/14/21 0001  Vestibular Treatment/Exercise   Vestibular Treatment Provided Canalith Repositioning    Canalith Repositioning Semont  Procedure Right Posterior      Semont Procedure Right Posterior   Number of Reps  2    Overall Response  Improved Symptoms                   PT Short Term Goals - 02/14/21 1355      PT SHORT TERM GOAL #1   Title STG = LTGs             PT Long Term Goals - 02/14/21 1355      PT LONG TERM GOAL #1   Title Patient will have negative testing of BPPV indicating resolution of symptoms.    Baseline Positive for R posterior canal BPPV    Time 6    Period Weeks    Status New    Target Date 03/28/21      PT LONG TERM GOAL #2   Title Pt will be independent with managing her BPPV at home via HEP    Time 6    Period Weeks    Status New    Target Date 03/28/21      PT LONG TERM GOAL #3   Title Pt will demo symmetrical L & R cervical motion    Baseline L side bend and rotation decreased due to pain at this time    Time 6    Period Weeks    Status New    Target Date 03/28/21                  Plan - 02/14/21 1349    Clinical Impression Statement Mrs. Bolen is an 87 y/o F presenting to OPPT due to complaint of dizziness. Pt has had history of dizziness before and treated for BPPV with good response. On assessment, pt demos decreased L neck ROM and s/s consistent with (+) R posterior canalithiasis with postural dysfunction and decreased oculomotor motion. Pt reports decreased neck motion and pain after getting carotid artery surgery -- she plans to follow up with her surgeon for this. PT initiated canalith repositioning today and gentle postural exercises to help to address her neck stiffness. Pt would benefit from PT to address her neck and issues with dizziness to optimize her level of function with daily tasks.    Personal Factors and Comorbidities Age;Comorbidity 2;Comorbidity 1    Comorbidities BPPV, RA, OA, cancer, COPD, GERD, hypercholesterolemia, hypothyroidism; fall on 1/7    Examination-Activity Limitations Bed Mobility;Bend;Reach Overhead;Locomotion Level     Examination-Participation Restrictions Community Activity;Driving    Stability/Clinical Decision Making Evolving/Moderate complexity    Clinical Decision Making Moderate    Rehab Potential Good    PT Frequency 1x / week    PT Duration 6 weeks   starting week of 2/28 per pt request   PT Treatment/Interventions ADLs/Self Care Home Management;Canalith Repostioning;Gait training;Stair training;Functional mobility training;Therapeutic activities;Balance training;Therapeutic exercise;Neuromuscular re-education;Manual techniques;Patient/family education;Passive range of motion;Dry needling;Taping;Vestibular    PT Next Visit Plan Assess response to HEP. Address BPPV with canalith repositioning as able. If no longer symptomic begin to address and fully assess cervical neck motion. Measure balance (consider 5xSTS, Berg, DGI)    PT Home Exercise Plan Access Code: [email or web address]    Consulted and Agree with Plan of Care Patient           Patient will benefit from skilled therapeutic intervention in order to improve the following deficits and impairments:  Decreased range  of motion,Increased fascial restricitons,Impaired UE functional use,Dizziness,Pain,Decreased balance,Hypomobility,Impaired flexibility,Decreased mobility,Improper body mechanics,Postural dysfunction  Visit Diagnosis: BPPV (benign paroxysmal positional vertigo), right  Dizziness and giddiness  Cervicalgia  Abnormal posture     Problem List Patient Active Problem List   Diagnosis Date Noted  . COVID-19 01/28/2021  . Head trauma 01/06/2021  . Headache, cervicogenic 01/03/2021  . High risk medication use 12/06/2020  . Rosacea 11/29/2020  . RUQ pain 08/01/2019  . Headache syndrome 03/09/2019  . Carotid stenosis, left 09/19/2018  . Benign positional vertigo 03/10/2018  . History of lung cancer 11/29/2017  . Difficulty sleeping 09/22/2017  . Diabetes ([place]) 05/13/2017  . Fatigue 05/12/2017  . CAP (community acquired pneumonia)  05/03/2017  . Orthopnea 01/12/2017  . GERD (gastroesophageal reflux disease) 11/09/2016  . Osteoporosis 05/06/2016  . Depression with anxiety 05/06/2016  . Hiatal hernia 05/06/2016  . Hypothyroidism 05/06/2016  . Tremor 10/28/2015  . Memory difficulties 10/28/2015  . Hoarseness 10/14/2015  . History of vitamin D deficiency 05/02/2015  . 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 ([place]) 05/30/2014  . Diverticulitis of colon without hemorrhage 08/07/2013  . Prolapse of female pelvic organs 06/22/2013  . Neck pain 03/10/2013  . Vaginal atrophy 03/09/2013  . Rectocele 03/09/2013  . Cystocele 03/09/2013  . Urinary incontinence 03/09/2013  . COPD (chronic obstructive pulmonary disease) ([place]) 01/16/2011  . CARCINOMA, LUNG, NONSMALL CELL 12/17/2010  . Rheumatoid arthritis ([place]) 12/17/2010  . Dyspnea on exertion 12/17/2010    [facility] [address] [place] PT, DPT 02/14/2021, 2:00 PM  [facility] [place], [place], [address] Phone: [phone]   Fax:  [phone]  Name: Lisa Vega MRN: [id number] Date of Birth: 1932-04-10

## 2021-02-19 ENCOUNTER — Other Ambulatory Visit: Payer: Self-pay | Admitting: Internal Medicine

## 2021-02-21 ENCOUNTER — Inpatient Hospital Stay (HOSPITAL_COMMUNITY): Admission: RE | Admit: 2021-02-21 | Payer: Medicare Other | Source: Ambulatory Visit

## 2021-02-21 ENCOUNTER — Telehealth: Payer: Self-pay

## 2021-02-21 ENCOUNTER — Telehealth: Payer: Self-pay | Admitting: Internal Medicine

## 2021-02-21 NOTE — Telephone Encounter (Signed)
Pt called with c/o headache/dizziness that comes and goes and neck soreness for a week. She stated she was looking in mirror yesterday and noticed L CEA incision was swollen looking. I have encouraged her to call PCP and she has been scheduled for a carotid u/s and to see APP next week, per APP. Pt has left a message with triage at her PCP office. She denies any facial drooping, numbness, slurred speech, confusion, or difficulty seeing/speaking. We discussed these stroke symptoms and that if she experiences any of them she is to call 911/report to ED immediately. Pt verbalized understanding; no further questions/concerns.

## 2021-02-21 NOTE — Telephone Encounter (Signed)
Patient called and was wondering if Dr. Quay Burow would want want to see her before or after she sees Dr. Carlis Abbott on 3.2.22. She can be reached at [phone]. Please advise

## 2021-02-24 ENCOUNTER — Other Ambulatory Visit: Payer: Self-pay

## 2021-02-24 ENCOUNTER — Ambulatory Visit: Payer: Medicare Other | Admitting: Nurse Practitioner

## 2021-02-24 DIAGNOSIS — R142 Eructation: Secondary | ICD-10-CM | POA: Insufficient documentation

## 2021-02-24 DIAGNOSIS — R42 Dizziness and giddiness: Secondary | ICD-10-CM | POA: Insufficient documentation

## 2021-02-24 DIAGNOSIS — K5730 Diverticulosis of large intestine without perforation or abscess without bleeding: Secondary | ICD-10-CM | POA: Insufficient documentation

## 2021-02-24 DIAGNOSIS — E538 Deficiency of other specified B group vitamins: Secondary | ICD-10-CM | POA: Insufficient documentation

## 2021-02-24 DIAGNOSIS — R300 Dysuria: Secondary | ICD-10-CM | POA: Insufficient documentation

## 2021-02-24 DIAGNOSIS — E782 Mixed hyperlipidemia: Secondary | ICD-10-CM | POA: Insufficient documentation

## 2021-02-24 DIAGNOSIS — R141 Gas pain: Secondary | ICD-10-CM | POA: Insufficient documentation

## 2021-02-24 DIAGNOSIS — F411 Generalized anxiety disorder: Secondary | ICD-10-CM | POA: Insufficient documentation

## 2021-02-24 DIAGNOSIS — M2550 Pain in unspecified joint: Secondary | ICD-10-CM | POA: Insufficient documentation

## 2021-02-24 DIAGNOSIS — M25569 Pain in unspecified knee: Secondary | ICD-10-CM | POA: Insufficient documentation

## 2021-02-24 DIAGNOSIS — R1310 Dysphagia, unspecified: Secondary | ICD-10-CM | POA: Insufficient documentation

## 2021-02-24 DIAGNOSIS — E7800 Pure hypercholesterolemia, unspecified: Secondary | ICD-10-CM | POA: Insufficient documentation

## 2021-02-24 DIAGNOSIS — M791 Myalgia, unspecified site: Secondary | ICD-10-CM | POA: Insufficient documentation

## 2021-02-24 DIAGNOSIS — I6522 Occlusion and stenosis of left carotid artery: Secondary | ICD-10-CM

## 2021-02-24 DIAGNOSIS — E559 Vitamin D deficiency, unspecified: Secondary | ICD-10-CM | POA: Insufficient documentation

## 2021-02-24 DIAGNOSIS — I6523 Occlusion and stenosis of bilateral carotid arteries: Secondary | ICD-10-CM

## 2021-02-24 DIAGNOSIS — Z79899 Other long term (current) drug therapy: Secondary | ICD-10-CM | POA: Insufficient documentation

## 2021-02-24 DIAGNOSIS — K5901 Slow transit constipation: Secondary | ICD-10-CM | POA: Insufficient documentation

## 2021-02-24 DIAGNOSIS — Z86010 Personal history of colonic polyps: Secondary | ICD-10-CM | POA: Insufficient documentation

## 2021-02-24 DIAGNOSIS — K2101 Gastro-esophageal reflux disease with esophagitis, with bleeding: Secondary | ICD-10-CM | POA: Insufficient documentation

## 2021-02-24 DIAGNOSIS — G250 Essential tremor: Secondary | ICD-10-CM | POA: Insufficient documentation

## 2021-02-24 NOTE — Telephone Encounter (Signed)
Spoke with patient today. 

## 2021-02-24 NOTE — Telephone Encounter (Signed)
After is good

## 2021-02-25 ENCOUNTER — Encounter (HOSPITAL_COMMUNITY): Payer: Medicare Other

## 2021-02-25 ENCOUNTER — Ambulatory Visit: Payer: Medicare Other

## 2021-02-25 ENCOUNTER — Ambulatory Visit (HOSPITAL_BASED_OUTPATIENT_CLINIC_OR_DEPARTMENT_OTHER): Payer: Medicare Other | Admitting: Physical Therapy

## 2021-02-25 NOTE — Progress Notes (Deleted)
History of Present Illness:  Patient is a 87 y.o. year old female who presents for evaluation of carotid stenosis.  She presented with left symptomatic carotid stenosis >80%.  She describes episodes of amaurosis fugax over the past month and CTA showed high grade left ICA stenosis. S/P left  CEA by Dr. Carlis Abbott 10/10/18.  The patient denies symptoms of TIA, amaurosis, or stroke.      Past Medical History:  Diagnosis Date  . Anxiety   . Arthritis    RA  . BPV (benign positional vertigo) 03/10/2018  . Cancer ([place])    lung ca  . Carotid stenosis, left 09/19/2018  . COPD (chronic obstructive pulmonary disease) ([place])   . Depression   . Dyspnea    with exertion  . GERD (gastroesophageal reflux disease)   . Headache syndrome 03/09/2019  . Hypercholesteremia   . Hypothyroidism   . Memory difficulties 10/28/2015  . Pneumonia     x3  last time 2017  . Pre-diabetes   . Rheumatoid arthritis(714.0)   . Thyroid disease   . Tremor 10/28/2015   Jaw tremor  . Uterine prolapse   . Vitamin D deficiency     Past Surgical History:  Procedure Laterality Date  . ANGIOPLASTY Left 10/10/2018   Procedure: ANGIOPLASTY using 1cm x 14cm Xenosure Patch;  Surgeon: Marty Heck, MD;  Location: [place];  Service: Vascular;  Laterality: Left;  . APPENDECTOMY    . COLONOSCOPY W/ POLYPECTOMY    . ENDARTERECTOMY Left 10/10/2018   Procedure: ENDARTERECTOMY CAROTID;  Surgeon: Marty Heck, MD;  Location: [place];  Service: Vascular;  Laterality: Left;  . EYE SURGERY Bilateral    cataract  . FOOT SURGERY Bilateral    arthritis  . Hands  Bilateral    Arthritis  . HEMORRHOID SURGERY    . right lobectomy  10/2004  . TONSILLECTOMY AND ADENOIDECTOMY       Social History Social History   Tobacco Use  . Smoking status: Former Smoker    Packs/day: 1.00    Years: 20.00    Pack years: 20.00    Types: Cigarettes    Quit date: 11/10/2004    Years since quitting: 16.3  . Smokeless  tobacco: Never Used  Vaping Use  . Vaping Use: Never used  Substance Use Topics  . Alcohol use: Yes    Alcohol/week: 1.0 standard drink    Types: 1 Glasses of wine per week    Comment: socially  . Drug use: No    Family History Family History  Problem Relation Age of Onset  . Emphysema Mother   . Asthma Mother   . Lung cancer Father   . Pancreatic cancer Father   . Bone cancer Father   . Heart disease Brother        x1  . Diabetes Brother   . Hypertension Brother   . Lupus Brother   . Heart disease Sister        x2  . Diabetes Sister   . Hypertension Sister   . Diabetes Sister   . Hypertension Sister   . Diabetes Brother   . Hypertension Brother   . Stroke Maternal Grandfather   . Hypertension Son   . Tremor Son     Allergies  Allergies  Allergen Reactions  . Codeine     REACTION: hallucinations  . Demerol   . Meperidine Hcl     REACTION: severe GI upset  . Meperidine  Hcl   . Other Other (See Comments)  . Sulfa Antibiotics Rash     Current Outpatient Medications  Medication Sig Dispense Refill  . acetaminophen (TYLENOL) 325 MG tablet Take 650 mg by mouth every 6 (six) hours as needed for mild pain.     Marland Kitchen albuterol (PROAIR HFA) 108 (90 Base) MCG/ACT inhaler USE 1-2 PUFFS EVERY 4-6 HOURS AS NEEDED 8.5 g 5  . ALPRAZolam (XANAX) 0.25 MG tablet TAKE 1 TABLET BY MOUTH EVERY DAY AS NEEDED FOR ANXIETY 20 tablet 0  . aspirin EC 81 MG tablet Take 81 mg by mouth daily.    . B-D TB SYRINGE 1CC/27GX1/2" 27G X 1/2" 1 ML MISC For use with methotrexate injection weekly 25 each 1  . blood glucose meter kit and supplies KIT Dispense based on patient and insurance preference. Use up to four times daily as directed. (FOR E11.9). 1 each 0  . budesonide-formoterol (SYMBICORT) 160-4.5 MCG/ACT inhaler USE 2 INHALATIONS ORALLY   TWICE DAILY 30.6 g 2  . clidinium-chlordiazePOXIDE (LIBRAX) 5-2.5 MG capsule SMARTSIG:1 Capsule(s) By Mouth Every 12 Hours PRN    . famotidine (PEPCID)  40 MG tablet Take 1 tablet (40 mg total) by mouth daily. (Patient taking differently: Take 40 mg by mouth as needed.) 90 tablet 3  . fluconazole (DIFLUCAN) 150 MG tablet Take 1 tablet (150 mg total) by mouth every 3 (three) days. 2 tablet 0  . folic acid (FOLVITE) 1 MG tablet Take 1 tablet (1 mg total) by mouth daily. 90 tablet 1  . gabapentin (NEURONTIN) 100 MG capsule Take 1 capsule (100 mg total) by mouth 3 (three) times daily. 90 capsule 5  . glucose blood (ONETOUCH ULTRA) test strip Use to check blood sugars twice a day 100 each 5  . ibuprofen (ADVIL,MOTRIN) 200 MG tablet Take 200 mg by mouth every 6 (six) hours as needed for headache or mild pain.    Marland Kitchen linaclotide (LINZESS) 72 MCG capsule Take 72 mcg by mouth daily as needed (constipation).     . meclizine (ANTIVERT) 25 MG tablet Take 0.5-1 tablets (12.5-25 mg total) by mouth 3 (three) times daily as needed for dizziness. 60 tablet 5  . methotrexate 50 MG/2ML injection Inject 0.7 mLs (17.5 mg total) into the skin once a week. 4 mL 2  . metroNIDAZOLE (METROGEL) 1 % gel Apply topically daily. 45 g 0  . mupirocin ointment (BACTROBAN) 2 % Place 1 application into the nose 2 (two) times daily. 22 g 1  . nitrofurantoin, macrocrystal-monohydrate, (MACROBID) 100 MG capsule Take 1 capsule (100 mg total) by mouth 2 (two) times daily. 14 capsule 0  . omeprazole (PRILOSEC) 40 MG capsule Take 40 mg by mouth daily.    [name] DELICA LANCETS 51O MISC USE TO CHECK SUGARS UP TO 4 TIMES A DAY  0  . Plecanatide (TRULANCE) 3 MG TABS as needed.    . sertraline (ZOLOFT) 100 MG tablet TAKE 1 TABLET BY MOUTH EVERY DAY 90 tablet 1  . SYNTHROID 88 MCG tablet TAKE 1 TABLET DAILY 90 tablet 1  . Tiotropium Bromide Monohydrate (SPIRIVA RESPIMAT) 2.5 MCG/ACT AERS Inhale 2 puffs into the lungs daily. 12 g 1  . traMADol (ULTRAM) 50 MG tablet Take 50 mg by mouth 3 (three) times daily as needed for moderate pain.   0  . triamcinolone ointment (KENALOG) 0.1 % Apply 1  application topically 2 (two) times daily as needed. 30 g 3  . Vitamin D, Cholecalciferol, 10 MCG (400 UNIT)  CHEW Chew by mouth daily. Unsure of current dose     No current facility-administered medications for this visit.    ROS:   General:  No weight loss, Fever, chills  HEENT: No recent headaches, no nasal bleeding, no visual changes, no sore throat  Neurologic: No dizziness, blackouts, seizures. No recent symptoms of stroke or mini- stroke. No recent episodes of slurred speech, or temporary blindness.  Cardiac: No recent episodes of chest pain/pressure, no shortness of breath at rest.  No shortness of breath with exertion.  Denies history of atrial fibrillation or irregular heartbeat  Vascular: No history of rest pain in feet.  No history of claudication.  No history of non-healing ulcer, No history of DVT   Pulmonary: No home oxygen, no productive cough, no hemoptysis,  No asthma or wheezing  Musculoskeletal:  [ ] Arthritis, [ ] Low back pain,  [ ] Joint pain  Hematologic:No history of hypercoagulable state.  No history of easy bleeding.  No history of anemia  Gastrointestinal: No hematochezia or melena,  No gastroesophageal reflux, no trouble swallowing  Urinary: [ ] chronic Kidney disease, [ ] on HD - [ ] MWF or [ ] TTHS, [ ] Burning with urination, [ ] Frequent urination, [ ] Difficulty urinating;   Skin: No rashes  Psychological: No history of anxiety,  No history of depression   Physical Examination  There were no vitals filed for this visit.  There is no height or weight on file to calculate BMI.  General:  Alert and oriented, no acute distress HEENT: Normal Neck: No bruit or JVD Pulmonary: Clear to auscultation bilaterally Cardiac: Regular Rate and Rhythm without murmur Gastrointestinal: Soft, non-tender, non-distended, no mass, no scars Skin: No rash Extremity Pulses:  2+ radial, brachial, femoral, dorsalis pedis, posterior tibial pulses  bilaterally Musculoskeletal: No deformity or edema  Neurologic: Upper and lower extremity motor 5/5 and symmetric  DATA:    ASSESSMENT:    PLAN:   Roxy Horseman PA-C [facility] Office: [phone]  MD in clinic [place]

## 2021-02-27 ENCOUNTER — Encounter (HOSPITAL_COMMUNITY): Payer: Medicare Other

## 2021-02-27 ENCOUNTER — Ambulatory Visit: Payer: Medicare Other

## 2021-02-28 DIAGNOSIS — M25562 Pain in left knee: Secondary | ICD-10-CM | POA: Diagnosis not present

## 2021-02-28 DIAGNOSIS — M25552 Pain in left hip: Secondary | ICD-10-CM | POA: Diagnosis not present

## 2021-03-03 ENCOUNTER — Other Ambulatory Visit: Payer: Self-pay | Admitting: Physical Medicine and Rehabilitation

## 2021-03-03 DIAGNOSIS — M25552 Pain in left hip: Secondary | ICD-10-CM

## 2021-03-04 ENCOUNTER — Ambulatory Visit
Admission: RE | Admit: 2021-03-04 | Discharge: 2021-03-04 | Disposition: A | Discharge status: Home / Self Care | Payer: Medicare Other | Source: Ambulatory Visit | Attending: Physical Medicine and Rehabilitation | Admitting: Physical Medicine and Rehabilitation

## 2021-03-04 DIAGNOSIS — M8548 Solitary bone cyst, other site: Secondary | ICD-10-CM | POA: Diagnosis not present

## 2021-03-04 DIAGNOSIS — M25552 Pain in left hip: Secondary | ICD-10-CM | POA: Diagnosis not present

## 2021-03-06 ENCOUNTER — Ambulatory Visit (HOSPITAL_BASED_OUTPATIENT_CLINIC_OR_DEPARTMENT_OTHER): Payer: Medicare Other | Admitting: Physical Therapy

## 2021-03-06 ENCOUNTER — Other Ambulatory Visit: Payer: Medicare Other

## 2021-03-07 ENCOUNTER — Ambulatory Visit: Payer: Medicare Other | Admitting: Internal Medicine

## 2021-03-07 DIAGNOSIS — M5030 Other cervical disc degeneration, unspecified cervical region: Secondary | ICD-10-CM | POA: Diagnosis not present

## 2021-03-07 DIAGNOSIS — M1712 Unilateral primary osteoarthritis, left knee: Secondary | ICD-10-CM | POA: Diagnosis not present

## 2021-03-07 DIAGNOSIS — M25552 Pain in left hip: Secondary | ICD-10-CM | POA: Diagnosis not present

## 2021-03-11 ENCOUNTER — Ambulatory Visit: Payer: Medicare Other | Admitting: Obstetrics and Gynecology

## 2021-03-11 NOTE — Progress Notes (Deleted)
GYNECOLOGY  VISIT   HPI: 87 y.o.   Widowed White or Caucasian Not Hispanic or Latino  female   [phone] with No LMP recorded. Patient is postmenopausal.   here for     GYNECOLOGIC HISTORY: No LMP recorded. Patient is postmenopausal. Contraception:*** Menopausal hormone therapy: ***        OB History    Gravida  5   Para  3   Term      Preterm      AB  2   Living  3     SAB  1   IAB      Ectopic  1   Multiple      Live Births                 Patient Active Problem List   Diagnosis Date Noted  . Arthralgia of lower leg 02/24/2021  . On long term drug therapy 02/24/2021  . Myalgia 02/24/2021  . Anxiety state 02/24/2021  . Diverticular disease of colon 02/24/2021  . Dizziness and giddiness 02/24/2021  . Dysphagia 02/24/2021  . Essential tremor 02/24/2021  . Flatulence, eructation and gas pain 02/24/2021  . Gastroesophageal reflux disease with esophagitis and hemorrhage 02/24/2021  . History of colonic polyps 02/24/2021  . Mixed hyperlipidemia 02/24/2021  . Multiple joint pain 02/24/2021  . Pure hypercholesterolemia 02/24/2021  . Vitamin B12 deficiency 02/24/2021  . Vitamin D deficiency 02/24/2021  . Dysuria 02/24/2021  . Slow transit constipation 02/24/2021  . COVID-19 01/28/2021  . Head trauma 01/06/2021  . Headache, cervicogenic 01/03/2021  . High risk medication use 12/06/2020  . Rosacea 11/29/2020  . Cervical spondylosis 04/18/2020  . RUQ pain 08/01/2019  . Primary osteoarthritis of first carpometacarpal joint of right hand 05/10/2019  . Rheumatoid arthritis involving right hand ([place]) 05/10/2019  . Headache syndrome 03/09/2019  . Carotid stenosis, left 09/19/2018  . Osteoarthritis of left knee 05/12/2018  . Benign positional vertigo 03/10/2018  . History of lung cancer 11/29/2017  . Difficulty sleeping 09/22/2017  . Diabetes ([place]) 05/13/2017  . Fatigue 05/12/2017  . CAP (community acquired pneumonia) 05/03/2017  . Orthopnea 01/12/2017  .  GERD (gastroesophageal reflux disease) 11/09/2016  . Osteoporosis 05/06/2016  . Depression with anxiety 05/06/2016  . Hiatal hernia 05/06/2016  . Hypothyroidism 05/06/2016  . Tremor 10/28/2015  . Memory difficulties 10/28/2015  . Hoarseness 10/14/2015  . History of vitamin D deficiency 05/02/2015  . 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 ([place]) 05/30/2014  . Diverticulitis of colon without hemorrhage 08/07/2013  . Prolapse of female pelvic organs 06/22/2013  . Neck pain 03/10/2013  . Vaginal atrophy 03/09/2013  . Rectocele 03/09/2013  . Cystocele 03/09/2013  . Urinary incontinence 03/09/2013  . COPD (chronic obstructive pulmonary disease) ([place]) 01/16/2011  . CARCINOMA, LUNG, NONSMALL CELL 12/17/2010  . Rheumatoid arthritis ([place]) 12/17/2010  . Dyspnea on exertion 12/17/2010    Past Medical History:  Diagnosis Date  . Anxiety   . Arthritis    RA  . BPV (benign positional vertigo) 03/10/2018  . Cancer ([place])    lung ca  . Carotid stenosis, left 09/19/2018  . COPD (chronic obstructive pulmonary disease) ([place])   . Depression   . Dyspnea    with exertion  . GERD (gastroesophageal reflux disease)   . Headache syndrome 03/09/2019  . Hypercholesteremia   . Hypothyroidism   . Memory difficulties 10/28/2015  . Pneumonia     x3  last time 2017  . Pre-diabetes   . Rheumatoid arthritis(714.0)   .  Thyroid disease   . Tremor 10/28/2015   Jaw tremor  . Uterine prolapse   . Vitamin D deficiency     Past Surgical History:  Procedure Laterality Date  . ANGIOPLASTY Left 10/10/2018   Procedure: ANGIOPLASTY using 1cm x 14cm Xenosure Patch;  Surgeon: Marty Heck, MD;  Location: [facility];  Service: Vascular;  Laterality: Left;  . APPENDECTOMY    . COLONOSCOPY W/ POLYPECTOMY    . ENDARTERECTOMY Left 10/10/2018   Procedure: ENDARTERECTOMY CAROTID;  Surgeon: Marty Heck, MD;  Location: [facility];  Service: Vascular;  Laterality: Left;  . EYE SURGERY Bilateral     cataract  . FOOT SURGERY Bilateral    arthritis  . Hands  Bilateral    Arthritis  . HEMORRHOID SURGERY    . right lobectomy  10/2004  . TONSILLECTOMY AND ADENOIDECTOMY      Current Outpatient Medications  Medication Sig Dispense Refill  . acetaminophen (TYLENOL) 325 MG tablet Take 650 mg by mouth every 6 (six) hours as needed for mild pain.     Marland Kitchen albuterol (PROAIR HFA) 108 (90 Base) MCG/ACT inhaler USE 1-2 PUFFS EVERY 4-6 HOURS AS NEEDED 8.5 g 5  . ALPRAZolam (XANAX) 0.25 MG tablet TAKE 1 TABLET BY MOUTH EVERY DAY AS NEEDED FOR ANXIETY 20 tablet 0  . aspirin EC 81 MG tablet Take 81 mg by mouth daily.    . B-D TB SYRINGE 1CC/27GX1/2" 27G X 1/2" 1 ML MISC For use with methotrexate injection weekly 25 each 1  . blood glucose meter kit and supplies KIT Dispense based on patient and insurance preference. Use up to four times daily as directed. (FOR E11.9). 1 each 0  . budesonide-formoterol (SYMBICORT) 160-4.5 MCG/ACT inhaler USE 2 INHALATIONS ORALLY   TWICE DAILY 30.6 g 2  . clidinium-chlordiazePOXIDE (LIBRAX) 5-2.5 MG capsule SMARTSIG:1 Capsule(s) By Mouth Every 12 Hours PRN    . famotidine (PEPCID) 40 MG tablet Take 1 tablet (40 mg total) by mouth daily. (Patient taking differently: Take 40 mg by mouth as needed.) 90 tablet 3  . fluconazole (DIFLUCAN) 150 MG tablet Take 1 tablet (150 mg total) by mouth every 3 (three) days. 2 tablet 0  . folic acid (FOLVITE) 1 MG tablet Take 1 tablet (1 mg total) by mouth daily. 90 tablet 1  . gabapentin (NEURONTIN) 100 MG capsule Take 1 capsule (100 mg total) by mouth 3 (three) times daily. 90 capsule 5  . glucose blood (ONETOUCH ULTRA) test strip Use to check blood sugars twice a day 100 each 5  . ibuprofen (ADVIL,MOTRIN) 200 MG tablet Take 200 mg by mouth every 6 (six) hours as needed for headache or mild pain.    Marland Kitchen linaclotide (LINZESS) 72 MCG capsule Take 72 mcg by mouth daily as needed (constipation).     . meclizine (ANTIVERT) 25 MG tablet Take 0.5-1  tablets (12.5-25 mg total) by mouth 3 (three) times daily as needed for dizziness. 60 tablet 5  . methotrexate 50 MG/2ML injection Inject 0.7 mLs (17.5 mg total) into the skin once a week. 4 mL 2  . metroNIDAZOLE (METROGEL) 1 % gel Apply topically daily. 45 g 0  . mupirocin ointment (BACTROBAN) 2 % Place 1 application into the nose 2 (two) times daily. 22 g 1  . nitrofurantoin, macrocrystal-monohydrate, (MACROBID) 100 MG capsule Take 1 capsule (100 mg total) by mouth 2 (two) times daily. 14 capsule 0  . omeprazole (PRILOSEC) 40 MG capsule Take 40 mg by mouth daily.    Marland Kitchen  ONETOUCH DELICA LANCETS 90W MISC USE TO CHECK SUGARS UP TO 4 TIMES A DAY  0  . Plecanatide (TRULANCE) 3 MG TABS as needed.    . sertraline (ZOLOFT) 100 MG tablet TAKE 1 TABLET BY MOUTH EVERY DAY 90 tablet 1  . SYNTHROID 88 MCG tablet TAKE 1 TABLET DAILY 90 tablet 1  . Tiotropium Bromide Monohydrate (SPIRIVA RESPIMAT) 2.5 MCG/ACT AERS Inhale 2 puffs into the lungs daily. 12 g 1  . traMADol (ULTRAM) 50 MG tablet Take 50 mg by mouth 3 (three) times daily as needed for moderate pain.   0  . triamcinolone ointment (KENALOG) 0.1 % Apply 1 application topically 2 (two) times daily as needed. 30 g 3  . Vitamin D, Cholecalciferol, 10 MCG (400 UNIT) CHEW Chew by mouth daily. Unsure of current dose     No current facility-administered medications for this visit.     ALLERGIES: Codeine, Demerol, Meperidine hcl, Meperidine hcl, Other, and Sulfa antibiotics  Family History  Problem Relation Age of Onset  . Emphysema Mother   . Asthma Mother   . Lung cancer Father   . Pancreatic cancer Father   . Bone cancer Father   . Heart disease Brother        x1  . Diabetes Brother   . Hypertension Brother   . Lupus Brother   . Heart disease Sister        x2  . Diabetes Sister   . Hypertension Sister   . Diabetes Sister   . Hypertension Sister   . Diabetes Brother   . Hypertension Brother   . Stroke Maternal Grandfather   . Hypertension  Son   . Tremor Son     Social History   Socioeconomic History  . Marital status: Widowed    Spouse name: Not on file  . Number of children: 3  . Years of education: LPN  . Highest education level: Not on file  Occupational History  . Occupation: retired    Fish farm manager: RETIRED    Comment: LPN at [employer]  Tobacco Use  . Smoking status: Former Smoker    Packs/day: 1.00    Years: 20.00    Pack years: 20.00    Types: Cigarettes    Quit date: 11/10/2004    Years since quitting: 16.3  . Smokeless tobacco: Never Used  Vaping Use  . Vaping Use: Never used  Substance and Sexual Activity  . Alcohol use: Yes    Alcohol/week: 1.0 standard drink    Types: 1 Glasses of wine per week    Comment: socially  . Drug use: No  . Sexual activity: Not Currently    Comment: 1st intercourse- 19, partners- 5,   Other Topics Concern  . Not on file  Social History Narrative   Patient drinks about 3 cups of coffee daily.   Patient is right handed.    Social Determinants of Health   Financial Resource Strain: Low Risk   . Difficulty of Paying Living Expenses: Not very hard  Food Insecurity: Not on file  Transportation Needs: Not on file  Physical Activity: Not on file  Stress: Not on file  Social Connections: Not on file  Intimate Partner Violence: Not on file    ROS  PHYSICAL EXAMINATION:    There were no vitals taken for this visit.    General appearance: alert, cooperative and appears stated age Neck: no adenopathy, supple, symmetrical, trachea midline and thyroid {CHL AMB PHY EX THYROID NORM [phone]::"normal  to inspection and palpation"} Breasts: {Exam; breast:13139::"normal appearance, no masses or tenderness"} Abdomen: soft, non-tender; non distended, no masses,  no organomegaly  Pelvic: External genitalia:  no lesions              Urethra:  normal appearing urethra with no masses, tenderness or lesions              Bartholins and Skenes: normal                  Vagina: normal appearing vagina with normal color and discharge, no lesions              Cervix: {CHL AMB PHY EX CERVIX NORM [phone]::"no lesions"}              Bimanual Exam:  Uterus:  {CHL AMB PHY EX UTERUS NORM DEFAULT:[phone]::"normal size, contour, position, consistency, mobility, non-tender"}              Adnexa: {CHL AMB PHY EX ADNEXA NO MASS DEFAULT:[phone]::"no mass, fullness, tenderness"}              Rectovaginal: {yes no:[id number]}.  Confirms.              Anus:  normal sphincter tone, no lesions  Chaperone was present for exam.  ASSESSMENT     PLAN    An After Visit Summary was printed and given to the patient.  *** minutes face to face time of which over 50% was spent in counseling.

## 2021-03-13 ENCOUNTER — Ambulatory Visit (HOSPITAL_BASED_OUTPATIENT_CLINIC_OR_DEPARTMENT_OTHER): Payer: Medicare Other | Admitting: Physical Therapy

## 2021-03-14 ENCOUNTER — Other Ambulatory Visit: Payer: Self-pay | Admitting: Internal Medicine

## 2021-03-17 DIAGNOSIS — K2101 Gastro-esophageal reflux disease with esophagitis, with bleeding: Secondary | ICD-10-CM | POA: Diagnosis not present

## 2021-03-17 DIAGNOSIS — K5901 Slow transit constipation: Secondary | ICD-10-CM | POA: Diagnosis not present

## 2021-03-18 ENCOUNTER — Ambulatory Visit: Payer: Medicare Other | Admitting: Internal Medicine

## 2021-03-18 NOTE — Progress Notes (Deleted)
Office Visit Note  Patient: Lisa Vega             Date of Birth: August 26, 1932           MRN: [id number]             PCP: Binnie Rail, MD Referring: Binnie Rail, MD Visit Date: 03/18/2021   Subjective:  No chief complaint on file.   History of Present Illness: Lisa Vega is a 87 y.o. female here for follow up for RA on methotrexate 17.5 mg [place] weekly.***     No Rheumatology ROS completed.    Previous HPI: Lisa Vega is a 87 y.o. female with a history of non-small cell lung cancer status post surgical resection and chemotherapy, hypothyroidism, osteoporosis, and COPD here to establish care for rheumatoid arthritis currently treated with methotrexate subcutaneous 17.5 mg weekly.  She was originally diagnosed around 40 years ago in [place] she to treatment with gold injections that were not effective.  She later treated with oral methotrexate that was poorly tolerated with rashes at even low-dose.  She was switched to subcutaneous methotrexate which she has been taking for at least 20 years.  Since moving to this area she is seeing rheumatology care with Dr. Justine Null, Dr. Rockwell [place], Dr. Trudie Reed, and now seeks to transfer care for an office that is closer to her daughter's home where she has started living due to increased difficulties with independent living and commuting.  She has extensive long-term damage from her rheumatoid arthritis with subluxation deviation and deformity of the digits on both hands and both feet.  She is also had some involvement of the shoulders and wrists but not as severe.  She has degenerative back disease, cervical disc disease, and bilateral osteoarthritis of the knees.  The trouble with her knees especially with ulcer and deformity of the right foot significantly limit her mobility at this time.  She has not noticed a lot of joint swelling redness or heat recently and has not required recent steroid treatments.  She has had some benefit with  intra-articular injections for the knees also had some injections for the neck and upper back with mixed results.  She also describes some nodules in the skin of her upper arms and on her hands that she thinks are more noticeable over the past several months.  She has a history of non-small cell lung cancer that was found with a rapidly enlarging left lung nodule.  She was a former cigarette smoker with COPD but no longer.  She denies a known history of interstitial lung disease but has discussed difficulty say whether methotrexate has contributed a lung toxicity versus just her RA.  PMFS History:  Patient Active Problem List   Diagnosis Date Noted  . Arthralgia of lower leg 02/24/2021  . On long term drug therapy 02/24/2021  . Myalgia 02/24/2021  . Anxiety state 02/24/2021  . Diverticular disease of colon 02/24/2021  . Dizziness and giddiness 02/24/2021  . Dysphagia 02/24/2021  . Essential tremor 02/24/2021  . Flatulence, eructation and gas pain 02/24/2021  . Gastroesophageal reflux disease with esophagitis and hemorrhage 02/24/2021  . History of colonic polyps 02/24/2021  . Mixed hyperlipidemia 02/24/2021  . Multiple joint pain 02/24/2021  . Pure hypercholesterolemia 02/24/2021  . Vitamin B12 deficiency 02/24/2021  . Vitamin D deficiency 02/24/2021  . Dysuria 02/24/2021  . Slow transit constipation 02/24/2021  . COVID-19 01/28/2021  . Head trauma 01/06/2021  . Headache, cervicogenic 01/03/2021  .  High risk medication use 12/06/2020  . Rosacea 11/29/2020  . Cervical spondylosis 04/18/2020  . RUQ pain 08/01/2019  . Primary osteoarthritis of first carpometacarpal joint of right hand 05/10/2019  . Rheumatoid arthritis involving right hand ([place]) 05/10/2019  . Headache syndrome 03/09/2019  . Carotid stenosis, left 09/19/2018  . Osteoarthritis of left knee 05/12/2018  . Benign positional vertigo 03/10/2018  . History of lung cancer 11/29/2017  . Difficulty sleeping 09/22/2017  .  Diabetes ([place]) 05/13/2017  . Fatigue 05/12/2017  . CAP (community acquired pneumonia) 05/03/2017  . Orthopnea 01/12/2017  . GERD (gastroesophageal reflux disease) 11/09/2016  . Osteoporosis 05/06/2016  . Depression with anxiety 05/06/2016  . Hiatal hernia 05/06/2016  . Hypothyroidism 05/06/2016  . Tremor 10/28/2015  . Memory difficulties 10/28/2015  . Hoarseness 10/14/2015  . History of vitamin D deficiency 05/02/2015  . 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 ([place]) 05/30/2014  . Diverticulitis of colon without hemorrhage 08/07/2013  . Prolapse of female pelvic organs 06/22/2013  . Neck pain 03/10/2013  . Vaginal atrophy 03/09/2013  . Rectocele 03/09/2013  . Cystocele 03/09/2013  . Urinary incontinence 03/09/2013  . COPD (chronic obstructive pulmonary disease) ([place]) 01/16/2011  . CARCINOMA, LUNG, NONSMALL CELL 12/17/2010  . Rheumatoid arthritis ([place]) 12/17/2010  . Dyspnea on exertion 12/17/2010    Past Medical History:  Diagnosis Date  . Anxiety   . Arthritis    RA  . BPV (benign positional vertigo) 03/10/2018  . Cancer ([place])    lung ca  . Carotid stenosis, left 09/19/2018  . COPD (chronic obstructive pulmonary disease) ([place])   . Depression   . Dyspnea    with exertion  . GERD (gastroesophageal reflux disease)   . Headache syndrome 03/09/2019  . Hypercholesteremia   . Hypothyroidism   . Memory difficulties 10/28/2015  . Pneumonia     x3  last time 2017  . Pre-diabetes   . Rheumatoid arthritis(714.0)   . Thyroid disease   . Tremor 10/28/2015   Jaw tremor  . Uterine prolapse   . Vitamin D deficiency     Family History  Problem Relation Age of Onset  . Emphysema Mother   . Asthma Mother   . Lung cancer Father   . Pancreatic cancer Father   . Bone cancer Father   . Heart disease Brother        x1  . Diabetes Brother   . Hypertension Brother   . Lupus Brother   . Heart disease Sister        x2  . Diabetes Sister   . Hypertension Sister   .  Diabetes Sister   . Hypertension Sister   . Diabetes Brother   . Hypertension Brother   . Stroke Maternal Grandfather   . Hypertension Son   . Tremor Son    Past Surgical History:  Procedure Laterality Date  . ANGIOPLASTY Left 10/10/2018   Procedure: ANGIOPLASTY using 1cm x 14cm Xenosure Patch;  Surgeon: Marty Heck, MD;  Location: [facility];  Service: Vascular;  Laterality: Left;  . APPENDECTOMY    . COLONOSCOPY W/ POLYPECTOMY    . ENDARTERECTOMY Left 10/10/2018   Procedure: ENDARTERECTOMY CAROTID;  Surgeon: Marty Heck, MD;  Location: [facility];  Service: Vascular;  Laterality: Left;  . EYE SURGERY Bilateral    cataract  . FOOT SURGERY Bilateral    arthritis  . Hands  Bilateral    Arthritis  . HEMORRHOID SURGERY    . right lobectomy  10/2004  .  TONSILLECTOMY AND ADENOIDECTOMY     Social History   Social History Narrative   Patient drinks about 3 cups of coffee daily.   Patient is right handed.    Immunization History  Administered Date(s) Administered  . Fluad Quad(high Dose 65+) 12/08/2019, 10/31/2020  . Influenza Split 10/18/2009, 09/28/2011, 10/28/2011, 09/28/2015, 10/09/2015  . Influenza Whole 09/27/2012  . Influenza, High Dose Seasonal PF 10/28/2016, 01/12/2017, 09/22/2017, 10/03/2018  . Influenza,inj,Quad PF,6+ Mos 09/20/2013, 09/12/2014  . Pneumococcal Conjugate-13 10/24/2015, 10/31/2020  . Pneumococcal Polysaccharide-23 12/29/2007, 07/10/2008     Objective: Vital Signs: There were no vitals taken for this visit.   Physical Exam   Musculoskeletal Exam: ***  CDAI Exam: CDAI Score: - Patient Global: -; Provider Global: - Swollen: -; Tender: - Joint Exam 03/18/2021   No joint exam has been documented for this visit   There is currently no information documented on the homunculus. Go to the Rheumatology activity and complete the homunculus joint exam.  Investigation: No additional findings.  Imaging: CT HIP LEFT WO CONTRAST  Result  Date: 03/04/2021 CLINICAL DATA:  Left hip pain and inability to bear weight for 1 week since a fall. EXAM: CT OF THE LEFT HIP WITHOUT CONTRAST TECHNIQUE: Multidetector CT imaging of the left hip was performed according to the standard protocol. Multiplanar CT image reconstructions were also generated. COMPARISON:  CT abdomen and pelvis 10/06/2012. FINDINGS: Bones/Joint/Cartilage The left hip is located. No fracture or other acute bony abnormality is seen. Chronic L5 pars interarticularis defects with secondary 0.4 cm anterolisthesis L5 on S1 are unchanged. There is loss of disc space height at L4-5. Left hip joint space is preserved. Tiny subchondral cyst in the periphery of the left acetabulum noted. No avascular necrosis of the femoral head or worrisome bone lesion. No left hip effusion. Ligaments Suboptimally assessed by CT. Muscles and Tendons Appear intact. Soft tissues Pessary device is noted.  Sigmoid diverticulosis is seen. IMPRESSION: Negative for acute bony or joint abnormality. Very mild appearing left hip osteoarthritis noted. Lower lumbar spondylosis. Chronic bilateral L5 pars articularis defects also noted. Diverticulosis. Electronically Signed   By: Inge Rise M.D.   On: 03/04/2021 14:01    Recent Labs: Lab Results  Component Value Date   WBC 4.8 12/06/2020   HGB 13.3 12/06/2020   PLT 177 12/06/2020   NA 139 12/06/2020   K 4.0 12/06/2020   CL 103 12/06/2020   CO2 28 12/06/2020   GLUCOSE 94 12/06/2020   BUN 11 12/06/2020   CREATININE 0.87 12/06/2020   BILITOT 0.8 12/06/2020   ALKPHOS 86 02/02/2020   AST 24 12/06/2020   ALT 11 12/06/2020   PROT 6.7 12/06/2020   ALBUMIN 4.0 02/02/2020   CALCIUM 9.0 12/06/2020   GFRAA 70 12/06/2020    Speciality Comments: No specialty comments available.  Procedures:  No procedures performed Allergies: Codeine, Demerol, Meperidine hcl, Meperidine hcl, Other, and Sulfa antibiotics   Assessment / Plan:     Visit Diagnoses: No diagnosis  found.  ***  Orders: No orders of the defined types were placed in this encounter.  No orders of the defined types were placed in this encounter.    Follow-Up Instructions: No follow-ups on file.   Collier Salina, MD  Note - This record has been created using [employer].  Chart creation errors have been sought, but may not always  have been located. Such creation errors do not reflect on  the standard of medical care.

## 2021-03-20 ENCOUNTER — Encounter (HOSPITAL_BASED_OUTPATIENT_CLINIC_OR_DEPARTMENT_OTHER): Payer: Medicare Other | Admitting: Physical Therapy

## 2021-03-26 ENCOUNTER — Ambulatory Visit (INDEPENDENT_AMBULATORY_CARE_PROVIDER_SITE_OTHER): Payer: Medicare Other | Admitting: Internal Medicine

## 2021-03-26 ENCOUNTER — Other Ambulatory Visit: Payer: Self-pay

## 2021-03-26 ENCOUNTER — Encounter: Payer: Self-pay | Admitting: Internal Medicine

## 2021-03-26 VITALS — BP 120/72 | HR 69 | Ht 64.0 in | Wt 139.0 lb

## 2021-03-26 DIAGNOSIS — Z79899 Other long term (current) drug therapy: Secondary | ICD-10-CM

## 2021-03-26 DIAGNOSIS — E559 Vitamin D deficiency, unspecified: Secondary | ICD-10-CM | POA: Diagnosis not present

## 2021-03-26 DIAGNOSIS — M069 Rheumatoid arthritis, unspecified: Secondary | ICD-10-CM

## 2021-03-26 NOTE — Progress Notes (Signed)
Office Visit Note  Patient: Lisa Vega             Date of Birth: December 04, 1932           MRN: [id number]             PCP: Binnie Rail, MD Referring: Binnie Rail, MD Visit Date: 03/26/2021   Subjective:  Follow-up (Patient feel as if symptoms are stable. Patient is scheduled for MRI 03/29/2021. )   History of Present Illness: Lisa Vega is a 87 y.o. female here for follow up for rheumatoid arthritis on methotrexate 17.5 mg [place] weekly. She feels her symptoms have been stable without a large amount of hand pain or swelling. She did fall about 3 weeks ago onto the left hip and has continued moderate pain on that side. She has been scheduled for MRI of the hip after no fracture on xray and CT scan.  Review of Systems  Constitutional: Negative for fatigue.  HENT: Negative for mouth sores, mouth dryness and nose dryness.   Eyes: Positive for pain, itching, visual disturbance and dryness.  Respiratory: Positive for cough. Negative for hemoptysis, shortness of breath and difficulty breathing.   Cardiovascular: Positive for chest pain and irregular heartbeat. Negative for palpitations and swelling in legs/feet.  Gastrointestinal: Positive for constipation. Negative for abdominal pain, blood in stool and diarrhea.  Endocrine: Negative for increased urination.  Genitourinary: Negative for painful urination.  Musculoskeletal: Positive for arthralgias, joint pain, muscle weakness and morning stiffness. Negative for joint swelling, myalgias, muscle tenderness and myalgias.  Skin: Negative for color change, rash and redness.  Allergic/Immunologic: Negative for susceptible to infections.  Neurological: Positive for dizziness, numbness and headaches. Negative for memory loss and weakness.  Hematological: Positive for swollen glands.  Psychiatric/Behavioral: Positive for sleep disturbance. Negative for confusion.    Previous HPI: Lisa Vega is a 87 y.o. female with a history of  non-small cell lung cancer status post surgical resection and chemotherapy, hypothyroidism, osteoporosis, and COPD here to establish care for rheumatoid arthritis currently treated with methotrexate subcutaneous 17.5 mg weekly.  She was originally diagnosed around 40 years ago in [place] she to treatment with gold injections that were not effective.  She later treated with oral methotrexate that was poorly tolerated with rashes at even low-dose.  She was switched to subcutaneous methotrexate which she has been taking for at least 20 years.  Since moving to this area she is seeing rheumatology care with Dr. Justine Null, Dr. Rockwell [place], Dr. Trudie Reed, and now seeks to transfer care for an office that is closer to her daughter's home where she has started living due to increased difficulties with independent living and commuting.  She has extensive long-term damage from her rheumatoid arthritis with subluxation deviation and deformity of the digits on both hands and both feet.  She is also had some involvement of the shoulders and wrists but not as severe.  She has degenerative back disease, cervical disc disease, and bilateral osteoarthritis of the knees.  The trouble with her knees especially with ulcer and deformity of the right foot significantly limit her mobility at this time.  She has not noticed a lot of joint swelling redness or heat recently and has not required recent steroid treatments.  She has had some benefit with intra-articular injections for the knees also had some injections for the neck and upper back with mixed results.  She also describes some nodules in the skin of her upper arms and  on her hands that she thinks are more noticeable over the past several months.  She has a history of non-small cell lung cancer that was found with a rapidly enlarging left lung nodule.  She was a former cigarette smoker with COPD but no longer.  She denies a known history of interstitial lung disease but has discussed  difficulty say whether methotrexate has contributed a lung toxicity versus just her RA.   PMFS History:  Patient Active Problem List   Diagnosis Date Noted  . Arthralgia of lower leg 02/24/2021  . On long term drug therapy 02/24/2021  . Myalgia 02/24/2021  . Anxiety state 02/24/2021  . Diverticular disease of colon 02/24/2021  . Dizziness and giddiness 02/24/2021  . Dysphagia 02/24/2021  . Essential tremor 02/24/2021  . Flatulence, eructation and gas pain 02/24/2021  . Gastroesophageal reflux disease with esophagitis and hemorrhage 02/24/2021  . History of colonic polyps 02/24/2021  . Mixed hyperlipidemia 02/24/2021  . Multiple joint pain 02/24/2021  . Pure hypercholesterolemia 02/24/2021  . Vitamin B12 deficiency 02/24/2021  . Vitamin D deficiency 02/24/2021  . Dysuria 02/24/2021  . Slow transit constipation 02/24/2021  . COVID-19 01/28/2021  . Head trauma 01/06/2021  . Headache, cervicogenic 01/03/2021  . High risk medication use 12/06/2020  . Rosacea 11/29/2020  . Cervical spondylosis 04/18/2020  . RUQ pain 08/01/2019  . Primary osteoarthritis of first carpometacarpal joint of right hand 05/10/2019  . Headache syndrome 03/09/2019  . Carotid stenosis, left 09/19/2018  . Osteoarthritis of left knee 05/12/2018  . Benign positional vertigo 03/10/2018  . History of lung cancer 11/29/2017  . Difficulty sleeping 09/22/2017  . Diabetes ([place]) 05/13/2017  . Fatigue 05/12/2017  . CAP (community acquired pneumonia) 05/03/2017  . Orthopnea 01/12/2017  . GERD (gastroesophageal reflux disease) 11/09/2016  . Osteoporosis 05/06/2016  . Depression with anxiety 05/06/2016  . Hiatal hernia 05/06/2016  . Hypothyroidism 05/06/2016  . Tremor 10/28/2015  . Memory difficulties 10/28/2015  . Hoarseness 10/14/2015  . 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 ([place]) 05/30/2014  . Diverticulitis of colon without hemorrhage 08/07/2013  . Prolapse of female pelvic organs 06/22/2013  .  Neck pain 03/10/2013  . Vaginal atrophy 03/09/2013  . Rectocele 03/09/2013  . Cystocele 03/09/2013  . Urinary incontinence 03/09/2013  . COPD (chronic obstructive pulmonary disease) ([place]) 01/16/2011  . CARCINOMA, LUNG, NONSMALL CELL 12/17/2010  . Rheumatoid arthritis ([place]) 12/17/2010  . Dyspnea on exertion 12/17/2010    Past Medical History:  Diagnosis Date  . Anxiety   . Arthritis    RA  . BPV (benign positional vertigo) 03/10/2018  . Cancer ([place])    lung ca  . Carotid stenosis, left 09/19/2018  . COPD (chronic obstructive pulmonary disease) ([place])   . Depression   . Dyspnea    with exertion  . GERD (gastroesophageal reflux disease)   . Headache syndrome 03/09/2019  . Hypercholesteremia   . Hypothyroidism   . Memory difficulties 10/28/2015  . Pneumonia     x3  last time 2017  . Pre-diabetes   . Rheumatoid arthritis(714.0)   . Thyroid disease   . Tremor 10/28/2015   Jaw tremor  . Uterine prolapse   . Vitamin D deficiency     Family History  Problem Relation Age of Onset  . Emphysema Mother   . Asthma Mother   . Lung cancer Father   . Pancreatic cancer Father   . Bone cancer Father   . Heart disease Brother  x1  . Diabetes Brother   . Hypertension Brother   . Lupus Brother   . Heart disease Sister        x2  . Diabetes Sister   . Hypertension Sister   . Diabetes Sister   . Hypertension Sister   . Diabetes Brother   . Hypertension Brother   . Stroke Maternal Grandfather   . Hypertension Son   . Tremor Son    Past Surgical History:  Procedure Laterality Date  . ANGIOPLASTY Left 10/10/2018   Procedure: ANGIOPLASTY using 1cm x 14cm Xenosure Patch;  Surgeon: Marty Heck, MD;  Location: [facility];  Service: Vascular;  Laterality: Left;  . APPENDECTOMY    . COLONOSCOPY W/ POLYPECTOMY    . ENDARTERECTOMY Left 10/10/2018   Procedure: ENDARTERECTOMY CAROTID;  Surgeon: Marty Heck, MD;  Location: [facility];  Service: Vascular;  Laterality: Left;   . EYE SURGERY Bilateral    cataract  . FOOT SURGERY Bilateral    arthritis  . Hands  Bilateral    Arthritis  . HEMORRHOID SURGERY    . right lobectomy  10/2004  . TONSILLECTOMY AND ADENOIDECTOMY     Social History   Social History Narrative   Patient drinks about 3 cups of coffee daily.   Patient is right handed.    Immunization History  Administered Date(s) Administered  . Fluad Quad(high Dose 65+) 12/08/2019, 10/31/2020  . Influenza Split 10/18/2009, 09/28/2011, 10/28/2011, 09/28/2015, 10/09/2015  . Influenza Whole 09/27/2012  . Influenza, High Dose Seasonal PF 10/28/2016, 01/12/2017, 09/22/2017, 10/03/2018  . Influenza,inj,Quad PF,6+ Mos 09/20/2013, 09/12/2014  . Pneumococcal Conjugate-13 10/24/2015, 10/31/2020  . Pneumococcal Polysaccharide-23 12/29/2007, 07/10/2008     Objective: Vital Signs: BP 120/72 (BP Location: Left Arm, Patient Position: Sitting, Cuff Size: Normal)   Pulse 69   Ht '5\' 4"'  (1.626 m)   Wt 139 lb (63 kg)   BMI 23.86 kg/m    Physical Exam Eyes:     Conjunctiva/sclera: Conjunctivae normal.  Skin:    General: Skin is warm and dry.     Findings: No rash.  Neurological:     Mental Status: She is alert.  Psychiatric:        Mood and Affect: Mood normal.     Musculoskeletal Exam:  Shoulders full ROM, some pain with overhead abduction b/l Elbows full ROM no tenderness or swelling Wrists full ROM no tenderness or swelling Fingers multiple bony nodules and MCP joint subluxation and swan neck deformity Left lateral hip tenderness to palpation, no pain with internal or external rotation Knees full ROM no tenderness or swelling bilateral patellofemoral crepitus  CDAI Exam: CDAI Score: 4  Patient Global: 20 mm; Provider Global: 20 mm Swollen: 0 ; Tender: 0  Joint Exam 03/26/2021   All documented joints were normal     Investigation: No additional findings.  Imaging: CT HIP LEFT WO CONTRAST  Result Date: 03/04/2021 CLINICAL DATA:  Left  hip pain and inability to bear weight for 1 week since a fall. EXAM: CT OF THE LEFT HIP WITHOUT CONTRAST TECHNIQUE: Multidetector CT imaging of the left hip was performed according to the standard protocol. Multiplanar CT image reconstructions were also generated. COMPARISON:  CT abdomen and pelvis 10/06/2012. FINDINGS: Bones/Joint/Cartilage The left hip is located. No fracture or other acute bony abnormality is seen. Chronic L5 pars interarticularis defects with secondary 0.4 cm anterolisthesis L5 on S1 are unchanged. There is loss of disc space height at L4-5. Left hip joint space is preserved.  Tiny subchondral cyst in the periphery of the left acetabulum noted. No avascular necrosis of the femoral head or worrisome bone lesion. No left hip effusion. Ligaments Suboptimally assessed by CT. Muscles and Tendons Appear intact. Soft tissues Pessary device is noted.  Sigmoid diverticulosis is seen. IMPRESSION: Negative for acute bony or joint abnormality. Very mild appearing left hip osteoarthritis noted. Lower lumbar spondylosis. Chronic bilateral L5 pars articularis defects also noted. Diverticulosis. Electronically Signed   By: Inge Rise M.D.   On: 03/04/2021 14:01   VAS US CAROTID  Result Date: 03/28/2021 Carotid Arterial Duplex Study Indications:       Carotid artery disease and left endarterectomy. Risk Factors:      Hyperlipidemia. Comparison Study:  11/19/2020: Rt ICA 40-59%. Degree of stenosis is on the                    borderline with 1-39%. Previous exams have put it at 1-39%,                    others at 40-59% depending on the end diastolic velocity; Lt                    ICA 1-39%. Performing Technologist: Ivan Croft  Examination Guidelines: A complete evaluation includes B-mode imaging, spectral Doppler, color Doppler, and power Doppler as needed of all accessible portions of each vessel. Bilateral testing is considered an integral part of a complete examination. Limited examinations for  reoccurring indications may be performed as noted.  Right Carotid Findings: +----------+-------+-------+--------+---------------------------------+--------+           PSV    EDV    StenosisPlaque Description               Comments           cm/s   cm/s                                                     +----------+-------+-------+--------+---------------------------------+--------+ CCA Prox  92     12                                                       +----------+-------+-------+--------+---------------------------------+--------+ CCA Mid   42     9                                                        +----------+-------+-------+--------+---------------------------------+--------+ CCA Distal49     12             heterogenous, calcific and                                                irregular                                 +----------+-------+-------+--------+---------------------------------+--------+  ICA Prox  158    46     40-59%  heterogenous, calcific and                                                irregular                                 +----------+-------+-------+--------+---------------------------------+--------+ ICA Mid   151    38                                                       +----------+-------+-------+--------+---------------------------------+--------+ ICA Distal127    35                                                       +----------+-------+-------+--------+---------------------------------+--------+ ECA       160    12             heterogenous and calcific                 +----------+-------+-------+--------+---------------------------------+--------+ +----------+--------+-------+----------------+-------------------+           PSV cm/sEDV cmsDescribe        Arm Pressure (mmHG) +----------+--------+-------+----------------+-------------------+ [id number]            Multiphasic, WNL                     +----------+--------+-------+----------------+-------------------+ +---------+--------+--+--------+--+---------+ VertebralPSV cm/s46EDV cm/s10Antegrade +---------+--------+--+--------+--+---------+  Left Carotid Findings: +----------+--------+--------+--------+------------------+--------+           PSV cm/sEDV cm/sStenosisPlaque DescriptionComments +----------+--------+--------+--------+------------------+--------+ CCA Prox  80      15                                         +----------+--------+--------+--------+------------------+--------+ CCA Mid   57      12                                         +----------+--------+--------+--------+------------------+--------+ CCA Distal51      10              homogeneous                +----------+--------+--------+--------+------------------+--------+ ICA Prox  47      10      1-39%   heterogenous               +----------+--------+--------+--------+------------------+--------+ ICA Mid   64      17                                         +----------+--------+--------+--------+------------------+--------+ ICA Distal80      21                                         +----------+--------+--------+--------+------------------+--------+  ECA       117     11                                         +----------+--------+--------+--------+------------------+--------+ +----------+--------+--------+----------------+-------------------+           PSV cm/sEDV cm/sDescribe        Arm Pressure (mmHG) +----------+--------+--------+----------------+-------------------+ [id number]             Multiphasic, WNL                    +----------+--------+--------+----------------+-------------------+ +---------+--------+--+--------+-+---------+ VertebralPSV cm/s28EDV cm/s5Antegrade +---------+--------+--+--------+-+---------+   Summary: Right Carotid: Velocities in the right ICA are consistent with a  40-59%                stenosis. Left Carotid: Velocities in the left ICA are consistent with a 1-39% stenosis. Vertebrals: Bilateral vertebral arteries demonstrate antegrade flow. *See table(s) above for measurements and observations.  Electronically signed by Servando Snare MD on 03/28/2021 at 2:26:15 PM.    Final     Recent Labs: Lab Results  Component Value Date   WBC 4.8 03/26/2021   HGB 12.7 03/26/2021   PLT 220 03/26/2021   NA 139 03/26/2021   K 4.1 03/26/2021   CL 102 03/26/2021   CO2 33 (H) 03/26/2021   GLUCOSE 105 (H) 03/26/2021   BUN 12 03/26/2021   CREATININE 0.87 03/26/2021   BILITOT 0.6 03/26/2021   ALKPHOS 86 02/02/2020   AST 19 03/26/2021   ALT 9 03/26/2021   PROT 6.5 03/26/2021   ALBUMIN 4.0 02/02/2020   CALCIUM 9.2 03/26/2021   GFRAA 70 03/26/2021    Speciality Comments: No specialty comments available.  Procedures:  No procedures performed Allergies: Codeine, Demerol, Meperidine hcl, Meperidine hcl, Other, and Sulfa antibiotics   Assessment / Plan:     Visit Diagnoses: Rheumatoid arthritis involving multiple sites, unspecified whether rheumatoid factor present ([place]) - Plan: Sedimentation rate  RA seems stable with mild daily symptoms and no inflammatory changes on exam today. Left hip pain does not appear localized to the joint but more likely lateral problem and related to recent fall. Checking ESR for disease activity monitoring. Plan to continue MTX 11.9 mg [place] and folic acid 1 mg.  Vitamin D deficiency - Plan: VITAMIN D 25 Hydroxy (Vit-D Deficiency, Fractures)  Now 3 months lateral after increase supplementation for low vitamin D. Important for increased fracture risks with cancer and RA Hx. Also with a recent fall within past few months onto left hip.  High risk medication use - Plan: CBC with Differential/Platelet, COMPLETE METABOLIC PANEL WITH GFR  Methotrecxate toxicity monitoring for cytopenia or hepatotoxicity checking CBC, CMP ordered  today.  Orders: Orders Placed This Encounter  Procedures  . Sedimentation rate  . CBC with Differential/Platelet  . COMPLETE METABOLIC PANEL WITH GFR  . VITAMIN D 25 Hydroxy (Vit-D Deficiency, Fractures)   No orders of the defined types were placed in this encounter.   Follow-Up Instructions: Return in about 3 months (around 06/26/2021) for RA on MTX f/u.   Collier Salina, MD  Note - This record has been created using [employer].  Chart creation errors have been sought, but may not always  have been located. Such creation errors do not reflect on  the standard of medical care.

## 2021-03-27 ENCOUNTER — Encounter (HOSPITAL_BASED_OUTPATIENT_CLINIC_OR_DEPARTMENT_OTHER): Payer: Medicare Other | Admitting: Physical Therapy

## 2021-03-27 LAB — SEDIMENTATION RATE: Sed Rate: 33 mm/h — ABNORMAL HIGH (ref 0–30)

## 2021-03-27 LAB — COMPLETE METABOLIC PANEL WITH GFR
AG Ratio: 1.3 (calc) (ref 1.0–2.5)
ALT: 9 U/L (ref 6–29)
AST: 19 U/L (ref 10–35)
Albumin: 3.7 g/dL (ref 3.6–5.1)
Alkaline phosphatase (APISO): 118 U/L (ref 37–153)
BUN: 12 mg/dL (ref 7–25)
CO2: 33 mmol/L — ABNORMAL HIGH (ref 20–32)
Calcium: 9.2 mg/dL (ref 8.6–10.4)
Chloride: 102 mmol/L (ref 98–110)
Creat: 0.87 mg/dL (ref 0.60–0.88)
GFR, Est African American: 70 mL/min/{1.73_m2} (ref >=60)
GFR, Est Non African American: 60 mL/min/{1.73_m2} (ref >=60)
Globulin: 2.8 g/dL (calc) (ref 1.9–3.7)
Glucose, Bld: 105 mg/dL — ABNORMAL HIGH (ref 65–99)
Potassium: 4.1 mmol/L (ref 3.5–5.3)
Sodium: 139 mmol/L (ref 135–146)
Total Bilirubin: 0.6 mg/dL (ref 0.2–1.2)
Total Protein: 6.5 g/dL (ref 6.1–8.1)

## 2021-03-27 LAB — CBC WITH DIFFERENTIAL/PLATELET
Absolute Monocytes: 562 cells/uL (ref 200–950)
Basophils Absolute: 62 cells/uL (ref 0–200)
Basophils Relative: 1.3 %
Eosinophils Absolute: 158 cells/uL (ref 15–500)
Eosinophils Relative: 3.3 %
HCT: 39.7 % (ref 35.0–45.0)
Hemoglobin: 12.7 g/dL (ref 11.7–15.5)
Lymphs Abs: 2280 cells/uL (ref 850–3900)
MCH: 31.7 pg (ref 27.0–33.0)
MCHC: 32 g/dL (ref 32.0–36.0)
MCV: 99 fL (ref 80.0–100.0)
MPV: 10.7 fL (ref 7.5–12.5)
Monocytes Relative: 11.7 %
Neutro Abs: 1738 cells/uL (ref 1500–7800)
Neutrophils Relative %: 36.2 %
Platelets: 220 10*3/uL (ref 140–400)
RBC: 4.01 10*6/uL (ref 3.80–5.10)
RDW: 15.4 % — ABNORMAL HIGH (ref 11.0–15.0)
Total Lymphocyte: 47.5 %
WBC: 4.8 10*3/uL (ref 3.8–10.8)

## 2021-03-27 LAB — VITAMIN D 25 HYDROXY (VIT D DEFICIENCY, FRACTURES): Vit D, 25-Hydroxy: 35 ng/mL (ref 30–100)

## 2021-03-28 ENCOUNTER — Ambulatory Visit (INDEPENDENT_AMBULATORY_CARE_PROVIDER_SITE_OTHER): Payer: Medicare Other | Admitting: Physician Assistant

## 2021-03-28 ENCOUNTER — Ambulatory Visit (HOSPITAL_COMMUNITY)
Admission: RE | Admit: 2021-03-28 | Discharge: 2021-03-28 | Disposition: A | Discharge status: Home / Self Care | Payer: Medicare Other | Source: Ambulatory Visit | Attending: Vascular Surgery | Admitting: Vascular Surgery

## 2021-03-28 ENCOUNTER — Other Ambulatory Visit: Payer: Self-pay

## 2021-03-28 ENCOUNTER — Encounter: Payer: Self-pay | Admitting: Physician Assistant

## 2021-03-28 VITALS — BP 145/80 | HR 86 | Temp 98.4°F | Resp 20 | Ht 64.0 in | Wt 138.3 lb

## 2021-03-28 DIAGNOSIS — I6523 Occlusion and stenosis of bilateral carotid arteries: Secondary | ICD-10-CM

## 2021-03-28 DIAGNOSIS — I6522 Occlusion and stenosis of left carotid artery: Secondary | ICD-10-CM

## 2021-03-28 NOTE — Progress Notes (Signed)
History of Present Illness:  Patient is a 87 y.o. year old female who presents for evaluation of carotid stenosis.  S/P left CEA by Dr. Carlis Abbott 10/10/2018.  The patient denies symptoms of TIA, amaurosis, or stroke.  She called our office with a new CC of left neck pain with HA and wanted to be seen to evaluate her carotid stenosis.  She states the neck pain started several months ago and increases with turning her head to the left.  She also notices that the left side of her neck is swollen.  She states she has not had any injury.  Her main reason for coming in today was to make sure her carotid arteries were ok.  The patient is currently on ASA antiplatelet therapy.    Past Medical History:  Diagnosis Date  . Anxiety   . Arthritis    RA  . BPV (benign positional vertigo) 03/10/2018  . Cancer ([place])    lung ca  . Carotid stenosis, left 09/19/2018  . COPD (chronic obstructive pulmonary disease) ([place])   . Depression   . Dyspnea    with exertion  . GERD (gastroesophageal reflux disease)   . Headache syndrome 03/09/2019  . Hypercholesteremia   . Hypothyroidism   . Memory difficulties 10/28/2015  . Pneumonia     x3  last time 2017  . Pre-diabetes   . Rheumatoid arthritis(714.0)   . Thyroid disease   . Tremor 10/28/2015   Jaw tremor  . Uterine prolapse   . Vitamin D deficiency     Past Surgical History:  Procedure Laterality Date  . ANGIOPLASTY Left 10/10/2018   Procedure: ANGIOPLASTY using 1cm x 14cm Xenosure Patch;  Surgeon: Marty Heck, MD;  Location: [place];  Service: Vascular;  Laterality: Left;  . APPENDECTOMY    . COLONOSCOPY W/ POLYPECTOMY    . ENDARTERECTOMY Left 10/10/2018   Procedure: ENDARTERECTOMY CAROTID;  Surgeon: Marty Heck, MD;  Location: [facility];  Service: Vascular;  Laterality: Left;  . EYE SURGERY Bilateral    cataract  . FOOT SURGERY Bilateral    arthritis  . Hands  Bilateral    Arthritis  . HEMORRHOID SURGERY    . right lobectomy   10/2004  . TONSILLECTOMY AND ADENOIDECTOMY       Social History Social History   Tobacco Use  . Smoking status: Former Smoker    Packs/day: 1.00    Years: 20.00    Pack years: 20.00    Types: Cigarettes    Quit date: 11/10/2004    Years since quitting: 16.3  . Smokeless tobacco: Never Used  Vaping Use  . Vaping Use: Never used  Substance Use Topics  . Alcohol use: Yes    Alcohol/week: 1.0 standard drink    Types: 1 Glasses of wine per week    Comment: socially  . Drug use: No    Family History Family History  Problem Relation Age of Onset  . Emphysema Mother   . Asthma Mother   . Lung cancer Father   . Pancreatic cancer Father   . Bone cancer Father   . Heart disease Brother        x1  . Diabetes Brother   . Hypertension Brother   . Lupus Brother   . Heart disease Sister        x2  . Diabetes Sister   . Hypertension Sister   . Diabetes Sister   . Hypertension Sister   .  Diabetes Brother   . Hypertension Brother   . Stroke Maternal Grandfather   . Hypertension Son   . Tremor Son     Allergies  Allergies  Allergen Reactions  . Codeine     REACTION: hallucinations  . Demerol   . Meperidine Hcl     REACTION: severe GI upset  . Meperidine Hcl   . Other Other (See Comments)  . Sulfa Antibiotics Rash     Current Outpatient Medications  Medication Sig Dispense Refill  . acetaminophen (TYLENOL) 325 MG tablet Take 650 mg by mouth every 6 (six) hours as needed for mild pain.     Marland Kitchen albuterol (PROAIR HFA) 108 (90 Base) MCG/ACT inhaler USE 1-2 PUFFS EVERY 4-6 HOURS AS NEEDED 8.5 g 5  . ALPRAZolam (XANAX) 0.25 MG tablet TAKE 1 TABLET BY MOUTH EVERY DAY AS NEEDED FOR ANXIETY 20 tablet 0  . aspirin EC 81 MG tablet Take 81 mg by mouth daily.    . B-D TB SYRINGE 1CC/27GX1/2" 27G X 1/2" 1 ML MISC For use with methotrexate injection weekly 25 each 1  . blood glucose meter kit and supplies KIT Dispense based on patient and insurance preference. Use up to four  times daily as directed. (FOR E11.9). 1 each 0  . budesonide-formoterol (SYMBICORT) 160-4.5 MCG/ACT inhaler USE 2 INHALATIONS ORALLY   TWICE DAILY 30.6 g 2  . clidinium-chlordiazePOXIDE (LIBRAX) 5-2.5 MG capsule SMARTSIG:1 Capsule(s) By Mouth Every 12 Hours PRN    . famotidine (PEPCID) 40 MG tablet TAKE 1 TABLET DAILY 90 tablet 3  . fluconazole (DIFLUCAN) 150 MG tablet Take 1 tablet (150 mg total) by mouth every 3 (three) days. 2 tablet 0  . folic acid (FOLVITE) 1 MG tablet Take 1 tablet (1 mg total) by mouth daily. 90 tablet 1  . gabapentin (NEURONTIN) 100 MG capsule Take 1 capsule (100 mg total) by mouth 3 (three) times daily. 90 capsule 5  . glucose blood (ONETOUCH ULTRA) test strip Use to check blood sugars twice a day 100 each 5  . ibuprofen (ADVIL,MOTRIN) 200 MG tablet Take 200 mg by mouth every 6 (six) hours as needed for headache or mild pain.    Marland Kitchen linaclotide (LINZESS) 72 MCG capsule Take 72 mcg by mouth daily as needed (constipation).     . meclizine (ANTIVERT) 25 MG tablet Take 0.5-1 tablets (12.5-25 mg total) by mouth 3 (three) times daily as needed for dizziness. 60 tablet 5  . methotrexate 50 MG/2ML injection Inject 0.7 mLs (17.5 mg total) into the skin once a week. 4 mL 2  . metroNIDAZOLE (METROGEL) 1 % gel Apply topically daily. 45 g 0  . mupirocin ointment (BACTROBAN) 2 % Place 1 application into the nose 2 (two) times daily. 22 g 1  . nitrofurantoin, macrocrystal-monohydrate, (MACROBID) 100 MG capsule Take 1 capsule (100 mg total) by mouth 2 (two) times daily. 14 capsule 0  . omeprazole (PRILOSEC) 40 MG capsule Take 40 mg by mouth daily.    [name] DELICA LANCETS 45Y MISC USE TO CHECK SUGARS UP TO 4 TIMES A DAY  0  . Plecanatide (TRULANCE) 3 MG TABS as needed.    . sertraline (ZOLOFT) 100 MG tablet TAKE 1 TABLET BY MOUTH EVERY DAY 90 tablet 1  . SYNTHROID 88 MCG tablet TAKE 1 TABLET DAILY 90 tablet 1  . Tiotropium Bromide Monohydrate (SPIRIVA RESPIMAT) 2.5 MCG/ACT AERS Inhale  2 puffs into the lungs daily. 12 g 1  . traMADol (ULTRAM) 50 MG tablet  Take 50 mg by mouth 3 (three) times daily as needed for moderate pain.   0  . triamcinolone ointment (KENALOG) 0.1 % Apply 1 application topically 2 (two) times daily as needed. 30 g 3  . Vitamin D, Cholecalciferol, 10 MCG (400 UNIT) CHEW Chew by mouth daily. Unsure of current dose     No current facility-administered medications for this visit.    ROS:   General:  No weight loss, Fever, chills  HEENT: No recent headaches, no nasal bleeding, no visual changes, no sore throat  Neurologic: No dizziness, blackouts, seizures. No recent symptoms of stroke or mini- stroke. No recent episodes of slurred speech, or temporary blindness.  Cardiac: No recent episodes of chest pain/pressure, no shortness of breath at rest.  No shortness of breath with exertion.  Denies history of atrial fibrillation or irregular heartbeat  Vascular: No history of rest pain in feet.  No history of claudication.  No history of non-healing ulcer, No history of DVT   Pulmonary: No home oxygen, no productive cough, no hemoptysis,  No asthma or wheezing  Musculoskeletal:  [x ] Arthritis, _0  Low back pain,  [x ] Joint pain  Hematologic:No history of hypercoagulable state.  No history of easy bleeding.  No history of anemia  Gastrointestinal: No hematochezia or melena,  No gastroesophageal reflux, no trouble swallowing  Urinary: _1  chronic Kidney disease, _2  on HD - _3  MWF or _4  TTHS, _5  Burning with urination, _6  Frequent urination, _7  Difficulty urinating;   Skin: No rashes  Psychological: No history of anxiety,  No history of depression   Physical Examination  Vitals:   03/28/21 1326 03/28/21 1330  BP: (!) 156/83 (!) 145/80  Pulse: 86   Resp: 20   Temp: 98.4 F (36.9 C)   TempSrc: Temporal   SpO2: 96%   Weight: 138 lb 4.8 oz (62.7 kg)   Height: _8  (1.626 m)     Body mass index is 23.74 kg/m.  General:  Alert and  oriented, no acute distress HEENT: Normal Neck: No bruit or JVD, left sternocleidomastoid tender to touch with swelling on the left side.  Mild tenderness to pulling on her ear lobe.  No ecchymosis or erythema.  Speech is clear.   Pulmonary: Clear to auscultation bilaterally Cardiac: Regular Rate and Rhythm without murmur Gastrointestinal: Soft, non-tender, non-distended, no mass, no scars Skin: No rash Extremity Pulses:  2+ radial, brachial, femoral, dorsalis pedis, posterior tibial pulses bilaterally Musculoskeletal: Feet and hand deformity due to RA.  Neurologic: Upper and lower extremity motor 5/5 and symmetric  DATA:     Right Carotid Findings:  +----------+-------+-------+--------+---------------------------------+----  ----+       PSV  EDV  StenosisPlaque Description         Comments       cm/s  cm/s                              +----------+-------+-------+--------+---------------------------------+----  ----+  CCA Prox 92   12                               +----------+-------+-------+--------+---------------------------------+----  ----+  CCA Mid  42   9                               +----------+-------+-------+--------+---------------------------------+----  ----+  CCA Distal49   12       heterogenous, calcific and                            irregular                   +----------+-------+-------+--------+---------------------------------+----  ----+  ICA Prox 158  46   40-59% heterogenous, calcific and                            irregular                   +----------+-------+-------+--------+---------------------------------+----  ----+  ICA Mid  151  38                                +----------+-------+-------+--------+---------------------------------+----  ----+  ICA Distal127  35                               +----------+-------+-------+--------+---------------------------------+----  ----+  ECA    160  12       heterogenous and calcific           +----------+-------+-------+--------+---------------------------------+----  ----+   +----------+--------+-------+----------------+-------------------+       PSV cm/sEDV cmsDescribe    Arm Pressure (mmHG)  +----------+--------+-------+----------------+-------------------+  [id number]       Multiphasic, WNL            +----------+--------+-------+----------------+-------------------+   +---------+--------+--+--------+--+---------+  VertebralPSV cm/s46EDV cm/s10Antegrade  +---------+--------+--+--------+--+---------+      Left Carotid Findings:  +----------+--------+--------+--------+------------------+--------+       PSV cm/sEDV cm/sStenosisPlaque DescriptionComments  +----------+--------+--------+--------+------------------+--------+  CCA Prox 80   15                      +----------+--------+--------+--------+------------------+--------+  CCA Mid  57   12                      +----------+--------+--------+--------+------------------+--------+  CCA Distal51   10       homogeneous          +----------+--------+--------+--------+------------------+--------+  ICA Prox 47   10   1-39%  heterogenous         +----------+--------+--------+--------+------------------+--------+  ICA Mid  64   17                      +----------+--------+--------+--------+------------------+--------+  ICA Distal80   21                       +----------+--------+--------+--------+------------------+--------+  ECA    117   11                      +----------+--------+--------+--------+------------------+--------+   +----------+--------+--------+----------------+-------------------+       PSV cm/sEDV cm/sDescribe    Arm Pressure (mmHG)  +----------+--------+--------+----------------+-------------------+  [id number]       Multiphasic, WNL            +----------+--------+--------+----------------+-------------------+   +---------+--------+--+--------+-+---------+  VertebralPSV cm/s28EDV cm/s5Antegrade  +---------+--------+--+--------+-+---------+     Summary:  Right Carotid: Velocities in the right ICA are consistent with a 40-59%         stenosis.   Left Carotid: Velocities in the left ICA are consistent with a 1-39%  stenosis.   Vertebrals: Bilateral vertebral arteries demonstrate  antegrade flow.   ASSESSMENT:  Carotid stenosis s/p  left CEA by Dr. Carlis Abbott 10/10/2018.  Her carotid duplex demonstrates no change with left ICA < 39% stenosis and right < 59 % which has been unchanged in EDV for more than 2 years.  PLAN: She seems to have swelling in her left sternocleidomastoid muscle  , ear pain and HA with unknown cause.  I suggested heat/ice, massage.  She is followed by her primary care, neurologist and rheumatologist.    We will repeat carotid duplex surveillance studies in 1 year.  If she develops symptoms of stroke she will call 911.    Roxy Horseman PA-C [facility] Office: [phone]  MD in clinic [place]

## 2021-03-29 DIAGNOSIS — M25552 Pain in left hip: Secondary | ICD-10-CM | POA: Diagnosis not present

## 2021-04-01 ENCOUNTER — Other Ambulatory Visit: Payer: Self-pay | Admitting: Internal Medicine

## 2021-04-01 MED ORDER — METHOTREXATE SODIUM CHEMO INJECTION 50 MG/2ML: 17.5000 mg | INTRAMUSCULAR | 2 refills | Status: DC

## 2021-04-01 NOTE — Addendum Note (Signed)
Addended by: Collier Salina on: 04/01/2021 09:09 AM   Modules accepted: Orders

## 2021-04-01 NOTE — Progress Notes (Signed)
Lab results look fine also her vitamin D level is back to the recommended range. We can continue the current medication.

## 2021-04-02 ENCOUNTER — Encounter: Payer: Self-pay | Admitting: Obstetrics and Gynecology

## 2021-04-02 ENCOUNTER — Other Ambulatory Visit: Payer: Self-pay

## 2021-04-02 ENCOUNTER — Ambulatory Visit (INDEPENDENT_AMBULATORY_CARE_PROVIDER_SITE_OTHER): Payer: Medicare Other | Admitting: Obstetrics and Gynecology

## 2021-04-02 VITALS — BP 122/80 | HR 70 | Ht 62.0 in | Wt 138.0 lb

## 2021-04-02 DIAGNOSIS — N8189 Other female genital prolapse: Secondary | ICD-10-CM

## 2021-04-02 DIAGNOSIS — S72145A Nondisplaced intertrochanteric fracture of left femur, initial encounter for closed fracture: Secondary | ICD-10-CM | POA: Diagnosis not present

## 2021-04-02 DIAGNOSIS — R300 Dysuria: Secondary | ICD-10-CM | POA: Diagnosis not present

## 2021-04-02 DIAGNOSIS — N898 Other specified noninflammatory disorders of vagina: Secondary | ICD-10-CM | POA: Diagnosis not present

## 2021-04-02 DIAGNOSIS — B373 Candidiasis of vulva and vagina: Secondary | ICD-10-CM

## 2021-04-02 DIAGNOSIS — B3731 Acute candidiasis of vulva and vagina: Secondary | ICD-10-CM

## 2021-04-02 DIAGNOSIS — I6523 Occlusion and stenosis of bilateral carotid arteries: Secondary | ICD-10-CM

## 2021-04-02 LAB — WET PREP FOR TRICH, YEAST, CLUE

## 2021-04-02 MED ORDER — FLUCONAZOLE 150 MG PO TABS
ORAL_TABLET | ORAL | 0 refills | Status: DC
Start: 2021-04-02 — End: 2021-04-21

## 2021-04-02 NOTE — Progress Notes (Signed)
GYNECOLOGY  VISIT   HPI: 87 y.o.   Widowed  Caucasian  female   [phone] with No LMP recorded. Patient is postmenopausal.   here for pessary insertion.    Had fitting for #5 ring with support with Marny Lowenstein on 02/04/21.   Patient complaining of dysuria and vaginal discharge. Some dysuria but not all the time. Occurring for a couple of weeks.  No blood in the urine.  Hx UTIs. No fever or chills. No nausea or vomiting.  Back pain on right flank.   States hx of yeast infections.  Having itching and burning.   Fell and fractured her hip.  Followed by orthopedist.  GYNECOLOGIC HISTORY: No LMP recorded. Patient is postmenopausal. Contraception:  PMP Menopausal hormone therapy:  None Last mammogram:  2001 ?? Thought she aged out Last pap smear:   12-03-17 Neg        OB History    Gravida  5   Para  3   Term      Preterm      AB  2   Living  3     SAB  1   IAB      Ectopic  1   Multiple      Live Births                 Patient Active Problem List   Diagnosis Date Noted  . Arthralgia of lower leg 02/24/2021  . On long term drug therapy 02/24/2021  . Myalgia 02/24/2021  . Anxiety state 02/24/2021  . Diverticular disease of colon 02/24/2021  . Dizziness and giddiness 02/24/2021  . Dysphagia 02/24/2021  . Essential tremor 02/24/2021  . Flatulence, eructation and gas pain 02/24/2021  . Gastroesophageal reflux disease with esophagitis and hemorrhage 02/24/2021  . History of colonic polyps 02/24/2021  . Mixed hyperlipidemia 02/24/2021  . Multiple joint pain 02/24/2021  . Pure hypercholesterolemia 02/24/2021  . Vitamin B12 deficiency 02/24/2021  . Vitamin D deficiency 02/24/2021  . Dysuria 02/24/2021  . Slow transit constipation 02/24/2021  . COVID-19 01/28/2021  . Head trauma 01/06/2021  . Headache, cervicogenic 01/03/2021  . High risk medication use 12/06/2020  . Rosacea 11/29/2020  . Cervical spondylosis 04/18/2020  . RUQ pain 08/01/2019  .  Primary osteoarthritis of first carpometacarpal joint of right hand 05/10/2019  . Headache syndrome 03/09/2019  . Carotid stenosis, left 09/19/2018  . Osteoarthritis of left knee 05/12/2018  . Benign positional vertigo 03/10/2018  . History of lung cancer 11/29/2017  . Difficulty sleeping 09/22/2017  . Diabetes ([place]) 05/13/2017  . Fatigue 05/12/2017  . CAP (community acquired pneumonia) 05/03/2017  . Orthopnea 01/12/2017  . GERD (gastroesophageal reflux disease) 11/09/2016  . Osteoporosis 05/06/2016  . Depression with anxiety 05/06/2016  . Hiatal hernia 05/06/2016  . Hypothyroidism 05/06/2016  . Tremor 10/28/2015  . Memory difficulties 10/28/2015  . Hoarseness 10/14/2015  . 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 ([place]) 05/30/2014  . Diverticulitis of colon without hemorrhage 08/07/2013  . Prolapse of female pelvic organs 06/22/2013  . Neck pain 03/10/2013  . Vaginal atrophy 03/09/2013  . Rectocele 03/09/2013  . Cystocele 03/09/2013  . Urinary incontinence 03/09/2013  . COPD (chronic obstructive pulmonary disease) ([place]) 01/16/2011  . CARCINOMA, LUNG, NONSMALL CELL 12/17/2010  . Rheumatoid arthritis ([place]) 12/17/2010  . Dyspnea on exertion 12/17/2010    Past Medical History:  Diagnosis Date  . Anxiety   . Arthritis    RA  . BPV (benign positional vertigo) 03/10/2018  .  Cancer ([place])    lung ca  . Carotid stenosis, left 09/19/2018  . COPD (chronic obstructive pulmonary disease) ([place])   . Depression   . Dyspnea    with exertion  . GERD (gastroesophageal reflux disease)   . Headache syndrome 03/09/2019  . Hypercholesteremia   . Hypothyroidism   . Memory difficulties 10/28/2015  . Pneumonia     x3  last time 2017  . Pre-diabetes   . Rheumatoid arthritis(714.0)   . Thyroid disease   . Tremor 10/28/2015   Jaw tremor  . Uterine prolapse   . Vitamin D deficiency     Past Surgical History:  Procedure Laterality Date  . ANGIOPLASTY Left 10/10/2018   Procedure:  ANGIOPLASTY using 1cm x 14cm Xenosure Patch;  Surgeon: Marty Heck, MD;  Location: [facility];  Service: Vascular;  Laterality: Left;  . APPENDECTOMY    . COLONOSCOPY W/ POLYPECTOMY    . ENDARTERECTOMY Left 10/10/2018   Procedure: ENDARTERECTOMY CAROTID;  Surgeon: Marty Heck, MD;  Location: [facility];  Service: Vascular;  Laterality: Left;  . EYE SURGERY Bilateral    cataract  . FOOT SURGERY Bilateral    arthritis  . Hands  Bilateral    Arthritis  . HEMORRHOID SURGERY    . right lobectomy  10/2004  . TONSILLECTOMY AND ADENOIDECTOMY      Current Outpatient Medications  Medication Sig Dispense Refill  . acetaminophen (TYLENOL) 325 MG tablet Take 650 mg by mouth every 6 (six) hours as needed for mild pain.     Marland Kitchen albuterol (PROAIR HFA) 108 (90 Base) MCG/ACT inhaler USE 1-2 PUFFS EVERY 4-6 HOURS AS NEEDED 8.5 g 5  . ALPRAZolam (XANAX) 0.25 MG tablet TAKE 1 TABLET BY MOUTH EVERY DAY AS NEEDED FOR ANXIETY 20 tablet 0  . aspirin EC 81 MG tablet Take 81 mg by mouth daily.    . B-D TB SYRINGE 1CC/27GX1/2" 27G X 1/2" 1 ML MISC For use with methotrexate injection weekly 25 each 1  . blood glucose meter kit and supplies KIT Dispense based on patient and insurance preference. Use up to four times daily as directed. (FOR E11.9). 1 each 0  . budesonide-formoterol (SYMBICORT) 160-4.5 MCG/ACT inhaler USE 2 INHALATIONS ORALLY   TWICE DAILY 30.6 g 2  . clidinium-chlordiazePOXIDE (LIBRAX) 5-2.5 MG capsule SMARTSIG:1 Capsule(s) By Mouth Every 12 Hours PRN    . famotidine (PEPCID) 40 MG tablet TAKE 1 TABLET DAILY 90 tablet 3  . fluconazole (DIFLUCAN) 150 MG tablet Take 1 tablet (150 mg total) by mouth every 3 (three) days. 2 tablet 0  . folic acid (FOLVITE) 1 MG tablet Take 1 tablet (1 mg total) by mouth daily. 90 tablet 1  . gabapentin (NEURONTIN) 100 MG capsule Take 1 capsule (100 mg total) by mouth 3 (three) times daily. 90 capsule 5  . glucose blood (ONETOUCH ULTRA) test strip Use to check blood  sugars twice a day 100 each 5  . ibuprofen (ADVIL,MOTRIN) 200 MG tablet Take 200 mg by mouth every 6 (six) hours as needed for headache or mild pain.    Marland Kitchen linaclotide (LINZESS) 72 MCG capsule Take 72 mcg by mouth daily as needed (constipation).     . meclizine (ANTIVERT) 25 MG tablet Take 0.5-1 tablets (12.5-25 mg total) by mouth 3 (three) times daily as needed for dizziness. 60 tablet 5  . methotrexate 50 MG/2ML injection Inject 0.7 mLs (17.5 mg total) into the skin once a week. 4 mL 2  . metroNIDAZOLE (METROGEL)  1 % gel APPLY TO AFFECTED AREA TOPICALLY EVERY DAY 60 g 1  . mupirocin ointment (BACTROBAN) 2 % Place 1 application into the nose 2 (two) times daily. 22 g 1  . nitrofurantoin, macrocrystal-monohydrate, (MACROBID) 100 MG capsule Take 1 capsule (100 mg total) by mouth 2 (two) times daily. 14 capsule 0  . omeprazole (PRILOSEC) 40 MG capsule Take 40 mg by mouth daily.    [name] DELICA LANCETS 76A MISC USE TO CHECK SUGARS UP TO 4 TIMES A DAY  0  . Plecanatide (TRULANCE) 3 MG TABS as needed.    . sertraline (ZOLOFT) 100 MG tablet TAKE 1 TABLET BY MOUTH EVERY DAY 90 tablet 1  . SYNTHROID 88 MCG tablet TAKE 1 TABLET DAILY 90 tablet 1  . Tiotropium Bromide Monohydrate (SPIRIVA RESPIMAT) 2.5 MCG/ACT AERS Inhale 2 puffs into the lungs daily. 12 g 1  . traMADol (ULTRAM) 50 MG tablet Take 50 mg by mouth 3 (three) times daily as needed for moderate pain.   0  . triamcinolone ointment (KENALOG) 0.1 % Apply 1 application topically 2 (two) times daily as needed. 30 g 3  . Vitamin D, Cholecalciferol, 10 MCG (400 UNIT) CHEW Chew by mouth daily. Unsure of current dose     No current facility-administered medications for this visit.     ALLERGIES: Codeine, Demerol, Meperidine hcl, Meperidine hcl, Other, and Sulfa antibiotics  Family History  Problem Relation Age of Onset  . Emphysema Mother   . Asthma Mother   . Lung cancer Father   . Pancreatic cancer Father   . Bone cancer Father   . Heart  disease Brother        x1  . Diabetes Brother   . Hypertension Brother   . Lupus Brother   . Heart disease Sister        x2  . Diabetes Sister   . Hypertension Sister   . Diabetes Sister   . Hypertension Sister   . Diabetes Brother   . Hypertension Brother   . Stroke Maternal Grandfather   . Hypertension Son   . Tremor Son     Social History   Socioeconomic History  . Marital status: Widowed    Spouse name: Not on file  . Number of children: 3  . Years of education: LPN  . Highest education level: Not on file  Occupational History  . Occupation: retired    Fish farm manager: RETIRED    Comment: LPN at [employer]  Tobacco Use  . Smoking status: Former Smoker    Packs/day: 1.00    Years: 20.00    Pack years: 20.00    Types: Cigarettes    Quit date: 11/10/2004    Years since quitting: 16.4  . Smokeless tobacco: Never Used  Vaping Use  . Vaping Use: Never used  Substance and Sexual Activity  . Alcohol use: Yes    Alcohol/week: 1.0 standard drink    Types: 1 Glasses of wine per week    Comment: socially  . Drug use: No  . Sexual activity: Not Currently    Comment: 1st intercourse- 19, partners- 5,   Other Topics Concern  . Not on file  Social History Narrative   Patient drinks about 3 cups of coffee daily.   Patient is right handed.    Social Determinants of Health   Financial Resource Strain: Low Risk   . Difficulty of Paying Living Expenses: Not very hard  Food Insecurity: Not on file  Transportation  Needs: Not on file  Physical Activity: Not on file  Stress: Not on file  Social Connections: Not on file  Intimate Partner Violence: Not on file    Review of Systems  All other systems reviewed and are negative.   PHYSICAL EXAMINATION:    BP 122/80   Pulse 70   Ht _0  (1.575 m)   Wt 138 lb (62.6 kg)   SpO2 100%   BMI 25.24 kg/m     General appearance: alert, cooperative and appears stated age  Pelvic: External genitalia:  no lesions               Urethra:  normal appearing urethra with no masses, tenderness or lesions              Bartholins and Skenes: normal                 Vagina: normal appearing vagina with normal color and large amount of yellow and white discharge present, no lesions              Cervix: no lesions                Bimanual Exam:  Uterus:  normal size, contour, position, consistency, mobility, non-tender              Adnexa: no mass, fullness, tenderness            Old pessary removed and given to the patient.   New 3 inch ring with support, Milex, lot [id number], exp 12/30/25 placed in vaginal for patient.  Patient reports it is comfortable.   Chaperone was present for exam.  ASSESSMENT  Pelvic relaxation. New pessary received today.  Dysuria.  Vaginitis.   PLAN  Wet prep:  Yeast present, negative for clue cells and trichomonas. Diflucan 150 mg po x 1.  May repeat in 72 hours prn.  #2, RF none.  Urine Check:  6 - 20 WBC, NS RBC, 6 - 10 squams, moderate bacteria. UC sent.  No abx given today.  Will await culture. FU in 3 months.  24 min  total time was spent for this patient encounter, including preparation, face-to-face counseling with the patient, coordination of care, and documentation of the encounter.

## 2021-04-04 LAB — URINALYSIS, COMPLETE W/RFL CULTURE
Bilirubin Urine: NEGATIVE
Glucose, UA: NEGATIVE
Hgb urine dipstick: NEGATIVE
Hyaline Cast: NONE SEEN /LPF
Ketones, ur: NEGATIVE
Nitrites, Initial: NEGATIVE
Protein, ur: NEGATIVE
RBC / HPF: NONE SEEN /HPF (ref 0–2)
Specific Gravity, Urine: 1.015 (ref 1.001–1.03)
pH: 5.5 (ref 5.0–8.0)

## 2021-04-04 LAB — URINE CULTURE
MICRO NUMBER:: 11738137
SPECIMEN QUALITY:: ADEQUATE

## 2021-04-04 LAB — CULTURE INDICATED

## 2021-04-09 ENCOUNTER — Telehealth: Payer: Self-pay | Admitting: Internal Medicine

## 2021-04-09 DIAGNOSIS — H8110 Benign paroxysmal vertigo, unspecified ear: Secondary | ICD-10-CM

## 2021-04-09 DIAGNOSIS — G4486 Cervicogenic headache: Secondary | ICD-10-CM

## 2021-04-09 NOTE — Telephone Encounter (Signed)
Patient called and said that she is needing another referral for [facility] neuro. They told her she is needing a new one. Please advise     Phone: [phone]

## 2021-04-15 ENCOUNTER — Other Ambulatory Visit: Payer: Self-pay

## 2021-04-15 ENCOUNTER — Ambulatory Visit: Payer: Medicare Other | Attending: Internal Medicine | Admitting: Physical Therapy

## 2021-04-15 DIAGNOSIS — H8111 Benign paroxysmal vertigo, right ear: Secondary | ICD-10-CM | POA: Insufficient documentation

## 2021-04-15 DIAGNOSIS — R2681 Unsteadiness on feet: Secondary | ICD-10-CM | POA: Diagnosis not present

## 2021-04-16 ENCOUNTER — Encounter: Payer: Self-pay | Admitting: Nurse Practitioner

## 2021-04-16 ENCOUNTER — Ambulatory Visit (INDEPENDENT_AMBULATORY_CARE_PROVIDER_SITE_OTHER): Payer: Medicare Other | Admitting: Nurse Practitioner

## 2021-04-16 ENCOUNTER — Encounter: Payer: Self-pay | Admitting: Physical Therapy

## 2021-04-16 VITALS — BP 122/76

## 2021-04-16 DIAGNOSIS — N3001 Acute cystitis with hematuria: Secondary | ICD-10-CM | POA: Diagnosis not present

## 2021-04-16 DIAGNOSIS — N8189 Other female genital prolapse: Secondary | ICD-10-CM

## 2021-04-16 DIAGNOSIS — R300 Dysuria: Secondary | ICD-10-CM

## 2021-04-16 DIAGNOSIS — N949 Unspecified condition associated with female genital organs and menstrual cycle: Secondary | ICD-10-CM

## 2021-04-16 LAB — WET PREP FOR TRICH, YEAST, CLUE

## 2021-04-16 MED ORDER — NITROFURANTOIN MONOHYD MACRO 100 MG PO CAPS
100.0000 mg | ORAL_CAPSULE | Freq: Two times a day (BID) | ORAL | 0 refills | Status: AC
Start: 2021-04-16 — End: 2021-04-23

## 2021-04-16 MED ORDER — PHENAZOPYRIDINE HCL 100 MG PO TABS: 100.0000 mg | ORAL_TABLET | Freq: Three times a day (TID) | ORAL | 0 refills | Status: DC | PRN

## 2021-04-16 NOTE — Therapy (Signed)
[facility] [address] [place], [place], [address] Phone: [phone]   Fax:  [phone]  Physical Therapy Evaluation  Patient Details  Name: Lisa Vega MRN: [id number] Date of Birth: 10/05/1932 Referring Provider (PT): Binnie Rail, MD   Encounter Date: 04/15/2021   PT End of Session - 04/16/21 2248    Visit Number 1    Number of Visits 9    Date for PT Re-Evaluation 05/16/21    Authorization Type Medicare    Authorization Time Period 04-15-21 - 06-15-21    PT Start Time 1315    PT Stop Time 1400    PT Time Calculation (min) 45 min    Equipment Utilized During Treatment --    Activity Tolerance Patient tolerated treatment well    Behavior During Therapy [facility] for tasks assessed/performed           Past Medical History:  Diagnosis Date  . Anxiety   . Arthritis    RA  . BPV (benign positional vertigo) 03/10/2018  . Cancer ([place])    lung ca  . Carotid stenosis, left 09/19/2018  . COPD (chronic obstructive pulmonary disease) ([place])   . Depression   . Dyspnea    with exertion  . GERD (gastroesophageal reflux disease)   . Headache syndrome 03/09/2019  . Hypercholesteremia   . Hypothyroidism   . Memory difficulties 10/28/2015  . Pneumonia     x3  last time 2017  . Pre-diabetes   . Rheumatoid arthritis(714.0)   . Thyroid disease   . Tremor 10/28/2015   Jaw tremor  . Uterine prolapse   . Vitamin D deficiency     Past Surgical History:  Procedure Laterality Date  . ANGIOPLASTY Left 10/10/2018   Procedure: ANGIOPLASTY using 1cm x 14cm Xenosure Patch;  Surgeon: Marty Heck, MD;  Location: [place];  Service: Vascular;  Laterality: Left;  . APPENDECTOMY    . COLONOSCOPY W/ POLYPECTOMY    . ENDARTERECTOMY Left 10/10/2018   Procedure: ENDARTERECTOMY CAROTID;  Surgeon: Marty Heck, MD;  Location: [facility];  Service: Vascular;  Laterality: Left;  . EYE SURGERY Bilateral    cataract  . FOOT  SURGERY Bilateral    arthritis  . Hands  Bilateral    Arthritis  . HEMORRHOID SURGERY    . right lobectomy  10/2004  . TONSILLECTOMY AND ADENOIDECTOMY      There were no vitals filed for this visit.        [facility] PT Assessment - 04/16/21 0001      Assessment   Medical Diagnosis Vertigo    Referring Provider (PT) Binnie Rail, MD    Prior Therapy Yes - in 2020 and in Feb. '22 for BPPV      Precautions   Precautions None      Balance Screen   Has the patient fallen in the past 6 months Yes    How many times? 1    Has the patient had a decrease in activity level because of a fear of falling?  No    Is the patient reluctant to leave their home because of a fear of falling?  No      Home Social worker Private residence    Living Arrangements Children    Available Help at Discharge Family    Type of [place]      Prior Function   Level of [place]  Vestibular Assessment - 04/16/21 0001      Symptom Behavior   Type of Dizziness  Spinning    Frequency of Dizziness daily    Duration of Dizziness secs to minutes    Symptom Nature Positional    Aggravating Factors Rolling to right    Relieving Factors Head stationary    Progression of Symptoms No change since onset      Oculomotor Exam   Oculomotor Alignment Normal    Spontaneous Absent      Positional Testing   Dix-Hallpike Dix-Hallpike Right;Dix-Hallpike Left    Sidelying Test Sidelying Right;Sidelying Left      Dix-Hallpike Right   Dix-Hallpike Right Duration approx. 20 secs    Dix-Hallpike Right Symptoms Upbeat, right rotatory nystagmus      Dix-Hallpike Left   Dix-Hallpike Left Duration none    Dix-Hallpike Left Symptoms No nystagmus      Sidelying Right   Sidelying Right Duration approx. 10 secs    Sidelying Right Symptoms Upbeat, right rotatory nystagmus      Sidelying Left   Sidelying Left Duration none    Sidelying Left Symptoms No  nystagmus              Objective measurements completed on examination: See above findings.               PT Education - 04/16/21 2247    Education Details pt given info on BPPV etiology from [facility]    Person(s) Educated Patient    Methods Explanation;Handout    Comprehension Verbalized understanding            PT Short Term Goals - 02/14/21 1355      PT SHORT TERM GOAL #1   Title STG = LTGs             PT Long Term Goals - 04/16/21 2254      PT LONG TERM GOAL #1   Title Patient will have negative testing of BPPV indicating resolution of symptoms.    Baseline Positive for R posterior canal BPPV    Time 4    Period Weeks    Status New    Target Date 05/16/21      PT LONG TERM GOAL #2   Title Independent in HEP for habituation and self treatment of BPPV.    Time 4    Period Weeks    Status New    Target Date 05/16/21      PT LONG TERM GOAL #3   Title Assess balance when BPPV has resolved.    Time 4    Period Weeks    Status New    Target Date 05/16/21                  Plan - 04/16/21 2250    Clinical Impression Statement Pt is an 87 yr old lady with c/o dizziness when she rolls onto her right side and occasionally when she lies back in supine position.  Pt has (+) Rt Dix-Hallpike test with right rotary upbeating nystagmus, indicative of Rt BPPV posterior canalithiasis.  Pt was treated with 2 reps of Epley maneuver and no c/o dizziness and no nystagmus noted on 2nd rep.  Cont to assess Rt BPPV and treat as needed.  Balance will be assessed when BPPV has resolved.    Personal Factors and Comorbidities Age;Comorbidity 1    Comorbidities BPPV, RA, OA, cancer, COPD, GERD, hypercholesterolemia, hypothyroidism; fall on 1/7    Examination-Activity Limitations Bed  Mobility;Bend;Reach Overhead;Locomotion Level    Examination-Participation Restrictions Community Activity;Driving    Stability/Clinical Decision Making Stable/Uncomplicated    Clinical  Decision Making Low    Rehab Potential Good    PT Frequency 2x / week    PT Duration 4 weeks   starting week of 2/28 per pt request   PT Treatment/Interventions ADLs/Self Care Home Management;Canalith Repostioning;Gait training;Stair training;Functional mobility training;Therapeutic activities;Balance training;Therapeutic exercise;Neuromuscular re-education;Manual techniques;Patient/family education;Passive range of motion;Dry needling;Taping;Vestibular    PT Next Visit Plan Recheck Rt BPPV - assess balance when resolved    PT Home Exercise Plan Access Code: [email or web address]    Consulted and Agree with Plan of Care Patient           Patient will benefit from skilled therapeutic intervention in order to improve the following deficits and impairments:  Decreased range of motion,Increased fascial restricitons,Impaired UE functional use,Dizziness,Pain,Decreased balance,Hypomobility,Impaired flexibility,Decreased mobility,Improper body mechanics,Postural dysfunction  Visit Diagnosis: BPPV (benign paroxysmal positional vertigo), right - Plan: PT plan of care cert/re-cert  Unsteadiness on feet - Plan: PT plan of care cert/re-cert     Problem List Patient Active Problem List   Diagnosis Date Noted  . Arthralgia of lower leg 02/24/2021  . On long term drug therapy 02/24/2021  . Myalgia 02/24/2021  . Anxiety state 02/24/2021  . Diverticular disease of colon 02/24/2021  . Dizziness and giddiness 02/24/2021  . Dysphagia 02/24/2021  . Essential tremor 02/24/2021  . Flatulence, eructation and gas pain 02/24/2021  . Gastroesophageal reflux disease with esophagitis and hemorrhage 02/24/2021  . History of colonic polyps 02/24/2021  . Mixed hyperlipidemia 02/24/2021  . Multiple joint pain 02/24/2021  . Pure hypercholesterolemia 02/24/2021  . Vitamin B12 deficiency 02/24/2021  . Vitamin D deficiency 02/24/2021  . Dysuria 02/24/2021  . Slow transit constipation 02/24/2021  . COVID-19 01/28/2021  .  Head trauma 01/06/2021  . Headache, cervicogenic 01/03/2021  . High risk medication use 12/06/2020  . Rosacea 11/29/2020  . Cervical spondylosis 04/18/2020  . RUQ pain 08/01/2019  . Primary osteoarthritis of first carpometacarpal joint of right hand 05/10/2019  . Headache syndrome 03/09/2019  . Carotid stenosis, left 09/19/2018  . Osteoarthritis of left knee 05/12/2018  . Benign positional vertigo 03/10/2018  . History of lung cancer 11/29/2017  . Difficulty sleeping 09/22/2017  . Diabetes ([place]) 05/13/2017  . Fatigue 05/12/2017  . CAP (community acquired pneumonia) 05/03/2017  . Orthopnea 01/12/2017  . GERD (gastroesophageal reflux disease) 11/09/2016  . Osteoporosis 05/06/2016  . Depression with anxiety 05/06/2016  . Hiatal hernia 05/06/2016  . Hypothyroidism 05/06/2016  . Tremor 10/28/2015  . Memory difficulties 10/28/2015  . Hoarseness 10/14/2015  . 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 ([place]) 05/30/2014  . Diverticulitis of colon without hemorrhage 08/07/2013  . Prolapse of female pelvic organs 06/22/2013  . Neck pain 03/10/2013  . Vaginal atrophy 03/09/2013  . Rectocele 03/09/2013  . Cystocele 03/09/2013  . Urinary incontinence 03/09/2013  . COPD (chronic obstructive pulmonary disease) ([place]) 01/16/2011  . CARCINOMA, LUNG, NONSMALL CELL 12/17/2010  . Rheumatoid arthritis ([place]) 12/17/2010  . Dyspnea on exertion 12/17/2010    Alda Lea, PT 04/16/2021, 10:59 PM  [facility] [address] [place], [place], [address] Phone: [phone]   Fax:  [phone]  Name: Lisa Vega MRN: [id number] Date of Birth: 1932/09/07

## 2021-04-16 NOTE — Progress Notes (Signed)
   Acute Office Visit  Subjective:    Patient ID: Lisa Vega, female    DOB: 03-03-1932, 87 y.o.   MRN: [id number]   HPI 87 y.o. presents today for dysuria and lower abdominal pressure. She was treated for vaginal yeast infection 04/02/2021, urine culture at the time showed mixed genital flora in specimen. She reports that symptoms have progressively worsened. She had new pessary inserted at last visit and wants positioning checked as well. Reports good management of prolapse.    Review of Systems  Constitutional: Negative.  Negative for fever.  Genitourinary: Positive for dysuria, frequency and urgency. Negative for hematuria and vaginal discharge.       Objective:    Physical Exam Constitutional:      Appearance: Normal appearance.  Genitourinary:    General: Normal vulva.     Vagina: Normal. No vaginal discharge or erythema.     Comments: Pessary in correct position with good support    BP 122/76  Wt Readings from Last 3 Encounters:  04/02/21 138 lb (62.6 kg)  03/28/21 138 lb 4.8 oz (62.7 kg)  03/26/21 139 lb (63 kg)   Wet prep negative UA: 3+ leukocytes, wbc packed, nitrate negative, 2+ blood, rbc 40-60, 2+ protein, many bacteria     Assessment & Plan:   Problem List Items Addressed This Visit      Other   Dysuria   Relevant Medications   phenazopyridine (PYRIDIUM) 100 MG tablet   Other Relevant Orders   Urinalysis,Complete w/RFL Culture    Other Visit Diagnoses    Acute cystitis with hematuria    -  Primary   Relevant Medications   nitrofurantoin, macrocrystal-monohydrate, (MACROBID) 100 MG capsule   phenazopyridine (PYRIDIUM) 100 MG tablet   Vaginal burning       Relevant Orders   WET PREP FOR TRICH, YEAST, CLUE   Pelvic relaxation         Plan: Discussed that previous urinalysis appears to have been contaminated with genital bacteria. UA and symptoms suggestive of UTI. Macrobid 100 mg twice daily x 7 days and pyridium for symptom management.  Urine culture pending, will triage based on results. Negative wet prep. Reassurred that pessary is positioned correctly and appears to be supporting well. She will return in 3 months for pessary maintenance. She is agreeable to plan.      Tamela Gammon DNP, 10:30 AM 04/16/2021

## 2021-04-19 LAB — URINALYSIS, COMPLETE W/RFL CULTURE
Bilirubin Urine: NEGATIVE
Glucose, UA: NEGATIVE
Hyaline Cast: NONE SEEN /LPF
Ketones, ur: NEGATIVE
Nitrites, Initial: NEGATIVE
Specific Gravity, Urine: 1.01 (ref 1.001–1.035)
pH: 5.5 (ref 5.0–8.0)

## 2021-04-19 LAB — URINE CULTURE
MICRO NUMBER:: 11792408
SPECIMEN QUALITY:: ADEQUATE

## 2021-04-19 LAB — CULTURE INDICATED

## 2021-04-21 ENCOUNTER — Other Ambulatory Visit: Payer: Self-pay | Admitting: Nurse Practitioner

## 2021-04-21 ENCOUNTER — Telehealth: Payer: Self-pay

## 2021-04-21 DIAGNOSIS — B3731 Acute candidiasis of vulva and vagina: Secondary | ICD-10-CM

## 2021-04-21 DIAGNOSIS — B373 Candidiasis of vulva and vagina: Secondary | ICD-10-CM

## 2021-04-21 DIAGNOSIS — N3001 Acute cystitis with hematuria: Secondary | ICD-10-CM

## 2021-04-21 MED ORDER — FLUCONAZOLE 150 MG PO TABS: 150.0000 mg | ORAL_TABLET | Freq: Once | ORAL | 0 refills | Status: AC

## 2021-04-21 MED ORDER — CEPHALEXIN 500 MG PO CAPS: 500.0000 mg | ORAL_CAPSULE | Freq: Two times a day (BID) | ORAL | 0 refills | Status: AC

## 2021-04-21 NOTE — Telephone Encounter (Signed)
-----   Message from Tamela Gammon, NP sent at 04/21/2021  8:10 AM EDT ----- Please let patient know her urine culture did come back positive for an organism called Klebsiella pneumoniae. The antibiotic she if currently taking is in the "intermediate" category so I do recommend we switch to Cephalexin twice a day for 5 days for better coverage. I have sent this to her pharmacy. I do recommend she take with food to avoid stomach upset. Thank you.

## 2021-04-21 NOTE — Progress Notes (Signed)
Urine culture came back positive for Klebsiella Pneumoniae and requires change in antibiotic. We will avoid Augmentin due to weekly Methotrexate and high risk for toxicity and do Cephalexin BID x 5 days.

## 2021-04-21 NOTE — Telephone Encounter (Signed)
Patient was informed. She wanted me to ask you if you will send in Rx for yeast infection. She said since taking the antibiotic she has "terrible itching in my vagina".

## 2021-04-21 NOTE — Telephone Encounter (Signed)
Diflucan #1 sent to her pharmacy.

## 2021-04-24 ENCOUNTER — Ambulatory Visit: Payer: Medicare Other | Admitting: Physical Therapy

## 2021-04-30 DIAGNOSIS — L718 Other rosacea: Secondary | ICD-10-CM | POA: Diagnosis not present

## 2021-04-30 DIAGNOSIS — H18513 Endothelial corneal dystrophy, bilateral: Secondary | ICD-10-CM | POA: Diagnosis not present

## 2021-04-30 DIAGNOSIS — H02403 Unspecified ptosis of bilateral eyelids: Secondary | ICD-10-CM | POA: Diagnosis not present

## 2021-04-30 DIAGNOSIS — Z961 Presence of intraocular lens: Secondary | ICD-10-CM | POA: Diagnosis not present

## 2021-04-30 DIAGNOSIS — H02826 Cysts of left eye, unspecified eyelid: Secondary | ICD-10-CM | POA: Diagnosis not present

## 2021-05-01 ENCOUNTER — Ambulatory Visit (INDEPENDENT_AMBULATORY_CARE_PROVIDER_SITE_OTHER): Payer: Medicare Other | Admitting: Nurse Practitioner

## 2021-05-01 ENCOUNTER — Other Ambulatory Visit: Payer: Self-pay

## 2021-05-01 ENCOUNTER — Ambulatory Visit: Payer: Medicare Other | Attending: Internal Medicine | Admitting: Physical Therapy

## 2021-05-01 ENCOUNTER — Encounter: Payer: Self-pay | Admitting: Nurse Practitioner

## 2021-05-01 VITALS — BP 118/70 | HR 74 | Temp 98.3°F | Resp 16 | Wt 138.0 lb

## 2021-05-01 DIAGNOSIS — N952 Postmenopausal atrophic vaginitis: Secondary | ICD-10-CM | POA: Diagnosis not present

## 2021-05-01 DIAGNOSIS — R102 Pelvic and perineal pain: Secondary | ICD-10-CM | POA: Diagnosis not present

## 2021-05-01 DIAGNOSIS — B373 Candidiasis of vulva and vagina: Secondary | ICD-10-CM | POA: Diagnosis not present

## 2021-05-01 DIAGNOSIS — B3731 Acute candidiasis of vulva and vagina: Secondary | ICD-10-CM

## 2021-05-01 DIAGNOSIS — Z4689 Encounter for fitting and adjustment of other specified devices: Secondary | ICD-10-CM

## 2021-05-01 DIAGNOSIS — H8111 Benign paroxysmal vertigo, right ear: Secondary | ICD-10-CM | POA: Insufficient documentation

## 2021-05-01 DIAGNOSIS — N9489 Other specified conditions associated with female genital organs and menstrual cycle: Secondary | ICD-10-CM

## 2021-05-01 LAB — WET PREP FOR TRICH, YEAST, CLUE

## 2021-05-01 MED ORDER — TERCONAZOLE 0.4 % VA CREA: 1.0000 | TOPICAL_CREAM | Freq: Every day | VAGINAL | 0 refills | Status: DC

## 2021-05-01 MED ORDER — FLUCONAZOLE 150 MG PO TABS
150.0000 mg | ORAL_TABLET | Freq: Once | ORAL | 0 refills | Status: AC
Start: 2021-05-01 — End: 2021-05-01

## 2021-05-01 NOTE — Progress Notes (Signed)
GYNECOLOGY  VISIT  CC:   Vagina feels like it is burning and itching x few months, worse after antibiotics. Took Diflucan on several ocassions, last time 04/21/2021. Helped a little  HPI: 87 y.o. [id number] Widowed White or Caucasian female here for vaginitis. Has a pessary, thinks she has difficulty urinating at times but does not want to be with out it because her bladder falls out.     GYNECOLOGIC HISTORY: No LMP recorded. Patient is postmenopausal. Contraception: post menopausal Menopausal hormone therapy: none  Patient Active Problem List   Diagnosis Date Noted  . Arthralgia of lower leg 02/24/2021  . On long term drug therapy 02/24/2021  . Myalgia 02/24/2021  . Anxiety state 02/24/2021  . Diverticular disease of colon 02/24/2021  . Dizziness and giddiness 02/24/2021  . Dysphagia 02/24/2021  . Essential tremor 02/24/2021  . Flatulence, eructation and gas pain 02/24/2021  . Gastroesophageal reflux disease with esophagitis and hemorrhage 02/24/2021  . History of colonic polyps 02/24/2021  . Mixed hyperlipidemia 02/24/2021  . Multiple joint pain 02/24/2021  . Pure hypercholesterolemia 02/24/2021  . Vitamin B12 deficiency 02/24/2021  . Vitamin D deficiency 02/24/2021  . Dysuria 02/24/2021  . Slow transit constipation 02/24/2021  . COVID-19 01/28/2021  . Head trauma 01/06/2021  . Headache, cervicogenic 01/03/2021  . High risk medication use 12/06/2020  . Rosacea 11/29/2020  . Cervical spondylosis 04/18/2020  . RUQ pain 08/01/2019  . Primary osteoarthritis of first carpometacarpal joint of right hand 05/10/2019  . Headache syndrome 03/09/2019  . Carotid stenosis, left 09/19/2018  . Osteoarthritis of left knee 05/12/2018  . Benign positional vertigo 03/10/2018  . History of lung cancer 11/29/2017  . Difficulty sleeping 09/22/2017  . Diabetes ([place]) 05/13/2017  . Fatigue 05/12/2017  . CAP (community acquired pneumonia) 05/03/2017  . Orthopnea 01/12/2017  . GERD  (gastroesophageal reflux disease) 11/09/2016  . Osteoporosis 05/06/2016  . Depression with anxiety 05/06/2016  . Hiatal hernia 05/06/2016  . Hypothyroidism 05/06/2016  . Tremor 10/28/2015  . Memory difficulties 10/28/2015  . Hoarseness 10/14/2015  . 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 ([place]) 05/30/2014  . Diverticulitis of colon without hemorrhage 08/07/2013  . Prolapse of female pelvic organs 06/22/2013  . Neck pain 03/10/2013  . Vaginal atrophy 03/09/2013  . Rectocele 03/09/2013  . Cystocele 03/09/2013  . Urinary incontinence 03/09/2013  . COPD (chronic obstructive pulmonary disease) ([place]) 01/16/2011  . CARCINOMA, LUNG, NONSMALL CELL 12/17/2010  . Rheumatoid arthritis ([place]) 12/17/2010  . Dyspnea on exertion 12/17/2010    Past Medical History:  Diagnosis Date  . Anxiety   . Arthritis    RA  . BPV (benign positional vertigo) 03/10/2018  . Cancer ([place])    lung ca  . Carotid stenosis, left 09/19/2018  . COPD (chronic obstructive pulmonary disease) ([place])   . Depression   . Dyspnea    with exertion  . GERD (gastroesophageal reflux disease)   . Headache syndrome 03/09/2019  . Hypercholesteremia   . Hypothyroidism   . Memory difficulties 10/28/2015  . Pneumonia     x3  last time 2017  . Pre-diabetes   . Rheumatoid arthritis(714.0)   . Thyroid disease   . Tremor 10/28/2015   Jaw tremor  . Uterine prolapse   . Vitamin D deficiency     Past Surgical History:  Procedure Laterality Date  . ANGIOPLASTY Left 10/10/2018   Procedure: ANGIOPLASTY using 1cm x 14cm Xenosure Patch;  Surgeon: Marty Heck, MD;  Location: [facility];  Service: Vascular;  Laterality:  Left;  . APPENDECTOMY    . COLONOSCOPY W/ POLYPECTOMY    . ENDARTERECTOMY Left 10/10/2018   Procedure: ENDARTERECTOMY CAROTID;  Surgeon: Clark, Christopher J, MD;  Location[facility];  Service: Vascular;  Laterality: Left;  . EYE SURGERY Bilateral    cataract  . FOOT SURGERY Bilateral    arthritis  .  Hands  Bilateral    Arthritis  . HEMORRHOID SURGERY    . right lobectomy  10/2004  . TONSILLECTOMY AND ADENOIDECTOMY      MEDS:   Current Outpatient Medications on File Prior to Visit  Medication Sig Dispense Refill  . acetaminophen (TYLENOL) 325 MG tablet Take 650 mg by mouth every 6 (six) hours as needed for mild pain.     . albuterol (PROAIR HFA) 108 (90 Base) MCG/ACT inhaler USE 1-2 PUFFS EVERY 4-6 HOURS AS NEEDED 8.5 g 5  . ALPRAZolam (XANAX) 0.25 MG tablet TAKE 1 TABLET BY MOUTH EVERY DAY AS NEEDED FOR ANXIETY 20 tablet 0  . aspirin EC 81 MG tablet Take 81 mg by mouth daily.    . B-D TB SYRINGE 1CC/27GX1/2" 27G X 1/2" 1 ML MISC For use with methotrexate injection weekly 25 each 1  . blood glucose meter kit and supplies KIT Dispense based on patient and insurance preference. Use up to four times daily as directed. (FOR E11.9). 1 each 0  . budesonide-formoterol (SYMBICORT) 160-4.5 MCG/ACT inhaler USE 2 INHALATIONS ORALLY   TWICE DAILY 30.6 g 2  . clidinium-chlordiazePOXIDE (LIBRAX) 5-2.5 MG capsule SMARTSIG:1 Capsule(s) By Mouth Every 12 Hours PRN    . famotidine (PEPCID) 40 MG tablet TAKE 1 TABLET DAILY 90 tablet 3  . fluconazole (DIFLUCAN) 150 MG tablet Take 150 mg by mouth once.    . folic acid (FOLVITE) 1 MG tablet Take 1 tablet (1 mg total) by mouth daily. 90 tablet 1  . gabapentin (NEURONTIN) 100 MG capsule Take 1 capsule (100 mg total) by mouth 3 (three) times daily. 90 capsule 5  . glucose blood (ONETOUCH ULTRA) test strip Use to check blood sugars twice a day 100 each 5  . ibuprofen (ADVIL,MOTRIN) 200 MG tablet Take 200 mg by mouth every 6 (six) hours as needed for headache or mild pain.    . linaclotide (LINZESS) 72 MCG capsule Take 72 mcg by mouth daily as needed (constipation).     . meclizine (ANTIVERT) 25 MG tablet Take 0.5-1 tablets (12.5-25 mg total) by mouth 3 (three) times daily as needed for dizziness. 60 tablet 5  . mupirocin ointment (BACTROBAN) 2 % Place 1  application into the nose 2 (two) times daily. 22 g 1  . omeprazole (PRILOSEC) 40 MG capsule Take 40 mg by mouth daily.    . Plecanatide (TRULANCE) 3 MG TABS as needed.    . sertraline (ZOLOFT) 100 MG tablet TAKE 1 TABLET BY MOUTH EVERY DAY 90 tablet 1  . SYNTHROID 88 MCG tablet TAKE 1 TABLET DAILY 90 tablet 1  . Tiotropium Bromide Monohydrate (SPIRIVA RESPIMAT) 2.5 MCG/ACT AERS Inhale 2 puffs into the lungs daily. 12 g 1  . traMADol (ULTRAM) 50 MG tablet Take 50 mg by mouth 3 (three) times daily as needed for moderate pain.   0  . triamcinolone ointment (KENALOG) 0.1 % Apply 1 application topically 2 (two) times daily as needed. 30 g 3  . Vitamin D, Cholecalciferol, 10 MCG (400 UNIT) CHEW Chew by mouth daily. Unsure of current dose    . methotrexate 50 MG/2ML injection Inject 0.7   mLs (17.5 mg total) into the skin once a week. 4 mL 2  . Methotrexate Sodium (METHOTREXATE, PF,) 50 MG/2ML injection     . ONETOUCH DELICA LANCETS 33G MISC USE TO CHECK SUGARS UP TO 4 TIMES A DAY  0   No current facility-administered medications on file prior to visit.    ALLERGIES: Codeine, Demerol, Meperidine hcl, Meperidine hcl, Other, and Sulfa antibiotics  Family History  Problem Relation Age of Onset  . Emphysema Mother   . Asthma Mother   . Lung cancer Father   . Pancreatic cancer Father   . Bone cancer Father   . Heart disease Brother        x1  . Diabetes Brother   . Hypertension Brother   . Lupus Brother   . Heart disease Sister        x2  . Diabetes Sister   . Hypertension Sister   . Diabetes Sister   . Hypertension Sister   . Diabetes Brother   . Hypertension Brother   . Stroke Maternal Grandfather   . Hypertension Son   . Tremor Son      Review of Systems  Constitutional: Negative.   HENT: Negative.   Eyes: Negative.   Respiratory: Negative.   Cardiovascular: Negative.   Gastrointestinal: Negative.   Endocrine: Negative.   Genitourinary: Negative.        Vaginal itching &  burning  Musculoskeletal: Negative.   Skin: Negative.   Allergic/Immunologic: Negative.   Neurological: Negative.   Hematological: Negative.   Psychiatric/Behavioral: Negative.     PHYSICAL EXAMINATION:    BP 118/70   Pulse 74   Temp 98.3 F (36.8 C) (Oral)   Resp 16   Wt 138 lb (62.6 kg)   BMI 25.24 kg/m     General appearance: alert, cooperative, no acute distress  Pelvic: External genitalia:  Swelling, redness, atrophy              Bartholins and Skenes: normal                 Vagina: smooth, atrophic, pelvic relaxation, red, small amount creamy discharge, ph 5.0 Pessary removed, cleaned, replaced             Wet mount + yeast, neg clue, neg trich Chaperone,Joy, CMA, was present for exam.  Assessment: Vaginal atrophy  Pessary maintenance If continued difficulty with urination, may consider different size and or shape of pessary.   Vulvar burning - Plan: WET PREP FOR TRICH, YEAST, CLUE  Pelvic pain - Plan: Urine Culture, CANCELED: Urinalysis,Complete w/RFL Culture. Pt unable to get enough urine for urinalysis and culture. Suspect some urinary retention R/T swelling. Pt reports when she lies down for 30 min, she will be able to go. Advised to contact office if unable to urinate.  Yeast vaginitis - Plan: terconazole (TERAZOL 7) 0.4 % vaginal cream, fluconazole (DIFLUCAN) 150 MG tablet  F/U if problems or 3 months for pessary care       

## 2021-05-02 ENCOUNTER — Ambulatory Visit: Payer: Medicare Other | Admitting: Obstetrics & Gynecology

## 2021-05-02 LAB — URINE CULTURE
MICRO NUMBER:: 11854788
SPECIMEN QUALITY:: ADEQUATE

## 2021-05-02 NOTE — Therapy (Signed)
[facility] [address] [place], [place], [address] Phone: [phone]   Fax:  [phone]  Physical Therapy Treatment  Patient Details  Name: Lisa Vega MRN: [id number] Date of Birth: [birth year] Referring Provider (PT): Binnie Rail, MD   Encounter Date: 05/01/2021   PT End of Session - 05/02/21 1854    Visit Number 2    Number of Visits 9    Date for PT Re-Evaluation 05/16/21    Authorization Type Medicare    Authorization Time Period 04-15-21 - 06-15-21    PT Start Time 1150    PT Stop Time 1230    PT Time Calculation (min) 40 min    Activity Tolerance Patient tolerated treatment well    Behavior During Therapy [facility] for tasks assessed/performed           Past Medical History:  Diagnosis Date  . Anxiety   . Arthritis    RA  . BPV (benign positional vertigo) 03/10/2018  . Cancer ([place])    lung ca  . Carotid stenosis, left 09/19/2018  . COPD (chronic obstructive pulmonary disease) ([place])   . Depression   . Dyspnea    with exertion  . GERD (gastroesophageal reflux disease)   . Headache syndrome 03/09/2019  . Hypercholesteremia   . Hypothyroidism   . Memory difficulties 10/28/2015  . Pneumonia     x3  last time 2017  . Pre-diabetes   . Rheumatoid arthritis(714.0)   . Thyroid disease   . Tremor 10/28/2015   Jaw tremor  . Uterine prolapse   . Vitamin D deficiency     Past Surgical History:  Procedure Laterality Date  . ANGIOPLASTY Left 10/10/2018   Procedure: ANGIOPLASTY using 1cm x 14cm Xenosure Patch;  Surgeon: Marty Heck, MD;  Location: [facility];  Service: Vascular;  Laterality: Left;  . APPENDECTOMY    . COLONOSCOPY W/ POLYPECTOMY    . ENDARTERECTOMY Left 10/10/2018   Procedure: ENDARTERECTOMY CAROTID;  Surgeon: Marty Heck, MD;  Location: [facility];  Service: Vascular;  Laterality: Left;  . EYE SURGERY Bilateral    cataract  . FOOT SURGERY Bilateral    arthritis  . Hands   Bilateral    Arthritis  . HEMORRHOID SURGERY    . right lobectomy  10/2004  . TONSILLECTOMY AND ADENOIDECTOMY      There were no vitals filed for this visit.   Subjective Assessment - 05/02/21 1845    Subjective Pt reports vertigo is much improved  since eval 2 weeks ago - states she is not having much dizziness at all but wants to have it rechecked; pt states she wants to cancel her scheduled appts due to transportation difficulties in getting to center - "my daughter won't let me drive"    Pertinent History RA with knee pain, COPD, cancer    Patient Stated Goals Resolve the vertigo    Currently in Pain? No/denies                       Rt Dix-Hallpike test - questionable nystagmus noted (hard to discern) but pt reported dizziness in test position  Self Care; discussed Laruth Bouchard- Daroff exercises for self treatment and Epley maneuver for self treatment prn should BPPV re-occur        Vestibular Treatment/Exercise - 05/02/21 0001      Vestibular Treatment/Exercise   Vestibular Treatment Provided Canalith Repositioning    Canalith Repositioning Epley Manuever Right  EPLEY MANUEVER RIGHT   Number of Reps  3    Overall Response Improved Symptoms    Response Details  no nystagmus and no c/o vertigo reported on 3rd rep of Epley maneuver                      PT Long Term Goals - 05/02/21 1901      PT LONG TERM GOAL #1   Title Patient will have negative testing of BPPV indicating resolution of symptoms.    Baseline Positive for R posterior canal BPPV    Time 4    Period Weeks    Status New      PT LONG TERM GOAL #2   Title Independent in HEP for habituation and self treatment of BPPV.    Time 4    Period Weeks    Status New      PT LONG TERM GOAL #3   Title Assess balance when BPPV has resolved.    Time 4    Period Weeks    Status New                 Plan - 05/02/21 1855    Clinical Impression Statement Pt had no nystagmus and  no c/o vertigo on 3rd rep of Epley (minimal nystagmus was noted on 1st rep of Epley but pt reported dizziness in initial position of maneuver); Rt BPPV appears to be resolved at this time.  Pt declines further treatment sessions to address balance deficits, stating she is having transportation problems at this time and states "my daughter won't let me drive".  Pt requests chart to be kept open for 30 days so that she may return for treatment should BPPV re-occur.    Personal Factors and Comorbidities Age;Comorbidity 1    Comorbidities BPPV, RA, OA, cancer, COPD, GERD, hypercholesterolemia, hypothyroidism; fall on 1/7    Examination-Activity Limitations Bed Mobility;Bend;Reach Overhead;Locomotion Level    Examination-Participation Restrictions Community Activity;Driving    Stability/Clinical Decision Making Stable/Uncomplicated    Rehab Potential Good    PT Frequency 2x / week    PT Duration 4 weeks   starting week of 2/28 per pt request   PT Treatment/Interventions ADLs/Self Care Home Management;Canalith Repostioning;Gait training;Stair training;Functional mobility training;Therapeutic activities;Balance training;Therapeutic exercise;Neuromuscular re-education;Manual techniques;Patient/family education;Passive range of motion;Dry needling;Taping;Vestibular    PT Next Visit Plan Recheck Rt BPPV - pt declines balance training due to transportation problems at this time    PT Home Exercise Plan Access Code: [email or web address]    Consulted and Agree with Plan of Care Patient           Patient will benefit from skilled therapeutic intervention in order to improve the following deficits and impairments:  Decreased range of motion,Increased fascial restricitons,Impaired UE functional use,Dizziness,Pain,Decreased balance,Hypomobility,Impaired flexibility,Decreased mobility,Improper body mechanics,Postural dysfunction  Visit Diagnosis: BPPV (benign paroxysmal positional vertigo), right     Problem  List Patient Active Problem List   Diagnosis Date Noted  . Arthralgia of lower leg 02/24/2021  . On long term drug therapy 02/24/2021  . Myalgia 02/24/2021  . Anxiety state 02/24/2021  . Diverticular disease of colon 02/24/2021  . Dizziness and giddiness 02/24/2021  . Dysphagia 02/24/2021  . Essential tremor 02/24/2021  . Flatulence, eructation and gas pain 02/24/2021  . Gastroesophageal reflux disease with esophagitis and hemorrhage 02/24/2021  . History of colonic polyps 02/24/2021  . Mixed hyperlipidemia 02/24/2021  . Multiple joint pain 02/24/2021  . Pure hypercholesterolemia 02/24/2021  . Vitamin  B12 deficiency 02/24/2021  . Vitamin D deficiency 02/24/2021  . Dysuria 02/24/2021  . Slow transit constipation 02/24/2021  . COVID-19 01/28/2021  . Head trauma 01/06/2021  . Headache, cervicogenic 01/03/2021  . High risk medication use 12/06/2020  . Rosacea 11/29/2020  . Cervical spondylosis 04/18/2020  . RUQ pain 08/01/2019  . Primary osteoarthritis of first carpometacarpal joint of right hand 05/10/2019  . Headache syndrome 03/09/2019  . Carotid stenosis, left 09/19/2018  . Osteoarthritis of left knee 05/12/2018  . Benign positional vertigo 03/10/2018  . History of lung cancer 11/29/2017  . Difficulty sleeping 09/22/2017  . Diabetes ([place]) 05/13/2017  . Fatigue 05/12/2017  . CAP (community acquired pneumonia) 05/03/2017  . Orthopnea 01/12/2017  . GERD (gastroesophageal reflux disease) 11/09/2016  . Osteoporosis 05/06/2016  . Depression with anxiety 05/06/2016  . Hiatal hernia 05/06/2016  . Hypothyroidism 05/06/2016  . Tremor 10/28/2015  . Memory difficulties 10/28/2015  . Hoarseness 10/14/2015  . 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 ([place]) 05/30/2014  . Diverticulitis of colon without hemorrhage 08/07/2013  . Prolapse of female pelvic organs 06/22/2013  . Neck pain 03/10/2013  . Vaginal atrophy 03/09/2013  . Rectocele 03/09/2013  . Cystocele 03/09/2013   . Urinary incontinence 03/09/2013  . COPD (chronic obstructive pulmonary disease) ([place]) 01/16/2011  . CARCINOMA, LUNG, NONSMALL CELL 12/17/2010  . Rheumatoid arthritis ([place]) 12/17/2010  . Dyspnea on exertion 12/17/2010    Alda Lea, PT 05/02/2021, 7:02 PM  [facility] [address] [place], [place], [address] Phone: [phone]   Fax:  [phone]  Name: Lisa Vega MRN: [id number] Date of Birth: 1932-03-27

## 2021-05-06 ENCOUNTER — Ambulatory Visit: Payer: Medicare Other | Admitting: Physical Therapy

## 2021-05-15 ENCOUNTER — Ambulatory Visit: Payer: Medicare Other | Admitting: Physical Therapy

## 2021-05-21 DIAGNOSIS — S72145A Nondisplaced intertrochanteric fracture of left femur, initial encounter for closed fracture: Secondary | ICD-10-CM | POA: Diagnosis not present

## 2021-05-21 DIAGNOSIS — M25552 Pain in left hip: Secondary | ICD-10-CM | POA: Diagnosis not present

## 2021-05-22 ENCOUNTER — Encounter: Payer: Medicare Other | Admitting: Physical Therapy

## 2021-05-29 DIAGNOSIS — R1314 Dysphagia, pharyngoesophageal phase: Secondary | ICD-10-CM | POA: Diagnosis not present

## 2021-05-29 DIAGNOSIS — M26629 Arthralgia of temporomandibular joint, unspecified side: Secondary | ICD-10-CM | POA: Diagnosis not present

## 2021-05-29 DIAGNOSIS — K219 Gastro-esophageal reflux disease without esophagitis: Secondary | ICD-10-CM | POA: Diagnosis not present

## 2021-05-29 DIAGNOSIS — M069 Rheumatoid arthritis, unspecified: Secondary | ICD-10-CM | POA: Diagnosis not present

## 2021-05-30 ENCOUNTER — Ambulatory Visit: Payer: Medicare Other | Admitting: Internal Medicine

## 2021-06-01 ENCOUNTER — Encounter: Payer: Self-pay | Admitting: Internal Medicine

## 2021-06-01 NOTE — Progress Notes (Signed)
Subjective:    Patient ID: Lisa Vega, female    DOB: 11/25/1932, 87 y.o.   MRN: [id number]  HPI The patient is here for follow up of their chronic medical problems, including DM, hypothyroidism, depression, anxiety, GERD  She saw ENT - she will have an xray of her throat.  Her left ear hurts and she gets pain and swelling on the left side of her neck.    Last night she had right sided abdominal pain.  She had to take a pain pill.  It still hurts still.    She has posterior and top of the head headaches - it can be a burning pain.        Medications and allergies reviewed with patient and updated if appropriate.  Patient Active Problem List   Diagnosis Date Noted  . Arthralgia of lower leg 02/24/2021  . On long term drug therapy 02/24/2021  . Myalgia 02/24/2021  . Anxiety state 02/24/2021  . Diverticular disease of colon 02/24/2021  . Dizziness and giddiness 02/24/2021  . Dysphagia 02/24/2021  . Essential tremor 02/24/2021  . Flatulence, eructation and gas pain 02/24/2021  . Gastroesophageal reflux disease with esophagitis and hemorrhage 02/24/2021  . History of colonic polyps 02/24/2021  . Mixed hyperlipidemia 02/24/2021  . Pure hypercholesterolemia 02/24/2021  . Vitamin B12 deficiency 02/24/2021  . Vitamin D deficiency 02/24/2021  . Dysuria 02/24/2021  . Slow transit constipation 02/24/2021  . COVID-19 01/28/2021  . Headache, cervicogenic 01/03/2021  . High risk medication use 12/06/2020  . Rosacea 11/29/2020  . Cervical spondylosis 04/18/2020  . RUQ pain 08/01/2019  . Primary osteoarthritis of first carpometacarpal joint of right hand 05/10/2019  . Headache syndrome 03/09/2019  . Carotid stenosis, left 09/19/2018  . Osteoarthritis of left knee 05/12/2018  . Benign positional vertigo 03/10/2018  . History of lung cancer 11/29/2017  . Difficulty sleeping 09/22/2017  . Diabetes ([place]) 05/13/2017  . Fatigue 05/12/2017  . CAP (community acquired pneumonia)  05/03/2017  . Orthopnea 01/12/2017  . GERD (gastroesophageal reflux disease) 11/09/2016  . Osteoporosis 05/06/2016  . Depression 05/06/2016  . Hiatal hernia 05/06/2016  . Hypothyroidism 05/06/2016  . Tremor 10/28/2015  . Memory difficulties 10/28/2015  . Hoarseness 10/14/2015  . 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 ([place]) 05/30/2014  . Diverticulitis of colon without hemorrhage 08/07/2013  . Prolapse of female pelvic organs 06/22/2013  . Neck pain 03/10/2013  . Vaginal atrophy 03/09/2013  . Rectocele 03/09/2013  . Cystocele 03/09/2013  . Urinary incontinence 03/09/2013  . COPD (chronic obstructive pulmonary disease) ([place]) 01/16/2011  . CARCINOMA, LUNG, NONSMALL CELL 12/17/2010  . Rheumatoid arthritis ([place]) 12/17/2010  . Dyspnea on exertion 12/17/2010    Current Outpatient Medications on File Prior to Visit  Medication Sig Dispense Refill  . acetaminophen (TYLENOL) 325 MG tablet Take 650 mg by mouth every 6 (six) hours as needed for mild pain.     Marland Kitchen albuterol (PROAIR HFA) 108 (90 Base) MCG/ACT inhaler USE 1-2 PUFFS EVERY 4-6 HOURS AS NEEDED 8.5 g 5  . aspirin EC 81 MG tablet Take 81 mg by mouth daily.    . B-D TB SYRINGE 1CC/27GX1/2" 27G X 1/2" 1 ML MISC For use with methotrexate injection weekly 25 each 1  . budesonide-formoterol (SYMBICORT) 160-4.5 MCG/ACT inhaler USE 2 INHALATIONS ORALLY   TWICE DAILY 30.6 g 2  . clidinium-chlordiazePOXIDE (LIBRAX) 5-2.5 MG capsule SMARTSIG:1 Capsule(s) By Mouth Every 12 Hours PRN    . ergocalciferol (VITAMIN D2) 1.25 MG (50000  UT) capsule 1 capsule    . famotidine (PEPCID) 40 MG tablet TAKE 1 TABLET DAILY 90 tablet 3  . folic acid (FOLVITE) 1 MG tablet Take 1 tablet (1 mg total) by mouth daily. 90 tablet 1  . gabapentin (NEURONTIN) 100 MG capsule Take 1 capsule (100 mg total) by mouth 3 (three) times daily. 90 capsule 5  . glucose blood (ONETOUCH ULTRA) test strip Use to check blood sugars twice a day 100 each 5  . ibuprofen  (ADVIL,MOTRIN) 200 MG tablet Take 200 mg by mouth every 6 (six) hours as needed for headache or mild pain.    Marland Kitchen linaclotide (LINZESS) 72 MCG capsule Take 72 mcg by mouth daily as needed (constipation).     . meclizine (ANTIVERT) 25 MG tablet Take 0.5-1 tablets (12.5-25 mg total) by mouth 3 (three) times daily as needed for dizziness. 60 tablet 5  . methotrexate 50 MG/2ML injection Inject 0.7 mLs (17.5 mg total) into the skin once a week. 4 mL 2  . Methotrexate Sodium (METHOTREXATE, PF,) 50 MG/2ML injection     . mupirocin ointment (BACTROBAN) 2 % Place 1 application into the nose 2 (two) times daily. 22 g 1  . omeprazole (PRILOSEC) 40 MG capsule Take 40 mg by mouth daily.    [name] DELICA LANCETS 81E MISC USE TO CHECK SUGARS UP TO 4 TIMES A DAY  0  . Plecanatide (TRULANCE) 3 MG TABS as needed.    . sertraline (ZOLOFT) 100 MG tablet TAKE 1 TABLET BY MOUTH EVERY DAY 90 tablet 1  . SYNTHROID 88 MCG tablet TAKE 1 TABLET DAILY 90 tablet 1  . terconazole (TERAZOL 7) 0.4 % vaginal cream Place 1 applicator vaginally at bedtime. 45 g 0  . Tiotropium Bromide Monohydrate (SPIRIVA RESPIMAT) 2.5 MCG/ACT AERS Inhale 2 puffs into the lungs daily. 12 g 1  . traMADol (ULTRAM) 50 MG tablet Take 50 mg by mouth 3 (three) times daily as needed for moderate pain.   0  . triamcinolone ointment (KENALOG) 0.1 % Apply 1 application topically 2 (two) times daily as needed. 30 g 3  . Vitamin D, Cholecalciferol, 10 MCG (400 UNIT) CHEW Chew by mouth daily. Unsure of current dose     No current facility-administered medications on file prior to visit.    Past Medical History:  Diagnosis Date  . Anxiety   . Arthritis    RA  . BPV (benign positional vertigo) 03/10/2018  . Cancer ([place])    lung ca  . Carotid stenosis, left 09/19/2018  . COPD (chronic obstructive pulmonary disease) ([place])   . Depression   . Dyspnea    with exertion  . GERD (gastroesophageal reflux disease)   . Headache syndrome 03/09/2019  .  Hypercholesteremia   . Hypothyroidism   . Memory difficulties 10/28/2015  . Pneumonia     x3  last time 2017  . Pre-diabetes   . Rheumatoid arthritis(714.0)   . Thyroid disease   . Tremor 10/28/2015   Jaw tremor  . Uterine prolapse   . Vitamin D deficiency     Past Surgical History:  Procedure Laterality Date  . ANGIOPLASTY Left 10/10/2018   Procedure: ANGIOPLASTY using 1cm x 14cm Xenosure Patch;  Surgeon: Marty Heck, MD;  Location: [facility];  Service: Vascular;  Laterality: Left;  . APPENDECTOMY    . COLONOSCOPY W/ POLYPECTOMY    . ENDARTERECTOMY Left 10/10/2018   Procedure: ENDARTERECTOMY CAROTID;  Surgeon: Marty Heck, MD;  Location: [facility];  Service: Vascular;  Laterality: Left;  . EYE SURGERY Bilateral    cataract  . FOOT SURGERY Bilateral    arthritis  . Hands  Bilateral    Arthritis  . HEMORRHOID SURGERY    . right lobectomy  10/2004  . TONSILLECTOMY AND ADENOIDECTOMY      Social History   Socioeconomic History  . Marital status: Widowed    Spouse name: Not on file  . Number of children: 3  . Years of education: LPN  . Highest education level: Not on file  Occupational History  . Occupation: retired    Fish farm manager: RETIRED    Comment: LPN at [employer]  Tobacco Use  . Smoking status: Former Smoker    Packs/day: 1.00    Years: 20.00    Pack years: 20.00    Types: Cigarettes    Quit date: 11/10/2004    Years since quitting: 16.5  . Smokeless tobacco: Never Used  Vaping Use  . Vaping Use: Never used  Substance and Sexual Activity  . Alcohol use: Yes    Alcohol/week: 1.0 standard drink    Types: 1 Glasses of wine per week    Comment: socially  . Drug use: No  . Sexual activity: Not Currently  Other Topics Concern  . Not on file  Social History Narrative   Patient drinks about 3 cups of coffee daily.   Patient is right handed.    Social Determinants of Health   Financial Resource Strain: Low Risk   . Difficulty of  Paying Living Expenses: Not very hard  Food Insecurity: Not on file  Transportation Needs: Not on file  Physical Activity: Not on file  Stress: Not on file  Social Connections: Not on file    Family History  Problem Relation Age of Onset  . Emphysema Mother   . Asthma Mother   . Lung cancer Father   . Pancreatic cancer Father   . Bone cancer Father   . Heart disease Brother        x1  . Diabetes Brother   . Hypertension Brother   . Lupus Brother   . Heart disease Sister        x2  . Diabetes Sister   . Hypertension Sister   . Diabetes Sister   . Hypertension Sister   . Diabetes Brother   . Hypertension Brother   . Stroke Maternal Grandfather   . Hypertension Son   . Tremor Son     Review of Systems  Constitutional: Negative for fever.  Respiratory: Positive for shortness of breath. Negative for cough and wheezing.   Cardiovascular: Positive for chest pain (not new) and palpitations (oc). Negative for leg swelling.  Gastrointestinal: Positive for abdominal pain (right side) and constipation.  Neurological: Positive for light-headedness and headaches (posterior and top of head).       Objective:   Vitals:   06/02/21 1320  BP: 120/72  Pulse: 64  Temp: 98.1 F (36.7 C)  SpO2: 98%   BP Readings from Last 3 Encounters:  06/02/21 120/72  05/01/21 118/70  04/16/21 122/76   Wt Readings from Last 3 Encounters:  06/02/21 136 lb 9.6 oz (62 kg)  05/01/21 138 lb (62.6 kg)  04/02/21 138 lb (62.6 kg)   Body mass index is 24.98 kg/m.   Physical Exam    Constitutional: Appears well-developed and well-nourished. No distress.  HENT:  Head: Normocephalic and atraumatic.  Neck: Neck supple. No tracheal deviation present. No thyromegaly present.  No cervical lymphadenopathy Cardiovascular: Normal rate, regular rhythm and normal heart sounds.   No murmur heard. No carotid bruit .  No edema Pulmonary/Chest: Effort normal and breath sounds normal. No respiratory  distress. No has no wheezes. No rales.  Skin: Skin is warm and dry. Not diaphoretic.  Psychiatric: Normal mood and affect. Behavior is normal.      Assessment & Plan:    See Problem List for Assessment and Plan of chronic medical problems.    This visit occurred during the SARS-CoV-2 public health emergency.  Safety protocols were in place, including screening questions prior to the visit, additional usage of staff PPE, and extensive cleaning of exam room while observing appropriate contact time as indicated for disinfecting solutions.

## 2021-06-01 NOTE — Patient Instructions (Addendum)
    Blood work was ordered.     Medications changes include :  none   Your prescription(s) have been submitted to your pharmacy. Please take as directed and contact our office if you believe you are having problem(s) with the medication(s).   Please followup in 6 months  

## 2021-06-02 ENCOUNTER — Other Ambulatory Visit: Payer: Self-pay

## 2021-06-02 ENCOUNTER — Ambulatory Visit (INDEPENDENT_AMBULATORY_CARE_PROVIDER_SITE_OTHER): Payer: Medicare Other | Admitting: Internal Medicine

## 2021-06-02 VITALS — BP 120/72 | HR 64 | Temp 98.1°F | Ht 62.0 in | Wt 136.6 lb

## 2021-06-02 DIAGNOSIS — K219 Gastro-esophageal reflux disease without esophagitis: Secondary | ICD-10-CM | POA: Diagnosis not present

## 2021-06-02 DIAGNOSIS — F3289 Other specified depressive episodes: Secondary | ICD-10-CM | POA: Diagnosis not present

## 2021-06-02 DIAGNOSIS — G4489 Other headache syndrome: Secondary | ICD-10-CM | POA: Diagnosis not present

## 2021-06-02 DIAGNOSIS — F418 Other specified anxiety disorders: Secondary | ICD-10-CM

## 2021-06-02 DIAGNOSIS — E038 Other specified hypothyroidism: Secondary | ICD-10-CM | POA: Diagnosis not present

## 2021-06-02 DIAGNOSIS — F411 Generalized anxiety disorder: Secondary | ICD-10-CM | POA: Diagnosis not present

## 2021-06-02 DIAGNOSIS — I6523 Occlusion and stenosis of bilateral carotid arteries: Secondary | ICD-10-CM | POA: Diagnosis not present

## 2021-06-02 DIAGNOSIS — E119 Type 2 diabetes mellitus without complications: Secondary | ICD-10-CM | POA: Diagnosis not present

## 2021-06-02 LAB — TSH: TSH: 0.34 u[IU]/mL — ABNORMAL LOW (ref 0.35–4.50)

## 2021-06-02 LAB — MICROALBUMIN / CREATININE URINE RATIO
Creatinine,U: 136.4 mg/dL
Microalb Creat Ratio: 1.6 mg/g (ref 0.0–30.0)
Microalb, Ur: 2.2 mg/dL — ABNORMAL HIGH (ref 0.0–1.9)

## 2021-06-02 LAB — HEMOGLOBIN A1C: Hgb A1c MFr Bld: 5.7 % (ref 4.6–6.5)

## 2021-06-02 MED ORDER — ALPRAZOLAM 0.25 MG PO TABS: ORAL_TABLET | ORAL | 0 refills | Status: DC

## 2021-06-02 MED ORDER — FLUCONAZOLE 150 MG PO TABS: 150.0000 mg | ORAL_TABLET | Freq: Once | ORAL | 0 refills | Status: AC

## 2021-06-02 NOTE — Assessment & Plan Note (Signed)
Chronic GERD controlled Continue omeprazole 40 mg daily, pepcid 40 mg daily

## 2021-06-02 NOTE — Assessment & Plan Note (Signed)
Chronic  Clinically euthyroid Currently taking synthroid 88 mcg Check tsh  Titrate med dose if needed

## 2021-06-02 NOTE — Assessment & Plan Note (Addendum)
Chronic Controlled, stable Continue sertraline 100 mg daily and xanax 0.25 mg daily prn

## 2021-06-02 NOTE — Assessment & Plan Note (Signed)
Chronic Controlled, stable Continue  sertraline 100 mg daily

## 2021-06-02 NOTE — Assessment & Plan Note (Signed)
She is experiencing posterior headaches and headaches on the top of her head-often a burning sensation Likely related to her cervical spine arthritis Discussed possibly trying a higher dose of the gabapentin, but we are both concerned about side effects for now she will continue her current dose Will discuss with Dr. Jannifer Franklin at her next appointment

## 2021-06-02 NOTE — Assessment & Plan Note (Signed)
Chronic Diet controlled Check a1c

## 2021-06-03 ENCOUNTER — Other Ambulatory Visit: Payer: Self-pay | Admitting: Otolaryngology

## 2021-06-03 DIAGNOSIS — R1314 Dysphagia, pharyngoesophageal phase: Secondary | ICD-10-CM

## 2021-06-03 DIAGNOSIS — K219 Gastro-esophageal reflux disease without esophagitis: Secondary | ICD-10-CM

## 2021-06-03 LAB — COMPREHENSIVE METABOLIC PANEL
ALT: 12 U/L (ref 0–35)
AST: 27 U/L (ref 0–37)
Albumin: 3.7 g/dL (ref 3.5–5.2)
Alkaline Phosphatase: 86 U/L (ref 39–117)
BUN: 10 mg/dL (ref 6–23)
CO2: 27 mEq/L (ref 19–32)
Calcium: 9 mg/dL (ref 8.4–10.5)
Chloride: 103 mEq/L (ref 96–112)
Creatinine, Ser: 0.97 mg/dL (ref 0.40–1.20)
GFR: 52.84 mL/min — ABNORMAL LOW (ref >60.00)
Glucose, Bld: 70 mg/dL (ref 70–99)
Potassium: 3.8 mEq/L (ref 3.5–5.1)
Sodium: 140 mEq/L (ref 135–145)
Total Bilirubin: 0.5 mg/dL (ref 0.2–1.2)
Total Protein: 6.8 g/dL (ref 6.0–8.3)

## 2021-06-03 LAB — LIPID PANEL
Cholesterol: 195 mg/dL (ref 0–200)
HDL: 58.3 mg/dL (ref >39.00)
LDL Cholesterol: 112 mg/dL — ABNORMAL HIGH (ref 0–99)
NonHDL: 137.14
Total CHOL/HDL Ratio: 3
Triglycerides: 124 mg/dL (ref 0.0–149.0)
VLDL: 24.8 mg/dL (ref 0.0–40.0)

## 2021-06-24 NOTE — Progress Notes (Signed)
Office Visit Note  Patient: Lisa Vega             Date of Birth: 01-Aug-1932           MRN: [id number]             PCP: Binnie Rail, MD Referring: Binnie Rail, MD Visit Date: 06/25/2021   Subjective:  Follow-up (Patient is doing injectable MTX and doing well. )   History of Present Illness: Lisa Vega is a 87 y.o. female here for follow up for longstanding erosive rheumatoid arthritis on methotrexate 17.5 mg subcu weekly.  Since her last visit MRI of the left hip was obtained demonstrating nondisplaced fracture that was managed nonoperatively.  She has noticed some increase joint pain symptoms in the left knee as well which has been a problem for her in the past and sees orthopedist Dr. Herma Mering for intra-articular injections that have been beneficial previously.  She has noticed a mild not particularly painful nodule on the left finger but otherwise denies swelling or discoloration on her hand joints.   Previous HPI: 12/06/20 History of Present Illness: Lisa Vega is a 87 y.o. female with a history of non-small cell lung cancer status post surgical resection and chemotherapy, hypothyroidism, osteoporosis, and COPD here to establish care for rheumatoid arthritis currently treated with methotrexate subcutaneous 17.5 mg weekly.  She was originally diagnosed around 40 years ago in [place] she to treatment with gold injections that were not effective.  She later treated with oral methotrexate that was poorly tolerated with rashes at even low-dose.  She was switched to subcutaneous methotrexate which she has been taking for at least 20 years.  Since moving to this area she is seeing rheumatology care with Dr. Justine Null, Dr. Rockwell Alexandria, Dr. Trudie Reed, and now seeks to transfer care for an office that is closer to her daughter's home where she has started living due to increased difficulties with independent living and commuting.   She has extensive long-term damage from her rheumatoid  arthritis with subluxation deviation and deformity of the digits on both hands and both feet.  She is also had some involvement of the shoulders and wrists but not as severe.  She has degenerative back disease, cervical disc disease, and bilateral osteoarthritis of the knees.  The trouble with her knees especially with ulcer and deformity of the right foot significantly limit her mobility at this time.  She has not noticed a lot of joint swelling redness or heat recently and has not required recent steroid treatments.  She has had some benefit with intra-articular injections for the knees also had some injections for the neck and upper back with mixed results.  She also describes some nodules in the skin of her upper arms and on her hands that she thinks are more noticeable over the past several months.   She has a history of non-small cell lung cancer that was found with a rapidly enlarging left lung nodule.  She was a former cigarette smoker with COPD but no longer.  She denies a known history of interstitial lung disease but has discussed difficulty say whether methotrexate has contributed a lung toxicity versus just her RA.     Review of Systems  Constitutional:  Positive for fatigue.  HENT:  Positive for mouth dryness and nose dryness. Negative for mouth sores.   Eyes:  Positive for pain, visual disturbance and dryness. Negative for itching.  Respiratory:  Positive for shortness of breath and  difficulty breathing. Negative for cough and hemoptysis.   Cardiovascular:  Positive for chest pain and palpitations. Negative for swelling in legs/feet.  Gastrointestinal:  Positive for abdominal pain and constipation. Negative for blood in stool and diarrhea.  Endocrine: Negative for increased urination.  Genitourinary:  Negative for painful urination.  Musculoskeletal:  Positive for joint pain, joint pain, joint swelling, myalgias, muscle weakness, morning stiffness, muscle tenderness and myalgias.   Skin:  Negative for color change, rash and redness.  Allergic/Immunologic: Negative for susceptible to infections.  Neurological:  Positive for dizziness, headaches and weakness. Negative for numbness and memory loss.  Hematological:  Positive for swollen glands.  Psychiatric/Behavioral:  Positive for sleep disturbance. Negative for confusion.    PMFS History:  Patient Active Problem List   Diagnosis Date Noted   Arthralgia of lower leg 02/24/2021   On long term drug therapy 02/24/2021   Myalgia 02/24/2021   Anxiety state 02/24/2021   Diverticular disease of colon 02/24/2021   Dysphagia 02/24/2021   Essential tremor 02/24/2021   Flatulence, eructation and gas pain 02/24/2021   History of colonic polyps 02/24/2021   Mixed hyperlipidemia 02/24/2021   Pure hypercholesterolemia 02/24/2021   Vitamin B12 deficiency 02/24/2021   Vitamin D deficiency 02/24/2021   Slow transit constipation 02/24/2021   COVID-19 01/28/2021   Headache, cervicogenic 01/03/2021   High risk medication use 12/06/2020   Rosacea 11/29/2020   Cervical spondylosis 04/18/2020   RUQ pain 08/01/2019   Primary osteoarthritis of first carpometacarpal joint of right hand 05/10/2019   Headache syndrome 03/09/2019   Carotid stenosis, left 09/19/2018   Osteoarthritis of left knee 05/12/2018   Benign positional vertigo 03/10/2018   History of lung cancer 11/29/2017   Difficulty sleeping 09/22/2017   Diabetes ([place]) 05/13/2017   Fatigue 05/12/2017   CAP (community acquired pneumonia) 05/03/2017   Orthopnea 01/12/2017   GERD (gastroesophageal reflux disease) 11/09/2016   Osteoporosis 05/06/2016   Depression 05/06/2016   Hiatal hernia 05/06/2016   Hypothyroidism 05/06/2016   Tremor 10/28/2015   Memory difficulties 10/28/2015   Hoarseness 10/14/2015   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 ([place]) 05/30/2014   Diverticulitis of colon without hemorrhage 08/07/2013   Prolapse of female pelvic organs 06/22/2013    Neck pain 03/10/2013   Vaginal atrophy 03/09/2013   Rectocele 03/09/2013   Cystocele 03/09/2013   Urinary incontinence 03/09/2013   COPD (chronic obstructive pulmonary disease) ([place]) 01/16/2011   CARCINOMA, LUNG, NONSMALL CELL 12/17/2010   Rheumatoid arthritis ([place]) 12/17/2010   Dyspnea on exertion 12/17/2010    Past Medical History:  Diagnosis Date   Anxiety    Arthritis    RA   BPV (benign positional vertigo) 03/10/2018   Cancer ([place])    lung ca   Carotid stenosis, left 09/19/2018   COPD (chronic obstructive pulmonary disease) ([facility])    Depression    Dyspnea    with exertion   GERD (gastroesophageal reflux disease)    Headache syndrome 03/09/2019   Hypercholesteremia    Hypothyroidism    Memory difficulties 10/28/2015   Pneumonia     x3  last time 2017   Pre-diabetes    Rheumatoid arthritis(714.0)    Thyroid disease    Tremor 10/28/2015   Jaw tremor   Uterine prolapse    Vitamin D deficiency     Family History  Problem Relation Age of Onset   Emphysema Mother    Asthma Mother    Lung cancer Father    Pancreatic cancer Father  Bone cancer Father    Heart disease Brother        x1   Diabetes Brother    Hypertension Brother    Lupus Brother    Heart disease Sister        x2   Diabetes Sister    Hypertension Sister    Diabetes Sister    Hypertension Sister    Diabetes Brother    Hypertension Brother    Stroke Maternal Grandfather    Hypertension Son    Tremor Son    Past Surgical History:  Procedure Laterality Date   ANGIOPLASTY Left 10/10/2018   Procedure: ANGIOPLASTY using 1cm x 14cm Xenosure Patch;  Surgeon: Marty Heck, MD;  Location: [facility] OR;  Service: Vascular;  Laterality: Left;   APPENDECTOMY     COLONOSCOPY W/ POLYPECTOMY     ENDARTERECTOMY Left 10/10/2018   Procedure: ENDARTERECTOMY CAROTID;  Surgeon: Marty Heck, MD;  Location: [facility] OR;  Service: Vascular;  Laterality: Left;   EYE SURGERY Bilateral    cataract   FOOT  SURGERY Bilateral    arthritis   Hands  Bilateral    Arthritis   HEMORRHOID SURGERY     right lobectomy  10/2004   TONSILLECTOMY AND ADENOIDECTOMY     Social History   Social History Narrative   Patient drinks about 3 cups of coffee daily.   Patient is right handed.    Immunization History  Administered Date(s) Administered   Fluad Quad(high Dose 65+) 12/08/2019, 10/31/2020   Influenza Split 10/18/2009, 09/28/2011, 10/28/2011, 09/28/2015, 10/09/2015   Influenza Whole 09/27/2012   Influenza, High Dose Seasonal PF 10/28/2016, 01/12/2017, 09/22/2017, 10/03/2018   Influenza,inj,Quad PF,6+ Mos 09/20/2013, 09/12/2014   Pneumococcal Conjugate-13 10/24/2015, 10/31/2020   Pneumococcal Polysaccharide-23 12/29/2007, 07/10/2008     Objective: Vital Signs: BP 126/72 (BP Location: Left Arm, Patient Position: Sitting, Cuff Size: Normal)   Pulse 63   Ht 5\' 2"  (1.575 m)   Wt 137 lb 6.4 oz (62.3 kg)   BMI 25.13 kg/m    Physical Exam Skin:    General: Skin is warm and dry.     Comments: Small scaly erythematous rash on left index finger with central puncture mark  Neurological:     General: No focal deficit present.     Mental Status: She is alert.  Psychiatric:        Mood and Affect: Mood normal.     Musculoskeletal Exam:  Shoulders full ROM, b/l tenderness overhead Elbows full ROM no tenderness or swelling Wrists full ROM no tenderness or swelling Fingers multiple bony nodules and MCP joint subluxation and swan neck deformity Left lateral hip tenderness to palpation, no pain with internal or external rotation Knees full ROM, bilateral patellofemoral crepitus, mild tenderness with range of motion   CDAI Exam: CDAI Score: 6  Patient Global: 20 mm; Provider Global: 20 mm Swollen: 0 ; Tender: 3  Joint Exam 06/25/2021      Right  Left  DIP 2      Tender  Knee   Tender   Tender     Investigation: No additional findings.  Imaging: No results found.  Recent Labs: Lab  Results  Component Value Date   WBC 4.8 03/26/2021   HGB 12.7 03/26/2021   PLT 220 03/26/2021   NA 140 06/02/2021   K 3.8 06/02/2021   CL 103 06/02/2021   CO2 27 06/02/2021   GLUCOSE 70 06/02/2021   BUN 10 06/02/2021   CREATININE 0.97 06/02/2021  BILITOT 0.5 06/02/2021   ALKPHOS 86 06/02/2021   AST 27 06/02/2021   ALT 12 06/02/2021   PROT 6.8 06/02/2021   ALBUMIN 3.7 06/02/2021   CALCIUM 9.0 06/02/2021   GFRAA 70 03/26/2021    Speciality Comments: No specialty comments available.  Procedures:  No procedures performed Allergies: Codeine, Demerol, Meperidine hcl, Meperidine hcl, Other, and Sulfa antibiotics   Assessment / Plan:     Visit Diagnoses: Rheumatoid arthritis involving multiple sites, unspecified whether rheumatoid factor present ([place]) - Plan: Sedimentation rate, folic acid (FOLVITE) 1 MG tablet, methotrexate 50 MG/2ML injection  Inflammatory arthritis looks pretty well controlled her current joint pain is most noticeable in the knees and probable secondary to osteoarthritis.  She is considered a poor surgical candidate for these due to age and other conditions so following up intermittently with Ortho.  Plan to continue methotrexate 17.5 mg subcu weekly and folic acid 1 mg daily.  We will check sedimentation rate for any evidence of systemic disease activity.  Age-related osteoporosis with current pathological fracture  She did have a left hip fracture with a recent fall previously was on Fosamax currently off pharmacotherapy.  Discussed could consider referral to physical therapy for gait and balance training recommended she can discuss this also with Dr. Herma Mering probably after follow-up and possible knee injection.  High risk medication use - Plan: CBC with Differential/Platelet, COMPLETE METABOLIC PANEL WITH GFR  Monitoring for methotrexate toxicity checking CBC and CMP today.  Orders: Orders Placed This Encounter  Procedures   CBC with Differential/Platelet    COMPLETE METABOLIC PANEL WITH GFR   Sedimentation rate    Meds ordered this encounter  Medications   folic acid (FOLVITE) 1 MG tablet    Sig: Take 1 tablet (1 mg total) by mouth daily.    Dispense:  90 tablet    Refill:  1   methotrexate 50 MG/2ML injection    Sig: Inject 0.7 mLs (17.5 mg total) into the skin once a week.    Dispense:  4 mL    Refill:  2    DDI acknowledged, okay to take alongside PPI she has been stable on current dosing combination      Follow-Up Instructions: Return in about 3 months (around 09/25/2021) for RA on MTX f/u 54mos.   Collier Salina, MD  Note - This record has been created using [employer].  Chart creation errors have been sought, but may not always  have been located. Such creation errors do not reflect on  the standard of medical care.

## 2021-06-25 ENCOUNTER — Other Ambulatory Visit: Payer: Self-pay

## 2021-06-25 ENCOUNTER — Encounter: Payer: Self-pay | Admitting: Internal Medicine

## 2021-06-25 ENCOUNTER — Ambulatory Visit (INDEPENDENT_AMBULATORY_CARE_PROVIDER_SITE_OTHER): Payer: Medicare Other | Admitting: Internal Medicine

## 2021-06-25 VITALS — BP 126/72 | HR 63 | Ht 62.0 in | Wt 137.4 lb

## 2021-06-25 DIAGNOSIS — M069 Rheumatoid arthritis, unspecified: Secondary | ICD-10-CM

## 2021-06-25 DIAGNOSIS — M810 Age-related osteoporosis without current pathological fracture: Secondary | ICD-10-CM | POA: Diagnosis not present

## 2021-06-25 DIAGNOSIS — Z79899 Other long term (current) drug therapy: Secondary | ICD-10-CM

## 2021-06-25 MED ORDER — METHOTREXATE SODIUM CHEMO INJECTION 50 MG/2ML: 17.5000 mg | INTRAMUSCULAR | 2 refills | Status: DC

## 2021-06-25 MED ORDER — FOLIC ACID 1 MG PO TABS: 1.0000 mg | ORAL_TABLET | Freq: Every day | ORAL | 1 refills | Status: DC

## 2021-06-26 LAB — CBC WITH DIFFERENTIAL/PLATELET
Absolute Monocytes: 688 cells/uL (ref 200–950)
Basophils Absolute: 72 cells/uL (ref 0–200)
Basophils Relative: 1.3 %
Eosinophils Absolute: 149 cells/uL (ref 15–500)
Eosinophils Relative: 2.7 %
HCT: 39.9 % (ref 35.0–45.0)
Hemoglobin: 12.9 g/dL (ref 11.7–15.5)
Lymphs Abs: 2987 cells/uL (ref 850–3900)
MCH: 32.3 pg (ref 27.0–33.0)
MCHC: 32.3 g/dL (ref 32.0–36.0)
MCV: 99.8 fL (ref 80.0–100.0)
MPV: 10.7 fL (ref 7.5–12.5)
Monocytes Relative: 12.5 %
Neutro Abs: 1606 cells/uL (ref 1500–7800)
Neutrophils Relative %: 29.2 %
Platelets: 129 10*3/uL — ABNORMAL LOW (ref 140–400)
RBC: 4 10*6/uL (ref 3.80–5.10)
RDW: 14.2 % (ref 11.0–15.0)
Total Lymphocyte: 54.3 %
WBC: 5.5 10*3/uL (ref 3.8–10.8)

## 2021-06-26 LAB — COMPLETE METABOLIC PANEL WITH GFR
AG Ratio: 1.2 (calc) (ref 1.0–2.5)
ALT: 13 U/L (ref 6–29)
AST: 29 U/L (ref 10–35)
Albumin: 3.8 g/dL (ref 3.6–5.1)
Alkaline phosphatase (APISO): 86 U/L (ref 37–153)
BUN/Creatinine Ratio: 13 (calc) (ref 6–22)
BUN: 12 mg/dL (ref 7–25)
CO2: 31 mmol/L (ref 20–32)
Calcium: 9.2 mg/dL (ref 8.6–10.4)
Chloride: 101 mmol/L (ref 98–110)
Creat: 0.95 mg/dL — ABNORMAL HIGH (ref 0.60–0.88)
GFR, Est African American: 63 mL/min/{1.73_m2} (ref >=60)
GFR, Est Non African American: 54 mL/min/{1.73_m2} — ABNORMAL LOW (ref >=60)
Globulin: 3.1 g/dL (calc) (ref 1.9–3.7)
Glucose, Bld: 91 mg/dL (ref 65–99)
Potassium: 4.6 mmol/L (ref 3.5–5.3)
Sodium: 138 mmol/L (ref 135–146)
Total Bilirubin: 0.7 mg/dL (ref 0.2–1.2)
Total Protein: 6.9 g/dL (ref 6.1–8.1)

## 2021-06-26 LAB — SEDIMENTATION RATE: Sed Rate: 28 mm/h (ref 0–30)

## 2021-06-26 NOTE — Progress Notes (Signed)
Labs look okay for methotrexate to continue as planned. Platelet count is slightly decreased but not low enough to cause any problem and this is not usually related to the medication.

## 2021-07-02 ENCOUNTER — Other Ambulatory Visit: Payer: Self-pay

## 2021-07-02 ENCOUNTER — Encounter: Payer: Self-pay | Admitting: Nurse Practitioner

## 2021-07-02 ENCOUNTER — Ambulatory Visit (INDEPENDENT_AMBULATORY_CARE_PROVIDER_SITE_OTHER): Payer: Medicare Other | Admitting: Nurse Practitioner

## 2021-07-02 VITALS — BP 124/82

## 2021-07-02 DIAGNOSIS — B3731 Acute candidiasis of vulva and vagina: Secondary | ICD-10-CM

## 2021-07-02 DIAGNOSIS — N8189 Other female genital prolapse: Secondary | ICD-10-CM

## 2021-07-02 DIAGNOSIS — Z4689 Encounter for fitting and adjustment of other specified devices: Secondary | ICD-10-CM

## 2021-07-02 DIAGNOSIS — R300 Dysuria: Secondary | ICD-10-CM

## 2021-07-02 DIAGNOSIS — B373 Candidiasis of vulva and vagina: Secondary | ICD-10-CM | POA: Diagnosis not present

## 2021-07-02 LAB — WET PREP FOR TRICH, YEAST, CLUE

## 2021-07-02 MED ORDER — FLUCONAZOLE 150 MG PO TABS
150.0000 mg | ORAL_TABLET | ORAL | 0 refills | Status: DC
Start: 2021-07-02 — End: 2021-08-13

## 2021-07-02 NOTE — Progress Notes (Signed)
   Acute Office Visit  Subjective:    Patient ID: Lisa Vega, female    DOB: [birth year], 87 y.o.   MRN: [id number]   HPI 87 y.o. presents today for pessary maintenance. She is complaining of slight burning with urination and discharge. Since switching to larger pessary she has noticed difficulty with defecating and feels it is putting pressure on her rectum. She brought her old pessary today with hopes to replace with that one. She is having minimal urinary symptoms and having good support with current device.    Review of Systems  Constitutional: Negative.   Genitourinary:  Positive for dysuria and vaginal discharge. Negative for frequency, hematuria, pelvic pain and urgency.      Objective:    Physical Exam Constitutional:      Appearance: Normal appearance.  Genitourinary:    General: Normal vulva.     Vagina: Vaginal discharge present. No erythema or tenderness.    BP 124/82  Wt Readings from Last 3 Encounters:  06/25/21 137 lb 6.4 oz (62.3 kg)  06/02/21 136 lb 9.6 oz (62 kg)  05/01/21 138 lb (62.6 kg)   Wet prep + yeast UA: 1+ leukocytes, nitrate negative, trace blood, SG 1.015, dark yellow/cloudy. Microscopic: wbc 6-10, rbc 0-2, many bacteria, few yeast     Assessment & Plan:   Problem List Items Addressed This Visit   None Visit Diagnoses     Pessary maintenance    -  Primary   Pelvic relaxation       Burning with urination       Relevant Orders   Urinalysis,Complete w/RFL Culture   WET PREP FOR TRICH, YEAST, CLUE   Vaginal candidiasis       Relevant Medications   fluconazole (DIFLUCAN) 150 MG tablet      Plan: Pessary removed with difficulty and we agree to switch back to previous device due to difficulty with bowel movements and potential trauma that could come from removal. Wet prep positive for yeast - Diflucan 150 mg today and repeat in 3 days for total of 2 doses. Mild pyuria could be from vaginal contamination. Will wait to treat until culture  comes back. She will return in 3 months for pessary maintenance or sooner if needed.     Tamela Gammon DNP, 12:33 PM 07/02/2021

## 2021-07-04 ENCOUNTER — Other Ambulatory Visit: Payer: Self-pay

## 2021-07-04 ENCOUNTER — Other Ambulatory Visit: Payer: Self-pay | Admitting: Otolaryngology

## 2021-07-04 ENCOUNTER — Ambulatory Visit
Admission: RE | Admit: 2021-07-04 | Discharge: 2021-07-04 | Disposition: A | Discharge status: Home / Self Care | Payer: Medicare Other | Source: Ambulatory Visit | Attending: Otolaryngology | Admitting: Otolaryngology

## 2021-07-04 DIAGNOSIS — K219 Gastro-esophageal reflux disease without esophagitis: Secondary | ICD-10-CM

## 2021-07-04 DIAGNOSIS — K449 Diaphragmatic hernia without obstruction or gangrene: Secondary | ICD-10-CM | POA: Diagnosis not present

## 2021-07-04 DIAGNOSIS — R1310 Dysphagia, unspecified: Secondary | ICD-10-CM | POA: Diagnosis not present

## 2021-07-04 DIAGNOSIS — R1314 Dysphagia, pharyngoesophageal phase: Secondary | ICD-10-CM

## 2021-07-04 LAB — URINALYSIS, COMPLETE W/RFL CULTURE
Bilirubin Urine: NEGATIVE
Glucose, UA: NEGATIVE
Hyaline Cast: NONE SEEN /LPF
Ketones, ur: NEGATIVE
Nitrites, Initial: NEGATIVE
Protein, ur: NEGATIVE
Specific Gravity, Urine: 1.015 (ref 1.001–1.035)
pH: 5.5 (ref 5.0–8.0)

## 2021-07-04 LAB — URINE CULTURE
MICRO NUMBER:: 12086634
SPECIMEN QUALITY:: ADEQUATE

## 2021-07-04 LAB — CULTURE INDICATED

## 2021-07-15 ENCOUNTER — Ambulatory Visit: Payer: Medicare Other | Admitting: Nurse Practitioner

## 2021-07-16 ENCOUNTER — Telehealth: Payer: Self-pay | Admitting: Pharmacist

## 2021-07-16 NOTE — Chronic Care Management (AMB) (Signed)
Chronic Care Management Pharmacy Assistant   Name: Lisa Vega  MRN: [id number] DOB: 07/20/1932   Reason for Encounter: [place] office visits:  06/02/21 Billey Gosling, MD (PCP) - GERD - Blood work ordered, no medicine changes. Follow up in 6 months.  01/28/21 Billey Gosling, MD (PCP) - Video Visit - COVID 19 - no medication changes. Follow up as needed.   01/06/21 Billey Gosling, MD (PCP) - Traumatic head injury - CT head ordered, no medications changes, follow up as needed   01/03/21 Billey Gosling, MD (PCP) - Vertigo - Cervical spine Xray ordered, No medication changes, follow up as scheduled in 1 month with Neurology.   11/29/21 Billey Gosling, [facility] (PCP) - Rheumatoid Arthritis - Metrogel 1% prescribed, Diflucan prescribed for yeast infection, Referral placed for Rheumatology, follow up in 6 months.   Recent consult visits:  07/02/21 - Marny Lowenstein, NP - GYN - Pessary Maintenance - lab ordered, Fluconazole 150 mg every 3 days, Follow up in 3 months.   06/25/21 Vernelle Emerald, MD - Rheumatology - Rheumatoid Arthritis - labs ordered, Methotrexate 17.5 mg  SQ weekly prescribed, Continue Folic Acid 1 mg daily, follow up in 3 months.   05/29/21 Avel Peace, MD - ENT - Laryngopharyngeal hoarseness - Barium Swallow Study ordered, D/C Prilosec and Vitamin D as patient reports not taking. Follow up not indicated.   05/01/21 Karma Ganja, NP  - GYN - Vaginal Atrophy - Terconazole 0.4% cream prescribed, Diflucan 150 mg prescribed, labs ordered, follow up in 3 months.   05/01/21 Guido Sander, PT -Outpatient Rehab - Vertigo - discussed Laruth Bouchard- Daroff exercises for self treatment and Epley maneuver for self treatment prn should BPPV re-occur. Follow up not indicated.  04/30/21 Domingo Mend, MD - Eye doctor - peripalpebral Cyst Excision - cyst removal performed without complication. Follow up in 3 months.  04/16/21 - Marny Lowenstein, NP - GYN - Acute Cystitis with Hematuria- labs  ordered, Uine culture performed - No follow up indicated.  04/15/21 - Vinnie Level Dilday, Outpatient Rehab - Unsteadiness on feet - Plan: PT plan of care cert/re-cert - Follow up as scheduled.  04/02/21 Durene Cal, MD - GYN - Pelvic relaxation - labs ordered,  Diflucan 150 mg prescribed, follow up in 3 months.   03/28/21 Laurence Slate, PA-C - Cardiology - No medication changes, Follow up in 1 year.   03/26/21 Vernelle Emerald, MD - Rheumatology - labs ordered, no medication changes, follow up in 3 months.   02/14/21 April, Nonato, PT - Outpatient Rehab - Vertigo - no medication changes, follow up as scheduled.  02/06/21 Butler Denmark, NP - Neurology -  Headache Syndrome - Changed Gabapentin to 100 mg three times daily, prescription given, Continue with Outpatient Rehab, Follow up in 6 months.   02/05/21 Marilynne Halsted, MD - Eye Doctor - Pseudophakia of both eyes - Excision scheduled for both eyes, Refraction in clinic today. Glasses prescription dispensed, follow up in 1 month for procedure,  02/04/21 Marny Lowenstein, NP - GYN - Fitting for Pessary - labs ordered, Diflucan 150 mg prescribed, follow up for new device insertion as scheduled.   12/23/20 Marny Lowenstein, NP  - GYN - Acute cystitis without hematuria - labs ordered, Macrobid 100 mg for 7 days prescribed, Diflucan 150 mg prescribed, Follow up as needed.   12/06/2020 Vernelle Emerald, MD - Rheumatology - Rheumatoid Arthritis - labs ordered, increase Vit D to 2000 IU daily, Follow up in 3 months.  11/19/20 Monica Martinez, MD - Vascular Surgery - Carotid Stenosis - no medication changes. Follow up in 1 year.   11/15/21 Suella Broad - Physical Medicine and Rehab - Carvical Disc Degeneration - no noted available  11/14/20 Leafy Kindle - Emergency Medicine - Rheumatoid Arthritis - no notes available.  10/31/20 Sherrilyn Rist, MD - Pulmonology - Need for Flu Vaccine - Influenza and Pneumonia vaccine administered, no medication changes,  Follow up in 6 months.   10/08/20 Marny Lowenstein, NP - GYN - Pessary Maintenance - labs ordered, Sulfamethizole-Trimethoprim 800-160 mg prescribes, Diflucan prescribed, Follow up in 3 months.   09/13/20 Wyn Quaker, NP - Pulmonology - COPD acute exacerbation - CT chest and Chest Xray ordered, Amoxicillin-Pot Clavulanate 875-125 mg prescribed, Prednisone taper prescribed, Follow up in 4 weeks.   Hospital visits:  None in previous 6 months  Medications: Outpatient Encounter Medications as of 07/16/2021  Medication Sig   acetaminophen (TYLENOL) 325 MG tablet Take 650 mg by mouth every 6 (six) hours as needed for mild pain.    albuterol (PROAIR HFA) 108 (90 Base) MCG/ACT inhaler USE 1-2 PUFFS EVERY 4-6 HOURS AS NEEDED   ALPRAZolam (XANAX) 0.25 MG tablet TAKE 1 TABLET BY MOUTH EVERY DAY AS NEEDED FOR ANXIETY   aspirin EC 81 MG tablet Take 81 mg by mouth daily.   B-D TB SYRINGE 1CC/27GX1/2" 27G X 1/2" 1 ML MISC For use with methotrexate injection weekly   budesonide-formoterol (SYMBICORT) 160-4.5 MCG/ACT inhaler USE 2 INHALATIONS ORALLY   TWICE DAILY   clidinium-chlordiazePOXIDE (LIBRAX) 5-2.5 MG capsule SMARTSIG:1 Capsule(s) By Mouth Every 12 Hours PRN   famotidine (PEPCID) 40 MG tablet TAKE 1 TABLET DAILY   fluconazole (DIFLUCAN) 150 MG tablet Take 1 tablet (150 mg total) by mouth every 3 (three) days.   folic acid (FOLVITE) 1 MG tablet Take 1 tablet (1 mg total) by mouth daily.   gabapentin (NEURONTIN) 100 MG capsule Take 1 capsule (100 mg total) by mouth 3 (three) times daily.   glucose blood (ONETOUCH ULTRA) test strip Use to check blood sugars twice a day   ibuprofen (ADVIL,MOTRIN) 200 MG tablet Take 200 mg by mouth every 6 (six) hours as needed for headache or mild pain.   meclizine (ANTIVERT) 25 MG tablet Take 0.5-1 tablets (12.5-25 mg total) by mouth 3 (three) times daily as needed for dizziness.   methotrexate 50 MG/2ML injection Inject 0.7 mLs (17.5 mg total) into the skin once a week.    mupirocin ointment (BACTROBAN) 2 % Place 1 application into the nose 2 (two) times daily.   omeprazole (PRILOSEC) 40 MG capsule Take 40 mg by mouth daily.   ONETOUCH DELICA LANCETS 78I MISC USE TO CHECK SUGARS UP TO 4 TIMES A DAY   Plecanatide (TRULANCE) 3 MG TABS as needed.   sertraline (ZOLOFT) 100 MG tablet TAKE 1 TABLET BY MOUTH EVERY DAY   SYNTHROID 88 MCG tablet TAKE 1 TABLET DAILY   terconazole (TERAZOL 7) 0.4 % vaginal cream Place 1 applicator vaginally at bedtime. (Patient not taking: Reported on 07/02/2021)   Tiotropium Bromide Monohydrate (SPIRIVA RESPIMAT) 2.5 MCG/ACT AERS Inhale 2 puffs into the lungs daily.   traMADol (ULTRAM) 50 MG tablet Take 50 mg by mouth 3 (three) times daily as needed for moderate pain.    triamcinolone ointment (KENALOG) 0.1 % Apply 1 application topically 2 (two) times daily as needed.   Vitamin D, Cholecalciferol, 10 MCG (400 UNIT) CHEW Chew by mouth daily. Unsure of current dose   No  facility-administered encounter medications on file as of 07/16/2021.    Have you had any problems recently with your health? Patient she has been having trouble with her feet and has been waiting on an appointment with a specialist.  She feels it is related to her Rheumatoid Arthritis. She also has a history of callus removals on both feet. She is waiting for insurance to approve her knee injection. She is still active and just came back from a beach vacation with her family.   Have you had any problems with your pharmacy? Patient denies having any issues getting her medications.   What issues or side effects are you having with your medications? Patient denies any current issues or side effects with her current medications.   What would you like me to pass along to [facility], CPP for  them to help you with?  Patient has no concerns at this time.   What can we do to take care of you better? Patient has no concerns at this time.  Patient needs a to be  scheduled for a follow up visit with Mendel Ryder, [place].  Star Rating Drugs:  none  Orinda Kenner, RMA Clinical Pharmacists Assistant [phone]  Time Spent:  80 minutes

## 2021-08-02 DIAGNOSIS — M25562 Pain in left knee: Secondary | ICD-10-CM | POA: Diagnosis not present

## 2021-08-04 ENCOUNTER — Ambulatory Visit: Payer: Medicare Other

## 2021-08-10 ENCOUNTER — Other Ambulatory Visit: Payer: Self-pay | Admitting: Internal Medicine

## 2021-08-11 ENCOUNTER — Telehealth: Payer: Self-pay

## 2021-08-11 NOTE — Telephone Encounter (Signed)
pt has called asking that her AWV appointment for tomorrow be r/s to 8/18 at 2pm if possible.  Pt asked that you please call her back at [phone].

## 2021-08-12 ENCOUNTER — Telehealth: Payer: Self-pay | Admitting: Internal Medicine

## 2021-08-12 ENCOUNTER — Ambulatory Visit: Payer: Medicare Other

## 2021-08-12 NOTE — Telephone Encounter (Signed)
She want her thyroid medication renewed that's all.

## 2021-08-12 NOTE — Telephone Encounter (Signed)
I called and spoke with patient, she needed to reschedule her AWV. However, patient states that she is confused about her synthroid refill and has questions.  I advised patient that I would send a message to the nurse and someone would call her in regards to this. Please call patient regarding her synthroid medication, thank you.

## 2021-08-12 NOTE — Telephone Encounter (Signed)
Refill sent in on yesterday and left message for patient.

## 2021-08-12 NOTE — Telephone Encounter (Signed)
Called and left message for patient.   If she calls back please find out what question(s) she has regarding thyroid medication.

## 2021-08-13 ENCOUNTER — Ambulatory Visit (INDEPENDENT_AMBULATORY_CARE_PROVIDER_SITE_OTHER): Payer: Medicare Other | Admitting: Nurse Practitioner

## 2021-08-13 ENCOUNTER — Other Ambulatory Visit: Payer: Self-pay

## 2021-08-13 ENCOUNTER — Encounter: Payer: Self-pay | Admitting: Nurse Practitioner

## 2021-08-13 VITALS — BP 122/78

## 2021-08-13 DIAGNOSIS — B373 Candidiasis of vulva and vagina: Secondary | ICD-10-CM

## 2021-08-13 DIAGNOSIS — N3001 Acute cystitis with hematuria: Secondary | ICD-10-CM

## 2021-08-13 DIAGNOSIS — R5383 Other fatigue: Secondary | ICD-10-CM

## 2021-08-13 DIAGNOSIS — N898 Other specified noninflammatory disorders of vagina: Secondary | ICD-10-CM | POA: Diagnosis not present

## 2021-08-13 DIAGNOSIS — Z4689 Encounter for fitting and adjustment of other specified devices: Secondary | ICD-10-CM | POA: Diagnosis not present

## 2021-08-13 DIAGNOSIS — B3731 Acute candidiasis of vulva and vagina: Secondary | ICD-10-CM

## 2021-08-13 DIAGNOSIS — R300 Dysuria: Secondary | ICD-10-CM | POA: Diagnosis not present

## 2021-08-13 LAB — WET PREP FOR TRICH, YEAST, CLUE

## 2021-08-13 MED ORDER — NITROFURANTOIN MONOHYD MACRO 100 MG PO CAPS: 100.0000 mg | ORAL_CAPSULE | Freq: Two times a day (BID) | ORAL | 0 refills | Status: DC

## 2021-08-13 MED ORDER — FLUCONAZOLE 150 MG PO TABS: 150.0000 mg | ORAL_TABLET | Freq: Once | ORAL | 0 refills | Status: AC

## 2021-08-13 NOTE — Progress Notes (Signed)
   Acute Office Visit  Subjective:    Patient ID: Lisa Vega, female    DOB: 1932-02-23, 88 y.o.   MRN: [id number]   HPI 87 y.o. presents today for fatigue, nausea, vaginal irritation, and discharge. He symptoms started a few days ago and she is worried she has a UTI. She would like her pessary removed and left out for now.    Review of Systems  Constitutional:  Positive for fatigue. Negative for chills and fever.  Gastrointestinal:  Positive for nausea. Negative for vomiting.  Genitourinary:  Positive for dysuria, vaginal discharge and vaginal pain (irritation). Negative for frequency, hematuria and urgency.      Objective:    Physical Exam Constitutional:      Appearance: Normal appearance.  Abdominal:     Tenderness: There is no right CVA tenderness or left CVA tenderness.  Genitourinary:    General: Normal vulva.     Vagina: Vaginal discharge and erythema present. No bleeding.    BP 122/78  Wt Readings from Last 3 Encounters:  06/25/21 137 lb 6.4 oz (62.3 kg)  06/02/21 136 lb 9.6 oz (62 kg)  05/01/21 138 lb (62.6 kg)   UA leukocytosis 3+, blood 2+, SG 1.015, negative nitrates, cloudy yellow. Microscopic: wbc >60, rbc 20-40, many bacteria  Wet prep negative     Assessment & Plan:   Problem List Items Addressed This Visit       Other   Fatigue   Relevant Orders   Urinalysis,Complete w/RFL Culture   Other Visit Diagnoses     Acute cystitis with hematuria    -  Primary   Relevant Medications   nitrofurantoin, macrocrystal-monohydrate, (MACROBID) 100 MG capsule   Vaginal irritation       Relevant Orders   WET PREP FOR TRICH, YEAST, CLUE   Pessary maintenance       Vulvovaginal candidiasis       Relevant Medications   nitrofurantoin, macrocrystal-monohydrate, (MACROBID) 100 MG capsule   fluconazole (DIFLUCAN) 150 MG tablet       Plan: Urinalysis suggestive of UTI - Nitrofurantoin 100 mg twice daily x 7 days. She requested Diflucan tablet due to  history of vaginal yeast with antibiotic use. Pessary removed, clean, and given to patient. She will call when she wants it replaced. Culture pending, will triage based on results. She is agreeable to plan.     Tice, 4:42 PM 08/13/2021

## 2021-08-14 ENCOUNTER — Ambulatory Visit: Payer: Medicare Other | Admitting: Neurology

## 2021-08-14 ENCOUNTER — Other Ambulatory Visit: Payer: Self-pay | Admitting: Internal Medicine

## 2021-08-14 ENCOUNTER — Ambulatory Visit: Payer: Medicare Other

## 2021-08-15 LAB — URINALYSIS, COMPLETE W/RFL CULTURE
Bilirubin Urine: NEGATIVE
Glucose, UA: NEGATIVE
Hyaline Cast: NONE SEEN /LPF
Ketones, ur: NEGATIVE
Nitrites, Initial: NEGATIVE
Protein, ur: NEGATIVE
Specific Gravity, Urine: 1.015 (ref 1.001–1.035)
WBC, UA: >=60 /HPF — AB (ref 0–5)
pH: 6 (ref 5.0–8.0)

## 2021-08-15 LAB — URINE CULTURE
MICRO NUMBER:: 12255157
SPECIMEN QUALITY:: ADEQUATE

## 2021-08-15 LAB — CULTURE INDICATED

## 2021-08-18 DIAGNOSIS — M069 Rheumatoid arthritis, unspecified: Secondary | ICD-10-CM | POA: Diagnosis not present

## 2021-08-18 DIAGNOSIS — M21611 Bunion of right foot: Secondary | ICD-10-CM | POA: Diagnosis not present

## 2021-08-18 DIAGNOSIS — M7741 Metatarsalgia, right foot: Secondary | ICD-10-CM | POA: Diagnosis not present

## 2021-08-18 DIAGNOSIS — M79671 Pain in right foot: Secondary | ICD-10-CM | POA: Diagnosis not present

## 2021-08-20 ENCOUNTER — Other Ambulatory Visit: Payer: Self-pay | Admitting: Internal Medicine

## 2021-08-20 DIAGNOSIS — M069 Rheumatoid arthritis, unspecified: Secondary | ICD-10-CM

## 2021-08-26 ENCOUNTER — Telehealth: Payer: Self-pay

## 2021-08-26 ENCOUNTER — Other Ambulatory Visit: Payer: Self-pay | Admitting: *Deleted

## 2021-08-26 DIAGNOSIS — M069 Rheumatoid arthritis, unspecified: Secondary | ICD-10-CM

## 2021-08-26 MED ORDER — "BD TB SYRINGE 27G X 1/2"" 1 ML MISC": 1 refills | Status: AC

## 2021-08-26 NOTE — Telephone Encounter (Signed)
Patient called requesting prescription refill of syringes to be sent to [facility] [address] in [place]. Patient states she has one more syringe left to take with her Methotrexate tonight.

## 2021-09-02 ENCOUNTER — Telehealth: Payer: Self-pay

## 2021-09-02 ENCOUNTER — Ambulatory Visit: Payer: Medicare Other

## 2021-09-02 ENCOUNTER — Other Ambulatory Visit: Payer: Self-pay | Admitting: Pulmonary Disease

## 2021-09-02 NOTE — Telephone Encounter (Signed)
Pt has stated she needed to r/s her AWV as she is needing to go to [facility] to renew her license before her birthday on 09/25/2019.

## 2021-09-03 ENCOUNTER — Ambulatory Visit: Payer: Medicare Other

## 2021-09-09 ENCOUNTER — Encounter: Payer: Self-pay | Admitting: Neurology

## 2021-09-09 ENCOUNTER — Ambulatory Visit (INDEPENDENT_AMBULATORY_CARE_PROVIDER_SITE_OTHER): Payer: Medicare Other | Admitting: Neurology

## 2021-09-09 VITALS — BP 155/78 | HR 64 | Ht 63.5 in | Wt 135.0 lb

## 2021-09-09 DIAGNOSIS — G4489 Other headache syndrome: Secondary | ICD-10-CM | POA: Diagnosis not present

## 2021-09-09 DIAGNOSIS — I6523 Occlusion and stenosis of bilateral carotid arteries: Secondary | ICD-10-CM | POA: Diagnosis not present

## 2021-09-09 DIAGNOSIS — R251 Tremor, unspecified: Secondary | ICD-10-CM | POA: Diagnosis not present

## 2021-09-09 MED ORDER — TIZANIDINE HCL 2 MG PO TABS: 2.0000 mg | ORAL_TABLET | Freq: Two times a day (BID) | ORAL | 1 refills | Status: DC

## 2021-09-09 MED ORDER — GABAPENTIN 100 MG PO CAPS: 100.0000 mg | ORAL_CAPSULE | Freq: Three times a day (TID) | ORAL | 3 refills | Status: DC

## 2021-09-09 NOTE — Progress Notes (Signed)
Reason for visit: Headache, cerebrovascular disease, positional vertigo, rheumatoid arthritis  Lisa Vega is an 87 y.o. female  History of present illness:  Lisa Vega is an 87 year old right-handed white female with a history of rheumatoid arthritis.  The patient has had chronic issues with positional vertigo that is activated by laying on her right side.  She we will temporarily gain benefit with vestibular rehabilitation.  She was given a sheet delineating the Epley maneuvers, but she has not been doing these on a regular basis at home.  She has a baseline chronic issue with gait instability.  She continues to have her daily headache issues with burning sensations that may migrate about the top and the sides and back of the head with occasional pressure sensation behind the eyes.  The patient may take ibuprofen at times for the headache.  She has been tried on duloxetine, gabapentin, Lyrica, and baclofen without benefit or tolerance.  The daughter who comes with her today recalls that while she was on duloxetine that she did not complain of the headaches as much.  The patient does have some neck pain off and on but she does not clearly correlate the neck stiffness and discomfort with the headache.  She has had occasional falls, she fractured her left hip 4 months ago.  She does not use a cane or a walker when she is outside the house.  Past Medical History:  Diagnosis Date   Anxiety    Arthritis    RA   BPV (benign positional vertigo) 03/10/2018   Cancer ([place])    lung ca   Carotid stenosis, left 09/19/2018   COPD (chronic obstructive pulmonary disease) (HCC)    Depression    Dyspnea    with exertion   GERD (gastroesophageal reflux disease)    Headache syndrome 03/09/2019   Hypercholesteremia    Hypothyroidism    Memory difficulties 10/28/2015   Pneumonia     x3  last time 2017   Pre-diabetes    Rheumatoid arthritis(714.0)    Thyroid disease    Tremor 10/28/2015   Jaw  tremor   Uterine prolapse    Vitamin D deficiency     Past Surgical History:  Procedure Laterality Date   ANGIOPLASTY Left 10/10/2018   Procedure: ANGIOPLASTY using 1cm x 14cm Xenosure Patch;  Surgeon: Marty Heck, MD;  Location: [facility] OR;  Service: Vascular;  Laterality: Left;   APPENDECTOMY     COLONOSCOPY W/ POLYPECTOMY     ENDARTERECTOMY Left 10/10/2018   Procedure: ENDARTERECTOMY CAROTID;  Surgeon: Marty Heck, MD;  Location: [facility] OR;  Service: Vascular;  Laterality: Left;   EYE SURGERY Bilateral    cataract   FOOT SURGERY Bilateral    arthritis   Hands  Bilateral    Arthritis   HEMORRHOID SURGERY     right lobectomy  10/2004   TONSILLECTOMY AND ADENOIDECTOMY      Family History  Problem Relation Age of Onset   Emphysema Mother    Asthma Mother    Lung cancer Father    Pancreatic cancer Father    Bone cancer Father    Heart disease Brother        x1   Diabetes Brother    Hypertension Brother    Lupus Brother    Heart disease Sister        x2   Diabetes Sister    Hypertension Sister    Diabetes Sister    Hypertension Sister  Diabetes Brother    Hypertension Brother    Stroke Maternal Grandfather    Hypertension Son    Tremor Son     Social history:  reports that she quit smoking about 16 years ago. Her smoking use included cigarettes. She has a 20.00 pack-year smoking history. She has never used smokeless tobacco. She reports current alcohol use of about 1.0 standard drink per week. She reports that she does not use drugs.    Allergies  Allergen Reactions   Codeine     REACTION: hallucinations   Demerol    Meperidine Hcl     REACTION: severe GI upset   Meperidine Hcl    Other Other (See Comments)   Sulfa Antibiotics Rash    Medications:  Prior to Admission medications   Medication Sig Start Date End Date Taking? Authorizing Provider  acetaminophen (TYLENOL) 325 MG tablet Take 650 mg by mouth every 6 (six) hours as needed for mild  pain.    Yes [provider]  albuterol (PROAIR HFA) 108 (90 Base) MCG/ACT inhaler USE 1-2 PUFFS EVERY 4-6 HOURS AS NEEDED 10/03/19  Yes Olalere, Adewale A, MD  ALPRAZolam (XANAX) 0.25 MG tablet TAKE 1 TABLET BY MOUTH EVERY DAY AS NEEDED FOR ANXIETY 08/14/21  Yes Janith Lima, MD  aspirin EC 81 MG tablet Take 81 mg by mouth daily.   Yes [provider]  B-D TB SYRINGE 1CC/27GX1/2" 27G X 1/2" 1 ML MISC For use with methotrexate injection weekly 08/26/21  Yes Rice, Resa Miner, MD  budesonide-formoterol (SYMBICORT) 160-4.5 MCG/ACT inhaler USE 2 INHALATIONS ORALLY   TWICE DAILY 09/02/21  Yes Olalere, Adewale A, MD  clidinium-chlordiazePOXIDE (LIBRAX) 5-2.5 MG capsule SMARTSIG:1 Capsule(s) By Mouth Every 12 Hours PRN 04/22/20  Yes [provider]  famotidine (PEPCID) 40 MG tablet TAKE 1 TABLET DAILY 03/14/21  Yes Burns, [name], MD  folic acid (FOLVITE) 1 MG tablet Take 1 tablet (1 mg total) by mouth daily. 06/25/21  Yes Rice, Resa Miner, MD  gabapentin (NEURONTIN) 100 MG capsule Take 1 capsule (100 mg total) by mouth 3 (three) times daily. 02/06/21  Yes Suzzanne Cloud, NP  glucose blood (ONETOUCH ULTRA) test strip Use to check blood sugars twice a day 01/02/20  Yes Burns, [name], MD  ibuprofen (ADVIL,MOTRIN) 200 MG tablet Take 200 mg by mouth every 6 (six) hours as needed for headache or mild pain.   Yes [provider]  meclizine (ANTIVERT) 25 MG tablet Take 0.5-1 tablets (12.5-25 mg total) by mouth 3 (three) times daily as needed for dizziness. 01/03/21  Yes Burns, [name], MD  Methotrexate Sodium (METHOTREXATE, PF,) 50 MG/2ML injection INJECT 0.7 MLS (17.5 MG TOTAL) INTO THE SKIN ONCE A WEEK. 08/20/21  Yes Rice, Resa Miner, MD  mupirocin ointment (BACTROBAN) 2 % Place 1 application into the nose 2 (two) times daily. 01/17/19  Yes Fontaine, Belinda Block, MD  nitrofurantoin, macrocrystal-monohydrate, (MACROBID) 100 MG capsule Take 1 capsule (100 mg total) by mouth 2 (two)  times daily. 08/13/21  Yes Marny Lowenstein A, NP  omeprazole (PRILOSEC) 40 MG capsule Take 40 mg by mouth daily.   Yes [provider]  [facility] DELICA LANCETS 91M MISC USE TO CHECK SUGARS UP TO 4 TIMES A DAY 03/19/18  Yes [provider]  Plecanatide (TRULANCE) 3 MG TABS as needed. 08/29/20  Yes [provider]  sertraline (ZOLOFT) 100 MG tablet TAKE 1 TABLET BY MOUTH EVERY DAY 11/29/20  Yes Burns, [name], MD  SPIRIVA RESPIMAT 2.5 MCG/ACT AERS USE 2 INHALATIONS ORALLY   DAILY 09/02/21  Yes Olalere, Adewale A, MD  SYNTHROID 88 MCG tablet TAKE 1 TABLET DAILY 08/11/21  Yes Burns, [name], MD  terconazole (TERAZOL 7) 0.4 % vaginal cream Place 1 applicator vaginally at bedtime. 05/01/21  Yes Karma Ganja, NP  traMADol (ULTRAM) 50 MG tablet Take 50 mg by mouth 3 (three) times daily as needed for moderate pain.  05/07/18  Yes [provider]  triamcinolone ointment (KENALOG) 0.1 % Apply 1 application topically 2 (two) times daily as needed. 07/29/20  Yes Joseph Pierini, MD  Vitamin D, Cholecalciferol, 10 MCG (400 UNIT) CHEW Chew by mouth daily. Unsure of current dose   Yes [provider]    ROS:  Out of a complete 14 system review of symptoms, the patient complains only of the following symptoms, and all other reviewed systems are negative.  Walking difficulty Vertigo Headache Mild memory changes  Height 5' 3.5" (1.613 m), weight 135 lb (61.2 kg).  Physical Exam  General: The patient is alert and cooperative at the time of the examination.  Skin: No significant peripheral edema is noted.   Neurologic Exam  Mental status: The patient is alert and oriented x 3 at the time of the examination. The patient has apparent normal recent and remote memory, with an apparently normal attention span and concentration ability.   Cranial nerves: Facial symmetry is present. Speech is normal, no aphasia or dysarthria is noted. Extraocular movements are full. Visual  fields are full.  Motor: The patient has good strength in all 4 extremities.  Sensory examination: Soft touch sensation is symmetric on the face, arms, and legs.  Coordination: The patient has good finger-nose-finger and heel-to-shin bilaterally.  Gait and station: The patient has a slightly wide-based gait, she can walk independently.  Tandem gait was not attempted.  Romberg is negative but is unsteady.  Reflexes: Deep tendon reflexes are symmetric.   Assessment/Plan:  1.  Positional vertigo  2.  Chronic gait disorder  3.  Mild memory issues  4.  Chronic daily headache, probable tension headache  The patient will be placed on low-dose tizanidine for the headache, she will call for any dose adjustments.  She will remain on gabapentin, prescription was sent in.  She is to try to read and understand the Epley maneuvers and initiate this for her positional vertigo.  She will follow-up here in 6 months, in the future she can be followed through Dr. Jaynee Eagles.  Jill Alexanders MD 09/09/2021 10:01 AM  [facility] [address] [place], [place] [address]  Phone [phone] Fax [phone]

## 2021-09-12 ENCOUNTER — Ambulatory Visit (INDEPENDENT_AMBULATORY_CARE_PROVIDER_SITE_OTHER): Payer: Medicare Other

## 2021-09-12 VITALS — Ht 64.0 in | Wt 135.0 lb

## 2021-09-12 DIAGNOSIS — Z0000 Encounter for general adult medical examination without abnormal findings: Secondary | ICD-10-CM | POA: Diagnosis not present

## 2021-09-12 NOTE — Progress Notes (Signed)
I connected with Lisa Vega today by telephone and verified that I am speaking with the correct person using two identifiers. Location patient: home Location provider: work Persons participating in the virtual visit: patient, provider.   I discussed the limitations, risks, security and privacy concerns of performing an evaluation and management service by telephone and the availability of in person appointments. I also discussed with the patient that there may be a patient responsible charge related to this service. The patient expressed understanding and verbally consented to this telephonic visit.    Interactive audio and video telecommunications were attempted between this provider and patient, however failed, due to patient having technical difficulties OR patient did not have access to video capability.  We continued and completed visit with audio only.  Some vital signs may be absent or patient reported.   Time Spent with patient on telephone encounter: 40 minutes  Subjective:   Lisa Vega is a 87 y.o. female who presents for Medicare Annual (Subsequent) preventive examination.  Review of Systems     Cardiac Risk Factors include: advanced age (>28men, >24 women);diabetes mellitus;family history of premature cardiovascular disease;dyslipidemia     Objective:    Today's Vitals   09/12/21 1129  Weight: 135 lb (61.2 kg)  Height: 5\' 4"  (1.626 m)   Body mass index is 23.17 kg/m.  Advanced Directives 09/12/2021 04/16/2021 02/14/2021 05/06/2019 10/10/2018 10/06/2018 10/04/2018  Does Patient Have a Medical Advance Directive? No Yes Yes No No No Yes  Type of Advance Directive - [place];Living will [place];Living will - Healthcare Power of [place]  Does patient want to make changes to medical advance directive? - - - - - - -  Copy of [place] in Chart? - - - - No - copy requested - No -  copy requested  Would patient like information on creating a medical advance directive? No - Patient declined - - - No - Patient declined No - Patient declined -  Pre-existing out of facility DNR order (yellow form or pink MOST form) - - - - - - -    Current Medications (verified) Outpatient Encounter Medications as of 09/12/2021  Medication Sig   acetaminophen (TYLENOL) 325 MG tablet Take 650 mg by mouth every 6 (six) hours as needed for mild pain.    albuterol (PROAIR HFA) 108 (90 Base) MCG/ACT inhaler USE 1-2 PUFFS EVERY 4-6 HOURS AS NEEDED   ALPRAZolam (XANAX) 0.25 MG tablet TAKE 1 TABLET BY MOUTH EVERY DAY AS NEEDED FOR ANXIETY   aspirin EC 81 MG tablet Take 81 mg by mouth daily.   B-D TB SYRINGE 1CC/27GX1/2" 27G X 1/2" 1 ML MISC For use with methotrexate injection weekly   budesonide-formoterol (SYMBICORT) 160-4.5 MCG/ACT inhaler USE 2 INHALATIONS ORALLY   TWICE DAILY   clidinium-chlordiazePOXIDE (LIBRAX) 5-2.5 MG capsule SMARTSIG:1 Capsule(s) By Mouth Every 12 Hours PRN   famotidine (PEPCID) 40 MG tablet TAKE 1 TABLET DAILY   folic acid (FOLVITE) 1 MG tablet Take 1 tablet (1 mg total) by mouth daily.   gabapentin (NEURONTIN) 100 MG capsule Take 1 capsule (100 mg total) by mouth 3 (three) times daily.   glucose blood (ONETOUCH ULTRA) test strip Use to check blood sugars twice a day   ibuprofen (ADVIL,MOTRIN) 200 MG tablet Take 200 mg by mouth every 6 (six) hours as needed for headache or mild pain.   meclizine (ANTIVERT) 25 MG tablet Take 0.5-1 tablets (12.5-25  mg total) by mouth 3 (three) times daily as needed for dizziness.   Methotrexate Sodium (METHOTREXATE, PF,) 50 MG/2ML injection INJECT 0.7 MLS (17.5 MG TOTAL) INTO THE SKIN ONCE A WEEK.   mupirocin ointment (BACTROBAN) 2 % Place 1 application into the nose 2 (two) times daily.   nitrofurantoin, macrocrystal-monohydrate, (MACROBID) 100 MG capsule Take 1 capsule (100 mg total) by mouth 2 (two) times daily.   omeprazole (PRILOSEC) 40  MG capsule Take 40 mg by mouth daily.   ONETOUCH DELICA LANCETS 38V MISC USE TO CHECK SUGARS UP TO 4 TIMES A DAY   Plecanatide (TRULANCE) 3 MG TABS as needed.   sertraline (ZOLOFT) 100 MG tablet TAKE 1 TABLET BY MOUTH EVERY DAY   SPIRIVA RESPIMAT 2.5 MCG/ACT AERS USE 2 INHALATIONS ORALLY   DAILY   SYNTHROID 88 MCG tablet TAKE 1 TABLET DAILY   terconazole (TERAZOL 7) 0.4 % vaginal cream Place 1 applicator vaginally at bedtime.   tiZANidine (ZANAFLEX) 2 MG tablet Take 1 tablet (2 mg total) by mouth 2 (two) times daily.   traMADol (ULTRAM) 50 MG tablet Take 50 mg by mouth 3 (three) times daily as needed for moderate pain.    triamcinolone ointment (KENALOG) 0.1 % Apply 1 application topically 2 (two) times daily as needed.   Vitamin D, Cholecalciferol, 10 MCG (400 UNIT) CHEW Chew by mouth daily. Unsure of current dose   No facility-administered encounter medications on file as of 09/12/2021.    Allergies (verified) Codeine, Demerol, Meperidine hcl, Meperidine hcl, Other, and Sulfa antibiotics   History: Past Medical History:  Diagnosis Date   Anxiety    Arthritis    RA   BPV (benign positional vertigo) 03/10/2018   Cancer (HCC)    lung ca   Carotid stenosis, left 09/19/2018   COPD (chronic obstructive pulmonary disease) (HCC)    Depression    Dyspnea    with exertion   GERD (gastroesophageal reflux disease)    Headache syndrome 03/09/2019   Hypercholesteremia    Hypothyroidism    Memory difficulties 10/28/2015   Pneumonia     x3  last time 2017   Pre-diabetes    Rheumatoid arthritis(714.0)    Thyroid disease    Tremor 10/28/2015   Jaw tremor   Uterine prolapse    Vitamin D deficiency    Past Surgical History:  Procedure Laterality Date   ANGIOPLASTY Left 10/10/2018   Procedure: ANGIOPLASTY using 1cm x 14cm Xenosure Patch;  Surgeon: Marty Heck, MD;  Location: [facility] OR;  Service: Vascular;  Laterality: Left;   APPENDECTOMY     COLONOSCOPY W/ POLYPECTOMY      ENDARTERECTOMY Left 10/10/2018   Procedure: ENDARTERECTOMY CAROTID;  Surgeon: Marty Heck, MD;  Location: [facility] OR;  Service: Vascular;  Laterality: Left;   EYE SURGERY Bilateral    cataract   FOOT SURGERY Bilateral    arthritis   Hands  Bilateral    Arthritis   HEMORRHOID SURGERY     right lobectomy  10/2004   TONSILLECTOMY AND ADENOIDECTOMY     Family History  Problem Relation Age of Onset   Emphysema Mother    Asthma Mother    Lung cancer Father    Pancreatic cancer Father    Bone cancer Father    Heart disease Brother        x1   Diabetes Brother    Hypertension Brother    Lupus Brother    Heart disease Sister  x2   Diabetes Sister    Hypertension Sister    Diabetes Sister    Hypertension Sister    Diabetes Brother    Hypertension Brother    Stroke Maternal Grandfather    Hypertension Son    Tremor Son    Social History   Socioeconomic History   Marital status: Widowed    Spouse name: Not on file   Number of children: 3   Years of education: LPN   Highest education level: Not on file  Occupational History   Occupation: retired    Fish farm manager: RETIRED    Comment: LPN at [employer] Use   Smoking status: Former    Packs/day: 1.00    Years: 20.00    Pack years: 20.00    Types: Cigarettes    Quit date: 11/10/2004    Years since quitting: 16.8   Smokeless tobacco: Never  Vaping Use   Vaping Use: Never used  Substance and Sexual Activity   Alcohol use: Yes    Alcohol/week: 1.0 standard drink    Types: 1 Glasses of wine per week    Comment: socially   Drug use: No   Sexual activity: Not Currently  Other Topics Concern   Not on file  Social History Narrative   09/09/21 lives with dgtr Lovey Newcomer; widowed   Patient drinks about 3 cups of coffee daily.   Patient is right handed.    Retired Corporate treasurer at [employer] Strain: Low Risk    Difficulty of Paying Living  Expenses: Not hard at all  Food Insecurity: No Food Insecurity   Worried About Charity fundraiser in the Last Year: Never true   Arboriculturist in the Last Year: Never true  Transportation Needs: No Transportation Needs   Lack of Transportation (Medical): No   Lack of Transportation (Non-Medical): No  Physical Activity: Inactive   Days of Exercise per Week: 0 days   Minutes of Exercise per Session: 0 min  Stress: No Stress Concern Present   Feeling of Stress : Not at all  Social Connections: Socially Isolated   Frequency of Communication with Friends and Family: More than three times a week   Frequency of Social Gatherings with Friends and Family: Once a week   Attends Religious Services: Never   Marine scientist or Organizations: No   Attends Archivist Meetings: Never   Marital Status: Widowed    Tobacco Counseling Counseling given: Not Answered   Clinical Intake:  Pre-visit preparation completed: Yes  Pain : No/denies pain     BMI - recorded: 23.17 Nutritional Status: BMI of 19-24  Normal Nutritional Risks: None Diabetes: Yes CBG done?: No Did pt. bring in CBG monitor from home?: No  How often do you need to have someone help you when you read instructions, pamphlets, or other written materials from your doctor or pharmacy?: 1 - Never What is the last grade level you completed in school?: HSG; Nursing classes; worked for [employer] for > 18 years  Diabetic? yes  Interpreter Needed?: No  Information entered by :: Lisette Abu, LPN   Activities of Daily Living In your present state of health, do you have any difficulty performing the following activities: 09/12/2021 11/29/2020  Hearing? Y N  Vision? N N  Difficulty concentrating or making decisions? Y N  Walking or climbing stairs? N N  Dressing or bathing?  N N  Doing errands, shopping? Y N  Preparing Food and eating ? N -  Using the Toilet? N -  In the past six months, have you  accidently leaked urine? Y -  Do you have problems with loss of bowel control? N -  Managing your Medications? N -  Managing your Finances? N -  Housekeeping or managing your Housekeeping? N -  Some recent data might be hidden    Patient Care Team: Binnie Rail, MD as PCP - General (Internal Medicine) Wellington Hampshire, MD as PCP - Cardiology (Cardiology) Clarene Essex, MD (Gastroenterology) Izora Gala, MD as Consulting Physician (Otolaryngology) Lenon Oms, MD as Referring Physician (Obstetrics and Gynecology) Juanito Doom, MD as Consulting Physician (Pulmonary Disease) Curt Bears, MD as Consulting Physician (Oncology) Gavin Pound, MD as Consulting Physician (Rheumatology) Marilynne Halsted, MD as Referring Physician (Ophthalmology) Marty Heck, MD as Consulting Physician (Vascular Surgery) Kathrynn Ducking, MD as Consulting Physician (Neurology) Charlton Haws, [facility] as Pharmacist (Pharmacist)  Indicate any recent Medical Services you may have received from other than [facility] providers in the past year (date may be approximate).     Assessment:   This is a routine wellness examination for Kamaria.  Hearing/Vision screen Hearing Screening - Comments:: Patient has decreased hearing in left ear; No hearing aids. Vision Screening - Comments:: Patient wears eye glasses.  Eye exam done every 6 months by Marilynne Halsted, MD.  Dietary issues and exercise activities discussed: Current Exercise Habits: The patient does not participate in regular exercise at present, Exercise limited by: orthopedic condition(s);neurologic condition(s);psychological condition(s);respiratory conditions(s)   Goals Addressed   None   Depression Screen PHQ 2/9 Scores 09/12/2021 06/02/2021 11/29/2020 05/13/2018 05/12/2017 05/06/2016 12/13/2014  PHQ - 2 Score 0 1 1 1 2  0 1  PHQ- 9 Score - 1 2 5 6  - 4    Fall Risk Fall Risk  09/12/2021 06/02/2021 06/02/2021 02/02/2020 05/13/2018   Falls in the past year? 1 0 - 0 No  Number falls in past yr: 0 0 0 0 -  Injury with Fall? 1 0 0 - -  Risk for fall due to : - No Fall Risks No Fall Risks - -  Follow up - Falls evaluation completed Falls evaluation completed - -    FALL RISK PREVENTION PERTAINING TO THE HOME:  Any stairs in or around the home? Yes  If so, are there any without handrails? No  Home free of loose throw rugs in walkways, pet beds, electrical cords, etc? Yes  Adequate lighting in your home to reduce risk of falls? Yes   ASSISTIVE DEVICES UTILIZED TO PREVENT FALLS:  Life alert? No  Use of a cane, walker or w/c? No  Grab bars in the bathroom? Yes  Shower chair or bench in shower? No  Elevated toilet seat or a handicapped toilet? Yes   TIMED UP AND GO:  Was the test performed? No .  Length of time to ambulate 10 feet: n/a sec.   Gait steady and fast without use of assistive device (per patient)  Cognitive Function: Normal cognitive status assessed by direct observation by this Nurse Health Advisor. No abnormalities found.   MMSE - Mini Mental State Exam 05/13/2018 10/28/2015  Orientation to time 5 5  Orientation to Place 5 5  Registration 3 3  Attention/ Calculation 3 0  Recall 1 3  Language- name 2 objects 2 2  Language- repeat 1 1  Language- follow 3 step command  3 3  Language- read & follow direction 1 1  Write a sentence 1 1  Copy design 1 1  Total score 26 25        Immunizations Immunization History  Administered Date(s) Administered   Fluad Quad(high Dose 65+) 12/08/2019, 10/31/2020   Influenza Split 10/18/2009, 09/28/2011, 10/28/2011, 09/28/2015, 10/09/2015   Influenza Whole 09/27/2012   Influenza, High Dose Seasonal PF 10/28/2016, 01/12/2017, 09/22/2017, 10/03/2018   Influenza,inj,Quad PF,6+ Mos 09/20/2013, 09/12/2014   Pneumococcal Conjugate-13 10/24/2015, 10/31/2020   Pneumococcal Polysaccharide-23 12/29/2007, 07/10/2008    TDAP status: Due, Education has been provided  regarding the importance of this vaccine. Advised may receive this vaccine at local pharmacy or Health Dept. Aware to provide a copy of the vaccination record if obtained from local pharmacy or Health Dept. Verbalized acceptance and understanding.  Flu Vaccine status: Up to date  Pneumococcal vaccine status: Up to date  Covid-19 vaccine status: Declined, Education has been provided regarding the importance of this vaccine but patient still declined. Advised may receive this vaccine at local pharmacy or Health Dept.or vaccine clinic. Aware to provide a copy of the vaccination record if obtained from local pharmacy or Health Dept. Verbalized acceptance and understanding.  Qualifies for Shingles Vaccine? Yes   Zostavax completed No   Shingrix Completed?: No.    Education has been provided regarding the importance of this vaccine. Patient has been advised to call insurance company to determine out of pocket expense if they have not yet received this vaccine. Advised may also receive vaccine at local pharmacy or Health Dept. Verbalized acceptance and understanding.  Screening Tests Health Maintenance  Topic Date Due   COVID-19 Vaccine (1) Never done   TETANUS/TDAP  Never done   Zoster Vaccines- Shingrix (1 of 2) Never done   FOOT EXAM  05/14/2019   OPHTHALMOLOGY EXAM  06/27/2020   INFLUENZA VACCINE  07/28/2021   HEMOGLOBIN A1C  12/02/2021   URINE MICROALBUMIN  06/02/2022   DEXA SCAN  Completed   PNA vac Low Risk Adult  Completed   HPV VACCINES  Aged Out    Health Maintenance  Health Maintenance Due  Topic Date Due   COVID-19 Vaccine (1) Never done   TETANUS/TDAP  Never done   Zoster Vaccines- Shingrix (1 of 2) Never done   FOOT EXAM  05/14/2019   OPHTHALMOLOGY EXAM  06/27/2020   INFLUENZA VACCINE  07/28/2021    Colorectal cancer screening: No longer required.   Mammogram status: No longer required due to age.  Bone Density status: Completed 11/17/2016. Results reflect: Bone  density results: NORMAL. Repeat every 5 years. (No longer recommended)  Lung Cancer Screening: (Low Dose CT Chest recommended if Age 37-80 years, 30 pack-year currently smoking OR have quit w/in 15years.) does not qualify.   Lung Cancer Screening Referral: no  Additional Screening:  Hepatitis C Screening: does not qualify; Completed no  Vision Screening: Recommended annual ophthalmology exams for early detection of glaucoma and other disorders of the eye. Is the patient up to date with their annual eye exam?  Yes  Who is the provider or what is the name of the office in which the patient attends annual eye exams? Marilynne Halsted, MD. If pt is not established with a provider, would they like to be referred to a provider to establish care? No .   Dental Screening: Recommended annual dental exams for proper oral hygiene  Community Resource Referral / Chronic Care Management: CRR required this visit?  No  CCM required this visit?  No      Plan:     I have personally reviewed and noted the following in the patient's chart:   Medical and social history Use of alcohol, tobacco or illicit drugs  Current medications and supplements including opioid prescriptions.  Functional ability and status Nutritional status Physical activity Advanced directives List of other physicians Hospitalizations, surgeries, and ER visits in previous 12 months Vitals Screenings to include cognitive, depression, and falls Referrals and appointments  In addition, I have reviewed and discussed with patient certain preventive protocols, quality metrics, and best practice recommendations. A written personalized care plan for preventive services as well as general preventive health recommendations were provided to patient.     Sheral Flow, LPN   [id number]   Nurse Notes:  Patient is cogitatively intact. There were no vitals filed for this visit. Patient stated that she has no issues with gait  or balance; does not use any assistive devices. Medications reviewed with patient; no opioid use noted. Hearing Screening - Comments:: Patient has decreased hearing in left ear; No hearing aids. Vision Screening - Comments:: Patient wears eye glasses.  Eye exam done every 6 months by Marilynne Halsted, MD.

## 2021-09-12 NOTE — Patient Instructions (Addendum)
Lisa Vega , Thank you for taking time to come for your Medicare Wellness Visit. I appreciate your ongoing commitment to your health goals. Please review the following plan we discussed and let me know if I can assist you in the future.   Screening recommendations/referrals: Colonoscopy: Not a candidate for screening due to age 87: Not a candidate for screening due to age Bone Density: Not a candidate for screening due to age Recommended yearly ophthalmology/optometry visit for glaucoma screening and checkup Recommended yearly dental visit for hygiene and checkup  Vaccinations: Influenza vaccine: 10/31/2020; due Fall 2022 Pneumococcal vaccine: 07/10/2008, 10/31/2020 Tdap vaccine: declined Shingles vaccine: declined   Covid-19: declined  Advanced directives: Advance directive discussed with you today. Even though you declined this today please call our office should you change your mind and we can give you the proper paperwork for you to fill out.  Conditions/risks identified: Yes; My goal is to continue to stay well.  Next appointment: Please schedule your next Medicare Wellness Visit with your Nurse Health Advisor in 1 year by calling [phone].   Preventive Care 47 Years and Older, Female Preventive care refers to lifestyle choices and visits with your health care provider that can promote health and wellness. What does preventive care include? A yearly physical exam. This is also called an annual well check. Dental exams once or twice a year. Routine eye exams. Ask your health care provider how often you should have your eyes checked. Personal lifestyle choices, including: Daily care of your teeth and gums. Regular physical activity. Eating a healthy diet. Avoiding tobacco and drug use. Limiting alcohol use. Practicing safe sex. Taking low-dose aspirin every day. Taking vitamin and mineral supplements as recommended by your health care provider. What happens  during an annual well check? The services and screenings done by your health care provider during your annual well check will depend on your age, overall health, lifestyle risk factors, and family history of disease. Counseling  Your health care provider may ask you questions about your: Alcohol use. Tobacco use. Drug use. Emotional well-being. Home and relationship well-being. Sexual activity. Eating habits. History of falls. Memory and ability to understand (cognition). Work and work Statistician. Reproductive health. Screening  You may have the following tests or measurements: Height, weight, and BMI. Blood pressure. Lipid and cholesterol levels. These may be checked every 5 years, or more frequently if you are over 84 years old. Skin check. Lung cancer screening. You may have this screening every year starting at age 17 if you have a 30-pack-year history of smoking and currently smoke or have quit within the past 15 years. Fecal occult blood test (FOBT) of the stool. You may have this test every year starting at age 38. Flexible sigmoidoscopy or colonoscopy. You may have a sigmoidoscopy every 5 years or a colonoscopy every 10 years starting at age 53. Hepatitis C blood test. Hepatitis B blood test. Sexually transmitted disease (STD) testing. Diabetes screening. This is done by checking your blood sugar (glucose) after you have not eaten for a while (fasting). You may have this done every 1-3 years. Bone density scan. This is done to screen for osteoporosis. You may have this done starting at age 85. Mammogram. This may be done every 1-2 years. Talk to your health care provider about how often you should have regular mammograms. Talk with your health care provider about your test results, treatment options, and if necessary, the need for more tests. Vaccines  Your health care provider  may recommend certain vaccines, such as: Influenza vaccine. This is recommended every  year. Tetanus, diphtheria, and acellular pertussis (Tdap, Td) vaccine. You may need a Td booster every 10 years. Zoster vaccine. You may need this after age 22. Pneumococcal 13-valent conjugate (PCV13) vaccine. One dose is recommended after age 10. Pneumococcal polysaccharide (PPSV23) vaccine. One dose is recommended after age 37. Talk to your health care provider about which screenings and vaccines you need and how often you need them. This information is not intended to replace advice given to you by your health care provider. Make sure you discuss any questions you have with your health care provider. Document Released: 01/10/2016 Document Revised: 09/02/2016 Document Reviewed: 10/15/2015 Elsevier Interactive Patient Education  2017 [employer] Prevention in the Home Falls can cause injuries. They can happen to people of all ages. There are many things you can do to make your home safe and to help prevent falls. What can I do on the outside of my home? Regularly fix the edges of walkways and driveways and fix any cracks. Remove anything that might make you trip as you walk through a door, such as a raised step or threshold. Trim any bushes or trees on the path to your home. Use bright outdoor lighting. Clear any walking paths of anything that might make someone trip, such as rocks or tools. Regularly check to see if handrails are loose or broken. Make sure that both sides of any steps have handrails. Any raised decks and porches should have guardrails on the edges. Have any leaves, snow, or ice cleared regularly. Use sand or salt on walking paths during winter. Clean up any spills in your garage right away. This includes oil or grease spills. What can I do in the bathroom? Use night lights. Install grab bars by the toilet and in the tub and shower. Do not use towel bars as grab bars. Use non-skid mats or decals in the tub or shower. If you need to sit down in the shower, use a  plastic, non-slip stool. Keep the floor dry. Clean up any water that spills on the floor as soon as it happens. Remove soap buildup in the tub or shower regularly. Attach bath mats securely with double-sided non-slip rug tape. Do not have throw rugs and other things on the floor that can make you trip. What can I do in the bedroom? Use night lights. Make sure that you have a light by your bed that is easy to reach. Do not use any sheets or blankets that are too big for your bed. They should not hang down onto the floor. Have a firm chair that has side arms. You can use this for support while you get dressed. Do not have throw rugs and other things on the floor that can make you trip. What can I do in the kitchen? Clean up any spills right away. Avoid walking on wet floors. Keep items that you use a lot in easy-to-reach places. If you need to reach something above you, use a strong step stool that has a grab bar. Keep electrical cords out of the way. Do not use floor polish or wax that makes floors slippery. If you must use wax, use non-skid floor wax. Do not have throw rugs and other things on the floor that can make you trip. What can I do with my stairs? Do not leave any items on the stairs. Make sure that there are handrails on both sides  of the stairs and use them. Fix handrails that are broken or loose. Make sure that handrails are as long as the stairways. Check any carpeting to make sure that it is firmly attached to the stairs. Fix any carpet that is loose or worn. Avoid having throw rugs at the top or bottom of the stairs. If you do have throw rugs, attach them to the floor with carpet tape. Make sure that you have a light switch at the top of the stairs and the bottom of the stairs. If you do not have them, ask someone to add them for you. What else can I do to help prevent falls? Wear shoes that: Do not have high heels. Have rubber bottoms. Are comfortable and fit you  well. Are closed at the toe. Do not wear sandals. If you use a stepladder: Make sure that it is fully opened. Do not climb a closed stepladder. Make sure that both sides of the stepladder are locked into place. Ask someone to hold it for you, if possible. Clearly mark and make sure that you can see: Any grab bars or handrails. First and last steps. Where the edge of each step is. Use tools that help you move around (mobility aids) if they are needed. These include: Canes. Walkers. Scooters. Crutches. Turn on the lights when you go into a dark area. Replace any light bulbs as soon as they burn out. Set up your furniture so you have a clear path. Avoid moving your furniture around. If any of your floors are uneven, fix them. If there are any pets around you, be aware of where they are. Review your medicines with your doctor. Some medicines can make you feel dizzy. This can increase your chance of falling. Ask your doctor what other things that you can do to help prevent falls. This information is not intended to replace advice given to you by your health care provider. Make sure you discuss any questions you have with your health care provider. Document Released: 10/10/2009 Document Revised: 05/21/2016 Document Reviewed: 01/18/2015 Elsevier Interactive Patient Education  2017 [employer].

## 2021-09-16 ENCOUNTER — Encounter: Payer: Self-pay | Admitting: Nurse Practitioner

## 2021-09-16 ENCOUNTER — Ambulatory Visit: Payer: Medicare Other | Admitting: Nurse Practitioner

## 2021-09-16 ENCOUNTER — Ambulatory Visit (INDEPENDENT_AMBULATORY_CARE_PROVIDER_SITE_OTHER): Payer: Medicare Other | Admitting: Nurse Practitioner

## 2021-09-16 ENCOUNTER — Other Ambulatory Visit: Payer: Self-pay

## 2021-09-16 VITALS — BP 122/76

## 2021-09-16 DIAGNOSIS — N8189 Other female genital prolapse: Secondary | ICD-10-CM

## 2021-09-16 DIAGNOSIS — N898 Other specified noninflammatory disorders of vagina: Secondary | ICD-10-CM | POA: Diagnosis not present

## 2021-09-16 DIAGNOSIS — Z4689 Encounter for fitting and adjustment of other specified devices: Secondary | ICD-10-CM | POA: Diagnosis not present

## 2021-09-16 LAB — WET PREP FOR TRICH, YEAST, CLUE

## 2021-09-16 NOTE — Progress Notes (Signed)
   Acute Office Visit  Subjective:    Patient ID: Lisa Vega, female    DOB: 1932-11-01, 87 y.o.   MRN: [id number]   HPI 87 y.o. presents today to have pessary re-inserted. We removed 1 month ago per patient preference. She is going out of town for an extended period and would like it replaced. She also complains of vaginal discharge. This is not necessarily new for her and something she has with pessary use, but she does have history of frequent yeast infections.   Review of Systems  Constitutional: Negative.   Genitourinary:  Positive for vaginal discharge. Negative for dysuria, frequency, hematuria, urgency, vaginal bleeding and vaginal pain.      Objective:    Physical Exam Constitutional:      Appearance: Normal appearance.  Genitourinary:    General: Normal vulva.     Vagina: Normal.    BP 122/76  Wt Readings from Last 3 Encounters:  09/12/21 135 lb (61.2 kg)  09/09/21 135 lb (61.2 kg)  06/25/21 137 lb 6.4 oz (62.3 kg)   Wet prep negative     Assessment & Plan:   Problem List Items Addressed This Visit   None Visit Diagnoses     Vaginal discharge    -  Primary   Relevant Orders   WET PREP FOR [place], YEAST, CLUE   Pessary maintenance       Pelvic relaxation          Plan: Pessary inserted with ease. Wet prep negative and exam normal. Return in 3 months for pessary maintenance or sooner if needed.      Tamela Gammon DNP, 12:03 PM 09/16/2021

## 2021-09-19 ENCOUNTER — Encounter: Payer: Self-pay | Admitting: Podiatrist

## 2021-09-19 ENCOUNTER — Other Ambulatory Visit: Payer: Self-pay

## 2021-09-19 ENCOUNTER — Ambulatory Visit (INDEPENDENT_AMBULATORY_CARE_PROVIDER_SITE_OTHER): Payer: Medicare Other | Admitting: Podiatrist

## 2021-09-19 DIAGNOSIS — M2041 Other hammer toe(s) (acquired), right foot: Secondary | ICD-10-CM | POA: Diagnosis not present

## 2021-09-19 DIAGNOSIS — I6523 Occlusion and stenosis of bilateral carotid arteries: Secondary | ICD-10-CM

## 2021-09-19 DIAGNOSIS — E0840 Diabetes mellitus due to underlying condition with diabetic neuropathy, unspecified: Secondary | ICD-10-CM | POA: Diagnosis not present

## 2021-09-19 DIAGNOSIS — M2042 Other hammer toe(s) (acquired), left foot: Secondary | ICD-10-CM

## 2021-09-19 DIAGNOSIS — L84 Corns and callosities: Secondary | ICD-10-CM | POA: Diagnosis not present

## 2021-09-19 DIAGNOSIS — B351 Tinea unguium: Secondary | ICD-10-CM

## 2021-09-19 NOTE — Progress Notes (Signed)
35.1  Chief Complaint  Patient presents with   Nail Problem    Nail trim  Foot exam      HPI: Patient is 87 y.o. female who presents today for a foot exam and nail trim bilateral.  She has rheumatoid arthritis and has the deformities in her feet and hands associated with this condition.  She relates pain when the toes rub against each other and when the nails grow long and cause pressure against her skin.  She is also diabetic.    Patient Active Problem List   Diagnosis Date Noted   Arthralgia of lower leg 02/24/2021   On long term drug therapy 02/24/2021   Myalgia 02/24/2021   Anxiety state 02/24/2021   Diverticular disease of colon 02/24/2021   Dysphagia 02/24/2021   Essential tremor 02/24/2021   Flatulence, eructation and gas pain 02/24/2021   History of colonic polyps 02/24/2021   Mixed hyperlipidemia 02/24/2021   Pure hypercholesterolemia 02/24/2021   Vitamin B12 deficiency 02/24/2021   Vitamin D deficiency 02/24/2021   Slow transit constipation 02/24/2021   COVID-19 01/28/2021   Headache, cervicogenic 01/03/2021   High risk medication use 12/06/2020   Rosacea 11/29/2020   Cervical spondylosis 04/18/2020   RUQ pain 08/01/2019   Primary osteoarthritis of first carpometacarpal joint of right hand 05/10/2019   Headache syndrome 03/09/2019   Carotid stenosis, left 09/19/2018   Osteoarthritis of left knee 05/12/2018   Benign positional vertigo 03/10/2018   History of lung cancer 11/29/2017   Difficulty sleeping 09/22/2017   Diabetes ([place]) 05/13/2017   Fatigue 05/12/2017   CAP (community acquired pneumonia) 05/03/2017   Orthopnea 01/12/2017   GERD (gastroesophageal reflux disease) 11/09/2016   Osteoporosis 05/06/2016   Depression 05/06/2016   Hiatal hernia 05/06/2016   Hypothyroidism 05/06/2016   Tremor 10/28/2015   Memory difficulties 10/28/2015   Hoarseness 10/14/2015   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 ([place]) 05/30/2014   Diverticulitis of colon  without hemorrhage 08/07/2013   Prolapse of female pelvic organs 06/22/2013   Neck pain 03/10/2013   Vaginal atrophy 03/09/2013   Rectocele 03/09/2013   Cystocele 03/09/2013   Urinary incontinence 03/09/2013   COPD (chronic obstructive pulmonary disease) ([place]) 01/16/2011   CARCINOMA, LUNG, NONSMALL CELL 12/17/2010   Rheumatoid arthritis ([place]) 12/17/2010   Dyspnea on exertion 12/17/2010    Current Outpatient Medications on File Prior to Visit  Medication Sig Dispense Refill   acetaminophen (TYLENOL) 325 MG tablet Take 650 mg by mouth every 6 (six) hours as needed for mild pain.      albuterol (PROAIR HFA) 108 (90 Base) MCG/ACT inhaler USE 1-2 PUFFS EVERY 4-6 HOURS AS NEEDED 8.5 g 5   ALPRAZolam (XANAX) 0.25 MG tablet TAKE 1 TABLET BY MOUTH EVERY DAY AS NEEDED FOR ANXIETY 30 tablet 0   aspirin EC 81 MG tablet Take 81 mg by mouth daily.     B-D TB SYRINGE 1CC/27GX1/2" 27G X 1/2" 1 ML MISC For use with methotrexate injection weekly 25 each 1   budesonide-formoterol (SYMBICORT) 160-4.5 MCG/ACT inhaler USE 2 INHALATIONS ORALLY   TWICE DAILY 30.6 g 2   clidinium-chlordiazePOXIDE (LIBRAX) 5-2.5 MG capsule SMARTSIG:1 Capsule(s) By Mouth Every 12 Hours PRN     famotidine (PEPCID) 40 MG tablet TAKE 1 TABLET DAILY 90 tablet 3   folic acid (FOLVITE) 1 MG tablet Take 1 tablet (1 mg total) by mouth daily. 90 tablet 1   gabapentin (NEURONTIN) 100 MG capsule Take 1 capsule (100 mg total) by mouth 3 (three)  times daily. 270 capsule 3   glucose blood (ONETOUCH ULTRA) test strip Use to check blood sugars twice a day 100 each 5   ibuprofen (ADVIL,MOTRIN) 200 MG tablet Take 200 mg by mouth every 6 (six) hours as needed for headache or mild pain.     meclizine (ANTIVERT) 25 MG tablet Take 0.5-1 tablets (12.5-25 mg total) by mouth 3 (three) times daily as needed for dizziness. 60 tablet 5   Methotrexate Sodium (METHOTREXATE, PF,) 50 MG/2ML injection INJECT 0.7 MLS (17.5 MG TOTAL) INTO THE SKIN ONCE A WEEK. 4 mL  2   mupirocin ointment (BACTROBAN) 2 % Place 1 application into the nose 2 (two) times daily. 22 g 1   omeprazole (PRILOSEC) 40 MG capsule Take 40 mg by mouth daily.     ONETOUCH DELICA LANCETS 24O MISC USE TO CHECK SUGARS UP TO 4 TIMES A DAY  0   Plecanatide (TRULANCE) 3 MG TABS as needed.     sertraline (ZOLOFT) 100 MG tablet TAKE 1 TABLET BY MOUTH EVERY DAY 90 tablet 1   SPIRIVA RESPIMAT 2.5 MCG/ACT AERS USE 2 INHALATIONS ORALLY   DAILY 12 g 1   SYNTHROID 88 MCG tablet TAKE 1 TABLET DAILY 90 tablet 1   tiZANidine (ZANAFLEX) 2 MG tablet Take 1 tablet (2 mg total) by mouth 2 (two) times daily. 60 tablet 1   traMADol (ULTRAM) 50 MG tablet Take 50 mg by mouth 3 (three) times daily as needed for moderate pain.   0   triamcinolone ointment (KENALOG) 0.1 % Apply 1 application topically 2 (two) times daily as needed. 30 g 3   Vitamin D, Cholecalciferol, 10 MCG (400 UNIT) CHEW Chew by mouth daily. Unsure of current dose     No current facility-administered medications on file prior to visit.    Allergies  Allergen Reactions   Codeine     REACTION: hallucinations   Demerol    Meperidine Hcl     REACTION: severe GI upset   Meperidine Hcl    Other Other (See Comments)   Sulfa Antibiotics Rash    Review of Systems No fevers, chills, nausea, muscle aches, no difficulty breathing, no calf pain, no chest pain or shortness of breath.   Physical Exam  GENERAL APPEARANCE: Alert, conversant. Appropriately groomed. No acute distress.   VASCULAR: Pedal pulses palpable DP and PT bilateral.  Capillary refill time is immediate to all digits,  Proximal to distal cooling it warm to warm.  Digital perfusion adequate.   NEUROLOGIC: sensation is decreased  to 5.07 monofilament at 3/5 sites bilateral.  Light touch is intact bilateral, vibratory sensation absent bilateral  MUSCULOSKELETAL: Contracture deformity of digits 2 through 5 noted bilateral.  With more significant contracture of 2 and 3 noted  bilateral. Prior surgery second toes has been performed in the past.  Bunion deformities are also present bilateral.  As well as prominent plantarflexed metatarsals are noted bilateral.  DERMATOLOGIC: skin is warm, supple, and dry.  Large callus submetatarsal 3/4 of the right foot is noted.  Hyperkeratotic skin also present submet 1 and 5 of the left foot.  Digital nails are elongated, brittle and discolored with pain and pressure upon palpation from dorsal to plantar.  The nails also cause pain and are at risk of lacerating the skin on the does due to the deformity of the toes.   Assessment   1. Hammer toes of both feet   2. Callus   3. Diabetes mellitus due to underlying condition  with diabetic neuropathy, unspecified whether long term insulin use ([place])   4. Dermatophytosis of nail       Plan  Discussed exam findings with the patient and her daughter.  I recommended trying lambswool between the toes as well as padding options. For the nails- I recommended periodic debridement.  Today I trimmed the nails with a nail nipper and smoothed with a power burr.   I also pared the callus on the right foot with a 15 blade to a safe level and applied an offloading pad to the area.  She will be seen back in 3 months for routine care and will call sooner if any problems arise.

## 2021-09-28 NOTE — Progress Notes (Deleted)
Office Visit Note  Patient: Lisa Vega             Date of Birth: [birth year]           MRN: [id number]             PCP: Binnie Rail, MD Referring: Binnie Rail, MD Visit Date: 09/29/2021   Subjective:  No chief complaint on file.   History of Present Illness: Lisa Vega is a 87 y.o. female here for follow up for rheumatoid arthritis on methotrexate 17.5 mg [place] weekly and folic acid 1 mg. ***   Previous HPI 06/25/21 Lisa Vega is a 87 y.o. female with a history of non-small cell lung cancer status post surgical resection and chemotherapy, hypothyroidism, osteoporosis, generalized osteoarthritis, and COPD here to establish care for rheumatoid arthritis currently treated with methotrexate subcutaneous 17.5 mg weekly.  She was originally diagnosed around 40 years ago in [place] she to treatment with gold injections that were not effective.  She later treated with oral methotrexate that was poorly tolerated with rashes at even low-dose.  She was switched to subcutaneous methotrexate which she has been taking for at least 20 years.  Since moving to this area she is seeing rheumatology care with Dr. Justine Null, Dr. Rockwell [place], Dr. Trudie Reed, and now seeks to transfer care for an office that is closer to her daughter's home where she has started living due to increased difficulties with independent living and commuting.   She has extensive long-term damage from her rheumatoid arthritis with subluxation deviation and deformity of the digits on both hands and both feet.  She is also had some involvement of the shoulders and wrists but not as severe.  She has degenerative back disease, cervical disc disease, and bilateral osteoarthritis of the knees.  The trouble with her knees especially with ulcer and deformity of the right foot significantly limit her mobility at this time.  She has not noticed a lot of joint swelling redness or heat recently and has not required recent steroid treatments.   She has had some benefit with intra-articular injections for the knees also had some injections for the neck and upper back with mixed results.  She also describes some nodules in the skin of her upper arms and on her hands that she thinks are more noticeable over the past several months.   She has a history of non-small cell lung cancer that was found with a rapidly enlarging left lung nodule.  She was a former cigarette smoker with COPD but no longer.  She denies a known history of interstitial lung disease but has discussed difficulty say whether methotrexate has contributed a lung toxicity versus just her RA.   No Rheumatology ROS completed.   PMFS History:  Patient Active Problem List   Diagnosis Date Noted   Arthralgia of lower leg 02/24/2021   On long term drug therapy 02/24/2021   Myalgia 02/24/2021   Anxiety state 02/24/2021   Diverticular disease of colon 02/24/2021   Dysphagia 02/24/2021   Essential tremor 02/24/2021   Flatulence, eructation and gas pain 02/24/2021   History of colonic polyps 02/24/2021   Mixed hyperlipidemia 02/24/2021   Pure hypercholesterolemia 02/24/2021   Vitamin B12 deficiency 02/24/2021   Vitamin D deficiency 02/24/2021   Slow transit constipation 02/24/2021   COVID-19 01/28/2021   Headache, cervicogenic 01/03/2021   High risk medication use 12/06/2020   Rosacea 11/29/2020   Cervical spondylosis 04/18/2020   RUQ pain 08/01/2019  Primary osteoarthritis of first carpometacarpal joint of right hand 05/10/2019   Headache syndrome 03/09/2019   Carotid stenosis, left 09/19/2018   Osteoarthritis of left knee 05/12/2018   Benign positional vertigo 03/10/2018   History of lung cancer 11/29/2017   Difficulty sleeping 09/22/2017   Diabetes ([place]) 05/13/2017   Fatigue 05/12/2017   CAP (community acquired pneumonia) 05/03/2017   Orthopnea 01/12/2017   GERD (gastroesophageal reflux disease) 11/09/2016   Osteoporosis 05/06/2016   Depression 05/06/2016    Hiatal hernia 05/06/2016   Hypothyroidism 05/06/2016   Tremor 10/28/2015   Memory difficulties 10/28/2015   Hoarseness 10/14/2015   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 ([place]) 05/30/2014   Diverticulitis of colon without hemorrhage 08/07/2013   Prolapse of female pelvic organs 06/22/2013   Neck pain 03/10/2013   Vaginal atrophy 03/09/2013   Rectocele 03/09/2013   Cystocele 03/09/2013   Urinary incontinence 03/09/2013   COPD (chronic obstructive pulmonary disease) ([place]) 01/16/2011   CARCINOMA, LUNG, NONSMALL CELL 12/17/2010   Rheumatoid arthritis ([place]) 12/17/2010   Dyspnea on exertion 12/17/2010    Past Medical History:  Diagnosis Date   Anxiety    Arthritis    RA   BPV (benign positional vertigo) 03/10/2018   Cancer ([place])    lung ca   Carotid stenosis, left 09/19/2018   COPD (chronic obstructive pulmonary disease) ([facility])    Depression    Dyspnea    with exertion   GERD (gastroesophageal reflux disease)    Headache syndrome 03/09/2019   Hypercholesteremia    Hypothyroidism    Memory difficulties 10/28/2015   Pneumonia     x3  last time 2017   Pre-diabetes    Rheumatoid arthritis(714.0)    Thyroid disease    Tremor 10/28/2015   Jaw tremor   Uterine prolapse    Vitamin D deficiency     Family History  Problem Relation Age of Onset   Emphysema Mother    Asthma Mother    Lung cancer Father    Pancreatic cancer Father    Bone cancer Father    Heart disease Brother        x1   Diabetes Brother    Hypertension Brother    Lupus Brother    Heart disease Sister        x2   Diabetes Sister    Hypertension Sister    Diabetes Sister    Hypertension Sister    Diabetes Brother    Hypertension Brother    Stroke Maternal Grandfather    Hypertension Son    Tremor Son    Past Surgical History:  Procedure Laterality Date   ANGIOPLASTY Left 10/10/2018   Procedure: ANGIOPLASTY using 1cm x 14cm Xenosure Patch;  Surgeon: Marty Heck, MD;  Location: [facility]  OR;  Service: Vascular;  Laterality: Left;   APPENDECTOMY     COLONOSCOPY W/ POLYPECTOMY     ENDARTERECTOMY Left 10/10/2018   Procedure: ENDARTERECTOMY CAROTID;  Surgeon: Marty Heck, MD;  Location: [facility] OR;  Service: Vascular;  Laterality: Left;   EYE SURGERY Bilateral    cataract   FOOT SURGERY Bilateral    arthritis   Hands  Bilateral    Arthritis   HEMORRHOID SURGERY     right lobectomy  10/2004   TONSILLECTOMY AND ADENOIDECTOMY     Social History   Social History Narrative   09/09/21 lives with dgtr Lovey Newcomer; widowed   Patient drinks about 3 cups of coffee daily.   Patient is right handed.  Retired Corporate treasurer at [employer] History  Administered Date(s) Administered   Fluad Quad(high Dose 65+) 12/08/2019, 10/31/2020   Influenza Split 10/18/2009, 09/28/2011, 10/28/2011, 09/28/2015, 10/09/2015   Influenza Whole 09/27/2012   Influenza, High Dose Seasonal PF 10/28/2016, 01/12/2017, 09/22/2017, 10/03/2018   Influenza,inj,Quad PF,6+ Mos 09/20/2013, 09/12/2014   Pneumococcal Conjugate-13 10/24/2015, 10/31/2020   Pneumococcal Polysaccharide-23 12/29/2007, 07/10/2008     Objective: Vital Signs: There were no vitals taken for this visit.   Physical Exam   Musculoskeletal Exam: ***  CDAI Exam: CDAI Score: -- Patient Global: --; Provider Global: -- Swollen: --; Tender: -- Joint Exam 09/29/2021   No joint exam has been documented for this visit   There is currently no information documented on the homunculus. Go to the Rheumatology activity and complete the homunculus joint exam.  Investigation: No additional findings.  Imaging: No results found.  Recent Labs: Lab Results  Component Value Date   WBC 5.5 06/25/2021   HGB 12.9 06/25/2021   PLT 129 (L) 06/25/2021   NA 138 06/25/2021   K 4.6 06/25/2021   CL 101 06/25/2021   CO2 31 06/25/2021   GLUCOSE 91 06/25/2021   BUN 12 06/25/2021   CREATININE 0.95 (H) 06/25/2021   BILITOT 0.7  06/25/2021   ALKPHOS 86 06/02/2021   AST 29 06/25/2021   ALT 13 06/25/2021   PROT 6.9 06/25/2021   ALBUMIN 3.7 06/02/2021   CALCIUM 9.2 06/25/2021   GFRAA 63 06/25/2021    Speciality Comments: No specialty comments available.  Procedures:  No procedures performed Allergies: Codeine, Demerol, Meperidine hcl, Meperidine hcl, Other, and Sulfa antibiotics   Assessment / Plan:     Visit Diagnoses: No diagnosis found.  ***  Orders: No orders of the defined types were placed in this encounter.  No orders of the defined types were placed in this encounter.    Follow-Up Instructions: No follow-ups on file.   Collier Salina, MD  Note - This record has been created using [employer].  Chart creation errors have been sought, but may not always  have been located. Such creation errors do not reflect on  the standard of medical care.

## 2021-09-29 ENCOUNTER — Ambulatory Visit: Payer: Medicare Other | Admitting: Internal Medicine

## 2021-09-30 ENCOUNTER — Ambulatory Visit (INDEPENDENT_AMBULATORY_CARE_PROVIDER_SITE_OTHER): Payer: Medicare Other | Admitting: Pharmacist

## 2021-09-30 ENCOUNTER — Other Ambulatory Visit: Payer: Self-pay

## 2021-09-30 DIAGNOSIS — E119 Type 2 diabetes mellitus without complications: Secondary | ICD-10-CM

## 2021-09-30 DIAGNOSIS — J449 Chronic obstructive pulmonary disease, unspecified: Secondary | ICD-10-CM | POA: Diagnosis not present

## 2021-09-30 DIAGNOSIS — F418 Other specified anxiety disorders: Secondary | ICD-10-CM

## 2021-09-30 DIAGNOSIS — M069 Rheumatoid arthritis, unspecified: Secondary | ICD-10-CM | POA: Diagnosis not present

## 2021-09-30 DIAGNOSIS — G4489 Other headache syndrome: Secondary | ICD-10-CM

## 2021-09-30 NOTE — Progress Notes (Signed)
Chronic Care Management Pharmacy Note  09/30/2021 Name:  Lisa Vega MRN:  [id number] DOB:  1932-10-24  Summary: -Pt reports she is in an RA flare but it is not bad enough to contact rheumatology for prednisone -Pt is not taking Spiriva consistently due to cost; she reports breathing is under control and she does not wish to pursue pt assistance  Recommendations/Changes made from today's visit: -No med changes -Advised flu shot this month; pt declined other vaccines   Subjective: Lisa Vega is an 87 y.o. year old female who is a primary patient of Burns, [name], MD.  The CCM team was consulted for assistance with disease management and care coordination needs.    Engaged with patient by telephone for follow up visit in response to provider referral for pharmacy case management and/or care coordination services.   Consent to Services:  The patient was given information about Chronic Care Management services, agreed to services, and gave verbal consent prior to initiation of services.  Please see initial visit note for detailed documentation.   Patient Care Team: Binnie Rail, MD as PCP - General (Internal Medicine) Wellington Hampshire, MD as PCP - Cardiology (Cardiology) Clarene Essex, MD (Gastroenterology) Izora Gala, MD as Consulting Physician (Otolaryngology) Lenon Oms, MD as Referring Physician (Obstetrics and Gynecology) Juanito Doom, MD as Consulting Physician (Pulmonary Disease) Curt Bears, MD as Consulting Physician (Oncology) Gavin Pound, MD as Consulting Physician (Rheumatology) Marilynne Halsted, MD as Referring Physician (Ophthalmology) Marty Heck, MD as Consulting Physician (Vascular Surgery) Kathrynn Ducking, MD as Consulting Physician (Neurology) Charlton Haws, [facility] as Pharmacist (Pharmacist)  Recent office visits: 09/12/21 AWV 06/02/21 Dr Quay Burow OV: chronic f/u; TSH low, will just monitor.  Recent consult  visits: 09/19/21 Dr Valentina Lucks (podiatry) f/u hammer toes 09/16/21 NP Juleen China (gynecology): pessary re-insertion 09/09/21 Dr Jannifer Franklin (neurology): f/u headache. Rx'd tizanidine, continue gabapentin. 08/18/21 Dr Doran Durand (ortho): f/u RA 08/13/21 NP Juleen China (gynecology): UTI - rx'd macrobid, fluconazole 07/02/21 NP Juleen China (gynecology): pessary maintenance. Rx'd fluconazole 06/25/21 Dr Benjamine Mola (rheumatology): f/u RA. Continue MTX injection 17.5 mg weekly  Hospital visits: None in previous 6 months   Objective:  Lab Results  Component Value Date   CREATININE 0.95 (H) 06/25/2021   BUN 12 06/25/2021   GFR 52.84 (L) 06/02/2021   GFRNONAA 54 (L) 06/25/2021   GFRAA 63 06/25/2021   NA 138 06/25/2021   K 4.6 06/25/2021   CALCIUM 9.2 06/25/2021   CO2 31 06/25/2021   GLUCOSE 91 06/25/2021    Lab Results  Component Value Date/Time   HGBA1C 5.7 06/02/2021 02:12 PM   HGBA1C 5.9 02/02/2020 11:44 AM   GFR 52.84 (L) 06/02/2021 02:12 PM   GFR 54.53 (L) 02/02/2020 11:44 AM   MICROALBUR 2.2 (H) 06/02/2021 02:12 PM   MICROALBUR <0.7 02/02/2020 11:44 AM    Last diabetic Eye exam: No results found for: HMDIABEYEEXA  Last diabetic Foot exam: No results found for: HMDIABFOOTEX   Lab Results  Component Value Date   CHOL 195 06/02/2021   HDL 58.30 06/02/2021   LDLCALC 112 (H) 06/02/2021   TRIG 124.0 06/02/2021   CHOLHDL 3 06/02/2021    Hepatic Function Latest Ref Rng & Units 06/25/2021 06/02/2021 03/26/2021  Total Protein 6.1 - 8.1 g/dL 6.9 6.8 6.5  Albumin 3.5 - 5.2 g/dL - 3.7 -  AST 10 - 35 U/L '29 27 19  ' ALT 6 - 29 U/L '13 12 9  ' Alk Phosphatase 39 - 117 U/L - 86 -  Total Bilirubin 0.2 - 1.2 mg/dL 0.7 0.5 0.6  Bilirubin, Direct 0.00 - 0.40 mg/dL - - -    Lab Results  Component Value Date/Time   TSH 0.34 (L) 06/02/2021 02:12 PM   TSH 1.02 02/02/2020 11:44 AM    CBC Latest Ref Rng & Units 06/25/2021 03/26/2021 12/06/2020  WBC 3.8 - 10.8 Thousand/uL 5.5 4.8 4.8  Hemoglobin 11.7 - 15.5 g/dL 12.9 12.7  13.3  Hematocrit 35.0 - 45.0 % 39.9 39.7 40.3  Platelets 140 - 400 Thousand/uL 129(L) 220 177    Lab Results  Component Value Date/Time   VD25OH 35 03/26/2021 01:42 PM   VD25OH 29 (L) 12/06/2020 11:35 AM   VD25OH 25.86 (L) 05/12/2017 04:06 PM    Clinical ASCVD: No  The ASCVD Risk score (Arnett DK, et al., 2019) failed to calculate for the following reasons:   The 2019 ASCVD risk score is only valid for ages 64 to 31    Depression screen PHQ 2/9 09/12/2021 06/02/2021 11/29/2020  Decreased Interest 0 1 1  Down, Depressed, Hopeless 0 0 0  PHQ - 2 Score 0 1 1  Altered sleeping - 0 0  Tired, decreased energy - 0 1  Change in appetite - 0 0  Feeling bad or failure about yourself  - 0 0  Trouble concentrating - 0 0  Moving slowly or fidgety/restless - 0 0  Suicidal thoughts - 0 0  PHQ-9 Score - 1 2  Difficult doing work/chores - Not difficult at all Not difficult at all  Some recent data might be hidden     Social History   Tobacco Use  Smoking Status Former   Packs/day: 1.00   Years: 20.00   Pack years: 20.00   Types: Cigarettes   Quit date: 11/10/2004   Years since quitting: 16.8  Smokeless Tobacco Never   BP Readings from Last 3 Encounters:  09/16/21 122/76  09/09/21 (!) 155/78  08/13/21 122/78   Pulse Readings from Last 3 Encounters:  09/09/21 64  06/25/21 63  06/02/21 64   Wt Readings from Last 3 Encounters:  09/12/21 135 lb (61.2 kg)  09/09/21 135 lb (61.2 kg)  06/25/21 137 lb 6.4 oz (62.3 kg)   BMI Readings from Last 3 Encounters:  09/12/21 23.17 kg/m  09/09/21 23.54 kg/m  06/25/21 25.13 kg/m    Assessment/Interventions: Review of patient past medical history, allergies, medications, health status, including review of consultants reports, laboratory and other test data, was performed as part of comprehensive evaluation and provision of chronic care management services.   SDOH:  (Social Determinants of Health) assessments and interventions performed:  Yes  SDOH Screenings   Alcohol Screen: Low Risk    Last Alcohol Screening Score (AUDIT): 0  Depression (PHQ2-9): Low Risk    PHQ-2 Score: 0  Financial Resource Strain: Low Risk    Difficulty of Paying Living Expenses: Not hard at all  Food Insecurity: No Food Insecurity   Worried About Charity fundraiser in the Last Year: Never true   Ran Out of Food in the Last Year: Never true  Housing: Low Risk    Last Housing Risk Score: 0  Physical Activity: Inactive   Days of Exercise per Week: 0 days   Minutes of Exercise per Session: 0 min  Social Connections: Socially Isolated   Frequency of Communication with Friends and Family: More than three times a week   Frequency of Social Gatherings with Friends and Family: Once a week   Attends  Religious Services: Never   Active Member of Clubs or Organizations: No   Attends Archivist Meetings: Never   Marital Status: Widowed  Stress: No Stress Concern Present   Feeling of Stress : Not at all  Tobacco Use: Medium Risk   Smoking Tobacco Use: Former   Smokeless Tobacco Use: Never  Transportation Needs: No Data processing manager (Medical): No   Lack of Transportation (Non-Medical): No    CCM Care Plan  Allergies  Allergen Reactions   Codeine     REACTION: hallucinations   Demerol    Meperidine Hcl     REACTION: severe GI upset   Meperidine Hcl    Other Other (See Comments)   Sulfa Antibiotics Rash    Medications Reviewed Today     Reviewed by Charlton Haws, [facility] (Pharmacist) on 09/30/21 at 1129  Med List Status: <None>   Medication Order Taking? Sig Documenting Provider Last Dose Status Informant  acetaminophen (TYLENOL) 325 MG tablet [id number] Yes Take 650 mg by mouth every 6 (six) hours as needed for mild pain.  [provider] Taking Active Self  albuterol [facility] HFA) 108 (90 Base) MCG/ACT inhaler [id number] Yes USE 1-2 PUFFS EVERY 4-6 HOURS AS NEEDED Olalere, Adewale A, MD  Taking Active   ALPRAZolam (XANAX) 0.25 MG tablet [id number] Yes TAKE 1 TABLET BY MOUTH EVERY DAY AS NEEDED FOR ANXIETY Janith Lima, MD Taking Active   aspirin EC 81 MG tablet [id number] Yes Take 81 mg by mouth daily. [provider] Taking Active   B-D TB SYRINGE 1CC/27GX1/2" 27G X 1/2" 1 ML MISC [id number] Yes For use with methotrexate injection weekly Collier Salina, MD Taking Active   budesonide-formoterol [facility]) 160-4.5 MCG/ACT inhaler [id number] Yes USE 2 INHALATIONS ORALLY   TWICE DAILY Olalere, Adewale A, MD Taking Active   clidinium-chlordiazePOXIDE (LIBRAX) 5-2.5 MG capsule [id number] Yes SMARTSIG:1 Capsule(s) By Mouth Every 12 Hours PRN [provider] Taking Active   famotidine (PEPCID) 40 MG tablet [id number] Yes TAKE 1 TABLET DAILY Burns, [name], MD Taking Active   folic acid (FOLVITE) 1 MG tablet [id number] Yes Take 1 tablet (1 mg total) by mouth daily. Collier Salina, MD Taking Active   gabapentin (NEURONTIN) 100 MG capsule [id number] Yes Take 1 capsule (100 mg total) by mouth 3 (three) times daily. Kathrynn Ducking, MD Taking Active   glucose blood [facility] ULTRA) test strip [id number] Yes Use to check blood sugars twice a day Burns, [name], MD Taking Active   ibuprofen (ADVIL,MOTRIN) 200 MG tablet [id number] Yes Take 200 mg by mouth every 6 (six) hours as needed for headache or mild pain. [provider] Taking Active Self  meclizine (ANTIVERT) 25 MG tablet [id number] Yes Take 0.5-1 tablets (12.5-25 mg total) by mouth 3 (three) times daily as needed for dizziness. Binnie Rail, MD Taking Active   Methotrexate Sodium (METHOTREXATE, PF,) 50 MG/2ML injection [id number] Yes INJECT 0.7 MLS (17.5 MG TOTAL) INTO THE SKIN ONCE A WEEK. Collier Salina, MD Taking Active   mupirocin ointment (BACTROBAN) 2 % [id number] Yes Place 1 application into the nose 2 (two) times daily. Fontaine, Belinda Block, MD Taking Active   omeprazole (PRILOSEC) 40 MG capsule  [id number] Yes Take 40 mg by mouth daily. [provider] Taking Active Self  Jonetta Speak LANCETS 02T [place] [id number] Yes USE TO CHECK SUGARS UP TO 4 TIMES A DAY [provider] Taking Active Self  Plecanatide (TRULANCE) 3 MG  TABS [id number] Yes Take 3 mg by mouth as needed. (Samples from GI) [provider] Taking Active   sertraline (ZOLOFT) 100 MG tablet [id number] Yes TAKE 1 TABLET BY MOUTH EVERY DAY Burns, [name], MD Taking Active   SPIRIVA RESPIMAT 2.5 MCG/ACT AERS [id number] Yes USE 2 INHALATIONS ORALLY   DAILY  Patient taking differently: Inhale 2 puffs into the lungs daily as needed.   Laurin Coder, MD Taking Active   SYNTHROID 88 MCG tablet [id number] Yes TAKE 1 TABLET DAILY Burns, [name], MD Taking Active   tiZANidine (ZANAFLEX) 2 MG tablet [id number] Yes Take 1 tablet (2 mg total) by mouth 2 (two) times daily. Kathrynn Ducking, MD Taking Active   traMADol Veatrice Bourbon) 50 MG tablet [id number] Yes Take 50 mg by mouth 3 (three) times daily as needed for moderate pain.  [provider] Taking Active Self  triamcinolone ointment (KENALOG) 0.1 % [id number] Yes Apply 1 application topically 2 (two) times daily as needed. Joseph Pierini, MD Taking Active   Vitamin D, Cholecalciferol, 10 MCG (400 UNIT) CHEW [id number] Yes Chew by mouth daily. Unsure of current dose [provider] Taking Active             Patient Active Problem List   Diagnosis Date Noted   Arthralgia of lower leg 02/24/2021   On long term drug therapy 02/24/2021   Myalgia 02/24/2021   Anxiety state 02/24/2021   Diverticular disease of colon 02/24/2021   Dysphagia 02/24/2021   Essential tremor 02/24/2021   Flatulence, eructation and gas pain 02/24/2021   History of colonic polyps 02/24/2021   Mixed hyperlipidemia 02/24/2021   Pure hypercholesterolemia 02/24/2021   Vitamin B12 deficiency 02/24/2021   Vitamin D deficiency 02/24/2021   Slow transit constipation  02/24/2021   COVID-19 01/28/2021   Headache, cervicogenic 01/03/2021   High risk medication use 12/06/2020   Rosacea 11/29/2020   Cervical spondylosis 04/18/2020   RUQ pain 08/01/2019   Primary osteoarthritis of first carpometacarpal joint of right hand 05/10/2019   Headache syndrome 03/09/2019   Carotid stenosis, left 09/19/2018   Osteoarthritis of left knee 05/12/2018   Benign positional vertigo 03/10/2018   History of lung cancer 11/29/2017   Difficulty sleeping 09/22/2017   Diabetes ([place]) 05/13/2017   Fatigue 05/12/2017   CAP (community acquired pneumonia) 05/03/2017   Orthopnea 01/12/2017   GERD (gastroesophageal reflux disease) 11/09/2016   Osteoporosis 05/06/2016   Depression 05/06/2016   Hiatal hernia 05/06/2016   Hypothyroidism 05/06/2016   Tremor 10/28/2015   Memory difficulties 10/28/2015   Hoarseness 10/14/2015   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 ([place]) 05/30/2014   Diverticulitis of colon without hemorrhage 08/07/2013   Prolapse of female pelvic organs 06/22/2013   Neck pain 03/10/2013   Vaginal atrophy 03/09/2013   Rectocele 03/09/2013   Cystocele 03/09/2013   Urinary incontinence 03/09/2013   COPD (chronic obstructive pulmonary disease) ([place]) 01/16/2011   CARCINOMA, LUNG, NONSMALL CELL 12/17/2010   Rheumatoid arthritis ([place]) 12/17/2010   Dyspnea on exertion 12/17/2010    Immunization History  Administered Date(s) Administered   Fluad Quad(high Dose 65+) 12/08/2019, 10/31/2020   Influenza Split 10/18/2009, 09/28/2011, 10/28/2011, 09/28/2015, 10/09/2015   Influenza Whole 09/27/2012   Influenza, High Dose Seasonal PF 10/28/2016, 01/12/2017, 09/22/2017, 10/03/2018   Influenza,inj,Quad PF,6+ Mos 09/20/2013, 09/12/2014   Pneumococcal Conjugate-13 10/24/2015, 10/31/2020   Pneumococcal Polysaccharide-23 12/29/2007, 07/10/2008    Conditions to be addressed/monitored:  Diabetes, COPD, Chronic Kidney Disease, and Depression, rheumatoid arthritis,  Constipation, Headaches  Care  Plan : [place]  Updates made by Charlton Haws, RPH since 09/30/2021 12:00 AM     Problem: Diabetes, COPD, Chronic Kidney Disease, and Depression, rheumatoid arthritis, Constipation, Headaches   Priority: High     Long-Range Goal: Disease management   Start Date: 09/30/2021  Expected End Date: 09/30/2022  This Visit's Progress: On track  Priority: High  Note:   Current Barriers:  Unable to independently monitor therapeutic efficacy  Pharmacist Clinical Goal(s):  Patient will achieve adherence to monitoring guidelines and medication adherence to achieve therapeutic efficacy through collaboration with PharmD and provider.   Interventions: 1:1 collaboration with Binnie Rail, MD regarding development and update of comprehensive plan of care as evidenced by provider attestation and co-signature Inter-disciplinary care team collaboration (see longitudinal plan of care) Comprehensive medication review performed; medication list updated in electronic medical record  Diabetes    A1c goal <7% Checking BG: Weekly Diet-controlled  We discussed: A1c is in prediabetic range, improved since last year   Plan: Continue control with diet   COPD    Last spirometry score 08/19/2018: -FEV1 75% predicted -FEV1/FVC 0.62 Gold Grade: Gold 2 (FEV1 50-79%) Current COPD Classification:  C (low sx, >/=2 exacerbations/yr)   Patient has failed these meds in past: n/a Patient is currently controlled on the following medications:  Spiriva Respimat 2 puffs daily - takes PRN due to cost Symbicort 160-4.5 mcg/act 2 puffs BID  Albuterol HFA prn   Using maintenance inhaler regularly? Symbicort - yes; Spiriva - no Frequency of rescue inhaler use:  infrequent   We discussed:  pt takes Symbicort as prescribed, but takes Spiriva PRN due to higher cost ($65 per 3 months); pt reports breathing is relatively controlled, she denies SOB or wheezing; she is not  using albuterol inhaler often which indicates good symptom control   Plan: Continue current medications   Constipation    Patient has failed these meds in past: [place] Patient is currently controlled on the following medications:  Chlordiazepoxide-Clidinium (Librax) 2.5-5 mg q12h PRN Trulance 3 mg PRN Docusate 100 mg daily   We discussed:  Pt reports she has had constipation issues all her life; she reports both Linzess and Trulance work but she cannot afford them; she currently is using samples from GI office; pt does not wish to pursue pt assistance; encouraged pt to reach out to GI regarding more samples   Plan: Continue current medications   Depression / Anxiety  Patient has failed these meds in past: n/a Patient is currently controlled on the following medications:  Sertraline 100 mg daily Alprazolam 0.25 mg daily PRN   We discussed:  Pt reports she uses alprazolam infrequently; her rx for #40 pills has lasted > 6 months. She denies issues with sertraline.   Plan: Continue current medications   Rheuamtoid arthritis    Patient has failed these meds in past: n/a Patient is currently controlled on the following medications:  Methotexate 17.5 mg injection weekly Folic acid 1 mg daily Ibuprofen 200 mg q6h PRN Tramadol 50 mg TID prn - knee pain    We discussed:  Patient reports she is in a RA flare right now, she reports it is currently not bad enough to call rheumatology for prednisone; she also reports she gets cortisone injections in 1 knee from Dr Nelva Bush occasionally, her other knee is bothering her and she wants to know what can be done; encouraged her to contact Dr Nelva Bush about injections for the other knee  Plan: Continue current medications; contact rheumatology if arthritis flare worsens   Headache    Patient has failed these meds in past: duloxetine, Baclofen, pregabalin Patient is currently controlled on the following medications:  Gabapentin 100 mg TID (tremors,  headache, burning) Tylenol 325 mg q6h prn Tizanidine 2 mg PRN   We discussed:  Pt reports headaches are currently under control   Plan: Continue current medications  Health Maintenance    -Vaccine gaps: covid, shingrix, TDAP, flu -Pt is planning to get flu vaccine this month; she declines other vaccines  Patient Goals/Self-Care Activities Patient will:  - take medications as prescribed -focus on medication adherence by routine -contact GI for Trulance samples -Contact Dr Nelva Bush about cortisone injections for both knees      Medication Assistance: None required.  Patient affirms current coverage meets needs.  Compliance/Adherence/Medication fill history: Care Gaps: None  Star-Rating Drugs: None  Patient's preferred pharmacy is:  [id number] [place] [id number]: [phone]: [phone] [facility] [place]: [phone]: [phone] Uses pill box? Yes Pt endorses 100% compliance  We discussed: Current pharmacy is preferred with insurance plan and patient is satisfied with pharmacy services Patient decided to: Continue current medication management strategy  Care Plan and Follow Up Patient Decision:  Patient agrees to Care Plan and Follow-up.  Plan: Telephone follow up appointment with care management team member scheduled for:  1 year  LCharlene Brooke PharmD, BZ[place] CPP Clinical Pharmacist [facility] [phone]

## 2021-09-30 NOTE — Patient Instructions (Signed)
Visit Information  Phone number for Pharmacist: [phone]   Goals Addressed             This Visit's Progress    Manage Pain-Arthritis       Timeframe:  Long-Range Goal Priority:  High Start Date:       09/30/21                      Expected End Date:       09/30/22                Follow Up Date Jan 2023   - call for medicine refill 2 or 3 days before it runs out - prioritize tasks for the day - track what makes the pain worse and what makes it better - use ice or heat for pain relief - work slower and less intense when having pain  -Contact Dr Nelva Bush about cortisone injections for both knees   Why is this important?   Day-to-day life can be hard when you have joint pain.  Pain medicine is just one piece of the treatment puzzle. There are many things you can do to manage pain and stay strong.   Lifestyle changes like stopping smoking and eating foods with Vitamin D and calcium keeps your bones and muscles healthy. Your joints are better when supported by strong muscles.  You can try these action steps to help you manage your pain.     Notes:         Care Plan : [place]  Updates made by Charlton Haws, RPH since 09/30/2021 12:00 AM     Problem: Diabetes, COPD, Chronic Kidney Disease, and Depression, rheumatoid arthritis, Constipation, Headaches   Priority: High     Long-Range Goal: Disease management   Start Date: 09/30/2021  Expected End Date: 09/30/2022  This Visit's Progress: On track  Priority: High  Note:   Current Barriers:  Unable to independently monitor therapeutic efficacy  Pharmacist Clinical Goal(s):  Patient will achieve adherence to monitoring guidelines and medication adherence to achieve therapeutic efficacy through collaboration with PharmD and provider.   Interventions: 1:1 collaboration with Binnie Rail, MD regarding development and update of comprehensive plan of care as evidenced by provider attestation and  co-signature Inter-disciplinary care team collaboration (see longitudinal plan of care) Comprehensive medication review performed; medication list updated in electronic medical record  Diabetes    A1c goal <7% Checking BG: Weekly Diet-controlled  We discussed: A1c is in prediabetic range, improved since last year   Plan: Continue control with diet   COPD    Last spirometry score 08/19/2018: -FEV1 75% predicted -FEV1/FVC 0.62 Gold Grade: Gold 2 (FEV1 50-79%) Current COPD Classification:  C (low sx, >/=2 exacerbations/yr)   Patient has failed these meds in past: n/a Patient is currently controlled on the following medications:  Spiriva Respimat 2 puffs daily - takes PRN due to cost Symbicort 160-4.5 mcg/act 2 puffs BID  Albuterol HFA prn   Using maintenance inhaler regularly? Symbicort - yes; Spiriva - no Frequency of rescue inhaler use:  infrequent   We discussed:  pt takes Symbicort as prescribed, but takes Spiriva PRN due to higher cost ($65 per 3 months); pt reports breathing is relatively controlled, she denies SOB or wheezing; she is not using albuterol inhaler often which indicates good symptom control   Plan: Continue current medications   Constipation    Patient has failed these meds in past: Linzess Patient  is currently controlled on the following medications:  Chlordiazepoxide-Clidinium (Librax) 2.5-5 mg q12h PRN Trulance 3 mg PRN Docusate 100 mg daily   We discussed:  Pt reports she has had constipation issues all her life; she reports both Linzess and Trulance work but she cannot afford them; she currently is using samples from GI office; pt does not wish to pursue pt assistance; encouraged pt to reach out to GI regarding more samples   Plan: Continue current medications   Depression / Anxiety  Patient has failed these meds in past: n/a Patient is currently controlled on the following medications:  Sertraline 100 mg daily Alprazolam 0.25 mg daily PRN    We discussed:  Pt reports she uses alprazolam infrequently; her rx for #40 pills has lasted > 6 months. She denies issues with sertraline.   Plan: Continue current medications   Rheuamtoid arthritis    Patient has failed these meds in past: n/a Patient is currently controlled on the following medications:  Methotexate 17.5 mg injection weekly Folic acid 1 mg daily Ibuprofen 200 mg q6h PRN Tramadol 50 mg TID prn - knee pain    We discussed:  Patient reports she is in a RA flare right now, she reports it is currently not bad enough to call rheumatology for prednisone; she also reports she gets cortisone injections in 1 knee from Dr Nelva Bush occasionally, her other knee is bothering her and she wants to know what can be done; encouraged her to contact Dr Nelva Bush about injections for the other knee   Plan: Continue current medications; contact rheumatology if arthritis flare worsens   Headache    Patient has failed these meds in past: duloxetine, Baclofen, pregabalin Patient is currently controlled on the following medications:  Gabapentin 100 mg TID (tremors, headache, burning) Tylenol 325 mg q6h prn Tizanidine 2 mg PRN   We discussed:  Pt reports headaches are currently under control   Plan: Continue current medications  Health Maintenance    -Vaccine gaps: covid, shingrix, TDAP, flu -Pt is planning to get flu vaccine this month; she declines other vaccines  Patient Goals/Self-Care Activities Patient will:  - take medications as prescribed -focus on medication adherence by routine -contact GI for Trulance samples -Contact Dr Nelva Bush about cortisone injections for both knees      Patient verbalizes understanding of instructions provided today and agrees to view in [place].  Telephone follow up appointment with pharmacy team member scheduled for: 1 year  Charlene Brooke, PharmD, [place], CPP Clinical Pharmacist [facility] [phone]

## 2021-10-01 ENCOUNTER — Ambulatory Visit: Payer: Medicare Other | Admitting: Nurse Practitioner

## 2021-10-03 ENCOUNTER — Other Ambulatory Visit: Payer: Self-pay | Admitting: Neurology

## 2021-10-07 ENCOUNTER — Telehealth: Payer: Self-pay

## 2021-10-07 NOTE — Telephone Encounter (Signed)
Yes that's fine if she is available this afternoon.

## 2021-10-07 NOTE — Telephone Encounter (Signed)
Patient said last Thursday her pessary came half way out and she removed it. Has had some blood mostly brown but one day she saw red blood off and on and more of it that day. Not sure if blood is coming from her vagina or urinary tract.  Pain with urination since Th/Friday.

## 2021-10-07 NOTE — Telephone Encounter (Signed)
Patient said her daughter is out this afternoon and no one to bring her. Daughter can bring her around 11-12-1 tomorrow. I told her to arrive by 11:15am and you will be working her in on your schedule. Appt scheduled. Thanks!

## 2021-10-07 NOTE — Telephone Encounter (Signed)
Are you giving OK to work her in on your schedule  this afternoon? I do realize she needs visit there are no openings.

## 2021-10-07 NOTE — Telephone Encounter (Signed)
She needs OV for evaluation of UTI and possible laceration.

## 2021-10-08 ENCOUNTER — Ambulatory Visit (INDEPENDENT_AMBULATORY_CARE_PROVIDER_SITE_OTHER): Payer: Medicare Other | Admitting: Nurse Practitioner

## 2021-10-08 ENCOUNTER — Telehealth: Payer: Self-pay

## 2021-10-08 ENCOUNTER — Other Ambulatory Visit: Payer: Self-pay

## 2021-10-08 VITALS — BP 118/78

## 2021-10-08 DIAGNOSIS — R300 Dysuria: Secondary | ICD-10-CM

## 2021-10-08 DIAGNOSIS — N3000 Acute cystitis without hematuria: Secondary | ICD-10-CM

## 2021-10-08 DIAGNOSIS — N814 Uterovaginal prolapse, unspecified: Secondary | ICD-10-CM | POA: Diagnosis not present

## 2021-10-08 DIAGNOSIS — B3731 Acute candidiasis of vulva and vagina: Secondary | ICD-10-CM | POA: Diagnosis not present

## 2021-10-08 DIAGNOSIS — Z4689 Encounter for fitting and adjustment of other specified devices: Secondary | ICD-10-CM

## 2021-10-08 DIAGNOSIS — N8189 Other female genital prolapse: Secondary | ICD-10-CM | POA: Diagnosis not present

## 2021-10-08 MED ORDER — NITROFURANTOIN MONOHYD MACRO 100 MG PO CAPS: 100.0000 mg | ORAL_CAPSULE | Freq: Two times a day (BID) | ORAL | 0 refills | Status: DC

## 2021-10-08 MED ORDER — FLUCONAZOLE 150 MG PO TABS: 150.0000 mg | ORAL_TABLET | Freq: Once | ORAL | 0 refills | Status: AC

## 2021-10-08 NOTE — Telephone Encounter (Signed)
Patient had notified me yesterday that she has appt scheduled with Andre Lefort, MD on 10/21/21. She asked me to forward records.  Today when she was in with visit I had her complete medical records release form for this.  Records will be faxed once today's office note is complete.

## 2021-10-08 NOTE — Telephone Encounter (Signed)
Lisa Gammon, NP  [facility] Triage  Please let Wyona know her urine came back showing probable infection. I have sent Macrobid to her pharmacy, as well as a Diflucan tablet since she is prone to yeast infections with antibiotic use. We will reach back out once her culture is in. Thank you.   Per DPR access note on file I left a detailed message in her voice mail with this information.

## 2021-10-08 NOTE — Progress Notes (Signed)
   Acute Office Visit  Subjective:    Patient ID: Lisa Vega, female    DOB: Jul 17, 1932, 87 y.o.   MRN: [id number]   HPI 87 y.o. presents today for pessary reinsertion, burning with urination, and difficulty urinating. Pessary came halfway out a few weeks ago, so patient removed. She had some pain with removal. Following this she had brown discharge and a very small amount of blood on panty liner. She is scheduled to see Urogynecology the end of this month but would like pessary reinserted until then.    Review of Systems  Constitutional: Negative.   Genitourinary:  Positive for difficulty urinating and dysuria. Negative for flank pain, frequency, hematuria, urgency and vaginal discharge.      Objective:    Physical Exam Constitutional:      Appearance: Normal appearance.  Genitourinary:    General: Normal vulva.     Vagina: Normal.     Uterus: With uterine prolapse.      Comments: No evidence of laceration from removal, no excoriation or evidence of bleeding. Normal discharge.    BP 118/78  Wt Readings from Last 3 Encounters:  09/12/21 135 lb (61.2 kg)  09/09/21 135 lb (61.2 kg)  06/25/21 137 lb 6.4 oz (62.3 kg)   UA leukocytes 2+, nitrate negative, trace blood, SG 1.015, Dark yellow/cloudy. Microscopic: wbc 10-26, rbc 0-2, many bacteria     Assessment & Plan:   Problem List Items Addressed This Visit   None Visit Diagnoses     Pessary maintenance    -  Primary   Uterine prolapse       Pelvic relaxation       Burning with urination       Relevant Orders   Urinalysis w microscopic + reflex cultur   Difficulty urinating       Acute cystitis without hematuria       Relevant Medications   nitrofurantoin, macrocrystal-monohydrate, (MACROBID) 100 MG capsule   Vaginal candidiasis       Relevant Medications   fluconazole (DIFLUCAN) 150 MG tablet   nitrofurantoin, macrocrystal-monohydrate, (MACROBID) 100 MG capsule      Plan: UA suggestive of acute cystitis -  Nitrofurantoin 100 mg BID x 7 days. She is prone to yeast infections with antibiotic use so Diflucan x 1 provided. Culture pending. Pessary inserted with ease.      Tamela Gammon DNP, 12:39 PM 10/08/2021

## 2021-10-09 NOTE — Telephone Encounter (Signed)
I called patient to confirm she received my message yesterday. She said she did not. I did leave her a detailed message in voice mail yesterday afternoon. I read her Tiffany's message and where Rx was sent. I told her we will be in touch when urine culture result is final.

## 2021-10-10 LAB — CULTURE INDICATED

## 2021-10-10 LAB — URINE CULTURE
MICRO NUMBER:: 12493467
SPECIMEN QUALITY:: ADEQUATE

## 2021-10-10 LAB — URINALYSIS W MICROSCOPIC + REFLEX CULTURE
Bilirubin Urine: NEGATIVE
Glucose, UA: NEGATIVE
Hyaline Cast: NONE SEEN /LPF
Ketones, ur: NEGATIVE
Nitrites, Initial: NEGATIVE
Protein, ur: NEGATIVE
Specific Gravity, Urine: 1.015 (ref 1.001–1.035)
pH: 6 (ref 5.0–8.0)

## 2021-10-13 ENCOUNTER — Telehealth: Payer: Self-pay | Admitting: Neurology

## 2021-10-13 MED ORDER — GABAPENTIN 100 MG PO CAPS: 100.0000 mg | ORAL_CAPSULE | Freq: Three times a day (TID) | ORAL | 3 refills | Status: DC

## 2021-10-13 NOTE — Telephone Encounter (Signed)
Pt has reached out to  [facility] and they are telling her they haven't received a refill request for her 90 day gabapentin (NEURONTIN) 100 MG capsule.  Pt is asking for the refill thru them instead of a 30 day thru her local pharmacy.

## 2021-10-13 NOTE — Telephone Encounter (Signed)
Called patient and LVM,  informed her [facility]abapentin Rx [facility] [place]. Left # for questions.

## 2021-10-13 NOTE — Telephone Encounter (Signed)
Pt asking now that the 90 day refill of her gabapentin (NEURONTIN) 100 MG capsule just be [id number] [place], [place]

## 2021-10-18 NOTE — Progress Notes (Signed)
Office Visit Note  Patient: Lisa Vega             Date of Birth: 15-May-1932           MRN: [id number]             PCP: Binnie Rail, MD Referring: Binnie Rail, MD Visit Date: 10/20/2021   Subjective:   History of Present Illness: Lisa Vega is a 87 y.o. female here for follow up for erosive RA on methotrexate 17.5 mg [place] weekly. Since our last visit she has significant ongoing joint pain but no severe or unbearable flare up. She has been seeing Dr. Nelva Bush for many years with good improvement to intraarticular injections and feels like it is about time, her left knee pain is acting up. She had at least one episode of right wrist swelling and erythema about a month ago. She has some urinary frequency problems evaluated recently for UTI this appeared to be negative, old pessary was recently removed and she is gong to see urology/gynecology new office tomorrow.   Previous HPI 06/25/21 Lisa Vega is a 87 y.o. female here for follow up for longstanding erosive rheumatoid arthritis on methotrexate 17.5 mg subcu weekly.  Since her last visit MRI of the left hip was obtained demonstrating nondisplaced fracture that was managed nonoperatively.  She has noticed some increase joint pain symptoms in the left knee as well which has been a problem for her in the past and sees orthopedist Dr. Herma Mering for intra-articular injections that have been beneficial previously.  She has noticed a mild not particularly painful nodule on the left finger but otherwise denies swelling or discoloration on her hand joints.    12/06/20 History of Present Illness: Lisa Vega is a 87 y.o. female with a history of non-small cell lung cancer status post surgical resection and chemotherapy, hypothyroidism, osteoporosis, and COPD here to establish care for rheumatoid arthritis currently treated with methotrexate subcutaneous 17.5 mg weekly.  She was originally diagnosed around 40 years ago in [place] she  to treatment with gold injections that were not effective.  She later treated with oral methotrexate that was poorly tolerated with rashes at even low-dose.  She was switched to subcutaneous methotrexate which she has been taking for at least 20 years.  Since moving to this area she is seeing rheumatology care with Dr. Justine Null, Dr. Rockwell Alexandria, Dr. Trudie Reed, and now seeks to transfer care for an office that is closer to her daughter's home where she has started living due to increased difficulties with independent living and commuting.   She has extensive long-term damage from her rheumatoid arthritis with subluxation deviation and deformity of the digits on both hands and both feet.  She is also had some involvement of the shoulders and wrists but not as severe.  She has degenerative back disease, cervical disc disease, and bilateral osteoarthritis of the knees.  The trouble with her knees especially with ulcer and deformity of the right foot significantly limit her mobility at this time.  She has not noticed a lot of joint swelling redness or heat recently and has not required recent steroid treatments.  She has had some benefit with intra-articular injections for the knees also had some injections for the neck and upper back with mixed results.  She also describes some nodules in the skin of her upper arms and on her hands that she thinks are more noticeable over the past several months.   She  has a history of non-small cell lung cancer that was found with a rapidly enlarging left lung nodule.  She was a former cigarette smoker with COPD but no longer.  She denies a known history of interstitial lung disease but has discussed difficulty say whether methotrexate has contributed a lung toxicity versus just her RA.   Review of Systems  Constitutional:  Positive for fatigue.  HENT:  Positive for mouth dryness. Negative for mouth sores and nose dryness.   Eyes:  Positive for dryness. Negative for pain and itching.   Respiratory:  Positive for shortness of breath and difficulty breathing.   Cardiovascular:  Negative for chest pain and palpitations.  Gastrointestinal:  Positive for constipation. Negative for blood in stool and diarrhea.  Endocrine: Positive for increased urination.  Genitourinary:  Positive for difficulty urinating.  Musculoskeletal:  Positive for joint pain, joint pain, joint swelling, myalgias, morning stiffness, muscle tenderness and myalgias.  Skin:  Positive for rash. Negative for color change.  Allergic/Immunologic: Positive for susceptible to infections.  Neurological:  Positive for dizziness, headaches and memory loss. Negative for numbness and weakness.  Hematological:  Positive for bruising/bleeding tendency.  Psychiatric/Behavioral:  Negative for confusion.     PMFS History:  Patient Active Problem List   Diagnosis Date Noted   Sinusitis 02/09/2022   Acute respiratory failure ([place]) 01/29/2022   Pain in left shoulder 01/20/2022   Pain of left hip joint 01/12/2022   Stress incontinence 11/07/2021   Urinary frequency 10/20/2021   Arthralgia of lower leg 02/24/2021   On long term drug therapy 02/24/2021   Myalgia 02/24/2021   Anxiety state 02/24/2021   Diverticular disease of colon 02/24/2021   Dysphagia 02/24/2021   Essential tremor 02/24/2021   History of colonic polyps 02/24/2021   Mixed hyperlipidemia 02/24/2021   Vitamin B12 deficiency 02/24/2021   Vitamin D deficiency 02/24/2021   Slow transit constipation 02/24/2021   COVID-19 01/28/2021   Headache, cervicogenic 01/03/2021   High risk medication use 12/06/2020   Rosacea 11/29/2020   Cervical spondylosis 04/18/2020   Primary osteoarthritis of first carpometacarpal joint of right hand 05/10/2019   Headache syndrome 03/09/2019   Carotid stenosis, left 09/19/2018   Osteoarthritis of left knee 05/12/2018   Benign positional vertigo 03/10/2018   History of lung cancer 11/29/2017   Difficulty sleeping  09/22/2017   Diabetes ([place]) 05/13/2017   Fatigue 05/12/2017   CAP (community acquired pneumonia) 05/03/2017   Orthopnea 01/12/2017   GERD (gastroesophageal reflux disease) 11/09/2016   Osteoporosis 05/06/2016   Depression 05/06/2016   Hiatal hernia 05/06/2016   Hypothyroidism 05/06/2016   Tremor 10/28/2015   Memory difficulties 10/28/2015   COPD exacerbation ([place]) 01/08/2015   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 ([place]) 05/30/2014   Diverticulitis of colon without hemorrhage 08/07/2013   Prolapse of female pelvic organs 06/22/2013   Neck pain 03/10/2013   Vaginal atrophy 03/09/2013   Rectocele 03/09/2013   Cystocele 03/09/2013   Urinary incontinence 03/09/2013   COPD (chronic obstructive pulmonary disease) ([employer]) 01/16/2011   Malignant neoplasm of bronchus and lung ([place]) 12/17/2010   Rheumatoid arthritis ([place]) 12/17/2010   Dyspnea on exertion 12/17/2010    Past Medical History:  Diagnosis Date   Anxiety    Arthritis    RA   BPV (benign positional vertigo) 03/10/2018   Cancer ([place])    lung ca   Carotid stenosis, left 09/19/2018   COPD (chronic obstructive pulmonary disease) ([facility])    Depression    Dyspnea  with exertion   GERD (gastroesophageal reflux disease)    Headache syndrome 03/09/2019   Hypercholesteremia    Hypothyroidism    Memory difficulties 10/28/2015   Pneumonia     x3  last time 2017   Pre-diabetes    Rheumatoid arthritis(714.0)    Thyroid disease    Tremor 10/28/2015   Jaw tremor   Uterine prolapse    Vitamin D deficiency     Family History  Problem Relation Age of Onset   Emphysema Mother    Asthma Mother    Lung cancer Father    Pancreatic cancer Father    Bone cancer Father    Heart disease Brother        x1   Diabetes Brother    Hypertension Brother    Lupus Brother    Heart disease Sister        x2   Diabetes Sister    Hypertension Sister    Diabetes Sister    Hypertension Sister    Diabetes Brother    Hypertension  Brother    Stroke Maternal Grandfather    Hypertension Son    Tremor Son    Past Surgical History:  Procedure Laterality Date   ANGIOPLASTY Left 10/10/2018   Procedure: ANGIOPLASTY using 1cm x 14cm Xenosure Patch;  Surgeon: Marty Heck, MD;  Location[facility];  Service: Vascular;  Laterality: Left;   APPENDECTOMY     COLONOSCOPY W/ POLYPECTOMY     ENDARTERECTOMY Left 10/10/2018   Procedure: ENDARTERECTOMY CAROTID;  Surgeon: Marty Heck, MD;  Location: [facility] OR;  Service: Vascular;  Laterality: Left;   EYE SURGERY Bilateral    cataract   FOOT SURGERY Bilateral    arthritis   Hands  Bilateral    Arthritis   HEMORRHOID SURGERY     right lobectomy  10/2004   TONSILLECTOMY AND ADENOIDECTOMY     Social History   Social History Narrative   09/09/21 lives with dgtr Lovey Newcomer; widowed   Patient drinks about 3 cups of coffee daily.   Patient is right handed.    Retired Corporate treasurer at [employer] History  Administered Date(s) Administered   Fluad Quad(high Dose 65+) 12/08/2019, 10/31/2020   Influenza Split 10/18/2009, 09/28/2011, 10/28/2011, 09/28/2015, 10/09/2015   Influenza Whole 09/27/2012   Influenza, High Dose Seasonal PF 10/28/2016, 01/12/2017, 09/22/2017, 10/03/2018   Influenza,inj,Quad PF,6+ Mos 09/20/2013, 09/12/2014   Pneumococcal Conjugate-13 10/24/2015, 10/31/2020   Pneumococcal Polysaccharide-23 12/29/2007, 07/10/2008     Objective: Vital Signs: BP 139/81 (BP Location: Left Arm, Patient Position: Sitting, Cuff Size: Small)   Pulse 63   Resp 12   Ht 5\' 3"  (1.6 m)   Wt 136 lb (61.7 kg)   BMI 24.09 kg/m    Physical Exam Cardiovascular:     Rate and Rhythm: Normal rate and regular rhythm.  Pulmonary:     Effort: Pulmonary effort is normal.     Breath sounds: Normal breath sounds.  Skin:    General: Skin is warm and dry.     Findings: No rash.  Neurological:     Mental Status: She is alert.  Psychiatric:        Mood and Affect:  Mood normal.      Musculoskeletal Exam:  Left shoulder pain with overhead abduction, passive and active ROM restricted Elbows full ROM no tenderness or swelling Wrists full ROM no tenderness or swelling MCP joints all with subluxation and lateral deviation nonreversible, swan neck deformities of  both hands Knees full ROM no tenderness or swelling, patellofemoral crepitus present b/l Ankles full ROM no tenderness or swelling  CDAI Exam: CDAI Score: 8  Patient Global: 30 mm; Provider Global: 20 mm Swollen: 0 ; Tender: 3  Joint Exam 10/20/2021      Right  Left  Glenohumeral      Tender  MCP 2   Tender     MCP 3   Tender        Investigation: No additional findings.  Imaging: No results found.  Recent Labs: Lab Results  Component Value Date   WBC 9.0 08/10/2022   HGB 12.5 08/10/2022   PLT 225.0 08/10/2022   NA 137 08/10/2022   K 3.1 (L) 08/10/2022   CL 97 08/10/2022   CO2 35 (H) 08/10/2022   GLUCOSE 159 (H) 08/10/2022   BUN 10 08/10/2022   CREATININE 0.90 08/10/2022   BILITOT 0.4 08/26/2022   ALKPHOS 87 12/02/2021   AST 29 08/26/2022   ALT 15 08/26/2022   PROT 6.3 08/26/2022   ALBUMIN 3.7 12/02/2021   CALCIUM 8.5 08/10/2022   GFRAA 63 06/25/2021    Speciality Comments: No specialty comments available.  Procedures:  No procedures performed Allergies: Codeine, Meperidine hcl, Demerol, Escitalopram, Meperidine hcl, Other, and Sulfa antibiotics   Assessment / Plan:     Visit Diagnoses: Rheumatoid arthritis involving multiple sites, unspecified whether rheumatoid factor present ([place]) - Plan: Sedimentation rate, folic acid (FOLVITE) 1 MG tablet, DISCONTINUED: Methotrexate Sodium (METHOTREXATE, PF,) 50 MG/2ML injection  Inflammatory arthritis appears to be well controlled mostly pain from chronic damage at multiple sites particularly hands and the left shoulder arthritis.  Checking sedimentation rate for disease activity monitoring.  Plan to continue methotrexate  17.5 mg subcu weekly and folic acid 1 mg daily.  High risk medication use - Plan: CBC with Differential/Platelet, COMPLETE METABOLIC PANEL WITH GFR  CBC and CMP for methotrexate medication monitoring.  Urinary frequency  Becoming more chronic she has had repeat urinalyses demonstrating bacteria and leukocytes red blood cells but most recent urine culture negative.  Is upcoming plan to see urology or urogynecology about ongoing problems.  I do not think this is an infectious problem related to her DMARD treatment.  Orders: Orders Placed This Encounter  Procedures   Sedimentation rate   CBC with Differential/Platelet   COMPLETE METABOLIC PANEL WITH GFR    Meds ordered this encounter  Medications   DISCONTD: Methotrexate Sodium (METHOTREXATE, PF,) 50 MG/2ML injection    Sig: INJECT 0.7 MLS (17.5 MG TOTAL) INTO THE SKIN ONCE A WEEK.    Dispense:  4 mL    Refill:  2   folic acid (FOLVITE) 1 MG tablet    Sig: Take 1 tablet (1 mg total) by mouth daily.    Dispense:  90 tablet    Refill:  1      Follow-Up Instructions: Return in about 3 months (around 01/20/2022) for RA on MTX f/u 77mos.   Collier Salina, MD  Note - This record has been created using [employer].  Chart creation errors have been sought, but may not always  have been located. Such creation errors do not reflect on  the standard of medical care.

## 2021-10-20 ENCOUNTER — Other Ambulatory Visit: Payer: Self-pay

## 2021-10-20 ENCOUNTER — Ambulatory Visit (INDEPENDENT_AMBULATORY_CARE_PROVIDER_SITE_OTHER): Payer: Medicare Other | Admitting: Internal Medicine

## 2021-10-20 ENCOUNTER — Encounter: Payer: Self-pay | Admitting: Internal Medicine

## 2021-10-20 VITALS — BP 139/81 | HR 63 | Resp 12 | Ht 63.0 in | Wt 136.0 lb

## 2021-10-20 DIAGNOSIS — M069 Rheumatoid arthritis, unspecified: Secondary | ICD-10-CM | POA: Diagnosis not present

## 2021-10-20 DIAGNOSIS — Z79899 Other long term (current) drug therapy: Secondary | ICD-10-CM

## 2021-10-20 DIAGNOSIS — R350 Frequency of micturition: Secondary | ICD-10-CM | POA: Diagnosis not present

## 2021-10-21 DIAGNOSIS — R82998 Other abnormal findings in urine: Secondary | ICD-10-CM | POA: Diagnosis not present

## 2021-10-21 DIAGNOSIS — R8290 Unspecified abnormal findings in urine: Secondary | ICD-10-CM | POA: Diagnosis not present

## 2021-10-21 DIAGNOSIS — N813 Complete uterovaginal prolapse: Secondary | ICD-10-CM | POA: Diagnosis not present

## 2021-10-21 DIAGNOSIS — Z9689 Presence of other specified functional implants: Secondary | ICD-10-CM | POA: Diagnosis not present

## 2021-10-21 DIAGNOSIS — R808 Other proteinuria: Secondary | ICD-10-CM | POA: Diagnosis not present

## 2021-10-21 DIAGNOSIS — N8110 Cystocele, unspecified: Secondary | ICD-10-CM | POA: Diagnosis not present

## 2021-10-21 DIAGNOSIS — R351 Nocturia: Secondary | ICD-10-CM | POA: Diagnosis not present

## 2021-10-21 DIAGNOSIS — R319 Hematuria, unspecified: Secondary | ICD-10-CM | POA: Diagnosis not present

## 2021-10-21 DIAGNOSIS — R350 Frequency of micturition: Secondary | ICD-10-CM | POA: Diagnosis not present

## 2021-10-21 LAB — COMPLETE METABOLIC PANEL WITH GFR
AG Ratio: 1.3 (calc) (ref 1.0–2.5)
ALT: 17 U/L (ref 6–29)
AST: 30 U/L (ref 10–35)
Albumin: 3.5 g/dL — ABNORMAL LOW (ref 3.6–5.1)
Alkaline phosphatase (APISO): 77 U/L (ref 37–153)
BUN: 8 mg/dL (ref 7–25)
CO2: 30 mmol/L (ref 20–32)
Calcium: 8.8 mg/dL (ref 8.6–10.4)
Chloride: 103 mmol/L (ref 98–110)
Creat: 0.85 mg/dL (ref 0.60–0.95)
Globulin: 2.7 g/dL (calc) (ref 1.9–3.7)
Glucose, Bld: 87 mg/dL (ref 65–99)
Potassium: 4.1 mmol/L (ref 3.5–5.3)
Sodium: 140 mmol/L (ref 135–146)
Total Bilirubin: 0.7 mg/dL (ref 0.2–1.2)
Total Protein: 6.2 g/dL (ref 6.1–8.1)
eGFR: 66 mL/min/{1.73_m2} (ref >=60)

## 2021-10-21 LAB — SEDIMENTATION RATE: Sed Rate: 22 mm/h (ref 0–30)

## 2021-10-21 LAB — CBC WITH DIFFERENTIAL/PLATELET
Absolute Monocytes: 781 cells/uL (ref 200–950)
Basophils Absolute: 88 cells/uL (ref 0–200)
Basophils Relative: 1.2 %
Eosinophils Absolute: 110 cells/uL (ref 15–500)
Eosinophils Relative: 1.5 %
HCT: 38.7 % (ref 35.0–45.0)
Hemoglobin: 12.9 g/dL (ref 11.7–15.5)
Lymphs Abs: 3409 cells/uL (ref 850–3900)
MCH: 32.9 pg (ref 27.0–33.0)
MCHC: 33.3 g/dL (ref 32.0–36.0)
MCV: 98.7 fL (ref 80.0–100.0)
MPV: 10.8 fL (ref 7.5–12.5)
Monocytes Relative: 10.7 %
Neutro Abs: 2913 cells/uL (ref 1500–7800)
Neutrophils Relative %: 39.9 %
Platelets: 188 10*3/uL (ref 140–400)
RBC: 3.92 10*6/uL (ref 3.80–5.10)
RDW: 14.2 % (ref 11.0–15.0)
Total Lymphocyte: 46.7 %
WBC: 7.3 10*3/uL (ref 3.8–10.8)

## 2021-10-21 MED ORDER — FOLIC ACID 1 MG PO TABS: 1.0000 mg | ORAL_TABLET | Freq: Every day | ORAL | 1 refills | Status: AC

## 2021-10-21 MED ORDER — METHOTREXATE SODIUM CHEMO INJECTION (PF) 50 MG/2ML: INTRAMUSCULAR | 2 refills | Status: DC

## 2021-10-21 NOTE — Progress Notes (Signed)
Sedimentation rate looks normal at 22 probably not much disease flare at this time. Knee and shoulder might be osteoarthritis symptoms acting up. Blood count and liver function test are normal for continuing current methotrexate dose as before.

## 2021-10-29 ENCOUNTER — Other Ambulatory Visit: Payer: Self-pay | Admitting: Neurology

## 2021-11-07 DIAGNOSIS — N393 Stress incontinence (female) (male): Secondary | ICD-10-CM | POA: Insufficient documentation

## 2021-11-10 DIAGNOSIS — K5900 Constipation, unspecified: Secondary | ICD-10-CM | POA: Diagnosis not present

## 2021-11-10 DIAGNOSIS — R8290 Unspecified abnormal findings in urine: Secondary | ICD-10-CM | POA: Diagnosis not present

## 2021-11-10 DIAGNOSIS — N393 Stress incontinence (female) (male): Secondary | ICD-10-CM | POA: Diagnosis not present

## 2021-11-10 DIAGNOSIS — N8110 Cystocele, unspecified: Secondary | ICD-10-CM | POA: Diagnosis not present

## 2021-11-10 DIAGNOSIS — Z4689 Encounter for fitting and adjustment of other specified devices: Secondary | ICD-10-CM | POA: Diagnosis not present

## 2021-11-10 DIAGNOSIS — N952 Postmenopausal atrophic vaginitis: Secondary | ICD-10-CM | POA: Diagnosis not present

## 2021-11-24 DIAGNOSIS — Z961 Presence of intraocular lens: Secondary | ICD-10-CM | POA: Diagnosis not present

## 2021-11-24 DIAGNOSIS — L718 Other rosacea: Secondary | ICD-10-CM | POA: Diagnosis not present

## 2021-11-24 DIAGNOSIS — H18513 Endothelial corneal dystrophy, bilateral: Secondary | ICD-10-CM | POA: Diagnosis not present

## 2021-11-24 DIAGNOSIS — H02403 Unspecified ptosis of bilateral eyelids: Secondary | ICD-10-CM | POA: Diagnosis not present

## 2021-11-26 ENCOUNTER — Other Ambulatory Visit: Payer: Self-pay | Admitting: Internal Medicine

## 2021-12-01 ENCOUNTER — Encounter: Payer: Self-pay | Admitting: Internal Medicine

## 2021-12-01 NOTE — Patient Instructions (Addendum)
   Flu immunization administered today.     Blood work was ordered.     Medications changes include :   none  Your prescription(s) have been submitted to your pharmacy. Please take as directed and contact our office if you believe you are having problem(s) with the medication(s).   A referral was ordered for cardiology.       Someone from their office will call you to schedule an appointment.    Please followup in 6 months

## 2021-12-01 NOTE — Progress Notes (Signed)
Subjective:    Patient ID: Lisa Vega, female    DOB: 09/02/1932, 87 y.o.   MRN: [id number]  This visit occurred during the SARS-CoV-2 public health emergency.  Safety protocols were in place, including screening questions prior to the visit, additional usage of staff PPE, and extensive cleaning of exam room while observing appropriate contact time as indicated for disinfecting solutions.     HPI The patient is here for follow up of their chronic medical problems, including dm, hypothyroid, GERD, anxiety, depression  She checks her pulse ox at times and her HR goes down to the 30's and sometimes jumps up to the 60-70's.  The low HR is usually at night.  She is unsure if this causes any symptoms, but it does make her anxious.   This past weekend she had a bad left earache.  She has pain in the left side of her neck when she turns her head.  Medications and allergies reviewed with patient and updated if appropriate.  Patient Active Problem List   Diagnosis Date Noted   Urinary frequency 10/20/2021   Arthralgia of lower leg 02/24/2021   On long term drug therapy 02/24/2021   Myalgia 02/24/2021   Anxiety state 02/24/2021   Diverticular disease of colon 02/24/2021   Dysphagia 02/24/2021   Essential tremor 02/24/2021   History of colonic polyps 02/24/2021   Mixed hyperlipidemia 02/24/2021   Vitamin B12 deficiency 02/24/2021   Vitamin D deficiency 02/24/2021   Slow transit constipation 02/24/2021   COVID-19 01/28/2021   Headache, cervicogenic 01/03/2021   Rosacea 11/29/2020   Cervical spondylosis 04/18/2020   Primary osteoarthritis of first carpometacarpal joint of right hand 05/10/2019   Headache syndrome 03/09/2019   Carotid stenosis, left 09/19/2018   Osteoarthritis of left knee 05/12/2018   Benign positional vertigo 03/10/2018   History of lung cancer 11/29/2017   Difficulty sleeping 09/22/2017   Diabetes ([place]) 05/13/2017   Fatigue 05/12/2017   CAP (community  acquired pneumonia) 05/03/2017   Orthopnea 01/12/2017   GERD (gastroesophageal reflux disease) 11/09/2016   Osteoporosis 05/06/2016   Depression 05/06/2016   Hiatal hernia 05/06/2016   Hypothyroidism 05/06/2016   Tremor 10/28/2015   Memory difficulties 10/28/2015   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 ([place]) 05/30/2014   Diverticulitis of colon without hemorrhage 08/07/2013   Prolapse of female pelvic organs 06/22/2013   Neck pain 03/10/2013   Vaginal atrophy 03/09/2013   Rectocele 03/09/2013   Cystocele 03/09/2013   Urinary incontinence 03/09/2013   COPD (chronic obstructive pulmonary disease) ([place]) 01/16/2011   CARCINOMA, LUNG, NONSMALL CELL 12/17/2010   Rheumatoid arthritis ([place]) 12/17/2010   Dyspnea on exertion 12/17/2010    Current Outpatient Medications on File Prior to Visit  Medication Sig Dispense Refill   acetaminophen (TYLENOL) 325 MG tablet Take 650 mg by mouth every 6 (six) hours as needed for mild pain.      albuterol (PROAIR HFA) 108 (90 Base) MCG/ACT inhaler USE 1-2 PUFFS EVERY 4-6 HOURS AS NEEDED 8.5 g 5   aspirin EC 81 MG tablet Take 81 mg by mouth daily.     B-D TB SYRINGE 1CC/27GX1/2" 27G X 1/2" 1 ML MISC For use with methotrexate injection weekly 25 each 1   budesonide-formoterol (SYMBICORT) 160-4.5 MCG/ACT inhaler USE 2 INHALATIONS ORALLY   TWICE DAILY 30.6 g 2   clidinium-chlordiazePOXIDE (LIBRAX) 5-2.5 MG capsule SMARTSIG:1 Capsule(s) By Mouth Every 12 Hours PRN     famotidine (PEPCID) 40 MG tablet TAKE 1 TABLET  DAILY 90 tablet 3   folic acid (FOLVITE) 1 MG tablet Take 1 tablet (1 mg total) by mouth daily. 90 tablet 1   gabapentin (NEURONTIN) 100 MG capsule Take 1 capsule (100 mg total) by mouth 3 (three) times daily. 270 capsule 3   glucose blood (ONETOUCH ULTRA) test strip Use to check blood sugars twice a day 100 each 5   ibuprofen (ADVIL,MOTRIN) 200 MG tablet Take 200 mg by mouth every 6 (six) hours as needed for headache or mild pain.      meclizine (ANTIVERT) 25 MG tablet Take 0.5-1 tablets (12.5-25 mg total) by mouth 3 (three) times daily as needed for dizziness. 60 tablet 5   Methotrexate Sodium (METHOTREXATE, PF,) 50 MG/2ML injection INJECT 0.7 MLS (17.5 MG TOTAL) INTO THE SKIN ONCE A WEEK. 4 mL 2   mupirocin ointment (BACTROBAN) 2 % Place 1 application into the nose 2 (two) times daily. 22 g 1   omeprazole (PRILOSEC) 40 MG capsule Take 40 mg by mouth daily.     ONETOUCH DELICA LANCETS 62G MISC USE TO CHECK SUGARS UP TO 4 TIMES A DAY  0   Plecanatide (TRULANCE) 3 MG TABS Take 3 mg by mouth as needed. (Samples from GI)     sertraline (ZOLOFT) 100 MG tablet TAKE 1 TABLET BY MOUTH EVERY DAY 90 tablet 1   SPIRIVA RESPIMAT 2.5 MCG/ACT AERS USE 2 INHALATIONS ORALLY   DAILY (Patient taking differently: Inhale 2 puffs into the lungs daily as needed.) 12 g 1   SYNTHROID 88 MCG tablet TAKE 1 TABLET DAILY 90 tablet 1   triamcinolone ointment (KENALOG) 0.1 % Apply 1 application topically 2 (two) times daily as needed. 30 g 3   Vitamin D, Cholecalciferol, 10 MCG (400 UNIT) CHEW Chew by mouth daily. Unsure of current dose     estradiol (ESTRACE) 0.1 MG/GM vaginal cream Place vaginally.     No current facility-administered medications on file prior to visit.    Past Medical History:  Diagnosis Date   Anxiety    Arthritis    RA   BPV (benign positional vertigo) 03/10/2018   Cancer ([place])    lung ca   Carotid stenosis, left 09/19/2018   COPD (chronic obstructive pulmonary disease) ([facility])    Depression    Dyspnea    with exertion   GERD (gastroesophageal reflux disease)    Headache syndrome 03/09/2019   Hypercholesteremia    Hypothyroidism    Memory difficulties 10/28/2015   Pneumonia     x3  last time 2017   Pre-diabetes    Rheumatoid arthritis(714.0)    Thyroid disease    Tremor 10/28/2015   Jaw tremor   Uterine prolapse    Vitamin D deficiency     Past Surgical History:  Procedure Laterality Date   ANGIOPLASTY Left  10/10/2018   Procedure: ANGIOPLASTY using 1cm x 14cm Xenosure Patch;  Surgeon: Marty Heck, MD;  Location: [facility] OR;  Service: Vascular;  Laterality: Left;   APPENDECTOMY     COLONOSCOPY W/ POLYPECTOMY     ENDARTERECTOMY Left 10/10/2018   Procedure: ENDARTERECTOMY CAROTID;  Surgeon: Marty Heck, MD;  Location: [facility];  Service: Vascular;  Laterality: Left;   EYE SURGERY Bilateral    cataract   FOOT SURGERY Bilateral    arthritis   Hands  Bilateral    Arthritis   HEMORRHOID SURGERY     right lobectomy  10/2004   TONSILLECTOMY AND ADENOIDECTOMY      Social History  Socioeconomic History   Marital status: Widowed    Spouse name: Not on file   Number of children: 3   Years of education: LPN   Highest education level: Not on file  Occupational History   Occupation: retired    Fish farm manager: RETIRED    Comment: LPN at [employer] Use   Smoking status: Former    Packs/day: 1.00    Years: 20.00    Pack years: 20.00    Types: Cigarettes    Quit date: 11/10/2004    Years since quitting: 17.0   Smokeless tobacco: Never  Vaping Use   Vaping Use: Never used  Substance and Sexual Activity   Alcohol use: Yes    Alcohol/week: 1.0 standard drink    Types: 1 Glasses of wine per week    Comment: socially   Drug use: No   Sexual activity: Not Currently  Other Topics Concern   Not on file  Social History Narrative   09/09/21 lives with dgtr Lovey Newcomer; widowed   Patient drinks about 3 cups of coffee daily.   Patient is right handed.    Retired Corporate treasurer at [employer] Strain: Low Risk    Difficulty of Paying Living Expenses: Not hard at all  Food Insecurity: No Food Insecurity   Worried About Charity fundraiser in the Last Year: Never true   Arboriculturist in the Last Year: Never true  Transportation Needs: No Transportation Needs   Lack of Transportation (Medical): No   Lack of  Transportation (Non-Medical): No  Physical Activity: Inactive   Days of Exercise per Week: 0 days   Minutes of Exercise per Session: 0 min  Stress: No Stress Concern Present   Feeling of Stress : Not at all  Social Connections: Socially Isolated   Frequency of Communication with Friends and Family: More than three times a week   Frequency of Social Gatherings with Friends and Family: Once a week   Attends Religious Services: Never   Marine scientist or Organizations: No   Attends Archivist Meetings: Never   Marital Status: Widowed    Family History  Problem Relation Age of Onset   Emphysema Mother    Asthma Mother    Lung cancer Father    Pancreatic cancer Father    Bone cancer Father    Heart disease Brother        x1   Diabetes Brother    Hypertension Brother    Lupus Brother    Heart disease Sister        x2   Diabetes Sister    Hypertension Sister    Diabetes Sister    Hypertension Sister    Diabetes Brother    Hypertension Brother    Stroke Maternal Grandfather    Hypertension Son    Tremor Son     Review of Systems  Constitutional:  Negative for fever.  Respiratory:  Positive for shortness of breath. Negative for cough and wheezing.   Cardiovascular:  Positive for chest pain (sometimes).  Neurological:  Positive for dizziness, light-headedness and headaches.      Objective:   Vitals:   12/02/21 1110  BP: 104/72  Pulse: 63  Temp: 97.9 F (36.6 C)  SpO2: 95%   BP Readings from Last 3 Encounters:  12/02/21 104/72  10/20/21 139/81  10/08/21 118/78   Wt Readings from Last 3 Encounters:  12/02/21 131 lb (59.4 kg)  10/20/21 136 lb (61.7 kg)  09/12/21 135 lb (61.2 kg)   Body mass index is 23.21 kg/m.   Physical Exam    Constitutional: Appears well-developed and well-nourished. No distress.  HENT:  Head: Normocephalic and atraumatic.  Neck: Neck supple. No tracheal deviation present. No thyromegaly present.  No cervical  lymphadenopathy Cardiovascular: Normal rate, regular rhythm and normal heart sounds.   No murmur heard. No carotid bruit .  No edema Pulmonary/Chest: Effort normal and breath sounds normal. No respiratory distress. No has no wheezes. No rales.  Skin: Skin is warm and dry. Not diaphoretic.  Psychiatric: Normal mood and affect. Behavior is normal.      Assessment & Plan:    See Problem List for Assessment and Plan of chronic medical problems.

## 2021-12-02 ENCOUNTER — Ambulatory Visit (INDEPENDENT_AMBULATORY_CARE_PROVIDER_SITE_OTHER): Payer: Medicare Other | Admitting: Internal Medicine

## 2021-12-02 ENCOUNTER — Other Ambulatory Visit: Payer: Self-pay

## 2021-12-02 VITALS — BP 104/72 | HR 63 | Temp 97.9°F | Ht 63.0 in | Wt 131.0 lb

## 2021-12-02 DIAGNOSIS — R001 Bradycardia, unspecified: Secondary | ICD-10-CM | POA: Diagnosis not present

## 2021-12-02 DIAGNOSIS — K219 Gastro-esophageal reflux disease without esophagitis: Secondary | ICD-10-CM

## 2021-12-02 DIAGNOSIS — F3289 Other specified depressive episodes: Secondary | ICD-10-CM

## 2021-12-02 DIAGNOSIS — E119 Type 2 diabetes mellitus without complications: Secondary | ICD-10-CM | POA: Diagnosis not present

## 2021-12-02 DIAGNOSIS — E038 Other specified hypothyroidism: Secondary | ICD-10-CM

## 2021-12-02 DIAGNOSIS — F411 Generalized anxiety disorder: Secondary | ICD-10-CM

## 2021-12-02 DIAGNOSIS — I6523 Occlusion and stenosis of bilateral carotid arteries: Secondary | ICD-10-CM | POA: Diagnosis not present

## 2021-12-02 LAB — COMPREHENSIVE METABOLIC PANEL
ALT: 17 U/L (ref 0–35)
AST: 39 U/L — ABNORMAL HIGH (ref 0–37)
Albumin: 3.7 g/dL (ref 3.5–5.2)
Alkaline Phosphatase: 87 U/L (ref 39–117)
BUN: 12 mg/dL (ref 6–23)
CO2: 31 mEq/L (ref 19–32)
Calcium: 9.3 mg/dL (ref 8.4–10.5)
Chloride: 101 mEq/L (ref 96–112)
Creatinine, Ser: 0.96 mg/dL (ref 0.40–1.20)
GFR: 53.32 mL/min — ABNORMAL LOW (ref >60.00)
Glucose, Bld: 90 mg/dL (ref 70–99)
Potassium: 3.9 mEq/L (ref 3.5–5.1)
Sodium: 136 mEq/L (ref 135–145)
Total Bilirubin: 0.7 mg/dL (ref 0.2–1.2)
Total Protein: 7.2 g/dL (ref 6.0–8.3)

## 2021-12-02 LAB — HEMOGLOBIN A1C: Hgb A1c MFr Bld: 5.9 % (ref 4.6–6.5)

## 2021-12-02 LAB — TSH: TSH: 0.72 u[IU]/mL (ref 0.35–5.50)

## 2021-12-02 MED ORDER — ALPRAZOLAM 0.25 MG PO TABS: ORAL_TABLET | ORAL | 2 refills | Status: DC

## 2021-12-02 NOTE — Assessment & Plan Note (Signed)
Chronic  Clinically euthyroid Currently taking Synthroid 88 mcg daily Check tsh  Titrate med dose if needed

## 2021-12-02 NOTE — Assessment & Plan Note (Signed)
Chronic Diet controlled Check A1c

## 2021-12-02 NOTE — Assessment & Plan Note (Addendum)
Chronic Home environment is stressful Controlled, Stable Continue sertraline 100 mg daily, Xanax 0.25 mg daily as needed

## 2021-12-02 NOTE — Assessment & Plan Note (Addendum)
Chronic She does get depressed at times and her home situation has been stressful Controlled, Stable Continue sertraline 100 mg daily

## 2021-12-02 NOTE — Assessment & Plan Note (Addendum)
Chronic GERD controlled Continue famotidine 40 mg daily, omeprazole 40 mg daily

## 2021-12-04 ENCOUNTER — Ambulatory Visit: Payer: Medicare Other | Admitting: Nurse Practitioner

## 2021-12-23 DIAGNOSIS — Z4689 Encounter for fitting and adjustment of other specified devices: Secondary | ICD-10-CM | POA: Diagnosis not present

## 2021-12-23 DIAGNOSIS — R300 Dysuria: Secondary | ICD-10-CM | POA: Diagnosis not present

## 2021-12-29 NOTE — Progress Notes (Signed)
Cardiology Office Note:    Date:  01/01/2022   ID:  Lisa Vega, DOB Aug 08, 1932, MRN [id number]  PCP:  Binnie Rail, MD  Cardiologist:  Kathlyn Sacramento, MD  Electrophysiologist:  None   Referring MD: Binnie Rail, MD   Chief Complaint  Patient presents with   Bradycardia    History of Present Illness:    Lisa Vega is a 88 y.o. female with a hx of lung cancer, carotid stenosis, COPD, GERD, hyperlipidemia, hypothyroidism, rheumatoid arthritis who is referred by Dr. Quay Burow for evaluation of bradycardia.  She reports has noted heart rate in the 30s when checks pulse ox at home.  Does feel weak when heart rate is low.  She denies any lightheadedness or syncope.  Reports has been having tightness in her lower chest.  Circles around to her back.  No clear elation of with exertion, but does report she gets short of breath with minimal exertion.  She denies any lower extremity edema or palpitations.  She quit smoking 2005.  Family history includes brother died of MI in 66s, sister died of MI in 48s, father brother had multiple stents placed in his 46s   Past Medical History:  Diagnosis Date   Anxiety    Arthritis    RA   BPV (benign positional vertigo) 03/10/2018   Cancer ([place])    lung ca   Carotid stenosis, left 09/19/2018   COPD (chronic obstructive pulmonary disease) ([place])    Depression    Dyspnea    with exertion   GERD (gastroesophageal reflux disease)    Headache syndrome 03/09/2019   Hypercholesteremia    Hypothyroidism    Memory difficulties 10/28/2015   Pneumonia     x3  last time 2017   Pre-diabetes    Rheumatoid arthritis(714.0)    Thyroid disease    Tremor 10/28/2015   Jaw tremor   Uterine prolapse    Vitamin D deficiency     Past Surgical History:  Procedure Laterality Date   ANGIOPLASTY Left 10/10/2018   Procedure: ANGIOPLASTY using 1cm x 14cm Xenosure Patch;  Surgeon: Marty Heck, MD;  Location: [facility] OR;  Service: Vascular;  Laterality:  Left;   APPENDECTOMY     COLONOSCOPY W/ POLYPECTOMY     ENDARTERECTOMY Left 10/10/2018   Procedure: ENDARTERECTOMY CAROTID;  Surgeon: Marty Heck, MD;  Location: [facility] OR;  Service: Vascular;  Laterality: Left;   EYE SURGERY Bilateral    cataract   FOOT SURGERY Bilateral    arthritis   Hands  Bilateral    Arthritis   HEMORRHOID SURGERY     right lobectomy  10/2004   TONSILLECTOMY AND ADENOIDECTOMY      Current Medications: Current Meds  Medication Sig   albuterol (PROAIR HFA) 108 (90 Base) MCG/ACT inhaler USE 1-2 PUFFS EVERY 4-6 HOURS AS NEEDED   aspirin EC 81 MG tablet Take 81 mg by mouth daily.   atorvastatin (LIPITOR) 20 MG tablet Take 1 tablet (20 mg total) by mouth daily.   B-D TB SYRINGE 1CC/27GX1/2" 27G X 1/2" 1 ML MISC For use with methotrexate injection weekly   budesonide-formoterol (SYMBICORT) 160-4.5 MCG/ACT inhaler USE 2 INHALATIONS ORALLY   TWICE DAILY   clidinium-chlordiazePOXIDE (LIBRAX) 5-2.5 MG capsule SMARTSIG:1 Capsule(s) By Mouth Every 12 Hours PRN   estradiol (ESTRACE) 0.1 MG/GM vaginal cream Place vaginally.   famotidine (PEPCID) 40 MG tablet TAKE 1 TABLET DAILY   folic acid (FOLVITE) 1 MG tablet Take 1 tablet (1 mg  total) by mouth daily.   gabapentin (NEURONTIN) 100 MG capsule Take 1 capsule (100 mg total) by mouth 3 (three) times daily.   glucose blood (ONETOUCH ULTRA) test strip Use to check blood sugars twice a day   ibuprofen (ADVIL,MOTRIN) 200 MG tablet Take 200 mg by mouth every 6 (six) hours as needed for headache or mild pain.   Methotrexate Sodium (METHOTREXATE, PF,) 50 MG/2ML injection INJECT 0.7 MLS (17.5 MG TOTAL) INTO THE SKIN ONCE A WEEK.   mupirocin ointment (BACTROBAN) 2 % Place 1 application into the nose 2 (two) times daily.   omeprazole (PRILOSEC) 40 MG capsule Take 40 mg by mouth daily.   ONETOUCH DELICA LANCETS 67E MISC USE TO CHECK SUGARS UP TO 4 TIMES A DAY   Plecanatide (TRULANCE) 3 MG TABS Take 3 mg by mouth as needed. (Samples  from GI)   sertraline (ZOLOFT) 100 MG tablet TAKE 1 TABLET BY MOUTH EVERY DAY   SPIRIVA RESPIMAT 2.5 MCG/ACT AERS USE 2 INHALATIONS ORALLY   DAILY (Patient taking differently: Inhale 2 puffs into the lungs daily as needed.)   SYNTHROID 88 MCG tablet TAKE 1 TABLET DAILY   triamcinolone ointment (KENALOG) 0.1 % Apply 1 application topically 2 (two) times daily as needed.   Vitamin D, Cholecalciferol, 10 MCG (400 UNIT) CHEW Chew by mouth daily. Unsure of current dose     Allergies:   Codeine, Demerol, Meperidine hcl, Meperidine hcl, Other, and Sulfa antibiotics   Social History   Socioeconomic History   Marital status: Widowed    Spouse name: Not on file   Number of children: 3   Years of education: LPN   Highest education level: Not on file  Occupational History   Occupation: retired    Fish farm manager: RETIRED    Comment: LPN at [employer] Use   Smoking status: Former    Packs/day: 1.00    Years: 20.00    Pack years: 20.00    Types: Cigarettes    Quit date: 11/10/2004    Years since quitting: 17.1   Smokeless tobacco: Never  Vaping Use   Vaping Use: Never used  Substance and Sexual Activity   Alcohol use: Yes    Alcohol/week: 1.0 standard drink    Types: 1 Glasses of wine per week    Comment: socially   Drug use: No   Sexual activity: Not Currently  Other Topics Concern   Not on file  Social History Narrative   09/09/21 lives with dgtr Lovey Newcomer; widowed   Patient drinks about 3 cups of coffee daily.   Patient is right handed.    Retired Corporate treasurer at [employer] Strain: Low Risk    Difficulty of Paying Living Expenses: Not hard at all  Food Insecurity: No Food Insecurity   Worried About Charity fundraiser in the Last Year: Never true   Arboriculturist in the Last Year: Never true  Transportation Needs: No Transportation Needs   Lack of Transportation (Medical): No   Lack of Transportation  (Non-Medical): No  Physical Activity: Inactive   Days of Exercise per Week: 0 days   Minutes of Exercise per Session: 0 min  Stress: No Stress Concern Present   Feeling of Stress : Not at all  Social Connections: Socially Isolated   Frequency of Communication with Friends and Family: More than three times a week   Frequency of Social Gatherings with  Friends and Family: Once a week   Attends Religious Services: Never   Marine scientist or Organizations: No   Attends Archivist Meetings: Never   Marital Status: Widowed     Family History: The patient's family history includes Asthma in her mother; Bone cancer in her father; Diabetes in her brother, brother, sister, and sister; Emphysema in her mother; Heart disease in her brother and sister; Hypertension in her brother, brother, sister, sister, and son; Lung cancer in her father; Lupus in her brother; Pancreatic cancer in her father; Stroke in her maternal grandfather; Tremor in her son.  ROS:   Please see the history of present illness.     All other systems reviewed and are negative.  EKGs/Labs/Other Studies Reviewed:    The following studies were reviewed today:   EKG:   01/01/22: Normal sinus rhythm, sinus arrhythmia, rate 70, left bundle branch block  Recent Labs: 10/20/2021: Hemoglobin 12.9; Platelets 188 12/02/2021: ALT 17; BUN 12; Creatinine, Ser 0.96; Potassium 3.9; Sodium 136; TSH 0.72  Recent Lipid Panel    Component Value Date/Time   CHOL 195 06/02/2021 1412   CHOL 245 (H) 04/21/2018 0821   TRIG 124.0 06/02/2021 1412   HDL 58.30 06/02/2021 1412   HDL 63 04/21/2018 0821   CHOLHDL 3 06/02/2021 1412   VLDL 24.8 06/02/2021 1412   LDLCALC 112 (H) 06/02/2021 1412   LDLCALC 148 (H) 04/21/2018 0821    Physical Exam:    VS:  BP 120/70 (BP Location: Left Arm, Patient Position: Sitting, Cuff Size: Normal)    Pulse 70    Ht 5\' 3"  (1.6 m)    Wt 132 lb 3.2 oz (60 kg)    SpO2 93%    BMI 23.42 kg/m     Wt  Readings from Last 3 Encounters:  01/01/22 132 lb 3.2 oz (60 kg)  12/02/21 131 lb (59.4 kg)  10/20/21 136 lb (61.7 kg)     GEN:  Well nourished, well developed in no acute distress HEENT: Normal NECK: No JVD; No carotid bruits LYMPHATICS: No lymphadenopathy CARDIAC: RRR, no murmurs, rubs, gallops RESPIRATORY:  Clear to auscultation without rales, wheezing or rhonchi  ABDOMEN: Soft, non-tender, non-distended MUSCULOSKELETAL:  No edema; No deformity  SKIN: Warm and dry NEUROLOGIC:  Alert and oriented x 3 PSYCHIATRIC:  Normal affect   ASSESSMENT:    1. Precordial pain   2. Bradycardia   3. LBBB (left bundle branch block)   4. Carotid stenosis, bilateral    PLAN:     Bradycardia: Reports heart rate down to 30s at home and episodes of weakness.  Will check Zio patch x2 weeks to evaluate for bradyarrhythmias  Chest pain: Atypical in description but does have significant CAD risk factors and has known carotid stenosis.  Will evaluate for ischemia with Lexiscan Myoview  Left bundle branch block: Check echocardiogram to evaluate for structural heart disease  Carotid stenosis: Status post left CEA 09/2018.  Carotid duplex 03/2021 showed 40 to 59% right stenosis and 1 to 39% left stenosis -Follows with vascular surgery -Continue aspirin 81 mg daily -LDL 112 on 06/02/2021, will start atorvastatin 20 mg daily  RTC in 6 months  Shared Decision Making/Informed Consent The risks [chest pain, shortness of breath, cardiac arrhythmias, dizziness, blood pressure fluctuations, myocardial infarction, stroke/transient ischemic attack, nausea, vomiting, allergic reaction, radiation exposure, metallic taste sensation and life-threatening complications (estimated to be 1 in 10,000)], benefits (risk stratification, diagnosing coronary artery disease, treatment guidance) and alternatives of  a nuclear stress test were discussed in detail with Lisa Vega and she agrees to proceed.    Medication  Adjustments/Labs and Tests Ordered: Current medicines are reviewed at length with the patient today.  Concerns regarding medicines are outlined above.  Orders Placed This Encounter  Procedures   LONG TERM MONITOR (3-14 DAYS)   MYOCARDIAL PERFUSION IMAGING   EKG 12-Lead   ECHOCARDIOGRAM COMPLETE   Meds ordered this encounter  Medications   atorvastatin (LIPITOR) 20 MG tablet    Sig: Take 1 tablet (20 mg total) by mouth daily.    Dispense:  90 tablet    Refill:  3    Patient Instructions  Medication Instructions:   START ATORVASTATIN 20 MG ONCE DAILY  *If you need a refill on your cardiac medications before your next appointment, please call your pharmacy*   Testing/Procedures:  Your physician has requested that you have a lexiscan myoview. For further information please visit [email or web address]. Please follow instruction sheet, as given. [place] has requested that you have an echocardiogram. Echocardiography is a painless test that uses sound waves to create images of your heart. It provides your doctor with information about the size and shape of your heart and how well your hearts chambers and valves are working. This procedure takes approximately one hour. There are no restrictions for this procedure. [place] Instructions  Your physician has requested you wear a ZIO patch monitor for 14 days.  This is a single patch monitor. [employer] supplies one patch monitor per enrollment. Additional stickers are not available. Please do not apply patch if you will be having a Nuclear Stress Test,  Echocardiogram, Cardiac CT, MRI, or Chest Xray during the period you would be wearing the  monitor. The patch cannot be worn during these tests. You cannot remove and re-apply the  ZIO XT patch monitor.  Your ZIO patch monitor will be mailed 3 day USPS to your address on file. It may take 3-5 days  to receive your  monitor after you have been enrolled.  Once you have received your monitor, please review the enclosed instructions. Your monitor  has already been registered assigning a specific monitor serial # to you.  Billing and Patient Assistance Program Information  We have supplied [employer] with any of your insurance information on file for billing purposes. [employer] offers a sliding scale Patient Assistance Program for patients that do not have  insurance, or whose insurance does not completely cover the cost of the ZIO monitor.  You must apply for the Patient Assistance Program to qualify for this discounted rate.  To apply, please call [employer] at [phone], select option 4, select option 2, ask to apply for  Patient Assistance Program. Theodore Demark will ask your household income, and how many people  are in your household. They will quote your out-of-pocket cost based on that information.  [employer] will also be able to set up a 75-month, interest-free payment plan if needed.  Applying the monitor   Shave hair from upper left chest.  Hold abrader disc by orange tab. Rub abrader in 40 strokes over the upper left chest as  indicated in your monitor instructions.  Clean area with 4 enclosed alcohol pads. Let dry.  Apply patch as indicated in monitor instructions. Patch will be placed under collarbone on left  side of chest with arrow pointing upward.  Rub patch adhesive wings for 2 minutes. Remove white  label marked "1". Remove the white  label marked "2". Rub patch adhesive wings for 2 additional minutes.  While looking in a mirror, press and release button in center of patch. A small green light will  flash 3-4 times. This will be your only indicator that the monitor has been turned on.  Do not shower for the first 24 hours. You may shower after the first 24 hours.  Press the button if you feel a symptom. You will hear a small click. Record Date, Time and  Symptom in the Patient Logbook.  When you  are ready to remove the patch, follow instructions on the last 2 pages of Patient  Logbook. Stick patch monitor onto the last page of Patient Logbook.  Place Patient Logbook in the blue and white box. Use locking tab on box and tape box closed  securely. The blue and white box has prepaid postage on it. Please place it in the mailbox as  soon as possible. Your physician should have your test results approximately 7 days after the  monitor has been mailed back to [facility].  Call [facility] at [phone] if you have questions regarding  your ZIO XT patch monitor. Call them immediately if you see an orange light blinking on your  monitor.  If your monitor falls off in less than 4 days, contact our Monitor department at [phone].  If your monitor becomes loose or falls off after 4 days call [employer] at [phone] for  suggestions on securing your monitor    Follow-Up: At [facility], you and your health needs are our priority.  As part of our continuing mission to provide you with exceptional heart care, we have created designated Provider Care Teams.  These Care Teams include your primary Cardiologist (physician) and Advanced Practice Providers (APPs -  Physician Assistants and Nurse Practitioners) who all work together to provide you with the care you need, when you need it.  We recommend signing up for the patient portal called "MyChart".  Sign up information is provided on this After Visit Summary.  MyChart is used to connect with patients for Virtual Visits (Telemedicine).  Patients are able to view lab/test results, encounter notes, upcoming appointments, etc.  Non-urgent messages can be sent to your provider as well.   To learn more about what you can do with MyChart, go to [email or web address].    Your next appointment:   6 month(s)  The format for your next appointment:   In Person  Provider:   Oswaldo Milian MD      Signed, Donato Heinz, MD  01/01/2022 3:31 PM    [place]

## 2022-01-01 ENCOUNTER — Other Ambulatory Visit: Payer: Self-pay

## 2022-01-01 ENCOUNTER — Ambulatory Visit (INDEPENDENT_AMBULATORY_CARE_PROVIDER_SITE_OTHER): Payer: Medicare Other | Admitting: Cardiology

## 2022-01-01 ENCOUNTER — Encounter: Payer: Self-pay | Admitting: Cardiology

## 2022-01-01 ENCOUNTER — Ambulatory Visit (INDEPENDENT_AMBULATORY_CARE_PROVIDER_SITE_OTHER): Payer: Medicare Other

## 2022-01-01 ENCOUNTER — Encounter (HOSPITAL_COMMUNITY): Payer: Self-pay | Admitting: Cardiology

## 2022-01-01 VITALS — BP 120/70 | HR 70 | Ht 63.0 in | Wt 132.2 lb

## 2022-01-01 DIAGNOSIS — R001 Bradycardia, unspecified: Secondary | ICD-10-CM | POA: Diagnosis not present

## 2022-01-01 DIAGNOSIS — R072 Precordial pain: Secondary | ICD-10-CM | POA: Diagnosis not present

## 2022-01-01 DIAGNOSIS — I6523 Occlusion and stenosis of bilateral carotid arteries: Secondary | ICD-10-CM | POA: Diagnosis not present

## 2022-01-01 DIAGNOSIS — I447 Left bundle-branch block, unspecified: Secondary | ICD-10-CM | POA: Diagnosis not present

## 2022-01-01 MED ORDER — ATORVASTATIN CALCIUM 20 MG PO TABS: 20.0000 mg | ORAL_TABLET | Freq: Every day | ORAL | 3 refills | Status: DC

## 2022-01-01 NOTE — Patient Instructions (Signed)
Medication Instructions:   START ATORVASTATIN 20 MG ONCE DAILY  *If you need a refill on your cardiac medications before your next appointment, please call your pharmacy*   Testing/Procedures:  Your physician has requested that you have a lexiscan myoview. For further information please visit [email or web address]. Please follow instruction sheet, as given. [place] has requested that you have an echocardiogram. Echocardiography is a painless test that uses sound waves to create images of your heart. It provides your doctor with information about the size and shape of your heart and how well your hearts chambers and valves are working. This procedure takes approximately one hour. There are no restrictions for this procedure. [place] Instructions  Your physician has requested you wear a ZIO patch monitor for 14 days.  This is a single patch monitor. [employer] supplies one patch monitor per enrollment. Additional stickers are not available. Please do not apply patch if you will be having a Nuclear Stress Test,  Echocardiogram, Cardiac CT, MRI, or Chest Xray during the period you would be wearing the  monitor. The patch cannot be worn during these tests. You cannot remove and re-apply the  ZIO XT patch monitor.  Your ZIO patch monitor will be mailed 3 day USPS to your address on file. It may take 3-5 days  to receive your monitor after you have been enrolled.  Once you have received your monitor, please review the enclosed instructions. Your monitor  has already been registered assigning a specific monitor serial # to you.  Billing and Patient Assistance Program Information  We have supplied [employer] with any of your insurance information on file for billing purposes. [employer] offers a sliding scale Patient Assistance Program for patients that do not have  insurance, or whose insurance does not completely cover the cost  of the ZIO monitor.  You must apply for the Patient Assistance Program to qualify for this discounted rate.  To apply, please call [employer] at [phone], select option 4, select option 2, ask to apply for  Patient Assistance Program. Theodore Demark will ask your household income, and how many people  are in your household. They will quote your out-of-pocket cost based on that information.  [employer] will also be able to set up a 58-month, interest-free payment plan if needed.  Applying the monitor   Shave hair from upper left chest.  Hold abrader disc by orange tab. Rub abrader in 40 strokes over the upper left chest as  indicated in your monitor instructions.  Clean area with 4 enclosed alcohol pads. Let dry.  Apply patch as indicated in monitor instructions. Patch will be placed under collarbone on left  side of chest with arrow pointing upward.  Rub patch adhesive wings for 2 minutes. Remove white label marked "1". Remove the white  label marked "2". Rub patch adhesive wings for 2 additional minutes.  While looking in a mirror, press and release button in center of patch. A small green light will  flash 3-4 times. This will be your only indicator that the monitor has been turned on.  Do not shower for the first 24 hours. You may shower after the first 24 hours.  Press the button if you feel a symptom. You will hear a small click. Record Date, Time and  Symptom in the Patient Logbook.  When you are ready to remove the patch, follow instructions on the last 2 pages of Patient  Logbook.  Stick patch monitor onto the last page of Patient Logbook.  Place Patient Logbook in the blue and white box. Use locking tab on box and tape box closed  securely. The blue and white box has prepaid postage on it. Please place it in the mailbox as  soon as possible. Your physician should have your test results approximately 7 days after the  monitor has been mailed back to [facility].  Call [place] at [phone] if you have questions regarding  your ZIO XT patch monitor. Call them immediately if you see an orange light blinking on your  monitor.  If your monitor falls off in less than 4 days, contact our Monitor department at [phone].  If your monitor becomes loose or falls off after 4 days call [employer] at [phone] for  suggestions on securing your monitor    Follow-Up: At [facility], you and your health needs are our priority.  As part of our continuing mission to provide you with exceptional heart care, we have created designated Provider Care Teams.  These Care Teams include your primary Cardiologist (physician) and Advanced Practice Providers (APPs -  Physician Assistants and Nurse Practitioners) who all work together to provide you with the care you need, when you need it.  We recommend signing up for the patient portal called "MyChart".  Sign up information is provided on this After Visit Summary.  MyChart is used to connect with patients for Virtual Visits (Telemedicine).  Patients are able to view lab/test results, encounter notes, upcoming appointments, etc.  Non-urgent messages can be sent to your provider as well.   To learn more about what you can do with MyChart, go to [email or web address].    Your next appointment:   6 month(s)  The format for your next appointment:   In Person  Provider:   Oswaldo Milian MD

## 2022-01-01 NOTE — Progress Notes (Unsigned)
Enrolled patient for a 14 day Zio XT  monitor to be mailed to patients home  °

## 2022-01-02 ENCOUNTER — Ambulatory Visit: Payer: Medicare Other | Admitting: Podiatry

## 2022-01-03 ENCOUNTER — Other Ambulatory Visit: Payer: Self-pay | Admitting: Internal Medicine

## 2022-01-06 DIAGNOSIS — R001 Bradycardia, unspecified: Secondary | ICD-10-CM | POA: Diagnosis not present

## 2022-01-12 DIAGNOSIS — M25552 Pain in left hip: Secondary | ICD-10-CM | POA: Insufficient documentation

## 2022-01-12 DIAGNOSIS — M25562 Pain in left knee: Secondary | ICD-10-CM | POA: Diagnosis not present

## 2022-01-12 DIAGNOSIS — M5030 Other cervical disc degeneration, unspecified cervical region: Secondary | ICD-10-CM | POA: Diagnosis not present

## 2022-01-13 ENCOUNTER — Ambulatory Visit: Payer: Medicare Other | Admitting: Podiatry

## 2022-01-19 NOTE — Progress Notes (Signed)
-  Office Visit Note  Patient: Lisa Vega             Date of Birth: 01-10-1932           MRN: [id number]             PCP: Binnie Rail, MD Referring: Binnie Rail, MD Visit Date: 01/20/2022   Subjective:   History of Present Illness: Lisa Vega is a 88 y.o. female here for follow up for seropositive RA on MTX 17.5 mg [place] weekly and folic acid 1 mg daily.  She had recent bilateral knee steroid injections with D.r Nelva Bush which are very helpful. Her left shoulder continues to be moderately painful she takes some ibuprofen when it is very bad. She is currently wearing a heart monitor for arrhythmia. Her blood pressure is lower than normal she does feel slightly lightheaded but not particularly orthostatic symptoms no new swelling or dyspnea.  Previous HPI 10/20/21 Lisa Vega is a 88 y.o. female here for follow up for erosive RA on methotrexate 17.5 mg [place] weekly. Since our last visit she has significant ongoing joint pain but no severe or unbearable flare up. She has been seeing Dr. Nelva Bush for many years with good improvement to intraarticular injections and feels like it is about time, her left knee pain is acting up. She had at least one episode of right wrist swelling and erythema about a month ago. She has some urinary frequency problems evaluated recently for UTI this appeared to be negative, old pessary was recently removed and she is gong to see urology/gynecology new office tomorrow.    Previous HPI 12/06/20 History of Present Illness: Lisa Vega is a 88 y.o. female with a history of non-small cell lung cancer status post surgical resection and chemotherapy, hypothyroidism, osteoporosis, and COPD here to establish care for rheumatoid arthritis currently treated with methotrexate subcutaneous 17.5 mg weekly.  She was originally diagnosed around 40 years ago in [place] she to treatment with gold injections that were not effective.  She later treated with oral  methotrexate that was poorly tolerated with rashes at even low-dose.  She was switched to subcutaneous methotrexate which she has been taking for at least 20 years.  Since moving to this area she is seeing rheumatology care with Dr. Justine Null, Dr. Rockwell [place], Dr. Trudie Reed, and now seeks to transfer care for an office that is closer to her daughter's home where she has started living due to increased difficulties with independent living and commuting.   She has extensive long-term damage from her rheumatoid arthritis with subluxation deviation and deformity of the digits on both hands and both feet.  She is also had some involvement of the shoulders and wrists but not as severe.  She has degenerative back disease, cervical disc disease, and bilateral osteoarthritis of the knees.  The trouble with her knees especially with ulcer and deformity of the right foot significantly limit her mobility at this time.  She has not noticed a lot of joint swelling redness or heat recently and has not required recent steroid treatments.  She has had some benefit with intra-articular injections for the knees also had some injections for the neck and upper back with mixed results.  She also describes some nodules in the skin of her upper arms and on her hands that she thinks are more noticeable over the past several months.   She has a history of non-small cell lung cancer that was found with  a rapidly enlarging left lung nodule.  She was a former cigarette smoker with COPD but no longer.  She denies a known history of interstitial lung disease but has discussed difficulty say whether methotrexate has contributed a lung toxicity versus just her RA.   Review of Systems  Respiratory:  Negative for shortness of breath.   Musculoskeletal:  Positive for joint pain and joint pain.  Skin:  Negative for rash.  Neurological:  Positive for light-headedness. Negative for dizziness, fainting and headaches.   PMFS History:  Patient Active  Problem List   Diagnosis Date Noted   Pain in left shoulder 01/20/2022   Urinary frequency 10/20/2021   Arthralgia of lower leg 02/24/2021   On long term drug therapy 02/24/2021   Myalgia 02/24/2021   Anxiety state 02/24/2021   Diverticular disease of colon 02/24/2021   Dysphagia 02/24/2021   Essential tremor 02/24/2021   History of colonic polyps 02/24/2021   Mixed hyperlipidemia 02/24/2021   Vitamin B12 deficiency 02/24/2021   Vitamin D deficiency 02/24/2021   Slow transit constipation 02/24/2021   COVID-19 01/28/2021   Headache, cervicogenic 01/03/2021   High risk medication use 12/06/2020   Rosacea 11/29/2020   Cervical spondylosis 04/18/2020   Primary osteoarthritis of first carpometacarpal joint of right hand 05/10/2019   Headache syndrome 03/09/2019   Carotid stenosis, left 09/19/2018   Osteoarthritis of left knee 05/12/2018   Benign positional vertigo 03/10/2018   History of lung cancer 11/29/2017   Difficulty sleeping 09/22/2017   Diabetes ([place]) 05/13/2017   Fatigue 05/12/2017   CAP (community acquired pneumonia) 05/03/2017   Orthopnea 01/12/2017   GERD (gastroesophageal reflux disease) 11/09/2016   Osteoporosis 05/06/2016   Depression 05/06/2016   Hiatal hernia 05/06/2016   Hypothyroidism 05/06/2016   Tremor 10/28/2015   Memory difficulties 10/28/2015   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 ([place]) 05/30/2014   Diverticulitis of colon without hemorrhage 08/07/2013   Prolapse of female pelvic organs 06/22/2013   Neck pain 03/10/2013   Vaginal atrophy 03/09/2013   Rectocele 03/09/2013   Cystocele 03/09/2013   Urinary incontinence 03/09/2013   COPD (chronic obstructive pulmonary disease) ([place]) 01/16/2011   CARCINOMA, LUNG, NONSMALL CELL 12/17/2010   Rheumatoid arthritis ([place]) 12/17/2010   Dyspnea on exertion 12/17/2010    Past Medical History:  Diagnosis Date   Anxiety    Arthritis    RA   BPV (benign positional vertigo) 03/10/2018   Cancer  ([place])    lung ca   Carotid stenosis, left 09/19/2018   COPD (chronic obstructive pulmonary disease) ([facility])    Depression    Dyspnea    with exertion   GERD (gastroesophageal reflux disease)    Headache syndrome 03/09/2019   Hypercholesteremia    Hypothyroidism    Memory difficulties 10/28/2015   Pneumonia     x3  last time 2017   Pre-diabetes    Rheumatoid arthritis(714.0)    Thyroid disease    Tremor 10/28/2015   Jaw tremor   Uterine prolapse    Vitamin D deficiency     Family History  Problem Relation Age of Onset   Emphysema Mother    Asthma Mother    Lung cancer Father    Pancreatic cancer Father    Bone cancer Father    Heart disease Brother        x1   Diabetes Brother    Hypertension Brother    Lupus Brother    Heart disease Sister        x2  Diabetes Sister    Hypertension Sister    Diabetes Sister    Hypertension Sister    Diabetes Brother    Hypertension Brother    Stroke Maternal Grandfather    Hypertension Son    Tremor Son    Past Surgical History:  Procedure Laterality Date   ANGIOPLASTY Left 10/10/2018   Procedure: ANGIOPLASTY using 1cm x 14cm Xenosure Patch;  Surgeon: Marty Heck, MD;  Location: [facility] OR;  Service: Vascular;  Laterality: Left;   APPENDECTOMY     COLONOSCOPY W/ POLYPECTOMY     ENDARTERECTOMY Left 10/10/2018   Procedure: ENDARTERECTOMY CAROTID;  Surgeon: Marty Heck, MD;  Location: [facility] OR;  Service: Vascular;  Laterality: Left;   EYE SURGERY Bilateral    cataract   FOOT SURGERY Bilateral    arthritis   Hands  Bilateral    Arthritis   HEMORRHOID SURGERY     right lobectomy  10/2004   TONSILLECTOMY AND ADENOIDECTOMY     Social History   Social History Narrative   09/09/21 lives with dgtr Lovey Newcomer; widowed   Patient drinks about 3 cups of coffee daily.   Patient is right handed.    Retired Corporate treasurer at [employer] History  Administered Date(s) Administered   Fluad Quad(high Dose 65+)  12/08/2019, 10/31/2020   Influenza Split 10/18/2009, 09/28/2011, 10/28/2011, 09/28/2015, 10/09/2015   Influenza Whole 09/27/2012   Influenza, High Dose Seasonal PF 10/28/2016, 01/12/2017, 09/22/2017, 10/03/2018   Influenza,inj,Quad PF,6+ Mos 09/20/2013, 09/12/2014   Pneumococcal Conjugate-13 10/24/2015, 10/31/2020   Pneumococcal Polysaccharide-23 12/29/2007, 07/10/2008     Objective: Vital Signs: BP 111/73    Pulse 82    Resp 12    Ht 5\' 3"  (1.6 m)    Wt 131 lb 6.4 oz (59.6 kg)    BMI 23.28 kg/m    Physical Exam Cardiovascular:     Rate and Rhythm: Normal rate and regular rhythm.     Comments: Heart monitor on chest Pulmonary:     Effort: Pulmonary effort is normal.     Breath sounds: Normal breath sounds.  Skin:    General: Skin is warm and dry.     Findings: No rash.  Neurological:     General: No focal deficit present.     Mental Status: She is alert.  Psychiatric:        Mood and Affect: Mood normal.     Musculoskeletal Exam:  Neck full ROM no tenderness Left shoulder pain with internal rotation while elevated and with overhead abduction, passive and active ROM restricted Elbows full ROM no tenderness or swelling Wrists full ROM no tenderness or swelling MCP joints all with subluxation and lateral deviation nonreversible, swan neck deformities of both hands Knees full ROM no tenderness or swelling patellofemoral crepitus present b/l Ankles full ROM no tenderness or swelling   CDAI Exam: CDAI Score: 5  Patient Global: 30 mm; Provider Global: 10 mm Swollen: 0 ; Tender: 1  Joint Exam 01/20/2022      Right  Left  Glenohumeral      Tender     Investigation: No additional findings.  Imaging: No results found.  Recent Labs: Lab Results  Component Value Date   WBC 7.3 10/20/2021   HGB 12.9 10/20/2021   PLT 188 10/20/2021   NA 136 12/02/2021   K 3.9 12/02/2021   CL 101 12/02/2021   CO2 31 12/02/2021   GLUCOSE 90 12/02/2021   BUN 12 12/02/2021  CREATININE 0.96 12/02/2021   BILITOT 0.7 12/02/2021   ALKPHOS 87 12/02/2021   AST 39 (H) 12/02/2021   ALT 17 12/02/2021   PROT 7.2 12/02/2021   ALBUMIN 3.7 12/02/2021   CALCIUM 9.3 12/02/2021   GFRAA 63 06/25/2021    Speciality Comments: No specialty comments available.  Procedures:  No procedures performed Allergies: Codeine, Demerol, Meperidine hcl, Meperidine hcl, Other, and Sulfa antibiotics   Assessment / Plan:     Visit Diagnoses: Rheumatoid arthritis involving multiple sites, unspecified whether rheumatoid factor present ([employer]) - Plan: Sedimentation rate  She had questions today about possible alternative treatment options to methotrexate but I think her disease activity is being well controlled with minimal side effects so recommended against switching to anything more aggressive at this time. Discussed risk of malignancy and infections can be higher on biologic DMARD treatments and more of her current pain seems related to osteoarthritis or the chronic joint damage.  Rechecking sedimentation rate for monitoring.  Plan to continue methotrexate 17.5 mg subcu weekly and folic acid 1 mg daily.  High risk medication use - Plan: CBC with Differential/Platelet, COMPLETE METABOLIC PANEL WITH GFR  Checking CBC and CMP today for methotrexate toxicity monitoring.  Chronic left shoulder pain  Left shoulder pain and restricted range of movement similar as before.  She is not interested in any kind of aggressive treatment for this.  Discussed options such as physical therapy or injection treatments she prefers to just deal with it for now.  I provided some range of motion exercises for her to do at home.  Primary osteoarthritis of left knee  Knee pain is doing better today on both sides after bilateral steroid injections by Dr. Herma Mering about a week ago.   Orders: Orders Placed This Encounter  Procedures   Sedimentation rate   CBC with Differential/Platelet   COMPLETE METABOLIC PANEL  WITH GFR   No orders of the defined types were placed in this encounter.    Follow-Up Instructions: Return in about 3 months (around 04/20/2022) for RA on MTX f/u 52mos.   Collier Salina, MD  Note - This record has been created using [employer].  Chart creation errors have been sought, but may not always  have been located. Such creation errors do not reflect on  the standard of medical care.

## 2022-01-20 ENCOUNTER — Ambulatory Visit (INDEPENDENT_AMBULATORY_CARE_PROVIDER_SITE_OTHER): Payer: Medicare Other | Admitting: Internal Medicine

## 2022-01-20 ENCOUNTER — Encounter: Payer: Self-pay | Admitting: Internal Medicine

## 2022-01-20 ENCOUNTER — Other Ambulatory Visit: Payer: Self-pay

## 2022-01-20 VITALS — BP 111/73 | HR 82 | Resp 12 | Ht 63.0 in | Wt 131.4 lb

## 2022-01-20 DIAGNOSIS — G8929 Other chronic pain: Secondary | ICD-10-CM | POA: Diagnosis not present

## 2022-01-20 DIAGNOSIS — M069 Rheumatoid arthritis, unspecified: Secondary | ICD-10-CM | POA: Diagnosis not present

## 2022-01-20 DIAGNOSIS — M25512 Pain in left shoulder: Secondary | ICD-10-CM | POA: Diagnosis not present

## 2022-01-20 DIAGNOSIS — Z79899 Other long term (current) drug therapy: Secondary | ICD-10-CM | POA: Diagnosis not present

## 2022-01-20 DIAGNOSIS — M1712 Unilateral primary osteoarthritis, left knee: Secondary | ICD-10-CM

## 2022-01-21 ENCOUNTER — Other Ambulatory Visit: Payer: Self-pay | Admitting: Gastroenterology

## 2022-01-21 DIAGNOSIS — R1310 Dysphagia, unspecified: Secondary | ICD-10-CM | POA: Diagnosis not present

## 2022-01-21 DIAGNOSIS — K2101 Gastro-esophageal reflux disease with esophagitis, with bleeding: Secondary | ICD-10-CM | POA: Diagnosis not present

## 2022-01-21 DIAGNOSIS — K5900 Constipation, unspecified: Secondary | ICD-10-CM | POA: Diagnosis not present

## 2022-01-21 LAB — CBC WITH DIFFERENTIAL/PLATELET
Absolute Monocytes: 1376 cells/uL — ABNORMAL HIGH (ref 200–950)
Basophils Absolute: 84 cells/uL (ref 0–200)
Basophils Relative: 0.9 %
Eosinophils Absolute: 260 cells/uL (ref 15–500)
Eosinophils Relative: 2.8 %
HCT: 41.8 % (ref 35.0–45.0)
Hemoglobin: 13.6 g/dL (ref 11.7–15.5)
Lymphs Abs: 2874 cells/uL (ref 850–3900)
MCH: 31.8 pg (ref 27.0–33.0)
MCHC: 32.5 g/dL (ref 32.0–36.0)
MCV: 97.7 fL (ref 80.0–100.0)
MPV: 10.7 fL (ref 7.5–12.5)
Monocytes Relative: 14.8 %
Neutro Abs: 4706 cells/uL (ref 1500–7800)
Neutrophils Relative %: 50.6 %
Platelets: 200 10*3/uL (ref 140–400)
RBC: 4.28 10*6/uL (ref 3.80–5.10)
RDW: 15.2 % — ABNORMAL HIGH (ref 11.0–15.0)
Total Lymphocyte: 30.9 %
WBC: 9.3 10*3/uL (ref 3.8–10.8)

## 2022-01-21 LAB — COMPLETE METABOLIC PANEL WITH GFR
AG Ratio: 1.3 (calc) (ref 1.0–2.5)
ALT: 31 U/L — ABNORMAL HIGH (ref 6–29)
AST: 30 U/L (ref 10–35)
Albumin: 3.9 g/dL (ref 3.6–5.1)
Alkaline phosphatase (APISO): 97 U/L (ref 37–153)
BUN/Creatinine Ratio: 13 (calc) (ref 6–22)
BUN: 13 mg/dL (ref 7–25)
CO2: 36 mmol/L — ABNORMAL HIGH (ref 20–32)
Calcium: 8.8 mg/dL (ref 8.6–10.4)
Chloride: 101 mmol/L (ref 98–110)
Creat: 0.98 mg/dL — ABNORMAL HIGH (ref 0.60–0.95)
Globulin: 3 g/dL (calc) (ref 1.9–3.7)
Glucose, Bld: 101 mg/dL — ABNORMAL HIGH (ref 65–99)
Potassium: 4.4 mmol/L (ref 3.5–5.3)
Sodium: 139 mmol/L (ref 135–146)
Total Bilirubin: 0.8 mg/dL (ref 0.2–1.2)
Total Protein: 6.9 g/dL (ref 6.1–8.1)
eGFR: 56 mL/min/{1.73_m2} — ABNORMAL LOW (ref >=60)

## 2022-01-21 LAB — SEDIMENTATION RATE: Sed Rate: 33 mm/h — ABNORMAL HIGH (ref 0–30)

## 2022-01-27 ENCOUNTER — Other Ambulatory Visit: Payer: Medicare Other

## 2022-01-27 DIAGNOSIS — R509 Fever, unspecified: Secondary | ICD-10-CM | POA: Diagnosis not present

## 2022-01-27 DIAGNOSIS — J449 Chronic obstructive pulmonary disease, unspecified: Secondary | ICD-10-CM | POA: Diagnosis not present

## 2022-01-27 DIAGNOSIS — R0981 Nasal congestion: Secondary | ICD-10-CM | POA: Diagnosis not present

## 2022-01-27 DIAGNOSIS — Z20828 Contact with and (suspected) exposure to other viral communicable diseases: Secondary | ICD-10-CM | POA: Diagnosis not present

## 2022-01-27 NOTE — Progress Notes (Signed)
Lab results look okay for continuing the methotrexate. Her sedimentation rate is 33 which is a very small increase but may indicate some RA activity contributing to symptoms.

## 2022-01-28 DIAGNOSIS — R001 Bradycardia, unspecified: Secondary | ICD-10-CM | POA: Diagnosis not present

## 2022-01-29 ENCOUNTER — Other Ambulatory Visit: Payer: Self-pay

## 2022-01-29 ENCOUNTER — Telehealth (HOSPITAL_COMMUNITY): Payer: Self-pay

## 2022-01-29 ENCOUNTER — Encounter: Payer: Self-pay | Admitting: Primary Care

## 2022-01-29 ENCOUNTER — Ambulatory Visit (INDEPENDENT_AMBULATORY_CARE_PROVIDER_SITE_OTHER): Payer: Medicare Other | Admitting: Primary Care

## 2022-01-29 DIAGNOSIS — J9601 Acute respiratory failure with hypoxia: Secondary | ICD-10-CM | POA: Diagnosis not present

## 2022-01-29 DIAGNOSIS — J449 Chronic obstructive pulmonary disease, unspecified: Secondary | ICD-10-CM

## 2022-01-29 DIAGNOSIS — J9600 Acute respiratory failure, unspecified whether with hypoxia or hypercapnia: Secondary | ICD-10-CM | POA: Insufficient documentation

## 2022-01-29 MED ORDER — PREDNISONE 10 MG PO TABS: 20.0000 mg | ORAL_TABLET | Freq: Every day | ORAL | 0 refills | Status: DC

## 2022-01-29 NOTE — Patient Instructions (Addendum)
Recommendations: Start zpack today Start prednisone 20mg  daily x 5 days Continue Symbicort 160 two puffs morning and evening Take Spiriva respimat daily in the morning Use albuterol rescue inhaler every 6 hours for acute sob/wheezing Continue robitussin every 4 hours Wear 1L oxygen with moderate exertion and at bedtime to maintain O2 > 88%  Orders: New oxygen set up 1L continuous   Follow-up: 1 week with Eustaquio Maize NP

## 2022-01-29 NOTE — Telephone Encounter (Signed)
Detailed instructions left on the patient's answering machine. Asked to call back with any questions. Lisa Vega EMTP 

## 2022-01-29 NOTE — Progress Notes (Deleted)
@Patient  ID: Lisa Vega, female    DOB: Jul 31, 1932, 88 y.o.   MRN: [id number]  No chief complaint on file.   Referring provider: Binnie Rail, MD  HPI: 88 year old female, former smoker (20 pack years) followed COPD.    01/29/2022 She reports increased shortness of breath over the last 4-5 days UC Tuesday, given rocephin and zpack CXR showed no acute proces She took robitussin last night  She has ntoiced lower O2 saturation  COPD exacerbation     TEST/EVENTS:   Allergies  Allergen Reactions   Codeine     REACTION: hallucinations   Demerol    Meperidine Hcl     REACTION: severe GI upset   Meperidine Hcl    Other Other (See Comments)   Sulfa Antibiotics Rash    Immunization History  Administered Date(s) Administered   Fluad Quad(high Dose 65+) 12/08/2019, 10/31/2020   Influenza Split 10/18/2009, 09/28/2011, 10/28/2011, 09/28/2015, 10/09/2015   Influenza Whole 09/27/2012   Influenza, High Dose Seasonal PF 10/28/2016, 01/12/2017, 09/22/2017, 10/03/2018   Influenza,inj,Quad PF,6+ Mos 09/20/2013, 09/12/2014   Pneumococcal Conjugate-13 10/24/2015, 10/31/2020   Pneumococcal Polysaccharide-23 12/29/2007, 07/10/2008    Past Medical History:  Diagnosis Date   Anxiety    Arthritis    RA   BPV (benign positional vertigo) 03/10/2018   Cancer ([place])    lung ca   Carotid stenosis, left 09/19/2018   COPD (chronic obstructive pulmonary disease) ([facility])    Depression    Dyspnea    with exertion   GERD (gastroesophageal reflux disease)    Headache syndrome 03/09/2019   Hypercholesteremia    Hypothyroidism    Memory difficulties 10/28/2015   Pneumonia     x3  last time 2017   Pre-diabetes    Rheumatoid arthritis(714.0)    Thyroid disease    Tremor 10/28/2015   Jaw tremor   Uterine prolapse    Vitamin D deficiency     Tobacco History: Social History   Tobacco Use  Smoking Status Former   Packs/day: 1.00   Years: 20.00   Pack years: 20.00   Types:  Cigarettes   Quit date: 11/10/2004   Years since quitting: 17.2  Smokeless Tobacco Never   Counseling given: Not Answered   Outpatient Medications Prior to Visit  Medication Sig Dispense Refill   acetaminophen (TYLENOL) 325 MG tablet Take 650 mg by mouth every 6 (six) hours as needed for mild pain.     albuterol (PROAIR HFA) 108 (90 Base) MCG/ACT inhaler USE 1-2 PUFFS EVERY 4-6 HOURS AS NEEDED 8.5 g 5   ALPRAZolam (XANAX) 0.25 MG tablet TAKE 1 TABLET BY MOUTH EVERY DAY AS NEEDED FOR ANXIETY 30 tablet 2   aspirin EC 81 MG tablet Take 81 mg by mouth daily.     atorvastatin (LIPITOR) 20 MG tablet Take 1 tablet (20 mg total) by mouth daily. (Patient not taking: Reported on 01/20/2022) 90 tablet 3   B-D TB SYRINGE 1CC/27GX1/2" 27G X 1/2" 1 ML MISC For use with methotrexate injection weekly 25 each 1   budesonide-formoterol (SYMBICORT) 160-4.5 MCG/ACT inhaler USE 2 INHALATIONS ORALLY   TWICE DAILY 30.6 g 2   clidinium-chlordiazePOXIDE (LIBRAX) 5-2.5 MG capsule SMARTSIG:1 Capsule(s) By Mouth Every 12 Hours PRN     estradiol (ESTRACE) 0.1 MG/GM vaginal cream Place vaginally.     famotidine (PEPCID) 40 MG tablet TAKE 1 TABLET DAILY 90 tablet 3   folic acid (FOLVITE) 1 MG tablet Take 1 tablet (1 mg total) by  mouth daily. 90 tablet 1   gabapentin (NEURONTIN) 100 MG capsule Take 1 capsule (100 mg total) by mouth 3 (three) times daily. 270 capsule 3   glucose blood (ONETOUCH ULTRA) test strip Use to check blood sugars twice a day 100 each 5   ibuprofen (ADVIL,MOTRIN) 200 MG tablet Take 200 mg by mouth every 6 (six) hours as needed for headache or mild pain.     meclizine (ANTIVERT) 25 MG tablet Take 0.5-1 tablets (12.5-25 mg total) by mouth 3 (three) times daily as needed for dizziness. 60 tablet 5   Methotrexate Sodium (METHOTREXATE, PF,) 50 MG/2ML injection INJECT 0.7 MLS (17.5 MG TOTAL) INTO THE SKIN ONCE A WEEK. 4 mL 2   mupirocin ointment (BACTROBAN) 2 % Place 1 application into the nose 2 (two)  times daily. (Patient not taking: Reported on 01/20/2022) 22 g 1   omeprazole (PRILOSEC) 40 MG capsule Take 40 mg by mouth daily.     ONETOUCH DELICA LANCETS 95M MISC USE TO CHECK SUGARS UP TO 4 TIMES A DAY  0   Plecanatide (TRULANCE) 3 MG TABS Take 3 mg by mouth as needed. (Samples from GI) (Patient not taking: Reported on 01/20/2022)     sertraline (ZOLOFT) 100 MG tablet TAKE 1 TABLET BY MOUTH EVERY DAY 90 tablet 1   SPIRIVA RESPIMAT 2.5 MCG/ACT AERS USE 2 INHALATIONS ORALLY   DAILY (Patient taking differently: Inhale 2 puffs into the lungs daily as needed.) 12 g 1   SYNTHROID 88 MCG tablet TAKE 1 TABLET DAILY 90 tablet 1   traMADol (ULTRAM) 50 MG tablet Take 50 mg by mouth 3 (three) times daily as needed.     triamcinolone ointment (KENALOG) 0.1 % Apply 1 application topically 2 (two) times daily as needed. 30 g 3   Vitamin D, Cholecalciferol, 10 MCG (400 UNIT) CHEW Chew by mouth daily. Unsure of current dose     No facility-administered medications prior to visit.     Review of Systems:   Constitutional: No weight loss or gain, night sweats, fevers, chills, fatigue, or lassitude. HEENT: No headaches, difficulty swallowing, tooth/dental problems, or sore throat. No sneezing, itching, ear ache, nasal congestion, or post nasal drip CV:  No chest pain, orthopnea, PND, swelling in lower extremities, anasarca, dizziness, palpitations, syncope Resp: No shortness of breath with exertion or at rest. No excess mucus or change in color of mucus. No productive or non-productive. No hemoptysis. No wheezing.  No chest wall deformity GI:  No heartburn, indigestion, abdominal pain, nausea, vomiting, diarrhea, change in bowel habits, loss of appetite, bloody stools.  GU: No dysuria, change in color of urine, urgency or frequency.  No flank pain, no hematuria  Skin: No rash, lesions, ulcerations MSK:  No joint pain or swelling.  No decreased range of motion.  No back pain. Neuro: No dizziness or  lightheadedness.  Psych: No depression or anxiety. Mood stable.     Physical Exam:  There were no vitals taken for this visit.  GEN: Pleasant, interactive, well-nourished/chronically-ill appearing/acutely-ill appearing/poorly-nourished/morbidly obese; in no acute distress.****** HEENT:  Normocephalic and atraumatic. EACs patent bilaterally. TM pearly gray with present light reflex bilaterally. PERRLA. Sclera white. Nasal turbinates pink, moist and patent bilaterally. No rhinorrhea present. Oropharynx pink and moist, without exudate or edema. No lesions, ulcerations, or postnasal drip.  NECK:  Supple w/ fair ROM. No JVD present. Normal carotid impulses w/o bruits. Thyroid symmetrical with no goiter or nodules palpated. No lymphadenopathy.   CV: RRR, no m/r/g, no  peripheral edema. Pulses intact, +2 bilaterally. No cyanosis, pallor or clubbing. PULMONARY:  Unlabored, regular breathing. Clear bilaterally A&P w/o wheezes/rales/rhonchi. No accessory muscle use. No dullness to percussion. GI: BS present and normoactive. Soft, non-tender to palpation. No organomegaly or masses detected. No CVA tenderness. MSK: No erythema, warmth or tenderness. Cap refil <2 sec all extrem. No deformities or joint swelling noted.  Neuro: A/Ox3. No focal deficits noted.   Skin: Warm, no lesions or rashe Psych: Normal affect and behavior. Judgement and thought content appropriate.     Lab Results:  CBC    Component Value Date/Time   WBC 9.3 01/20/2022 1335   RBC 4.28 01/20/2022 1335   HGB 13.6 01/20/2022 1335   HGB 12.6 08/10/2011 1457   HCT 41.8 01/20/2022 1335   HCT 36.9 08/10/2011 1457   PLT 200 01/20/2022 1335   PLT 188 08/10/2011 1457   MCV 97.7 01/20/2022 1335   MCV 99.5 08/10/2011 1457   MCH 31.8 01/20/2022 1335   MCHC 32.5 01/20/2022 1335   RDW 15.2 (H) 01/20/2022 1335   RDW 14.9 (H) 08/10/2011 1457   LYMPHSABS 2,874 01/20/2022 1335   LYMPHSABS 2.9 08/10/2011 1457   MONOABS 0.5 08/01/2019 1215    MONOABS 0.6 08/10/2011 1457   EOSABS 260 01/20/2022 1335   EOSABS 0.1 08/10/2011 1457   BASOSABS 84 01/20/2022 1335   BASOSABS 0.0 08/10/2011 1457    BMET    Component Value Date/Time   NA 139 01/20/2022 1335   NA 139 08/10/2011 1457   K 4.4 01/20/2022 1335   K 4.4 08/10/2011 1457   CL 101 01/20/2022 1335   CL 98 08/10/2011 1457   CO2 36 (H) 01/20/2022 1335   CO2 29 08/10/2011 1457   GLUCOSE 101 (H) 01/20/2022 1335   GLUCOSE 98 08/10/2011 1457   BUN 13 01/20/2022 1335   BUN 15 08/10/2011 1457   CREATININE 0.98 (H) 01/20/2022 1335   CALCIUM 8.8 01/20/2022 1335   CALCIUM 8.9 08/10/2011 1457   GFRNONAA 54 (L) 06/25/2021 1339   GFRAA 63 06/25/2021 1339    BNP No results found for: BNP   Imaging:  No results found.    PFT Results Latest Ref Rng & Units 08/19/2018 01/24/2018  FVC-Pre L 1.99 2.02  FVC-Predicted Pre % 85 86  FVC-Post L 2.06 2.15  FVC-Predicted Post % 88 91  Pre FEV1/FVC % % 61 60  Post FEV1/FCV % % 62 63  FEV1-Pre L 1.21 1.20  FEV1-Predicted Pre % 70 69  FEV1-Post L 1.28 1.36  DLCO uncorrected ml/min/mmHg 11.06 11.52  DLCO UNC% % 48 50  DLCO corrected ml/min/mmHg 10.96 11.38  DLCO COR %Predicted % 47 49  DLVA Predicted % 72 70  TLC L 4.49 4.51  TLC % Predicted % 91 92  RV % Predicted % 103 101    No results found for: NITRICOXIDE      Assessment & Plan:   No problem-specific Assessment & Plan notes found for this encounter.     Clayton Bibles, NP 01/29/2022  Pt aware and understands NP's role.

## 2022-01-29 NOTE — Progress Notes (Signed)
@Patient  ID: Lisa Vega, female    DOB: Apr 12, 1932, 88 y.o.   MRN: [id number]  Chief Complaint  Patient presents with   Follow-up    C/o increased sob, cough-sm. Amt. White, wheezing, temp. 100 4-5days., mid back pain, decreased energy    Referring provider: Binnie Rail, MD  HPI: 88 year old female, former smoker (20 pack years). PMH significant for COPD, non-small cell lung cancer s/p lobectomy, GERD, diabetes, rheumatoid arthritis. Patient of Dr. Burnett Harry, last seen on 10/31/20.  01/29/2022- interim hx  Patient presents today for overdue follow-up. She reports increased symptoms of shortness of breath with cough over the last 4-5 days. She was evaluated at UC two days ago and given rocephin shot along with zpack. Daughter tells me she had a CXR which was normal and covid was negative. She has not yet started azithromycin. She took robitussin last night. She has noticed lower O2 saturation at home in the low 80s. Her daughter has noticed patient gets winded with simple activities which has been going on for some time.    Allergies  Allergen Reactions   Codeine     REACTION: hallucinations   Demerol    Meperidine Hcl     REACTION: severe GI upset   Meperidine Hcl    Other Other (See Comments)   Sulfa Antibiotics Rash    Immunization History  Administered Date(s) Administered   Fluad Quad(high Dose 65+) 12/08/2019, 10/31/2020   Influenza Split 10/18/2009, 09/28/2011, 10/28/2011, 09/28/2015, 10/09/2015   Influenza Whole 09/27/2012   Influenza, High Dose Seasonal PF 10/28/2016, 01/12/2017, 09/22/2017, 10/03/2018   Influenza,inj,Quad PF,6+ Mos 09/20/2013, 09/12/2014   Pneumococcal Conjugate-13 10/24/2015, 10/31/2020   Pneumococcal Polysaccharide-23 12/29/2007, 07/10/2008    Past Medical History:  Diagnosis Date   Anxiety    Arthritis    RA   BPV (benign positional vertigo) 03/10/2018   Cancer (HCC)    lung ca   Carotid stenosis, left 09/19/2018   COPD (chronic  obstructive pulmonary disease) (HCC)    Depression    Dyspnea    with exertion   GERD (gastroesophageal reflux disease)    Headache syndrome 03/09/2019   Hypercholesteremia    Hypothyroidism    Memory difficulties 10/28/2015   Pneumonia     x3  last time 2017   Pre-diabetes    Rheumatoid arthritis(714.0)    Thyroid disease    Tremor 10/28/2015   Jaw tremor   Uterine prolapse    Vitamin D deficiency     Tobacco History: Social History   Tobacco Use  Smoking Status Former   Packs/day: 1.00   Years: 20.00   Pack years: 20.00   Types: Cigarettes   Quit date: 11/10/2004   Years since quitting: 17.2  Smokeless Tobacco Never   Counseling given: Not Answered   Outpatient Medications Prior to Visit  Medication Sig Dispense Refill   acetaminophen (TYLENOL) 325 MG tablet Take 650 mg by mouth every 6 (six) hours as needed for mild pain.     albuterol (PROAIR HFA) 108 (90 Base) MCG/ACT inhaler USE 1-2 PUFFS EVERY 4-6 HOURS AS NEEDED 8.5 g 5   ALPRAZolam (XANAX) 0.25 MG tablet TAKE 1 TABLET BY MOUTH EVERY DAY AS NEEDED FOR ANXIETY 30 tablet 2   aspirin EC 81 MG tablet Take 81 mg by mouth daily.     atorvastatin (LIPITOR) 20 MG tablet Take 1 tablet (20 mg total) by mouth daily. 90 tablet 3   B-D TB SYRINGE 1CC/27GX1/2" 27G X 1/2"  1 ML MISC For use with methotrexate injection weekly 25 each 1   budesonide-formoterol (SYMBICORT) 160-4.5 MCG/ACT inhaler USE 2 INHALATIONS ORALLY   TWICE DAILY 30.6 g 2   clidinium-chlordiazePOXIDE (LIBRAX) 5-2.5 MG capsule SMARTSIG:1 Capsule(s) By Mouth Every 12 Hours PRN     estradiol (ESTRACE) 0.1 MG/GM vaginal cream Place vaginally.     famotidine (PEPCID) 40 MG tablet TAKE 1 TABLET DAILY 90 tablet 3   folic acid (FOLVITE) 1 MG tablet Take 1 tablet (1 mg total) by mouth daily. 90 tablet 1   gabapentin (NEURONTIN) 100 MG capsule Take 1 capsule (100 mg total) by mouth 3 (three) times daily. 270 capsule 3   glucose blood (ONETOUCH ULTRA) test strip Use  to check blood sugars twice a day 100 each 5   ibuprofen (ADVIL,MOTRIN) 200 MG tablet Take 200 mg by mouth every 6 (six) hours as needed for headache or mild pain.     meclizine (ANTIVERT) 25 MG tablet Take 0.5-1 tablets (12.5-25 mg total) by mouth 3 (three) times daily as needed for dizziness. 60 tablet 5   Methotrexate Sodium (METHOTREXATE, PF,) 50 MG/2ML injection INJECT 0.7 MLS (17.5 MG TOTAL) INTO THE SKIN ONCE A WEEK. 4 mL 2   mupirocin ointment (BACTROBAN) 2 % Place 1 application into the nose 2 (two) times daily. 22 g 1   omeprazole (PRILOSEC) 40 MG capsule Take 40 mg by mouth daily.     ONETOUCH DELICA LANCETS 76E MISC USE TO CHECK SUGARS UP TO 4 TIMES A DAY  0   Plecanatide (TRULANCE) 3 MG TABS Take 3 mg by mouth as needed. (Samples from GI)     sertraline (ZOLOFT) 100 MG tablet TAKE 1 TABLET BY MOUTH EVERY DAY 90 tablet 1   SPIRIVA RESPIMAT 2.5 MCG/ACT AERS USE 2 INHALATIONS ORALLY   DAILY (Patient taking differently: Inhale 2 puffs into the lungs daily as needed.) 12 g 1   SYNTHROID 88 MCG tablet TAKE 1 TABLET DAILY 90 tablet 1   traMADol (ULTRAM) 50 MG tablet Take 50 mg by mouth 3 (three) times daily as needed.     triamcinolone ointment (KENALOG) 0.1 % Apply 1 application topically 2 (two) times daily as needed. 30 g 3   Vitamin D, Cholecalciferol, 10 MCG (400 UNIT) CHEW Chew by mouth daily. Unsure of current dose     No facility-administered medications prior to visit.    Review of Systems  Review of Systems  Constitutional: Negative.   HENT: Negative.    Respiratory:  Positive for cough and shortness of breath.     Physical Exam  BP 138/60 (BP Location: Left Arm, Cuff Size: Normal)    Pulse 66    Temp 98.3 F (36.8 C) (Temporal)    Ht 5\' 3"  (1.6 m)    Wt 131 lb 12.8 oz (59.8 kg)    SpO2 93%    BMI 23.35 kg/m  Physical Exam Constitutional:      General: She is not in acute distress.    Appearance: Normal appearance. She is not ill-appearing.  HENT:     Head:  Normocephalic and atraumatic.  Cardiovascular:     Rate and Rhythm: Normal rate and regular rhythm.  Pulmonary:     Effort: Pulmonary effort is normal.     Breath sounds: Normal breath sounds.     Comments: CTA Musculoskeletal:        General: Normal range of motion.  Skin:    General: Skin is warm and dry.  Neurological:     Mental Status: She is alert.     Lab Results:  CBC    Component Value Date/Time   WBC 9.3 01/20/2022 1335   RBC 4.28 01/20/2022 1335   HGB 13.6 01/20/2022 1335   HGB 12.6 08/10/2011 1457   HCT 41.8 01/20/2022 1335   HCT 36.9 08/10/2011 1457   PLT 200 01/20/2022 1335   PLT 188 08/10/2011 1457   MCV 97.7 01/20/2022 1335   MCV 99.5 08/10/2011 1457   MCH 31.8 01/20/2022 1335   MCHC 32.5 01/20/2022 1335   RDW 15.2 (H) 01/20/2022 1335   RDW 14.9 (H) 08/10/2011 1457   LYMPHSABS 2,874 01/20/2022 1335   LYMPHSABS 2.9 08/10/2011 1457   MONOABS 0.5 08/01/2019 1215   MONOABS 0.6 08/10/2011 1457   EOSABS 260 01/20/2022 1335   EOSABS 0.1 08/10/2011 1457   BASOSABS 84 01/20/2022 1335   BASOSABS 0.0 08/10/2011 1457    BMET    Component Value Date/Time   NA 139 01/20/2022 1335   NA 139 08/10/2011 1457   K 4.4 01/20/2022 1335   K 4.4 08/10/2011 1457   CL 101 01/20/2022 1335   CL 98 08/10/2011 1457   CO2 36 (H) 01/20/2022 1335   CO2 29 08/10/2011 1457   GLUCOSE 101 (H) 01/20/2022 1335   GLUCOSE 98 08/10/2011 1457   BUN 13 01/20/2022 1335   BUN 15 08/10/2011 1457   CREATININE 0.98 (H) 01/20/2022 1335   CALCIUM 8.8 01/20/2022 1335   CALCIUM 8.9 08/10/2011 1457   GFRNONAA 54 (L) 06/25/2021 1339   GFRAA 63 06/25/2021 1339    BNP No results found for: BNP  ProBNP    Component Value Date/Time   PROBNP 163.0 (H) 08/06/2017 1016    Imaging: No results found.   Assessment & Plan:   COPD (chronic obstructive pulmonary disease) ([place]) - Patient has chronic symptoms of dyspnea, worse over the last week. She was treated for acute exacerbation at  [facility] recently. CXR was reportedly normal and covid negative. She has new oxygen requirements with exertion. Her respiratory exam was normal. She is in no acute respiratory distress. Recommend she started zpack as directed and prednisone 20mg  x 5 days. Continue Symbicort 160 two puffs BID, Spiriva respimat daily in the morning and albuterol rescue inhaler every 6 hours for acute sob/wheezing. Continue robitussin every 4 hours. Follow-up in 1 week.   Acute respiratory failure (HCC) - Patient's baseline O2 typically runs in the low 90's on room air. She has moderate COPD. O2 desaturated today to 87% after ambulating 50-145ft , requiring 1 L to maintain >90%. Encourage patient wear oxygen at night as well. Sending patient home with portable concentrator and order for new oxygen start.    Martyn Ehrich, NP 01/29/2022

## 2022-01-29 NOTE — Assessment & Plan Note (Addendum)
-   Patient has chronic symptoms of dyspnea, worse over the last week. She was treated for acute exacerbation at [facility] recently. CXR was reportedly normal and covid negative. She has new oxygen requirements with exertion. Her respiratory exam was normal. She is in no acute respiratory distress. Recommend she started zpack as directed and prednisone 20mg  x 5 days. Continue Symbicort 160 two puffs BID, Spiriva respimat daily in the morning and albuterol rescue inhaler every 6 hours for acute sob/wheezing. Continue robitussin every 4 hours. Follow-up in 1 week.

## 2022-01-29 NOTE — Assessment & Plan Note (Addendum)
-   Patient's baseline O2 typically runs in the low 90's on room air. She has moderate COPD. O2 desaturated today to 87% after ambulating 50-155ft , requiring 1 L to maintain >90%. Encourage patient wear oxygen at night as well. Sending patient home with portable concentrator and order for new oxygen start.

## 2022-02-03 ENCOUNTER — Encounter (HOSPITAL_COMMUNITY): Payer: Medicare Other

## 2022-02-03 ENCOUNTER — Other Ambulatory Visit (HOSPITAL_COMMUNITY): Payer: Medicare Other

## 2022-02-04 ENCOUNTER — Ambulatory Visit: Payer: Medicare Other | Admitting: Nurse Practitioner

## 2022-02-05 ENCOUNTER — Ambulatory Visit (INDEPENDENT_AMBULATORY_CARE_PROVIDER_SITE_OTHER): Payer: Medicare Other | Admitting: Primary Care

## 2022-02-05 ENCOUNTER — Encounter: Payer: Self-pay | Admitting: Primary Care

## 2022-02-05 ENCOUNTER — Other Ambulatory Visit: Payer: Self-pay

## 2022-02-05 VITALS — BP 126/58 | HR 69 | Temp 97.9°F | Ht 63.0 in | Wt 133.6 lb

## 2022-02-05 DIAGNOSIS — J449 Chronic obstructive pulmonary disease, unspecified: Secondary | ICD-10-CM | POA: Diagnosis not present

## 2022-02-05 DIAGNOSIS — J0101 Acute recurrent maxillary sinusitis: Secondary | ICD-10-CM

## 2022-02-05 DIAGNOSIS — J9601 Acute respiratory failure with hypoxia: Secondary | ICD-10-CM

## 2022-02-05 MED ORDER — AMOXICILLIN-POT CLAVULANATE 875-125 MG PO TABS: 1.0000 | ORAL_TABLET | Freq: Two times a day (BID) | ORAL | 0 refills | Status: DC

## 2022-02-05 NOTE — Patient Instructions (Addendum)
°  Recommendations: Continue Symbicort 160 two puffs morning and evening (rinse mouth after use) Continue to wear 2L oxygen with any moderate exertion and at night Use ocean saline spray twice daily as needed for nasal congestion Hold methotrexate while taking antibiotic   Orders: Please qualify for POC / if unable to do please place order for best fit test with [employer]  Overnight oximetry test on room air (ordered)  Rx: Augmentin 1 tab twice daily x 5 days   Follow-up: 2-3 months with Dr. Ander Slade or sooner if needed

## 2022-02-05 NOTE — Progress Notes (Signed)
@Patient  ID: Lisa Vega, female    DOB: 1932-02-05, 88 y.o.   MRN: [id number]  Chief Complaint  Patient presents with   Follow-up    Recheck oxygen qualifier     Referring provider: Binnie Rail, MD  HPI: 88 year old female, former smoker (20 pack years). PMH significant for COPD, non-small cell lung cancer s/p lobectomy, GERD, diabetes, rheumatoid arthritis. Patient of Dr. Burnett Harry, last seen on 10/31/20.  Previous [facility] pulmonary encounter  01/29/2022 Patient presents today for overdue follow-up. She reports increased symptoms of shortness of breath with cough over the last 4-5 days. She was evaluated at UC two days ago and given rocephin shot along with zpack. Daughter tells me she had a CXR which was normal and covid was negative. She has not yet started azithromycin. She took robitussin last night. She has noticed lower O2 saturation at home in the low 80s. Her daughter has noticed patient gets winded with simple activities which has been going on for some time.   02/05/2022- interim hx  Patient presents today for 1 week follow-up. Accompanied by her daughter. She is feeling some better, still weak. She completed zpack and prednisone x 5 days. She still having some chest tightness and sinus pressure/congestion. She is getting up thick clear mucus. During last office visit she was started on supplemental oxygen at 1L. She is still requiring oxygen with moderate exertion.    Allergies  Allergen Reactions   Codeine     REACTION: hallucinations   Demerol    Meperidine Hcl     REACTION: severe GI upset   Meperidine Hcl    Other Other (See Comments)   Sulfa Antibiotics Rash    Immunization History  Administered Date(s) Administered   Fluad Quad(high Dose 65+) 12/08/2019, 10/31/2020   Influenza Split 10/18/2009, 09/28/2011, 10/28/2011, 09/28/2015, 10/09/2015   Influenza Whole 09/27/2012   Influenza, High Dose Seasonal PF 10/28/2016, 01/12/2017, 09/22/2017, 10/03/2018    Influenza,inj,Quad PF,6+ Mos 09/20/2013, 09/12/2014   Pneumococcal Conjugate-13 10/24/2015, 10/31/2020   Pneumococcal Polysaccharide-23 12/29/2007, 07/10/2008    Past Medical History:  Diagnosis Date   Anxiety    Arthritis    RA   BPV (benign positional vertigo) 03/10/2018   Cancer (HCC)    lung ca   Carotid stenosis, left 09/19/2018   COPD (chronic obstructive pulmonary disease) (HCC)    Depression    Dyspnea    with exertion   GERD (gastroesophageal reflux disease)    Headache syndrome 03/09/2019   Hypercholesteremia    Hypothyroidism    Memory difficulties 10/28/2015   Pneumonia     x3  last time 2017   Pre-diabetes    Rheumatoid arthritis(714.0)    Thyroid disease    Tremor 10/28/2015   Jaw tremor   Uterine prolapse    Vitamin D deficiency     Tobacco History: Social History   Tobacco Use  Smoking Status Former   Packs/day: 1.00   Years: 20.00   Pack years: 20.00   Types: Cigarettes   Quit date: 11/10/2004   Years since quitting: 17.2  Smokeless Tobacco Never   Counseling given: Not Answered   Outpatient Medications Prior to Visit  Medication Sig Dispense Refill   acetaminophen (TYLENOL) 325 MG tablet Take 650 mg by mouth every 6 (six) hours as needed for mild pain.     albuterol (PROAIR HFA) 108 (90 Base) MCG/ACT inhaler USE 1-2 PUFFS EVERY 4-6 HOURS AS NEEDED 8.5 g 5   ALPRAZolam (XANAX) 0.25  MG tablet TAKE 1 TABLET BY MOUTH EVERY DAY AS NEEDED FOR ANXIETY 30 tablet 2   aspirin EC 81 MG tablet Take 81 mg by mouth daily.     atorvastatin (LIPITOR) 20 MG tablet Take 1 tablet (20 mg total) by mouth daily. 90 tablet 3   B-D TB SYRINGE 1CC/27GX1/2" 27G X 1/2" 1 ML MISC For use with methotrexate injection weekly 25 each 1   budesonide-formoterol (SYMBICORT) 160-4.5 MCG/ACT inhaler USE 2 INHALATIONS ORALLY   TWICE DAILY 30.6 g 2   clidinium-chlordiazePOXIDE (LIBRAX) 5-2.5 MG capsule SMARTSIG:1 Capsule(s) By Mouth Every 12 Hours PRN     estradiol (ESTRACE) 0.1  MG/GM vaginal cream Place vaginally.     famotidine (PEPCID) 40 MG tablet TAKE 1 TABLET DAILY 90 tablet 3   folic acid (FOLVITE) 1 MG tablet Take 1 tablet (1 mg total) by mouth daily. 90 tablet 1   gabapentin (NEURONTIN) 100 MG capsule Take 1 capsule (100 mg total) by mouth 3 (three) times daily. 270 capsule 3   glucose blood (ONETOUCH ULTRA) test strip Use to check blood sugars twice a day 100 each 5   ibuprofen (ADVIL,MOTRIN) 200 MG tablet Take 200 mg by mouth every 6 (six) hours as needed for headache or mild pain.     meclizine (ANTIVERT) 25 MG tablet Take 0.5-1 tablets (12.5-25 mg total) by mouth 3 (three) times daily as needed for dizziness. 60 tablet 5   Methotrexate Sodium (METHOTREXATE, PF,) 50 MG/2ML injection INJECT 0.7 MLS (17.5 MG TOTAL) INTO THE SKIN ONCE A WEEK. 4 mL 2   mupirocin ointment (BACTROBAN) 2 % Place 1 application into the nose 2 (two) times daily. 22 g 1   omeprazole (PRILOSEC) 40 MG capsule Take 40 mg by mouth daily.     ONETOUCH DELICA LANCETS 50K MISC USE TO CHECK SUGARS UP TO 4 TIMES A DAY  0   Plecanatide (TRULANCE) 3 MG TABS Take 3 mg by mouth as needed. (Samples from GI)     predniSONE (DELTASONE) 10 MG tablet Take 2 tablets (20 mg total) by mouth daily with breakfast. 10 tablet 0   sertraline (ZOLOFT) 100 MG tablet TAKE 1 TABLET BY MOUTH EVERY DAY 90 tablet 1   SPIRIVA RESPIMAT 2.5 MCG/ACT AERS USE 2 INHALATIONS ORALLY   DAILY (Patient taking differently: Inhale 2 puffs into the lungs daily as needed.) 12 g 1   SYNTHROID 88 MCG tablet TAKE 1 TABLET DAILY 90 tablet 1   traMADol (ULTRAM) 50 MG tablet Take 50 mg by mouth 3 (three) times daily as needed.     triamcinolone ointment (KENALOG) 0.1 % Apply 1 application topically 2 (two) times daily as needed. 30 g 3   Vitamin D, Cholecalciferol, 10 MCG (400 UNIT) CHEW Chew by mouth daily. Unsure of current dose     No facility-administered medications prior to visit.   Review of Systems  Review of Systems   Constitutional: Negative.   HENT:  Positive for congestion and sinus pressure.   Respiratory:  Positive for shortness of breath. Negative for cough, chest tightness and wheezing.   Cardiovascular: Negative.     Physical Exam  BP (!) 126/58 (BP Location: Left Arm, Patient Position: Sitting)    Pulse 69    Temp 97.9 F (36.6 C) (Oral)    Ht 5\' 3"  (1.6 m)    Wt 133 lb 9.6 oz (60.6 kg)    SpO2 95%    BMI 23.67 kg/m  Physical Exam Constitutional:  Appearance: Normal appearance.  HENT:     Head: Normocephalic and atraumatic.  Cardiovascular:     Rate and Rhythm: Normal rate and regular rhythm.  Pulmonary:     Effort: Pulmonary effort is normal.     Breath sounds: Normal breath sounds.     Comments: CTA Musculoskeletal:        General: Normal range of motion.  Skin:    General: Skin is warm and dry.  Neurological:     General: No focal deficit present.     Mental Status: She is alert and oriented to person, place, and time. Mental status is at baseline.  Psychiatric:        Mood and Affect: Mood normal.        Behavior: Behavior normal.        Thought Content: Thought content normal.        Judgment: Judgment normal.     Lab Results:  CBC    Component Value Date/Time   WBC 9.3 01/20/2022 1335   RBC 4.28 01/20/2022 1335   HGB 13.6 01/20/2022 1335   HGB 12.6 08/10/2011 1457   HCT 41.8 01/20/2022 1335   HCT 36.9 08/10/2011 1457   PLT 200 01/20/2022 1335   PLT 188 08/10/2011 1457   MCV 97.7 01/20/2022 1335   MCV 99.5 08/10/2011 1457   MCH 31.8 01/20/2022 1335   MCHC 32.5 01/20/2022 1335   RDW 15.2 (H) 01/20/2022 1335   RDW 14.9 (H) 08/10/2011 1457   LYMPHSABS 2,874 01/20/2022 1335   LYMPHSABS 2.9 08/10/2011 1457   MONOABS 0.5 08/01/2019 1215   MONOABS 0.6 08/10/2011 1457   EOSABS 260 01/20/2022 1335   EOSABS 0.1 08/10/2011 1457   BASOSABS 84 01/20/2022 1335   BASOSABS 0.0 08/10/2011 1457    BMET    Component Value Date/Time   NA 139 01/20/2022 1335   NA  139 08/10/2011 1457   K 4.4 01/20/2022 1335   K 4.4 08/10/2011 1457   CL 101 01/20/2022 1335   CL 98 08/10/2011 1457   CO2 36 (H) 01/20/2022 1335   CO2 29 08/10/2011 1457   GLUCOSE 101 (H) 01/20/2022 1335   GLUCOSE 98 08/10/2011 1457   BUN 13 01/20/2022 1335   BUN 15 08/10/2011 1457   CREATININE 0.98 (H) 01/20/2022 1335   CALCIUM 8.8 01/20/2022 1335   CALCIUM 8.9 08/10/2011 1457   GFRNONAA 54 (L) 06/25/2021 1339   GFRAA 63 06/25/2021 1339    BNP No results found for: BNP  ProBNP    Component Value Date/Time   PROBNP 163.0 (H) 08/06/2017 1016    Imaging: LONG TERM MONITOR (3-14 DAYS)  Result Date: 02/08/2022  20 episodes of SVT, longest lasting 17 beats  Occasional PACs (1.1% of beats)  Occasional PVCs (3.7% of beats)  Patch Wear Time:  14 days and 0 hours (2023-01-10T12:46:01-499 to 2023-01-24T12:46:05-499) Patient had a min HR of 49 bpm, max HR of 174 bpm, and avg HR of 69 bpm. Predominant underlying rhythm was Sinus Rhythm. Intermittent Bundle Branch Block was present. QRS morphology changes were present throughout recording. 20 Supraventricular Tachycardia runs occurred, the run with the fastest interval lasting 6 beats with a max rate of 174 bpm, the longest lasting 17 beats with an avg rate of 99 bpm. Isolated SVEs were occasional (1.1%, 14472), SVE Couplets were rare (<1.0%, 1739), and SVE Triplets were rare (<1.0%, 381). Isolated VEs were occasional (3.7%, 46524), VE Couplets were rare (<1.0%, 1946), and VE Triplets were rare (<1.0%, 262).  Ventricular Bigeminy and Trigeminy were present.  No patient triggered events     Assessment & Plan:   COPD (chronic obstructive pulmonary disease) ([place]) - Patient continues to have dyspnea symptoms with exertion, symptoms more consistent with worsening of her underlying COPD than an acute flare up. She is compliant with Symbicort 160 and Spiriva respimat. Referring to pulmonary rehab.   Acute respiratory failure ([place]) - Patient  is still requiring 1-2L supplemental oxygen with exertion. Qualified for POC. Reinforced importance of using oxygen with any exertion and at night. We will also check ONO.   Sinusitis - Acute on chronic sinusitis. Rx Augmentin 1 tab twice daily x 5 days. Advised patient use ocean saline nasal rinses 1-2 times a day and mucinex 600mg  BID prn congestion.    Martyn Ehrich, NP 02/09/2022

## 2022-02-06 ENCOUNTER — Telehealth: Payer: Self-pay | Admitting: Primary Care

## 2022-02-06 NOTE — Telephone Encounter (Signed)
Noted.  Will close encounter.  

## 2022-02-06 NOTE — Telephone Encounter (Signed)
Called and spoke with patient's daughter Lisa Vega. She stated that last night she checked her mother's heart rate and it was 28bpm. She has been checking it every hour this morning and it has ranged from 48-68bpm. Her mother overall does not feel good and seems to be weak. She was seen by [facility] yesterday and was started on amoxicillin.   I asked if she had contacted her PCP Dr. Quay Burow or her cardiologist and she stated that she has not because she feels like the low heart rate and weakness is due to her lungs.   She denied any increased SOB or wheezing.   I advised her that if her heart rate drops to 28, she will need to call 911.   Judson Roch, can you please advise since Lisa Vega is not available today? Thanks!

## 2022-02-09 DIAGNOSIS — J329 Chronic sinusitis, unspecified: Secondary | ICD-10-CM | POA: Insufficient documentation

## 2022-02-09 NOTE — Assessment & Plan Note (Addendum)
-   Acute on chronic sinusitis. Rx Augmentin 1 tab twice daily x 5 days. Advised patient use ocean saline nasal rinses 1-2 times a day and mucinex 600mg  BID prn congestion.

## 2022-02-09 NOTE — Telephone Encounter (Signed)
She wore monitor for 14 days, no significant bradycardia seen.  She did have occasional PVCs, possible that if she is having PVCs her home monitor is underestimating heart rate.  If she is continuing to have weakness would recommend being seen in urgent care

## 2022-02-09 NOTE — Assessment & Plan Note (Signed)
-   Patient continues to have dyspnea symptoms with exertion, symptoms more consistent with worsening of her underlying COPD than an acute flare up. She is compliant with Symbicort 160 and Spiriva respimat. Referring to pulmonary rehab.

## 2022-02-09 NOTE — Assessment & Plan Note (Addendum)
-   Patient is still requiring 1-2L supplemental oxygen with exertion. Qualified for POC. Reinforced importance of using oxygen with any exertion and at night. We will also check ONO.

## 2022-02-10 ENCOUNTER — Telehealth (HOSPITAL_COMMUNITY): Payer: Self-pay | Admitting: Cardiology

## 2022-02-10 NOTE — Telephone Encounter (Signed)
I called to reschedule Myoview and echocardiogram for patient.  Patient did reschedule echo but does not want to have the Myoview at this time due to she is on oxygen and weak and concerned she will not be able to do at this time.  Order will be removed form the Active [facility] WQ and if patient calls back or MD still wants patient to have we will reinstate the order. Thank you.

## 2022-02-10 NOTE — Telephone Encounter (Signed)
Spoke with pt daughter, aware of dr schumann's recommendations. Daughter reports she has also had a COPD problems and is currently on oxygen.

## 2022-02-15 ENCOUNTER — Other Ambulatory Visit: Payer: Self-pay | Admitting: Internal Medicine

## 2022-02-17 ENCOUNTER — Encounter: Payer: Self-pay | Admitting: Primary Care

## 2022-02-17 DIAGNOSIS — R0683 Snoring: Secondary | ICD-10-CM | POA: Diagnosis not present

## 2022-02-17 DIAGNOSIS — G4730 Sleep apnea, unspecified: Secondary | ICD-10-CM | POA: Diagnosis not present

## 2022-02-18 DIAGNOSIS — M25552 Pain in left hip: Secondary | ICD-10-CM | POA: Diagnosis not present

## 2022-02-18 DIAGNOSIS — S72145A Nondisplaced intertrochanteric fracture of left femur, initial encounter for closed fracture: Secondary | ICD-10-CM | POA: Diagnosis not present

## 2022-02-19 ENCOUNTER — Other Ambulatory Visit: Payer: Self-pay

## 2022-02-19 ENCOUNTER — Ambulatory Visit (HOSPITAL_COMMUNITY): Payer: Medicare Other | Attending: Cardiology

## 2022-02-19 DIAGNOSIS — R072 Precordial pain: Secondary | ICD-10-CM | POA: Insufficient documentation

## 2022-02-19 LAB — ECHOCARDIOGRAM COMPLETE
Area-P 1/2: 1.8 cm2
S' Lateral: 3.1 cm

## 2022-02-23 ENCOUNTER — Telehealth: Payer: Self-pay | Admitting: Cardiology

## 2022-02-23 NOTE — Telephone Encounter (Signed)
Spoke with pt daughter, aware of echo results.  Follow up scheduled

## 2022-02-23 NOTE — Telephone Encounter (Signed)
Patient's daughter is returning call to discuss echo results.

## 2022-02-25 ENCOUNTER — Other Ambulatory Visit: Payer: Medicare Other

## 2022-02-27 ENCOUNTER — Ambulatory Visit
Admission: RE | Admit: 2022-02-27 | Discharge: 2022-02-27 | Disposition: A | Discharge status: Home / Self Care | Payer: Medicare Other | Source: Ambulatory Visit | Attending: Gastroenterology | Admitting: Gastroenterology

## 2022-02-27 DIAGNOSIS — K224 Dyskinesia of esophagus: Secondary | ICD-10-CM | POA: Diagnosis not present

## 2022-02-27 DIAGNOSIS — R1310 Dysphagia, unspecified: Secondary | ICD-10-CM | POA: Diagnosis not present

## 2022-03-06 DIAGNOSIS — L718 Other rosacea: Secondary | ICD-10-CM | POA: Diagnosis not present

## 2022-03-06 DIAGNOSIS — L0202 Furuncle of face: Secondary | ICD-10-CM | POA: Diagnosis not present

## 2022-03-06 DIAGNOSIS — B9689 Other specified bacterial agents as the cause of diseases classified elsewhere: Secondary | ICD-10-CM | POA: Diagnosis not present

## 2022-03-08 NOTE — Progress Notes (Unsigned)
Cardiology Office Note:    Date:  03/08/2022   ID:  Lisa Vega, DOB 1932/12/22, MRN [id number]  PCP:  Binnie Rail, MD  Cardiologist:  Kathlyn Sacramento, MD  Electrophysiologist:  None   Referring MD: Binnie Rail, MD   No chief complaint on file.   History of Present Illness:    Lisa Vega is a 88 y.o. female with a hx of chronic combined systolic and diastolic heart failure, lung cancer, carotid stenosis, COPD, GERD, hyperlipidemia, hypothyroidism, rheumatoid arthritis who presents for follow-up.  She was referred by Dr. Quay Burow for evaluation of bradycardia.  She reports has noted heart rate in the 30s when checks pulse ox at home.  Does feel weak when heart rate is low.  She denies any lightheadedness or syncope.  Reports has been having tightness in her lower chest.  Circles around to her back.  No clear elation of with exertion, but does report she gets short of breath with minimal exertion.  She denies any lower extremity edema or palpitations.  She quit smoking 2005.  Family history includes brother died of MI in 85s, sister died of MI in 55s, father brother had multiple stents placed in his 73s.  Echocardiogram 02/19/2022 showed EF 40 to 45%, global hypokinesis, mild RV dysfunction, mild to moderate TR.  Zio patch x14 days on 01/28/2022 showed occasional PVCs (3.7%), occasional PACs (1.1%), 20 episodes of SVT with longest lasting 17 beats.  Since last clinic visit,   Past Medical History:  Diagnosis Date   Anxiety    Arthritis    RA   BPV (benign positional vertigo) 03/10/2018   Cancer (HCC)    lung ca   Carotid stenosis, left 09/19/2018   COPD (chronic obstructive pulmonary disease) (HCC)    Depression    Dyspnea    with exertion   GERD (gastroesophageal reflux disease)    Headache syndrome 03/09/2019   Hypercholesteremia    Hypothyroidism    Memory difficulties 10/28/2015   Pneumonia     x3  last time 2017   Pre-diabetes    Rheumatoid arthritis(714.0)     Thyroid disease    Tremor 10/28/2015   Jaw tremor   Uterine prolapse    Vitamin D deficiency     Past Surgical History:  Procedure Laterality Date   ANGIOPLASTY Left 10/10/2018   Procedure: ANGIOPLASTY using 1cm x 14cm Xenosure Patch;  Surgeon: Marty Heck, MD;  Location: [facility] OR;  Service: Vascular;  Laterality: Left;   APPENDECTOMY     COLONOSCOPY W/ POLYPECTOMY     ENDARTERECTOMY Left 10/10/2018   Procedure: ENDARTERECTOMY CAROTID;  Surgeon: Marty Heck, MD;  Location: [facility] OR;  Service: Vascular;  Laterality: Left;   EYE SURGERY Bilateral    cataract   FOOT SURGERY Bilateral    arthritis   Hands  Bilateral    Arthritis   HEMORRHOID SURGERY     right lobectomy  10/2004   TONSILLECTOMY AND ADENOIDECTOMY      Current Medications: No outpatient medications have been marked as taking for the 03/10/22 encounter (Appointment) with Donato Heinz, MD.     Allergies:   Codeine, Demerol, Meperidine hcl, Meperidine hcl, Other, and Sulfa antibiotics   Social History   Socioeconomic History   Marital status: Widowed    Spouse name: Not on file   Number of children: 3   Years of education: LPN   Highest education level: Not on file  Occupational History   Occupation:  retired    Fish farm manager: RETIRED    Comment: LPN at [employer]  Tobacco Use   Smoking status: Former    Packs/day: 1.00    Years: 20.00    Pack years: 20.00    Types: Cigarettes    Quit date: 11/10/2004    Years since quitting: 17.3   Smokeless tobacco: Never  Vaping Use   Vaping Use: Never used  Substance and Sexual Activity   Alcohol use: Yes    Alcohol/week: 1.0 standard drink    Types: 1 Glasses of wine per week    Comment: socially   Drug use: No   Sexual activity: Not Currently  Other Topics Concern   Not on file  Social History Narrative   09/09/21 lives with dgtr Lisa Vega; widowed   Patient drinks about 3 cups of coffee daily.   Patient is right handed.     Retired Corporate treasurer at [employer] Strain: Low Risk    Difficulty of Paying Living Expenses: Not hard at all  Food Insecurity: No Food Insecurity   Worried About Charity fundraiser in the Last Year: Never true   Arboriculturist in the Last Year: Never true  Transportation Needs: No Transportation Needs   Lack of Transportation (Medical): No   Lack of Transportation (Non-Medical): No  Physical Activity: Inactive   Days of Exercise per Week: 0 days   Minutes of Exercise per Session: 0 min  Stress: No Stress Concern Present   Feeling of Stress : Not at all  Social Connections: Socially Isolated   Frequency of Communication with Friends and Family: More than three times a week   Frequency of Social Gatherings with Friends and Family: Once a week   Attends Religious Services: Never   Marine scientist or Organizations: No   Attends Archivist Meetings: Never   Marital Status: Widowed     Family History: The patient's family history includes Asthma in her mother; Bone cancer in her father; Diabetes in her brother, brother, sister, and sister; Emphysema in her mother; Heart disease in her brother and sister; Hypertension in her brother, brother, sister, sister, and son; Lung cancer in her father; Lupus in her brother; Pancreatic cancer in her father; Stroke in her maternal grandfather; Tremor in her son.  ROS:   Please see the history of present illness.     All other systems reviewed and are negative.  EKGs/Labs/Other Studies Reviewed:    The following studies were reviewed today:   EKG:   01/01/22: Normal sinus rhythm, sinus arrhythmia, rate 70, left bundle branch block  Recent Labs: 12/02/2021: TSH 0.72 01/20/2022: ALT 31; BUN 13; Creat 0.98; Hemoglobin 13.6; Platelets 200; Potassium 4.4; Sodium 139  Recent Lipid Panel    Component Value Date/Time   CHOL 195 06/02/2021 1412   CHOL 245 (H) 04/21/2018  0821   TRIG 124.0 06/02/2021 1412   HDL 58.30 06/02/2021 1412   HDL 63 04/21/2018 0821   CHOLHDL 3 06/02/2021 1412   VLDL 24.8 06/02/2021 1412   LDLCALC 112 (H) 06/02/2021 1412   LDLCALC 148 (H) 04/21/2018 2671    Physical Exam:    VS:  There were no vitals taken for this visit.    Wt Readings from Last 3 Encounters:  02/05/22 133 lb 9.6 oz (60.6 kg)  01/29/22 131 lb 12.8 oz (59.8 kg)  01/20/22 131 lb 6.4 oz (59.6 kg)  GEN:  Well nourished, well developed in no acute distress HEENT: Normal NECK: No JVD; No carotid bruits LYMPHATICS: No lymphadenopathy CARDIAC: RRR, no murmurs, rubs, gallops RESPIRATORY:  Clear to auscultation without rales, wheezing or rhonchi  ABDOMEN: Soft, non-tender, non-distended MUSCULOSKELETAL:  No edema; No deformity  SKIN: Warm and dry NEUROLOGIC:  Alert and oriented x 3 PSYCHIATRIC:  Normal affect   ASSESSMENT:    No diagnosis found.  PLAN:    Chronic combined systolic and diastolic heart failure: Echocardiogram 02/19/2022 showed EF 40 to 45%, global hypokinesis, mild RV dysfunction, mild to moderate TR. she is reporting atypical chest pain.  EKG with left bundle branch block -Recommend cardiac catheterization for further evaluation***  Bradycardia: Reports heart rate down to 30s at home and episodes of weakness.  Zio patch x14 days on 01/28/2022 showed occasional PVCs (3.7%), occasional PACs (1.1%), 20 episodes of SVT with longest lasting 17 beats.  Chest pain: Atypical in description but does have significant CAD risk factors and has known carotid stenosis.  Given reduced systolic function, recommend ischemic evaluation.  Plan for cardiac catheterization as above  Carotid stenosis: Status post left CEA 09/2018.  Carotid duplex 03/2021 showed 40 to 59% right stenosis and 1 to 39% left stenosis -Follows with vascular surgery -Continue aspirin 81 mg daily -LDL 112 on 06/02/2021, will start atorvastatin 20 mg daily  RTC in  ***     Medication Adjustments/Labs and Tests Ordered: Current medicines are reviewed at length with the patient today.  Concerns regarding medicines are outlined above.  No orders of the defined types were placed in this encounter.  No orders of the defined types were placed in this encounter.   There are no Patient Instructions on file for this visit.   Signed, Donato Heinz, MD  03/08/2022 3:24 PM    [facility]

## 2022-03-10 ENCOUNTER — Ambulatory Visit (INDEPENDENT_AMBULATORY_CARE_PROVIDER_SITE_OTHER): Payer: Medicare Other | Admitting: Neurology

## 2022-03-10 ENCOUNTER — Encounter: Payer: Self-pay | Admitting: Neurology

## 2022-03-10 ENCOUNTER — Encounter: Payer: Self-pay | Admitting: Cardiology

## 2022-03-10 ENCOUNTER — Other Ambulatory Visit: Payer: Self-pay

## 2022-03-10 ENCOUNTER — Ambulatory Visit (INDEPENDENT_AMBULATORY_CARE_PROVIDER_SITE_OTHER): Payer: Medicare Other | Admitting: Cardiology

## 2022-03-10 ENCOUNTER — Other Ambulatory Visit: Payer: Self-pay | Admitting: *Deleted

## 2022-03-10 VITALS — BP 108/68 | HR 72 | Ht 63.0 in | Wt 132.0 lb

## 2022-03-10 VITALS — BP 101/76 | HR 79 | Ht 63.0 in | Wt 131.5 lb

## 2022-03-10 DIAGNOSIS — I447 Left bundle-branch block, unspecified: Secondary | ICD-10-CM | POA: Diagnosis not present

## 2022-03-10 DIAGNOSIS — G4489 Other headache syndrome: Secondary | ICD-10-CM | POA: Diagnosis not present

## 2022-03-10 DIAGNOSIS — I6523 Occlusion and stenosis of bilateral carotid arteries: Secondary | ICD-10-CM

## 2022-03-10 DIAGNOSIS — R072 Precordial pain: Secondary | ICD-10-CM | POA: Diagnosis not present

## 2022-03-10 DIAGNOSIS — H8110 Benign paroxysmal vertigo, unspecified ear: Secondary | ICD-10-CM

## 2022-03-10 DIAGNOSIS — Z79899 Other long term (current) drug therapy: Secondary | ICD-10-CM

## 2022-03-10 DIAGNOSIS — I5042 Chronic combined systolic (congestive) and diastolic (congestive) heart failure: Secondary | ICD-10-CM

## 2022-03-10 DIAGNOSIS — R001 Bradycardia, unspecified: Secondary | ICD-10-CM

## 2022-03-10 MED ORDER — EMGALITY 120 MG/ML ~~LOC~~ SOAJ: 240.0000 mg | Freq: Once | SUBCUTANEOUS | 0 refills | Status: AC

## 2022-03-10 MED ORDER — EMGALITY 120 MG/ML ~~LOC~~ SOAJ: 120.0000 mg | SUBCUTANEOUS | 11 refills | Status: DC

## 2022-03-10 MED ORDER — LOSARTAN POTASSIUM 25 MG PO TABS: 12.5000 mg | ORAL_TABLET | Freq: Every day | ORAL | 3 refills | Status: DC

## 2022-03-10 MED ORDER — ROSUVASTATIN CALCIUM 5 MG PO TABS: 5.0000 mg | ORAL_TABLET | Freq: Every day | ORAL | 3 refills | Status: AC

## 2022-03-10 NOTE — Progress Notes (Signed)
? ? ?PATIENT: Lisa Vega ?DOB: 1932/09/09 ? ?REASON FOR VISIT: Follow up for positional vertigo, chronic gait disorder, chronic daily headache ?HISTORY FROM: Patient, daughter  ?PRIMARY NEUROLOGIST: Dr. Jaynee Eagles  ? ?HISTORY OF PRESENT ILLNESS: ?Today 03/10/22 ?Lisa Vega here today for follow-up.  Started tizanidine at last visit for daily headache.  Is on gabapentin. Headaches continue, burning to top of head, then develops "pain" to frontal area and temples. Will lay down anywhere 5 minutes to 2 hours. Happens 2-3 days a week. The pain process is no longer than 30 minutes, doesn't take any medication. Some nausea, sensitivity to light. Claims tizanidine didn't help, made her sick. On gabapentin 100 mg 3 times daily, not sure if helping, doesn't want any higher doses. Has Tramadol from Dr. Nelva Bush if needed for orthopedic pain. Vertigo is doing well right now. The epley maneuvers hurt her neck to do routinely.lives with her daughter, Lisa Vega. Has seen cardiology, planning for stress test, HR's in the 30's. Consider cardiac cath.  ? ?HISTORY  ?09/09/2021 Dr. Jannifer Franklin: Lisa Vega is an 88 year old right-handed white female with a history of rheumatoid arthritis.  The patient has had chronic issues with positional vertigo that is activated by laying on her right side.  She we will temporarily gain benefit with vestibular rehabilitation.  She was given a sheet delineating the Epley maneuvers, but she has not been doing these on a regular basis at home.  She has a baseline chronic issue with gait instability.  She continues to have her daily headache issues with burning sensations that may migrate about the top and the sides and back of the head with occasional pressure sensation behind the eyes.  The patient may take ibuprofen at times for the headache.  She has been tried on duloxetine, gabapentin, Lyrica, and baclofen without benefit or tolerance.  The daughter who comes with her today recalls that while she  was on duloxetine that she did not complain of the headaches as much.  The patient does have some neck pain off and on but she does not clearly correlate the neck stiffness and discomfort with the headache.  She has had occasional falls, she fractured her left hip 4 months ago.  She does not use a cane or a walker when she is outside the house. ? ?REVIEW OF SYSTEMS: Out of a complete 14 system review of symptoms, the patient complains only of the following symptoms, and all other reviewed systems are negative. ? ?See HPI ? ?ALLERGIES: ?Allergies  ?Allergen Reactions  ? Codeine   ?  REACTION: hallucinations  ? Demerol   ? Escitalopram   ?  Other reaction(s): memory issues  ? Meperidine Hcl   ?  REACTION: severe GI upset  ? Meperidine Hcl   ? Other Other (See Comments)  ? Sulfa Antibiotics Rash  ? ? ?HOME MEDICATIONS: ?Outpatient Medications Prior to Visit  ?Medication Sig Dispense Refill  ? acetaminophen (TYLENOL) 325 MG tablet Take 650 mg by mouth every 6 (six) hours as needed for mild pain.    ? albuterol (PROAIR HFA) 108 (90 Base) MCG/ACT inhaler USE 1-2 PUFFS EVERY 4-6 HOURS AS NEEDED 8.5 g 5  ? ALPRAZolam (XANAX) 0.25 MG tablet TAKE 1 TABLET BY MOUTH EVERY DAY AS NEEDED FOR ANXIETY 30 tablet 2  ? amoxicillin-clavulanate (AUGMENTIN) 875-125 MG tablet Take 1 tablet by mouth 2 (two) times daily. 10 tablet 0  ? aspirin EC 81 MG tablet Take 81 mg by mouth daily.    ?  B-D TB SYRINGE 1CC/27GX1/2" 27G X 1/2" 1 ML MISC For use with methotrexate injection weekly 25 each 1  ? budesonide-formoterol (SYMBICORT) 160-4.5 MCG/ACT inhaler USE 2 INHALATIONS ORALLY   TWICE DAILY 30.6 g 2  ? clidinium-chlordiazePOXIDE (LIBRAX) 5-2.5 MG capsule SMARTSIG:1 Capsule(s) By Mouth Every 12 Hours PRN    ? doxycycline (VIBRAMYCIN) 50 MG capsule Take 50 mg by mouth daily.    ? estradiol (ESTRACE) 0.1 MG/GM vaginal cream Place vaginally.    ? famotidine (PEPCID) 40 MG tablet TAKE 1 TABLET DAILY 90 tablet 3  ? folic acid (FOLVITE) 1 MG tablet  Take 1 tablet (1 mg total) by mouth daily. 90 tablet 1  ? gabapentin (NEURONTIN) 100 MG capsule Take 1 capsule (100 mg total) by mouth 3 (three) times daily. 270 capsule 3  ? glucose blood (ONETOUCH ULTRA) test strip Use to check blood sugars twice a day 100 each 5  ? ibuprofen (ADVIL,MOTRIN) 200 MG tablet Take 200 mg by mouth every 6 (six) hours as needed for headache or mild pain.    ? losartan (COZAAR) 25 MG tablet Take 0.5 tablets (12.5 mg total) by mouth daily. 45 tablet 3  ? meclizine (ANTIVERT) 25 MG tablet Take 0.5-1 tablets (12.5-25 mg total) by mouth 3 (three) times daily as needed for dizziness. 60 tablet 5  ? Methotrexate Sodium (METHOTREXATE, PF,) 50 MG/2ML injection INJECT 0.7 MLS (17.5 MG TOTAL) INTO THE SKIN ONCE A WEEK. 4 mL 2  ? metroNIDAZOLE (METROGEL) 0.75 % gel Apply topically in the morning, at noon, and at bedtime.    ? mupirocin ointment (BACTROBAN) 2 % Place 1 application into the nose 2 (two) times daily. 22 g 1  ? omeprazole (PRILOSEC) 40 MG capsule Take 40 mg by mouth daily.    ? ONETOUCH DELICA LANCETS 42H MISC USE TO CHECK SUGARS UP TO 4 TIMES A DAY  0  ? Plecanatide (TRULANCE) 3 MG TABS Take 3 mg by mouth as needed. (Samples from GI)    ? rosuvastatin (CRESTOR) 5 MG tablet Take 1 tablet (5 mg total) by mouth daily. 90 tablet 3  ? sertraline (ZOLOFT) 100 MG tablet TAKE 1 TABLET BY MOUTH EVERY DAY 90 tablet 1  ? SPIRIVA RESPIMAT 2.5 MCG/ACT AERS USE 2 INHALATIONS ORALLY   DAILY (Patient taking differently: Inhale 2 puffs into the lungs daily as needed.) 12 g 1  ? SYNTHROID 88 MCG tablet TAKE 1 TABLET DAILY 90 tablet 1  ? traMADol (ULTRAM) 50 MG tablet Take 50 mg by mouth 3 (three) times daily as needed.    ? triamcinolone ointment (KENALOG) 0.1 % Apply 1 application topically 2 (two) times daily as needed. 30 g 3  ? Vitamin D, Cholecalciferol, 10 MCG (400 UNIT) CHEW Chew by mouth daily. Unsure of current dose    ? predniSONE (DELTASONE) 10 MG tablet Take 2 tablets (20 mg total) by mouth  daily with breakfast. (Patient not taking: Reported on 03/10/2022) 10 tablet 0  ? ?No facility-administered medications prior to visit.  ? ? ?PAST MEDICAL HISTORY: ?Past Medical History:  ?Diagnosis Date  ? Anxiety   ? Arthritis   ? RA  ? BPV (benign positional vertigo) 03/10/2018  ? Cancer [facility])   ? lung ca  ? Carotid stenosis, left 09/19/2018  ? COPD (chronic obstructive pulmonary disease) ([place])   ? Depression   ? Dyspnea   ? with exertion  ? GERD (gastroesophageal reflux disease)   ? Headache syndrome 03/09/2019  ? Hypercholesteremia   ?  Hypothyroidism   ? Memory difficulties 10/28/2015  ? Pneumonia   ?  x3  last time 2017  ? Pre-diabetes   ? Rheumatoid arthritis(714.0)   ? Thyroid disease   ? Tremor 10/28/2015  ? Jaw tremor  ? Uterine prolapse   ? Vitamin D deficiency   ? ? ?PAST SURGICAL HISTORY: ?Past Surgical History:  ?Procedure Laterality Date  ? ANGIOPLASTY Left 10/10/2018  ? Procedure: ANGIOPLASTY using 1cm x 14cm Xenosure Patch;  Surgeon: Marty Heck, MD;  Location: [place];  Service: Vascular;  Laterality: Left;  ? APPENDECTOMY    ? COLONOSCOPY W/ POLYPECTOMY    ? ENDARTERECTOMY Left 10/10/2018  ? Procedure: ENDARTERECTOMY CAROTID;  Surgeon: Marty Heck, MD;  Location: [facility] OR;  Service: Vascular;  Laterality: Left;  ? EYE SURGERY Bilateral   ? cataract  ? FOOT SURGERY Bilateral   ? arthritis  ? Hands  Bilateral   ? Arthritis  ? HEMORRHOID SURGERY    ? right lobectomy  10/2004  ? TONSILLECTOMY AND ADENOIDECTOMY    ? ? ?FAMILY HISTORY: ?Family History  ?Problem Relation Age of Onset  ? Emphysema Mother   ? Asthma Mother   ? Lung cancer Father   ? Pancreatic cancer Father   ? Bone cancer Father   ? Heart disease Brother   ?     x1  ? Diabetes Brother   ? Hypertension Brother   ? Lupus Brother   ? Heart disease Sister   ?     x2  ? Diabetes Sister   ? Hypertension Sister   ? Diabetes Sister   ? Hypertension Sister   ? Diabetes Brother   ? Hypertension Brother   ? Stroke Maternal Grandfather   ?  Hypertension Son   ? Tremor Son   ? ? ?SOCIAL HISTORY: ?Social History  ? ?Socioeconomic History  ? Marital status: Widowed  ?  Spouse name: Not on file  ? Number of children: 3  ? Years of education: L

## 2022-03-10 NOTE — Patient Instructions (Signed)
Medication Instructions:  ?START Losartan 12.5 mg (1/2 tablet) daily ?START rosuvastatin (Crestor) 5 mg daily ? ?*If you need a refill on your cardiac medications before your next appointment, please call your pharmacy* ? ? ?Lab Work: ?Please return for labs in 1 week (BMET) ? ?Our in office lab hours are Monday-Friday 8:00-4:00, closed for lunch 12:45-1:45 pm.  No appointment needed. ? ?Testing/Procedures: ?Your physician has requested that you have a lexiscan myoview. For further information please visit [email or web address]. Please follow instruction sheet, as given.  ? ?How to prepare for your Myocardial Perfusion Test: ?Do not eat or drink 3 hours prior to your test, except you may have water. ?Do not consume products containing caffeine (regular or decaffeinated) 12 hours prior to your test. (ex: coffee, chocolate, sodas, tea). ?Do bring a list of your current medications with you.  If not listed below, you may take your medications as normal. ?Do wear comfortable clothes (no dresses or overalls) and walking shoes, tennis shoes preferred (No heels or open toe shoes are allowed). ?Do NOT wear cologne, perfume, aftershave, or lotions (deodorant is allowed). ?The test will take approximately 3 to 4 hours to complete ?If these instructions are not followed, your test will have to be rescheduled. ? ?Follow-Up: ?At [facility], you and your health needs are our priority.  As part of our continuing mission to provide you with exceptional heart care, we have created designated Provider Care Teams.  These Care Teams include your primary Cardiologist (physician) and Advanced Practice Providers (APPs -  Physician Assistants and Nurse Practitioners) who all work together to provide you with the care you need, when you need it. ? ?We recommend signing up for the patient portal called "MyChart".  Sign up information is provided on this After Visit Summary.  MyChart is used to connect with patients for Virtual Visits  (Telemedicine).  Patients are able to view lab/test results, encounter notes, upcoming appointments, etc.  Non-urgent messages can be sent to your provider as well.   ?To learn more about what you can do with MyChart, go to [email or web address].   ? ?Your next appointment:   ?4-6 week(s) ? ?The format for your next appointment:   ?In Person ? ?Provider:   ?Dr. Gardiner Rhyme ? ?If primary card or EP is not listed click here to update    :1}  ? ? ?

## 2022-03-10 NOTE — Patient Instructions (Signed)
Try Emgality for headache prevention ?Start with 240 mg once a month for headaches ?Then 120 mg as maintenance dosing  ?Return back in 6 months ? ?

## 2022-03-10 NOTE — Addendum Note (Signed)
Addended by: Patria Mane A on: 03/10/2022 11:22 AM ? ? Modules accepted: Orders ? ?

## 2022-03-11 ENCOUNTER — Telehealth (HOSPITAL_COMMUNITY): Payer: Self-pay | Admitting: *Deleted

## 2022-03-11 NOTE — Telephone Encounter (Signed)
Patient given detailed instructions per Myocardial Perfusion Study Information Sheet for the test on 03/18/22 at 1000. Patient notified to arrive 15 minutes early and that it is imperative to arrive on time for appointment to keep from having the test rescheduled. ? If you need to cancel or reschedule your appointment, please call the office within 24 hours of your appointment. . Patient verbalized understanding.Brittanni Cariker, Ranae Palms ? ? ?

## 2022-03-18 ENCOUNTER — Ambulatory Visit (HOSPITAL_COMMUNITY): Payer: Medicare Other | Attending: Internal Medicine

## 2022-03-18 ENCOUNTER — Other Ambulatory Visit: Payer: Self-pay

## 2022-03-18 DIAGNOSIS — R072 Precordial pain: Secondary | ICD-10-CM | POA: Diagnosis not present

## 2022-03-18 DIAGNOSIS — I5042 Chronic combined systolic (congestive) and diastolic (congestive) heart failure: Secondary | ICD-10-CM | POA: Diagnosis not present

## 2022-03-18 LAB — MYOCARDIAL PERFUSION IMAGING
LV dias vol: 78 mL (ref 46–106)
LV sys vol: 38 mL
Nuc Stress EF: 51 %
Peak HR: 83 {beats}/min
Rest HR: 65 {beats}/min
Rest Nuclear Isotope Dose: 10.8 mCi
SDS: 2
SRS: 2
SSS: 4
ST Depression (mm): 0 mm
Stress Nuclear Isotope Dose: 30.8 mCi
TID: 1

## 2022-03-18 MED ORDER — REGADENOSON 0.4 MG/5ML IV SOLN
0.4000 mg | Freq: Once | INTRAVENOUS | Status: AC
  Administered 2022-03-18: 0.4 mg via INTRAVENOUS

## 2022-03-18 MED ORDER — TECHNETIUM TC 99M TETROFOSMIN IV KIT
10.8000 | PACK | Freq: Once | INTRAVENOUS | Status: AC | PRN
  Administered 2022-03-18: 10.8 via INTRAVENOUS
  Filled 2022-03-18: qty 11

## 2022-03-18 MED ORDER — TECHNETIUM TC 99M TETROFOSMIN IV KIT
30.8000 | PACK | Freq: Once | INTRAVENOUS | Status: AC | PRN
  Administered 2022-03-18: 30.8 via INTRAVENOUS
  Filled 2022-03-18: qty 31

## 2022-03-20 ENCOUNTER — Telehealth: Payer: Self-pay | Admitting: Primary Care

## 2022-03-20 DIAGNOSIS — Z79899 Other long term (current) drug therapy: Secondary | ICD-10-CM | POA: Diagnosis not present

## 2022-03-20 DIAGNOSIS — J449 Chronic obstructive pulmonary disease, unspecified: Secondary | ICD-10-CM

## 2022-03-20 DIAGNOSIS — I5042 Chronic combined systolic (congestive) and diastolic (congestive) heart failure: Secondary | ICD-10-CM | POA: Diagnosis not present

## 2022-03-20 NOTE — Telephone Encounter (Signed)
ONO 02/17/22 showed patient spent 5 hours 46 min with SpO2 <88%. SpO2 low 77, basal 88. Needs to continue to wear 2L oxygen at night. She should already have O2.  ?

## 2022-03-20 NOTE — Telephone Encounter (Signed)
Attempted to call pt but unable to reach. Left message for her to return call. 

## 2022-03-21 LAB — BASIC METABOLIC PANEL
BUN/Creatinine Ratio: 12 (ref 12–28)
BUN: 12 mg/dL (ref 8–27)
CO2: 24 mmol/L (ref 20–29)
Calcium: 8.7 mg/dL (ref 8.7–10.3)
Chloride: 100 mmol/L (ref 96–106)
Creatinine, Ser: 0.97 mg/dL (ref 0.57–1.00)
Glucose: 124 mg/dL — ABNORMAL HIGH (ref 70–99)
Potassium: 3.9 mmol/L (ref 3.5–5.2)
Sodium: 137 mmol/L (ref 134–144)
eGFR: 57 mL/min/{1.73_m2} — ABNORMAL LOW (ref >59)

## 2022-03-24 ENCOUNTER — Telehealth: Payer: Self-pay

## 2022-03-24 NOTE — Telephone Encounter (Signed)
Received a PA request for emgality via CMM but eligibility not found. I called [facility] [employer] FEP program. I completed PA for emgality via phone. It was approved for 7 auto injections/ 6 months from 02/22/22-09/20/22. ? ?[facility] informed. ?

## 2022-03-25 DIAGNOSIS — Z4689 Encounter for fitting and adjustment of other specified devices: Secondary | ICD-10-CM | POA: Diagnosis not present

## 2022-03-25 DIAGNOSIS — N393 Stress incontinence (female) (male): Secondary | ICD-10-CM | POA: Diagnosis not present

## 2022-03-25 DIAGNOSIS — N8110 Cystocele, unspecified: Secondary | ICD-10-CM | POA: Diagnosis not present

## 2022-03-25 DIAGNOSIS — N952 Postmenopausal atrophic vaginitis: Secondary | ICD-10-CM | POA: Diagnosis not present

## 2022-03-25 MED ORDER — ALBUTEROL SULFATE HFA 108 (90 BASE) MCG/ACT IN AERS: 2.0000 | INHALATION_SPRAY | Freq: Four times a day (QID) | RESPIRATORY_TRACT | 5 refills | Status: AC | PRN

## 2022-03-25 NOTE — Telephone Encounter (Signed)
Called and spoke with pt letting her know the info per BW and she verbalized understanding. While speaking with pt, pt stated that she needed a refill of her rescue inhaler so this has been sent to preferred pharmacy. Nothing further needed. ?

## 2022-03-25 NOTE — Telephone Encounter (Signed)
Attempted to call pt but unable to reach. Left message for her to return call. 

## 2022-04-01 ENCOUNTER — Other Ambulatory Visit: Payer: Self-pay | Admitting: Internal Medicine

## 2022-04-01 DIAGNOSIS — M069 Rheumatoid arthritis, unspecified: Secondary | ICD-10-CM

## 2022-04-01 NOTE — Telephone Encounter (Signed)
Next Visit: 04/22/2022 ? ?Last Visit: 01/20/2022 ? ?Last Fill: 10/21/2021 ? ?DX: Rheumatoid arthritis involving multiple sites, unspecified whether rheumatoid factor present  ? ?Current Dose per office note 01/20/2022: methotrexate 17.5 mg subcu weekly  ? ?Labs: 01/20/2022 Lab results look okay for continuing the methotrexate ? ?Okay to refill MTX?  ?

## 2022-04-02 ENCOUNTER — Encounter: Payer: Self-pay | Admitting: Podiatry

## 2022-04-02 ENCOUNTER — Ambulatory Visit (INDEPENDENT_AMBULATORY_CARE_PROVIDER_SITE_OTHER): Payer: Medicare Other | Admitting: Podiatry

## 2022-04-02 DIAGNOSIS — B351 Tinea unguium: Secondary | ICD-10-CM

## 2022-04-02 DIAGNOSIS — L84 Corns and callosities: Secondary | ICD-10-CM

## 2022-04-02 DIAGNOSIS — M79674 Pain in right toe(s): Secondary | ICD-10-CM | POA: Diagnosis not present

## 2022-04-02 DIAGNOSIS — E1140 Type 2 diabetes mellitus with diabetic neuropathy, unspecified: Secondary | ICD-10-CM | POA: Diagnosis not present

## 2022-04-02 DIAGNOSIS — E1149 Type 2 diabetes mellitus with other diabetic neurological complication: Secondary | ICD-10-CM

## 2022-04-02 DIAGNOSIS — M79675 Pain in left toe(s): Secondary | ICD-10-CM

## 2022-04-02 NOTE — Progress Notes (Signed)
Subjective:  ? ?Patient ID: Lisa Vega, female   DOB: 88 y.o.   MRN: [id number]  ? ?HPI ?Patient presents with severe nail disease 1-5 both feet severe foot structural issues long-term rheumatoid arthritis with ingrown component ? ? ?ROS ? ? ?   ?Objective:  ?Physical Exam  ?Neurovascular status unchanged from previous visit with thick yellow brittle nailbeds 1-5 both feet incurvated in the corners painful when pressed with severe foot structural issues bilateral and keratotic lesion underneath the third metatarsal due to bone protrusion secondary to rheumatoid arthritis with history of diabetes with diminishment of sharp dull vibratory ? ?   ?Assessment:  ?At risk patient with severe keratotic lesions of third metatarsal right mycotic nail infection 1-5 both feet with pain in the corners and severe foot structural issues ? ?   ?Plan:  ?H&P reviewed all conditions and at this point educated her on shoe gear choices debrided the plantar lesion with high risk due to long-term diabetes and debrided nailbeds 1-5 both feet with no iatrogenic bleeding noted anywhere.  Reappoint to recheck conservative care ?   ? ? ?

## 2022-04-08 ENCOUNTER — Other Ambulatory Visit: Payer: Self-pay | Admitting: Internal Medicine

## 2022-04-12 NOTE — Progress Notes (Signed)
?Cardiology Office Note:   ? ?Date:  04/17/2022  ? ?ID:  Lisa Vega, DOB 03-27-1932, MRN [id number] ? ?PCP:  Donato Heinz, MD  ?Cardiologist:  Kathlyn Sacramento, MD  ?Electrophysiologist:  None  ? ?Referring MD: Binnie Rail, MD  ? ?Chief Complaint  ?Patient presents with  ? Congestive Heart Failure  ? ? ?History of Present Illness:   ? ?Lisa Vega is a 88 y.o. female with a hx of chronic combined systolic and diastolic heart failure, lung cancer, carotid stenosis, COPD, GERD, hyperlipidemia, hypothyroidism, rheumatoid arthritis who presents for follow-up.  She was referred by Dr. Quay Burow for evaluation of bradycardia.  She reports has noted heart rate in the 30s when checks pulse ox at home.  Does feel weak when heart rate is low.  She denies any lightheadedness or syncope.  Reports has been having tightness in her lower chest.  Circles around to her back.  No clear relation with exertion, but does report she gets short of breath with minimal exertion.  She denies any lower extremity edema or palpitations.  She quit smoking 2005.  Family history includes brother died of MI in 95s, sister died of MI in 78s, father brother had multiple stents placed in his 43s. ? ?Echocardiogram 02/19/2022 showed EF 40 to 45%, global hypokinesis, mild RV dysfunction, mild to moderate TR.  Zio patch x14 days on 01/28/2022 showed occasional PVCs (3.7%), occasional PACs (1.1%), 20 episodes of SVT with longest lasting 17 beats.  Lexiscan Myoview on 03/18/2022 showed normal perfusion, EF 51%. ? ?Since last clinic visit, she reports she is doing well.  Reports occasional chest pain after eating.  Thinks it is related to her hiatal hernia.  Has chronic dyspnea which she attributes to COPD.  Reports occasional lightheadedness, denies any syncope.  Denies any lower extremity edema.  Reports occasional palpitations of short duration. ? ? ?Past Medical History:  ?Diagnosis Date  ? Anxiety   ? Arthritis   ? RA  ? BPV (benign  positional vertigo) 03/10/2018  ? Cancer [facility])   ? lung ca  ? Carotid stenosis, left 09/19/2018  ? COPD (chronic obstructive pulmonary disease) ([place])   ? Depression   ? Dyspnea   ? with exertion  ? GERD (gastroesophageal reflux disease)   ? Headache syndrome 03/09/2019  ? Hypercholesteremia   ? Hypothyroidism   ? Memory difficulties 10/28/2015  ? Pneumonia   ?  x3  last time 2017  ? Pre-diabetes   ? Rheumatoid arthritis(714.0)   ? Thyroid disease   ? Tremor 10/28/2015  ? Jaw tremor  ? Uterine prolapse   ? Vitamin D deficiency   ? ? ?Past Surgical History:  ?Procedure Laterality Date  ? ANGIOPLASTY Left 10/10/2018  ? Procedure: ANGIOPLASTY using 1cm x 14cm Xenosure Patch;  Surgeon: Marty Heck, MD;  Location: [facility];  Service: Vascular;  Laterality: Left;  ? APPENDECTOMY    ? COLONOSCOPY W/ POLYPECTOMY    ? ENDARTERECTOMY Left 10/10/2018  ? Procedure: ENDARTERECTOMY CAROTID;  Surgeon: Marty Heck, MD;  [facility];  Service: Vascular;  Laterality: Left;  ? EYE SURGERY Bilateral   ? cataract  ? FOOT SURGERY Bilateral   ? arthritis  ? Hands  Bilateral   ? Arthritis  ? HEMORRHOID SURGERY    ? right lobectomy  10/2004  ? TONSILLECTOMY AND ADENOIDECTOMY    ? ? ?Current Medications: ?Current Meds  ?Medication Sig  ? acetaminophen (TYLENOL) 325 MG tablet Take 650 mg  by mouth every 6 (six) hours as needed for mild pain.  ? albuterol (PROAIR HFA) 108 (90 Base) MCG/ACT inhaler Inhale 2 puffs into the lungs every 6 (six) hours as needed for wheezing or shortness of breath.  ? ALPRAZolam (XANAX) 0.25 MG tablet TAKE 1 TABLET BY MOUTH EVERY DAY AS NEEDED FOR ANXIETY  ? aspirin EC 81 MG tablet Take 81 mg by mouth daily.  ? B-D TB SYRINGE 1CC/27GX1/2" 27G X 1/2" 1 ML MISC For use with methotrexate injection weekly  ? budesonide-formoterol (SYMBICORT) 160-4.5 MCG/ACT inhaler USE 2 INHALATIONS ORALLY   TWICE DAILY  ? clidinium-chlordiazePOXIDE (LIBRAX) 5-2.5 MG capsule SMARTSIG:1 Capsule(s) By Mouth Every 12 Hours  PRN  ? estradiol (ESTRACE) 0.1 MG/GM vaginal cream Place vaginally.  ? famotidine (PEPCID) 40 MG tablet TAKE 1 TABLET DAILY  ? folic acid (FOLVITE) 1 MG tablet Take 1 tablet (1 mg total) by mouth daily.  ? gabapentin (NEURONTIN) 100 MG capsule Take 1 capsule (100 mg total) by mouth 3 (three) times daily.  ? Galcanezumab-gnlm (EMGALITY) 120 MG/ML SOAJ Inject 120 mg into the skin every 30 (thirty) days.  ? ibuprofen (ADVIL,MOTRIN) 200 MG tablet Take 200 mg by mouth every 6 (six) hours as needed for headache or mild pain.  ? meclizine (ANTIVERT) 25 MG tablet Take 0.5-1 tablets (12.5-25 mg total) by mouth 3 (three) times daily as needed for dizziness.  ? Methotrexate Sodium (METHOTREXATE, PF,) 50 MG/2ML injection INJECT 0.7 MLS (17.5 MG TOTAL) INTO THE SKIN ONCE A WEEK.  ? metroNIDAZOLE (METROGEL) 0.75 % gel Apply topically in the morning, at noon, and at bedtime.  ? mupirocin ointment (BACTROBAN) 2 % Place 1 application into the nose 2 (two) times daily.  ? omeprazole (PRILOSEC) 40 MG capsule Take 40 mg by mouth daily.  ? ONETOUCH DELICA LANCETS 34L MISC USE TO CHECK SUGARS UP TO 4 TIMES A DAY  ? ONETOUCH ULTRA test strip USE TO CHECK BLOOD SUGARS TWICE A DAY  ? Plecanatide (TRULANCE) 3 MG TABS Take 3 mg by mouth as needed. (Samples from GI)  ? predniSONE (DELTASONE) 10 MG tablet Take 2 tablets (20 mg total) by mouth daily with breakfast.  ? rosuvastatin (CRESTOR) 5 MG tablet Take 1 tablet (5 mg total) by mouth daily.  ? sertraline (ZOLOFT) 100 MG tablet TAKE 1 TABLET BY MOUTH EVERY DAY  ? SPIRIVA RESPIMAT 2.5 MCG/ACT AERS USE 2 INHALATIONS ORALLY   DAILY (Patient taking differently: Inhale 2 puffs into the lungs daily as needed.)  ? SYNTHROID 88 MCG tablet TAKE 1 TABLET DAILY  ? traMADol (ULTRAM) 50 MG tablet Take 50 mg by mouth 3 (three) times daily as needed.  ? triamcinolone ointment (KENALOG) 0.1 % Apply 1 application topically 2 (two) times daily as needed.  ? Vitamin D, Cholecalciferol, 10 MCG (400 UNIT) CHEW  Chew by mouth daily. Unsure of current dose  ? [DISCONTINUED] losartan (COZAAR) 25 MG tablet Take 0.5 tablets (12.5 mg total) by mouth daily.  ?  ? ?Allergies:   Codeine, Demerol, Escitalopram, Meperidine hcl, Meperidine hcl, Other, and Sulfa antibiotics  ? ?Social History  ? ?Socioeconomic History  ? Marital status: Widowed  ?  Spouse name: Not on file  ? Number of children: 3  ? Years of education: LPN  ? Highest education level: Not on file  ?Occupational History  ? Occupation: retired  ?  Employer: RETIRED  ?  Comment: LPN at [employer]  ?Tobacco Use  ? Smoking status: Former  ?  Packs/day: 1.00  ?  Years: 20.00  ?  Pack years: 20.00  ?  Types: Cigarettes  ?  Quit date: 11/10/2004  ?  Years since quitting: 17.4  ? Smokeless tobacco: Never  ?Vaping Use  ? Vaping Use: Never used  ?Substance and Sexual Activity  ? Alcohol use: Yes  ?  Alcohol/week: 1.0 standard drink  ?  Types: 1 Glasses of wine per week  ?  Comment: socially  ? Drug use: No  ? Sexual activity: Not Currently  ?Other Topics Concern  ? Not on file  ?Social History Narrative  ? 09/09/21 lives with dgtr Lovey Newcomer; widowed  ? Patient drinks about 3 cups of coffee daily.  ? Patient is right handed.   ? Retired Corporate treasurer at [employer]   ? ?Social Determinants of Health  ? ?Financial Resource Strain: Low Risk   ? Difficulty of Paying Living Expenses: Not hard at all  ?Food Insecurity: No Food Insecurity  ? Worried About Charity fundraiser in the Last Year: Never true  ? Ran Out of Food in the Last Year: Never true  ?Transportation Needs: No Transportation Needs  ? Lack of Transportation (Medical): No  ? Lack of Transportation (Non-Medical): No  ?Physical Activity: Inactive  ? Days of Exercise per Week: 0 days  ? Minutes of Exercise per Session: 0 min  ?Stress: No Stress Concern Present  ? Feeling of Stress : Not at all  ?Social Connections: Socially Isolated  ? Frequency of Communication with Friends and Family: More than three times a  week  ? Frequency of Social Gatherings with Friends and Family: Once a week  ? Attends Religious Services: Never  ? Active Member of Clubs or Organizations: No  ? Attends Archivist Meetings: Never  ? M

## 2022-04-16 ENCOUNTER — Other Ambulatory Visit: Payer: Self-pay

## 2022-04-16 DIAGNOSIS — I6523 Occlusion and stenosis of bilateral carotid arteries: Secondary | ICD-10-CM

## 2022-04-17 ENCOUNTER — Ambulatory Visit (INDEPENDENT_AMBULATORY_CARE_PROVIDER_SITE_OTHER): Payer: Medicare Other | Admitting: Cardiology

## 2022-04-17 ENCOUNTER — Encounter: Payer: Self-pay | Admitting: Cardiology

## 2022-04-17 VITALS — BP 126/72 | HR 72 | Ht 62.5 in | Wt 129.8 lb

## 2022-04-17 DIAGNOSIS — R072 Precordial pain: Secondary | ICD-10-CM

## 2022-04-17 DIAGNOSIS — I6523 Occlusion and stenosis of bilateral carotid arteries: Secondary | ICD-10-CM

## 2022-04-17 DIAGNOSIS — R001 Bradycardia, unspecified: Secondary | ICD-10-CM | POA: Diagnosis not present

## 2022-04-17 DIAGNOSIS — I5042 Chronic combined systolic (congestive) and diastolic (congestive) heart failure: Secondary | ICD-10-CM

## 2022-04-17 MED ORDER — LOSARTAN POTASSIUM 25 MG PO TABS: 25.0000 mg | ORAL_TABLET | Freq: Every day | ORAL | 2 refills | Status: AC

## 2022-04-17 NOTE — Patient Instructions (Signed)
Medication Instructions:  ?Your physician has recommended you make the following change in your medication:  ?INCREASE Losartan to 25 mg by mouth daily ? ?*If you need a refill on your cardiac medications before your next appointment, please call your pharmacy* ? ? ?Lab Work: ?Your physician recommends that you return for lab work in: In 2 weeks (BMET/CBC) ? ?If you have labs (blood work) drawn today and your tests are completely normal, you will receive your results only by: ?MyChart Message (if you have MyChart) OR ?A paper copy in the mail ?If you have any lab test that is abnormal or we need to change your treatment, we will call you to review the results. ? ? ?Testing/Procedures: ?Your physician has requested that you have a cardiac MRI. Cardiac MRI uses a computer to create images of your heart as its beating, producing both still and moving pictures of your heart and major blood vessels. For further information please visit [email or web address] Please follow the instruction sheet given to you today for more information.  ? ? ?Follow-Up: ?At [facility], you and your health needs are our priority.  As part of our continuing mission to provide you with exceptional heart care, we have created designated Provider Care Teams.  These Care Teams include your primary Cardiologist (physician) and Advanced Practice Providers (APPs -  Physician Assistants and Nurse Practitioners) who all work together to provide you with the care you need, when you need it. ? ?We recommend signing up for the patient portal called "MyChart".  Sign up information is provided on this After Visit Summary.  MyChart is used to connect with patients for Virtual Visits (Telemedicine).  Patients are able to view lab/test results, encounter notes, upcoming appointments, etc.  Non-urgent messages can be sent to your provider as well.   ?To learn more about what you can do with MyChart, go to [email or web address].   ? ?Your next appointment:    ?3 month(s) ? ?The format for your next appointment:   ?In Person ? ?Provider:   ?Dr. Oswaldo Milian ? ?If primary card or EP is not listed click here to update    :1}  ? ? ?Other Instructions ?none ? ?Important Information About Sugar ? ? ? ? ? ? ?

## 2022-04-20 DIAGNOSIS — R8290 Unspecified abnormal findings in urine: Secondary | ICD-10-CM | POA: Diagnosis not present

## 2022-04-20 DIAGNOSIS — N8110 Cystocele, unspecified: Secondary | ICD-10-CM | POA: Diagnosis not present

## 2022-04-20 DIAGNOSIS — N952 Postmenopausal atrophic vaginitis: Secondary | ICD-10-CM | POA: Diagnosis not present

## 2022-04-21 ENCOUNTER — Encounter: Payer: Self-pay | Admitting: Vascular Surgery

## 2022-04-21 ENCOUNTER — Ambulatory Visit (INDEPENDENT_AMBULATORY_CARE_PROVIDER_SITE_OTHER): Payer: Medicare Other | Admitting: Vascular Surgery

## 2022-04-21 ENCOUNTER — Ambulatory Visit (HOSPITAL_COMMUNITY)
Admission: RE | Admit: 2022-04-21 | Discharge: 2022-04-21 | Disposition: A | Discharge status: Home / Self Care | Payer: Medicare Other | Source: Ambulatory Visit | Attending: Vascular Surgery | Admitting: Vascular Surgery

## 2022-04-21 VITALS — BP 139/81 | HR 65 | Temp 97.7°F | Resp 14 | Ht 63.0 in | Wt 129.0 lb

## 2022-04-21 DIAGNOSIS — I6522 Occlusion and stenosis of left carotid artery: Secondary | ICD-10-CM

## 2022-04-21 DIAGNOSIS — I6523 Occlusion and stenosis of bilateral carotid arteries: Secondary | ICD-10-CM | POA: Insufficient documentation

## 2022-04-21 NOTE — Progress Notes (Deleted)
Office Visit Note  Patient: Lisa Vega             Date of Birth: 1932/09/19           MRN: [id number]             PCP: Binnie Rail, MD Referring: Binnie Rail, MD Visit Date: 04/22/2022   Subjective:  No chief complaint on file.   History of Present Illness: Lisa Vega is a 88 y.o. female here for follow up for seropositive RA on MTX 17.5 [place] weekly. ***   Previous HPI 01/20/22 Lisa Vega is a 88 y.o. female here for follow up for seropositive RA on MTX 17.5 mg [place] weekly and folic acid 1 mg daily.  She had recent bilateral knee steroid injections with D.r Nelva Bush which are very helpful. Her left shoulder continues to be moderately painful she takes some ibuprofen when it is very bad. She is currently wearing a heart monitor for arrhythmia. Her blood pressure is lower than normal she does feel slightly lightheaded but not particularly orthostatic symptoms no new swelling or dyspnea.   Previous HPI 10/20/21 Lisa Vega is a 88 y.o. female here for follow up for erosive RA on methotrexate 17.5 mg [place] weekly. Since our last visit she has significant ongoing joint pain but no severe or unbearable flare up. She has been seeing Dr. Nelva Bush for many years with good improvement to intraarticular injections and feels like it is about time, her left knee pain is acting up. She had at least one episode of right wrist swelling and erythema about a month ago. She has some urinary frequency problems evaluated recently for UTI this appeared to be negative, old pessary was recently removed and she is gong to see urology/gynecology new office tomorrow.    Previous HPI 12/06/20 History of Present Illness: Lisa Vega is a 88 y.o. female with a history of non-small cell lung cancer status post surgical resection and chemotherapy, hypothyroidism, osteoporosis, and COPD here to establish care for rheumatoid arthritis currently treated with methotrexate subcutaneous 17.5 mg weekly.   She was originally diagnosed around 40 years ago in [place] she to treatment with gold injections that were not effective.  She later treated with oral methotrexate that was poorly tolerated with rashes at even low-dose.  She was switched to subcutaneous methotrexate which she has been taking for at least 20 years.  Since moving to this area she is seeing rheumatology care with Dr. Justine Null, Dr. Rockwell Alexandria, Dr. Trudie Reed, and now seeks to transfer care for an office that is closer to her daughter's home where she has started living due to increased difficulties with independent living and commuting.   She has extensive long-term damage from her rheumatoid arthritis with subluxation deviation and deformity of the digits on both hands and both feet.  She is also had some involvement of the shoulders and wrists but not as severe.  She has degenerative back disease, cervical disc disease, and bilateral osteoarthritis of the knees.  The trouble with her knees especially with ulcer and deformity of the right foot significantly limit her mobility at this time.  She has not noticed a lot of joint swelling redness or heat recently and has not required recent steroid treatments.  She has had some benefit with intra-articular injections for the knees also had some injections for the neck and upper back with mixed results.  She also describes some nodules in the skin of her  upper arms and on her hands that she thinks are more noticeable over the past several months.   She has a history of non-small cell lung cancer that was found with a rapidly enlarging left lung nodule.  She was a former cigarette smoker with COPD but no longer.  She denies a known history of interstitial lung disease but has discussed difficulty say whether methotrexate has contributed a lung toxicity versus just her RA.   No Rheumatology ROS completed.   PMFS History:  Patient Active Problem List   Diagnosis Date Noted   Sinusitis 02/09/2022   Acute  respiratory failure ([place]) 01/29/2022   Pain in left shoulder 01/20/2022   Pain of left hip joint 01/12/2022   Stress incontinence 11/07/2021   Urinary frequency 10/20/2021   Arthralgia of lower leg 02/24/2021   On long term drug therapy 02/24/2021   Myalgia 02/24/2021   Anxiety state 02/24/2021   Diverticular disease of colon 02/24/2021   Dysphagia 02/24/2021   Essential tremor 02/24/2021   History of colonic polyps 02/24/2021   Mixed hyperlipidemia 02/24/2021   Vitamin B12 deficiency 02/24/2021   Vitamin D deficiency 02/24/2021   Slow transit constipation 02/24/2021   COVID-19 01/28/2021   Headache, cervicogenic 01/03/2021   High risk medication use 12/06/2020   Rosacea 11/29/2020   Cervical spondylosis 04/18/2020   Primary osteoarthritis of first carpometacarpal joint of right hand 05/10/2019   Headache syndrome 03/09/2019   Carotid stenosis, left 09/19/2018   Osteoarthritis of left knee 05/12/2018   Benign positional vertigo 03/10/2018   History of lung cancer 11/29/2017   Difficulty sleeping 09/22/2017   Diabetes ([place]) 05/13/2017   Fatigue 05/12/2017   CAP (community acquired pneumonia) 05/03/2017   Orthopnea 01/12/2017   GERD (gastroesophageal reflux disease) 11/09/2016   Osteoporosis 05/06/2016   Depression 05/06/2016   Hiatal hernia 05/06/2016   Hypothyroidism 05/06/2016   Tremor 10/28/2015   Memory difficulties 10/28/2015   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 ([place]) 05/30/2014   Diverticulitis of colon without hemorrhage 08/07/2013   Prolapse of female pelvic organs 06/22/2013   Neck pain 03/10/2013   Vaginal atrophy 03/09/2013   Rectocele 03/09/2013   Cystocele 03/09/2013   Urinary incontinence 03/09/2013   COPD (chronic obstructive pulmonary disease) ([place]) 01/16/2011   CARCINOMA, LUNG, NONSMALL CELL 12/17/2010   Rheumatoid arthritis ([place]) 12/17/2010   Dyspnea on exertion 12/17/2010    Past Medical History:  Diagnosis Date   Anxiety     Arthritis    RA   BPV (benign positional vertigo) 03/10/2018   Cancer ([place])    lung ca   Carotid stenosis, left 09/19/2018   COPD (chronic obstructive pulmonary disease) ([facility])    Depression    Dyspnea    with exertion   GERD (gastroesophageal reflux disease)    Headache syndrome 03/09/2019   Hypercholesteremia    Hypothyroidism    Memory difficulties 10/28/2015   Pneumonia     x3  last time 2017   Pre-diabetes    Rheumatoid arthritis(714.0)    Thyroid disease    Tremor 10/28/2015   Jaw tremor   Uterine prolapse    Vitamin D deficiency     Family History  Problem Relation Age of Onset   Emphysema Mother    Asthma Mother    Lung cancer Father    Pancreatic cancer Father    Bone cancer Father    Heart disease Brother        x1   Diabetes Brother    Hypertension  Brother    Lupus Brother    Heart disease Sister        x2   Diabetes Sister    Hypertension Sister    Diabetes Sister    Hypertension Sister    Diabetes Brother    Hypertension Brother    Stroke Maternal Grandfather    Hypertension Son    Tremor Son    Past Surgical History:  Procedure Laterality Date   ANGIOPLASTY Left 10/10/2018   Procedure: ANGIOPLASTY using 1cm x 14cm Xenosure Patch;  Surgeon: Marty Heck, MD;  [facility];  Service: Vascular;  Laterality: Left;   APPENDECTOMY     COLONOSCOPY W/ POLYPECTOMY     ENDARTERECTOMY Left 10/10/2018   Procedure: ENDARTERECTOMY CAROTID;  Surgeon: Marty Heck, MD;  Location: [facility] OR;  Service: Vascular;  Laterality: Left;   EYE SURGERY Bilateral    cataract   FOOT SURGERY Bilateral    arthritis   Hands  Bilateral    Arthritis   HEMORRHOID SURGERY     right lobectomy  10/2004   TONSILLECTOMY AND ADENOIDECTOMY     Social History   Social History Narrative   09/09/21 lives with dgtr Lovey Newcomer; widowed   Patient drinks about 3 cups of coffee daily.   Patient is right handed.    Retired Corporate treasurer at [employer]  History  Administered Date(s) Administered   Fluad Quad(high Dose 65+) 12/08/2019, 10/31/2020   Influenza Split 10/18/2009, 09/28/2011, 10/28/2011, 09/28/2015, 10/09/2015   Influenza Whole 09/27/2012   Influenza, High Dose Seasonal PF 10/28/2016, 01/12/2017, 09/22/2017, 10/03/2018   Influenza,inj,Quad PF,6+ Mos 09/20/2013, 09/12/2014   Pneumococcal Conjugate-13 10/24/2015, 10/31/2020   Pneumococcal Polysaccharide-23 12/29/2007, 07/10/2008     Objective: Vital Signs: There were no vitals taken for this visit.   Physical Exam   Musculoskeletal Exam: ***  CDAI Exam: CDAI Score: -- Patient Global: --; Provider Global: -- Swollen: --; Tender: -- Joint Exam 04/22/2022   No joint exam has been documented for this visit   There is currently no information documented on the homunculus. Go to the Rheumatology activity and complete the homunculus joint exam.  Investigation: No additional findings.  Imaging: VAS US CAROTID  Result Date: 04/21/2022 Carotid Arterial Duplex Study Patient Name:  Lisa Vega  Date of Exam:   04/21/2022 Medical Rec #: [id number]         Accession #:    [id number] Date of Birth: September 12, 1932         Patient Gender: F Patient Age:   64 years Exam Location:  [facility] Procedure:      VAS US CAROTID Referring Phys: Monica Martinez --------------------------------------------------------------------------------  Indications:       Carotid artery disease and left endarterectomy. Risk Factors:      Hyperlipidemia. Comparison Study:  No significant change since prior exams. Performing Technologist: Alvia Grove RVT  Examination Guidelines: A complete evaluation includes B-mode imaging, spectral Doppler, color Doppler, and power Doppler as needed of all accessible portions of each vessel. Bilateral testing is considered an integral part of a complete examination. Limited examinations for reoccurring indications may be performed as noted.  Right Carotid  Findings: +----------+-------+--------+--------+-----------------------+-----------------+           PSV    EDV cm/sStenosisPlaque Description     Comments                    cm/s                                                            +----------+-------+--------+--------+-----------------------+-----------------+  CCA Prox  67     16                                                       +----------+-------+--------+--------+-----------------------+-----------------+ CCA Mid   54     17                                                       +----------+-------+--------+--------+-----------------------+-----------------+ CCA Distal61     15              heterogenous                             +----------+-------+--------+--------+-----------------------+-----------------+ ICA Prox  146    45      40-59%  heterogenous and       slight tortuosity                                  calcific                                 +----------+-------+--------+--------+-----------------------+-----------------+ ICA Mid   117    40                                                       +----------+-------+--------+--------+-----------------------+-----------------+ ICA Distal113    30                                                       +----------+-------+--------+--------+-----------------------+-----------------+ ECA       111    16                                                       +----------+-------+--------+--------+-----------------------+-----------------+ +----------+--------+-------+----------------+-------------------+           PSV cm/sEDV cmsDescribe        Arm Pressure (mmHG) +----------+--------+-------+----------------+-------------------+ [id number]     4      Multiphasic, WNL                    +----------+--------+-------+----------------+-------------------+ +---------+--------+--+--------+--+---------+ VertebralPSV  cm/s35EDV cm/s10Antegrade +---------+--------+--+--------+--+---------+  Left Carotid Findings: +----------+--------+--------+--------+------------------+--------+           PSV cm/sEDV cm/sStenosisPlaque DescriptionComments +----------+--------+--------+--------+------------------+--------+ CCA Prox  67      15                                         +----------+--------+--------+--------+------------------+--------+ CCA Mid  53      15                                         +----------+--------+--------+--------+------------------+--------+ CCA Distal55      18              homogeneous                +----------+--------+--------+--------+------------------+--------+ ICA Prox  78      29      1-39%   heterogenous               +----------+--------+--------+--------+------------------+--------+ ICA Mid   99      30                                         +----------+--------+--------+--------+------------------+--------+ ICA Distal83      24                                         +----------+--------+--------+--------+------------------+--------+ ECA       86      15                                         +----------+--------+--------+--------+------------------+--------+ +----------+--------+--------+----------------+-------------------+           PSV cm/sEDV cm/sDescribe        Arm Pressure (mmHG) +----------+--------+--------+----------------+-------------------+ Subclavian120     4       Multiphasic, WNL                    +----------+--------+--------+----------------+-------------------+ +---------+--------+--+--------+-+---------+ VertebralPSV cm/s47EDV cm/s8Antegrade +---------+--------+--+--------+-+---------+   Summary: Right Carotid: Velocities in the right ICA are consistent with a 40-59%                stenosis, lower end of range. Left Carotid: Patent carotid endarterectomy with velocity in the left ICA                consistent with a 1-39% stenosis. Vertebrals:  Bilateral vertebral arteries demonstrate antegrade flow. Subclavians: Normal flow hemodynamics were seen in bilateral subclavian              arteries. *See table(s) above for measurements and observations.  Electronically signed by Monica Martinez MD on 04/21/2022 at 4:16:32 PM.    Final     Recent Labs: Lab Results  Component Value Date   WBC 9.3 01/20/2022   HGB 13.6 01/20/2022   PLT 200 01/20/2022   NA 137 03/20/2022   K 3.9 03/20/2022   CL 100 03/20/2022   CO2 24 03/20/2022   GLUCOSE 124 (H) 03/20/2022   BUN 12 03/20/2022   CREATININE 0.97 03/20/2022   BILITOT 0.8 01/20/2022   ALKPHOS 87 12/02/2021   AST 30 01/20/2022   ALT 31 (H) 01/20/2022   PROT 6.9 01/20/2022   ALBUMIN 3.7 12/02/2021   CALCIUM 8.7 03/20/2022   GFRAA 63 06/25/2021    Speciality Comments: No specialty comments available.  Procedures:  No procedures performed Allergies: Codeine, Demerol, Escitalopram, Meperidine hcl, Meperidine hcl, Other, and Sulfa antibiotics   Assessment / Plan:  Visit Diagnoses: No diagnosis found.  ***  Orders: No orders of the defined types were placed in this encounter.  No orders of the defined types were placed in this encounter.    Follow-Up Instructions: No follow-ups on file.   Collier Salina, MD  Note - This record has been created using [employer].  Chart creation errors have been sought, but may not always  have been located. Such creation errors do not reflect on  the standard of medical care.

## 2022-04-21 NOTE — Progress Notes (Signed)
? ?Patient name: Lisa Vega MRN: [id number] DOB: 1932-04-14 Sex: female ? ?REASON FOR VISIT: 1 year follow-up for surveillance of carotid artery disease ? ?HPI: ?Lisa Vega is a 88 y.o. female who presents for 1 year follow-up and ongoing surveillance of her carotid artery disease.  She had a left carotid endarterectomy with patch angioplasty on 09/30/2018 for symptomatic high-grade stenosis.   She has had no new neurologic events since we saw her last year.  She denies any focal weakness or numbness in her arm or leg or vision loss.  Overall she feels she is doing very well.  She does complain of headaches on both sides including the back of her neck and gets some pain when she turns her head to the side. ? ?Past Medical History:  ?Diagnosis Date  ? Anxiety   ? Arthritis   ? RA  ? BPV (benign positional vertigo) 03/10/2018  ? Cancer [facility])   ? lung ca  ? Carotid stenosis, left 09/19/2018  ? COPD (chronic obstructive pulmonary disease) ([place])   ? Depression   ? Dyspnea   ? with exertion  ? GERD (gastroesophageal reflux disease)   ? Headache syndrome 03/09/2019  ? Hypercholesteremia   ? Hypothyroidism   ? Memory difficulties 10/28/2015  ? Pneumonia   ?  x3  last time 2017  ? Pre-diabetes   ? Rheumatoid arthritis(714.0)   ? Thyroid disease   ? Tremor 10/28/2015  ? Jaw tremor  ? Uterine prolapse   ? Vitamin D deficiency   ? ? ?Past Surgical History:  ?Procedure Laterality Date  ? ANGIOPLASTY Left 10/10/2018  ? Procedure: ANGIOPLASTY using 1cm x 14cm Xenosure Patch;  Surgeon: Marty Heck, MD;  Location: [place];  Service: Vascular;  Laterality: Left;  ? APPENDECTOMY    ? COLONOSCOPY W/ POLYPECTOMY    ? ENDARTERECTOMY Left 10/10/2018  ? Procedure: ENDARTERECTOMY CAROTID;  Surgeon: Marty Heck, MD;  Location: [facility] OR;  Service: Vascular;  Laterality: Left;  ? EYE SURGERY Bilateral   ? cataract  ? FOOT SURGERY Bilateral   ? arthritis  ? Hands  Bilateral   ? Arthritis  ? HEMORRHOID SURGERY    ? right  lobectomy  10/2004  ? TONSILLECTOMY AND ADENOIDECTOMY    ? ? ?Family History  ?Problem Relation Age of Onset  ? Emphysema Mother   ? Asthma Mother   ? Lung cancer Father   ? Pancreatic cancer Father   ? Bone cancer Father   ? Heart disease Brother   ?     x1  ? Diabetes Brother   ? Hypertension Brother   ? Lupus Brother   ? Heart disease Sister   ?     x2  ? Diabetes Sister   ? Hypertension Sister   ? Diabetes Sister   ? Hypertension Sister   ? Diabetes Brother   ? Hypertension Brother   ? Stroke Maternal Grandfather   ? Hypertension Son   ? Tremor Son   ? ? ?SOCIAL HISTORY: ?Social History  ? ?Tobacco Use  ? Smoking status: Former  ?  Packs/day: 1.00  ?  Years: 20.00  ?  Pack years: 20.00  ?  Types: Cigarettes  ?  Quit date: 11/10/2004  ?  Years since quitting: 17.4  ? Smokeless tobacco: Never  ?Substance Use Topics  ? Alcohol use: Yes  ?  Alcohol/week: 1.0 standard drink  ?  Types: 1 Glasses of wine per week  ?  Comment:  socially  ? ? ?Allergies  ?Allergen Reactions  ? Codeine   ?  REACTION: hallucinations  ? Demerol   ? Escitalopram   ?  Other reaction(s): memory issues  ? Meperidine Hcl   ?  REACTION: severe GI upset  ? Meperidine Hcl   ? Other Other (See Comments)  ? Sulfa Antibiotics Rash  ? ? ?Current Outpatient Medications  ?Medication Sig Dispense Refill  ? acetaminophen (TYLENOL) 325 MG tablet Take 650 mg by mouth every 6 (six) hours as needed for mild pain.    ? albuterol (PROAIR HFA) 108 (90 Base) MCG/ACT inhaler Inhale 2 puffs into the lungs every 6 (six) hours as needed for wheezing or shortness of breath. 8.5 g 5  ? ALPRAZolam (XANAX) 0.25 MG tablet TAKE 1 TABLET BY MOUTH EVERY DAY AS NEEDED FOR ANXIETY 30 tablet 2  ? aspirin EC 81 MG tablet Take 81 mg by mouth daily.    ? B-D TB SYRINGE 1CC/27GX1/2" 27G X 1/2" 1 ML MISC For use with methotrexate injection weekly 25 each 1  ? budesonide-formoterol (SYMBICORT) 160-4.5 MCG/ACT inhaler USE 2 INHALATIONS ORALLY   TWICE DAILY 30.6 g 2  ?  clidinium-chlordiazePOXIDE (LIBRAX) 5-2.5 MG capsule SMARTSIG:1 Capsule(s) By Mouth Every 12 Hours PRN    ? estradiol (ESTRACE) 0.1 MG/GM vaginal cream Place vaginally.    ? famotidine (PEPCID) 40 MG tablet TAKE 1 TABLET DAILY 90 tablet 3  ? folic acid (FOLVITE) 1 MG tablet Take 1 tablet (1 mg total) by mouth daily. 90 tablet 1  ? gabapentin (NEURONTIN) 100 MG capsule Take 1 capsule (100 mg total) by mouth 3 (three) times daily. 270 capsule 3  ? Galcanezumab-gnlm (EMGALITY) 120 MG/ML SOAJ Inject 120 mg into the skin every 30 (thirty) days. 1.12 mL 11  ? ibuprofen (ADVIL,MOTRIN) 200 MG tablet Take 200 mg by mouth every 6 (six) hours as needed for headache or mild pain.    ? losartan (COZAAR) 25 MG tablet Take 1 tablet (25 mg total) by mouth daily. 90 tablet 2  ? meclizine (ANTIVERT) 25 MG tablet Take 0.5-1 tablets (12.5-25 mg total) by mouth 3 (three) times daily as needed for dizziness. 60 tablet 5  ? Methotrexate Sodium (METHOTREXATE, PF,) 50 MG/2ML injection INJECT 0.7 MLS (17.5 MG TOTAL) INTO THE SKIN ONCE A WEEK. 4 mL 2  ? metroNIDAZOLE (METROGEL) 0.75 % gel Apply topically in the morning, at noon, and at bedtime.    ? mupirocin ointment (BACTROBAN) 2 % Place 1 application into the nose 2 (two) times daily. 22 g 1  ? omeprazole (PRILOSEC) 40 MG capsule Take 40 mg by mouth daily.    ? ONETOUCH DELICA LANCETS 97D MISC USE TO CHECK SUGARS UP TO 4 TIMES A DAY  0  ? ONETOUCH ULTRA test strip USE TO CHECK BLOOD SUGARS TWICE A DAY 100 strip 5  ? Plecanatide (TRULANCE) 3 MG TABS Take 3 mg by mouth as needed. (Samples from GI)    ? rosuvastatin (CRESTOR) 5 MG tablet Take 1 tablet (5 mg total) by mouth daily. 90 tablet 3  ? sertraline (ZOLOFT) 100 MG tablet TAKE 1 TABLET BY MOUTH EVERY DAY 90 tablet 1  ? SPIRIVA RESPIMAT 2.5 MCG/ACT AERS USE 2 INHALATIONS ORALLY   DAILY (Patient taking differently: Inhale 2 puffs into the lungs daily as needed.) 12 g 1  ? SYNTHROID 88 MCG tablet TAKE 1 TABLET DAILY 90 tablet 1  ? traMADol  (ULTRAM) 50 MG tablet Take 50 mg by mouth  3 (three) times daily as needed.    ? triamcinolone ointment (KENALOG) 0.1 % Apply 1 application topically 2 (two) times daily as needed. 30 g 3  ? Vitamin D, Cholecalciferol, 10 MCG (400 UNIT) CHEW Chew by mouth daily. Unsure of current dose    ? amoxicillin-clavulanate (AUGMENTIN) 875-125 MG tablet Take 1 tablet by mouth 2 (two) times daily. (Patient not taking: Reported on 04/17/2022) 10 tablet 0  ? doxycycline (VIBRAMYCIN) 50 MG capsule Take 50 mg by mouth daily. (Patient not taking: Reported on 04/17/2022)    ? predniSONE (DELTASONE) 10 MG tablet Take 2 tablets (20 mg total) by mouth daily with breakfast. (Patient not taking: Reported on 04/21/2022) 10 tablet 0  ? ?No current facility-administered medications for this visit.  ? ? ?REVIEW OF SYSTEMS:  ?[X]  denotes positive finding, [ ]  denotes negative finding ?Cardiac  Comments:  ?Chest pain or chest pressure:    ?Shortness of breath upon exertion:    ?Short of breath when lying flat:    ?Irregular heart rhythm:    ?    ?Vascular    ?Pain in calf, thigh, or hip brought on by ambulation:    ?Pain in feet at night that wakes you up from your sleep:     ?Blood clot in your veins:    ?Leg swelling:     ?    ?Pulmonary    ?Oxygen at home:    ?Productive cough:     ?Wheezing:     ?    ?Neurologic    ?Sudden weakness in arms or legs:     ?Sudden numbness in arms or legs:     ?Sudden onset of difficulty speaking or slurred speech:    ?Temporary loss of vision in one eye:     ?Problems with dizziness:     ?    ?Gastrointestinal    ?Blood in stool:     ?Vomited blood:     ?    ?Genitourinary    ?Burning when urinating:     ?Blood in urine:    ?    ?Psychiatric    ?Major depression:     ?    ?Hematologic    ?Bleeding problems:    ?Problems with blood clotting too easily:    ?    ?Skin    ?Rashes or ulcers:    ?    ?Constitutional    ?Fever or chills:    ? ? ?PHYSICAL EXAM: ?Vitals:  ? 04/21/22 1557 04/21/22 1601  ?BP: (!) 145/84  139/81  ?Pulse: 65 65  ?Resp: 14   ?Temp: 97.7 ?F (36.5 ?C)   ?TempSrc: Temporal   ?SpO2: 92%   ?Weight: 129 lb (58.5 kg)   ?Height: 5\' 3"  (1.6 m)   ? ? ?GENERAL: The patient is a well-nourished female, in no acute distress.

## 2022-04-22 ENCOUNTER — Ambulatory Visit: Payer: Medicare Other | Admitting: Internal Medicine

## 2022-04-29 ENCOUNTER — Other Ambulatory Visit: Payer: Self-pay | Admitting: Pulmonary Disease

## 2022-05-05 ENCOUNTER — Ambulatory Visit (INDEPENDENT_AMBULATORY_CARE_PROVIDER_SITE_OTHER): Payer: Medicare Other | Admitting: Pulmonary Disease

## 2022-05-05 ENCOUNTER — Encounter: Payer: Self-pay | Admitting: Pulmonary Disease

## 2022-05-05 VITALS — BP 120/78 | HR 62 | Temp 98.2°F | Ht 62.0 in | Wt 130.0 lb

## 2022-05-05 DIAGNOSIS — J449 Chronic obstructive pulmonary disease, unspecified: Secondary | ICD-10-CM | POA: Diagnosis not present

## 2022-05-05 DIAGNOSIS — G4734 Idiopathic sleep related nonobstructive alveolar hypoventilation: Secondary | ICD-10-CM

## 2022-05-05 NOTE — Progress Notes (Signed)
? ?      Lisa Vega    [id number]    04-07-1932 ? ?Primary Care Physician:Burns, [name], MD ? ?Referring Physician: Binnie Rail, MD ?[place],  [place] [address] ? ?Chief complaint:  ?Patient with a history of COPD ? ?HPI: ?No recent exacerbation ? ?She is on Spiriva and Symbicort which she is compliant with ? ?Rarely needs albuterol ? ?No recent cough ? ?She has not been using her oxygen at night ?-Does not feel she needs it ?-Stated she is only used 1 night when she felt short of breath ? ?I did try to convince her that she may not necessarily feel anything but we have documentation that her oxygen is running low most of the night ? ?Had a CT scan September 2021 -reviewed, unchanged from previous ? ?Breathing has been relatively stable ?She is not having to restrict activities ? ?She does not perform any graded exercises on a regular basis, tries to walk regularly ? ? ?Outpatient Encounter Medications as of 05/05/2022  ?Medication Sig  ?? acetaminophen (TYLENOL) 325 MG tablet Take 650 mg by mouth every 6 (six) hours as needed for mild pain.  ?? albuterol (PROAIR HFA) 108 (90 Base) MCG/ACT inhaler Inhale 2 puffs into the lungs every 6 (six) hours as needed for wheezing or shortness of breath.  ?? ALPRAZolam (XANAX) 0.25 MG tablet TAKE 1 TABLET BY MOUTH EVERY DAY AS NEEDED FOR ANXIETY  ?? aspirin EC 81 MG tablet Take 81 mg by mouth daily.  ?? B-D TB SYRINGE 1CC/27GX1/2" 27G X 1/2" 1 ML MISC For use with methotrexate injection weekly  ?? budesonide-formoterol (SYMBICORT) 160-4.5 MCG/ACT inhaler USE 2 INHALATIONS ORALLY   TWICE DAILY  ?? clidinium-chlordiazePOXIDE (LIBRAX) 5-2.5 MG capsule SMARTSIG:1 Capsule(s) By Mouth Every 12 Hours PRN  ?? doxycycline (VIBRAMYCIN) 50 MG capsule Take 50 mg by mouth daily.  ?? estradiol (ESTRACE) 0.1 MG/GM vaginal cream Place vaginally.  ?? famotidine (PEPCID) 40 MG tablet TAKE 1 TABLET DAILY  ?? folic acid (FOLVITE) 1 MG tablet Take 1 tablet (1 mg total)  by mouth daily.  ?? gabapentin (NEURONTIN) 100 MG capsule Take 1 capsule (100 mg total) by mouth 3 (three) times daily.  ?? Galcanezumab-gnlm (EMGALITY) 120 MG/ML SOAJ Inject 120 mg into the skin every 30 (thirty) days.  ?? ibuprofen (ADVIL,MOTRIN) 200 MG tablet Take 200 mg by mouth every 6 (six) hours as needed for headache or mild pain.  ?? losartan (COZAAR) 25 MG tablet Take 1 tablet (25 mg total) by mouth daily.  ?? meclizine (ANTIVERT) 25 MG tablet Take 0.5-1 tablets (12.5-25 mg total) by mouth 3 (three) times daily as needed for dizziness.  ?? Methotrexate Sodium (METHOTREXATE, PF,) 50 MG/2ML injection INJECT 0.7 MLS (17.5 MG TOTAL) INTO THE SKIN ONCE A WEEK.  ?? metroNIDAZOLE (METROGEL) 0.75 % gel Apply topically in the morning, at noon, and at bedtime.  ?? mupirocin ointment (BACTROBAN) 2 % Place 1 application into the nose 2 (two) times daily.  ?? omeprazole (PRILOSEC) 40 MG capsule Take 40 mg by mouth daily.  ?? ONETOUCH DELICA LANCETS 23F MISC USE TO CHECK SUGARS UP TO 4 TIMES A DAY  ?? ONETOUCH ULTRA test strip USE TO CHECK BLOOD SUGARS TWICE A DAY  ?? Plecanatide (TRULANCE) 3 MG TABS Take 3 mg by mouth as needed. (Samples from GI)  ?? predniSONE (DELTASONE) 10 MG tablet Take 2 tablets (20 mg total) by mouth daily with breakfast. (Patient taking differently: Take 20  mg by mouth as needed.)  ?? rosuvastatin (CRESTOR) 5 MG tablet Take 1 tablet (5 mg total) by mouth daily.  ?? sertraline (ZOLOFT) 100 MG tablet TAKE 1 TABLET BY MOUTH EVERY DAY  ?? SPIRIVA RESPIMAT 2.5 MCG/ACT AERS USE 2 INHALATIONS ORALLY   DAILY (Patient taking differently: Inhale 2 puffs into the lungs daily as needed.)  ?? SYNTHROID 88 MCG tablet TAKE 1 TABLET DAILY  ?? traMADol (ULTRAM) 50 MG tablet Take 50 mg by mouth 3 (three) times daily as needed.  ?? triamcinolone ointment (KENALOG) 0.1 % Apply 1 application topically 2 (two) times daily as needed.  ?? Vitamin D, Cholecalciferol, 10 MCG (400 UNIT) CHEW Chew by mouth daily. Unsure of  current dose  ?? [DISCONTINUED] amoxicillin-clavulanate (AUGMENTIN) 875-125 MG tablet Take 1 tablet by mouth 2 (two) times daily. (Patient not taking: Reported on 05/05/2022)  ? ?No facility-administered encounter medications on file as of 05/05/2022.  ? ? ?Allergies as of 05/05/2022 - Review Complete 05/05/2022  ?Allergen Reaction Noted  ?? Codeine Other (See Comments)   ?? Meperidine hcl Nausea And Vomiting   ?? Demerol Other (See Comments) 08/10/2011  ?? Escitalopram Other (See Comments) 01/21/2022  ?? Meperidine hcl Other (See Comments) 05/02/2018  ?? Other Other (See Comments) 10/04/2020  ?? Sulfa antibiotics Rash 10/04/2020  ? ? ?Past Medical History:  ?Diagnosis Date  ?? Anxiety   ?? Arthritis   ? RA  ?? BPV (benign positional vertigo) 03/10/2018  ?? Cancer [facility])   ? lung ca  ?? Carotid stenosis, left 09/19/2018  ?? COPD (chronic obstructive pulmonary disease) ([place])   ?? Depression   ?? Dyspnea   ? with exertion  ?? GERD (gastroesophageal reflux disease)   ?? Headache syndrome 03/09/2019  ?? Hypercholesteremia   ?? Hypothyroidism   ?? Memory difficulties 10/28/2015  ?? Pneumonia   ?  x3  last time 2017  ?? Pre-diabetes   ?? Rheumatoid arthritis(714.0)   ?? Thyroid disease   ?? Tremor 10/28/2015  ? Jaw tremor  ?? Uterine prolapse   ?? Vitamin D deficiency   ? ? ?Past Surgical History:  ?Procedure Laterality Date  ?? ANGIOPLASTY Left 10/10/2018  ? Procedure: ANGIOPLASTY using 1cm x 14cm Xenosure Patch;  Surgeon: Marty Heck, MD;  Location: [place];  Service: Vascular;  Laterality: Left;  ?? APPENDECTOMY    ?? COLONOSCOPY W/ POLYPECTOMY    ?? ENDARTERECTOMY Left 10/10/2018  ? Procedure: ENDARTERECTOMY CAROTID;  Surgeon: Marty Heck, MD;  Location: [facility] OR;  Service: Vascular;  Laterality: Left;  ?? EYE SURGERY Bilateral   ? cataract  ?? FOOT SURGERY Bilateral   ? arthritis  ?? Hands  Bilateral   ? Arthritis  ?? HEMORRHOID SURGERY    ?? right lobectomy  10/2004  ?? TONSILLECTOMY AND ADENOIDECTOMY     ? ? ?Family History  ?Problem Relation Age of Onset  ?? Emphysema Mother   ?? Asthma Mother   ?? Lung cancer Father   ?? Pancreatic cancer Father   ?? Bone cancer Father   ?? Heart disease Brother   ?     x1  ?? Diabetes Brother   ?? Hypertension Brother   ?? Lupus Brother   ?? Heart disease Sister   ?     x2  ?? Diabetes Sister   ?? Hypertension Sister   ?? Diabetes Sister   ?? Hypertension Sister   ?? Diabetes Brother   ?? Hypertension Brother   ?? Stroke Maternal Grandfather   ??  Hypertension Son   ?? Tremor Son   ? ? ?Social History  ? ?Socioeconomic History  ?? Marital status: Widowed  ?  Spouse name: Not on file  ?? Number of children: 3  ?? Years of education: LPN  ?? Highest education level: Not on file  ?Occupational History  ?? Occupation: retired  ?  Employer: RETIRED  ?  Comment: LPN at [employer]  ?Tobacco Use  ?? Smoking status: Former  ?  Packs/day: 1.00  ?  Years: 20.00  ?  Pack years: 20.00  ?  Types: Cigarettes  ?  Quit date: 11/10/2004  ?  Years since quitting: 17.4  ?? Smokeless tobacco: Never  ?Vaping Use  ?? Vaping Use: Never used  ?Substance and Sexual Activity  ?? Alcohol use: Yes  ?  Alcohol/week: 1.0 standard drink  ?  Types: 1 Glasses of wine per week  ?  Comment: socially  ?? Drug use: No  ?? Sexual activity: Not Currently  ?Other Topics Concern  ?? Not on file  ?Social History Narrative  ? 09/09/21 lives with dgtr Lovey Newcomer; widowed  ? Patient drinks about 3 cups of coffee daily.  ? Patient is right handed.   ? Retired Corporate treasurer at [employer]   ? ?Social Determinants of Health  ? ?Financial Resource Strain: Low Risk   ?? Difficulty of Paying Living Expenses: Not hard at all  ?Food Insecurity: No Food Insecurity  ?? Worried About Charity fundraiser in the Last Year: Never true  ?? Ran Out of Food in the Last Year: Never true  ?Transportation Needs: No Transportation Needs  ?? Lack of Transportation (Medical): No  ?? Lack of Transportation (Non-Medical): No  ?Physical  Activity: Inactive  ?? Days of Exercise per Week: 0 days  ?? Minutes of Exercise per Session: 0 min  ?Stress: No Stress Concern Present  ?? Feeling of Stress : Not at all  ?Social Connections: Midwife

## 2022-05-05 NOTE — Patient Instructions (Addendum)
Chronic obstructive pulmonary disease ?-Continue inhalers ? ?Need for oxygen at night ?-2 L of oxygen to be used at night ?-It helps, so that we are not putting too much stress on the heart at night ? ?Call with significant concerns ? ? ?I will see you in 6 months ? ?

## 2022-05-06 DIAGNOSIS — N3020 Other chronic cystitis without hematuria: Secondary | ICD-10-CM | POA: Diagnosis not present

## 2022-05-06 DIAGNOSIS — N390 Urinary tract infection, site not specified: Secondary | ICD-10-CM | POA: Diagnosis not present

## 2022-05-06 DIAGNOSIS — N3000 Acute cystitis without hematuria: Secondary | ICD-10-CM | POA: Diagnosis not present

## 2022-05-06 DIAGNOSIS — B3732 Chronic candidiasis of vulva and vagina: Secondary | ICD-10-CM | POA: Diagnosis not present

## 2022-05-13 DIAGNOSIS — H18513 Endothelial corneal dystrophy, bilateral: Secondary | ICD-10-CM | POA: Diagnosis not present

## 2022-05-13 DIAGNOSIS — L718 Other rosacea: Secondary | ICD-10-CM | POA: Diagnosis not present

## 2022-05-13 DIAGNOSIS — Z961 Presence of intraocular lens: Secondary | ICD-10-CM | POA: Diagnosis not present

## 2022-05-13 DIAGNOSIS — H02403 Unspecified ptosis of bilateral eyelids: Secondary | ICD-10-CM | POA: Diagnosis not present

## 2022-05-14 ENCOUNTER — Other Ambulatory Visit (HOSPITAL_COMMUNITY): Payer: Medicare Other

## 2022-05-18 ENCOUNTER — Telehealth (HOSPITAL_COMMUNITY): Payer: Self-pay | Admitting: *Deleted

## 2022-05-18 NOTE — Telephone Encounter (Signed)
Attempted to call patient regarding upcoming cardiac MRI appointment and reminder for patient to obtain labs. Left message on voicemail with name and callback number  Gordy Clement RN Navigator Cardiac [facility] [phone] Office [phone] Cell

## 2022-05-18 NOTE — Telephone Encounter (Signed)
Reaching out to patient to offer assistance regarding upcoming cardiac imaging study; pt verbalizes understanding of appt date/time, parking situation and where to check in, medications ordered; name and call back number provided for further questions should they arise  Gordy Clement RN Navigator Cardiac [facility] [phone] office [phone] cell  Patient reports claustrophobia and was encouraged to take her Xanax in preparation for the MRI. She is aware to obtain labs prior.

## 2022-05-19 DIAGNOSIS — R001 Bradycardia, unspecified: Secondary | ICD-10-CM | POA: Diagnosis not present

## 2022-05-19 DIAGNOSIS — R072 Precordial pain: Secondary | ICD-10-CM | POA: Diagnosis not present

## 2022-05-19 DIAGNOSIS — I5042 Chronic combined systolic (congestive) and diastolic (congestive) heart failure: Secondary | ICD-10-CM | POA: Diagnosis not present

## 2022-05-19 DIAGNOSIS — I6523 Occlusion and stenosis of bilateral carotid arteries: Secondary | ICD-10-CM | POA: Diagnosis not present

## 2022-05-20 ENCOUNTER — Ambulatory Visit: Payer: Medicare Other | Admitting: Nurse Practitioner

## 2022-05-20 ENCOUNTER — Ambulatory Visit (HOSPITAL_COMMUNITY)
Admission: RE | Admit: 2022-05-20 | Discharge: 2022-05-20 | Disposition: A | Discharge status: Home / Self Care | Payer: Medicare Other | Source: Ambulatory Visit | Attending: Cardiology | Admitting: Cardiology

## 2022-05-20 DIAGNOSIS — I6523 Occlusion and stenosis of bilateral carotid arteries: Secondary | ICD-10-CM

## 2022-05-20 DIAGNOSIS — R072 Precordial pain: Secondary | ICD-10-CM | POA: Diagnosis not present

## 2022-05-20 DIAGNOSIS — I5042 Chronic combined systolic (congestive) and diastolic (congestive) heart failure: Secondary | ICD-10-CM | POA: Insufficient documentation

## 2022-05-20 DIAGNOSIS — R001 Bradycardia, unspecified: Secondary | ICD-10-CM | POA: Insufficient documentation

## 2022-05-20 LAB — CBC WITH DIFFERENTIAL/PLATELET
Basophils Absolute: 0.1 10*3/uL (ref 0.0–0.2)
Basos: 2 %
EOS (ABSOLUTE): 0.1 10*3/uL (ref 0.0–0.4)
Eos: 2 %
Hematocrit: 37.7 % (ref 34.0–46.6)
Hemoglobin: 12.1 g/dL (ref 11.1–15.9)
Immature Grans (Abs): 0 10*3/uL (ref 0.0–0.1)
Immature Granulocytes: 0 %
Lymphocytes Absolute: 3.1 10*3/uL (ref 0.7–3.1)
Lymphs: 58 %
MCH: 30.4 pg (ref 26.6–33.0)
MCHC: 32.1 g/dL (ref 31.5–35.7)
MCV: 95 fL (ref 79–97)
Monocytes Absolute: 0.2 10*3/uL (ref 0.1–0.9)
Monocytes: 4 %
Neutrophils Absolute: 1.8 10*3/uL (ref 1.4–7.0)
Neutrophils: 34 %
Platelets: 148 10*3/uL — ABNORMAL LOW (ref 150–450)
RBC: 3.98 x10E6/uL (ref 3.77–5.28)
RDW: 16.2 % — ABNORMAL HIGH (ref 11.7–15.4)
WBC: 5.4 10*3/uL (ref 3.4–10.8)

## 2022-05-20 LAB — BASIC METABOLIC PANEL
BUN/Creatinine Ratio: 9 — ABNORMAL LOW (ref 12–28)
BUN: 9 mg/dL (ref 8–27)
CO2: 28 mmol/L (ref 20–29)
Calcium: 9.1 mg/dL (ref 8.7–10.3)
Chloride: 105 mmol/L (ref 96–106)
Creatinine, Ser: 0.95 mg/dL (ref 0.57–1.00)
Glucose: 99 mg/dL (ref 70–99)
Potassium: 4 mmol/L (ref 3.5–5.2)
Sodium: 144 mmol/L (ref 134–144)
eGFR: 58 mL/min/{1.73_m2} — ABNORMAL LOW (ref >59)

## 2022-05-20 MED ORDER — GADOBUTROL 1 MMOL/ML IV SOLN
6.0000 mL | Freq: Once | INTRAVENOUS | Status: AC | PRN
  Administered 2022-05-20: 6 mL via INTRAVENOUS

## 2022-05-22 ENCOUNTER — Encounter: Payer: Self-pay | Admitting: *Deleted

## 2022-05-26 NOTE — Progress Notes (Deleted)
Office Visit Note  Patient: Lisa Vega             Date of Birth: 02-03-1932           MRN: [id number]             PCP: Binnie Rail, MD Referring: Binnie Rail, MD Visit Date: 06/02/2022   Subjective:  No chief complaint on file.   History of Present Illness: Lisa Vega is a 88 y.o. female here for follow up for seropositive RA on MTX 17.5 mg [place] weekly and folic acid 1 mg daily.    Previous HPI 01/20/2022 Lisa Vega is a 88 y.o. female here for follow up for seropositive RA on MTX 17.5 mg [place] weekly and folic acid 1 mg daily.  She had recent bilateral knee steroid injections with D.r Nelva Bush which are very helpful. Her left shoulder continues to be moderately painful she takes some ibuprofen when it is very bad. She is currently wearing a heart monitor for arrhythmia. Her blood pressure is lower than normal she does feel slightly lightheaded but not particularly orthostatic symptoms no new swelling or dyspnea.   Previous HPI 10/20/21 Lisa Vega is a 88 y.o. female here for follow up for erosive RA on methotrexate 17.5 mg [place] weekly. Since our last visit she has significant ongoing joint pain but no severe or unbearable flare up. She has been seeing Dr. Nelva Bush for many years with good improvement to intraarticular injections and feels like it is about time, her left knee pain is acting up. She had at least one episode of right wrist swelling and erythema about a month ago. She has some urinary frequency problems evaluated recently for UTI this appeared to be negative, old pessary was recently removed and she is gong to see urology/gynecology new office tomorrow.    Previous HPI 12/06/20 History of Present Illness: Lisa Vega is a 88 y.o. female with a history of non-small cell lung cancer status post surgical resection and chemotherapy, hypothyroidism, osteoporosis, and COPD here to establish care for rheumatoid arthritis currently treated with methotrexate  subcutaneous 17.5 mg weekly.  She was originally diagnosed around 40 years ago in [place] she to treatment with gold injections that were not effective.  She later treated with oral methotrexate that was poorly tolerated with rashes at even low-dose.  She was switched to subcutaneous methotrexate which she has been taking for at least 20 years.  Since moving to this area she is seeing rheumatology care with Dr. Justine Null, Dr. Rockwell [place], Dr. Trudie Reed, and now seeks to transfer care for an office that is closer to her daughter's home where she has started living due to increased difficulties with independent living and commuting.   She has extensive long-term damage from her rheumatoid arthritis with subluxation deviation and deformity of the digits on both hands and both feet.  She is also had some involvement of the shoulders and wrists but not as severe.  She has degenerative back disease, cervical disc disease, and bilateral osteoarthritis of the knees.  The trouble with her knees especially with ulcer and deformity of the right foot significantly limit her mobility at this time.  She has not noticed a lot of joint swelling redness or heat recently and has not required recent steroid treatments.  She has had some benefit with intra-articular injections for the knees also had some injections for the neck and upper back with mixed results.  She also describes  some nodules in the skin of her upper arms and on her hands that she thinks are more noticeable over the past several months.   She has a history of non-small cell lung cancer that was found with a rapidly enlarging left lung nodule.  She was a former cigarette smoker with COPD but no longer.  She denies a known history of interstitial lung disease but has discussed difficulty say whether methotrexate has contributed a lung toxicity versus just her RA.   No Rheumatology ROS completed.   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/24/2021   Dysphagia 02/24/2021   Essential tremor 02/24/2021   History of colonic polyps 02/24/2021   Mixed hyperlipidemia 02/24/2021   Vitamin B12 deficiency 02/24/2021   Vitamin D deficiency 02/24/2021   Slow transit constipation 02/24/2021   COVID-19 01/28/2021   Headache, cervicogenic 01/03/2021   High risk medication use 12/06/2020   Rosacea 11/29/2020   Cervical spondylosis 04/18/2020   Primary osteoarthritis of first carpometacarpal joint of right hand 05/10/2019   Headache syndrome 03/09/2019   Carotid stenosis, left 09/19/2018   Osteoarthritis of left knee 05/12/2018   Benign positional vertigo 03/10/2018   History of lung cancer 11/29/2017   Difficulty sleeping 09/22/2017   Diabetes ([place]) 05/13/2017   Fatigue 05/12/2017   CAP (community acquired pneumonia) 05/03/2017   Orthopnea 01/12/2017   GERD (gastroesophageal reflux disease) 11/09/2016   Osteoporosis 05/06/2016   Depression 05/06/2016   Hiatal hernia 05/06/2016   Hypothyroidism 05/06/2016   Tremor 10/28/2015   Memory difficulties 10/28/2015   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 ([place]) 05/30/2014   Diverticulitis of colon without hemorrhage 08/07/2013   Prolapse of female pelvic organs 06/22/2013   Neck pain 03/10/2013   Vaginal atrophy 03/09/2013   Rectocele 03/09/2013   Cystocele 03/09/2013   Urinary incontinence 03/09/2013   COPD (chronic obstructive pulmonary disease) ([place]) 01/16/2011   CARCINOMA, LUNG, NONSMALL CELL 12/17/2010   Rheumatoid arthritis ([place]) 12/17/2010   Dyspnea on exertion 12/17/2010    Past Medical History:   Diagnosis Date   Anxiety    Arthritis    RA   BPV (benign positional vertigo) 03/10/2018   Cancer ([place])    lung ca   Carotid stenosis, left 09/19/2018   COPD (chronic obstructive pulmonary disease) ([facility])    Depression    Dyspnea    with exertion   GERD (gastroesophageal reflux disease)    Headache syndrome 03/09/2019   Hypercholesteremia    Hypothyroidism    Memory difficulties 10/28/2015   Pneumonia     x3  last time 2017   Pre-diabetes    Rheumatoid arthritis(714.0)    Thyroid disease    Tremor 10/28/2015   Jaw tremor   Uterine prolapse    Vitamin D deficiency     Family History  Problem Relation Age of Onset   Emphysema Mother    Asthma Mother    Lung cancer Father    Pancreatic cancer Father    Bone cancer Father    Heart disease Brother        x1  Diabetes Brother    Hypertension Brother    Lupus Brother    Heart disease Sister        x2   Diabetes Sister    Hypertension Sister    Diabetes Sister    Hypertension Sister    Diabetes Brother    Hypertension Brother    Stroke Maternal Grandfather    Hypertension Son    Tremor Son    Past Surgical History:  Procedure Laterality Date   ANGIOPLASTY Left 10/10/2018   Procedure: ANGIOPLASTY using 1cm x 14cm Xenosure Patch;  Surgeon: Marty Heck, MD;  Location: [facility] OR;  Service: Vascular;  Laterality: Left;   APPENDECTOMY     COLONOSCOPY W/ POLYPECTOMY     ENDARTERECTOMY Left 10/10/2018   Procedure: ENDARTERECTOMY CAROTID;  Surgeon: Marty Heck, MD;  Location: [facility] OR;  Service: Vascular;  Laterality: Left;   EYE SURGERY Bilateral    cataract   FOOT SURGERY Bilateral    arthritis   Hands  Bilateral    Arthritis   HEMORRHOID SURGERY     right lobectomy  10/2004   TONSILLECTOMY AND ADENOIDECTOMY     Social History   Social History Narrative   09/09/21 lives with dgtr Lovey Newcomer; widowed   Patient drinks about 3 cups of coffee daily.   Patient is right handed.    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   Physical Exam   Musculoskeletal Exam: ***  CDAI Exam: CDAI Score: -- Patient Global: --; Provider Global: -- Swollen: --; Tender: -- Joint Exam 06/02/2022   No joint exam has been documented for this visit   There is currently no information documented on the homunculus. Go to the Rheumatology activity and complete the homunculus joint exam.  Investigation: No additional findings.  Imaging: MR CARDIAC MORPHOLOGY W WO CONTRAST  Result Date: 05/22/2022 CLINICAL DATA:  45F with HFrEF (EF 40-45% on echo). No ischemia on Lexiscan Myoview EXAM: CARDIAC MRI TECHNIQUE: The patient was scanned on a 1.5 Tesla Siemens magnet. A dedicated cardiac coil was used. Functional imaging was done using Fiesta sequences. 2,3, and 4 chamber views were done to assess for RWMA's. Modified Simpson's rule using a short axis stack was used to calculate an ejection fraction on a dedicated work Conservation officer, nature. The patient received 6 cc of Gadavist. After 10 minutes inversion recovery sequences were used to assess for infiltration and scar tissue. CONTRAST:  6 cc  of Gadavist FINDINGS: Left ventricle: -Mild hypertrophy -Normal size -Mild systolic dysfunction -Normal ECV (27%) -No LGE LV EF:  43% (Normal 56-78%) Absolute volumes: LV EDV: 169mL (Normal 52-141 mL) LV ESV: 97mL (Normal 13-51 mL) LV SV: 33mL (Normal 33-97 mL) CO: 3.6L/min (Normal 2.7-6.0 L/min)  Indexed volumes: LV EDV: 69mL/sq-m (Normal 41-81 mL/sq-m) LV ESV: 88mL/sq-m (Normal 12-21 mL/sq-m) LV SV: 88mL/sq-m (Normal 26-56 mL/sq-m) CI: 2.3L/min/sq-m (Normal 1.8-3.8 L/min/sq-m) Right ventricle: Normal size and systolic function RV EF: 45% (Normal 47-80%) Absolute volumes: RV EDV: 130mL (Normal 58-154 mL) RV ESV: 83mL (Normal 12-68 mL) RV SV: 1mL (Normal  35-98 mL) CO: 3.8L/min (Normal 2.7-6 L/min) Indexed volumes: RV EDV: 1mL/sq-m (Normal 48-87 mL/sq-m) RV ESV: 46mL/sq-m (Normal 11-28 mL/sq-m) RV SV: 17mL/sq-m (Normal 27-57 mL/sq-m) CI: 2.4L/min/sq-m (Normal 1.8-3.8 L/min/sq-m) Left atrium: Mild enlargement Right atrium: Normal size Mitral valve: No regurgitation Aortic valve: No regurgitation Tricuspid valve: Mild regurgitation Pulmonic valve: No regurgitation Aorta: Normal proximal ascending aorta Pericardium: Normal IMPRESSION: 1. Normal LV size, mild hypertrophy, and mild systolic dysfunction (EF 15%) 2.  Normal RV size and systolic function (EF 40%) 3.  No late gadolinium enhancement to suggest myocardial scar Electronically Signed   By: Oswaldo Milian M.D.   On: 05/22/2022 13:23    Recent Labs: Lab Results  Component Value Date   WBC 5.4 05/19/2022   HGB 12.1 05/19/2022   PLT 148 (L) 05/19/2022   NA 144 05/19/2022   K 4.0 05/19/2022   CL 105 05/19/2022   CO2 28 05/19/2022   GLUCOSE 99 05/19/2022   BUN 9 05/19/2022   CREATININE 0.95 05/19/2022   BILITOT 0.8 01/20/2022   ALKPHOS 87 12/02/2021   AST 30 01/20/2022   ALT 31 (H) 01/20/2022   PROT 6.9 01/20/2022   ALBUMIN 3.7 12/02/2021   CALCIUM 9.1 05/19/2022   GFRAA 63 06/25/2021    Speciality Comments: No specialty comments available.  Procedures:  No procedures performed Allergies: Codeine, Meperidine hcl, Demerol, Escitalopram, Meperidine hcl, Other, and Sulfa antibiotics   Assessment / Plan:     Visit Diagnoses: No diagnosis found.  ***  Orders: No orders of the defined types were placed in this  encounter.  No orders of the defined types were placed in this encounter.    Follow-Up Instructions: No follow-ups on file.   Earnestine Mealing, CMA  Note - This record has been created using Editor, commissioning.  Chart creation errors have been sought, but may not always  have been located. Such creation errors do not reflect on  the standard of medical care.

## 2022-05-28 NOTE — Progress Notes (Deleted)
Office Visit Note  Patient: Lisa Vega             Date of Birth: 06-Jun-1932           MRN: [id number]             PCP: Binnie Rail, MD Referring: Binnie Rail, MD Visit Date: 06/10/2022   Subjective:  No chief complaint on file.   History of Present Illness: Lisa Vega is a 88 y.o. female here for follow up for seropositive RA on MTX 17.5 mg [place] weekly and folic acid 1 mg daily.   Previous HPI 01/20/2022 Lisa Vega is a 88 y.o. female here for follow up for seropositive RA on MTX 17.5 mg [place] weekly and folic acid 1 mg daily.  She had recent bilateral knee steroid injections with D.r Nelva Bush which are very helpful. Her left shoulder continues to be moderately painful she takes some ibuprofen when it is very bad. She is currently wearing a heart monitor for arrhythmia. Her blood pressure is lower than normal she does feel slightly lightheaded but not particularly orthostatic symptoms no new swelling or dyspnea.   Previous HPI 10/20/21 Lisa Vega is a 88 y.o. female here for follow up for erosive RA on methotrexate 17.5 mg [place] weekly. Since our last visit she has significant ongoing joint pain but no severe or unbearable flare up. She has been seeing Dr. Nelva Bush for many years with good improvement to intraarticular injections and feels like it is about time, her left knee pain is acting up. She had at least one episode of right wrist swelling and erythema about a month ago. She has some urinary frequency problems evaluated recently for UTI this appeared to be negative, old pessary was recently removed and she is gong to see urology/gynecology new office tomorrow.    Previous HPI 12/06/20 History of Present Illness: Lisa Vega is a 88 y.o. female with a history of non-small cell lung cancer status post surgical resection and chemotherapy, hypothyroidism, osteoporosis, and COPD here to establish care for rheumatoid arthritis currently treated with methotrexate  subcutaneous 17.5 mg weekly.  She was originally diagnosed around 40 years ago in [place] she to treatment with gold injections that were not effective.  She later treated with oral methotrexate that was poorly tolerated with rashes at even low-dose.  She was switched to subcutaneous methotrexate which she has been taking for at least 20 years.  Since moving to this area she is seeing rheumatology care with Dr. Justine Null, Dr. Rockwell [place], Dr. Trudie Reed, and now seeks to transfer care for an office that is closer to her daughter's home where she has started living due to increased difficulties with independent living and commuting.   She has extensive long-term damage from her rheumatoid arthritis with subluxation deviation and deformity of the digits on both hands and both feet.  She is also had some involvement of the shoulders and wrists but not as severe.  She has degenerative back disease, cervical disc disease, and bilateral osteoarthritis of the knees.  The trouble with her knees especially with ulcer and deformity of the right foot significantly limit her mobility at this time.  She has not noticed a lot of joint swelling redness or heat recently and has not required recent steroid treatments.  She has had some benefit with intra-articular injections for the knees also had some injections for the neck and upper back with mixed results.  She also describes some  nodules in the skin of her upper arms and on her hands that she thinks are more noticeable over the past several months.   She has a history of non-small cell lung cancer that was found with a rapidly enlarging left lung nodule.  She was a former cigarette smoker with COPD but no longer.  She denies a known history of interstitial lung disease but has discussed difficulty say whether methotrexate has contributed a lung toxicity versus just her RA.   No Rheumatology ROS completed.   PMFS History:  Patient Active Problem List   Diagnosis Date Noted    Sinusitis 02/09/2022   Acute respiratory failure ([place]) 01/29/2022   Pain in left shoulder 01/20/2022   Pain of left hip joint 01/12/2022   Stress incontinence 11/07/2021   Urinary frequency 10/20/2021   Arthralgia of lower leg 02/24/2021   On long term drug therapy 02/24/2021   Myalgia 02/24/2021   Anxiety state 02/24/2021   Diverticular disease of colon 02/24/2021   Dysphagia 02/24/2021   Essential tremor 02/24/2021   History of colonic polyps 02/24/2021   Mixed hyperlipidemia 02/24/2021   Vitamin B12 deficiency 02/24/2021   Vitamin D deficiency 02/24/2021   Slow transit constipation 02/24/2021   COVID-19 01/28/2021   Headache, cervicogenic 01/03/2021   High risk medication use 12/06/2020   Rosacea 11/29/2020   Cervical spondylosis 04/18/2020   Primary osteoarthritis of first carpometacarpal joint of right hand 05/10/2019   Headache syndrome 03/09/2019   Carotid stenosis, left 09/19/2018   Osteoarthritis of left knee 05/12/2018   Benign positional vertigo 03/10/2018   History of lung cancer 11/29/2017   Difficulty sleeping 09/22/2017   Diabetes ([place]) 05/13/2017   Fatigue 05/12/2017   CAP (community acquired pneumonia) 05/03/2017   Orthopnea 01/12/2017   GERD (gastroesophageal reflux disease) 11/09/2016   Osteoporosis 05/06/2016   Depression 05/06/2016   Hiatal hernia 05/06/2016   Hypothyroidism 05/06/2016   Tremor 10/28/2015   Memory difficulties 10/28/2015   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 ([place]) 05/30/2014   Diverticulitis of colon without hemorrhage 08/07/2013   Prolapse of female pelvic organs 06/22/2013   Neck pain 03/10/2013   Vaginal atrophy 03/09/2013   Rectocele 03/09/2013   Cystocele 03/09/2013   Urinary incontinence 03/09/2013   COPD (chronic obstructive pulmonary disease) ([place]) 01/16/2011   CARCINOMA, LUNG, NONSMALL CELL 12/17/2010   Rheumatoid arthritis ([place]) 12/17/2010   Dyspnea on exertion 12/17/2010    Past Medical History:   Diagnosis Date   Anxiety    Arthritis    RA   BPV (benign positional vertigo) 03/10/2018   Cancer ([place])    lung ca   Carotid stenosis, left 09/19/2018   COPD (chronic obstructive pulmonary disease) ([facility])    Depression    Dyspnea    with exertion   GERD (gastroesophageal reflux disease)    Headache syndrome 03/09/2019   Hypercholesteremia    Hypothyroidism    Memory difficulties 10/28/2015   Pneumonia     x3  last time 2017   Pre-diabetes    Rheumatoid arthritis(714.0)    Thyroid disease    Tremor 10/28/2015   Jaw tremor   Uterine prolapse    Vitamin D deficiency     Family History  Problem Relation Age of Onset   Emphysema Mother    Asthma Mother    Lung cancer Father    Pancreatic cancer Father    Bone cancer Father    Heart disease Brother        x1  Diabetes Brother    Hypertension Brother    Lupus Brother    Heart disease Sister        x2   Diabetes Sister    Hypertension Sister    Diabetes Sister    Hypertension Sister    Diabetes Brother    Hypertension Brother    Stroke Maternal Grandfather    Hypertension Son    Tremor Son    Past Surgical History:  Procedure Laterality Date   ANGIOPLASTY Left 10/10/2018   Procedure: ANGIOPLASTY using 1cm x 14cm Xenosure Patch;  Surgeon: Marty Heck, MD;  Location: [facility] OR;  Service: Vascular;  Laterality: Left;   APPENDECTOMY     COLONOSCOPY W/ POLYPECTOMY     ENDARTERECTOMY Left 10/10/2018   Procedure: ENDARTERECTOMY CAROTID;  Surgeon: Marty Heck, MD;  Location: [facility] OR;  Service: Vascular;  Laterality: Left;   EYE SURGERY Bilateral    cataract   FOOT SURGERY Bilateral    arthritis   Hands  Bilateral    Arthritis   HEMORRHOID SURGERY     right lobectomy  10/2004   TONSILLECTOMY AND ADENOIDECTOMY     Social History   Social History Narrative   09/09/21 lives with dgtr Lovey Newcomer; widowed   Patient drinks about 3 cups of coffee daily.   Patient is right handed.    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   Physical Exam   Musculoskeletal Exam: ***  CDAI Exam: CDAI Score: -- Patient Global: --; Provider Global: -- Swollen: --; Tender: -- Joint Exam 06/10/2022   No joint exam has been documented for this visit   There is currently no information documented on the homunculus. Go to the Rheumatology activity and complete the homunculus joint exam.  Investigation: No additional findings.  Imaging: MR CARDIAC MORPHOLOGY W WO CONTRAST  Result Date: 05/22/2022 CLINICAL DATA:  24F with HFrEF (EF 40-45% on echo). No ischemia on Lexiscan Myoview EXAM: CARDIAC MRI TECHNIQUE: The patient was scanned on a 1.5 Tesla Siemens magnet. A dedicated cardiac coil was used. Functional imaging was done using Fiesta sequences. 2,3, and 4 chamber views were done to assess for RWMA's. Modified Simpson's rule using a short axis stack was used to calculate an ejection fraction on a dedicated work Conservation officer, nature. The patient received 6 cc of Gadavist. After 10 minutes inversion recovery sequences were used to assess for infiltration and scar tissue. CONTRAST:  6 cc  of Gadavist FINDINGS: Left ventricle: -Mild hypertrophy -Normal size -Mild systolic dysfunction -Normal ECV (27%) -No LGE LV EF:  43% (Normal 56-78%) Absolute volumes: LV EDV: 171mL (Normal 52-141 mL) LV ESV: 46mL (Normal 13-51 mL) LV SV: 24mL (Normal 33-97 mL) CO: 3.6L/min (Normal 2.7-6.0 L/min)  Indexed volumes: LV EDV: 25mL/sq-m (Normal 41-81 mL/sq-m) LV ESV: 48mL/sq-m (Normal 12-21 mL/sq-m) LV SV: 60mL/sq-m (Normal 26-56 mL/sq-m) CI: 2.3L/min/sq-m (Normal 1.8-3.8 L/min/sq-m) Right ventricle: Normal size and systolic function RV EF: 29% (Normal 47-80%) Absolute volumes: RV EDV: 172mL (Normal 58-154 mL) RV ESV: 44mL (Normal 12-68 mL) RV SV: 62mL (Normal  35-98 mL) CO: 3.8L/min (Normal 2.7-6 L/min) Indexed volumes: RV EDV: 78mL/sq-m (Normal 48-87 mL/sq-m) RV ESV: 71mL/sq-m (Normal 11-28 mL/sq-m) RV SV: 35mL/sq-m (Normal 27-57 mL/sq-m) CI: 2.4L/min/sq-m (Normal 1.8-3.8 L/min/sq-m) Left atrium: Mild enlargement Right atrium: Normal size Mitral valve: No regurgitation Aortic valve: No regurgitation Tricuspid valve: Mild regurgitation Pulmonic valve: No regurgitation Aorta: Normal proximal ascending aorta Pericardium: Normal IMPRESSION: 1. Normal LV size, mild hypertrophy, and mild systolic dysfunction (EF 12%) 2.  Normal RV size and systolic function (EF 75%) 3.  No late gadolinium enhancement to suggest myocardial scar Electronically Signed   By: Oswaldo Milian M.D.   On: 05/22/2022 13:23    Recent Labs: Lab Results  Component Value Date   WBC 5.4 05/19/2022   HGB 12.1 05/19/2022   PLT 148 (L) 05/19/2022   NA 144 05/19/2022   K 4.0 05/19/2022   CL 105 05/19/2022   CO2 28 05/19/2022   GLUCOSE 99 05/19/2022   BUN 9 05/19/2022   CREATININE 0.95 05/19/2022   BILITOT 0.8 01/20/2022   ALKPHOS 87 12/02/2021   AST 30 01/20/2022   ALT 31 (H) 01/20/2022   PROT 6.9 01/20/2022   ALBUMIN 3.7 12/02/2021   CALCIUM 9.1 05/19/2022   GFRAA 63 06/25/2021    Speciality Comments: No specialty comments available.  Procedures:  No procedures performed Allergies: Codeine, Meperidine hcl, Demerol, Escitalopram, Meperidine hcl, Other, and Sulfa antibiotics   Assessment / Plan:     Visit Diagnoses: No diagnosis found.  ***  Orders: No orders of the defined types were placed in this  encounter.  No orders of the defined types were placed in this encounter.    Follow-Up Instructions: No follow-ups on file.   Earnestine Mealing, CMA  Note - This record has been created using Editor, commissioning.  Chart creation errors have been sought, but may not always  have been located. Such creation errors do not reflect on  the standard of medical care.

## 2022-06-02 ENCOUNTER — Encounter: Payer: Self-pay | Admitting: Internal Medicine

## 2022-06-02 ENCOUNTER — Ambulatory Visit: Payer: Medicare Other | Admitting: Internal Medicine

## 2022-06-02 DIAGNOSIS — M1712 Unilateral primary osteoarthritis, left knee: Secondary | ICD-10-CM

## 2022-06-02 DIAGNOSIS — M25512 Pain in left shoulder: Secondary | ICD-10-CM

## 2022-06-02 DIAGNOSIS — M069 Rheumatoid arthritis, unspecified: Secondary | ICD-10-CM

## 2022-06-02 DIAGNOSIS — Z79899 Other long term (current) drug therapy: Secondary | ICD-10-CM

## 2022-06-02 NOTE — Patient Instructions (Addendum)
     Blood work was ordered.     Medications changes include :      Your prescription(s) have been sent to your pharmacy.    A referral was ordered for XX.     Someone from that office will call you to schedule an appointment.    Return in about 6 months (around 12/03/2022) for follow up.

## 2022-06-02 NOTE — Progress Notes (Signed)
Subjective:    Patient ID: Lisa Vega, female    DOB: 09-06-1932, 88 y.o.   MRN: [id number]     HPI Lisa Vega is here for follow up of her chronic medical problems, including DM, hypothyroid, GERD, anxiety, depression    Medications and allergies reviewed with patient and updated if appropriate.  Current Outpatient Medications on File Prior to Visit  Medication Sig Dispense Refill   acetaminophen (TYLENOL) 325 MG tablet Take 650 mg by mouth every 6 (six) hours as needed for mild pain.     albuterol (PROAIR HFA) 108 (90 Base) MCG/ACT inhaler Inhale 2 puffs into the lungs every 6 (six) hours as needed for wheezing or shortness of breath. 8.5 g 5   ALPRAZolam (XANAX) 0.25 MG tablet TAKE 1 TABLET BY MOUTH EVERY DAY AS NEEDED FOR ANXIETY 30 tablet 2   aspirin EC 81 MG tablet Take 81 mg by mouth daily.     B-D TB SYRINGE 1CC/27GX1/2" 27G X 1/2" 1 ML MISC For use with methotrexate injection weekly 25 each 1   budesonide-formoterol (SYMBICORT) 160-4.5 MCG/ACT inhaler USE 2 INHALATIONS ORALLY   TWICE DAILY 30.6 g 2   clidinium-chlordiazePOXIDE (LIBRAX) 5-2.5 MG capsule SMARTSIG:1 Capsule(s) By Mouth Every 12 Hours PRN     doxycycline (VIBRAMYCIN) 50 MG capsule Take 50 mg by mouth daily.     estradiol (ESTRACE) 0.1 MG/GM vaginal cream Place vaginally.     famotidine (PEPCID) 40 MG tablet TAKE 1 TABLET DAILY 90 tablet 3   folic acid (FOLVITE) 1 MG tablet Take 1 tablet (1 mg total) by mouth daily. 90 tablet 1   gabapentin (NEURONTIN) 100 MG capsule Take 1 capsule (100 mg total) by mouth 3 (three) times daily. 270 capsule 3   Galcanezumab-gnlm (EMGALITY) 120 MG/ML SOAJ Inject 120 mg into the skin every 30 (thirty) days. 1.12 mL 11   ibuprofen (ADVIL,MOTRIN) 200 MG tablet Take 200 mg by mouth every 6 (six) hours as needed for headache or mild pain.     losartan (COZAAR) 25 MG tablet Take 1 tablet (25 mg total) by mouth daily. 90 tablet 2   meclizine (ANTIVERT) 25 MG tablet Take 0.5-1 tablets  (12.5-25 mg total) by mouth 3 (three) times daily as needed for dizziness. 60 tablet 5   Methotrexate Sodium (METHOTREXATE, PF,) 50 MG/2ML injection INJECT 0.7 MLS (17.5 MG TOTAL) INTO THE SKIN ONCE A WEEK. 4 mL 2   metroNIDAZOLE (METROGEL) 0.75 % gel Apply topically in the morning, at noon, and at bedtime.     mupirocin ointment (BACTROBAN) 2 % Place 1 application into the nose 2 (two) times daily. 22 g 1   omeprazole (PRILOSEC) 40 MG capsule Take 40 mg by mouth daily.     ONETOUCH DELICA LANCETS 14G MISC USE TO CHECK SUGARS UP TO 4 TIMES A DAY  0   ONETOUCH ULTRA test strip USE TO CHECK BLOOD SUGARS TWICE A DAY 100 strip 5   Plecanatide (TRULANCE) 3 MG TABS Take 3 mg by mouth as needed. (Samples from GI)     predniSONE (DELTASONE) 10 MG tablet Take 2 tablets (20 mg total) by mouth daily with breakfast. (Patient taking differently: Take 20 mg by mouth as needed.) 10 tablet 0   rosuvastatin (CRESTOR) 5 MG tablet Take 1 tablet (5 mg total) by mouth daily. 90 tablet 3   sertraline (ZOLOFT) 100 MG tablet TAKE 1 TABLET BY MOUTH EVERY DAY 90 tablet 1   SPIRIVA RESPIMAT 2.5 MCG/ACT  AERS USE 2 INHALATIONS ORALLY   DAILY (Patient taking differently: Inhale 2 puffs into the lungs daily as needed.) 12 g 1   SYNTHROID 88 MCG tablet TAKE 1 TABLET DAILY 90 tablet 1   traMADol (ULTRAM) 50 MG tablet Take 50 mg by mouth 3 (three) times daily as needed.     triamcinolone ointment (KENALOG) 0.1 % Apply 1 application topically 2 (two) times daily as needed. 30 g 3   Vitamin D, Cholecalciferol, 10 MCG (400 UNIT) CHEW Chew by mouth daily. Unsure of current dose     No current facility-administered medications on file prior to visit.     Review of Systems     Objective:  There were no vitals filed for this visit. BP Readings from Last 3 Encounters:  05/05/22 120/78  04/21/22 139/81  04/17/22 126/72   Wt Readings from Last 3 Encounters:  05/05/22 130 lb (59 kg)  04/21/22 129 lb (58.5 kg)  04/17/22 129 lb  12.8 oz (58.9 kg)   There is no height or weight on file to calculate BMI.    Physical Exam     Lab Results  Component Value Date   WBC 5.4 05/19/2022   HGB 12.1 05/19/2022   HCT 37.7 05/19/2022   PLT 148 (L) 05/19/2022   GLUCOSE 99 05/19/2022   CHOL 195 06/02/2021   TRIG 124.0 06/02/2021   HDL 58.30 06/02/2021   LDLCALC 112 (H) 06/02/2021   ALT 31 (H) 01/20/2022   AST 30 01/20/2022   NA 144 05/19/2022   K 4.0 05/19/2022   CL 105 05/19/2022   CREATININE 0.95 05/19/2022   BUN 9 05/19/2022   CO2 28 05/19/2022   TSH 0.72 12/02/2021   INR 1.08 10/06/2018   HGBA1C 5.9 12/02/2021   MICROALBUR 2.2 (H) 06/02/2021     Assessment & Plan:    See Problem List for Assessment and Plan of chronic medical problems.   This encounter was created in error - please disregard.

## 2022-06-03 ENCOUNTER — Encounter: Payer: Medicare Other | Admitting: Internal Medicine

## 2022-06-03 DIAGNOSIS — F411 Generalized anxiety disorder: Secondary | ICD-10-CM

## 2022-06-03 DIAGNOSIS — F3289 Other specified depressive episodes: Secondary | ICD-10-CM

## 2022-06-03 DIAGNOSIS — E038 Other specified hypothyroidism: Secondary | ICD-10-CM

## 2022-06-03 DIAGNOSIS — E119 Type 2 diabetes mellitus without complications: Secondary | ICD-10-CM

## 2022-06-03 DIAGNOSIS — K219 Gastro-esophageal reflux disease without esophagitis: Secondary | ICD-10-CM

## 2022-06-03 NOTE — Assessment & Plan Note (Signed)
Chronic  Lab Results  Component Value Date   HGBA1C 5.9 12/02/2021   Sugars very well controlled Testing sugars 0-1 times a day Check A1c Continue lifestyle control Stressed regular exercise as tolerated/keeping active, diabetic diet

## 2022-06-03 NOTE — Assessment & Plan Note (Signed)
Chronic Controlled, Stable Continue sertraline 100 mg daily 0.25 mg daily as needed

## 2022-06-03 NOTE — Assessment & Plan Note (Signed)
Chronic Controlled, Stable Continue sertraline 100 mg daily

## 2022-06-03 NOTE — Assessment & Plan Note (Signed)
Chronic GERD controlled Continue famotidine 40 mg daily, omeprazole 40 mg daily

## 2022-06-03 NOTE — Assessment & Plan Note (Signed)
Chronic  Clinically euthyroid Currently taking Synthroid 88 mcg daily Check tsh  Titrate med dose if needed

## 2022-06-08 ENCOUNTER — Ambulatory Visit: Payer: Medicare Other | Admitting: Internal Medicine

## 2022-06-09 ENCOUNTER — Encounter: Payer: Self-pay | Admitting: Internal Medicine

## 2022-06-09 NOTE — Progress Notes (Unsigned)
Subjective:    Patient ID: Lisa Vega, female    DOB: [birth year], 88 y.o.   MRN: [id number]     HPI Lisa Vega is here for follow up of her chronic medical problems, including DM, hypothyroid, GERD, anxiety, depression  Sugars well controlled at home - she checks it about once a week.    The past two nights she had sweats.  Her clothes   Medications and allergies reviewed with patient and updated if appropriate.  Current Outpatient Medications on File Prior to Visit  Medication Sig Dispense Refill   acetaminophen (TYLENOL) 325 MG tablet Take 650 mg by mouth every 6 (six) hours as needed for mild pain.     albuterol (PROAIR HFA) 108 (90 Base) MCG/ACT inhaler Inhale 2 puffs into the lungs every 6 (six) hours as needed for wheezing or shortness of breath. 8.5 g 5   ALPRAZolam (XANAX) 0.25 MG tablet TAKE 1 TABLET BY MOUTH EVERY DAY AS NEEDED FOR ANXIETY 30 tablet 2   aspirin EC 81 MG tablet Take 81 mg by mouth daily.     B-D TB SYRINGE 1CC/27GX1/2" 27G X 1/2" 1 ML MISC For use with methotrexate injection weekly 25 each 1   budesonide-formoterol (SYMBICORT) 160-4.5 MCG/ACT inhaler USE 2 INHALATIONS ORALLY   TWICE DAILY 30.6 g 2   clidinium-chlordiazePOXIDE (LIBRAX) 5-2.5 MG capsule SMARTSIG:1 Capsule(s) By Mouth Every 12 Hours PRN     doxycycline (VIBRAMYCIN) 50 MG capsule Take 50 mg by mouth daily.     estradiol (ESTRACE) 0.1 MG/GM vaginal cream Place vaginally.     famotidine (PEPCID) 40 MG tablet TAKE 1 TABLET DAILY 90 tablet 3   folic acid (FOLVITE) 1 MG tablet Take 1 tablet (1 mg total) by mouth daily. 90 tablet 1   gabapentin (NEURONTIN) 100 MG capsule Take 1 capsule (100 mg total) by mouth 3 (three) times daily. 270 capsule 3   Galcanezumab-gnlm (EMGALITY) 120 MG/ML SOAJ Inject 120 mg into the skin every 30 (thirty) days. 1.12 mL 11   ibuprofen (ADVIL,MOTRIN) 200 MG tablet Take 200 mg by mouth every 6 (six) hours as needed for headache or mild pain.     losartan (COZAAR) 25  MG tablet Take 1 tablet (25 mg total) by mouth daily. 90 tablet 2   meclizine (ANTIVERT) 25 MG tablet Take 0.5-1 tablets (12.5-25 mg total) by mouth 3 (three) times daily as needed for dizziness. 60 tablet 5   Methotrexate Sodium (METHOTREXATE, PF,) 50 MG/2ML injection INJECT 0.7 MLS (17.5 MG TOTAL) INTO THE SKIN ONCE A WEEK. 4 mL 2   metroNIDAZOLE (METROGEL) 0.75 % gel Apply topically in the morning, at noon, and at bedtime.     mupirocin ointment (BACTROBAN) 2 % Place 1 application into the nose 2 (two) times daily. 22 g 1   omeprazole (PRILOSEC) 40 MG capsule Take 40 mg by mouth daily.     ONETOUCH DELICA LANCETS 15V MISC USE TO CHECK SUGARS UP TO 4 TIMES A DAY  0   ONETOUCH ULTRA test strip USE TO CHECK BLOOD SUGARS TWICE A DAY 100 strip 5   Plecanatide (TRULANCE) 3 MG TABS Take 3 mg by mouth as needed. (Samples from GI)     predniSONE (DELTASONE) 10 MG tablet Take 2 tablets (20 mg total) by mouth daily with breakfast. (Patient taking differently: Take 20 mg by mouth as needed.) 10 tablet 0   sertraline (ZOLOFT) 100 MG tablet TAKE 1 TABLET BY MOUTH EVERY DAY 90 tablet  1   SPIRIVA RESPIMAT 2.5 MCG/ACT AERS USE 2 INHALATIONS ORALLY   DAILY (Patient taking differently: Inhale 2 puffs into the lungs daily as needed.) 12 g 1   SYNTHROID 88 MCG tablet TAKE 1 TABLET DAILY 90 tablet 1   traMADol (ULTRAM) 50 MG tablet Take 50 mg by mouth 3 (three) times daily as needed.     triamcinolone ointment (KENALOG) 0.1 % Apply 1 application topically 2 (two) times daily as needed. 30 g 3   Vitamin D, Cholecalciferol, 10 MCG (400 UNIT) CHEW Chew by mouth daily. Unsure of current dose     rosuvastatin (CRESTOR) 5 MG tablet Take 1 tablet (5 mg total) by mouth daily. 90 tablet 3   No current facility-administered medications on file prior to visit.     Review of Systems  Constitutional:  Positive for diaphoresis (at night the past two nights - none during day). Negative for chills and fever.  Respiratory:   Positive for shortness of breath (at times). Negative for cough and wheezing.   Cardiovascular:  Negative for chest pain, palpitations and leg swelling.  Gastrointestinal:  Positive for abdominal pain (left sided a few days ago - related to constipation) and constipation.  Neurological:  Positive for light-headedness and headaches.       Objective:   Vitals:   06/10/22 1303  BP: 120/64  Pulse: 69  Temp: 97.9 F (36.6 C)  SpO2: 92%   BP Readings from Last 3 Encounters:  06/10/22 120/64  05/05/22 120/78  04/21/22 139/81   Wt Readings from Last 3 Encounters:  06/10/22 129 lb (58.5 kg)  05/05/22 130 lb (59 kg)  04/21/22 129 lb (58.5 kg)   Body mass index is 23.59 kg/m.    Physical Exam Constitutional:      General: She is not in acute distress.    Appearance: Normal appearance.  HENT:     Head: Normocephalic and atraumatic.  Eyes:     Conjunctiva/sclera: Conjunctivae normal.  Cardiovascular:     Rate and Rhythm: Normal rate and regular rhythm.     Heart sounds: Normal heart sounds. No murmur heard. Pulmonary:     Effort: Pulmonary effort is normal. No respiratory distress.     Breath sounds: Normal breath sounds. No wheezing.  Musculoskeletal:     Cervical back: Neck supple.     Right lower leg: No edema.     Left lower leg: No edema.  Lymphadenopathy:     Cervical: No cervical adenopathy.  Skin:    General: Skin is warm and dry.     Findings: No rash.  Neurological:     Mental Status: She is alert. Mental status is at baseline.  Psychiatric:        Mood and Affect: Mood normal.        Behavior: Behavior normal.        Lab Results  Component Value Date   WBC 5.4 05/19/2022   HGB 12.1 05/19/2022   HCT 37.7 05/19/2022   PLT 148 (L) 05/19/2022   GLUCOSE 99 05/19/2022   CHOL 195 06/02/2021   TRIG 124.0 06/02/2021   HDL 58.30 06/02/2021   LDLCALC 112 (H) 06/02/2021   ALT 31 (H) 01/20/2022   AST 30 01/20/2022   NA 144 05/19/2022   K 4.0 05/19/2022    CL 105 05/19/2022   CREATININE 0.95 05/19/2022   BUN 9 05/19/2022   CO2 28 05/19/2022   TSH 0.72 12/02/2021   INR 1.08 10/06/2018   HGBA1C  5.9 12/02/2021   MICROALBUR 2.2 (H) 06/02/2021     Assessment & Plan:    See Problem List for Assessment and Plan of chronic medical problems.

## 2022-06-10 ENCOUNTER — Ambulatory Visit: Payer: Medicare Other | Admitting: Internal Medicine

## 2022-06-10 ENCOUNTER — Ambulatory Visit (INDEPENDENT_AMBULATORY_CARE_PROVIDER_SITE_OTHER): Payer: Medicare Other | Admitting: Internal Medicine

## 2022-06-10 VITALS — BP 120/64 | HR 69 | Temp 97.9°F | Ht 62.0 in | Wt 129.0 lb

## 2022-06-10 DIAGNOSIS — E038 Other specified hypothyroidism: Secondary | ICD-10-CM | POA: Diagnosis not present

## 2022-06-10 DIAGNOSIS — F411 Generalized anxiety disorder: Secondary | ICD-10-CM

## 2022-06-10 DIAGNOSIS — E119 Type 2 diabetes mellitus without complications: Secondary | ICD-10-CM | POA: Diagnosis not present

## 2022-06-10 DIAGNOSIS — M069 Rheumatoid arthritis, unspecified: Secondary | ICD-10-CM

## 2022-06-10 DIAGNOSIS — F3289 Other specified depressive episodes: Secondary | ICD-10-CM | POA: Diagnosis not present

## 2022-06-10 DIAGNOSIS — M1712 Unilateral primary osteoarthritis, left knee: Secondary | ICD-10-CM

## 2022-06-10 DIAGNOSIS — I6523 Occlusion and stenosis of bilateral carotid arteries: Secondary | ICD-10-CM | POA: Diagnosis not present

## 2022-06-10 DIAGNOSIS — G8929 Other chronic pain: Secondary | ICD-10-CM

## 2022-06-10 DIAGNOSIS — Z79899 Other long term (current) drug therapy: Secondary | ICD-10-CM

## 2022-06-10 DIAGNOSIS — K219 Gastro-esophageal reflux disease without esophagitis: Secondary | ICD-10-CM

## 2022-06-10 MED ORDER — ALPRAZOLAM 0.25 MG PO TABS
ORAL_TABLET | ORAL | 2 refills | Status: AC
Start: 2022-06-10 — End: ?

## 2022-06-10 NOTE — Assessment & Plan Note (Signed)
Chronic Diet controlled Check A1c Continue diabetic diet

## 2022-06-10 NOTE — Assessment & Plan Note (Signed)
Chronic  Clinically euthyroid Currently taking Synthroid 88 mcg daily Last TSH in normal range-continue current dose

## 2022-06-10 NOTE — Patient Instructions (Addendum)
    Your a1c is 5.6% - in the normal range.      Medications changes include :   none    Return in about 6 months (around 12/10/2022) for follow up.

## 2022-06-10 NOTE — Assessment & Plan Note (Signed)
Chronic Controlled, Stable Continue sertraline 100 mg daily

## 2022-06-10 NOTE — Assessment & Plan Note (Signed)
Chronic Controlled, Stable Continue sertraline 100 mg daily, Xanax 0.25 mg daily as needed

## 2022-06-10 NOTE — Assessment & Plan Note (Signed)
Chronic GERD controlled Continue famotidine 40 mg daily, omeprazole 40 mg twice daily

## 2022-06-11 ENCOUNTER — Ambulatory Visit: Payer: Medicare Other | Admitting: Internal Medicine

## 2022-06-11 DIAGNOSIS — M25561 Pain in right knee: Secondary | ICD-10-CM | POA: Diagnosis not present

## 2022-06-11 DIAGNOSIS — M25562 Pain in left knee: Secondary | ICD-10-CM | POA: Diagnosis not present

## 2022-06-11 LAB — POCT GLYCOSYLATED HEMOGLOBIN (HGB A1C)
HbA1c POC (<> result, manual entry): 5.6 % (ref 4.0–5.6)
HbA1c, POC (controlled diabetic range): 5.6 % (ref 0.0–7.0)
HbA1c, POC (prediabetic range): 5.6 % — AB (ref 5.7–6.4)
Hemoglobin A1C: 5.6 % (ref 4.0–5.6)

## 2022-06-11 NOTE — Addendum Note (Signed)
Addended by: Marcina Millard on: 06/11/2022 02:06 PM   Modules accepted: Orders

## 2022-06-11 NOTE — Progress Notes (Deleted)
Office Visit Note  Patient: Lisa Vega             Date of Birth: [birth year]           MRN: [id number]             PCP: Binnie Rail, MD Referring: Binnie Rail, MD Visit Date: 06/11/2022   Subjective:  No chief complaint on file.   History of Present Illness: Lisa Vega is a 88 y.o. female here for follow up ***   Previous HPI    No Rheumatology ROS completed.   PMFS History:  Patient Active Problem List   Diagnosis Date Noted   Sinusitis 02/09/2022   Acute respiratory failure ([place]) 01/29/2022   Pain in left shoulder 01/20/2022   Pain of left hip joint 01/12/2022   Stress incontinence 11/07/2021   Urinary frequency 10/20/2021   Arthralgia of lower leg 02/24/2021   On long term drug therapy 02/24/2021   Myalgia 02/24/2021   Anxiety state 02/24/2021   Diverticular disease of colon 02/24/2021   Dysphagia 02/24/2021   Essential tremor 02/24/2021   History of colonic polyps 02/24/2021   Mixed hyperlipidemia 02/24/2021   Vitamin B12 deficiency 02/24/2021   Vitamin D deficiency 02/24/2021   Slow transit constipation 02/24/2021   COVID-19 01/28/2021   Headache, cervicogenic 01/03/2021   High risk medication use 12/06/2020   Rosacea 11/29/2020   Cervical spondylosis 04/18/2020   Primary osteoarthritis of first carpometacarpal joint of right hand 05/10/2019   Headache syndrome 03/09/2019   Carotid stenosis, left 09/19/2018   Osteoarthritis of left knee 05/12/2018   Benign positional vertigo 03/10/2018   History of lung cancer 11/29/2017   Difficulty sleeping 09/22/2017   Diabetes ([place]) 05/13/2017   Fatigue 05/12/2017   CAP (community acquired pneumonia) 05/03/2017   Orthopnea 01/12/2017   GERD (gastroesophageal reflux disease) 11/09/2016   Osteoporosis 05/06/2016   Depression 05/06/2016   Hiatal hernia 05/06/2016   Hypothyroidism 05/06/2016   Tremor 10/28/2015   Memory difficulties 10/28/2015   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  ([place]) 05/30/2014   Diverticulitis of colon without hemorrhage 08/07/2013   Prolapse of female pelvic organs 06/22/2013   Neck pain 03/10/2013   Vaginal atrophy 03/09/2013   Rectocele 03/09/2013   Cystocele 03/09/2013   Urinary incontinence 03/09/2013   COPD (chronic obstructive pulmonary disease) ([place]) 01/16/2011   CARCINOMA, LUNG, NONSMALL CELL 12/17/2010   Rheumatoid arthritis ([place]) 12/17/2010   Dyspnea on exertion 12/17/2010    Past Medical History:  Diagnosis Date   Anxiety    Arthritis    RA   BPV (benign positional vertigo) 03/10/2018   Cancer ([place])    lung ca   Carotid stenosis, left 09/19/2018   COPD (chronic obstructive pulmonary disease) (H[facility])    Depression    Dyspnea    with exertion   GERD (gastroesophageal reflux disease)    Headache syndrome 03/09/2019   Hypercholesteremia    Hypothyroidism    Memory difficulties 10/28/2015   Pneumonia     x3  last time 2017   Pre-diabetes    Rheumatoid arthritis(714.0)    Thyroid disease    Tremor 10/28/2015   Jaw tremor   Uterine prolapse    Vitamin D deficiency     Family History  Problem Relation Age of Onset   Emphysema Mother    Asthma Mother    Lung cancer Father    Pancreatic cancer Father    Bone cancer Father    Heart  disease Brother        x1   Diabetes Brother    Hypertension Brother    Lupus Brother    Heart disease Sister        x2   Diabetes Sister    Hypertension Sister    Diabetes Sister    Hypertension Sister    Diabetes Brother    Hypertension Brother    Stroke Maternal Grandfather    Hypertension Son    Tremor Son    Past Surgical History:  Procedure Laterality Date   ANGIOPLASTY Left 10/10/2018   Procedure: ANGIOPLASTY using 1cm x 14cm Xenosure Patch;  Surgeon: Marty Heck, MD;  [facility]: [facility] OR;  Service: Vascular;  Laterality: Left;   APPENDECTOMY     COLONOSCOPY W/ POLYPECTOMY     ENDARTERECTOMY Left 10/10/2018   Procedure: ENDARTERECTOMY CAROTID;  Surgeon: Marty Heck, MD;  [facility]: [facility];  Service: Vascular;  Laterality: Left;   EYE SURGERY Bilateral    cataract   FOOT SURGERY Bilateral    arthritis   Hands  Bilateral    Arthritis   HEMORRHOID SURGERY     right lobectomy  10/2004   TONSILLECTOMY AND ADENOIDECTOMY     Social History   Social History Narrative   09/09/21 lives with dgtr Lovey Newcomer; widowed   Patient drinks about 3 cups of coffee daily.   Patient is right handed.    Retired Corporate treasurer at [employer] History  Administered Date(s) Administered   Fluad Quad(high Dose 65+) 12/08/2019, 10/31/2020   Influenza Split 10/18/2009, 09/28/2011, 10/28/2011, 09/28/2015, 10/09/2015   Influenza Whole 09/27/2012   Influenza, High Dose Seasonal PF 10/28/2016, 01/12/2017, 09/22/2017, 10/03/2018   Influenza,inj,Quad PF,6+ Mos 09/20/2013, 09/12/2014   Pneumococcal Conjugate-13 10/24/2015, 10/31/2020   Pneumococcal Polysaccharide-23 12/29/2007, 07/10/2008     Objective: Vital Signs: There were no vitals taken for this visit.   Physical Exam   Musculoskeletal Exam: ***  CDAI Exam: CDAI Score: -- Patient Global: --; Provider Global: -- Swollen: --; Tender: -- Joint Exam 06/11/2022   No joint exam has been documented for this visit   There is currently no information documented on the homunculus. Go to the Rheumatology activity and complete the homunculus joint exam.  Investigation: No additional findings.  Imaging: MR CARDIAC MORPHOLOGY W WO CONTRAST  Result Date: 05/22/2022 CLINICAL DATA:  40F with HFrEF (EF 40-45% on echo). No ischemia on Lexiscan Myoview EXAM: CARDIAC MRI TECHNIQUE: The patient was scanned on a 1.5 Tesla Siemens magnet. A dedicated cardiac coil was used. Functional imaging was done using Fiesta sequences. 2,3, and 4 chamber views were done to assess for RWMA's. Modified Simpson's rule using a short axis stack was used to calculate an ejection fraction on a dedicated work Brewing technologist. The patient received 6 cc of Gadavist. After 10 minutes inversion recovery sequences were used to assess for infiltration and scar tissue. CONTRAST:  6 cc  of Gadavist FINDINGS: Left ventricle: -Mild hypertrophy -Normal size -Mild systolic dysfunction -Normal ECV (27%) -No LGE LV EF:  43% (Normal 56-78%) Absolute volumes: LV EDV: 147mL (Normal 52-141 mL) LV ESV: 45mL (Normal 13-51 mL) LV SV: 88mL (Normal 33-97 mL) CO: 3.6L/min (Normal 2.7-6.0 L/min) Indexed volumes: LV EDV: 32mL/sq-m (Normal 41-81 mL/sq-m) LV ESV: 22mL/sq-m (Normal 12-21 mL/sq-m) LV SV: 69mL/sq-m (Normal 26-56 mL/sq-m) CI: 2.3L/min/sq-m (Normal 1.8-3.8 L/min/sq-m) Right ventricle: Normal size and systolic function RV EF: 72% (Normal 47-80%) Absolute volumes: RV EDV: 154mL (Normal  58-154 mL) RV ESV: 62mL (Normal 12-68 mL) RV SV: 80mL (Normal 35-98 mL) CO: 3.8L/min (Normal 2.7-6 L/min) Indexed volumes: RV EDV: 61mL/sq-m (Normal 48-87 mL/sq-m) RV ESV: 37mL/sq-m (Normal 11-28 mL/sq-m) RV SV: 65mL/sq-m (Normal 27-57 mL/sq-m) CI: 2.4L/min/sq-m (Normal 1.8-3.8 L/min/sq-m) Left atrium: Mild enlargement Right atrium: Normal size Mitral valve: No regurgitation Aortic valve: No regurgitation Tricuspid valve: Mild regurgitation Pulmonic valve: No regurgitation Aorta: Normal proximal ascending aorta Pericardium: Normal IMPRESSION: 1. Normal LV size, mild hypertrophy, and mild systolic dysfunction (EF 74%) 2.  Normal RV size and systolic function (EF 12%) 3.  No late gadolinium enhancement to suggest myocardial scar Electronically Signed   By: Oswaldo Milian M.D.   On: 05/22/2022 13:23    Recent Labs: Lab Results  Component Value Date   WBC 5.4 05/19/2022   HGB 12.1 05/19/2022   PLT 148 (L) 05/19/2022   NA 144 05/19/2022   K 4.0 05/19/2022   CL 105 05/19/2022   CO2 28 05/19/2022   GLUCOSE 99 05/19/2022   BUN 9 05/19/2022   CREATININE 0.95 05/19/2022   BILITOT 0.8 01/20/2022   ALKPHOS 87 12/02/2021   AST 30 01/20/2022    ALT 31 (H) 01/20/2022   PROT 6.9 01/20/2022   ALBUMIN 3.7 12/02/2021   CALCIUM 9.1 05/19/2022   GFRAA 63 06/25/2021    Speciality Comments: No specialty comments available.  Procedures:  No procedures performed Allergies: Codeine, Meperidine hcl, Demerol, Escitalopram, Meperidine hcl, Other, and Sulfa antibiotics   Assessment / Plan:     Visit Diagnoses: No diagnosis found.  ***  Orders: No orders of the defined types were placed in this encounter.  No orders of the defined types were placed in this encounter.    Follow-Up Instructions: No follow-ups on file.   Collier Salina, MD  Note - This record has been created using [employer].  Chart creation errors have been sought, but may not always  have been located. Such creation errors do not reflect on  the standard of medical care.

## 2022-06-12 ENCOUNTER — Ambulatory Visit: Payer: Medicare Other | Admitting: Podiatry

## 2022-06-17 DIAGNOSIS — N8110 Cystocele, unspecified: Secondary | ICD-10-CM | POA: Diagnosis not present

## 2022-06-17 DIAGNOSIS — N3020 Other chronic cystitis without hematuria: Secondary | ICD-10-CM | POA: Diagnosis not present

## 2022-07-06 ENCOUNTER — Other Ambulatory Visit: Payer: Self-pay | Admitting: Internal Medicine

## 2022-07-08 DIAGNOSIS — H18513 Endothelial corneal dystrophy, bilateral: Secondary | ICD-10-CM | POA: Diagnosis not present

## 2022-07-08 DIAGNOSIS — H02403 Unspecified ptosis of bilateral eyelids: Secondary | ICD-10-CM | POA: Diagnosis not present

## 2022-07-08 DIAGNOSIS — Z961 Presence of intraocular lens: Secondary | ICD-10-CM | POA: Diagnosis not present

## 2022-07-08 DIAGNOSIS — L718 Other rosacea: Secondary | ICD-10-CM | POA: Diagnosis not present

## 2022-07-21 ENCOUNTER — Ambulatory Visit: Payer: Medicare Other | Admitting: Podiatry

## 2022-07-26 NOTE — Progress Notes (Unsigned)
Cardiology Office Note:    Date:  07/27/2022   ID:  Lisa Vega, DOB 01/27/1932, MRN [id number]  PCP:  Binnie Rail, MD  Cardiologist:  Kathlyn Sacramento, MD  Electrophysiologist:  None   Referring MD: Donato Heinz*   Chief Complaint  Patient presents with   Congestive Heart Failure    History of Present Illness:    Lisa Vega is a 88 y.o. female with a hx of chronic combined systolic and diastolic heart failure, lung cancer, carotid stenosis, COPD, GERD, hyperlipidemia, hypothyroidism, rheumatoid arthritis who presents for follow-up.  She was referred by Dr. Quay Burow for evaluation of bradycardia.  She reports has noted heart rate in the 30s when checks pulse ox at home.  Does feel weak when heart rate is low.  She denies any lightheadedness or syncope.  Reports has been having tightness in her lower chest.  Circles around to her back.  No clear relation with exertion, but does report she gets short of breath with minimal exertion.  She denies any lower extremity edema or palpitations.  She quit smoking 2005.  Family history includes brother died of MI in 59s, sister died of MI in 49s, father brother had multiple stents placed in his 93s.  Echocardiogram 02/19/2022 showed EF 40 to 45%, global hypokinesis, mild RV dysfunction, mild to moderate TR.  Zio patch x14 days on 01/28/2022 showed occasional PVCs (3.7%), occasional PACs (1.1%), 20 episodes of SVT with longest lasting 17 beats.  Lexiscan Myoview on 03/18/2022 showed normal perfusion, EF 51%.  Cardiac MRI on 05/20/2022 showed mild LV dysfunction (EF 43%), normal RV function, no LGE.  Since last clinic visit, she reports that she has been doing well.  Denies any chest pain, lower extremity edema, or palpitations.  Reports occasional dyspnea.  Reports occasional lightheadedness which she attributes to vertigo, denies any syncope.  Past Medical History:  Diagnosis Date   Anxiety    Arthritis    RA   BPV (benign positional  vertigo) 03/10/2018   Cancer ([place])    lung ca   Carotid stenosis, left 09/19/2018   COPD (chronic obstructive pulmonary disease) (HCC)    Depression    Dyspnea    with exertion   GERD (gastroesophageal reflux disease)    Headache syndrome 03/09/2019   Hypercholesteremia    Hypothyroidism    Memory difficulties 10/28/2015   Pneumonia     x3  last time 2017   Pre-diabetes    Rheumatoid arthritis(714.0)    Thyroid disease    Tremor 10/28/2015   Jaw tremor   Uterine prolapse    Vitamin D deficiency     Past Surgical History:  Procedure Laterality Date   ANGIOPLASTY Left 10/10/2018   Procedure: ANGIOPLASTY using 1cm x 14cm Xenosure Patch;  Surgeon: Marty Heck, MD;  Location: [facility] OR;  Service: Vascular;  Laterality: Left;   APPENDECTOMY     COLONOSCOPY W/ POLYPECTOMY     ENDARTERECTOMY Left 10/10/2018   Procedure: ENDARTERECTOMY CAROTID;  Surgeon: Marty Heck, MD;  Location: [facility] OR;  Service: Vascular;  Laterality: Left;   EYE SURGERY Bilateral    cataract   FOOT SURGERY Bilateral    arthritis   Hands  Bilateral    Arthritis   HEMORRHOID SURGERY     right lobectomy  10/2004   TONSILLECTOMY AND ADENOIDECTOMY      Current Medications: Current Meds  Medication Sig   acetaminophen (TYLENOL) 325 MG tablet Take 650 mg by mouth every  6 (six) hours as needed for mild pain.   albuterol (PROAIR HFA) 108 (90 Base) MCG/ACT inhaler Inhale 2 puffs into the lungs every 6 (six) hours as needed for wheezing or shortness of breath.   ALPRAZolam (XANAX) 0.25 MG tablet TAKE 1 TABLET BY MOUTH EVERY DAY AS NEEDED FOR ANXIETY   aspirin EC 81 MG tablet Take 81 mg by mouth daily.   B-D TB SYRINGE 1CC/27GX1/2" 27G X 1/2" 1 ML MISC For use with methotrexate injection weekly   budesonide-formoterol (SYMBICORT) 160-4.5 MCG/ACT inhaler USE 2 INHALATIONS ORALLY   TWICE DAILY   clidinium-chlordiazePOXIDE (LIBRAX) 5-2.5 MG capsule SMARTSIG:1 Capsule(s) By Mouth Every 12 Hours PRN    doxycycline (VIBRAMYCIN) 50 MG capsule Take 50 mg by mouth daily.   estradiol (ESTRACE) 0.1 MG/GM vaginal cream Place vaginally.   famotidine (PEPCID) 40 MG tablet TAKE 1 TABLET DAILY   folic acid (FOLVITE) 1 MG tablet Take 1 tablet (1 mg total) by mouth daily.   gabapentin (NEURONTIN) 100 MG capsule Take 1 capsule (100 mg total) by mouth 3 (three) times daily.   Galcanezumab-gnlm (EMGALITY) 120 MG/ML SOAJ Inject 120 mg into the skin every 30 (thirty) days.   ibuprofen (ADVIL,MOTRIN) 200 MG tablet Take 200 mg by mouth every 6 (six) hours as needed for headache or mild pain.   losartan (COZAAR) 25 MG tablet Take 1 tablet (25 mg total) by mouth daily.   meclizine (ANTIVERT) 25 MG tablet Take 0.5-1 tablets (12.5-25 mg total) by mouth 3 (three) times daily as needed for dizziness.   Methotrexate Sodium (METHOTREXATE, PF,) 50 MG/2ML injection INJECT 0.7 MLS (17.5 MG TOTAL) INTO THE SKIN ONCE A WEEK.   metroNIDAZOLE (METROGEL) 0.75 % gel Apply topically in the morning, at noon, and at bedtime.   mupirocin ointment (BACTROBAN) 2 % Place 1 application into the nose 2 (two) times daily.   omeprazole (PRILOSEC) 40 MG capsule Take 40 mg by mouth daily.   ONETOUCH DELICA LANCETS 00T MISC USE TO CHECK SUGARS UP TO 4 TIMES A DAY   ONETOUCH ULTRA test strip USE TO CHECK BLOOD SUGARS TWICE A DAY   Plecanatide (TRULANCE) 3 MG TABS Take 3 mg by mouth as needed. (Samples from GI)   predniSONE (DELTASONE) 10 MG tablet Take 2 tablets (20 mg total) by mouth daily with breakfast. (Patient taking differently: Take 20 mg by mouth as needed.)   sertraline (ZOLOFT) 100 MG tablet TAKE 1 TABLET BY MOUTH EVERY DAY   SPIRIVA RESPIMAT 2.5 MCG/ACT AERS USE 2 INHALATIONS ORALLY   DAILY (Patient taking differently: Inhale 2 puffs into the lungs daily as needed.)   SYNTHROID 88 MCG tablet TAKE 1 TABLET DAILY   traMADol (ULTRAM) 50 MG tablet Take 50 mg by mouth 3 (three) times daily as needed.   triamcinolone ointment (KENALOG) 0.1  % Apply 1 application topically 2 (two) times daily as needed.   Vitamin D, Cholecalciferol, 10 MCG (400 UNIT) CHEW Chew by mouth daily. Unsure of current dose     Allergies:   Codeine, Meperidine hcl, Demerol, Escitalopram, Meperidine hcl, Other, and Sulfa antibiotics   Social History   Socioeconomic History   Marital status: Widowed    Spouse name: Not on file   Number of children: 3   Years of education: LPN   Highest education level: Not on file  Occupational History   Occupation: retired    Fish farm manager: RETIRED    Comment: LPN at [employer] Use   Smoking status:  Former    Packs/day: 1.00    Years: 20.00    Total pack years: 20.00    Types: Cigarettes    Quit date: 11/10/2004    Years since quitting: 17.7   Smokeless tobacco: Never  Vaping Use   Vaping Use: Never used  Substance and Sexual Activity   Alcohol use: Yes    Alcohol/week: 1.0 standard drink of alcohol    Types: 1 Glasses of wine per week    Comment: socially   Drug use: No   Sexual activity: Not Currently  Other Topics Concern   Not on file  Social History Narrative   09/09/21 lives with dgtr Lovey Newcomer; widowed   Patient drinks about 3 cups of coffee daily.   Patient is right handed.    Retired Corporate treasurer at [employer] Strain: Low Risk  (09/12/2021)   Overall Financial Resource Strain (CARDIA)    Difficulty of Paying Living Expenses: Not hard at all  Food Insecurity: No Food Insecurity (09/12/2021)   Hunger Vital Sign    Worried About Running Out of Food in the Last Year: Never true    Ran Out of Food in the Last Year: Never true  Transportation Needs: No Transportation Needs (09/12/2021)   PRAPARE - Hydrologist (Medical): No    Lack of Transportation (Non-Medical): No  Physical Activity: Inactive (09/12/2021)   Exercise Vital Sign    Days of Exercise per Week: 0 days    Minutes of Exercise  per Session: 0 min  Stress: No Stress Concern Present (09/12/2021)   [place]    Feeling of Stress : Not at all  Social Connections: Socially Isolated (09/12/2021)   Social Connection and Isolation Panel [NHANES]    Frequency of Communication with Friends and Family: More than three times a week    Frequency of Social Gatherings with Friends and Family: Once a week    Attends Religious Services: Never    Marine scientist or Organizations: No    Attends Archivist Meetings: Never    Marital Status: Widowed     Family History: The patient's family history includes Asthma in her mother; Bone cancer in her father; Diabetes in her brother, brother, sister, and sister; Emphysema in her mother; Heart disease in her brother and sister; Hypertension in her brother, brother, sister, sister, and son; Lung cancer in her father; Lupus in her brother; Pancreatic cancer in her father; Stroke in her maternal grandfather; Tremor in her son.  ROS:   Please see the history of present illness.     All other systems reviewed and are negative.  EKGs/Labs/Other Studies Reviewed:    The following studies were reviewed today:   EKG:   01/01/22: Normal sinus rhythm, sinus arrhythmia, rate 70, left bundle branch block  Recent Labs: 12/02/2021: TSH 0.72 01/20/2022: ALT 31 05/19/2022: BUN 9; Creatinine, Ser 0.95; Hemoglobin 12.1; Platelets 148; Potassium 4.0; Sodium 144  Recent Lipid Panel    Component Value Date/Time   CHOL 195 06/02/2021 1412   CHOL 245 (H) 04/21/2018 0821   TRIG 124.0 06/02/2021 1412   HDL 58.30 06/02/2021 1412   HDL 63 04/21/2018 0821   CHOLHDL 3 06/02/2021 1412   VLDL 24.8 06/02/2021 1412   LDLCALC 112 (H) 06/02/2021 1412   LDLCALC 148 (H) 04/21/2018 1937    Physical Exam:  VS:  BP (!) 144/86   Pulse 64   Ht 5\' 3"  (1.6 m)   Wt 129 lb (58.5 kg)   SpO2 90%   BMI 22.85 kg/m     Wt Readings  from Last 3 Encounters:  07/27/22 129 lb (58.5 kg)  06/10/22 129 lb (58.5 kg)  05/05/22 130 lb (59 kg)     GEN:  Well nourished, well developed in no acute distress HEENT: Normal NECK: No JVD; No carotid bruits LYMPHATICS: No lymphadenopathy CARDIAC: RRR, no murmurs, rubs, gallops RESPIRATORY:  Clear to auscultation without rales, wheezing or rhonchi  ABDOMEN: Soft, non-tender, non-distended MUSCULOSKELETAL:  No edema; No deformity  SKIN: Warm and dry NEUROLOGIC:  Alert and oriented x 3 PSYCHIATRIC:  Normal affect   ASSESSMENT:    1. Chronic combined systolic and diastolic heart failure ([place])   2. Medication management   3. Mixed hyperlipidemia   4. Bradycardia   5. Precordial pain   6. Carotid stenosis, bilateral      PLAN:    Chronic combined systolic and diastolic heart failure: Echocardiogram 02/19/2022 showed EF 40 to 45%, global hypokinesis, mild RV dysfunction, mild to moderate TR. she is reporting atypical chest pain.  EKG with left bundle branch block.  Lexiscan Myoview on 03/18/2022 showed normal perfusion, EF 51%.  Cardiac MRI on 05/20/2022 showed mild LV dysfunction (EF 43%), normal RV function, no LGE. -Continue losartan 25 mg daily.  Check BMET -Hold off on beta-blocker for now given issues with bradycardia and COPD  Bradycardia: Reports heart rate down to 30s at home and episodes of weakness.  Zio patch x14 days on 01/28/2022 showed occasional PVCs (3.7%), occasional PACs (1.1%), 20 episodes of SVT with longest lasting 17 beats  Chest pain: Atypical in description but does have significant CAD risk factors and has known carotid stenosis.  Given reduced systolic function, recommend ischemic evaluation.  Lexiscan Myoview on 03/18/2022 showed normal perfusion.  Reports chest pain has improved  Carotid stenosis: Status post left CEA 09/2018.  Carotid duplex 03/2021 showed 40 to 59% right stenosis and 1 to 39% left stenosis -Follows with vascular surgery -Continue aspirin  81 mg daily -LDL 112 on 06/02/2021, started atorvastatin 20 mg daily but reports she did not tolerate.  Started rosuvastatin 5 mg daily 02/2022, appears to be tolerating.  Check lipid panel   RTC in 6 months    Medication Adjustments/Labs and Tests Ordered: Current medicines are reviewed at length with the patient today.  Concerns regarding medicines are outlined above.  Orders Placed This Encounter  Procedures   Lipid panel   Basic metabolic panel   No orders of the defined types were placed in this encounter.   Patient Instructions  Medication Instructions:  NO CHANGES  *If you need a refill on your cardiac medications before your next appointment, please call your pharmacy*   Lab Work: BMET & Lipid Panel today   If you have labs (blood work) drawn today and your tests are completely normal, you will receive your results only by: [place] (if you have MyChart) OR A paper copy in the mail If you have any lab test that is abnormal or we need to change your treatment, we will call you to review the results.   Testing/Procedures: NONE   Follow-Up: At [facility], you and your health needs are our priority.  As part of our continuing mission to provide you with exceptional heart care, we have created designated Provider Care Teams.  These Care  Teams include your primary Cardiologist (physician) and Advanced Practice Providers (APPs -  Physician Assistants and Nurse Practitioners) who all work together to provide you with the care you need, when you need it.  We recommend signing up for the patient portal called "MyChart".  Sign up information is provided on this After Visit Summary.  MyChart is used to connect with patients for Virtual Visits (Telemedicine).  Patients are able to view lab/test results, encounter notes, upcoming appointments, etc.  Non-urgent messages can be sent to your provider as well.   To learn more about what you can do with MyChart, go to  [email or web address].    Your next appointment:   6 month(s)  The format for your next appointment:   In Person  Provider:   Dr. Gardiner Rhyme   Signed, Donato Heinz, MD  07/27/2022 12:53 PM    [place]

## 2022-07-27 ENCOUNTER — Ambulatory Visit (INDEPENDENT_AMBULATORY_CARE_PROVIDER_SITE_OTHER): Payer: Medicare Other | Admitting: Cardiology

## 2022-07-27 ENCOUNTER — Encounter: Payer: Self-pay | Admitting: Cardiology

## 2022-07-27 VITALS — BP 144/86 | HR 64 | Ht 63.0 in | Wt 129.0 lb

## 2022-07-27 DIAGNOSIS — Z79899 Other long term (current) drug therapy: Secondary | ICD-10-CM | POA: Diagnosis not present

## 2022-07-27 DIAGNOSIS — R072 Precordial pain: Secondary | ICD-10-CM

## 2022-07-27 DIAGNOSIS — I5042 Chronic combined systolic (congestive) and diastolic (congestive) heart failure: Secondary | ICD-10-CM

## 2022-07-27 DIAGNOSIS — I6523 Occlusion and stenosis of bilateral carotid arteries: Secondary | ICD-10-CM | POA: Diagnosis not present

## 2022-07-27 DIAGNOSIS — R001 Bradycardia, unspecified: Secondary | ICD-10-CM | POA: Diagnosis not present

## 2022-07-27 DIAGNOSIS — E782 Mixed hyperlipidemia: Secondary | ICD-10-CM | POA: Diagnosis not present

## 2022-07-27 LAB — LIPID PANEL
Chol/HDL Ratio: 2.5 ratio (ref 0.0–4.4)
Cholesterol, Total: 148 mg/dL (ref 100–199)
HDL: 60 mg/dL (ref >39)
LDL Chol Calc (NIH): 68 mg/dL (ref 0–99)
Triglycerides: 114 mg/dL (ref 0–149)
VLDL Cholesterol Cal: 20 mg/dL (ref 5–40)

## 2022-07-27 LAB — BASIC METABOLIC PANEL
BUN/Creatinine Ratio: 11 — ABNORMAL LOW (ref 12–28)
BUN: 10 mg/dL (ref 8–27)
CO2: 31 mmol/L — ABNORMAL HIGH (ref 20–29)
Calcium: 9.2 mg/dL (ref 8.7–10.3)
Chloride: 101 mmol/L (ref 96–106)
Creatinine, Ser: 0.92 mg/dL (ref 0.57–1.00)
Glucose: 106 mg/dL — ABNORMAL HIGH (ref 70–99)
Potassium: 3.8 mmol/L (ref 3.5–5.2)
Sodium: 139 mmol/L (ref 134–144)
eGFR: 60 mL/min/{1.73_m2} (ref >59)

## 2022-07-27 NOTE — Patient Instructions (Signed)
Medication Instructions:  NO CHANGES  *If you need a refill on your cardiac medications before your next appointment, please call your pharmacy*   Lab Work: BMET & Lipid Panel today   If you have labs (blood work) drawn today and your tests are completely normal, you will receive your results only by: [place] (if you have MyChart) OR A paper copy in the mail If you have any lab test that is abnormal or we need to change your treatment, we will call you to review the results.   Testing/Procedures: NONE   Follow-Up: At [facility], you and your health needs are our priority.  As part of our continuing mission to provide you with exceptional heart care, we have created designated Provider Care Teams.  These Care Teams include your primary Cardiologist (physician) and Advanced Practice Providers (APPs -  Physician Assistants and Nurse Practitioners) who all work together to provide you with the care you need, when you need it.  We recommend signing up for the patient portal called "MyChart".  Sign up information is provided on this After Visit Summary.  MyChart is used to connect with patients for Virtual Visits (Telemedicine).  Patients are able to view lab/test results, encounter notes, upcoming appointments, etc.  Non-urgent messages can be sent to your provider as well.   To learn more about what you can do with MyChart, go to [email or web address].    Your next appointment:   6 month(s)  The format for your next appointment:   In Person  Provider:   Dr. Gardiner Rhyme

## 2022-07-30 ENCOUNTER — Ambulatory Visit: Payer: Medicare Other | Admitting: Pulmonary Disease

## 2022-07-31 ENCOUNTER — Ambulatory Visit (INDEPENDENT_AMBULATORY_CARE_PROVIDER_SITE_OTHER): Payer: Medicare Other | Admitting: Pulmonary Disease

## 2022-07-31 ENCOUNTER — Ambulatory Visit (INDEPENDENT_AMBULATORY_CARE_PROVIDER_SITE_OTHER): Payer: Medicare Other

## 2022-07-31 ENCOUNTER — Encounter: Payer: Self-pay | Admitting: Pulmonary Disease

## 2022-07-31 VITALS — BP 120/72 | HR 69 | Ht 63.0 in | Wt 128.0 lb

## 2022-07-31 DIAGNOSIS — J441 Chronic obstructive pulmonary disease with (acute) exacerbation: Secondary | ICD-10-CM | POA: Diagnosis not present

## 2022-07-31 DIAGNOSIS — R0602 Shortness of breath: Secondary | ICD-10-CM | POA: Diagnosis not present

## 2022-07-31 DIAGNOSIS — R059 Cough, unspecified: Secondary | ICD-10-CM | POA: Diagnosis not present

## 2022-07-31 MED ORDER — PREDNISONE 10 MG PO TABS: ORAL_TABLET | ORAL | 0 refills | Status: AC

## 2022-07-31 MED ORDER — AZITHROMYCIN 250 MG PO TABS: ORAL_TABLET | ORAL | 0 refills | Status: DC

## 2022-07-31 NOTE — Progress Notes (Signed)
Synopsis: Referred in August 2023 for acute visit, patient of Dr. Ander Slade  Subjective:   PATIENT ID: Lisa Vega GENDER: female DOB: 02-09-1932, MRN: [id number]  HPI  Chief Complaint  Patient presents with   Acute Visit    Increased SOB and chest congestion for the past 3 weeks.    Lisa Vega is an 88 year old woman, former smoker with COPD, GERD, history of lung cancers/p RUL lobectomy, combined systolic and diastolic heart failure and rheumatoid arthritis who returns to pulmonary clinic for acute visit.   She has been using symbicort 160-4.64mcg 2 puffs twice daily and spiriva 2.71mcg 2 puffs daily for her COPD. She has as needed albuterol inhaler.   She reports sore throat, cough, clear sputum production, wheezing, chills, and muscle aches which started over the past couple of days. She does have post-nasal drainage. She denies any sick contacts. Denies fevers.    Past Medical History:  Diagnosis Date   Anxiety    Arthritis    RA   BPV (benign positional vertigo) 03/10/2018   Cancer ([place])    lung ca   Carotid stenosis, left 09/19/2018   COPD (chronic obstructive pulmonary disease) (HCC)    Depression    Dyspnea    with exertion   GERD (gastroesophageal reflux disease)    Headache syndrome 03/09/2019   Hypercholesteremia    Hypothyroidism    Memory difficulties 10/28/2015   Pneumonia     x3  last time 2017   Pre-diabetes    Rheumatoid arthritis(714.0)    Thyroid disease    Tremor 10/28/2015   Jaw tremor   Uterine prolapse    Vitamin D deficiency      Family History  Problem Relation Age of Onset   Emphysema Mother    Asthma Mother    Lung cancer Father    Pancreatic cancer Father    Bone cancer Father    Heart disease Brother        x1   Diabetes Brother    Hypertension Brother    Lupus Brother    Heart disease Sister        x2   Diabetes Sister    Hypertension Sister    Diabetes Sister    Hypertension Sister    Diabetes Brother     Hypertension Brother    Stroke Maternal Grandfather    Hypertension Son    Tremor Son      Social History   Socioeconomic History   Marital status: Widowed    Spouse name: Not on file   Number of children: 3   Years of education: LPN   Highest education level: Not on file  Occupational History   Occupation: retired    Fish farm manager: RETIRED    Comment: LPN at [employer]   Smoking status: Former    Packs/day: 1.00    Years: 20.00    Total pack years: 20.00    Types: Cigarettes    Quit date: 11/10/2004    Years since quitting: 17.7   Smokeless tobacco: Never  Vaping Use   Vaping Use: Never used  Substance and Sexual Activity   Alcohol use: Yes    Alcohol/week: 1.0 standard drink of alcohol    Types: 1 Glasses of wine per week    Comment: socially   Drug use: No   Sexual activity: Not Currently  Other Topics Concern   Not on file  Social History Narrative   09/09/21 lives with  dgtr Lovey Newcomer; widowed   Patient drinks about 3 cups of coffee daily.   Patient is right handed.    Retired Corporate treasurer at [employer] Strain: Low Risk  (09/12/2021)   Overall Financial Resource Strain (CARDIA)    Difficulty of Paying Living Expenses: Not hard at all  Food Insecurity: No Food Insecurity (09/12/2021)   Hunger Vital Sign    Worried About Running Out of Food in the Last Year: Never true    Ran Out of Food in the Last Year: Never true  Transportation Needs: No Transportation Needs (09/12/2021)   PRAPARE - Hydrologist (Medical): No    Lack of Transportation (Non-Medical): No  Physical Activity: Inactive (09/12/2021)   Exercise Vital Sign    Days of Exercise per Week: 0 days    Minutes of Exercise per Session: 0 min  Stress: No Stress Concern Present (09/12/2021)   [place]    Feeling of Stress : Not at all   Social Connections: Socially Isolated (09/12/2021)   Social Connection and Isolation Panel [NHANES]    Frequency of Communication with Friends and Family: More than three times a week    Frequency of Social Gatherings with Friends and Family: Once a week    Attends Religious Services: Never    Marine scientist or Organizations: No    Attends Archivist Meetings: Never    Marital Status: Widowed  Intimate Partner Violence: Not At Risk (09/12/2021)   Humiliation, Afraid, Rape, and Kick questionnaire    Fear of Current or Ex-Partner: No    Emotionally Abused: No    Physically Abused: No    Sexually Abused: No     Allergies  Allergen Reactions   Codeine Other (See Comments)    REACTION: hallucinations   Meperidine Hcl Nausea And Vomiting    REACTION: severe GI upset   Demerol Other (See Comments)   Escitalopram Other (See Comments)    Other reaction(s): memory issues   Meperidine Hcl Other (See Comments)   Other Other (See Comments)   Sulfa Antibiotics Rash     Outpatient Medications Prior to Visit  Medication Sig Dispense Refill   acetaminophen (TYLENOL) 325 MG tablet Take 650 mg by mouth every 6 (six) hours as needed for mild pain.     albuterol (PROAIR HFA) 108 (90 Base) MCG/ACT inhaler Inhale 2 puffs into the lungs every 6 (six) hours as needed for wheezing or shortness of breath. 8.5 g 5   ALPRAZolam (XANAX) 0.25 MG tablet TAKE 1 TABLET BY MOUTH EVERY DAY AS NEEDED FOR ANXIETY 30 tablet 2   aspirin EC 81 MG tablet Take 81 mg by mouth daily.     B-D TB SYRINGE 1CC/27GX1/2" 27G X 1/2" 1 ML MISC For use with methotrexate injection weekly 25 each 1   budesonide-formoterol (SYMBICORT) 160-4.5 MCG/ACT inhaler USE 2 INHALATIONS ORALLY   TWICE DAILY 30.6 g 2   clidinium-chlordiazePOXIDE (LIBRAX) 5-2.5 MG capsule SMARTSIG:1 Capsule(s) By Mouth Every 12 Hours PRN     estradiol (ESTRACE) 0.1 MG/GM vaginal cream Place vaginally.     famotidine (PEPCID) 40 MG tablet TAKE 1  TABLET DAILY 90 tablet 3   folic acid (FOLVITE) 1 MG tablet Take 1 tablet (1 mg total) by mouth daily. 90 tablet 1   gabapentin (NEURONTIN) 100 MG capsule Take 1 capsule (100 mg total) by  mouth 3 (three) times daily. 270 capsule 3   Galcanezumab-gnlm (EMGALITY) 120 MG/ML SOAJ Inject 120 mg into the skin every 30 (thirty) days. 1.12 mL 11   ibuprofen (ADVIL,MOTRIN) 200 MG tablet Take 200 mg by mouth every 6 (six) hours as needed for headache or mild pain.     losartan (COZAAR) 25 MG tablet Take 1 tablet (25 mg total) by mouth daily. 90 tablet 2   meclizine (ANTIVERT) 25 MG tablet Take 0.5-1 tablets (12.5-25 mg total) by mouth 3 (three) times daily as needed for dizziness. 60 tablet 5   Methotrexate Sodium (METHOTREXATE, PF,) 50 MG/2ML injection INJECT 0.7 MLS (17.5 MG TOTAL) INTO THE SKIN ONCE A WEEK. 4 mL 2   metroNIDAZOLE (METROGEL) 0.75 % gel Apply topically in the morning, at noon, and at bedtime.     mupirocin ointment (BACTROBAN) 2 % Place 1 application into the nose 2 (two) times daily. 22 g 1   omeprazole (PRILOSEC) 40 MG capsule Take 40 mg by mouth daily.     ONETOUCH DELICA LANCETS 18A MISC USE TO CHECK SUGARS UP TO 4 TIMES A DAY  0   ONETOUCH ULTRA test strip USE TO CHECK BLOOD SUGARS TWICE A DAY 100 strip 5   Plecanatide (TRULANCE) 3 MG TABS Take 3 mg by mouth as needed. (Samples from GI)     sertraline (ZOLOFT) 100 MG tablet TAKE 1 TABLET BY MOUTH EVERY DAY 90 tablet 1   SPIRIVA RESPIMAT 2.5 MCG/ACT AERS USE 2 INHALATIONS ORALLY   DAILY (Patient taking differently: Inhale 2 puffs into the lungs daily as needed.) 12 g 1   SYNTHROID 88 MCG tablet TAKE 1 TABLET DAILY 90 tablet 1   traMADol (ULTRAM) 50 MG tablet Take 50 mg by mouth 3 (three) times daily as needed.     triamcinolone ointment (KENALOG) 0.1 % Apply 1 application topically 2 (two) times daily as needed. 30 g 3   rosuvastatin (CRESTOR) 5 MG tablet Take 1 tablet (5 mg total) by mouth daily. 90 tablet 3   doxycycline  (VIBRAMYCIN) 50 MG capsule Take 50 mg by mouth daily.     predniSONE (DELTASONE) 10 MG tablet Take 2 tablets (20 mg total) by mouth daily with breakfast. (Patient taking differently: Take 20 mg by mouth as needed.) 10 tablet 0   Vitamin D, Cholecalciferol, 10 MCG (400 UNIT) CHEW Chew by mouth daily. Unsure of current dose     No facility-administered medications prior to visit.    Review of Systems  Constitutional:  Positive for chills and malaise/fatigue. Negative for fever and weight loss.  HENT:  Positive for congestion and sore throat. Negative for sinus pain.   Eyes: Negative.   Respiratory:  Positive for cough, sputum production, shortness of breath and wheezing. Negative for hemoptysis.   Cardiovascular:  Negative for chest pain, palpitations, orthopnea, claudication and leg swelling.  Gastrointestinal:  Negative for abdominal pain, heartburn, nausea and vomiting.  Genitourinary: Negative.   Musculoskeletal:  Negative for joint pain and myalgias.  Skin:  Negative for rash.  Neurological:  Negative for weakness.  Endo/Heme/Allergies: Negative.   Psychiatric/Behavioral: Negative.     Objective:   Vitals:   07/31/22 0952  BP: 120/72  Pulse: 69  SpO2: 94%  Weight: 128 lb (58.1 kg)  Height: 5\' 3"  (1.6 m)     Physical Exam Constitutional:      General: She is not in acute distress.    Appearance: She is not ill-appearing.  HENT:  Head: Normocephalic and atraumatic.  Eyes:     General: No scleral icterus.    Conjunctiva/sclera: Conjunctivae normal.     Pupils: Pupils are equal, round, and reactive to light.  Cardiovascular:     Rate and Rhythm: Normal rate and regular rhythm.     Pulses: Normal pulses.     Heart sounds: Normal heart sounds. No murmur heard. Pulmonary:     Effort: Pulmonary effort is normal.     Breath sounds: Normal breath sounds. Decreased air movement (right base) present. No wheezing, rhonchi or rales.  Abdominal:     General: Bowel sounds are  normal.     Palpations: Abdomen is soft.  Musculoskeletal:     Right lower leg: No edema.     Left lower leg: No edema.  Lymphadenopathy:     Cervical: No cervical adenopathy.  Skin:    General: Skin is warm and dry.  Neurological:     General: No focal deficit present.     Mental Status: She is alert.  Psychiatric:        Mood and Affect: Mood normal.        Behavior: Behavior normal.        Thought Content: Thought content normal.        Judgment: Judgment normal.    CBC    Component Value Date/Time   WBC 5.4 05/19/2022 1152   WBC 9.3 01/20/2022 1335   RBC 3.98 05/19/2022 1152   RBC 4.28 01/20/2022 1335   HGB 12.1 05/19/2022 1152   HGB 12.6 08/10/2011 1457   HCT 37.7 05/19/2022 1152   HCT 36.9 08/10/2011 1457   PLT 148 (L) 05/19/2022 1152   MCV 95 05/19/2022 1152   MCV 99.5 08/10/2011 1457   MCH 30.4 05/19/2022 1152   MCH 31.8 01/20/2022 1335   MCHC 32.1 05/19/2022 1152   MCHC 32.5 01/20/2022 1335   RDW 16.2 (H) 05/19/2022 1152   RDW 14.9 (H) 08/10/2011 1457   LYMPHSABS 3.1 05/19/2022 1152   LYMPHSABS 2.9 08/10/2011 1457   MONOABS 0.5 08/01/2019 1215   MONOABS 0.6 08/10/2011 1457   EOSABS 0.1 05/19/2022 1152   BASOSABS 0.1 05/19/2022 1152   BASOSABS 0.0 08/10/2011 1457      Latest Ref Rng & Units 07/27/2022   10:43 AM 05/19/2022   11:52 AM 03/20/2022    3:20 PM  BMP  Glucose 70 - 99 mg/dL 106  99  124   BUN 8 - 27 mg/dL 10  9  12    Creatinine 0.57 - 1.00 mg/dL 0.92  0.95  0.97   BUN/Creat Ratio 12 - 28 11  9  12    Sodium 134 - 144 mmol/L 139  144  137   Potassium 3.5 - 5.2 mmol/L 3.8  4.0  3.9   Chloride 96 - 106 mmol/L 101  105  100   CO2 20 - 29 mmol/L 31  28  24    Calcium 8.7 - 10.3 mg/dL 9.2  9.1  8.7    Chest imaging:  PFT:    Latest Ref Rng & Units 08/19/2018   12:07 PM 01/24/2018   11:03 AM  PFT Results  FVC-Pre L 1.99  2.02   FVC-Predicted Pre % 85  86   FVC-Post L 2.06  2.15   FVC-Predicted Post % 88  91   Pre FEV1/FVC % % 61  60    Post FEV1/FCV % % 62  63   FEV1-Pre L 1.21  1.20  FEV1-Predicted Pre % 70  69   FEV1-Post L 1.28  1.36   DLCO uncorrected ml/min/mmHg 11.06  11.52   DLCO UNC% % 48  50   DLCO corrected ml/min/mmHg 10.96  11.38   DLCO COR %Predicted % 47  49   DLVA Predicted % 72  70   TLC L 4.49  4.51   TLC % Predicted % 91  92   RV % Predicted % 103  101     Labs:  Path:  Echo:  Heart Catheterization:  Assessment & Plan:   COPD with acute exacerbation (HCC) - Plan: azithromycin (ZITHROMAX) 250 MG tablet, predniSONE (DELTASONE) 10 MG tablet, DG Chest 2 View  Discussion: Lisa Vega is an 88 year old woman, former smoker with COPD, GERD, history of lung cancers/p RUL lobectomy, combined systolic and diastolic heart failure and rheumatoid arthritis who returns to pulmonary clinic for acute visit.   She appears to have acute exacerbation of COPD due to possible viral infection as she is having chills, sore throat and myalgias. We will check chest radiograph. She is to start prednisone taper and Zpak. Will message her with chest x-ray results.  Follow up with Dr. Ander Slade as scheduled.  Freda Jackson, MD [facility] Office: [phone]   Current Outpatient Medications:    acetaminophen (TYLENOL) 325 MG tablet, Take 650 mg by mouth every 6 (six) hours as needed for mild pain., Disp: , Rfl:    albuterol (PROAIR HFA) 108 (90 Base) MCG/ACT inhaler, Inhale 2 puffs into the lungs every 6 (six) hours as needed for wheezing or shortness of breath., Disp: 8.5 g, Rfl: 5   ALPRAZolam (XANAX) 0.25 MG tablet, TAKE 1 TABLET BY MOUTH EVERY DAY AS NEEDED FOR ANXIETY, Disp: 30 tablet, Rfl: 2   aspirin EC 81 MG tablet, Take 81 mg by mouth daily., Disp: , Rfl:    azithromycin (ZITHROMAX) 250 MG tablet, Take as directed, Disp: 6 tablet, Rfl: 0   B-D TB SYRINGE 1CC/27GX1/2" 27G X 1/2" 1 ML MISC, For use with methotrexate injection weekly, Disp: 25 each, Rfl: 1   budesonide-formoterol  (SYMBICORT) 160-4.5 MCG/ACT inhaler, USE 2 INHALATIONS ORALLY   TWICE DAILY, Disp: 30.6 g, Rfl: 2   clidinium-chlordiazePOXIDE (LIBRAX) 5-2.5 MG capsule, SMARTSIG:1 Capsule(s) By Mouth Every 12 Hours PRN, Disp: , Rfl:    estradiol (ESTRACE) 0.1 MG/GM vaginal cream, Place vaginally., Disp: , Rfl:    famotidine (PEPCID) 40 MG tablet, TAKE 1 TABLET DAILY, Disp: 90 tablet, Rfl: 3   folic acid (FOLVITE) 1 MG tablet, Take 1 tablet (1 mg total) by mouth daily., Disp: 90 tablet, Rfl: 1   gabapentin (NEURONTIN) 100 MG capsule, Take 1 capsule (100 mg total) by mouth 3 (three) times daily., Disp: 270 capsule, Rfl: 3   Galcanezumab-gnlm (EMGALITY) 120 MG/ML SOAJ, Inject 120 mg into the skin every 30 (thirty) days., Disp: 1.12 mL, Rfl: 11   ibuprofen (ADVIL,MOTRIN) 200 MG tablet, Take 200 mg by mouth every 6 (six) hours as needed for headache or mild pain., Disp: , Rfl:    losartan (COZAAR) 25 MG tablet, Take 1 tablet (25 mg total) by mouth daily., Disp: 90 tablet, Rfl: 2   meclizine (ANTIVERT) 25 MG tablet, Take 0.5-1 tablets (12.5-25 mg total) by mouth 3 (three) times daily as needed for dizziness., Disp: 60 tablet, Rfl: 5   Methotrexate Sodium (METHOTREXATE, PF,) 50 MG/2ML injection, INJECT 0.7 MLS (17.5 MG TOTAL) INTO THE SKIN ONCE A WEEK., Disp: 4 mL, Rfl:  2   metroNIDAZOLE (METROGEL) 0.75 % gel, Apply topically in the morning, at noon, and at bedtime., Disp: , Rfl:    mupirocin ointment (BACTROBAN) 2 %, Place 1 application into the nose 2 (two) times daily., Disp: 22 g, Rfl: 1   omeprazole (PRILOSEC) 40 MG capsule, Take 40 mg by mouth daily., Disp: , Rfl:    ONETOUCH DELICA LANCETS 79K MISC, USE TO CHECK SUGARS UP TO 4 TIMES A DAY, Disp: , Rfl: 0   ONETOUCH ULTRA test strip, USE TO CHECK BLOOD SUGARS TWICE A DAY, Disp: 100 strip, Rfl: 5   Plecanatide (TRULANCE) 3 MG TABS, Take 3 mg by mouth as needed. (Samples from GI), Disp: , Rfl:    predniSONE (DELTASONE) 10 MG tablet, Take 3 tablets (30 mg total) by  mouth daily with breakfast for 3 days, THEN 2 tablets (20 mg total) daily with breakfast for 3 days, THEN 1 tablet (10 mg total) daily with breakfast for 3 days., Disp: 18 tablet, Rfl: 0   sertraline (ZOLOFT) 100 MG tablet, TAKE 1 TABLET BY MOUTH EVERY DAY, Disp: 90 tablet, Rfl: 1   SPIRIVA RESPIMAT 2.5 MCG/ACT AERS, USE 2 INHALATIONS ORALLY   DAILY (Patient taking differently: Inhale 2 puffs into the lungs daily as needed.), Disp: 12 g, Rfl: 1   SYNTHROID 88 MCG tablet, TAKE 1 TABLET DAILY, Disp: 90 tablet, Rfl: 1   traMADol (ULTRAM) 50 MG tablet, Take 50 mg by mouth 3 (three) times daily as needed., Disp: , Rfl:    triamcinolone ointment (KENALOG) 0.1 %, Apply 1 application topically 2 (two) times daily as needed., Disp: 30 g, Rfl: 3   rosuvastatin (CRESTOR) 5 MG tablet, Take 1 tablet (5 mg total) by mouth daily., Disp: 90 tablet, Rfl: 3

## 2022-07-31 NOTE — Patient Instructions (Signed)
Start steroid taper 30mg  daily for 3 days 20mg  daily for 3 days 10mg  daily for 3 days  Start Zpak antibiotic  We will check chest x-ray today and message you with the results  Follow up as scheduled with Dr. Ander Slade

## 2022-08-03 ENCOUNTER — Ambulatory Visit: Payer: Medicare Other | Admitting: Podiatry

## 2022-08-04 ENCOUNTER — Telehealth: Payer: Self-pay | Admitting: Pulmonary Disease

## 2022-08-04 NOTE — Telephone Encounter (Signed)
I sent in a prescription for 9 days of prednisone which should take her to 8/12. I do not believe another antibiotic is necessary at this time.   Her chest x-ray shows increased interstitial markings. If her oxygen levels are dropping she may need to be evaluated in the ER for CT Chest scan.   I would recommend she be scheduled for acute visit with an APP for further evaluation and possible lab work.   Thanks, JD

## 2022-08-04 NOTE — Telephone Encounter (Signed)
She was on a Zpack and short term of Prednisone around 07/31/22. He oxygen is level is going to 89% on room air. She is having some cough that is still green and feels tired all the day.  She has 2 days left of prednisone left and chest is feeling tight . She is wanting a stronger antibiotic called into the pharmacy. She feels she cant shake it. Please advise.

## 2022-08-04 NOTE — Telephone Encounter (Signed)
Called and spoke with patient. Patient verbalized understanding. Patient made an appointment with Derl Barrow for 08/18/2022.   Nothing further needed.

## 2022-08-06 ENCOUNTER — Telehealth: Payer: Self-pay | Admitting: Pulmonary Disease

## 2022-08-06 NOTE — Telephone Encounter (Signed)
Called and spoke with pt letting her know that JD stated if no better after recent meds that we needed to get her in for an OV to further evaluate and she verbalized understanding. Appt has been scheduled for pt. Nothing further needed.

## 2022-08-07 NOTE — Progress Notes (Unsigned)
08/10/22- 17 yoF former smoker (20 pk yrs) for acute visit Medical problem list includes Hx NSSCaLung/ RUL lobectomy, CHF, COPD, Diverticulosis, GERD, DM, Hypothyroid, Hyperparathyroid, Rheumatoid Arthritis, Hx Coid Infection, Carotid Stenosis,  O2 2L/[employer]- sleep and POC Seen 8/4 for Acute Visit- exacerb COPD, by Dr Dewald>> Zpak, prednisone. Meds- Proair hfa, Symbicort 160, MTX inj, Spiriva 2.5,  Here with daughter.  Says prednisone and Z-Pak last week made no difference.  Onset of this illness was as I infection 3 weeks ago.  Some wheeze.  Cough productive scant green especially after using Symbicort.  Left ear ache.  Main complaint is tightness with some air hunger, shallow breathing and increased dyspnea on exertion.  No chest pain or palpitation.  Denies edema or pain in the legs.  Long car ride about 3 weeks ago.  We discussed possibility of blood clot.  Has home oxygen and portable concentrator at 2 L.  Instruction was to sleep with it but she uses it only occasionally.   She was concerned with history of lung cancer, that she understood her most recent chest x-ray does show "white spots".  I had reviewed images and discussed with her.  Patterns show more of a fibrotic prominence, not "spots".  We considered CT but have decided to wait for a longer interval after CXR. CXR 07/31/22- IMPRESSION: Increased chronic appearing interstitial changes bilaterally. No acute infiltrate is seen.  ROS-see HPI   + = positive Constitutional:    weight loss, night sweats, fevers, chills, fatigue, lassitude. HEENT:    headaches, difficulty swallowing, tooth/dental problems, sore throat,       sneezing, itching, +ear ache, nasal congestion, post nasal drip, snoring CV:    chest pain, orthopnea, PND, swelling in lower extremities, anasarca,                   dizziness, palpitations Resp:   +shortness of breath with exertion or at rest.                +productive cough,   +non-productive cough, coughing up of  blood.              change in color of mucus.  wheezing.   Skin:    rash or lesions. GI:  No-   heartburn, indigestion, abdominal pain, nausea, vomiting, diarrhea,                 change in bowel habits, loss of appetite GU: dysuria, change in color of urine, no urgency or frequency.   flank pain. MS:   joint pain, stiffness, decreased range of motion, back pain. Neuro-     nothing unusual Psych:  change in mood or affect.  depression or anxiety.   memory loss.  OBJ- Physical Exam General- Alert, Oriented, Affect-appropriate, Distress- none acute Skin- rash-none, lesions- none, excoriation- none Lymphadenopathy- none Head- atraumatic            Eyes- Gross vision intact, PERRLA, conjunctivae and secretions clear            Ears- Hearing, canals-normal            Nose- Clear, no-Septal dev, mucus, polyps, erosion, perforation             Throat- Mallampati II , mucosa clear , drainage- none, tonsils- atrophic Neck- flexible , trachea midline, no stridor , thyroid nl, carotid no bruit Chest - symmetrical excursion , unlabored           Heart/CV- RRR , no  murmur , no gallop  , no rub, nl s1 s2                           - JVD- none , edema+, stasis changes- none, varices- none           Lung- clear to P&A, wheeze- none, cough- none , dullness-none, rub- none           Chest wall-  Abd-  Br/ Gen/ Rectal- Not done, not indicated Extrem- + 1+ sock line edema and superficial varices Neuro- grossly intact to observation  .

## 2022-08-10 ENCOUNTER — Ambulatory Visit (INDEPENDENT_AMBULATORY_CARE_PROVIDER_SITE_OTHER): Payer: Medicare Other | Admitting: Internal Medicine

## 2022-08-10 ENCOUNTER — Encounter: Payer: Self-pay | Admitting: Internal Medicine

## 2022-08-10 VITALS — BP 124/62 | HR 62 | Temp 98.2°F | Ht 63.0 in | Wt 127.8 lb

## 2022-08-10 DIAGNOSIS — R0609 Other forms of dyspnea: Secondary | ICD-10-CM

## 2022-08-10 DIAGNOSIS — I6523 Occlusion and stenosis of bilateral carotid arteries: Secondary | ICD-10-CM

## 2022-08-10 DIAGNOSIS — C3490 Malignant neoplasm of unspecified part of unspecified bronchus or lung: Secondary | ICD-10-CM

## 2022-08-10 DIAGNOSIS — J441 Chronic obstructive pulmonary disease with (acute) exacerbation: Secondary | ICD-10-CM

## 2022-08-10 LAB — BASIC METABOLIC PANEL
BUN: 10 mg/dL (ref 6–23)
CO2: 35 mEq/L — ABNORMAL HIGH (ref 19–32)
Calcium: 8.5 mg/dL (ref 8.4–10.5)
Chloride: 97 mEq/L (ref 96–112)
Creatinine, Ser: 0.9 mg/dL (ref 0.40–1.20)
GFR: 57.33 mL/min — ABNORMAL LOW (ref >60.00)
Glucose, Bld: 159 mg/dL — ABNORMAL HIGH (ref 70–99)
Potassium: 3.1 mEq/L — ABNORMAL LOW (ref 3.5–5.1)
Sodium: 137 mEq/L (ref 135–145)

## 2022-08-10 LAB — CBC WITH DIFFERENTIAL/PLATELET
Basophils Absolute: 0.2 10*3/uL — ABNORMAL HIGH (ref 0.0–0.1)
Basophils Relative: 1.7 % (ref 0.0–3.0)
Eosinophils Absolute: 0.1 10*3/uL (ref 0.0–0.7)
Eosinophils Relative: 1.4 % (ref 0.0–5.0)
HCT: 38.4 % (ref 36.0–46.0)
Hemoglobin: 12.5 g/dL (ref 12.0–15.0)
Lymphocytes Relative: 25.7 % (ref 12.0–46.0)
Lymphs Abs: 2.3 10*3/uL (ref 0.7–4.0)
MCHC: 32.6 g/dL (ref 30.0–36.0)
MCV: 96.2 fl (ref 78.0–100.0)
Monocytes Absolute: 0.8 10*3/uL (ref 0.1–1.0)
Monocytes Relative: 9 % (ref 3.0–12.0)
Neutro Abs: 5.6 10*3/uL (ref 1.4–7.7)
Neutrophils Relative %: 62.2 % (ref 43.0–77.0)
Platelets: 225 10*3/uL (ref 150.0–400.0)
RBC: 3.99 Mil/uL (ref 3.87–5.11)
RDW: 16.3 % — ABNORMAL HIGH (ref 11.5–15.5)
WBC: 9 10*3/uL (ref 4.0–10.5)

## 2022-08-10 LAB — D-DIMER, QUANTITATIVE: D-Dimer, Quant: 0.93 mcg/mL FEU — ABNORMAL HIGH (ref <0.50)

## 2022-08-10 MED ORDER — PREDNISONE 10 MG PO TABS: ORAL_TABLET | ORAL | 0 refills | Status: DC

## 2022-08-10 NOTE — Patient Instructions (Signed)
Script sent for prednisone taper  Order lab- CBC w diff, D-dimer, BMET     dx dyspnea on exertion  Order- DME [employer]- new compressor nebulizer machine   DX COPD mixed type                               Duoneb 360 ml   ref x 12    1 neb every 6 hours if needed  Please call if we can help

## 2022-08-10 NOTE — Assessment & Plan Note (Signed)
Subacute exacerbation of COPD related to viral URI is most likely. She was not tested for Covid. Exam is unimpressive now. PE seems unlikely, but will check D-dimer and get CT if abnl. Plan- home nebulizer with Duoneb, use Symbicort/ Spiriva OR neb, as explained. Repeat prednisone taper ("usually works")

## 2022-08-10 NOTE — Assessment & Plan Note (Signed)
No recurrence suspected. She raised question. Plan- consider update CT

## 2022-08-11 ENCOUNTER — Telehealth: Payer: Self-pay | Admitting: Pulmonary Disease

## 2022-08-11 DIAGNOSIS — R079 Chest pain, unspecified: Secondary | ICD-10-CM

## 2022-08-11 DIAGNOSIS — R899 Unspecified abnormal finding in specimens from other organs, systems and tissues: Secondary | ICD-10-CM

## 2022-08-11 NOTE — Progress Notes (Signed)
Called pt and there was no answer-LMTCB °

## 2022-08-11 NOTE — Telephone Encounter (Signed)
Called and spoke to patient about lab test results. She verbalized understanding of the lab results and the next steps with the testing. I did advise her that someone will be calling to get the scan scheduled with her.   Can we call this lady to get the CTA ordered for her due to elevated D Dimer.  Thank you   Please advise

## 2022-08-12 ENCOUNTER — Other Ambulatory Visit: Payer: Self-pay

## 2022-08-12 ENCOUNTER — Ambulatory Visit (HOSPITAL_COMMUNITY)
Admission: RE | Admit: 2022-08-12 | Discharge: 2022-08-12 | Disposition: A | Discharge status: Home / Self Care | Payer: Medicare Other | Source: Ambulatory Visit | Attending: Internal Medicine | Admitting: Internal Medicine

## 2022-08-12 DIAGNOSIS — R899 Unspecified abnormal finding in specimens from other organs, systems and tissues: Secondary | ICD-10-CM | POA: Diagnosis not present

## 2022-08-12 DIAGNOSIS — R079 Chest pain, unspecified: Secondary | ICD-10-CM | POA: Diagnosis not present

## 2022-08-12 DIAGNOSIS — R7989 Other specified abnormal findings of blood chemistry: Secondary | ICD-10-CM | POA: Diagnosis not present

## 2022-08-12 MED ORDER — LEVOTHYROXINE SODIUM 88 MCG PO TABS: 88.0000 ug | ORAL_TABLET | Freq: Every day | ORAL | 1 refills | Status: AC

## 2022-08-12 MED ORDER — IOHEXOL 350 MG/ML SOLN
75.0000 mL | Freq: Once | INTRAVENOUS | Status: AC | PRN
  Administered 2022-08-12: 75 mL via INTRAVENOUS

## 2022-08-12 NOTE — Telephone Encounter (Signed)
Pt has been scheduled for this afternoon.  I spoke to her and gave her appt info.  Nothing further needed.

## 2022-08-17 ENCOUNTER — Other Ambulatory Visit: Payer: Self-pay | Admitting: Nurse Practitioner

## 2022-08-17 ENCOUNTER — Telehealth: Payer: Self-pay

## 2022-08-17 DIAGNOSIS — N760 Acute vaginitis: Secondary | ICD-10-CM

## 2022-08-17 MED ORDER — FLUCONAZOLE 150 MG PO TABS
150.0000 mg | ORAL_TABLET | ORAL | 0 refills | Status: DC
Start: 2022-08-17 — End: 2022-08-19

## 2022-08-17 NOTE — Telephone Encounter (Signed)
Spoke with patient and informed her. °

## 2022-08-17 NOTE — Telephone Encounter (Signed)
Yes, I will send Diflucan to her pharmacy.

## 2022-08-17 NOTE — Telephone Encounter (Signed)
Error below. I did not speak with patient but left message on her ans machine per DPR access note on file.

## 2022-08-17 NOTE — Telephone Encounter (Signed)
Has appointment with Wende Crease, NP on [place] this week for pessary check.  She called because x 3 days she is having external burning and itching. She said she is pretty uncomfortable and wondered if something you could recommend or prescribe to bring her some relied until her visit.

## 2022-08-18 ENCOUNTER — Ambulatory Visit: Payer: Medicare Other | Admitting: Primary Care

## 2022-08-19 ENCOUNTER — Encounter: Payer: Self-pay | Admitting: Radiology

## 2022-08-19 ENCOUNTER — Ambulatory Visit (INDEPENDENT_AMBULATORY_CARE_PROVIDER_SITE_OTHER): Payer: Medicare Other | Admitting: Radiology

## 2022-08-19 VITALS — BP 110/68

## 2022-08-19 DIAGNOSIS — N760 Acute vaginitis: Secondary | ICD-10-CM | POA: Diagnosis not present

## 2022-08-19 DIAGNOSIS — N3091 Cystitis, unspecified with hematuria: Secondary | ICD-10-CM | POA: Diagnosis not present

## 2022-08-19 DIAGNOSIS — R300 Dysuria: Secondary | ICD-10-CM

## 2022-08-19 DIAGNOSIS — N814 Uterovaginal prolapse, unspecified: Secondary | ICD-10-CM | POA: Diagnosis not present

## 2022-08-19 DIAGNOSIS — N958 Other specified menopausal and perimenopausal disorders: Secondary | ICD-10-CM | POA: Diagnosis not present

## 2022-08-19 DIAGNOSIS — N3090 Cystitis, unspecified without hematuria: Secondary | ICD-10-CM

## 2022-08-19 DIAGNOSIS — B9689 Other specified bacterial agents as the cause of diseases classified elsewhere: Secondary | ICD-10-CM

## 2022-08-19 LAB — WET PREP FOR TRICH, YEAST, CLUE

## 2022-08-19 MED ORDER — ESTRADIOL 0.1 MG/GM VA CREA: 1.0000 g | TOPICAL_CREAM | VAGINAL | 6 refills | Status: AC

## 2022-08-19 MED ORDER — TRIAMCINOLONE ACETONIDE 0.1 % EX OINT: 1.0000 | TOPICAL_OINTMENT | Freq: Two times a day (BID) | CUTANEOUS | 3 refills | Status: AC | PRN

## 2022-08-19 MED ORDER — METRONIDAZOLE 500 MG PO TABS: 500.0000 mg | ORAL_TABLET | Freq: Two times a day (BID) | ORAL | 0 refills | Status: DC

## 2022-08-19 MED ORDER — FLUCONAZOLE 150 MG PO TABS: 150.0000 mg | ORAL_TABLET | ORAL | 0 refills | Status: DC

## 2022-08-19 MED ORDER — AMOXICILLIN-POT CLAVULANATE 500-125 MG PO TABS: 1.0000 | ORAL_TABLET | Freq: Two times a day (BID) | ORAL | 0 refills | Status: DC

## 2022-08-19 NOTE — Progress Notes (Signed)
Lisa Vega 12/25/1932 [id number]   History: Postmenopausal 88 y.o. Here for pessary cleaning.  Complains of external vaginal irritation (cortisone 10 some relief), burning with urination, leaks urine daily.  Past Medical History:  Diagnosis Date   Anxiety    Arthritis    RA   BPV (benign positional vertigo) 03/10/2018   Cancer ([place])    lung ca   Carotid stenosis, left 09/19/2018   COPD (chronic obstructive pulmonary disease) (HCC)    Depression    Dyspnea    with exertion   GERD (gastroesophageal reflux disease)    Headache syndrome 03/09/2019   Hypercholesteremia    Hypothyroidism    Memory difficulties 10/28/2015   Pneumonia     x3  last time 2017   Pre-diabetes    Rheumatoid arthritis(714.0)    Thyroid disease    Tremor 10/28/2015   Jaw tremor   Uterine prolapse    Vitamin D deficiency      Obstetric History OB History  Gravida Para Term Preterm AB Living  5 3     2 3   SAB IAB Ectopic Multiple Live Births  1   1        # Outcome Date GA Lbr Len/2nd Weight Sex Delivery Anes PTL Lv  5 Ectopic           4 SAB           3 Para           2 Para           1 Para              The following portions of the patient's history were reviewed and updated as appropriate: allergies, current medications, past family history, past medical history, past social history, past surgical history, and problem list.  Review of Systems Pertinent items noted in HPI and remainder of comprehensive ROS otherwise negative.  Past medical history, past surgical history, family history and social history were all reviewed and documented in the EPIC chart.  Exam:  Vitals:   08/19/22 1405  BP: 110/68   There is no height or weight on file to calculate BMI.  General appearance:  Normal Genitourinary   Inguinal/mons:  Normal without inguinal adenopathy  External genitalia:  punctuated rash on inner groin/crease  BUS/Urethra/Skene's glands:  Normal  Vagina:  + green d/c, wet  prep sample obtained #8 shaatz (retrofitted from [place]) pessary removed with ring forceps, cleaned and replaced. Scant bleeding noticed at introitus. Moderate atrophy of the vagina  Microscopic wet-mount exam shows clue cells. Urine dipstick shows positive for RBC's, positive for protein, positive for leukocytes, and positive for ketones.  Micro exam: 40-60 WBC's per HPF, 3-10 RBC's per HPF, and many+ bacteria.     Lisa Vega, CMA present for exam  Assessment/Plan:   1. Cystocele with uterine prolapse Pessary cleaned and replaced  2. Dysuria - Urinalysis,Complete w/RFL Culture  3. Acute vaginitis - WET PREP FOR TRICH, YEAST, CLUE - fluconazole (DIFLUCAN) 150 MG tablet; Take 1 tablet (150 mg total) by mouth every 3 (three) days.  Dispense: 3 tablet; Refill: 0 - triamcinolone ointment (KENALOG) 0.1 %; Apply 1 Application topically 2 (two) times daily as needed.  Dispense: 30 g; Refill: 3  4. Bacterial vaginosis - metroNIDAZOLE (FLAGYL) 500 MG tablet; Take 1 tablet (500 mg total) by mouth 2 (two) times daily.  Dispense: 14 tablet; Refill: 0  5. Cystitis with hematuria  - amoxicillin-clavulanate (AUGMENTIN) 500-125 MG tablet;  Take 1 tablet (500 mg total) by mouth 2 (two) times daily before a meal.  Dispense: 14 tablet; Refill: 0  6. Genitourinary syndrome of menopause  - estradiol (ESTRACE) 0.1 MG/GM vaginal cream; Place 1 g vaginally 3 (three) times a week.  Dispense: 42.5 g; Refill: 6   Lisa Vega WHNP-BC, 2:36 PM 08/19/2022

## 2022-08-21 ENCOUNTER — Other Ambulatory Visit: Payer: Self-pay

## 2022-08-21 DIAGNOSIS — Z961 Presence of intraocular lens: Secondary | ICD-10-CM | POA: Diagnosis not present

## 2022-08-21 DIAGNOSIS — J219 Acute bronchiolitis, unspecified: Secondary | ICD-10-CM

## 2022-08-21 DIAGNOSIS — L718 Other rosacea: Secondary | ICD-10-CM | POA: Diagnosis not present

## 2022-08-21 DIAGNOSIS — H02403 Unspecified ptosis of bilateral eyelids: Secondary | ICD-10-CM | POA: Diagnosis not present

## 2022-08-21 DIAGNOSIS — J449 Chronic obstructive pulmonary disease, unspecified: Secondary | ICD-10-CM

## 2022-08-21 DIAGNOSIS — H10013 Acute follicular conjunctivitis, bilateral: Secondary | ICD-10-CM | POA: Diagnosis not present

## 2022-08-21 DIAGNOSIS — H18513 Endothelial corneal dystrophy, bilateral: Secondary | ICD-10-CM | POA: Diagnosis not present

## 2022-08-21 LAB — URINALYSIS, COMPLETE W/RFL CULTURE
Bilirubin Urine: NEGATIVE
Casts: NONE SEEN /LPF
Crystals: NONE SEEN /HPF
Glucose, UA: NEGATIVE
Hyaline Cast: NONE SEEN /LPF
Nitrites, Initial: NEGATIVE
Specific Gravity, Urine: 1.01 (ref 1.001–1.035)
Yeast: NONE SEEN /HPF
pH: 6 (ref 5.0–8.0)

## 2022-08-21 LAB — CULTURE INDICATED

## 2022-08-21 LAB — URINE CULTURE
MICRO NUMBER:: 13820383
SPECIMEN QUALITY:: ADEQUATE

## 2022-08-21 NOTE — Progress Notes (Unsigned)
Office Visit Note  Patient: Lisa Vega             Date of Birth: 1932/07/19           MRN: [id number]             PCP: Binnie Rail, MD Referring: Binnie Rail, MD Visit Date: 08/26/2022   Subjective:  No chief complaint on file.   History of Present Illness: Lisa Vega is a 88 y.o. female here for follow up ***   Previous HPI Lisa Vega is a 88 y.o. female here for follow up for seropositive RA on MTX 17.5 mg [place] weekly and folic acid 1 mg daily.  She had recent bilateral knee steroid injections with D.r Nelva Bush which are very helpful. Her left shoulder continues to be moderately painful she takes some ibuprofen when it is very bad. She is currently wearing a heart monitor for arrhythmia. Her blood pressure is lower than normal she does feel slightly lightheaded but not particularly orthostatic symptoms no new swelling or dyspnea.   Previous HPI 10/20/21 Lisa Vega is a 88 y.o. female here for follow up for erosive RA on methotrexate 17.5 mg [place] weekly. Since our last visit she has significant ongoing joint pain but no severe or unbearable flare up. She has been seeing Dr. Nelva Bush for many years with good improvement to intraarticular injections and feels like it is about time, her left knee pain is acting up. She had at least one episode of right wrist swelling and erythema about a month ago. She has some urinary frequency problems evaluated recently for UTI this appeared to be negative, old pessary was recently removed and she is gong to see urology/gynecology new office tomorrow.    Previous HPI 12/06/20 History of Present Illness: Lisa Vega is a 88 y.o. female with a history of non-small cell lung cancer status post surgical resection and chemotherapy, hypothyroidism, osteoporosis, and COPD here to establish care for rheumatoid arthritis currently treated with methotrexate subcutaneous 17.5 mg weekly.  She was originally diagnosed around 40 years ago in  [place] she to treatment with gold injections that were not effective.  She later treated with oral methotrexate that was poorly tolerated with rashes at even low-dose.  She was switched to subcutaneous methotrexate which she has been taking for at least 20 years.  Since moving to this area she is seeing rheumatology care with Dr. Justine Null, Dr. Rockwell [place], Dr. Trudie Reed, and now seeks to transfer care for an office that is closer to her daughter's home where she has started living due to increased difficulties with independent living and commuting.   She has extensive long-term damage from her rheumatoid arthritis with subluxation deviation and deformity of the digits on both hands and both feet.  She is also had some involvement of the shoulders and wrists but not as severe.  She has degenerative back disease, cervical disc disease, and bilateral osteoarthritis of the knees.  The trouble with her knees especially with ulcer and deformity of the right foot significantly limit her mobility at this time.  She has not noticed a lot of joint swelling redness or heat recently and has not required recent steroid treatments.  She has had some benefit with intra-articular injections for the knees also had some injections for the neck and upper back with mixed results.  She also describes some nodules in the skin of her upper arms and on her hands that she thinks  are more noticeable over the past several months.   She has a history of non-small cell lung cancer that was found with a rapidly enlarging left lung nodule.  She was a former cigarette smoker with COPD but no longer.  She denies a known history of interstitial lung disease but has discussed difficulty say whether methotrexate has contributed a lung toxicity versus just her RA.     No Rheumatology ROS completed.   PMFS History:  Patient Active Problem List   Diagnosis Date Noted   Sinusitis 02/09/2022   Acute respiratory failure ([place]) 01/29/2022   Pain in left  shoulder 01/20/2022   Pain of left hip joint 01/12/2022   Stress incontinence 11/07/2021   Urinary frequency 10/20/2021   Arthralgia of lower leg 02/24/2021   On long term drug therapy 02/24/2021   Myalgia 02/24/2021   Anxiety state 02/24/2021   Diverticular disease of colon 02/24/2021   Dysphagia 02/24/2021   Essential tremor 02/24/2021   History of colonic polyps 02/24/2021   Mixed hyperlipidemia 02/24/2021   Vitamin B12 deficiency 02/24/2021   Vitamin D deficiency 02/24/2021   Slow transit constipation 02/24/2021   COVID-19 01/28/2021   Headache, cervicogenic 01/03/2021   High risk medication use 12/06/2020   Rosacea 11/29/2020   Cervical spondylosis 04/18/2020   Primary osteoarthritis of first carpometacarpal joint of right hand 05/10/2019   Headache syndrome 03/09/2019   Carotid stenosis, left 09/19/2018   Osteoarthritis of left knee 05/12/2018   Benign positional vertigo 03/10/2018   History of lung cancer 11/29/2017   Difficulty sleeping 09/22/2017   Diabetes ([place]) 05/13/2017   Fatigue 05/12/2017   CAP (community acquired pneumonia) 05/03/2017   Orthopnea 01/12/2017   GERD (gastroesophageal reflux disease) 11/09/2016   Osteoporosis 05/06/2016   Depression 05/06/2016   Hiatal hernia 05/06/2016   Hypothyroidism 05/06/2016   Tremor 10/28/2015   Memory difficulties 10/28/2015   COPD exacerbation ([place]) 01/08/2015   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 ([place]) 05/30/2014   Diverticulitis of colon without hemorrhage 08/07/2013   Prolapse of female pelvic organs 06/22/2013   Neck pain 03/10/2013   Vaginal atrophy 03/09/2013   Rectocele 03/09/2013   Cystocele 03/09/2013   Urinary incontinence 03/09/2013   COPD (chronic obstructive pulmonary disease) ([place]) 01/16/2011   Malignant neoplasm of bronchus and lung ([place]) 12/17/2010   Rheumatoid arthritis ([place]) 12/17/2010   Dyspnea on exertion 12/17/2010    Past Medical History:  Diagnosis Date   Anxiety     Arthritis    RA   BPV (benign positional vertigo) 03/10/2018   Cancer ([place])    lung ca   Carotid stenosis, left 09/19/2018   COPD (chronic obstructive pulmonary disease) (HCC)    Depression    Dyspnea    with exertion   GERD (gastroesophageal reflux disease)    Headache syndrome 03/09/2019   Hypercholesteremia    Hypothyroidism    Memory difficulties 10/28/2015   Pneumonia     x3  last time 2017   Pre-diabetes    Rheumatoid arthritis(714.0)    Thyroid disease    Tremor 10/28/2015   Jaw tremor   Uterine prolapse    Vitamin D deficiency     Family History  Problem Relation Age of Onset   Emphysema Mother    Asthma Mother    Lung cancer Father    Pancreatic cancer Father    Bone cancer Father    Heart disease Brother        x1   Diabetes Brother  Hypertension Brother    Lupus Brother    Heart disease Sister        x2   Diabetes Sister    Hypertension Sister    Diabetes Sister    Hypertension Sister    Diabetes Brother    Hypertension Brother    Stroke Maternal Grandfather    Hypertension Son    Tremor Son    Past Surgical History:  Procedure Laterality Date   ANGIOPLASTY Left 10/10/2018   Procedure: ANGIOPLASTY using 1cm x 14cm Xenosure Patch;  Surgeon: Marty Heck, MD;  Location: [facility] OR;  Service: Vascular;  Laterality: Left;   APPENDECTOMY     COLONOSCOPY W/ POLYPECTOMY     ENDARTERECTOMY Left 10/10/2018   Procedure: ENDARTERECTOMY CAROTID;  Surgeon: Marty Heck, MD;  Location: [facility] OR;  Service: Vascular;  Laterality: Left;   EYE SURGERY Bilateral    cataract   FOOT SURGERY Bilateral    arthritis   Hands  Bilateral    Arthritis   HEMORRHOID SURGERY     right lobectomy  10/2004   TONSILLECTOMY AND ADENOIDECTOMY     Social History   Social History Narrative   09/09/21 lives with dgtr Lovey Newcomer; widowed   Patient drinks about 3 cups of coffee daily.   Patient is right handed.    Retired Corporate treasurer at [employer]  History  Administered Date(s) Administered   Fluad Quad(high Dose 65+) 12/08/2019, 10/31/2020   Influenza Split 10/18/2009, 09/28/2011, 10/28/2011, 09/28/2015, 10/09/2015   Influenza Whole 09/27/2012   Influenza, High Dose Seasonal PF 10/28/2016, 01/12/2017, 09/22/2017, 10/03/2018   Influenza,inj,Quad PF,6+ Mos 09/20/2013, 09/12/2014   Pneumococcal Conjugate-13 10/24/2015, 10/31/2020   Pneumococcal Polysaccharide-23 12/29/2007, 07/10/2008     Objective: Vital Signs: There were no vitals taken for this visit.   Physical Exam   Musculoskeletal Exam: ***  CDAI Exam: CDAI Score: -- Patient Global: --; Provider Global: -- Swollen: --; Tender: -- Joint Exam 08/26/2022   No joint exam has been documented for this visit   There is currently no information documented on the homunculus. Go to the Rheumatology activity and complete the homunculus joint exam.  Investigation: No additional findings.  Imaging: CT Angio Chest Pulmonary Embolism (PE) W or WO Contrast  Result Date: 08/12/2022 CLINICAL DATA:  Positive D-dimer. Chest pain. History of right lower and right middle lobectomy for lung cancer. EXAM: CT ANGIOGRAPHY CHEST WITH CONTRAST TECHNIQUE: Multidetector CT imaging of the chest was performed using the standard protocol during bolus administration of intravenous contrast. Multiplanar CT image reconstructions and MIPs were obtained to evaluate the vascular anatomy. RADIATION DOSE REDUCTION: This exam was performed according to the departmental dose-optimization program which includes automated exposure control, adjustment of the mA and/or kV according to patient size and/or use of iterative reconstruction technique. CONTRAST:  65mL OMNIPAQUE IOHEXOL 350 MG/ML SOLN COMPARISON:  CT chest 09/26/2020 FINDINGS: Cardiovascular: Satisfactory opacification of the pulmonary arteries to the segmental level. No evidence of pulmonary embolism. The heart is mildly enlarged. There are atherosclerotic  calcifications of the aorta and coronary arteries. Mediastinum/Nodes: No enlarged mediastinal, hilar, or axillary lymph nodes. Thyroid gland, trachea, and esophagus demonstrate no significant findings. Lungs/Pleura: Chronic scarring or atelectasis in the bilateral lung apices and lingula are unchanged. Patient is status post right middle lobe and right lower lobe resection, unchanged. Patchy airspace opacities in the lung base on the right with adjacent pleural thickening and calcifications appears chronic and unchanged. There are new tree-in-bud opacities in the  inferior aspect of the right upper lobe/inferior right hemithorax with some new patchy airspace opacities as well. No pleural effusion or pneumothorax. Trachea and central airways are patent. Upper Abdomen: No acute abnormality. Musculoskeletal: There are degenerative changes of the lower thoracic spine. No acute fractures. Review of the MIP images confirms the above findings. IMPRESSION: 1. No evidence for pulmonary embolism. 2. New tree-in-bud opacities and patchy airspace opacities in the inferior right hemithorax (inferior aspect of the right upper lobe) most compatible with infection. Follow-up chest CT recommended in 3 months to confirm resolution. 3. Right middle lobe/right lower lobe resection changes are again noted with stable areas of scarring bilaterally. 4.  Aortic Atherosclerosis (ICD10-I70.0). 5. Mild cardiomegaly Electronically Signed   By: Ronney Asters M.D.   On: 08/12/2022 17:38   DG Chest 2 View  Result Date: 07/31/2022 CLINICAL DATA:  Cough and shortness of breath EXAM: CHEST - 2 VIEW COMPARISON:  09/13/2020 FINDINGS: Cardiac shadow is stable. Elevation the right hemidiaphragm is again seen and stable. Increased interstitial changes are noted when compare with the prior exam likely related to scarring. No focal infiltrate or effusion is seen. No bony abnormality is noted. IMPRESSION: Increased chronic appearing interstitial changes  bilaterally. No acute infiltrate is seen. Electronically Signed   By: Inez Catalina M.D.   On: 07/31/2022 16:24    Recent Labs: Lab Results  Component Value Date   WBC 9.0 08/10/2022   HGB 12.5 08/10/2022   PLT 225.0 08/10/2022   NA 137 08/10/2022   K 3.1 (L) 08/10/2022   CL 97 08/10/2022   CO2 35 (H) 08/10/2022   GLUCOSE 159 (H) 08/10/2022   BUN 10 08/10/2022   CREATININE 0.90 08/10/2022   BILITOT 0.8 01/20/2022   ALKPHOS 87 12/02/2021   AST 30 01/20/2022   ALT 31 (H) 01/20/2022   PROT 6.9 01/20/2022   ALBUMIN 3.7 12/02/2021   CALCIUM 8.5 08/10/2022   GFRAA 63 06/25/2021    Speciality Comments: No specialty comments available.  Procedures:  No procedures performed Allergies: Codeine, Meperidine hcl, Demerol, Escitalopram, Meperidine hcl, Other, and Sulfa antibiotics   Assessment / Plan:     Visit Diagnoses: No diagnosis found.  ***  Orders: No orders of the defined types were placed in this encounter.  No orders of the defined types were placed in this encounter.    Follow-Up Instructions: No follow-ups on file.   Bertram Savin, RT  Note - This record has been created using Editor, commissioning.  Chart creation errors have been sought, but may not always  have been located. Such creation errors do not reflect on  the standard of medical care.

## 2022-08-26 ENCOUNTER — Encounter: Payer: Self-pay | Admitting: Internal Medicine

## 2022-08-26 ENCOUNTER — Ambulatory Visit: Payer: Medicare Other | Attending: Internal Medicine | Admitting: Internal Medicine

## 2022-08-26 VITALS — BP 120/75 | HR 67 | Resp 15 | Ht 63.0 in | Wt 129.6 lb

## 2022-08-26 DIAGNOSIS — M069 Rheumatoid arthritis, unspecified: Secondary | ICD-10-CM | POA: Insufficient documentation

## 2022-08-26 DIAGNOSIS — M25512 Pain in left shoulder: Secondary | ICD-10-CM | POA: Insufficient documentation

## 2022-08-26 DIAGNOSIS — M1712 Unilateral primary osteoarthritis, left knee: Secondary | ICD-10-CM | POA: Insufficient documentation

## 2022-08-26 DIAGNOSIS — Z79899 Other long term (current) drug therapy: Secondary | ICD-10-CM | POA: Diagnosis not present

## 2022-08-26 DIAGNOSIS — G8929 Other chronic pain: Secondary | ICD-10-CM | POA: Insufficient documentation

## 2022-08-27 LAB — HEPATIC FUNCTION PANEL
AG Ratio: 1.2 (calc) (ref 1.0–2.5)
ALT: 15 U/L (ref 6–29)
AST: 29 U/L (ref 10–35)
Albumin: 3.4 g/dL — ABNORMAL LOW (ref 3.6–5.1)
Alkaline phosphatase (APISO): 87 U/L (ref 37–153)
Bilirubin, Direct: 0.1 mg/dL (ref 0.0–0.2)
Globulin: 2.9 g/dL (calc) (ref 1.9–3.7)
Indirect Bilirubin: 0.3 mg/dL (calc) (ref 0.2–1.2)
Total Bilirubin: 0.4 mg/dL (ref 0.2–1.2)
Total Protein: 6.3 g/dL (ref 6.1–8.1)

## 2022-08-27 LAB — C-REACTIVE PROTEIN: CRP: 2.8 mg/L (ref <8.0)

## 2022-08-27 LAB — SEDIMENTATION RATE: Sed Rate: 43 mm/h — ABNORMAL HIGH (ref 0–30)

## 2022-08-27 NOTE — Progress Notes (Signed)
Liver function test looks fine for continuing methotrexate.  Her sedimentation rate is increased to 43 this was previously normal last year and slightly increased at 33 earlier this year.  Could be related to still recovering from recent medical exacerbations, since the CRP which usually adjusts faster to changes in inflammation is back to normal.

## 2022-09-09 NOTE — Progress Notes (Deleted)
08/10/22- 4 yoF former smoker (20 pk yrs) for acute visit Medical problem list includes Hx NSSCaLung/ RUL lobectomy, CHF, COPD, Diverticulosis, GERD, DM, Hypothyroid, Hyperparathyroid, Rheumatoid Arthritis, Hx Coid Infection, Carotid Stenosis,  O2 2L/Adapt- sleep and POC Seen 8/4 for Acute Visit- exacerb COPD, by Dr Dewald>> Zpak, prednisone. Meds- Proair hfa, Symbicort 160, MTX inj, Spiriva 2.5,  Here with daughter.  Says prednisone and Z-Pak last week made no difference.  Onset of this illness was as I infection 3 weeks ago.  Some wheeze.  Cough productive scant green especially after using Symbicort.  Left ear ache.  Main complaint is tightness with some air hunger, shallow breathing and increased dyspnea on exertion.  No chest pain or palpitation.  Denies edema or pain in the legs.  Long car ride about 3 weeks ago.  We discussed possibility of blood clot.  Has home oxygen and portable concentrator at 2 L.  Instruction was to sleep with it but she uses it only occasionally.   She was concerned with history of lung cancer, that she understood her most recent chest x-ray does show "white spots".  I had reviewed images and discussed with her.  Patterns show more of a fibrotic prominence, not "spots".  We considered CT but have decided to wait for a longer interval after CXR. CXR 07/31/22- IMPRESSION: Increased chronic appearing interstitial changes bilaterally. No acute infiltrate is seen.  09/10/22- 41 yoF former smoker (20 pk yrs) followed for COPD, complicated by Hx NSSCaLung/ RUL lobectomy, CHF, COPD, Diverticulosis, GERD, DM, Hypothyroid, Hyperparathyroid, Rheumatoid Arthritis/ MTX, Hx Covid Infection, Carotid Stenosis,  O2 2L/Adapt- sleep and POC -Proair hfa, Symbicort 160, Spiriva 2.5,  CT chest is scheduled for 11/6  CTaPE 08/12/22- IMPRESSION: 1. No evidence for pulmonary embolism. 2. New tree-in-bud opacities and patchy airspace opacities in the inferior right hemithorax (inferior aspect  of the right upper lobe) most compatible with infection. Follow-up chest CT recommended in 3 months to confirm resolution. 3. Right middle lobe/right lower lobe resection changes are again noted with stable areas of scarring bilaterally. 4.  Aortic Atherosclerosis (ICD10-I70.0). 5. Mild cardiomegaly   ROS-see HPI   + = positive Constitutional:    weight loss, night sweats, fevers, chills, fatigue, lassitude. HEENT:    headaches, difficulty swallowing, tooth/dental problems, sore throat,       sneezing, itching, +ear ache, nasal congestion, post nasal drip, snoring CV:    chest pain, orthopnea, PND, swelling in lower extremities, anasarca,                   dizziness, palpitations Resp:   +shortness of breath with exertion or at rest.                +productive cough,   +non-productive cough, coughing up of blood.              change in color of mucus.  wheezing.   Skin:    rash or lesions. GI:  No-   heartburn, indigestion, abdominal pain, nausea, vomiting, diarrhea,                 change in bowel habits, loss of appetite GU: dysuria, change in color of urine, no urgency or frequency.   flank pain. MS:   joint pain, stiffness, decreased range of motion, back pain. Neuro-     nothing unusual Psych:  change in mood or affect.  depression or anxiety.   memory loss.  OBJ- Physical Exam General- Alert, Oriented, Affect-appropriate,  Distress- none acute Skin- rash-none, lesions- none, excoriation- none Lymphadenopathy- none Head- atraumatic            Eyes- Gross vision intact, PERRLA, conjunctivae and secretions clear            Ears- Hearing, canals-normal            Nose- Clear, no-Septal dev, mucus, polyps, erosion, perforation             Throat- Mallampati II , mucosa clear , drainage- none, tonsils- atrophic Neck- flexible , trachea midline, no stridor , thyroid nl, carotid no bruit Chest - symmetrical excursion , unlabored           Heart/CV- RRR , no murmur , no gallop  , no  rub, nl s1 s2                           - JVD- none , edema+, stasis changes- none, varices- none           Lung- clear to P&A, wheeze- none, cough- none , dullness-none, rub- none           Chest wall-  Abd-  Br/ Gen/ Rectal- Not done, not indicated Extrem- + 1+ sock line edema and superficial varices Neuro- grossly intact to observation  .

## 2022-09-10 ENCOUNTER — Ambulatory Visit: Payer: Medicare Other | Admitting: Internal Medicine

## 2022-09-10 NOTE — Progress Notes (Deleted)
PATIENT: Lisa Vega DOB: [birth year]  REASON FOR VISIT: Follow up for positional vertigo, chronic gait disorder, chronic daily headache HISTORY FROM: Patient, daughter  PRIMARY NEUROLOGIST: Dr. Jaynee Vega   HISTORY OF PRESENT ILLNESS: Today 09/10/22   Update 03/10/22 SS: Ms. Lisa Vega here today for follow-up.  Started tizanidine at last visit for daily headache.  Is on gabapentin. Headaches continue, burning to top of head, then develops "pain" to frontal area and temples. Will lay down anywhere 5 minutes to 2 hours. Happens 2-3 days a week. The pain process is no longer than 30 minutes, doesn't take any medication. Some nausea, sensitivity to light. Claims tizanidine didn't help, made her sick. On gabapentin 100 mg 3 times daily, not sure if helping, doesn't want any higher doses. Has Tramadol from Dr. Nelva Vega if needed for orthopedic pain. Vertigo is doing well right now. The epley maneuvers hurt her neck to do routinely.lives with her daughter, Lisa Vega. Has seen cardiology, planning for stress test, HR's in the 30's. Consider cardiac cath.   HISTORY  09/09/2021 Dr. Jannifer Vega: Ms. Lisa Vega is an 88 year old right-handed white female with a history of rheumatoid arthritis.  The patient has had chronic issues with positional vertigo that is activated by laying on her right side.  She we will temporarily gain benefit with vestibular rehabilitation.  She was given a sheet delineating the Epley maneuvers, but she has not been doing these on a regular basis at home.  She has a baseline chronic issue with gait instability.  She continues to have her daily headache issues with burning sensations that may migrate about the top and the sides and back of the head with occasional pressure sensation behind the eyes.  The patient may take ibuprofen at times for the headache.  She has been tried on duloxetine, gabapentin, Lyrica, and baclofen without benefit or tolerance.  The daughter who comes with her today  recalls that while she was on duloxetine that she did not complain of the headaches as much.  The patient does have some neck pain off and on but she does not clearly correlate the neck stiffness and discomfort with the headache.  She has had occasional falls, she fractured her left hip 4 months ago.  She does not use a cane or a walker when she is outside the house.  REVIEW OF SYSTEMS: Out of a complete 14 system review of symptoms, the patient complains only of the following symptoms, and all other reviewed systems are negative.  See HPI  ALLERGIES: Allergies  Allergen Reactions   Codeine Other (See Comments)    REACTION: hallucinations   Meperidine Hcl Nausea And Vomiting    REACTION: severe GI upset   Demerol Other (See Comments)   Escitalopram Other (See Comments)    Other reaction(s): memory issues   Meperidine Hcl Other (See Comments)   Other Other (See Comments)   Sulfa Antibiotics Rash    HOME MEDICATIONS: Outpatient Medications Prior to Visit  Medication Sig Dispense Refill   acetaminophen (TYLENOL) 325 MG tablet Take 650 mg by mouth every 6 (six) hours as needed for mild pain.     albuterol (PROAIR HFA) 108 (90 Base) MCG/ACT inhaler Inhale 2 puffs into the lungs every 6 (six) hours as needed for wheezing or shortness of breath. 8.5 g 5   ALPRAZolam (XANAX) 0.25 MG tablet TAKE 1 TABLET BY MOUTH EVERY DAY AS NEEDED FOR ANXIETY 30 tablet 2   amoxicillin-clavulanate (AUGMENTIN) 500-125 MG tablet Take 1 tablet (  500 mg total) by mouth 2 (two) times daily before a meal. 14 tablet 0   aspirin EC 81 MG tablet Take 81 mg by mouth daily.     B-D TB SYRINGE 1CC/27GX1/2" 27G X 1/2" 1 ML MISC For use with methotrexate injection weekly 25 each 1   budesonide-formoterol (SYMBICORT) 160-4.5 MCG/ACT inhaler USE 2 INHALATIONS ORALLY   TWICE DAILY 30.6 g 2   clidinium-chlordiazePOXIDE (LIBRAX) 5-2.5 MG capsule SMARTSIG:1 Capsule(s) By Mouth Every 12 Hours PRN     estradiol (ESTRACE) 0.1 MG/GM  vaginal cream Place 1 g vaginally 3 (three) times a week. 42.5 g 6   famotidine (PEPCID) 40 MG tablet TAKE 1 TABLET DAILY 90 tablet 3   fluconazole (DIFLUCAN) 150 MG tablet Take 1 tablet (150 mg total) by mouth every 3 (three) days. 3 tablet 0   folic acid (FOLVITE) 1 MG tablet Take 1 tablet (1 mg total) by mouth daily. 90 tablet 1   gabapentin (NEURONTIN) 100 MG capsule Take 1 capsule (100 mg total) by mouth 3 (three) times daily. 270 capsule 3   Galcanezumab-gnlm (EMGALITY) 120 MG/ML SOAJ Inject 120 mg into the skin every 30 (thirty) days. (Patient not taking: Reported on 08/19/2022) 1.12 mL 11   ibuprofen (ADVIL,MOTRIN) 200 MG tablet Take 200 mg by mouth every 6 (six) hours as needed for headache or mild pain.     levothyroxine (SYNTHROID) 88 MCG tablet Take 1 tablet (88 mcg total) by mouth daily. 90 tablet 1   losartan (COZAAR) 25 MG tablet Take 1 tablet (25 mg total) by mouth daily. 90 tablet 2   meclizine (ANTIVERT) 25 MG tablet Take 0.5-1 tablets (12.5-25 mg total) by mouth 3 (three) times daily as needed for dizziness. 60 tablet 5   Methotrexate Sodium (METHOTREXATE, PF,) 50 MG/2ML injection INJECT 0.7 MLS (17.5 MG TOTAL) INTO THE SKIN ONCE A WEEK. 4 mL 2   metroNIDAZOLE (FLAGYL) 500 MG tablet Take 1 tablet (500 mg total) by mouth 2 (two) times daily. 14 tablet 0   metroNIDAZOLE (METROGEL) 0.75 % gel Apply topically in the morning, at noon, and at bedtime.     mupirocin ointment (BACTROBAN) 2 % Place 1 application into the nose 2 (two) times daily. 22 g 1   omeprazole (PRILOSEC) 40 MG capsule Take 40 mg by mouth daily.     ONETOUCH DELICA LANCETS 71I MISC USE TO CHECK SUGARS UP TO 4 TIMES A DAY  0   ONETOUCH ULTRA test strip USE TO CHECK BLOOD SUGARS TWICE A DAY 100 strip 5   Plecanatide (TRULANCE) 3 MG TABS Take 3 mg by mouth as needed. (Samples from GI)     prednisoLONE acetate (PRED FORTE) 1 % ophthalmic suspension 1 drop 2 (two) times daily.     predniSONE (DELTASONE) 10 MG tablet 4 X 2  DAYS, 3 X 2 DAYS, 2 X 2 DAYS, 1 X 2 DAYS (Patient not taking: Reported on 08/19/2022) 20 tablet 0   rosuvastatin (CRESTOR) 5 MG tablet Take 1 tablet (5 mg total) by mouth daily. 90 tablet 3   sertraline (ZOLOFT) 100 MG tablet TAKE 1 TABLET BY MOUTH EVERY DAY 90 tablet 1   SPIRIVA RESPIMAT 2.5 MCG/ACT AERS USE 2 INHALATIONS ORALLY   DAILY (Patient taking differently: Inhale 2 puffs into the lungs daily as needed.) 12 g 1   traMADol (ULTRAM) 50 MG tablet Take 50 mg by mouth 3 (three) times daily as needed.     triamcinolone ointment (KENALOG) 0.1 % Apply 1  Application topically 2 (two) times daily as needed. 30 g 3   No facility-administered medications prior to visit.    PAST MEDICAL HISTORY: Past Medical History:  Diagnosis Date   Anxiety    Arthritis    RA   BPV (benign positional vertigo) 03/10/2018   Cancer ([place])    lung ca   Carotid stenosis, left 09/19/2018   COPD (chronic obstructive pulmonary disease) ([place])    Depression    Dyspnea    with exertion   GERD (gastroesophageal reflux disease)    Headache syndrome 03/09/2019   Hypercholesteremia    Hypothyroidism    Memory difficulties 10/28/2015   Pneumonia     x3  last time 2017   Pre-diabetes    Rheumatoid arthritis(714.0)    Thyroid disease    Tremor 10/28/2015   Jaw tremor   Uterine prolapse    Vitamin D deficiency     PAST SURGICAL HISTORY: Past Surgical History:  Procedure Laterality Date   ANGIOPLASTY Left 10/10/2018   Procedure: ANGIOPLASTY using 1cm x 14cm Xenosure Patch;  Surgeon: Marty Heck, MD;  Location: [facility] OR;  Service: Vascular;  Laterality: Left;   APPENDECTOMY     COLONOSCOPY W/ POLYPECTOMY     ENDARTERECTOMY Left 10/10/2018   Procedure: ENDARTERECTOMY CAROTID;  Surgeon: Marty Heck, MD;  Location: [facility] OR;  Service: Vascular;  Laterality: Left;   EYE SURGERY Bilateral    cataract   FOOT SURGERY Bilateral    arthritis   Hands  Bilateral    Arthritis   HEMORRHOID SURGERY      right lobectomy  10/2004   TONSILLECTOMY AND ADENOIDECTOMY      FAMILY HISTORY: Family History  Problem Relation Age of Onset   Emphysema Mother    Asthma Mother    Lung cancer Father    Pancreatic cancer Father    Bone cancer Father    Heart disease Brother        x1   Diabetes Brother    Hypertension Brother    Lupus Brother    Heart disease Sister        x2   Diabetes Sister    Hypertension Sister    Diabetes Sister    Hypertension Sister    Diabetes Brother    Hypertension Brother    Stroke Maternal Grandfather    Hypertension Son    Tremor Son     SOCIAL HISTORY: Social History   Socioeconomic History   Marital status: Widowed    Spouse name: Not on file   Number of children: 3   Years of education: LPN   Highest education level: Not on file  Occupational History   Occupation: retired    Fish farm manager: RETIRED    Comment: LPN at [employer] Use   Smoking status: Former    Packs/day: 1.00    Years: 20.00    Total pack years: 20.00    Types: Cigarettes    Quit date: 11/10/2004    Years since quitting: 17.8   Smokeless tobacco: Never  Vaping Use   Vaping Use: Never used  Substance and Sexual Activity   Alcohol use: Yes    Alcohol/week: 1.0 standard drink of alcohol    Types: 1 Glasses of wine per week    Comment: socially   Drug use: No   Sexual activity: Not Currently  Other Topics Concern   Not on file  Social History Narrative   09/09/21 lives with dgtr Lisa Vega; widowed  Patient drinks about 3 cups of coffee daily.   Patient is right handed.    Retired Corporate treasurer at [employer] Strain: Low Risk  (09/12/2021)   Overall Financial Resource Strain (CARDIA)    Difficulty of Paying Living Expenses: Not hard at all  Food Insecurity: No Food Insecurity (09/12/2021)   Hunger Vital Sign    Worried About Running Out of Food in the Last Year: Never true    Ran Out of Food in  the Last Year: Never true  Transportation Needs: No Transportation Needs (09/12/2021)   PRAPARE - Hydrologist (Medical): No    Lack of Transportation (Non-Medical): No  Physical Activity: Inactive (09/12/2021)   Exercise Vital Sign    Days of Exercise per Week: 0 days    Minutes of Exercise per Session: 0 min  Stress: No Stress Concern Present (09/12/2021)   [place]    Feeling of Stress : Not at all  Social Connections: Socially Isolated (09/12/2021)   Social Connection and Isolation Panel [NHANES]    Frequency of Communication with Friends and Family: More than three times a week    Frequency of Social Gatherings with Friends and Family: Once a week    Attends Religious Services: Never    Marine scientist or Organizations: No    Attends Archivist Meetings: Never    Marital Status: Widowed  Intimate Partner Violence: Not At Risk (09/12/2021)   Humiliation, Afraid, Rape, and Kick questionnaire    Fear of Current or Ex-Partner: No    Emotionally Abused: No    Physically Abused: No    Sexually Abused: No   PHYSICAL EXAM  There were no vitals filed for this visit.  There is no height or weight on file to calculate BMI.  Generalized: Well developed, in no acute distress   Neurological examination  Mentation: Alert oriented to time, place, history taking. Follows all commands speech and language fluent Cranial nerve II-XII: Pupils were equal round reactive to light. Extraocular movements were full, visual field were full on confrontational test. Facial sensation and strength were normal. . Head turning and shoulder shrug  were normal and symmetric. Motor: The motor testing reveals 5 over 5 strength of all 4 extremities. Good symmetric motor tone is noted throughout.  Sensory: Sensory testing is intact to soft touch on all 4 extremities. No evidence of extinction is noted.   Coordination: Cerebellar testing reveals good finger-nose-finger and heel-to-shin bilaterally.  Gait and station: Gait is slightly wide-based, but independent  DIAGNOSTIC DATA (LABS, IMAGING, TESTING) - I reviewed patient records, labs, notes, testing and imaging myself where available.  Lab Results  Component Value Date   WBC 9.0 08/10/2022   HGB 12.5 08/10/2022   HCT 38.4 08/10/2022   MCV 96.2 08/10/2022   PLT 225.0 08/10/2022      Component Value Date/Time   NA 137 08/10/2022 1158   NA 139 07/27/2022 1043   NA 139 08/10/2011 1457   K 3.1 (L) 08/10/2022 1158   K 4.4 08/10/2011 1457   CL 97 08/10/2022 1158   CL 98 08/10/2011 1457   CO2 35 (H) 08/10/2022 1158   CO2 29 08/10/2011 1457   GLUCOSE 159 (H) 08/10/2022 1158   GLUCOSE 98 08/10/2011 1457   BUN 10 08/10/2022 1158   BUN 10 07/27/2022 1043   BUN 15 08/10/2011  1457   CREATININE 0.90 08/10/2022 1158   CREATININE 0.98 (H) 01/20/2022 1335   CALCIUM 8.5 08/10/2022 1158   CALCIUM 8.9 08/10/2011 1457   PROT 6.3 08/26/2022 1419   PROT 6.7 04/21/2018 0821   PROT 6.8 08/10/2011 1457   ALBUMIN 3.7 12/02/2021 1153   ALBUMIN 4.0 04/21/2018 0821   AST 29 08/26/2022 1419   AST 26 08/10/2011 1457   ALT 15 08/26/2022 1419   ALT 21 08/10/2011 1457   ALKPHOS 87 12/02/2021 1153   ALKPHOS 67 08/10/2011 1457   BILITOT 0.4 08/26/2022 1419   BILITOT 0.5 04/21/2018 0821   BILITOT 0.70 08/10/2011 1457   GFRNONAA 54 (L) 06/25/2021 1339   GFRAA 63 06/25/2021 1339   Lab Results  Component Value Date   CHOL 148 07/27/2022   HDL 60 07/27/2022   LDLCALC 68 07/27/2022   TRIG 114 07/27/2022   CHOLHDL 2.5 07/27/2022   Lab Results  Component Value Date   HGBA1C 5.6 06/11/2022   HGBA1C 5.6 06/11/2022   HGBA1C 5.6 (A) 06/11/2022   HGBA1C 5.6 06/11/2022   Lab Results  Component Value Date   VITAMINB12 354 10/28/2015   Lab Results  Component Value Date   TSH 0.72 12/02/2021    ASSESSMENT AND PLAN 88 y.o. year old female    1.  Chronic daily headache 2.  Positional vertigo  -With some migraine features, headaches 3 times a week -Tried and failed gabapentin, Lyrica, baclofen, Cymbalta -Side effect with oral medications, is 88 years old, not a candidate for beta blocker due to low HR at times, already on Zoloft, worry side effect with Topamax, Depakote -Will try CGRP, Emgality as trial for headache prevention, did not use Aimovig due to significant constipation at baseline -For now can continue the gabapentin, cannot tolerate higher doses -Call for dose adjustment, follow-up in 6 months or sooner if needed with me, will be followed by Dr. Jaynee Vega since Dr. Jannifer Vega retired  No orders of the defined types were placed in this encounter.  Butler Denmark, AGNP-C, DNP 09/10/2022, 1:14 PM [facility] [address] [place], [place] [address] [phone]

## 2022-09-15 ENCOUNTER — Ambulatory Visit: Payer: Medicare Other | Admitting: Neurology

## 2022-09-16 ENCOUNTER — Ambulatory Visit (INDEPENDENT_AMBULATORY_CARE_PROVIDER_SITE_OTHER): Payer: Medicare Other | Admitting: Radiology

## 2022-09-16 VITALS — BP 116/62

## 2022-09-16 DIAGNOSIS — Z961 Presence of intraocular lens: Secondary | ICD-10-CM | POA: Diagnosis not present

## 2022-09-16 DIAGNOSIS — H02403 Unspecified ptosis of bilateral eyelids: Secondary | ICD-10-CM | POA: Diagnosis not present

## 2022-09-16 DIAGNOSIS — H18513 Endothelial corneal dystrophy, bilateral: Secondary | ICD-10-CM | POA: Diagnosis not present

## 2022-09-16 DIAGNOSIS — N814 Uterovaginal prolapse, unspecified: Secondary | ICD-10-CM

## 2022-09-16 DIAGNOSIS — N3001 Acute cystitis with hematuria: Secondary | ICD-10-CM

## 2022-09-16 DIAGNOSIS — R300 Dysuria: Secondary | ICD-10-CM | POA: Diagnosis not present

## 2022-09-16 DIAGNOSIS — H10013 Acute follicular conjunctivitis, bilateral: Secondary | ICD-10-CM | POA: Diagnosis not present

## 2022-09-16 DIAGNOSIS — L718 Other rosacea: Secondary | ICD-10-CM | POA: Diagnosis not present

## 2022-09-16 MED ORDER — AMOXICILLIN-POT CLAVULANATE 875-125 MG PO TABS: 1.0000 | ORAL_TABLET | Freq: Two times a day (BID) | ORAL | 0 refills | Status: DC

## 2022-09-16 NOTE — Progress Notes (Signed)
      SUBJECTIVE: Lisa Vega is a 88 y.o. female complains of dysuria, bladder pressure, frequency, urgency x's 1 week.  Pessary felt out of place (after having a BM) and she tried to move it herself--desires pessary readjustment.  OBJECTIVE: Appears well, in no apparent distress.  Vital signs are normal. The abdomen is soft without tenderness, guarding, mass, rebound or organomegaly. No CVA tenderness or inguinal adenopathy noted. Urine dipstick shows positive for RBC's, positive for protein, positive for nitrates, and positive for leukocytes.  Micro exam: >60 WBC's per HPF, 3-10 RBC's per HPF, and many+ bacteria.   Pessary readjusted under the pelvic bone for comfort.  Chaperone offered and declined for exam.  ASSESSMENT/PLAN:  1. Dysuria - Urinalysis,Complete w/RFL Culture  2. Acute cystitis with hematuria - amoxicillin-clavulanate (AUGMENTIN) 875-125 MG tablet; Take 1 tablet by mouth 2 (two) times daily.  Dispense: 14 tablet; Refill: 0  3. Cystocele with uterine prolapse Pessary adjusted   Will send urine culture and sensitivity.  Treatment per orders - also push fluids, avoid bladder irritants. Instructed she may use Pyridium OTC prn. Call or return to clinic prn if these symptoms worsen or fail to improve as anticipated. Pyelo precautions reviewed with patient.

## 2022-09-17 ENCOUNTER — Telehealth: Payer: Self-pay

## 2022-09-17 ENCOUNTER — Ambulatory Visit: Payer: Medicare Other

## 2022-09-17 ENCOUNTER — Ambulatory Visit: Payer: Medicare Other | Admitting: Neurology

## 2022-09-17 NOTE — Telephone Encounter (Signed)
Called patient lvm on home phone and cell to return call to complete AWV.  If patient returns call to office patient may reschedule for the next available appointment with [facility] or CMA. -S. Bo Teicher,LPN

## 2022-09-17 NOTE — Telephone Encounter (Signed)
error 

## 2022-09-20 LAB — URINALYSIS, COMPLETE W/RFL CULTURE
Bilirubin Urine: NEGATIVE
Glucose, UA: NEGATIVE
Hyaline Cast: NONE SEEN /LPF
Ketones, ur: NEGATIVE
Nitrites, Initial: POSITIVE — AB
Specific Gravity, Urine: 1.01 (ref 1.001–1.035)
WBC, UA: >=60 /HPF — AB (ref 0–5)
pH: 5.5 (ref 5.0–8.0)

## 2022-09-20 LAB — URINE CULTURE
MICRO NUMBER:: 13943897
SPECIMEN QUALITY:: ADEQUATE

## 2022-09-20 LAB — CULTURE INDICATED

## 2022-09-21 ENCOUNTER — Telehealth: Payer: Self-pay

## 2022-09-21 ENCOUNTER — Telehealth: Payer: Medicare Other

## 2022-09-21 NOTE — Telephone Encounter (Signed)
She was treated with Augmentin at her visit and the bacteria is sensitive to it. Continue to take the medication and finish all. Likely caused from her pessary.

## 2022-09-21 NOTE — Telephone Encounter (Signed)
Spoke with daughter and advised her.

## 2022-09-21 NOTE — Telephone Encounter (Signed)
Daughter called. Saw Patient's urine culture result in My Chart and is concerned.

## 2022-09-29 DIAGNOSIS — M5030 Other cervical disc degeneration, unspecified cervical region: Secondary | ICD-10-CM | POA: Diagnosis not present

## 2022-09-29 DIAGNOSIS — M545 Low back pain, unspecified: Secondary | ICD-10-CM | POA: Diagnosis not present

## 2022-10-19 ENCOUNTER — Ambulatory Visit (INDEPENDENT_AMBULATORY_CARE_PROVIDER_SITE_OTHER): Payer: Medicare Other | Admitting: Podiatrist

## 2022-10-19 ENCOUNTER — Encounter: Payer: Self-pay | Admitting: Podiatrist

## 2022-10-19 DIAGNOSIS — I6523 Occlusion and stenosis of bilateral carotid arteries: Secondary | ICD-10-CM

## 2022-10-19 DIAGNOSIS — L600 Ingrowing nail: Secondary | ICD-10-CM | POA: Diagnosis not present

## 2022-10-19 NOTE — Progress Notes (Signed)
Chief Complaint  Lisa Vega presents with   Ingrown Toenail    Ingrown on bilateral great toes, lateral sides     HPI: Lisa Vega is 88 y.o. female who presents today for painful corners of bilateral great toenails. She relates she is unable to trim her own toenails like she used to be able to do and she has noticed the sides of the big toenails are painful especially from pressure from the second toes pressing against them. She has rheumatoid arthritis and has undergone surgery in the past. She has contracted toes due to rheumatoid.   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   Dysphagia 02/24/2021   Essential tremor 02/24/2021   History of colonic polyps 02/24/2021   Mixed hyperlipidemia 02/24/2021   Vitamin B12 deficiency 02/24/2021   Vitamin D deficiency 02/24/2021   Slow transit constipation 02/24/2021   COVID-19 01/28/2021   Headache, cervicogenic 01/03/2021   High risk medication use 12/06/2020   Rosacea 11/29/2020   Cervical spondylosis 04/18/2020   Primary osteoarthritis of first carpometacarpal joint of right hand 05/10/2019   Headache syndrome 03/09/2019   Carotid stenosis, left 09/19/2018   Osteoarthritis of left knee 05/12/2018   Benign positional vertigo 03/10/2018   History of lung cancer 11/29/2017   Difficulty sleeping 09/22/2017   Diabetes ([place]) 05/13/2017   Fatigue 05/12/2017   CAP (community acquired pneumonia) 05/03/2017   Orthopnea 01/12/2017   GERD (gastroesophageal reflux disease) 11/09/2016   Osteoporosis 05/06/2016   Depression 05/06/2016   Hiatal hernia 05/06/2016    Hypothyroidism 05/06/2016   Tremor 10/28/2015   Memory difficulties 10/28/2015   COPD exacerbation ([place]) 01/08/2015   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 ([place]) 05/30/2014   Diverticulitis of colon without hemorrhage 08/07/2013   Prolapse of female pelvic organs 06/22/2013   Neck pain 03/10/2013   Vaginal atrophy 03/09/2013   Rectocele 03/09/2013   Cystocele 03/09/2013   Urinary incontinence 03/09/2013   COPD (chronic obstructive pulmonary disease) ([place]) 01/16/2011   Malignant neoplasm of bronchus and lung ([place]) 12/17/2010   Rheumatoid arthritis ([place]) 12/17/2010   Dyspnea on exertion 12/17/2010    Current Outpatient Medications on File Prior to Visit  Medication Sig Dispense Refill   acetaminophen (TYLENOL) 325 MG tablet Take 650 mg by mouth every 6 (six) hours as needed for mild pain.     albuterol (PROAIR HFA) 108 (90 Base) MCG/ACT inhaler Inhale 2 puffs into the lungs every 6 (six) hours as needed for wheezing or shortness of breath. 8.5 g 5   ALPRAZolam (XANAX) 0.25 MG tablet TAKE 1 TABLET BY MOUTH EVERY DAY AS NEEDED FOR ANXIETY 30 tablet 2   amoxicillin-clavulanate (AUGMENTIN) 875-125 MG tablet Take 1 tablet by mouth 2 (two) times daily. 14 tablet 0   aspirin EC 81 MG tablet Take 81 mg by mouth daily.     B-D TB SYRINGE 1CC/27GX1/2" 27G X 1/2" 1 ML MISC For use with methotrexate injection weekly 25 each 1   budesonide-formoterol (SYMBICORT) 160-4.5 MCG/ACT inhaler USE 2 INHALATIONS ORALLY   TWICE DAILY 30.6 g 2   clidinium-chlordiazePOXIDE (LIBRAX) 5-2.5 MG capsule  SMARTSIG:1 Capsule(s) By Mouth Every 12 Hours PRN     estradiol (ESTRACE) 0.1 MG/GM vaginal cream Place 1 g vaginally 3 (three) times a week. 42.5 g 6   famotidine (PEPCID) 40 MG tablet TAKE 1 TABLET DAILY 90 tablet 3   folic acid (FOLVITE) 1 MG tablet Take 1 tablet (1 mg total) by mouth daily. 90 tablet 1   gabapentin (NEURONTIN) 100 MG capsule Take 1 capsule (100 mg total) by mouth 3 (three) times daily.  270 capsule 3   Galcanezumab-gnlm (EMGALITY) 120 MG/ML SOAJ Inject 120 mg into the skin every 30 (thirty) days. 1.12 mL 11   ibuprofen (ADVIL,MOTRIN) 200 MG tablet Take 200 mg by mouth every 6 (six) hours as needed for headache or mild pain.     levothyroxine (SYNTHROID) 88 MCG tablet Take 1 tablet (88 mcg total) by mouth daily. 90 tablet 1   losartan (COZAAR) 25 MG tablet Take 1 tablet (25 mg total) by mouth daily. 90 tablet 2   meclizine (ANTIVERT) 25 MG tablet Take 0.5-1 tablets (12.5-25 mg total) by mouth 3 (three) times daily as needed for dizziness. 60 tablet 5   Methotrexate Sodium (METHOTREXATE, PF,) 50 MG/2ML injection INJECT 0.7 MLS (17.5 MG TOTAL) INTO THE SKIN ONCE A WEEK. 4 mL 2   mupirocin ointment (BACTROBAN) 2 % Place 1 application into the nose 2 (two) times daily. 22 g 1   omeprazole (PRILOSEC) 40 MG capsule Take 40 mg by mouth daily.     ONETOUCH DELICA LANCETS 28B MISC USE TO CHECK SUGARS UP TO 4 TIMES A DAY  0   ONETOUCH ULTRA test strip USE TO CHECK BLOOD SUGARS TWICE A DAY 100 strip 5   Plecanatide (TRULANCE) 3 MG TABS Take 3 mg by mouth as needed. (Samples from GI)     prednisoLONE acetate (PRED FORTE) 1 % ophthalmic suspension 1 drop 2 (two) times daily.     predniSONE (DELTASONE) 10 MG tablet 4 X 2 DAYS, 3 X 2 DAYS, 2 X 2 DAYS, 1 X 2 DAYS 20 tablet 0   sertraline (ZOLOFT) 100 MG tablet TAKE 1 TABLET BY MOUTH EVERY DAY 90 tablet 1   SPIRIVA RESPIMAT 2.5 MCG/ACT AERS USE 2 INHALATIONS ORALLY   DAILY (Lisa Vega taking differently: Inhale 2 puffs into the lungs daily as needed.) 12 g 1   traMADol (ULTRAM) 50 MG tablet Take 50 mg by mouth 3 (three) times daily as needed.     triamcinolone ointment (KENALOG) 0.1 % Apply 1 Application topically 2 (two) times daily as needed. 30 g 3   rosuvastatin (CRESTOR) 5 MG tablet Take 1 tablet (5 mg total) by mouth daily. 90 tablet 3   No current facility-administered medications on file prior to visit.    Allergies  Allergen Reactions    Codeine Other (See Comments)    REACTION: hallucinations   Meperidine Hcl Nausea And Vomiting    REACTION: severe GI upset   Demerol Other (See Comments)   Escitalopram Other (See Comments)    Other reaction(s): memory issues   Meperidine Hcl Other (See Comments)   Other Other (See Comments)   Sulfa Antibiotics Rash    Review of Systems No fevers, chills, nausea, muscle aches, no difficulty breathing, no calf pain, no chest pain or shortness of breath.   Physical Exam  GENERAL APPEARANCE: Alert, conversant. Appropriately groomed. No acute distress.   VASCULAR: Pedal pulses palpable 1/4 DP and 1/4 PT bilateral.  Capillary refill time is immediate to all digits,  Proximal to distal cooling is warm to warm.  Digital hair growth present.    NEUROLOGIC: sensation is intact to 5.07 monofilament at 5/5 sites bilateral.  Light touch is intact bilateral, vibratory sensation intact bilateral  MUSCULOSKELETAL: digital contactures present bilateral. Prominent metatarsal heads present bilateral.  Second toes have been straightened in the past and are shorter than the third digits.  Hallux under-rides second toes bilateral.   DERMATOLOGIC: skin is warm, supple, and dry.  Color, texture, and turgor of skin within normal limits.  No open wounds are noted.    Hallux nails are not infected, not inflammed, minimally ingrown on the lateral borders of both great toenails.  Mildly elongated.  Remainder of toenails are asymptomatic.  Callus present submet 3 right foot as well.    Assessment     ICD-10-CM   1. Ingrown right greater toenail  L60.0     2. Ingrown left greater toenail  L60.0        Plan  Exam findings and treatment recommendations discussed. At todays visit, because there is no sign of infection and mild tenderness to palpation, I recommended a slant back procedure and trimming of the nails to relieve the pressure on the corners. This was carried out without anesthesia and without  complication.  Discussed a permanent procedure could be considered in the future should this fail to relieve her pain.  Also discussed the permanent procedure, soaking requirements and wound care.  She will be seen back in 3 months for a nail trim and will call sooner if a permanent procedure is requested.

## 2022-10-21 ENCOUNTER — Encounter: Payer: Self-pay | Admitting: Neurology

## 2022-10-21 ENCOUNTER — Ambulatory Visit (INDEPENDENT_AMBULATORY_CARE_PROVIDER_SITE_OTHER): Payer: Medicare Other | Admitting: Neurology

## 2022-10-21 VITALS — BP 142/71 | HR 80 | Ht 63.0 in | Wt 128.5 lb

## 2022-10-21 DIAGNOSIS — G4489 Other headache syndrome: Secondary | ICD-10-CM | POA: Diagnosis not present

## 2022-10-21 DIAGNOSIS — H8110 Benign paroxysmal vertigo, unspecified ear: Secondary | ICD-10-CM | POA: Diagnosis not present

## 2022-10-21 MED ORDER — GABAPENTIN 100 MG PO CAPS: 100.0000 mg | ORAL_CAPSULE | Freq: Three times a day (TID) | ORAL | 3 refills | Status: AC

## 2022-10-21 NOTE — Progress Notes (Signed)
PATIENT: [name] DOB: [birth year]  REASON FOR VISIT: Follow up for positional vertigo, chronic gait disorder, chronic daily headache HISTORY FROM: Patient, daughter  PRIMARY NEUROLOGIST: Dr. Jaynee Eagles   HISTORY OF PRESENT ILLNESS: Today 10/21/22 Here today for follow-up. Still has headache, to vertex, occipital area, worse on the left side. Feels like sharp pain, needles; also has pain into both ears usually unrelated to headache, puts drops in ears it helps. I ordered Emgality, she didn't start this, didn't want to. Takes gabapentin 100 mg BID, when taking 3 a day was too sleepy. Vertigo comes and goes. Does her Epley maneuvers. Has seen cardiology, for how HR has COPD several exacerbations, uses oxygen. Frequent UTI, has pessary. Has RA. Gets injections in her knee. Daughter mentions she hasn't complained of headache in awhile. Really doesn't want any more medications. She does have neck pain, has had neck injection, she hated it.   Update 03/10/22 SS: Ms. Arilyn Brierley here today for follow-up.  Started tizanidine at last visit for daily headache.  Is on gabapentin. Headaches continue, burning to top of head, then develops "pain" to frontal area and temples. Will lay down anywhere 5 minutes to 2 hours. Happens 2-3 days a week. The pain process is no longer than 30 minutes, doesn't take any medication. Some nausea, sensitivity to light. Claims tizanidine didn't help, made her sick. On gabapentin 100 mg 3 times daily, not sure if helping, doesn't want any higher doses. Has Tramadol from Dr. Nelva Bush if needed for orthopedic pain. Vertigo is doing well right now. The epley maneuvers hurt her neck to do routinely.lives with her daughter, Lovey Newcomer. Has seen cardiology, planning for stress test, HR's in the 30's. Consider cardiac cath.   HISTORY  09/09/2021 Dr. Jannifer Franklin: [name] is an 88 year old right-handed white female with a history of rheumatoid arthritis.  The patient has had chronic issues with  positional vertigo that is activated by laying on her right side.  She we will temporarily gain benefit with vestibular rehabilitation.  She was given a sheet delineating the Epley maneuvers, but she has not been doing these on a regular basis at home.  She has a baseline chronic issue with gait instability.  She continues to have her daily headache issues with burning sensations that may migrate about the top and the sides and back of the head with occasional pressure sensation behind the eyes.  The patient may take ibuprofen at times for the headache.  She has been tried on duloxetine, gabapentin, Lyrica, and baclofen without benefit or tolerance.  The daughter who comes with her today recalls that while she was on duloxetine that she did not complain of the headaches as much.  The patient does have some neck pain off and on but she does not clearly correlate the neck stiffness and discomfort with the headache.  She has had occasional falls, she fractured her left hip 4 months ago.  She does not use a cane or a walker when she is outside the house.  REVIEW OF SYSTEMS: Out of a complete 14 system review of symptoms, the patient complains only of the following symptoms, and all other reviewed systems are negative.  See HPI  ALLERGIES: Allergies  Allergen Reactions   Codeine Other (See Comments)    REACTION: hallucinations   Meperidine Hcl Nausea And Vomiting    REACTION: severe GI upset   Demerol Other (See Comments)   Escitalopram Other (See Comments)    Other reaction(s): memory issues  Meperidine Hcl Other (See Comments)   Other Other (See Comments)   Sulfa Antibiotics Rash    HOME MEDICATIONS: Outpatient Medications Prior to Visit  Medication Sig Dispense Refill   acetaminophen (TYLENOL) 325 MG tablet Take 650 mg by mouth every 6 (six) hours as needed for mild pain.     albuterol (PROAIR HFA) 108 (90 Base) MCG/ACT inhaler Inhale 2 puffs into the lungs every 6 (six) hours as needed for  wheezing or shortness of breath. 8.5 g 5   ALPRAZolam (XANAX) 0.25 MG tablet TAKE 1 TABLET BY MOUTH EVERY DAY AS NEEDED FOR ANXIETY 30 tablet 2   aspirin EC 81 MG tablet Take 81 mg by mouth daily.     B-D TB SYRINGE 1CC/27GX1/2" 27G X 1/2" 1 ML MISC For use with methotrexate injection weekly 25 each 1   budesonide-formoterol (SYMBICORT) 160-4.5 MCG/ACT inhaler USE 2 INHALATIONS ORALLY   TWICE DAILY 30.6 g 2   clidinium-chlordiazePOXIDE (LIBRAX) 5-2.5 MG capsule SMARTSIG:1 Capsule(s) By Mouth Every 12 Hours PRN     estradiol (ESTRACE) 0.1 MG/GM vaginal cream Place 1 g vaginally 3 (three) times a week. 42.5 g 6   famotidine (PEPCID) 40 MG tablet TAKE 1 TABLET DAILY 90 tablet 3   folic acid (FOLVITE) 1 MG tablet Take 1 tablet (1 mg total) by mouth daily. 90 tablet 1   ibuprofen (ADVIL,MOTRIN) 200 MG tablet Take 200 mg by mouth every 6 (six) hours as needed for headache or mild pain.     levothyroxine (SYNTHROID) 88 MCG tablet Take 1 tablet (88 mcg total) by mouth daily. 90 tablet 1   losartan (COZAAR) 25 MG tablet Take 1 tablet (25 mg total) by mouth daily. 90 tablet 2   meclizine (ANTIVERT) 25 MG tablet Take 0.5-1 tablets (12.5-25 mg total) by mouth 3 (three) times daily as needed for dizziness. 60 tablet 5   Methotrexate Sodium (METHOTREXATE, PF,) 50 MG/2ML injection INJECT 0.7 MLS (17.5 MG TOTAL) INTO THE SKIN ONCE A WEEK. 4 mL 2   mupirocin ointment (BACTROBAN) 2 % Place 1 application into the nose 2 (two) times daily. 22 g 1   omeprazole (PRILOSEC) 40 MG capsule Take 40 mg by mouth daily.     ONETOUCH DELICA LANCETS 76P MISC USE TO CHECK SUGARS UP TO 4 TIMES A DAY  0   ONETOUCH ULTRA test strip USE TO CHECK BLOOD SUGARS TWICE A DAY 100 strip 5   Plecanatide (TRULANCE) 3 MG TABS Take 3 mg by mouth as needed. (Samples from GI)     prednisoLONE acetate (PRED FORTE) 1 % ophthalmic suspension 1 drop 2 (two) times daily.     predniSONE (DELTASONE) 10 MG tablet 4 X 2 DAYS, 3 X 2 DAYS, 2 X 2 DAYS, 1 X  2 DAYS 20 tablet 0   sertraline (ZOLOFT) 100 MG tablet TAKE 1 TABLET BY MOUTH EVERY DAY 90 tablet 1   SPIRIVA RESPIMAT 2.5 MCG/ACT AERS USE 2 INHALATIONS ORALLY   DAILY (Patient taking differently: Inhale 2 puffs into the lungs daily as needed.) 12 g 1   traMADol (ULTRAM) 50 MG tablet Take 50 mg by mouth 3 (three) times daily as needed.     triamcinolone ointment (KENALOG) 0.1 % Apply 1 Application topically 2 (two) times daily as needed. 30 g 3   gabapentin (NEURONTIN) 100 MG capsule Take 1 capsule (100 mg total) by mouth 3 (three) times daily. 270 capsule 3   Galcanezumab-gnlm (EMGALITY) 120 MG/ML SOAJ Inject 120 mg into the  skin every 30 (thirty) days. 1.12 mL 11   rosuvastatin (CRESTOR) 5 MG tablet Take 1 tablet (5 mg total) by mouth daily. 90 tablet 3   amoxicillin-clavulanate (AUGMENTIN) 875-125 MG tablet Take 1 tablet by mouth 2 (two) times daily. 14 tablet 0   No facility-administered medications prior to visit.    PAST MEDICAL HISTORY: Past Medical History:  Diagnosis Date   Anxiety    Arthritis    RA   BPV (benign positional vertigo) 03/10/2018   Cancer ([place])    lung ca   Carotid stenosis, left 09/19/2018   COPD (chronic obstructive pulmonary disease) ([place])    Depression    Dyspnea    with exertion   GERD (gastroesophageal reflux disease)    Headache syndrome 03/09/2019   Hypercholesteremia    Hypothyroidism    Memory difficulties 10/28/2015   Pneumonia     x3  last time 2017   Pre-diabetes    Rheumatoid arthritis(714.0)    Thyroid disease    Tremor 10/28/2015   Jaw tremor   Uterine prolapse    Vitamin D deficiency     PAST SURGICAL HISTORY: Past Surgical History:  Procedure Laterality Date   ANGIOPLASTY Left 10/10/2018   Procedure: ANGIOPLASTY using 1cm x 14cm Xenosure Patch;  Surgeon: Marty Heck, MD;  Location: [facility] OR;  Service: Vascular;  Laterality: Left;   APPENDECTOMY     COLONOSCOPY W/ POLYPECTOMY     ENDARTERECTOMY Left 10/10/2018    Procedure: ENDARTERECTOMY CAROTID;  Surgeon: Marty Heck, MD;  Location: [facility] OR;  Service: Vascular;  Laterality: Left;   EYE SURGERY Bilateral    cataract   FOOT SURGERY Bilateral    arthritis   Hands  Bilateral    Arthritis   HEMORRHOID SURGERY     right lobectomy  10/2004   TONSILLECTOMY AND ADENOIDECTOMY      FAMILY HISTORY: Family History  Problem Relation Age of Onset   Emphysema Mother    Asthma Mother    Lung cancer Father    Pancreatic cancer Father    Bone cancer Father    Heart disease Brother        x1   Diabetes Brother    Hypertension Brother    Lupus Brother    Heart disease Sister        x2   Diabetes Sister    Hypertension Sister    Diabetes Sister    Hypertension Sister    Diabetes Brother    Hypertension Brother    Stroke Maternal Grandfather    Hypertension Son    Tremor Son     SOCIAL HISTORY: Social History   Socioeconomic History   Marital status: Widowed    Spouse name: Not on file   Number of children: 3   Years of education: LPN   Highest education level: Not on file  Occupational History   Occupation: retired    Fish farm manager: RETIRED    Comment: LPN at [employer] Use   Smoking status: Former    Packs/day: 1.00    Years: 20.00    Total pack years: 20.00    Types: Cigarettes    Quit date: 11/10/2004    Years since quitting: 17.9   Smokeless tobacco: Never  Vaping Use   Vaping Use: Never used  Substance and Sexual Activity   Alcohol use: Yes    Alcohol/week: 1.0 standard drink of alcohol    Types: 1 Glasses of wine per week  Comment: socially   Drug use: No   Sexual activity: Not Currently  Other Topics Concern   Not on file  Social History Narrative   09/09/21 lives with dgtr Lovey Newcomer; widowed   Patient drinks about 3 cups of coffee daily.   Patient is right handed.    Retired Corporate treasurer at [employer] Strain: Low Risk   (09/12/2021)   Overall Financial Resource Strain (CARDIA)    Difficulty of Paying Living Expenses: Not hard at all  Food Insecurity: No Food Insecurity (09/12/2021)   Hunger Vital Sign    Worried About Running Out of Food in the Last Year: Never true    Ran Out of Food in the Last Year: Never true  Transportation Needs: No Transportation Needs (09/12/2021)   PRAPARE - Hydrologist (Medical): No    Lack of Transportation (Non-Medical): No  Physical Activity: Inactive (09/12/2021)   Exercise Vital Sign    Days of Exercise per Week: 0 days    Minutes of Exercise per Session: 0 min  Stress: No Stress Concern Present (09/12/2021)   [place]    Feeling of Stress : Not at all  Social Connections: Socially Isolated (09/12/2021)   Social Connection and Isolation Panel [NHANES]    Frequency of Communication with Friends and Family: More than three times a week    Frequency of Social Gatherings with Friends and Family: Once a week    Attends Religious Services: Never    Marine scientist or Organizations: No    Attends Archivist Meetings: Never    Marital Status: Widowed  Intimate Partner Violence: Not At Risk (09/12/2021)   Humiliation, Afraid, Rape, and Kick questionnaire    Fear of Current or Ex-Partner: No    Emotionally Abused: No    Physically Abused: No    Sexually Abused: No   PHYSICAL EXAM  Vitals:   10/21/22 1315  BP: (!) 142/71  Pulse: 80  Weight: 128 lb 8 oz (58.3 kg)  Height: 5\' 3"  (1.6 m)    Body mass index is 22.76 kg/m.  Generalized: Well developed, in no acute distress   Neurological examination  Mentation: Alert oriented to time, place, history taking. Follows all commands speech and language fluent Cranial nerve II-XII: Pupils were equal round reactive to light. Extraocular movements were full, visual field were full on confrontational test. Facial  sensation and strength were normal. . Head turning and shoulder shrug  were normal and symmetric. Motor: Good strength throughout, exception left knee pain 4/5 knee flexion/extension Sensory: Sensory testing is intact to soft touch on all 4 extremities. No evidence of extinction is noted.  Coordination: Cerebellar testing reveals good finger-nose-finger and heel-to-shin bilaterally.  Gait and station: Gait is slightly wide-based, but independent, slightly limp on the left   DIAGNOSTIC DATA (LABS, IMAGING, TESTING) - I reviewed patient records, labs, notes, testing and imaging myself where available.  Lab Results  Component Value Date   WBC 9.0 08/10/2022   HGB 12.5 08/10/2022   HCT 38.4 08/10/2022   MCV 96.2 08/10/2022   PLT 225.0 08/10/2022      Component Value Date/Time   NA 137 08/10/2022 1158   NA 139 07/27/2022 1043   NA 139 08/10/2011 1457   K 3.1 (L) 08/10/2022 1158   K 4.4 08/10/2011 1457   CL 97 08/10/2022 1158  CL 98 08/10/2011 1457   CO2 35 (H) 08/10/2022 1158   CO2 29 08/10/2011 1457   GLUCOSE 159 (H) 08/10/2022 1158   GLUCOSE 98 08/10/2011 1457   BUN 10 08/10/2022 1158   BUN 10 07/27/2022 1043   BUN 15 08/10/2011 1457   CREATININE 0.90 08/10/2022 1158   CREATININE 0.98 (H) 01/20/2022 1335   CALCIUM 8.5 08/10/2022 1158   CALCIUM 8.9 08/10/2011 1457   PROT 6.3 08/26/2022 1419   PROT 6.7 04/21/2018 0821   PROT 6.8 08/10/2011 1457   ALBUMIN 3.7 12/02/2021 1153   ALBUMIN 4.0 04/21/2018 0821   AST 29 08/26/2022 1419   AST 26 08/10/2011 1457   ALT 15 08/26/2022 1419   ALT 21 08/10/2011 1457   ALKPHOS 87 12/02/2021 1153   ALKPHOS 67 08/10/2011 1457   BILITOT 0.4 08/26/2022 1419   BILITOT 0.5 04/21/2018 0821   BILITOT 0.70 08/10/2011 1457   GFRNONAA 54 (L) 06/25/2021 1339   GFRAA 63 06/25/2021 1339   Lab Results  Component Value Date   CHOL 148 07/27/2022   HDL 60 07/27/2022   LDLCALC 68 07/27/2022   TRIG 114 07/27/2022   CHOLHDL 2.5 07/27/2022   Lab  Results  Component Value Date   HGBA1C 5.6 06/11/2022   HGBA1C 5.6 06/11/2022   HGBA1C 5.6 (A) 06/11/2022   HGBA1C 5.6 06/11/2022   Lab Results  Component Value Date   VITAMINB12 354 10/28/2015   Lab Results  Component Value Date   TSH 0.72 12/02/2021    ASSESSMENT AND PLAN 88 y.o. year old female   1.  Chronic daily headache 2.  Positional vertigo  -Her headaches have been longstanding for several years, some of her headaches have migraine features, could also be cervicogenic, has had injections in her neck in the past  -Previously tried and failed gabapentin, Lyrica, baclofen, Cymbalta; I ordered Emgality at last visit she decided not to start it -For now, we will continue to monitor, continue gabapentin 100 mg up to 3 times daily -I will see her back in 1 year, or sooner if needed, I can get her in to see Dr. Jaynee Eagles if she wishes  Meds ordered this encounter  Medications   gabapentin (NEURONTIN) 100 MG capsule    Sig: Take 1 capsule (100 mg total) by mouth 3 (three) times daily.    Dispense:  270 capsule    Refill:  3   Butler Denmark, [place], DNP 10/21/2022, 2:50 PM [facility] [address] [place], [place] [address] [phone]

## 2022-10-21 NOTE — Patient Instructions (Addendum)
We will continue the gabapentin  Continue to see your primary care doctor Call for any worsening symptoms

## 2022-10-30 ENCOUNTER — Other Ambulatory Visit: Payer: Self-pay | Admitting: Internal Medicine

## 2022-10-30 DIAGNOSIS — M069 Rheumatoid arthritis, unspecified: Secondary | ICD-10-CM

## 2022-11-02 ENCOUNTER — Ambulatory Visit (HOSPITAL_COMMUNITY)
Admission: RE | Admit: 2022-11-02 | Discharge: 2022-11-02 | Disposition: A | Discharge status: Home / Self Care | Payer: Medicare Other | Source: Ambulatory Visit | Attending: Internal Medicine | Admitting: Internal Medicine

## 2022-11-02 DIAGNOSIS — Z9221 Personal history of antineoplastic chemotherapy: Secondary | ICD-10-CM | POA: Diagnosis not present

## 2022-11-02 DIAGNOSIS — J449 Chronic obstructive pulmonary disease, unspecified: Secondary | ICD-10-CM | POA: Diagnosis not present

## 2022-11-02 DIAGNOSIS — J219 Acute bronchiolitis, unspecified: Secondary | ICD-10-CM

## 2022-11-02 DIAGNOSIS — J4489 Other specified chronic obstructive pulmonary disease: Secondary | ICD-10-CM

## 2022-11-02 DIAGNOSIS — R911 Solitary pulmonary nodule: Secondary | ICD-10-CM | POA: Insufficient documentation

## 2022-11-02 DIAGNOSIS — R0602 Shortness of breath: Secondary | ICD-10-CM | POA: Diagnosis present

## 2022-11-02 DIAGNOSIS — I2510 Atherosclerotic heart disease of native coronary artery without angina pectoris: Secondary | ICD-10-CM | POA: Insufficient documentation

## 2022-11-02 DIAGNOSIS — R918 Other nonspecific abnormal finding of lung field: Secondary | ICD-10-CM | POA: Diagnosis not present

## 2022-11-02 DIAGNOSIS — Z85118 Personal history of other malignant neoplasm of bronchus and lung: Secondary | ICD-10-CM | POA: Insufficient documentation

## 2022-11-02 DIAGNOSIS — I700 Atherosclerosis of aorta: Secondary | ICD-10-CM | POA: Diagnosis not present

## 2022-11-02 NOTE — Telephone Encounter (Signed)
Next Visit: 11/25/2022  Last Visit: 08/26/2022  Last Fill: 04/02/2022  DX: : Rheumatoid arthritis involving multiple sites, unspecified whether rheumatoid factor present    Current Dose per office note 08/26/2022: Methotrexate INJECT 0.7 MLS (17.5 MG TOTAL) INTO THE SKIN ONCE A WEEK   Labs: 08/10/2022, RDW 16.3, Basophils Absolute 0.2, Potassium 3.1, CO2 35, Glucose 159, GFR 57.33, D-Dimer 0.93  Okay to refill MTX?

## 2022-11-10 ENCOUNTER — Ambulatory Visit: Payer: Medicare Other | Admitting: Pulmonary Disease

## 2022-11-11 ENCOUNTER — Ambulatory Visit: Payer: Medicare Other | Admitting: Radiology

## 2022-11-12 DIAGNOSIS — H02423 Myogenic ptosis of bilateral eyelids: Secondary | ICD-10-CM | POA: Diagnosis not present

## 2022-11-12 DIAGNOSIS — H0279 Other degenerative disorders of eyelid and periocular area: Secondary | ICD-10-CM | POA: Diagnosis not present

## 2022-11-12 DIAGNOSIS — H02831 Dermatochalasis of right upper eyelid: Secondary | ICD-10-CM | POA: Diagnosis not present

## 2022-11-12 DIAGNOSIS — H53483 Generalized contraction of visual field, bilateral: Secondary | ICD-10-CM | POA: Diagnosis not present

## 2022-11-12 DIAGNOSIS — H02422 Myogenic ptosis of left eyelid: Secondary | ICD-10-CM | POA: Diagnosis not present

## 2022-11-12 DIAGNOSIS — H02834 Dermatochalasis of left upper eyelid: Secondary | ICD-10-CM | POA: Diagnosis not present

## 2022-11-12 DIAGNOSIS — H02421 Myogenic ptosis of right eyelid: Secondary | ICD-10-CM | POA: Diagnosis not present

## 2022-11-17 ENCOUNTER — Ambulatory Visit: Payer: Medicare Other | Admitting: Pulmonary Disease

## 2022-11-24 DIAGNOSIS — M5030 Other cervical disc degeneration, unspecified cervical region: Secondary | ICD-10-CM | POA: Diagnosis not present

## 2022-11-24 DIAGNOSIS — M47812 Spondylosis without myelopathy or radiculopathy, cervical region: Secondary | ICD-10-CM | POA: Diagnosis not present

## 2022-11-24 DIAGNOSIS — M1712 Unilateral primary osteoarthritis, left knee: Secondary | ICD-10-CM | POA: Diagnosis not present

## 2022-11-24 DIAGNOSIS — M545 Low back pain, unspecified: Secondary | ICD-10-CM | POA: Diagnosis not present

## 2022-11-24 DIAGNOSIS — M25561 Pain in right knee: Secondary | ICD-10-CM | POA: Diagnosis not present

## 2022-11-24 DIAGNOSIS — M25562 Pain in left knee: Secondary | ICD-10-CM | POA: Diagnosis not present

## 2022-11-25 ENCOUNTER — Ambulatory Visit (INDEPENDENT_AMBULATORY_CARE_PROVIDER_SITE_OTHER): Payer: Medicare Other | Admitting: Pulmonary Disease

## 2022-11-25 ENCOUNTER — Encounter: Payer: Self-pay | Admitting: Pulmonary Disease

## 2022-11-25 ENCOUNTER — Ambulatory Visit: Payer: Medicare Other | Admitting: Internal Medicine

## 2022-11-25 VITALS — BP 104/64 | HR 75 | Temp 98.2°F | Ht 63.0 in | Wt 125.8 lb

## 2022-11-25 DIAGNOSIS — J449 Chronic obstructive pulmonary disease, unspecified: Secondary | ICD-10-CM | POA: Diagnosis not present

## 2022-11-25 DIAGNOSIS — Z23 Encounter for immunization: Secondary | ICD-10-CM | POA: Diagnosis not present

## 2022-11-25 NOTE — Patient Instructions (Signed)
Continue current inhalers  Your CAT scan is improved from previous, no reason to repeat it at present  I will see you back in about 4  months  You are welcome to call with any significant concerns

## 2022-11-25 NOTE — Addendum Note (Signed)
Addended bySkeet Simmer, Dovey Fatzinger M on: 11/25/2022 04:00 PM   Modules accepted: Orders

## 2022-11-25 NOTE — Progress Notes (Signed)
Lisa Vega    [id number]    05-Apr-1932  Primary Care Physician:Burns, [name], MD  Referring Physician: Binnie Rail, MD [address] [place],  [place] [address]  Chief complaint:  Patient with a history of COPD In for follow-up  HPI: No recent exacerbation  Recently had a CT in August, this was repeated about 3 weeks ago -CT scan findings with tree-in-bud changes have improved -Areas of infiltrate continue to improve -Past history of lung resection for cancer  She is on Spiriva and Symbicort which she is compliant with -She does have some difficulty with inhalers but would like to continue the same at present  Rarely needs albuterol  She does cough more in the evening especially after inhaler use  I did try to convince her that she may not necessarily feel anything but we have documentation that her oxygen is running low most of the night  Had a CT scan September 2021 -reviewed, unchanged from previous  Breathing remains relatively stable at present  She does not perform any graded exercises on a regular basis, tries to walk regularly   Outpatient Encounter Medications as of 11/25/2022  Medication Sig   acetaminophen (TYLENOL) 325 MG tablet Take 650 mg by mouth every 6 (six) hours as needed for mild pain.   albuterol (PROAIR HFA) 108 (90 Base) MCG/ACT inhaler Inhale 2 puffs into the lungs every 6 (six) hours as needed for wheezing or shortness of breath.   ALPRAZolam (XANAX) 0.25 MG tablet TAKE 1 TABLET BY MOUTH EVERY DAY AS NEEDED FOR ANXIETY   aspirin EC 81 MG tablet Take 81 mg by mouth daily.   B-D TB SYRINGE 1CC/27GX1/2" 27G X 1/2" 1 ML MISC For use with methotrexate injection weekly   budesonide-formoterol (SYMBICORT) 160-4.5 MCG/ACT inhaler USE 2 INHALATIONS ORALLY   TWICE DAILY   clidinium-chlordiazePOXIDE (LIBRAX) 5-2.5 MG capsule SMARTSIG:1 Capsule(s) By Mouth Every 12 Hours PRN   estradiol (ESTRACE) 0.1 MG/GM vaginal cream Place 1 g  vaginally 3 (three) times a week.   famotidine (PEPCID) 40 MG tablet TAKE 1 TABLET DAILY   folic acid (FOLVITE) 1 MG tablet Take 1 tablet (1 mg total) by mouth daily.   gabapentin (NEURONTIN) 100 MG capsule Take 1 capsule (100 mg total) by mouth 3 (three) times daily.   ibuprofen (ADVIL,MOTRIN) 200 MG tablet Take 200 mg by mouth every 6 (six) hours as needed for headache or mild pain.   levothyroxine (SYNTHROID) 88 MCG tablet Take 1 tablet (88 mcg total) by mouth daily.   losartan (COZAAR) 25 MG tablet Take 1 tablet (25 mg total) by mouth daily.   meclizine (ANTIVERT) 25 MG tablet Take 0.5-1 tablets (12.5-25 mg total) by mouth 3 (three) times daily as needed for dizziness.   Methotrexate Sodium (METHOTREXATE, PF,) 50 MG/2ML injection INJECT 0.7 MLS (17.5 MG TOTAL) INTO THE SKIN ONCE A WEEK.   mupirocin ointment (BACTROBAN) 2 % Place 1 application into the nose 2 (two) times daily.   omeprazole (PRILOSEC) 40 MG capsule Take 40 mg by mouth daily.   ONETOUCH DELICA LANCETS 30Q MISC USE TO CHECK SUGARS UP TO 4 TIMES A DAY   ONETOUCH ULTRA test strip USE TO CHECK BLOOD SUGARS TWICE A DAY   Plecanatide (TRULANCE) 3 MG TABS Take 3 mg by mouth as needed. (Samples from GI)   prednisoLONE acetate (PRED FORTE) 1 % ophthalmic suspension 1 drop 2 (two) times daily.   sertraline (ZOLOFT)  100 MG tablet TAKE 1 TABLET BY MOUTH EVERY DAY   SPIRIVA RESPIMAT 2.5 MCG/ACT AERS USE 2 INHALATIONS ORALLY   DAILY (Patient taking differently: Inhale 2 puffs into the lungs daily as needed.)   traMADol (ULTRAM) 50 MG tablet Take 50 mg by mouth 3 (three) times daily as needed.   triamcinolone ointment (KENALOG) 0.1 % Apply 1 Application topically 2 (two) times daily as needed.   predniSONE (DELTASONE) 10 MG tablet 4 X 2 DAYS, 3 X 2 DAYS, 2 X 2 DAYS, 1 X 2 DAYS (Patient not taking: Reported on 11/25/2022)   rosuvastatin (CRESTOR) 5 MG tablet Take 1 tablet (5 mg total) by mouth daily.   No facility-administered encounter  medications on file as of 11/25/2022.    Allergies as of 11/25/2022 - Review Complete 11/25/2022  Allergen Reaction Noted   Codeine Other (See Comments)    Meperidine hcl Nausea And Vomiting    Demerol Other (See Comments) 08/10/2011   Escitalopram Other (See Comments) 01/21/2022   Meperidine hcl Other (See Comments) 05/02/2018   Other Other (See Comments) 10/04/2020   Sulfa antibiotics Rash 10/04/2020    Past Medical History:  Diagnosis Date   Anxiety    Arthritis    RA   BPV (benign positional vertigo) 03/10/2018   Cancer ([place])    lung ca   Carotid stenosis, left 09/19/2018   COPD (chronic obstructive pulmonary disease) ([place])    Depression    Dyspnea    with exertion   GERD (gastroesophageal reflux disease)    Headache syndrome 03/09/2019   Hypercholesteremia    Hypothyroidism    Memory difficulties 10/28/2015   Pneumonia     x3  last time 2017   Pre-diabetes    Rheumatoid arthritis(714.0)    Thyroid disease    Tremor 10/28/2015   Jaw tremor   Uterine prolapse    Vitamin D deficiency     Past Surgical History:  Procedure Laterality Date   ANGIOPLASTY Left 10/10/2018   Procedure: ANGIOPLASTY using 1cm x 14cm Xenosure Patch;  Surgeon: Marty Heck, MD;  Location: [facility] OR;  Service: Vascular;  Laterality: Left;   APPENDECTOMY     COLONOSCOPY W/ POLYPECTOMY     ENDARTERECTOMY Left 10/10/2018   Procedure: ENDARTERECTOMY CAROTID;  Surgeon: Marty Heck, MD;  Location: [facility] OR;  Service: Vascular;  Laterality: Left;   EYE SURGERY Bilateral    cataract   FOOT SURGERY Bilateral    arthritis   Hands  Bilateral    Arthritis   HEMORRHOID SURGERY     right lobectomy  10/2004   TONSILLECTOMY AND ADENOIDECTOMY      Family History  Problem Relation Age of Onset   Emphysema Mother    Asthma Mother    Lung cancer Father    Pancreatic cancer Father    Bone cancer Father    Heart disease Brother        x1   Diabetes Brother    Hypertension Brother     Lupus Brother    Heart disease Sister        x2   Diabetes Sister    Hypertension Sister    Diabetes Sister    Hypertension Sister    Diabetes Brother    Hypertension Brother    Stroke Maternal Grandfather    Hypertension Son    Tremor Son     Social History   Socioeconomic History   Marital status: Widowed    Spouse name: Not on file  Number of children: 3   Years of education: LPN   Highest education level: Not on file  Occupational History   Occupation: retired    Fish farm manager: RETIRED    Comment: LPN at [employer] Use   Smoking status: Former    Packs/day: 1.00    Years: 20.00    Total pack years: 20.00    Types: Cigarettes    Quit date: 11/10/2004    Years since quitting: 18.0   Smokeless tobacco: Never  Vaping Use   Vaping Use: Never used  Substance and Sexual Activity   Alcohol use: Yes    Alcohol/week: 1.0 standard drink of alcohol    Types: 1 Glasses of wine per week    Comment: socially   Drug use: No   Sexual activity: Not Currently  Other Topics Concern   Not on file  Social History Narrative   09/09/21 lives with dgtr Lovey Newcomer; widowed   Patient drinks about 3 cups of coffee daily.   Patient is right handed.    Retired Corporate treasurer at [employer] Strain: Low Risk  (09/12/2021)   Overall Financial Resource Strain (CARDIA)    Difficulty of Paying Living Expenses: Not hard at all  Food Insecurity: No Food Insecurity (09/12/2021)   Hunger Vital Sign    Worried About Running Out of Food in the Last Year: Never true    Ran Out of Food in the Last Year: Never true  Transportation Needs: No Transportation Needs (09/12/2021)   PRAPARE - Hydrologist (Medical): No    Lack of Transportation (Non-Medical): No  Physical Activity: Inactive (09/12/2021)   Exercise Vital Sign    Days of Exercise per Week: 0 days    Minutes of Exercise per Session: 0 min   Stress: No Stress Concern Present (09/12/2021)   [place]    Feeling of Stress : Not at all  Social Connections: Socially Isolated (09/12/2021)   Social Connection and Isolation Panel [NHANES]    Frequency of Communication with Friends and Family: More than three times a week    Frequency of Social Gatherings with Friends and Family: Once a week    Attends Religious Services: Never    Marine scientist or Organizations: No    Attends Archivist Meetings: Never    Marital Status: Widowed  Intimate Partner Violence: Not At Risk (09/12/2021)   Humiliation, Afraid, Rape, and Kick questionnaire    Fear of Current or Ex-Partner: No    Emotionally Abused: No    Physically Abused: No    Sexually Abused: No    Review of Systems  Constitutional: Negative.   HENT: Negative.    Eyes: Negative.   Respiratory: Negative.  Negative for apnea, cough and shortness of breath.   Cardiovascular:  Negative for chest pain and leg swelling.  Gastrointestinal: Negative.   Endocrine: Negative.   Genitourinary: Negative.     There were no vitals filed for this visit.    Physical Exam Constitutional:      Appearance: Normal appearance. She is well-developed.  HENT:     Head: Normocephalic and atraumatic.     Mouth/Throat:     Mouth: Mucous membranes are moist.  Eyes:     Pupils: Pupils are equal, round, and reactive to light.  Neck:     Thyroid: No thyromegaly.  Trachea: No tracheal deviation.  Cardiovascular:     Rate and Rhythm: Normal rate.  Pulmonary:     Effort: Pulmonary effort is normal. No respiratory distress.     Breath sounds: Normal breath sounds. No stridor. No wheezing, rhonchi or rales.  Musculoskeletal:     Cervical back: No rigidity or tenderness.  Neurological:     Mental Status: She is alert.  Psychiatric:        Mood and Affect: Mood normal.    Data Reviewed: Last PFT was  08/19/2018-mild obstructive lung disease  CT reviewed and discussed with patient showing some fibro-nodular changes that were stable from previous  CT scan reviewed with the patient today stable from 3 months ago will improvement in the areas showing tree-in-bud changes and atelectasis  Assessment:  COPD -Continue Spiriva, continue Symbicort -Did discuss possibility of changing to Trelegy -Did discuss possibility of using nebulization treatments -She elects to remain on the same medications at present  History of rheumatoid arthritis -On methotrexate chronically  Nocturnal desaturations -Encouraged to continue using oxygen at 2 L  Plan/Recommendations: Continue current inhalers  Graded exercise as tolerated  Follow-up in about 4 months  Encouraged to call with significant concerns  Sherrilyn Rist MD [facility] 11/25/2022, 2:08 PM  CC: Binnie Rail, MD

## 2022-11-26 ENCOUNTER — Ambulatory Visit (INDEPENDENT_AMBULATORY_CARE_PROVIDER_SITE_OTHER): Payer: Medicare Other | Admitting: Radiology

## 2022-11-26 VITALS — BP 124/74

## 2022-11-26 DIAGNOSIS — N814 Uterovaginal prolapse, unspecified: Secondary | ICD-10-CM

## 2022-11-26 DIAGNOSIS — Z4689 Encounter for fitting and adjustment of other specified devices: Secondary | ICD-10-CM

## 2022-11-26 DIAGNOSIS — R350 Frequency of micturition: Secondary | ICD-10-CM | POA: Diagnosis not present

## 2022-11-26 NOTE — Progress Notes (Signed)
   Lisa Vega 03-30-1932 [id number]   History: Postmenopausal 88 y.o. Here for pessary cleaning.  Complains of urinary frequency.  Past Medical History:  Diagnosis Date   Anxiety    Arthritis    RA   BPV (benign positional vertigo) 03/10/2018   Cancer ([place])    lung ca   Carotid stenosis, left 09/19/2018   COPD (chronic obstructive pulmonary disease) ([facility])    Depression    Dyspnea    with exertion   GERD (gastroesophageal reflux disease)    Headache syndrome 03/09/2019   Hypercholesteremia    Hypothyroidism    Memory difficulties 10/28/2015   Pneumonia     x3  last time 2017   Pre-diabetes    Rheumatoid arthritis(714.0)    Thyroid disease    Tremor 10/28/2015   Jaw tremor   Uterine prolapse    Vitamin D deficiency      Obstetric History OB History  Gravida Para Term Preterm AB Living  5 3     2 3   SAB IAB Ectopic Multiple Live Births  1   1        # Outcome Date GA Lbr Len/2nd Weight Sex Delivery Anes PTL Lv  5 Ectopic           4 SAB           3 Para           2 Para           1 Para              The following portions of the patient's history were reviewed and updated as appropriate: allergies, current medications, past family history, past medical history, past social history, past surgical history, and problem list.  Review of Systems Pertinent items noted in HPI and remainder of comprehensive ROS otherwise negative.  Past medical history, past surgical history, family history and social history were all reviewed and documented in the EPIC chart.  Exam:  Vitals:   11/26/22 1124  BP: 124/74   There is no height or weight on file to calculate BMI.  General appearance:  Normal Genitourinary   Inguinal/mons:  Normal without inguinal adenopathy  External genitalia:  punctuated rash on inner groin/crease  BUS/Urethra/Skene's glands:  Normal  Vagina:   #8 shaatz (retrofitted from [employer]) pessary removed with ring forceps, cleaned and replaced.  Scant bleeding noticed at introitus. Moderate atrophy of the vagina  Urine dipstick shows positive for RBC's, positive for protein, and positive for leukocytes.  Micro exam: 40-60 WBC's per HPF, 0-2 RBC's per HPF, and moderate+ bacteria.    Lisa Vega, CMA present for exam  Assessment/Plan:   1. Urinary frequency - Urinalysis,Complete w/RFL Culture  2. Cystocele with uterine prolapse  3. Encounter for fitting and adjustment of pessary    Lisa Vega B WHNP-BC, 11:50 AM 11/26/2022

## 2022-11-28 LAB — URINALYSIS, COMPLETE W/RFL CULTURE
Bilirubin Urine: NEGATIVE
Glucose, UA: NEGATIVE
Hyaline Cast: NONE SEEN /LPF
Ketones, ur: NEGATIVE
Nitrites, Initial: NEGATIVE
Specific Gravity, Urine: 1.015 (ref 1.001–1.035)
pH: 6 (ref 5.0–8.0)

## 2022-11-28 LAB — CULTURE INDICATED

## 2022-11-28 LAB — URINE CULTURE
MICRO NUMBER:: 14251978
SPECIMEN QUALITY:: ADEQUATE

## 2022-12-02 ENCOUNTER — Ambulatory Visit: Payer: Medicare Other | Attending: Internal Medicine | Admitting: Internal Medicine

## 2022-12-02 ENCOUNTER — Encounter: Payer: Self-pay | Admitting: Internal Medicine

## 2022-12-02 VITALS — BP 96/58 | HR 66 | Resp 15 | Ht 63.0 in | Wt 123.0 lb

## 2022-12-02 DIAGNOSIS — G4486 Cervicogenic headache: Secondary | ICD-10-CM | POA: Insufficient documentation

## 2022-12-02 DIAGNOSIS — Z79899 Other long term (current) drug therapy: Secondary | ICD-10-CM | POA: Diagnosis not present

## 2022-12-02 DIAGNOSIS — M1811 Unilateral primary osteoarthritis of first carpometacarpal joint, right hand: Secondary | ICD-10-CM | POA: Diagnosis not present

## 2022-12-02 DIAGNOSIS — M069 Rheumatoid arthritis, unspecified: Secondary | ICD-10-CM | POA: Diagnosis not present

## 2022-12-02 NOTE — Progress Notes (Unsigned)
Office Visit Note  Patient: Lisa Vega             Date of Birth: 04/17/1932           MRN: [id number]             PCP: Binnie Rail, MD Referring: Binnie Rail, MD Visit Date: 12/02/2022   Subjective:  Follow-up (Doing good)   History of Present Illness: Lisa Vega is a 88 y.o. female here for follow up here for seropositive RA on MTX 17.5 mg [place] weekly and folic acid 1 mg daily.***   Doing worse lately, low back on right side if bothering her a lot. Knees injected on Friday without improvement usually this was more helpful. She is frustrated about chronic symptoms and more fatigued and achy than usual. Shoulders not as much complaint today. ***  Previous HPI 08/26/22 Lisa Vega is a 88 y.o. female here for follow up for seropositive RA on methotrexate 17.5 mg subcu weekly and folic acid 1 mg daily.  Since her last visit she had some major event developing a bilateral eye infection with inflammation more on the right than left side with temporary swelling and vision blurring associated.  This is being treated with topical treatments including steroid drops.  Subsequently also developed a COPD exacerbation which took a few weeks to fully resolve.  With her joint pains she had repeat steroid shot with Dr. Herma Mering about 3 months ago with improvement but knees her very frequently.  Since about 2 days ago she also experiencing pain at the left shoulder this is achy and becomes painful with certain overhead movements also cannot tolerate lying to the left side in bed.   Previous HPI Lisa Vega is a 88 y.o. female here for follow up for seropositive RA on MTX 17.5 mg [place] weekly and folic acid 1 mg daily.  She had recent bilateral knee steroid injections with Dr. Nelva Bush which are very helpful. Her left shoulder continues to be moderately painful she takes some ibuprofen when it is very bad. She is currently wearing a heart monitor for arrhythmia. Her blood pressure is lower  than normal she does feel slightly lightheaded but not particularly orthostatic symptoms no new swelling or dyspnea.   Previous HPI 10/20/21 Lisa Vega is a 88 y.o. female here for follow up for erosive RA on methotrexate 17.5 mg [place] weekly. Since our last visit she has significant ongoing joint pain but no severe or unbearable flare up. She has been seeing Dr. Nelva Bush for many years with good improvement to intraarticular injections and feels like it is about time, her left knee pain is acting up. She had at least one episode of right wrist swelling and erythema about a month ago. She has some urinary frequency problems evaluated recently for UTI this appeared to be negative, old pessary was recently removed and she is gong to see urology/gynecology new office tomorrow.    Previous HPI 12/06/20 History of Present Illness: Lisa Vega is a 88 y.o. female with a history of non-small cell lung cancer status post surgical resection and chemotherapy, hypothyroidism, osteoporosis, and COPD here to establish care for rheumatoid arthritis currently treated with methotrexate subcutaneous 17.5 mg weekly.  She was originally diagnosed around 40 years ago in [place] she to treatment with gold injections that were not effective.  She later treated with oral methotrexate that was poorly tolerated with rashes at even low-dose.  She was switched  to subcutaneous methotrexate which she has been taking for at least 20 years.  Since moving to this area she is seeing rheumatology care with Dr. Justine Null, Dr. Rockwell [place], Dr. Trudie Reed, and now seeks to transfer care for an office that is closer to her daughter's home where she has started living due to increased difficulties with independent living and commuting.   She has extensive long-term damage from her rheumatoid arthritis with subluxation deviation and deformity of the digits on both hands and both feet.  She is also had some involvement of the shoulders and wrists but  not as severe.  She has degenerative back disease, cervical disc disease, and bilateral osteoarthritis of the knees.  The trouble with her knees especially with ulcer and deformity of the right foot significantly limit her mobility at this time.  She has not noticed a lot of joint swelling redness or heat recently and has not required recent steroid treatments.  She has had some benefit with intra-articular injections for the knees also had some injections for the neck and upper back with mixed results.  She also describes some nodules in the skin of her upper arms and on her hands that she thinks are more noticeable over the past several months.   She has a history of non-small cell lung cancer that was found with a rapidly enlarging left lung nodule.  She was a former cigarette smoker with COPD but no longer.  She denies a known history of interstitial lung disease but has discussed difficulty say whether methotrexate has contributed a lung toxicity versus just her RA.   Review of Systems  Constitutional:  Positive for fatigue.  HENT:  Positive for mouth dryness. Negative for mouth sores.   Eyes:  Positive for dryness.  Respiratory:  Positive for shortness of breath.   Cardiovascular:  Positive for palpitations. Negative for chest pain.  Gastrointestinal:  Positive for constipation. Negative for blood in stool and diarrhea.  Endocrine: Positive for increased urination.  Genitourinary:  Negative for involuntary urination.  Musculoskeletal:  Positive for joint pain, gait problem, joint pain, myalgias, muscle weakness, morning stiffness and myalgias. Negative for joint swelling and muscle tenderness.  Skin:  Negative for color change, rash, hair loss and sensitivity to sunlight.  Allergic/Immunologic: Negative for susceptible to infections.  Neurological:  Positive for dizziness and headaches.  Hematological:  Positive for swollen glands.  Psychiatric/Behavioral:  Positive for depressed mood.  Negative for sleep disturbance. The patient is nervous/anxious.     PMFS History:  Patient Active Problem List   Diagnosis Date Noted   Sinusitis 02/09/2022   Acute respiratory failure ([place]) 01/29/2022   Pain in left shoulder 01/20/2022   Pain of left hip joint 01/12/2022   Stress incontinence 11/07/2021   Urinary frequency 10/20/2021   Arthralgia of lower leg 02/24/2021   On long term drug therapy 02/24/2021   Myalgia 02/24/2021   Anxiety state 02/24/2021   Diverticular disease of colon 02/24/2021   Dysphagia 02/24/2021   Essential tremor 02/24/2021   History of colonic polyps 02/24/2021   Mixed hyperlipidemia 02/24/2021   Vitamin B12 deficiency 02/24/2021   Vitamin D deficiency 02/24/2021   Slow transit constipation 02/24/2021   COVID-19 01/28/2021   Headache, cervicogenic 01/03/2021   High risk medication use 12/06/2020   Rosacea 11/29/2020   Cervical spondylosis 04/18/2020   Primary osteoarthritis of first carpometacarpal joint of right hand 05/10/2019   Headache syndrome 03/09/2019   Carotid stenosis, left 09/19/2018   Osteoarthritis of left knee  05/12/2018   Benign positional vertigo 03/10/2018   History of lung cancer 11/29/2017   Difficulty sleeping 09/22/2017   Diabetes ([place]) 05/13/2017   Fatigue 05/12/2017   CAP (community acquired pneumonia) 05/03/2017   Orthopnea 01/12/2017   GERD (gastroesophageal reflux disease) 11/09/2016   Osteoporosis 05/06/2016   Depression 05/06/2016   Hiatal hernia 05/06/2016   Hypothyroidism 05/06/2016   Tremor 10/28/2015   Memory difficulties 10/28/2015   COPD exacerbation ([place]) 01/08/2015   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 ([place]) 05/30/2014   Diverticulitis of colon without hemorrhage 08/07/2013   Prolapse of female pelvic organs 06/22/2013   Neck pain 03/10/2013   Vaginal atrophy 03/09/2013   Rectocele 03/09/2013   Cystocele 03/09/2013   Urinary incontinence 03/09/2013   COPD (chronic obstructive pulmonary  disease) ([place]) 01/16/2011   Malignant neoplasm of bronchus and lung ([place]) 12/17/2010   Rheumatoid arthritis ([place]) 12/17/2010   Dyspnea on exertion 12/17/2010    Past Medical History:  Diagnosis Date   Anxiety    Arthritis    RA   BPV (benign positional vertigo) 03/10/2018   Cancer ([place])    lung ca   Carotid stenosis, left 09/19/2018   COPD (chronic obstructive pulmonary disease) (HCC)    Depression    Dyspnea    with exertion   GERD (gastroesophageal reflux disease)    Headache syndrome 03/09/2019   Hypercholesteremia    Hypothyroidism    Memory difficulties 10/28/2015   Pneumonia     x3  last time 2017   Pre-diabetes    Rheumatoid arthritis(714.0)    Thyroid disease    Tremor 10/28/2015   Jaw tremor   Uterine prolapse    Vitamin D deficiency     Family History  Problem Relation Age of Onset   Emphysema Mother    Asthma Mother    Lung cancer Father    Pancreatic cancer Father    Bone cancer Father    Heart disease Brother        x1   Diabetes Brother    Hypertension Brother    Lupus Brother    Heart disease Sister        x2   Diabetes Sister    Hypertension Sister    Diabetes Sister    Hypertension Sister    Diabetes Brother    Hypertension Brother    Stroke Maternal Grandfather    Hypertension Son    Tremor Son    Past Surgical History:  Procedure Laterality Date   ANGIOPLASTY Left 10/10/2018   Procedure: ANGIOPLASTY using 1cm x 14cm Xenosure Patch;  Surgeon: Marty Heck, MD;  Location: [facility] OR;  Service: Vascular;  Laterality: Left;   APPENDECTOMY     COLONOSCOPY W/ POLYPECTOMY     ENDARTERECTOMY Left 10/10/2018   Procedure: ENDARTERECTOMY CAROTID;  Surgeon: Marty Heck, MD;  Location: [facility] OR;  Service: Vascular;  Laterality: Left;   EYE SURGERY Bilateral    cataract   FOOT SURGERY Bilateral    arthritis   Hands  Bilateral    Arthritis   HEMORRHOID SURGERY     right lobectomy  10/2004   TONSILLECTOMY AND ADENOIDECTOMY     Social  History   Social History Narrative   09/09/21 lives with dgtr Lovey Newcomer; widowed   Patient drinks about 3 cups of coffee daily.   Patient is right handed.    Retired Corporate treasurer at [employer] History  Administered Date(s) Administered   Fluad Quad(high Dose 65+)  12/08/2019, 10/31/2020, 11/25/2022   Influenza Split 10/18/2009, 09/28/2011, 10/28/2011, 09/28/2015, 10/09/2015   Influenza Whole 09/27/2012   Influenza, High Dose Seasonal PF 10/28/2016, 01/12/2017, 09/22/2017, 10/03/2018   Influenza,inj,Quad PF,6+ Mos 09/20/2013, 09/12/2014   Pneumococcal Conjugate-13 10/24/2015, 10/31/2020   Pneumococcal Polysaccharide-23 12/29/2007, 07/10/2008     Objective: Vital Signs: BP (!) 96/58 (BP Location: Right Arm, Patient Position: Sitting, Cuff Size: Normal)   Pulse 66   Resp 15   Ht 5\' 3"  (1.6 m)   Wt 123 lb (55.8 kg)   BMI 21.79 kg/m    Physical Exam   Musculoskeletal Exam: ***  CDAI Exam: CDAI Score: -- Patient Global: --; Provider Global: -- Swollen: --; Tender: -- Joint Exam 12/02/2022   No joint exam has been documented for this visit   There is currently no information documented on the homunculus. Go to the Rheumatology activity and complete the homunculus joint exam.  Investigation: No additional findings.  Imaging: No results found.  Recent Labs: Lab Results  Component Value Date   WBC 9.0 08/10/2022   HGB 12.5 08/10/2022   PLT 225.0 08/10/2022   NA 137 08/10/2022   K 3.1 (L) 08/10/2022   CL 97 08/10/2022   CO2 35 (H) 08/10/2022   GLUCOSE 159 (H) 08/10/2022   BUN 10 08/10/2022   CREATININE 0.90 08/10/2022   BILITOT 0.4 08/26/2022   ALKPHOS 87 12/02/2021   AST 29 08/26/2022   ALT 15 08/26/2022   PROT 6.3 08/26/2022   ALBUMIN 3.7 12/02/2021   CALCIUM 8.5 08/10/2022   GFRAA 63 06/25/2021    Speciality Comments: No specialty comments available.  Procedures:  No procedures performed Allergies: Codeine, Meperidine hcl, Demerol,  Escitalopram, Meperidine hcl, Other, and Sulfa antibiotics   Assessment / Plan:     Visit Diagnoses: Rheumatoid arthritis involving multiple sites, unspecified whether rheumatoid factor present (HCC)  Headache, cervicogenic  Primary osteoarthritis of first carpometacarpal joint of right hand  High risk medication use  ***  Orders: No orders of the defined types were placed in this encounter.  No orders of the defined types were placed in this encounter.    Follow-Up Instructions: Return in about 3 months (around 03/03/2023) for RA on MTX f/u 48mos.   Collier Salina, MD  Note - This record has been created using [employer].  Chart creation errors have been sought, but may not always  have been located. Such creation errors do not reflect on  the standard of medical care.

## 2022-12-03 LAB — COMPLETE METABOLIC PANEL WITH GFR
AG Ratio: 1.2 (calc) (ref 1.0–2.5)
ALT: 10 U/L (ref 6–29)
AST: 20 U/L (ref 10–35)
Albumin: 3.7 g/dL (ref 3.6–5.1)
Alkaline phosphatase (APISO): 99 U/L (ref 37–153)
BUN/Creatinine Ratio: 12 (calc) (ref 6–22)
BUN: 15 mg/dL (ref 7–25)
CO2: 30 mmol/L (ref 20–32)
Calcium: 8.5 mg/dL — ABNORMAL LOW (ref 8.6–10.4)
Chloride: 101 mmol/L (ref 98–110)
Creat: 1.21 mg/dL — ABNORMAL HIGH (ref 0.60–0.95)
Globulin: 3.2 g/dL (calc) (ref 1.9–3.7)
Glucose, Bld: 142 mg/dL — ABNORMAL HIGH (ref 65–99)
Potassium: 3.3 mmol/L — ABNORMAL LOW (ref 3.5–5.3)
Sodium: 139 mmol/L (ref 135–146)
Total Bilirubin: 0.5 mg/dL (ref 0.2–1.2)
Total Protein: 6.9 g/dL (ref 6.1–8.1)
eGFR: 43 mL/min/{1.73_m2} — ABNORMAL LOW (ref >=60)

## 2022-12-03 LAB — CBC WITH DIFFERENTIAL/PLATELET
Absolute Monocytes: 1145 cells/uL — ABNORMAL HIGH (ref 200–950)
Basophils Absolute: 119 cells/uL (ref 0–200)
Basophils Relative: 1.1 %
Eosinophils Absolute: 205 cells/uL (ref 15–500)
Eosinophils Relative: 1.9 %
HCT: 39.4 % (ref 35.0–45.0)
Hemoglobin: 12.6 g/dL (ref 11.7–15.5)
Lymphs Abs: 4320 cells/uL — ABNORMAL HIGH (ref 850–3900)
MCH: 30.2 pg (ref 27.0–33.0)
MCHC: 32 g/dL (ref 32.0–36.0)
MCV: 94.5 fL (ref 80.0–100.0)
MPV: 11.4 fL (ref 7.5–12.5)
Monocytes Relative: 10.6 %
Neutro Abs: 5011 cells/uL (ref 1500–7800)
Neutrophils Relative %: 46.4 %
Platelets: 165 10*3/uL (ref 140–400)
RBC: 4.17 10*6/uL (ref 3.80–5.10)
RDW: 15.3 % — ABNORMAL HIGH (ref 11.0–15.0)
Total Lymphocyte: 40 %
WBC: 10.8 10*3/uL (ref 3.8–10.8)

## 2022-12-03 LAB — SEDIMENTATION RATE: Sed Rate: 34 mm/h — ABNORMAL HIGH (ref 0–30)

## 2022-12-03 NOTE — Progress Notes (Signed)
Sedimentation rate is 34 this is slightly better from 43 3 months ago. Creatinine is increased from 0.9 to 1.21 this is a marker of kidney function that is slightly worse.  This is currently okay for continuing methotrexate. Did she have any medication changes or recent illness?

## 2022-12-09 ENCOUNTER — Ambulatory Visit: Payer: Medicare Other | Admitting: Radiology

## 2022-12-10 ENCOUNTER — Ambulatory Visit: Payer: Medicare Other | Admitting: Nurse Practitioner

## 2022-12-10 NOTE — Patient Instructions (Addendum)
       Have an xray downstairs.       Blood work was ordered.   The lab is on the first floor.    Medications changes include :   Augmentin for a UTI      Return in about 6 months (around 06/12/2023) for follow up.

## 2022-12-10 NOTE — Progress Notes (Signed)
Subjective:    Patient ID: Lisa Vega, female    DOB: 04-22-1932, 88 y.o.   MRN: [id number]     HPI Lisa Vega is here for follow up of her chronic medical problems, including hypothyroid, DM, hld, depression, anxiety, GERD, RA  Lower back pain x 2 week - is is across the back and sometimes goes into the groin. Sometimes pain is into the legs.  No injury or heavy lifting.   Having urine symptoms -she thinks she may have a UTI which she gets frequently.  She has had some discomfort with urination and frequency.  Medications and allergies reviewed with patient and updated if appropriate.  Current Outpatient Medications on File Prior to Visit  Medication Sig Dispense Refill   acetaminophen (TYLENOL) 325 MG tablet Take 650 mg by mouth every 6 (six) hours as needed for mild pain.     albuterol (PROAIR HFA) 108 (90 Base) MCG/ACT inhaler Inhale 2 puffs into the lungs every 6 (six) hours as needed for wheezing or shortness of breath. 8.5 g 5   ALPRAZolam (XANAX) 0.25 MG tablet TAKE 1 TABLET BY MOUTH EVERY DAY AS NEEDED FOR ANXIETY 30 tablet 2   aspirin EC 81 MG tablet Take 81 mg by mouth daily.     B-D TB SYRINGE 1CC/27GX1/2" 27G X 1/2" 1 ML MISC For use with methotrexate injection weekly 25 each 1   budesonide-formoterol (SYMBICORT) 160-4.5 MCG/ACT inhaler USE 2 INHALATIONS ORALLY   TWICE DAILY 30.6 g 2   clidinium-chlordiazePOXIDE (LIBRAX) 5-2.5 MG capsule SMARTSIG:1 Capsule(s) By Mouth Every 12 Hours PRN     estradiol (ESTRACE) 0.1 MG/GM vaginal cream Place 1 g vaginally 3 (three) times a week. 42.5 g 6   famotidine (PEPCID) 40 MG tablet TAKE 1 TABLET DAILY 90 tablet 3   folic acid (FOLVITE) 1 MG tablet Take 1 tablet (1 mg total) by mouth daily. 90 tablet 1   gabapentin (NEURONTIN) 100 MG capsule Take 1 capsule (100 mg total) by mouth 3 (three) times daily. 270 capsule 3   ibuprofen (ADVIL,MOTRIN) 200 MG tablet Take 200 mg by mouth every 6 (six) hours as needed for headache or mild  pain.     levothyroxine (SYNTHROID) 88 MCG tablet Take 1 tablet (88 mcg total) by mouth daily. 90 tablet 1   losartan (COZAAR) 25 MG tablet Take 1 tablet (25 mg total) by mouth daily. 90 tablet 2   meclizine (ANTIVERT) 25 MG tablet Take 0.5-1 tablets (12.5-25 mg total) by mouth 3 (three) times daily as needed for dizziness. 60 tablet 5   Methotrexate Sodium (METHOTREXATE, PF,) 50 MG/2ML injection INJECT 0.7 MLS (17.5 MG TOTAL) INTO THE SKIN ONCE A WEEK. 4 mL 2   mupirocin ointment (BACTROBAN) 2 % Place 1 application into the nose 2 (two) times daily. 22 g 1   omeprazole (PRILOSEC) 40 MG capsule Take 40 mg by mouth daily.     ONETOUCH DELICA LANCETS 65H MISC USE TO CHECK SUGARS UP TO 4 TIMES A DAY  0   ONETOUCH ULTRA test strip USE TO CHECK BLOOD SUGARS TWICE A DAY 100 strip 5   Plecanatide (TRULANCE) 3 MG TABS Take 3 mg by mouth as needed. (Samples from GI)     prednisoLONE acetate (PRED FORTE) 1 % ophthalmic suspension 1 drop 2 (two) times daily.     sertraline (ZOLOFT) 100 MG tablet TAKE 1 TABLET BY MOUTH EVERY DAY 90 tablet 1   SPIRIVA RESPIMAT 2.5 MCG/ACT AERS USE  2 INHALATIONS ORALLY   DAILY (Patient taking differently: Inhale 2 puffs into the lungs daily as needed.) 12 g 1   traMADol (ULTRAM) 50 MG tablet Take 50 mg by mouth 3 (three) times daily as needed.     triamcinolone ointment (KENALOG) 0.1 % Apply 1 Application topically 2 (two) times daily as needed. 30 g 3   rosuvastatin (CRESTOR) 5 MG tablet Take 1 tablet (5 mg total) by mouth daily. 90 tablet 3   No current facility-administered medications on file prior to visit.     Review of Systems  Constitutional:  Negative for fever.  Respiratory:  Positive for cough and shortness of breath. Negative for wheezing.   Cardiovascular:  Positive for palpitations. Negative for chest pain and leg swelling.  Genitourinary:  Positive for dysuria, frequency and hematuria (? on TP).  Musculoskeletal:  Positive for back pain.  Neurological:   Positive for numbness (sometimes in legs). Negative for headaches (head pain - in different areas of the head).       Objective:   Vitals:   12/11/22 1403  BP: 106/62  Pulse: 63  Temp: 97.9 F (36.6 C)  SpO2: 97%   BP Readings from Last 3 Encounters:  12/11/22 106/62  12/02/22 (!) 96/58  11/26/22 124/74   Wt Readings from Last 3 Encounters:  12/11/22 121 lb (54.9 kg)  12/02/22 123 lb (55.8 kg)  11/25/22 125 lb 12.8 oz (57.1 kg)   Body mass index is 21.43 kg/m.    Physical Exam Constitutional:      General: She is not in acute distress.    Appearance: Normal appearance.  HENT:     Head: Normocephalic and atraumatic.  Eyes:     Conjunctiva/sclera: Conjunctivae normal.  Cardiovascular:     Rate and Rhythm: Normal rate and regular rhythm.     Heart sounds: Normal heart sounds. No murmur heard. Pulmonary:     Effort: Pulmonary effort is normal. No respiratory distress.     Breath sounds: Normal breath sounds. No wheezing.  Musculoskeletal:     Cervical back: Neck supple.     Right lower leg: No edema.     Left lower leg: No edema.  Lymphadenopathy:     Cervical: No cervical adenopathy.  Skin:    General: Skin is warm and dry.     Findings: No rash.  Neurological:     Mental Status: She is alert. Mental status is at baseline.  Psychiatric:        Mood and Affect: Mood normal.        Behavior: Behavior normal.        Lab Results  Component Value Date   WBC 10.8 12/02/2022   HGB 12.6 12/02/2022   HCT 39.4 12/02/2022   PLT 165 12/02/2022   GLUCOSE 142 (H) 12/02/2022   CHOL 148 07/27/2022   TRIG 114 07/27/2022   HDL 60 07/27/2022   LDLCALC 68 07/27/2022   ALT 10 12/02/2022   AST 20 12/02/2022   NA 139 12/02/2022   K 3.3 (L) 12/02/2022   CL 101 12/02/2022   CREATININE 1.21 (H) 12/02/2022   BUN 15 12/02/2022   CO2 30 12/02/2022   TSH 0.72 12/02/2021   INR 1.08 10/06/2018   HGBA1C 5.6 06/11/2022   HGBA1C 5.6 06/11/2022   HGBA1C 5.6 (A) 06/11/2022    HGBA1C 5.6 06/11/2022   MICROALBUR 2.2 (H) 06/02/2021     Assessment & Plan:    See Problem List for Assessment and Plan  of chronic medical problems.

## 2022-12-11 ENCOUNTER — Encounter: Payer: Self-pay | Admitting: Internal Medicine

## 2022-12-11 ENCOUNTER — Ambulatory Visit (INDEPENDENT_AMBULATORY_CARE_PROVIDER_SITE_OTHER): Payer: Medicare Other

## 2022-12-11 ENCOUNTER — Other Ambulatory Visit: Payer: Self-pay

## 2022-12-11 ENCOUNTER — Ambulatory Visit (INDEPENDENT_AMBULATORY_CARE_PROVIDER_SITE_OTHER): Payer: Medicare Other | Admitting: Internal Medicine

## 2022-12-11 ENCOUNTER — Other Ambulatory Visit (INDEPENDENT_AMBULATORY_CARE_PROVIDER_SITE_OTHER): Payer: Medicare Other

## 2022-12-11 VITALS — BP 106/62 | HR 63 | Temp 97.9°F | Ht 63.0 in | Wt 121.0 lb

## 2022-12-11 DIAGNOSIS — E119 Type 2 diabetes mellitus without complications: Secondary | ICD-10-CM | POA: Diagnosis not present

## 2022-12-11 DIAGNOSIS — K219 Gastro-esophageal reflux disease without esophagitis: Secondary | ICD-10-CM

## 2022-12-11 DIAGNOSIS — F411 Generalized anxiety disorder: Secondary | ICD-10-CM

## 2022-12-11 DIAGNOSIS — F3289 Other specified depressive episodes: Secondary | ICD-10-CM | POA: Diagnosis not present

## 2022-12-11 DIAGNOSIS — E538 Deficiency of other specified B group vitamins: Secondary | ICD-10-CM

## 2022-12-11 DIAGNOSIS — E038 Other specified hypothyroidism: Secondary | ICD-10-CM | POA: Diagnosis not present

## 2022-12-11 DIAGNOSIS — E559 Vitamin D deficiency, unspecified: Secondary | ICD-10-CM

## 2022-12-11 DIAGNOSIS — R300 Dysuria: Secondary | ICD-10-CM

## 2022-12-11 DIAGNOSIS — E782 Mixed hyperlipidemia: Secondary | ICD-10-CM

## 2022-12-11 DIAGNOSIS — M545 Low back pain, unspecified: Secondary | ICD-10-CM

## 2022-12-11 DIAGNOSIS — I6523 Occlusion and stenosis of bilateral carotid arteries: Secondary | ICD-10-CM | POA: Diagnosis not present

## 2022-12-11 DIAGNOSIS — M8588 Other specified disorders of bone density and structure, other site: Secondary | ICD-10-CM | POA: Diagnosis not present

## 2022-12-11 DIAGNOSIS — M069 Rheumatoid arthritis, unspecified: Secondary | ICD-10-CM | POA: Diagnosis not present

## 2022-12-11 LAB — COMPREHENSIVE METABOLIC PANEL
ALT: 23 U/L (ref 0–35)
AST: 47 U/L — ABNORMAL HIGH (ref 0–37)
Albumin: 3.7 g/dL (ref 3.5–5.2)
Alkaline Phosphatase: 107 U/L (ref 39–117)
BUN: 13 mg/dL (ref 6–23)
CO2: 30 mEq/L (ref 19–32)
Calcium: 9.2 mg/dL (ref 8.4–10.5)
Chloride: 101 mEq/L (ref 96–112)
Creatinine, Ser: 1.08 mg/dL (ref 0.40–1.20)
GFR: 45.96 mL/min — ABNORMAL LOW (ref >60.00)
Glucose, Bld: 152 mg/dL — ABNORMAL HIGH (ref 70–99)
Potassium: 3.6 mEq/L (ref 3.5–5.1)
Sodium: 139 mEq/L (ref 135–145)
Total Bilirubin: 0.7 mg/dL (ref 0.2–1.2)
Total Protein: 7 g/dL (ref 6.0–8.3)

## 2022-12-11 LAB — VITAMIN D 25 HYDROXY (VIT D DEFICIENCY, FRACTURES): VITD: 37.25 ng/mL (ref 30.00–100.00)

## 2022-12-11 LAB — VITAMIN B12: Vitamin B-12: 270 pg/mL (ref 211–911)

## 2022-12-11 LAB — POC URINALSYSI DIPSTICK (AUTOMATED)
Bilirubin, UA: NEGATIVE
Glucose, UA: NEGATIVE
Ketones, UA: NEGATIVE
Nitrite, UA: NEGATIVE
Protein, UA: POSITIVE — AB
Spec Grav, UA: 1.02 (ref 1.010–1.025)
Urobilinogen, UA: NEGATIVE E.U./dL — AB
pH, UA: 6 (ref 5.0–8.0)

## 2022-12-11 LAB — LIPID PANEL
Cholesterol: 137 mg/dL (ref 0–200)
HDL: 56.3 mg/dL (ref >39.00)
LDL Cholesterol: 63 mg/dL (ref 0–99)
NonHDL: 80.73
Total CHOL/HDL Ratio: 2
Triglycerides: 87 mg/dL (ref 0.0–149.0)
VLDL: 17.4 mg/dL (ref 0.0–40.0)

## 2022-12-11 LAB — TSH: TSH: 1.5 u[IU]/mL (ref 0.35–5.50)

## 2022-12-11 LAB — HEMOGLOBIN A1C: Hgb A1c MFr Bld: 6.9 % — ABNORMAL HIGH (ref 4.6–6.5)

## 2022-12-11 MED ORDER — FLUCONAZOLE 150 MG PO TABS: 150.0000 mg | ORAL_TABLET | Freq: Once | ORAL | 0 refills | Status: AC

## 2022-12-11 MED ORDER — AMOXICILLIN-POT CLAVULANATE 875-125 MG PO TABS: 1.0000 | ORAL_TABLET | Freq: Two times a day (BID) | ORAL | 0 refills | Status: AC

## 2022-12-11 NOTE — Assessment & Plan Note (Signed)
Chronic   Lab Results  Component Value Date   HGBA1C 5.6 06/11/2022   HGBA1C 5.6 06/11/2022   HGBA1C 5.6 (A) 06/11/2022   HGBA1C 5.6 06/11/2022   Sugars well controlled Check A1c Continue lifestyle control Stressed regular exercise, diabetic diet

## 2022-12-11 NOTE — Assessment & Plan Note (Signed)
Chronic GERD controlled Continue omeprazole 40 mg twice daily, famotidine 40 mg daily

## 2022-12-11 NOTE — Assessment & Plan Note (Signed)
Chronic Taking vitamin D daily Check vitamin D level  

## 2022-12-11 NOTE — Assessment & Plan Note (Signed)
Chronic Regular exercise and healthy diet encouraged Check lipid panel  Continue Crestor 5 mg daily

## 2022-12-11 NOTE — Assessment & Plan Note (Signed)
Chronic Controlled, Stable Continue sertraline 100 mg daily

## 2022-12-11 NOTE — Assessment & Plan Note (Signed)
Chronic Controlled, Stable Continue sertraline 100 mg daily, alprazolam 0.25 mg daily as needed

## 2022-12-11 NOTE — Assessment & Plan Note (Signed)
Acute Going on for 2 weeks No injury/fall Will check x-ray given her osteoporosis Advise follow-up with Dr. Herma Mering who she sees regularly Has tramadol at home that she can take as needed for pain Can try heat, ice Deferred any other treatment at this time

## 2022-12-11 NOTE — Assessment & Plan Note (Signed)
Chronic Taking vitamin B12 Check B12 level

## 2022-12-11 NOTE — Assessment & Plan Note (Signed)
Chronic Following with rheumatology On methotrexate

## 2022-12-11 NOTE — Assessment & Plan Note (Signed)
Acute History of frequent UTIs Urine dip here suggestive of a UTI Start Augmentin 875-125 mg twice daily x 1 week Will send urine for culture

## 2022-12-11 NOTE — Assessment & Plan Note (Signed)
Chronic  Clinically euthyroid Check tsh and will titrate med dose if needed Currently taking levothyroxine 88 mcg daily

## 2022-12-13 LAB — CULTURE, URINE COMPREHENSIVE

## 2022-12-24 ENCOUNTER — Ambulatory Visit (INDEPENDENT_AMBULATORY_CARE_PROVIDER_SITE_OTHER): Payer: Medicare Other | Admitting: Podiatry

## 2022-12-24 DIAGNOSIS — E119 Type 2 diabetes mellitus without complications: Secondary | ICD-10-CM

## 2022-12-24 DIAGNOSIS — M069 Rheumatoid arthritis, unspecified: Secondary | ICD-10-CM | POA: Diagnosis not present

## 2022-12-24 DIAGNOSIS — M79675 Pain in left toe(s): Secondary | ICD-10-CM

## 2022-12-24 DIAGNOSIS — B351 Tinea unguium: Secondary | ICD-10-CM

## 2022-12-24 DIAGNOSIS — M79674 Pain in right toe(s): Secondary | ICD-10-CM

## 2022-12-24 DIAGNOSIS — L84 Corns and callosities: Secondary | ICD-10-CM | POA: Diagnosis not present

## 2022-12-24 NOTE — Progress Notes (Signed)
  Subjective:  Patient ID: Lisa Vega, female    DOB: Sep 20, 1932,  MRN: [id number]  Chief Complaint  Patient presents with   Nail Problem    ingrown great toes bilateral 2 month follow up    88 y.o. female presents with the above complaint. History confirmed with patient.  She has had thickened incurvated nails that are causing pain.  She also has painful callus on the bottom of the right foot.  She has diet-controlled type 2 diabetes, A1c is 6.9%.  She has a history of severe rheumatoid arthritis takes medication for this.  Objective:  Physical Exam: warm, good capillary refill, no trophic changes or ulcerative lesions, normal DP and PT pulses, and normal sensory exam.  Rigid hammertoe deformities and hallux valgus deformity bilateral Left Foot: dystrophic yellowed discolored nail plates with subungual debris Right Foot: dystrophic yellowed discolored nail plates with subungual debris and submetatarsal 2 callus  Assessment:   1. Pain due to onychomycosis of toenails of both feet   2. Callus of foot   3. Type 2 diabetes mellitus without complication, without long-term current use of insulin ([place])   4. Rheumatoid arthritis involving multiple sites, unspecified whether rheumatoid factor present [facility])      Plan:  Patient was evaluated and treated and all questions answered.  Discussed the etiology and treatment options for the condition in detail with the patient. Educated patient on the topical and oral treatment options for mycotic nails. Recommended debridement of the nails today. Sharp and mechanical debridement performed of all painful and mycotic nails today. Nails debrided in length and thickness using a nail nipper to level of comfort. Discussed treatment options including appropriate shoe gear. Follow up as needed for painful nails.  All symptomatic hyperkeratoses were safely debrided with a sterile #15 blade to patient's level of comfort without incident. We discussed  preventative and palliative care of these lesions including supportive and accommodative shoegear, padding, prefabricated and custom molded accommodative orthoses, use of a pumice stone and lotions/creams daily.   Return in about 3 months (around 03/25/2023) for Painful nails.

## 2022-12-24 NOTE — Patient Instructions (Signed)
Orthopedic felt or foam callus pads (horseshoe or aperture pad) can be bought on [employer] for the painful callus

## 2022-12-30 DIAGNOSIS — H10013 Acute follicular conjunctivitis, bilateral: Secondary | ICD-10-CM | POA: Diagnosis not present

## 2022-12-30 DIAGNOSIS — H18513 Endothelial corneal dystrophy, bilateral: Secondary | ICD-10-CM | POA: Diagnosis not present

## 2022-12-30 DIAGNOSIS — Z961 Presence of intraocular lens: Secondary | ICD-10-CM | POA: Diagnosis not present

## 2022-12-30 DIAGNOSIS — L718 Other rosacea: Secondary | ICD-10-CM | POA: Diagnosis not present

## 2022-12-30 DIAGNOSIS — H02403 Unspecified ptosis of bilateral eyelids: Secondary | ICD-10-CM | POA: Diagnosis not present

## 2023-01-01 ENCOUNTER — Telehealth: Payer: Self-pay | Admitting: Pulmonary Disease

## 2023-01-04 ENCOUNTER — Other Ambulatory Visit: Payer: Self-pay

## 2023-01-04 MED ORDER — BUDESONIDE-FORMOTEROL FUMARATE 160-4.5 MCG/ACT IN AERO: 2.0000 | INHALATION_SPRAY | Freq: Two times a day (BID) | RESPIRATORY_TRACT | 3 refills | Status: AC

## 2023-01-04 NOTE — Telephone Encounter (Signed)
ATC X1 per DPR left detailed voicemail of Symbicort refill being sent.

## 2023-01-06 ENCOUNTER — Other Ambulatory Visit: Payer: Self-pay

## 2023-01-06 ENCOUNTER — Inpatient Hospital Stay (HOSPITAL_COMMUNITY): Payer: Medicare Other | Admitting: Anesthesiology

## 2023-01-06 ENCOUNTER — Inpatient Hospital Stay (HOSPITAL_COMMUNITY)
Admission: EM | Admit: 2023-01-06 | Discharge: 2023-01-28 | DRG: 535 | Disposition: E | Payer: Medicare Other | Attending: Student | Admitting: Student

## 2023-01-06 ENCOUNTER — Ambulatory Visit (INDEPENDENT_AMBULATORY_CARE_PROVIDER_SITE_OTHER): Payer: Medicare Other | Admitting: Nurse Practitioner

## 2023-01-06 ENCOUNTER — Encounter: Payer: Self-pay | Admitting: Nurse Practitioner

## 2023-01-06 ENCOUNTER — Emergency Department (HOSPITAL_COMMUNITY): Payer: Medicare Other

## 2023-01-06 ENCOUNTER — Encounter (HOSPITAL_COMMUNITY): Payer: Self-pay

## 2023-01-06 ENCOUNTER — Inpatient Hospital Stay (HOSPITAL_COMMUNITY): Payer: Medicare Other

## 2023-01-06 VITALS — BP 124/80 | HR 63 | Wt 120.0 lb

## 2023-01-06 DIAGNOSIS — E782 Mixed hyperlipidemia: Secondary | ICD-10-CM | POA: Diagnosis present

## 2023-01-06 DIAGNOSIS — W1830XA Fall on same level, unspecified, initial encounter: Secondary | ICD-10-CM | POA: Diagnosis present

## 2023-01-06 DIAGNOSIS — S72002A Fracture of unspecified part of neck of left femur, initial encounter for closed fracture: Principal | ICD-10-CM | POA: Diagnosis present

## 2023-01-06 DIAGNOSIS — Z801 Family history of malignant neoplasm of trachea, bronchus and lung: Secondary | ICD-10-CM

## 2023-01-06 DIAGNOSIS — M810 Age-related osteoporosis without current pathological fracture: Secondary | ICD-10-CM | POA: Diagnosis present

## 2023-01-06 DIAGNOSIS — K219 Gastro-esophageal reflux disease without esophagitis: Secondary | ICD-10-CM | POA: Diagnosis present

## 2023-01-06 DIAGNOSIS — Z87891 Personal history of nicotine dependence: Secondary | ICD-10-CM

## 2023-01-06 DIAGNOSIS — N390 Urinary tract infection, site not specified: Secondary | ICD-10-CM | POA: Diagnosis present

## 2023-01-06 DIAGNOSIS — Z7951 Long term (current) use of inhaled steroids: Secondary | ICD-10-CM

## 2023-01-06 DIAGNOSIS — S79919A Unspecified injury of unspecified hip, initial encounter: Secondary | ICD-10-CM | POA: Diagnosis not present

## 2023-01-06 DIAGNOSIS — N3001 Acute cystitis with hematuria: Secondary | ICD-10-CM

## 2023-01-06 DIAGNOSIS — F32A Depression, unspecified: Secondary | ICD-10-CM | POA: Diagnosis present

## 2023-01-06 DIAGNOSIS — N814 Uterovaginal prolapse, unspecified: Secondary | ICD-10-CM | POA: Diagnosis present

## 2023-01-06 DIAGNOSIS — Z902 Acquired absence of lung [part of]: Secondary | ICD-10-CM

## 2023-01-06 DIAGNOSIS — R579 Shock, unspecified: Secondary | ICD-10-CM | POA: Diagnosis present

## 2023-01-06 DIAGNOSIS — J449 Chronic obstructive pulmonary disease, unspecified: Secondary | ICD-10-CM | POA: Diagnosis present

## 2023-01-06 DIAGNOSIS — G9341 Metabolic encephalopathy: Secondary | ICD-10-CM | POA: Diagnosis present

## 2023-01-06 DIAGNOSIS — Z79890 Hormone replacement therapy (postmenopausal): Secondary | ICD-10-CM

## 2023-01-06 DIAGNOSIS — M069 Rheumatoid arthritis, unspecified: Secondary | ICD-10-CM | POA: Diagnosis present

## 2023-01-06 DIAGNOSIS — N958 Other specified menopausal and perimenopausal disorders: Secondary | ICD-10-CM

## 2023-01-06 DIAGNOSIS — Z4689 Encounter for fitting and adjustment of other specified devices: Secondary | ICD-10-CM

## 2023-01-06 DIAGNOSIS — Z79818 Long term (current) use of other agents affecting estrogen receptors and estrogen levels: Secondary | ICD-10-CM

## 2023-01-06 DIAGNOSIS — Z832 Family history of diseases of the blood and blood-forming organs and certain disorders involving the immune mechanism: Secondary | ICD-10-CM

## 2023-01-06 DIAGNOSIS — E039 Hypothyroidism, unspecified: Secondary | ICD-10-CM | POA: Diagnosis present

## 2023-01-06 DIAGNOSIS — S7292XA Unspecified fracture of left femur, initial encounter for closed fracture: Principal | ICD-10-CM

## 2023-01-06 DIAGNOSIS — Z79899 Other long term (current) drug therapy: Secondary | ICD-10-CM | POA: Diagnosis not present

## 2023-01-06 DIAGNOSIS — R54 Age-related physical debility: Secondary | ICD-10-CM | POA: Diagnosis present

## 2023-01-06 DIAGNOSIS — Z043 Encounter for examination and observation following other accident: Secondary | ICD-10-CM | POA: Diagnosis not present

## 2023-01-06 DIAGNOSIS — Z7982 Long term (current) use of aspirin: Secondary | ICD-10-CM

## 2023-01-06 DIAGNOSIS — Z66 Do not resuscitate: Secondary | ICD-10-CM | POA: Diagnosis present

## 2023-01-06 DIAGNOSIS — R102 Pelvic and perineal pain: Secondary | ICD-10-CM | POA: Diagnosis not present

## 2023-01-06 DIAGNOSIS — W19XXXA Unspecified fall, initial encounter: Secondary | ICD-10-CM | POA: Diagnosis not present

## 2023-01-06 DIAGNOSIS — M1612 Unilateral primary osteoarthritis, left hip: Secondary | ICD-10-CM | POA: Diagnosis not present

## 2023-01-06 DIAGNOSIS — Z85118 Personal history of other malignant neoplasm of bronchus and lung: Secondary | ICD-10-CM

## 2023-01-06 DIAGNOSIS — Z515 Encounter for palliative care: Secondary | ICD-10-CM

## 2023-01-06 DIAGNOSIS — I160 Hypertensive urgency: Secondary | ICD-10-CM | POA: Diagnosis present

## 2023-01-06 DIAGNOSIS — Z882 Allergy status to sulfonamides status: Secondary | ICD-10-CM

## 2023-01-06 DIAGNOSIS — Z8249 Family history of ischemic heart disease and other diseases of the circulatory system: Secondary | ICD-10-CM

## 2023-01-06 DIAGNOSIS — F419 Anxiety disorder, unspecified: Secondary | ICD-10-CM | POA: Diagnosis present

## 2023-01-06 DIAGNOSIS — Z888 Allergy status to other drugs, medicaments and biological substances status: Secondary | ICD-10-CM | POA: Diagnosis not present

## 2023-01-06 DIAGNOSIS — Z825 Family history of asthma and other chronic lower respiratory diseases: Secondary | ICD-10-CM

## 2023-01-06 DIAGNOSIS — I469 Cardiac arrest, cause unspecified: Secondary | ICD-10-CM | POA: Diagnosis present

## 2023-01-06 DIAGNOSIS — Y92531 Health care provider office as the place of occurrence of the external cause: Secondary | ICD-10-CM | POA: Diagnosis not present

## 2023-01-06 DIAGNOSIS — R7303 Prediabetes: Secondary | ICD-10-CM | POA: Diagnosis present

## 2023-01-06 DIAGNOSIS — Z833 Family history of diabetes mellitus: Secondary | ICD-10-CM

## 2023-01-06 DIAGNOSIS — Z885 Allergy status to narcotic agent status: Secondary | ICD-10-CM

## 2023-01-06 DIAGNOSIS — J811 Chronic pulmonary edema: Secondary | ICD-10-CM | POA: Diagnosis not present

## 2023-01-06 DIAGNOSIS — Z01818 Encounter for other preprocedural examination: Secondary | ICD-10-CM | POA: Diagnosis not present

## 2023-01-06 DIAGNOSIS — G8911 Acute pain due to trauma: Secondary | ICD-10-CM | POA: Diagnosis not present

## 2023-01-06 DIAGNOSIS — Z823 Family history of stroke: Secondary | ICD-10-CM

## 2023-01-06 DIAGNOSIS — G9340 Encephalopathy, unspecified: Secondary | ICD-10-CM | POA: Diagnosis present

## 2023-01-06 DIAGNOSIS — Z800 Family history of malignant neoplasm of digestive organs: Secondary | ICD-10-CM

## 2023-01-06 LAB — COMPREHENSIVE METABOLIC PANEL
ALT: 32 U/L (ref 0–44)
AST: 54 U/L — ABNORMAL HIGH (ref 15–41)
Albumin: 3.2 g/dL — ABNORMAL LOW (ref 3.5–5.0)
Alkaline Phosphatase: 118 U/L (ref 38–126)
Anion gap: 8 (ref 5–15)
BUN: 13 mg/dL (ref 8–23)
CO2: 27 mmol/L (ref 22–32)
Calcium: 8.7 mg/dL — ABNORMAL LOW (ref 8.9–10.3)
Chloride: 101 mmol/L (ref 98–111)
Creatinine, Ser: 0.89 mg/dL (ref 0.44–1.00)
GFR, Estimated: >60 mL/min (ref >60)
Glucose, Bld: 111 mg/dL — ABNORMAL HIGH (ref 70–99)
Potassium: 3.9 mmol/L (ref 3.5–5.1)
Sodium: 136 mmol/L (ref 135–145)
Total Bilirubin: 0.7 mg/dL (ref 0.3–1.2)
Total Protein: 6.8 g/dL (ref 6.5–8.1)

## 2023-01-06 LAB — CBC WITH DIFFERENTIAL/PLATELET
Abs Immature Granulocytes: 0.03 10*3/uL (ref 0.00–0.07)
Basophils Absolute: 0.1 10*3/uL (ref 0.0–0.1)
Basophils Relative: 1 %
Eosinophils Absolute: 0.2 10*3/uL (ref 0.0–0.5)
Eosinophils Relative: 2 %
HCT: 39.2 % (ref 36.0–46.0)
Hemoglobin: 12.6 g/dL (ref 12.0–15.0)
Immature Granulocytes: 0 %
Lymphocytes Relative: 26 %
Lymphs Abs: 2.6 10*3/uL (ref 0.7–4.0)
MCH: 30.8 pg (ref 26.0–34.0)
MCHC: 32.1 g/dL (ref 30.0–36.0)
MCV: 95.8 fL (ref 80.0–100.0)
Monocytes Absolute: 0.6 10*3/uL (ref 0.1–1.0)
Monocytes Relative: 6 %
Neutro Abs: 6.5 10*3/uL (ref 1.7–7.7)
Neutrophils Relative %: 65 %
Platelets: 115 10*3/uL — ABNORMAL LOW (ref 150–400)
RBC: 4.09 MIL/uL (ref 3.87–5.11)
RDW: 17.2 % — ABNORMAL HIGH (ref 11.5–15.5)
WBC: 10 10*3/uL (ref 4.0–10.5)
nRBC: 0 % (ref 0.0–0.2)

## 2023-01-06 MED ORDER — ONDANSETRON HCL 4 MG/2ML IJ SOLN
4.0000 mg | Freq: Four times a day (QID) | INTRAMUSCULAR | Status: DC | PRN
  Administered 2023-01-06: 4 mg via INTRAVENOUS
  Filled 2023-01-06: qty 2

## 2023-01-06 MED ORDER — PROCHLORPERAZINE EDISYLATE 10 MG/2ML IJ SOLN
5.0000 mg | Freq: Once | INTRAMUSCULAR | Status: AC
  Administered 2023-01-06: 5 mg via INTRAVENOUS
  Filled 2023-01-06: qty 2

## 2023-01-06 MED ORDER — ASPIRIN 81 MG PO TBEC: 81.0000 mg | DELAYED_RELEASE_TABLET | Freq: Every day | ORAL | Status: DC

## 2023-01-06 MED ORDER — CILIDINIUM-CHLORDIAZEPOXIDE 2.5-5 MG PO CAPS: 1.0000 | ORAL_CAPSULE | Freq: Every day | ORAL | Status: DC

## 2023-01-06 MED ORDER — SODIUM CHLORIDE 0.9 % IV SOLN: INTRAVENOUS | Status: DC

## 2023-01-06 MED ORDER — TIOTROPIUM BROMIDE MONOHYDRATE 2.5 MCG/ACT IN AERS: 1.0000 | INHALATION_SPRAY | RESPIRATORY_TRACT | Status: DC

## 2023-01-06 MED ORDER — OXYCODONE HCL 5 MG PO TABS
5.0000 mg | ORAL_TABLET | Freq: Four times a day (QID) | ORAL | Status: DC | PRN
  Administered 2023-01-06: 5 mg via ORAL
  Filled 2023-01-06: qty 1

## 2023-01-06 MED ORDER — GLYCOPYRROLATE 1 MG PO TABS: 1.0000 mg | ORAL_TABLET | ORAL | Status: DC | PRN

## 2023-01-06 MED ORDER — ACETAMINOPHEN 650 MG RE SUPP: 650.0000 mg | Freq: Four times a day (QID) | RECTAL | Status: DC | PRN

## 2023-01-06 MED ORDER — ACETAMINOPHEN 325 MG PO TABS: 650.0000 mg | ORAL_TABLET | Freq: Four times a day (QID) | ORAL | Status: DC | PRN

## 2023-01-06 MED ORDER — HYDRALAZINE HCL 20 MG/ML IJ SOLN: 10.0000 mg | INTRAMUSCULAR | Status: DC | PRN

## 2023-01-06 MED ORDER — EPINEPHRINE HCL 5 MG/250ML IV SOLN IN NS
0.5000 ug/min | INTRAVENOUS | Status: DC
  Administered 2023-01-06: 20 ug/min via INTRAVENOUS
  Filled 2023-01-06 (×2): qty 250

## 2023-01-06 MED ORDER — HYDROMORPHONE HCL 1 MG/ML IJ SOLN: 0.5000 mg | INTRAMUSCULAR | Status: DC | PRN

## 2023-01-06 MED ORDER — MOMETASONE FURO-FORMOTEROL FUM 200-5 MCG/ACT IN AERO
2.0000 | INHALATION_SPRAY | Freq: Two times a day (BID) | RESPIRATORY_TRACT | Status: DC
  Filled 2023-01-06: qty 8.8

## 2023-01-06 MED ORDER — PANTOPRAZOLE SODIUM 40 MG PO TBEC: 40.0000 mg | DELAYED_RELEASE_TABLET | Freq: Every day | ORAL | Status: DC

## 2023-01-06 MED ORDER — PANTOPRAZOLE SODIUM 40 MG IV SOLR: 40.0000 mg | Freq: Once | INTRAVENOUS | Status: DC

## 2023-01-06 MED ORDER — ALBUTEROL SULFATE (2.5 MG/3ML) 0.083% IN NEBU: 2.5000 mg | INHALATION_SOLUTION | Freq: Four times a day (QID) | RESPIRATORY_TRACT | Status: DC | PRN

## 2023-01-06 MED ORDER — SODIUM CHLORIDE (HYPERTONIC) 5 % OP OINT: 1.0000 | TOPICAL_OINTMENT | Freq: Every day | OPHTHALMIC | Status: DC

## 2023-01-06 MED ORDER — ROSUVASTATIN CALCIUM 10 MG PO TABS: 5.0000 mg | ORAL_TABLET | Freq: Every day | ORAL | Status: DC

## 2023-01-06 MED ORDER — MORPHINE 100MG IN NS 100ML (1MG/ML) PREMIX INFUSION: 1.0000 mg/h | INTRAVENOUS | Status: DC

## 2023-01-06 MED ORDER — LEVOTHYROXINE SODIUM 88 MCG PO TABS: 88.0000 ug | ORAL_TABLET | Freq: Every day | ORAL | Status: DC

## 2023-01-06 MED ORDER — POLYVINYL ALCOHOL 1.4 % OP SOLN: 1.0000 [drp] | Freq: Four times a day (QID) | OPHTHALMIC | Status: DC | PRN

## 2023-01-06 MED ORDER — ROPIVACAINE HCL 5 MG/ML IJ SOLN
INTRAMUSCULAR | Status: DC | PRN
  Administered 2023-01-06: 20 mL via PERINEURAL

## 2023-01-06 MED ORDER — KETOROLAC TROMETHAMINE 15 MG/ML IJ SOLN: 15.0000 mg | Freq: Once | INTRAMUSCULAR | Status: DC

## 2023-01-06 MED ORDER — ALPRAZOLAM 0.25 MG PO TABS: 0.2500 mg | ORAL_TABLET | Freq: Every day | ORAL | Status: DC | PRN

## 2023-01-06 MED ORDER — FENTANYL CITRATE PF 50 MCG/ML IJ SOSY
50.0000 ug | PREFILLED_SYRINGE | Freq: Once | INTRAMUSCULAR | Status: AC
  Administered 2023-01-06: 50 ug via INTRAVENOUS
  Filled 2023-01-06: qty 1

## 2023-01-06 MED ORDER — ALBUTEROL SULFATE HFA 108 (90 BASE) MCG/ACT IN AERS: 2.0000 | INHALATION_SPRAY | Freq: Four times a day (QID) | RESPIRATORY_TRACT | Status: DC | PRN

## 2023-01-06 MED ORDER — GLYCOPYRROLATE 0.2 MG/ML IJ SOLN
0.2000 mg | INTRAMUSCULAR | Status: DC | PRN
  Filled 2023-01-06: qty 1

## 2023-01-06 MED ORDER — FENTANYL CITRATE PF 50 MCG/ML IJ SOSY
50.0000 ug | PREFILLED_SYRINGE | Freq: Once | INTRAMUSCULAR | Status: DC
  Filled 2023-01-06: qty 2

## 2023-01-06 MED ORDER — LORAZEPAM 2 MG/ML IJ SOLN
2.0000 mg | INTRAMUSCULAR | Status: DC | PRN
  Administered 2023-01-06: 4 mg via INTRAVENOUS
  Filled 2023-01-06: qty 2

## 2023-01-06 MED ORDER — ONDANSETRON HCL 4 MG PO TABS: 4.0000 mg | ORAL_TABLET | Freq: Four times a day (QID) | ORAL | Status: DC | PRN

## 2023-01-06 MED ORDER — GLYCOPYRROLATE 0.2 MG/ML IJ SOLN
0.2000 mg | INTRAMUSCULAR | Status: DC | PRN
  Administered 2023-01-06: 0.2 mg via INTRAVENOUS

## 2023-01-06 MED ORDER — MORPHINE SULFATE (PF) 2 MG/ML IV SOLN
2.0000 mg | INTRAVENOUS | Status: DC | PRN
  Administered 2023-01-06: 4 mg via INTRAVENOUS
  Administered 2023-01-06: 2 mg via INTRAVENOUS
  Administered 2023-01-06 (×2): 4 mg via INTRAVENOUS
  Filled 2023-01-06 (×3): qty 2
  Filled 2023-01-06: qty 1

## 2023-01-06 MED ORDER — OXYCODONE HCL 5 MG PO TABS: 2.5000 mg | ORAL_TABLET | ORAL | Status: DC | PRN

## 2023-01-06 MED ORDER — SODIUM CHLORIDE 0.9 % IV SOLN
1.0000 mg/h | INTRAVENOUS | Status: DC
  Filled 2023-01-06: qty 10

## 2023-01-06 MED ORDER — LOSARTAN POTASSIUM 50 MG PO TABS: 25.0000 mg | ORAL_TABLET | Freq: Every day | ORAL | Status: DC

## 2023-01-06 MED ORDER — SODIUM CHLORIDE 0.9 % IV SOLN: 1.0000 g | INTRAVENOUS | Status: DC

## 2023-01-06 MED ORDER — MORPHINE SULFATE (PF) 4 MG/ML IV SOLN: 6.0000 mg | Freq: Once | INTRAVENOUS | Status: DC

## 2023-01-06 MED ORDER — SODIUM CHLORIDE 0.9% FLUSH
INTRAVENOUS | Status: DC | PRN
  Administered 2023-01-06: 10 mL

## 2023-01-06 MED ORDER — GABAPENTIN 100 MG PO CAPS: 100.0000 mg | ORAL_CAPSULE | Freq: Two times a day (BID) | ORAL | Status: DC

## 2023-01-06 MED ORDER — SERTRALINE HCL 100 MG PO TABS: 50.0000 mg | ORAL_TABLET | Freq: Every day | ORAL | Status: DC

## 2023-01-06 MED ORDER — FENTANYL CITRATE PF 50 MCG/ML IJ SOSY
25.0000 ug | PREFILLED_SYRINGE | INTRAMUSCULAR | Status: DC | PRN
  Administered 2023-01-06: 100 ug via INTRAVENOUS
  Administered 2023-01-06: 50 ug via INTRAVENOUS
  Administered 2023-01-06: 100 ug via INTRAVENOUS
  Filled 2023-01-06: qty 2
  Filled 2023-01-06: qty 1
  Filled 2023-01-06: qty 2

## 2023-01-06 MED FILL — Medication: Qty: 1 | Status: AC

## 2023-01-06 NOTE — Consult Note (Signed)
NAME:  Lisa Vega, MRN:  [id number], DOB:  [birth year], LOS: 0 ADMISSION DATE:  01/25/2023, CONSULTATION DATE:  01/06/22 REFERRING MD:  code team, CHIEF COMPLAINT:  cardiac arrest   History of Present Illness:  HPI obtained from EMR as patient encephalopathic and intubated.   49 yoF extensive hx as below who presented today after a fall at the GYN office.  Was being seen for uterine prolapse and unfortunately suffered a fall.  Found to have left femoral neck fracture and being treated for UTI, admitted to [facility].  Went for a nerve block with anesthesia for planned surgery tomorrow with orthopedics.  Unfortunately, shortly after nerve block, patient became agonal and unresponsive.  CODE called and ACLS measures initiated.  Given reversal with lipids and intubated by anesthesia at bedside.  Unclear initial rhythm.  Was able to sustain ROSC after 25 mins maxed on Epi gtt.  PCCM called for ICU admit.  Unfortunately pt suffered two more brief PEA arrests.  Daughter present at bedside and CODE status was changed to continue current meds, but no further CPR/ defib.    Pertinent  Medical History   Past Medical History:  Diagnosis Date   Anxiety    Arthritis    RA   BPV (benign positional vertigo) 03/10/2018   Cancer ([place])    lung ca   Carotid stenosis, left 09/19/2018   COPD (chronic obstructive pulmonary disease) (HCC)    Depression    Dyspnea    with exertion   GERD (gastroesophageal reflux disease)    Headache syndrome 03/09/2019   Hypercholesteremia    Hypothyroidism    Memory difficulties 10/28/2015   Pneumonia     x3  last time 2017   Pre-diabetes    Rheumatoid arthritis(714.0)    Thyroid disease    Tremor 10/28/2015   Jaw tremor   Uterine prolapse    Vitamin D deficiency    Significant Hospital Events: Including procedures, antibiotic start and stop dates in addition to other pertinent events     Interim History / Subjective:   Objective   Blood pressure (!) 166/124, pulse  (!) 107, temperature 98 F (36.7 C), resp. rate 12, SpO2 (!) 72 %.    Vent Mode: PRVC FiO2 (%):  [100 %] 100 % Set Rate:  [15 bmp] 15 bmp Vt Set:  [420 mL] 420 mL PEEP:  [5 cmH20] 5 cmH20 Plateau Pressure:  [7 cmH20] 7 cmH20  No intake or output data in the 24 hours ending 01/17/2023 1852 There were no vitals filed for this visit.  Examination: General:  critically ill frail elderly female unresponsive in bed  HEENT:  pupils fixed/ dilated, ETT Neuro:  unresponsive CV: rr, ir, weak peripheral pulse PULM:  BVM breaths, present bilateral breath sounds Extremities: cool, no LE edema  Skin: mottled   Resolved Hospital Problem list    Assessment & Plan:   Cardiac arrest- felt secondary to systemic reaction 2/2 nerve block  Left femoral neck hip fracture UTI Hypertensive urgency RA Osteoporosis Plan - s/p lipids for reversal during initial code  - patient is actively dying despite max vasopressor support.  Daughter at bedside, given prolonged arrest and continued instability, age, and multiple comorbidities, code status changed to DNR/ DNI.  No further plans for invasive or aggressive care.  Family does not wish to prolong her suffering. Plans to transition to comfort care with one-way compassionate extubation after chaplin has prayed with patient and family is ready to transition.  PRN fentanyl,  ativan, robinul ordered per protocol.  Wean epi off after extubation.  Ongoing emotional support provided.     Labs   CBC: Recent Labs  Lab 01/08/2023 1420  WBC 10.0  NEUTROABS 6.5  HGB 12.6  HCT 39.2  MCV 95.8  PLT 115*    Basic Metabolic Panel: Recent Labs  Lab 12/30/2022 1420  NA 136  K 3.9  CL 101  CO2 27  GLUCOSE 111*  BUN 13  CREATININE 0.89  CALCIUM 8.7*   GFR: Estimated Creatinine Clearance: 36.1 mL/min (by C-G formula based on SCr of 0.89 mg/dL). Recent Labs  Lab 01/27/2023 1420  WBC 10.0    Liver Function Tests: Recent Labs  Lab 01/13/2023 1420  AST 54*   ALT 32  ALKPHOS 118  BILITOT 0.7  PROT 6.8  ALBUMIN 3.2*   No results for input(s): "LIPASE", "AMYLASE" in the last 168 hours. No results for input(s): "AMMONIA" in the last 168 hours.  ABG    Component Value Date/Time   PHART 7.405 10/06/2018 1224   PCO2ART 42.4 10/06/2018 1224   PO2ART 75.6 (L) 10/06/2018 1224   HCO3 26.0 10/06/2018 1224   O2SAT 94.6 10/06/2018 1224     Coagulation Profile: No results for input(s): "INR", "PROTIME" in the last 168 hours.  Cardiac Enzymes: No results for input(s): "CKTOTAL", "CKMB", "CKMBINDEX", "TROPONINI" in the last 168 hours.  HbA1C: Hemoglobin A1C  Date/Time Value Ref Range Status  06/11/2022 02:06 PM 5.6 4.0 - 5.6 % Final   HbA1c, POC (prediabetic range)  Date/Time Value Ref Range Status  06/11/2022 02:06 PM 5.6 (A) 5.7 - 6.4 % Final   HbA1c, POC (controlled diabetic range)  Date/Time Value Ref Range Status  06/11/2022 02:06 PM 5.6 0.0 - 7.0 % Final   HbA1c POC (<> result, manual entry)  Date/Time Value Ref Range Status  06/11/2022 02:06 PM 5.6 4.0 - 5.6 % Final   Hgb A1c MFr Bld  Date/Time Value Ref Range Status  12/11/2022 02:53 PM 6.9 (H) 4.6 - 6.5 % Final    Comment:    Glycemic Control Guidelines for People with Diabetes:Non Diabetic:  <6%Goal of Therapy: <7%Additional Action Suggested:  >8%   12/02/2021 11:53 AM 5.9 4.6 - 6.5 % Final    Comment:    Glycemic Control Guidelines for People with Diabetes:Non Diabetic:  <6%Goal of Therapy: <7%Additional Action Suggested:  >8%     CBG: No results for input(s): "GLUCAP" in the last 168 hours.  Review of Systems:   Unable   Past Medical History:  She,  has a past medical history of Anxiety, Arthritis, BPV (benign positional vertigo) (03/10/2018), Cancer ([place]), Carotid stenosis, left (09/19/2018), COPD (chronic obstructive pulmonary disease) ([place]), Depression, Dyspnea, GERD (gastroesophageal reflux disease), Headache syndrome (03/09/2019), Hypercholesteremia,  Hypothyroidism, Memory difficulties (10/28/2015), Pneumonia, Pre-diabetes, Rheumatoid arthritis(714.0), Thyroid disease, Tremor (10/28/2015), Uterine prolapse, and Vitamin D deficiency.   Surgical History:   Past Surgical History:  Procedure Laterality Date   ANGIOPLASTY Left 10/10/2018   Procedure: ANGIOPLASTY using 1cm x 14cm Xenosure Patch;  Surgeon: Marty Heck, MD;  Location: [place];  Service: Vascular;  Laterality: Left;   APPENDECTOMY     COLONOSCOPY W/ POLYPECTOMY     ENDARTERECTOMY Left 10/10/2018   Procedure: ENDARTERECTOMY CAROTID;  Surgeon: Marty Heck, MD;  [facility];  Service: Vascular;  Laterality: Left;   EYE SURGERY Bilateral    cataract   FOOT SURGERY Bilateral    arthritis   Hands  Bilateral  Arthritis   HEMORRHOID SURGERY     right lobectomy  10/2004   TONSILLECTOMY AND ADENOIDECTOMY       Social History:   reports that she quit smoking about 18 years ago. Her smoking use included cigarettes. She has a 20.00 pack-year smoking history. She has been exposed to tobacco smoke. She has never used smokeless tobacco. She reports current alcohol use of about 2.0 standard drinks of alcohol per week. She reports that she does not use drugs.   Family History:  Her family history includes Asthma in her mother; Bone cancer in her father; Diabetes in her brother, brother, sister, and sister; Emphysema in her mother; Heart disease in her brother and sister; Hypertension in her brother, brother, sister, sister, and son; Lung cancer in her father; Lupus in her brother; Pancreatic cancer in her father; Stroke in her maternal grandfather; Tremor in her son.   Allergies Allergies  Allergen Reactions   Codeine Other (See Comments)    Hallucinations   Meperidine Hcl Nausea And Vomiting    Severe GI upset   Demerol Other (See Comments)   Escitalopram Other (See Comments)    Other reaction(s): memory issues   Meperidine Hcl Other (See Comments)   Other  Other (See Comments)   Sulfa Antibiotics Rash     Home Medications  Prior to Admission medications   Medication Sig Start Date End Date Taking? Authorizing Provider  albuterol (PROAIR HFA) 108 (90 Base) MCG/ACT inhaler Inhale 2 puffs into the lungs every 6 (six) hours as needed for wheezing or shortness of breath. 03/25/22  Yes Martyn Ehrich, NP  ALPRAZolam (XANAX) 0.25 MG tablet TAKE 1 TABLET BY MOUTH EVERY DAY AS NEEDED FOR ANXIETY Patient taking differently: Take 0.25 mg by mouth daily as needed for anxiety. 06/10/22  Yes Burns, [name], MD  aspirin EC 81 MG tablet Take 81 mg by mouth at bedtime.   Yes [provider]  BREYNA 160-4.5 MCG/ACT inhaler Inhale 2 puffs into the lungs 2 (two) times daily.   Yes [provider]  clidinium-chlordiazePOXIDE (LIBRAX) 5-2.5 MG capsule Take 1 capsule by mouth in the morning. 04/22/20  Yes [provider]  famotidine (PEPCID) 40 MG tablet TAKE 1 TABLET DAILY Patient taking differently: Take 40 mg by mouth daily as needed for heartburn or indigestion. 03/14/21  Yes Burns, [name], MD  gabapentin (NEURONTIN) 100 MG capsule Take 1 capsule (100 mg total) by mouth 3 (three) times daily. Patient taking differently: Take 100 mg by mouth 2 (two) times daily. 10/21/22  Yes Suzzanne Cloud, NP  ibuprofen (ADVIL,MOTRIN) 200 MG tablet Take 200-400 mg by mouth every 6 (six) hours as needed for headache or mild pain.   Yes [provider]  losartan (COZAAR) 25 MG tablet Take 1 tablet (25 mg total) by mouth daily. 04/17/22 04/12/23 Yes Donato Heinz, MD  meclizine (ANTIVERT) 25 MG tablet Take 0.5-1 tablets (12.5-25 mg total) by mouth 3 (three) times daily as needed for dizziness. 01/03/21  Yes Burns, [name], MD  Methotrexate Sodium (METHOTREXATE, PF,) 50 MG/2ML injection INJECT 0.7 MLS (17.5 MG TOTAL) INTO THE SKIN ONCE A WEEK. Patient taking differently: Inject 17.5 mg into the muscle every Tuesday. 11/02/22  Yes Rice,  Resa Miner, MD  metroNIDAZOLE (METROGEL) 0.75 % gel Apply 1 Application topically See admin instructions. APPLY TO AFFECTED AREA OF FACE UP TO 2 TIMES DAILY AS NEEDED/AS DIRECTED   Yes [provider]  MURO 128 5 % ophthalmic  ointment Place 1 Application into both eyes at bedtime.   Yes [provider]  omeprazole (PRILOSEC) 40 MG capsule Take 40 mg by mouth daily before breakfast.   Yes [provider]  Plecanatide (TRULANCE) 3 MG TABS Take 3 mg by mouth daily as needed (for constipation). 08/29/20  Yes [provider]  rosuvastatin (CRESTOR) 5 MG tablet Take 1 tablet (5 mg total) by mouth daily. 03/10/22 01/03/2023 Yes Donato Heinz, MD  sertraline (ZOLOFT) 100 MG tablet TAKE 1 TABLET BY MOUTH EVERY DAY Patient taking differently: Take 50 mg by mouth in the morning. TAKE 1 TABLET BY MOUTH EVERY DAY 07/06/22  Yes Burns, [name], MD  Sodium Chloride, Hypertonic, (MURO 128 OP) Apply 1 drop to eye See admin instructions. Instill 1 drop into both eyes three to four times a day as needed for dryness   Yes [provider]  SPIRIVA RESPIMAT 2.5 MCG/ACT AERS USE 2 INHALATIONS ORALLY   DAILY Patient taking differently: Inhale 1 puff into the lungs 2 (two) times a week. 09/02/21  Yes Olalere, Adewale A, MD  SYNTHROID 88 MCG tablet Take 88 mcg by mouth daily before breakfast.   Yes [provider]  traMADol (ULTRAM) 50 MG tablet Take 50 mg by mouth 3 (three) times daily as needed (for pain). 01/12/22  Yes [provider]  triamcinolone ointment (KENALOG) 0.1 % Apply 1 Application topically 2 (two) times daily as needed. Patient taking differently: Apply 1 Application topically 2 (two) times daily as needed (for itching). 08/19/22  Yes Chrzanowski, Jami B, NP  TYLENOL 500 MG tablet Take 500-1,000 mg by mouth every 8 (eight) hours as needed for headache or mild pain.   Yes [provider]  B-D TB SYRINGE 1CC/27GX1/2" 27G X 1/2" 1 ML MISC  For use with methotrexate injection weekly 08/26/21   Collier Salina, MD  budesonide-formoterol [facility]) 160-4.5 MCG/ACT inhaler Inhale 2 puffs into the lungs 2 (two) times daily. Patient not taking: Reported on 12/28/2022 01/04/23   Laurin Coder, MD  estradiol (ESTRACE) 0.1 MG/GM vaginal cream Place 1 g vaginally 3 (three) times a week. Patient not taking: Reported on 12/30/2022 08/19/22   Chrzanowski, Wende Crease B, NP  folic acid (FOLVITE) 1 MG tablet Take 1 tablet (1 mg total) by mouth daily. Patient not taking: Reported on 01/20/2023 10/21/21   Collier Salina, MD  levothyroxine (SYNTHROID) 88 MCG tablet Take 1 tablet (88 mcg total) by mouth daily. Patient not taking: Reported on 01/09/2023 08/12/22   Binnie Rail, MD  mupirocin ointment (BACTROBAN) 2 % Place 1 application into the nose 2 (two) times daily. Patient not taking: Reported on 01/26/2023 01/17/19   Fontaine, Belinda Block, MD  [facility] DELICA LANCETS 11A MISC USE TO CHECK SUGARS UP TO 4 TIMES A DAY 03/19/18   [provider]  [facility] ULTRA test strip USE TO CHECK BLOOD SUGARS TWICE A DAY 04/08/22   Binnie Rail, MD     Critical care time: [address] mins       Amanda Cockayne [facility] 01/27/2023, 6:52 PM  See Amion for pager If no response to pager, please call PCCM consult pager After 7:00 pm call Elink

## 2023-01-06 NOTE — Progress Notes (Signed)
[facility] Progress Note Patient Name: Lisa Vega DOB: 28-May-1932 MRN: [id number]   Date of Service  01/17/2023  HPI/Events of Note  Patient is on comfort measures and not comfortable getting Morphine 4 mg Q 30 minutes PRN.   eICU Interventions  Plan: Morphine 6 mg IV now. Morphine IV infusion 1-15 mg/hour. Titrate to comfort (relief of pain and dyspnea)     Intervention Category Major Interventions: Other:  Lysle Dingwall 01/27/2023, 11:44 PM

## 2023-01-06 NOTE — H&P (Signed)
History and Physical    Patient: Lisa Vega DOB: 03/29/1932 DOA: 12/31/2022 DOS: the patient was seen and examined on 12/31/2022 PCP: Binnie Rail, MD  Patient coming from: Home  Chief Complaint:  Chief Complaint  Patient presents with   Hip Pain   HPI: Lisa Vega is a 89 y.o. female with medical history significant of anxiety, depression, rheumatoid arthritis, benign positional vertigo, lung cancer, left carotid stenosis, COPD, GERD, headache syndrome, hyperlipidemia, hypothyroidism, memory difficulties, history of pneumonia x 3, prediabetes, jaw tremor, uterine prolapse with pessary placement, vitamin D deficiency who was following with GYN today and had a fall at the clinic while she was putting back on her underwear.  She denied any prodromal symptoms.   No chest pain, diaphoresis, PND, orthopnea or pitting edema of the lower extremities.  She gets occasional self-limited palpitations.  He denied fever, chills, rhinorrhea, sore throat, wheezing or hemoptysis. No abdominal pain, nausea, emesis, diarrhea, constipation, melena or hematochezia.  She has been having dysuria and frequency.  No flank pain or hematuria.  No polyuria, polydipsia, polyphagia or blurred vision.   ED course: Initial vital signs were temperature 97.7 F, pulse 60, respirations 16, BP 122/86 mmHg and O2 sat 97% on room air.  The patient received fentanyl 50 mcg IVP x 1.  Lab work: Her CBC showed a white count 10.0, hemoglobin 12.6 g/dL platelets 115.  CMP showed normal electrolytes and renal function after calcium correction.  Glucose 111 mg/dL, albumin 3.2 g/dL and AST 54 units/L. Urinalysis done earlier today with 3+ leukocyte esterase, many bacteria and more than 60 WBC on microscopic examination.  Urine culture was collected at the GYN clinic.  Imaging: Left hip x-ray shows acute mildly displaced fracture through the left femoral neck.  Left elbow x-ray with no fracture or dislocation.   Portable 1 view chest radiograph with interval decrease in pulmonary edema.  Unchanged asymmetric elevation of the right hemidiaphragm.   Review of Systems: As mentioned in the history of present illness. All other systems reviewed and are negative. Past Medical History:  Diagnosis Date   Anxiety    Arthritis    RA   BPV (benign positional vertigo) 03/10/2018   Cancer ([place])    lung ca   Carotid stenosis, left 09/19/2018   COPD (chronic obstructive pulmonary disease) (HCC)    Depression    Dyspnea    with exertion   GERD (gastroesophageal reflux disease)    Headache syndrome 03/09/2019   Hypercholesteremia    Hypothyroidism    Memory difficulties 10/28/2015   Pneumonia     x3  last time 2017   Pre-diabetes    Rheumatoid arthritis(714.0)    Thyroid disease    Tremor 10/28/2015   Jaw tremor   Uterine prolapse    Vitamin D deficiency    Past Surgical History:  Procedure Laterality Date   ANGIOPLASTY Left 10/10/2018   Procedure: ANGIOPLASTY using 1cm x 14cm Xenosure Patch;  Surgeon: Marty Heck, MD;  Location: [facility] OR;  Service: Vascular;  Laterality: Left;   APPENDECTOMY     COLONOSCOPY W/ POLYPECTOMY     ENDARTERECTOMY Left 10/10/2018   Procedure: ENDARTERECTOMY CAROTID;  Surgeon: Marty Heck, MD;  Location: [facility] OR;  Service: Vascular;  Laterality: Left;   EYE SURGERY Bilateral    cataract   FOOT SURGERY Bilateral    arthritis   Hands  Bilateral    Arthritis   HEMORRHOID SURGERY     right lobectomy  10/2004   TONSILLECTOMY AND ADENOIDECTOMY     Social History:  reports that she quit smoking about 18 years ago. Her smoking use included cigarettes. She has a 20.00 pack-year smoking history. She has been exposed to tobacco smoke. She has never used smokeless tobacco. She reports current alcohol use of about 2.0 standard drinks of alcohol per week. She reports that she does not use drugs.  Allergies  Allergen Reactions   Codeine Other (See Comments)     Hallucinations   Meperidine Hcl Nausea And Vomiting    Severe GI upset   Demerol Other (See Comments)   Escitalopram Other (See Comments)    Other reaction(s): memory issues   Meperidine Hcl Other (See Comments)   Other Other (See Comments)   Sulfa Antibiotics Rash    Family History  Problem Relation Age of Onset   Emphysema Mother    Asthma Mother    Lung cancer Father    Pancreatic cancer Father    Bone cancer Father    Heart disease Brother        x1   Diabetes Brother    Hypertension Brother    Lupus Brother    Heart disease Sister        x2   Diabetes Sister    Hypertension Sister    Diabetes Sister    Hypertension Sister    Diabetes Brother    Hypertension Brother    Stroke Maternal Grandfather    Hypertension Son    Tremor Son     Prior to Admission medications   Medication Sig Start Date End Date Taking? Authorizing Provider  albuterol (PROAIR HFA) 108 (90 Base) MCG/ACT inhaler Inhale 2 puffs into the lungs every 6 (six) hours as needed for wheezing or shortness of breath. 03/25/22  Yes Martyn Ehrich, NP  ALPRAZolam (XANAX) 0.25 MG tablet TAKE 1 TABLET BY MOUTH EVERY DAY AS NEEDED FOR ANXIETY Patient taking differently: Take 0.25 mg by mouth daily as needed for anxiety. 06/10/22  Yes Burns, [name], MD  aspirin EC 81 MG tablet Take 81 mg by mouth at bedtime.   Yes [provider]  budesonide-formoterol (SYMBICORT) 160-4.5 MCG/ACT inhaler Inhale 2 puffs into the lungs 2 (two) times daily. 01/04/23  Yes Olalere, Adewale A, MD  gabapentin (NEURONTIN) 100 MG capsule Take 1 capsule (100 mg total) by mouth 3 (three) times daily. Patient taking differently: Take 100 mg by mouth 2 (two) times daily. 10/21/22  Yes Suzzanne Cloud, NP  ibuprofen (ADVIL,MOTRIN) 200 MG tablet Take 200-400 mg by mouth every 6 (six) hours as needed for headache or mild pain.   Yes [provider]  losartan (COZAAR) 25 MG tablet Take 1 tablet (25 mg total) by mouth daily.  04/17/22 04/12/23 Yes Donato Heinz, MD  meclizine (ANTIVERT) 25 MG tablet Take 0.5-1 tablets (12.5-25 mg total) by mouth 3 (three) times daily as needed for dizziness. 01/03/21  Yes Burns, [name], MD  Methotrexate Sodium (METHOTREXATE, PF,) 50 MG/2ML injection INJECT 0.7 MLS (17.5 MG TOTAL) INTO THE SKIN ONCE A WEEK. 11/02/22  Yes Rice, Resa Miner, MD  omeprazole (PRILOSEC) 40 MG capsule Take 40 mg by mouth daily before breakfast.   Yes [provider]  rosuvastatin (CRESTOR) 5 MG tablet Take 1 tablet (5 mg total) by mouth daily. 03/10/22 01/26/2023 Yes Donato Heinz, MD  traMADol (ULTRAM) 50 MG tablet Take 50 mg by mouth 3 (three) times daily as needed (for pain). 01/12/22  Yes  [provider]  B-D TB SYRINGE 1CC/27GX1/2" 27G X 1/2" 1 ML MISC For use with methotrexate injection weekly 08/26/21   Collier Salina, MD  clidinium-chlordiazePOXIDE (LIBRAX) 5-2.5 MG capsule SMARTSIG:1 Capsule(s) By Mouth Every 12 Hours PRN 04/22/20   [provider]  estradiol (ESTRACE) 0.1 MG/GM vaginal cream Place 1 g vaginally 3 (three) times a week. Patient not taking: Reported on 12/29/2022 08/19/22   [name] B, NP  famotidine (PEPCID) 40 MG tablet TAKE 1 TABLET DAILY 03/14/21   Binnie Rail, MD  folic acid (FOLVITE) 1 MG tablet Take 1 tablet (1 mg total) by mouth daily. 10/21/21   Collier Salina, MD  levothyroxine (SYNTHROID) 88 MCG tablet Take 1 tablet (88 mcg total) by mouth daily. Patient not taking: Reported on 01/09/2023 08/12/22   Binnie Rail, MD  mupirocin ointment (BACTROBAN) 2 % Place 1 application into the nose 2 (two) times daily. Patient not taking: Reported on 01/14/2023 01/17/19   Fontaine, Belinda Block, MD  [facility] DELICA LANCETS 77O MISC USE TO CHECK SUGARS UP TO 4 TIMES A DAY 03/19/18   [provider]  ONETOUCH ULTRA test strip USE TO CHECK BLOOD SUGARS TWICE A DAY 04/08/22   Burns, [name], MD  Plecanatide (TRULANCE) 3 MG TABS Take 3  mg by mouth daily as needed (for). 08/29/20   [provider]  sertraline (ZOLOFT) 100 MG tablet TAKE 1 TABLET BY MOUTH EVERY DAY 07/06/22   Burns, [name], MD  SPIRIVA RESPIMAT 2.5 MCG/ACT AERS USE 2 INHALATIONS ORALLY   DAILY Patient taking differently: Inhale 2 puffs into the lungs daily as needed. 09/02/21   Olalere, Cicero Duck A, MD  triamcinolone ointment (KENALOG) 0.1 % Apply 1 Application topically 2 (two) times daily as needed. 08/19/22   Kerry Dory, NP    Physical Exam: Vitals:   01/20/2023 1235 01/02/2023 1300  BP: 122/86 (!) 142/86  Pulse: 60 (!) 59  Resp: 16 20  Temp: 97.7 F (36.5 C)   TempSrc: Oral   SpO2: 97% 92%   Physical Exam Vitals and nursing note reviewed.  Constitutional:      General: She is awake. She is not in acute distress.    Appearance: Normal appearance.  HENT:     Head: Normocephalic.     Nose: No rhinorrhea.     Mouth/Throat:     Mouth: Mucous membranes are dry.  Eyes:     General: No scleral icterus.    Pupils: Pupils are equal, round, and reactive to light.  Neck:     Vascular: No JVD.  Cardiovascular:     Rate and Rhythm: Normal rate and regular rhythm.     Heart sounds: S1 normal and S2 normal.  Pulmonary:     Effort: Pulmonary effort is normal.     Breath sounds: Normal breath sounds. No wheezing, rhonchi or rales.  Abdominal:     General: Bowel sounds are normal.     Palpations: Abdomen is soft.     Tenderness: There is abdominal tenderness.  Musculoskeletal:     Cervical back: Neck supple.     Left hip: Tenderness present. Decreased range of motion.     Right lower leg: No edema.     Left lower leg: No edema.     Comments: Shortened and laterally rotated LLE.  Skin:    General: Skin is warm and dry.  Neurological:     General: No focal deficit present.     Mental  Status: She is alert and oriented to person, place, and time.  Psychiatric:        Mood and Affect: Mood normal.        Behavior: Behavior normal. Behavior  is cooperative.     Data Reviewed:  Results are pending, will review when available.  Assessment and Plan: Principal Problem:   Closed left hip fracture, initial encounter ([place]) Consequences of a fall in the setting of:   Osteoporosis Admit to telemetry/inpatient. Ice area as needed. Buck's traction per protocol. Analgesics as needed. Antiemetics as needed. Consult TOC team. Consult nutritional services. PT evaluation after surgery. Orthopedic surgery evaluation appreciated. Surgery planned for tomorrow morning. -Patient to discuss osteoporosis treatment, Calcium plus vitamin D supplement With PCP and/or rheumatologist.  Active Problems:   Hypertensive urgency Continue losartan 25 mg p.o. daily. Hydralazine 10 mg q4 h PRN. Optimize pain control. Currently getting left-sided nerve block. Monitor blood pressure closely.    Acute UTI (urinary tract infection) Begin ceftriaxone 1 g IVPB daily. Follow-up urine culture and sensitivity.    Rheumatoid arthritis (HCC) Hold methotrexate for now. Analgesics as needed.    COPD (chronic obstructive pulmonary disease) (HCC) Continue twice a week Tropium bromide. Dulera 2 puffs twice daily while in the hospital. Albuterol MDI as needed. Supplemental oxygen as needed.    Depression Continue sertraline 50 mg p.o. daily.    Hypothyroidism Continue Synthroid 88 mcg p.o. daily.    GERD (gastroesophageal reflux disease) Pantoprazole 40 mg p.o. daily.    Mixed hyperlipidemia Continue rosuvastatin 5 mg p.o. daily.    Advance Care Planning:   Code Status: Full Code   Consults: Orthopedic surgery Marchia Bond, D).  Family Communication:   Severity of Illness: The appropriate patient status for this patient is INPATIENT. Inpatient status is judged to be reasonable and necessary in order to provide the required intensity of service to ensure the patient's safety. The patient's presenting symptoms, physical exam findings,  and initial radiographic and laboratory data in the context of their chronic comorbidities is felt to place them at high risk for further clinical deterioration. Furthermore, it is not anticipated that the patient will be medically stable for discharge from the hospital within 2 midnights of admission.   * I certify that at the point of admission it is my clinical judgment that the patient will require inpatient hospital care spanning beyond 2 midnights from the point of admission due to high intensity of service, high risk for further deterioration and high frequency of surveillance required.*  Author: Reubin Milan, MD 01/15/2023 2:35 PM  For on call review [email or web address].

## 2023-01-06 NOTE — Progress Notes (Signed)
Contacted by patient's RN to inform me that the patient's family was ready for the patient to be extubated.  Patient extubated to 2Lpm nasal cannula.  Nurse at bedside to provide medication for patient comfort.  Family returned to patient bedside.

## 2023-01-06 NOTE — Progress Notes (Addendum)
   Acute Office Visit  Subjective:    Patient ID: Lisa Vega, female    DOB: [birth year], 89 y.o.   MRN: [id number]   HPI 89 y.o. presents today for pessary maintenance. #8 shaatz made from modified Gelhorn in place. Removed and reinserted 11/26/2022. Usually has cleaned every 3 months. Did not realize it has only been 6 weeks. Is not very happy with it but does not want to refit again. She feels it affects defecation and that sometimes she has lower abdominal pain. Complains or urinary odor and pelvic pressure x 3 months. Treated for UTI 11/26/22, unsure if symptoms improved. Using vaginal estrogen 3 times weekly. Reports she has seen Urogynecology in the past and was unhappy with care. Unsure if she wants to do this again.    Review of Systems  Constitutional: Negative.   Genitourinary:  Positive for frequency and pelvic pain (Pressure). Negative for difficulty urinating, dysuria, hematuria, urgency, vaginal bleeding, vaginal discharge and vaginal pain.  Musculoskeletal:  Positive for gait problem.       Left hip pain       Objective:    Physical Exam Constitutional:      Appearance: Normal appearance.  Genitourinary:    General: Normal vulva.     Vagina: Normal.     Uterus: With uterine prolapse.      Comments: Cystocele and uterine prolapse present Musculoskeletal:     Right hip: Normal.     Left hip: No lacerations or bony tenderness. Decreased range of motion.  Skin:         BP 124/80 (BP Location: Right Arm, Patient Position: Sitting, Cuff Size: Normal)   Pulse 63   Wt 120 lb (54.4 kg)   SpO2 92%   BMI 21.26 kg/m  Wt Readings from Last 3 Encounters:  01/05/2023 120 lb (54.4 kg)  12/11/22 121 lb (54.9 kg)  12/02/22 123 lb (55.8 kg)       Assessment & Plan:   Problem List Items Addressed This Visit   None Visit Diagnoses     Cystocele with uterine prolapse    -  Primary   Genitourinary syndrome of menopause       Pessary maintenance       Fall,  initial encounter       Acute cystitis with hematuria       Pelvic pressure in female       Relevant Orders   Urinalysis,Complete w/RFL Culture       Plan: Agree to keep pessary in place until 3 month interval. Acute cystitis. EMS aware will inform ER personnel.  While getting dressed patient reached around corner of changing room door to throw paper cover away and lost her balance. She fell on left hip and elbow. Patient evaluated immediately and displays pain in left hip, unable to straighten leg without pain. Small left knee skin tear. Mild pain in elbow. Assistance with dressing offered but declined prior to event. H/O right hip fracture 2 years ago. Evaluation at ER recommended. EMS transported patient to local ER. Daughter aware and present for transfer. VSS at transport. Pain minimal.       Tamela Gammon DNP, 3:09 PM 12/29/2022

## 2023-01-06 NOTE — Consult Note (Signed)
Reason for Consult:Left hip fx Referring Physician: Tennis Must Time called: 3846 Time at bedside: Lisa Vega is an 89 y.o. female.  HPI: Lisa Vega was at her OB appt and fell. She's not exactly sure what happened but didn't hit her head. She had immediate pain in her left elbow and hip. She was brought to the ED where x-rays showed a left hip fx and orthopedic surgery was consulted. She lives with her daughter and generally does not use an assistive device to ambulate.  Past Medical History:  Diagnosis Date   Anxiety    Arthritis    RA   BPV (benign positional vertigo) 03/10/2018   Cancer ([place])    lung ca   Carotid stenosis, left 09/19/2018   COPD (chronic obstructive pulmonary disease) (HCC)    Depression    Dyspnea    with exertion   GERD (gastroesophageal reflux disease)    Headache syndrome 03/09/2019   Hypercholesteremia    Hypothyroidism    Memory difficulties 10/28/2015   Pneumonia     x3  last time 2017   Pre-diabetes    Rheumatoid arthritis(714.0)    Thyroid disease    Tremor 10/28/2015   Jaw tremor   Uterine prolapse    Vitamin D deficiency     Past Surgical History:  Procedure Laterality Date   ANGIOPLASTY Left 10/10/2018   Procedure: ANGIOPLASTY using 1cm x 14cm Xenosure Patch;  Surgeon: Marty Heck, MD;  Location: [facility] OR;  Service: Vascular;  Laterality: Left;   APPENDECTOMY     COLONOSCOPY W/ POLYPECTOMY     ENDARTERECTOMY Left 10/10/2018   Procedure: ENDARTERECTOMY CAROTID;  Surgeon: Marty Heck, MD;  Location: [facility] OR;  Service: Vascular;  Laterality: Left;   EYE SURGERY Bilateral    cataract   FOOT SURGERY Bilateral    arthritis   Hands  Bilateral    Arthritis   HEMORRHOID SURGERY     right lobectomy  10/2004   TONSILLECTOMY AND ADENOIDECTOMY      Family History  Problem Relation Age of Onset   Emphysema Mother    Asthma Mother    Lung cancer Father    Pancreatic cancer Father    Bone cancer Father    Heart disease  Brother        x1   Diabetes Brother    Hypertension Brother    Lupus Brother    Heart disease Sister        x2   Diabetes Sister    Hypertension Sister    Diabetes Sister    Hypertension Sister    Diabetes Brother    Hypertension Brother    Stroke Maternal Grandfather    Hypertension Son    Tremor Son     Social History:  reports that she quit smoking about 18 years ago. Her smoking use included cigarettes. She has a 20.00 pack-year smoking history. She has been exposed to tobacco smoke. She has never used smokeless tobacco. She reports current alcohol use of about 2.0 standard drinks of alcohol per week. She reports that she does not use drugs.  Allergies:  Allergies  Allergen Reactions   Codeine Other (See Comments)    Hallucinations   Meperidine Hcl Nausea And Vomiting    Severe GI upset   Demerol Other (See Comments)   Escitalopram Other (See Comments)    Other reaction(s): memory issues   Meperidine Hcl Other (See Comments)   Other Other (See Comments)   Sulfa  Antibiotics Rash    Medications: I have reviewed the patient's current medications.  Results for orders placed or performed during the hospital encounter of 01/24/2023 (from the past 48 hour(s))  Comprehensive metabolic panel     Status: Abnormal   Collection Time: 01/05/2023  2:20 PM  Result Value Ref Range   Sodium 136 135 - 145 mmol/L   Potassium 3.9 3.5 - 5.1 mmol/L   Chloride 101 98 - 111 mmol/L   CO2 27 22 - 32 mmol/L   Glucose, Bld 111 (H) 70 - 99 mg/dL    Comment: Glucose reference range applies only to samples taken after fasting for at least 8 hours.   BUN 13 8 - 23 mg/dL   Creatinine, Ser 0.89 0.44 - 1.00 mg/dL   Calcium 8.7 (L) 8.9 - 10.3 mg/dL   Total Protein 6.8 6.5 - 8.1 g/dL   Albumin 3.2 (L) 3.5 - 5.0 g/dL   AST 54 (H) 15 - 41 U/L   ALT 32 0 - 44 U/L   Alkaline Phosphatase 118 38 - 126 U/L   Total Bilirubin 0.7 0.3 - 1.2 mg/dL   GFR, Estimated >60 >60 mL/min    Comment:  (NOTE) Calculated using the CKD-EPI Creatinine Equation (2021)    Anion gap 8 5 - 15    Comment: Performed at [facility], [place] [address] [place], [place] [address]  CBC with Differential     Status: Abnormal   Collection Time: 01/03/2023  2:20 PM  Result Value Ref Range   WBC 10.0 4.0 - 10.5 K/uL   RBC 4.09 3.87 - 5.11 MIL/uL   Hemoglobin 12.6 12.0 - 15.0 g/dL   HCT 39.2 36.0 - 46.0 %   MCV 95.8 80.0 - 100.0 fL   MCH 30.8 26.0 - 34.0 pg   MCHC 32.1 30.0 - 36.0 g/dL   RDW 17.2 (H) 11.5 - 15.5 %   Platelets 115 (L) 150 - 400 K/uL   nRBC 0.0 0.0 - 0.2 %   Neutrophils Relative % 65 %   Neutro Abs 6.5 1.7 - 7.7 K/uL   Lymphocytes Relative 26 %   Lymphs Abs 2.6 0.7 - 4.0 K/uL   Monocytes Relative 6 %   Monocytes Absolute 0.6 0.1 - 1.0 K/uL   Eosinophils Relative 2 %   Eosinophils Absolute 0.2 0.0 - 0.5 K/uL   Basophils Relative 1 %   Basophils Absolute 0.1 0.0 - 0.1 K/uL   Immature Granulocytes 0 %   Abs Immature Granulocytes 0.03 0.00 - 0.07 K/uL    Comment: Performed at [facility], [place] [address], [place], [place] [address]    Chest Portable 1 View  Result Date: 01/13/2023 CLINICAL DATA:  Preop EXAM: PORTABLE CHEST 1 VIEW COMPARISON:  CXR 07/31/22 FINDINGS: No pleural effusion. No pneumothorax. Unchanged asymmetric elevation of the right hemidiaphragm. Unchanged cardiac and mediastinal contours. Compared to prior exam there is interval decrease in pulmonary edema. No displaced rib fractures. Visualized upper abdomen is unremarkable. IMPRESSION: 1. Interval decrease in pulmonary edema. 2. Unchanged asymmetric elevation of the right hemidiaphragm. Electronically Signed   By: Marin Roberts M.D.   On: 01/11/2023 15:01   DG Elbow Complete Left  Result Date: 01/05/2023 CLINICAL DATA:  Fall, rule out fracture EXAM: LEFT ELBOW - COMPLETE 3+ VIEW COMPARISON:  Study FINDINGS: No evidence of fracture of the ulna or humerus. The radial head is normal. No  joint effusion. IMPRESSION: No fracture or dislocation Electronically Signed  By: Suzy Bouchard M.D.   On: 01/10/2023 13:39   DG Hip Unilat W or Wo Pelvis 2-3 Views Left  Result Date: 12/31/2022 CLINICAL DATA:  Fall EXAM: DG HIP (WITH OR WITHOUT PELVIS) 2-3V LEFT COMPARISON:  Head CT 03/04/2021 FINDINGS: There is an acute mildly displaced fracture of the femoral neck with approximately 7 mm superolateral displacement of the distal fragment. Femoroacetabular alignment appears maintained. There is no acute fracture or dislocation of the right. There is mild degenerative change about both hips. The SI joints and symphysis pubis are intact. IMPRESSION: Acute mildly displaced fracture through the left femoral neck. Electronically Signed   By: Valetta Mole M.D.   On: 12/31/2022 13:38    Review of Systems  HENT:  Negative for ear discharge, ear pain, hearing loss and tinnitus.   Eyes:  Negative for photophobia and pain.  Respiratory:  Negative for cough and shortness of breath.   Cardiovascular:  Negative for chest pain.  Gastrointestinal:  Negative for abdominal pain, nausea and vomiting.  Genitourinary:  Negative for dysuria, flank pain, frequency and urgency.  Musculoskeletal:  Positive for arthralgias (Left hip and elbow). Negative for back pain, myalgias and neck pain.  Neurological:  Negative for dizziness and headaches.  Hematological:  Does not bruise/bleed easily.  Psychiatric/Behavioral:  The patient is not nervous/anxious.    Blood pressure (!) 142/86, pulse (!) 59, temperature 97.7 F (36.5 C), temperature source Oral, resp. rate 20, SpO2 92 %. Physical Exam Constitutional:      General: She is not in acute distress.    Appearance: She is well-developed. She is not diaphoretic.  HENT:     Head: Normocephalic and atraumatic.  Eyes:     General: No scleral icterus.       Right eye: No discharge.        Left eye: No discharge.     Conjunctiva/sclera: Conjunctivae normal.   Cardiovascular:     Rate and Rhythm: Normal rate and regular rhythm.  Pulmonary:     Effort: Pulmonary effort is normal. No respiratory distress.  Musculoskeletal:     Cervical back: Normal range of motion.     Comments: LLE No traumatic wounds, ecchymosis, or rash  Mod TTP hip  No knee or ankle effusion  Knee stable to varus/ valgus and anterior/posterior stress  Sens DPN, SPN, TN intact  Motor EHL, ext, flex, evers 5/5  DP 2+, PT 2+, No significant edema  Skin:    General: Skin is warm and dry.  Neurological:     Mental Status: She is alert.  Psychiatric:        Mood and Affect: Mood normal.        Behavior: Behavior normal.     Assessment/Plan: Left hip fx -- Plan THA tomorrow with Dr. Mardelle Matte. Please keep NPO after MN. Multiple medical problems including rheumatoid arthritis, COPD, hypothyroidism, HLD, and uterine prolapse -- per primary service    Lisette Abu, PA-C Orthopedic Surgery [phone] 01/15/2023, 3:36 PM

## 2023-01-06 NOTE — Anesthesia Procedure Notes (Addendum)
Anesthesia Regional Block: Peng block   Pre-Anesthetic Checklist: , timeout performed,  Correct Patient, Correct Site, Correct Laterality,  Correct Procedure, Correct Position, site marked,  Risks and benefits discussed,  Surgical consent,  Pre-op evaluation,  At surgeon's request and post-op pain management  Laterality: Left  Prep: chloraprep       Needles:  Injection technique: Single-shot  Needle Type: Echogenic Stimulator Needle     Needle Length: 5cm  Needle Gauge: 22     Additional Needles:   Procedures:,,,, ultrasound used (permanent image in chart),,    Narrative:  Start time: 01/14/2023 5:20 PM End time: 12/31/2022 5:25 PM Injection made incrementally with aspirations every 5 mL.  Performed by: Personally  Anesthesiologist: Janeece Riggers, MD  Additional Notes: Functioning IV was confirmed and monitors were applied.  A 99mm 22ga Arrow echogenic stimulator needle was used. Sterile prep and drape,hand hygiene and sterile gloves were used. Ultrasound guidance: relevant anatomy identified, needle position confirmed, local anesthetic spread visualized around nerve(s)., vascular puncture avoided.   Negative aspiration and negative test dose prior to incremental administration of local anesthetic.  See notes in anesthesia record.  Between 1245-8099 block was place under direct visualization with aspiration every 56ml and patient was conversant during the block.  At 1724 patient became apneic and was bagged with Ambu to ventilate without difficulty, and I called for anesthesia team assist.  Crash cart to room and prepared to intubate. Code was called.  No pulse present and CPR was started by myself with assistance pre-op staff. Called for intralipid bolus.  1730 continue CPR and pt intubated.  BSBL = and color change with CO2 indicator.  Tube secured. 1730-1801 Intralipid administered 10 doses of 51ml.  9 doses of epinephrine and 3 doses of Atropine.  Pt would respond to  epinephrine and regain pulses several times but would brady down necessitating CPR and eventually support with epinephrine drip.  Pt was transported to ICU @1806   Patient was made no CPR / or shock by ICU attending after consulting with POA.

## 2023-01-06 NOTE — Progress Notes (Signed)
Time of death 2337. Pronounced with this RN and Paramedic. Family at bedside

## 2023-01-06 NOTE — Progress Notes (Signed)
[facility] admitting physician note.  The patient had emesis after oxycodone.  She had already received ondansetron earlier.  Prochlorperazine 5 mg IVP x 1 along with pantoprazole 40 mg IVP x 1 given.  Ketorolac 15 mg IVP x 1 dose ordered for better pain control.  Tennis Must, MD.

## 2023-01-06 NOTE — ED Provider Notes (Signed)
[facility] DEPT Provider Note  CSN: [id number] Arrival date & time: 12/29/2022 1221  Chief Complaint(s) Hip Pain  HPI Lisa Vega is a 89 y.o. female with PMH rheumatoid arthritis, COPD, hypothyroidism, HLD, uterine prolapse who presents emergency department for evaluation of left hip pain after a fall.  Patient states that she was at her urogynecologist today when she had a mechanical fall and fell onto her left hip.  She currently endorses significant pain in swelling at the lateral femur on the left.  She also endorses some mild pain at the left elbow.  Denies head strike or loss of consciousness.   Past Medical History Past Medical History:  Diagnosis Date   Anxiety    Arthritis    RA   BPV (benign positional vertigo) 03/10/2018   Cancer ([place])    lung ca   Carotid stenosis, left 09/19/2018   COPD (chronic obstructive pulmonary disease) (HCC)    Depression    Dyspnea    with exertion   GERD (gastroesophageal reflux disease)    Headache syndrome 03/09/2019   Hypercholesteremia    Hypothyroidism    Memory difficulties 10/28/2015   Pneumonia     x3  last time 2017   Pre-diabetes    Rheumatoid arthritis(714.0)    Thyroid disease    Tremor 10/28/2015   Jaw tremor   Uterine prolapse    Vitamin D deficiency    Patient Active Problem List   Diagnosis Date Noted   Bilateral low back pain 12/11/2022   Sinusitis 02/09/2022   Acute respiratory failure ([place]) 01/29/2022   Pain in left shoulder 01/20/2022   Pain of left hip joint 01/12/2022   Stress incontinence 11/07/2021   Urinary frequency 10/20/2021   Arthralgia of lower leg 02/24/2021   On long term drug therapy 02/24/2021   Myalgia 02/24/2021   Anxiety state 02/24/2021   Diverticular disease of colon 02/24/2021   Dysphagia 02/24/2021   Essential tremor 02/24/2021   History of colonic polyps 02/24/2021   Mixed hyperlipidemia 02/24/2021   Vitamin B12 deficiency 02/24/2021   Vitamin D  deficiency 02/24/2021   Slow transit constipation 02/24/2021   COVID-19 01/28/2021   Headache, cervicogenic 01/03/2021   High risk medication use 12/06/2020   Rosacea 11/29/2020   Cervical spondylosis 04/18/2020   Primary osteoarthritis of first carpometacarpal joint of right hand 05/10/2019   Headache syndrome 03/09/2019   Carotid stenosis, left 09/19/2018   Osteoarthritis of left knee 05/12/2018   Benign positional vertigo 03/10/2018   History of lung cancer 11/29/2017   Difficulty sleeping 09/22/2017   Dysuria 08/06/2017   Diabetes ([place]) 05/13/2017   Fatigue 05/12/2017   CAP (community acquired pneumonia) 05/03/2017   Orthopnea 01/12/2017   GERD (gastroesophageal reflux disease) 11/09/2016   Osteoporosis 05/06/2016   Depression 05/06/2016   Hiatal hernia 05/06/2016   Hypothyroidism 05/06/2016   Tremor 10/28/2015   Memory difficulties 10/28/2015   COPD exacerbation ([place]) 01/08/2015   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 ([place]) 05/30/2014   Diverticulitis of colon without hemorrhage 08/07/2013   Prolapse of female pelvic organs 06/22/2013   Neck pain 03/10/2013   Vaginal atrophy 03/09/2013   Rectocele 03/09/2013   Cystocele 03/09/2013   Urinary incontinence 03/09/2013   COPD (chronic obstructive pulmonary disease) ([place]) 01/16/2011   Malignant neoplasm of bronchus and lung ([place]) 12/17/2010   Rheumatoid arthritis ([place]) 12/17/2010   Dyspnea on exertion 12/17/2010   Home Medication(s) Prior to Admission medications   Medication Sig Start Date End  Date Taking? Authorizing Provider  acetaminophen (TYLENOL) 325 MG tablet Take 650 mg by mouth every 6 (six) hours as needed for mild pain.    [provider]  albuterol (PROAIR HFA) 108 (90 Base) MCG/ACT inhaler Inhale 2 puffs into the lungs every 6 (six) hours as needed for wheezing or shortness of breath. 03/25/22   Martyn Ehrich, NP  ALPRAZolam Duanne Moron) 0.25 MG tablet TAKE 1 TABLET BY MOUTH EVERY DAY AS  NEEDED FOR ANXIETY 06/10/22   Binnie Rail, MD  aspirin EC 81 MG tablet Take 81 mg by mouth daily.    [provider]  B-D TB SYRINGE 1CC/27GX1/2" 27G X 1/2" 1 ML MISC For use with methotrexate injection weekly 08/26/21   Collier Salina, MD  budesonide-formoterol [facility]) 160-4.5 MCG/ACT inhaler Inhale 2 puffs into the lungs 2 (two) times daily. 01/04/23   Laurin Coder, MD  clidinium-chlordiazePOXIDE (LIBRAX) 5-2.5 MG capsule SMARTSIG:1 Capsule(s) By Mouth Every 12 Hours PRN 04/22/20   [provider]  estradiol (ESTRACE) 0.1 MG/GM vaginal cream Place 1 g vaginally 3 (three) times a week. 08/19/22   Chrzanowski, Wende Crease B, NP  famotidine (PEPCID) 40 MG tablet TAKE 1 TABLET DAILY 03/14/21   Binnie Rail, MD  folic acid (FOLVITE) 1 MG tablet Take 1 tablet (1 mg total) by mouth daily. 10/21/21   Rice, Resa Miner, MD  gabapentin (NEURONTIN) 100 MG capsule Take 1 capsule (100 mg total) by mouth 3 (three) times daily. 10/21/22   Suzzanne Cloud, NP  ibuprofen (ADVIL,MOTRIN) 200 MG tablet Take 200 mg by mouth every 6 (six) hours as needed for headache or mild pain.    [provider]  levothyroxine (SYNTHROID) 88 MCG tablet Take 1 tablet (88 mcg total) by mouth daily. 08/12/22   Binnie Rail, MD  losartan (COZAAR) 25 MG tablet Take 1 tablet (25 mg total) by mouth daily. 04/17/22 04/12/23  Donato Heinz, MD  meclizine (ANTIVERT) 25 MG tablet Take 0.5-1 tablets (12.5-25 mg total) by mouth 3 (three) times daily as needed for dizziness. 01/03/21   Binnie Rail, MD  Methotrexate Sodium (METHOTREXATE, PF,) 50 MG/2ML injection INJECT 0.7 MLS (17.5 MG TOTAL) INTO THE SKIN ONCE A WEEK. 11/02/22   Rice, Resa Miner, MD  mupirocin ointment (BACTROBAN) 2 % Place 1 application into the nose 2 (two) times daily. 01/17/19   Fontaine, Belinda Block, MD  omeprazole (PRILOSEC) 40 MG capsule Take 40 mg by mouth daily.    [provider]  [facility] DELICA LANCETS 11B MISC USE  TO CHECK SUGARS UP TO 4 TIMES A DAY 03/19/18   [provider]  ONETOUCH ULTRA test strip USE TO CHECK BLOOD SUGARS TWICE A DAY 04/08/22   Burns, [name], MD  Plecanatide (TRULANCE) 3 MG TABS Take 3 mg by mouth as needed. (Samples from GI) 08/29/20   [provider]  prednisoLONE acetate (PRED FORTE) 1 % ophthalmic suspension 1 drop 2 (two) times daily. 08/21/22   [provider]  rosuvastatin (CRESTOR) 5 MG tablet Take 1 tablet (5 mg total) by mouth daily. 03/10/22 08/19/22  Donato Heinz, MD  sertraline (ZOLOFT) 100 MG tablet TAKE 1 TABLET BY MOUTH EVERY DAY 07/06/22   Binnie Rail, MD  SPIRIVA RESPIMAT 2.5 MCG/ACT AERS USE 2 INHALATIONS ORALLY   DAILY Patient taking differently: Inhale 2 puffs into the lungs daily as needed. 09/02/21   Olalere, Ernesto Rutherford, MD  traMADol (ULTRAM) 50 MG tablet Take 50 mg  by mouth 3 (three) times daily as needed. 01/12/22   [provider]  triamcinolone ointment (KENALOG) 0.1 % Apply 1 Application topically 2 (two) times daily as needed. 08/19/22   Kerry Dory, NP                                                                                                                                    Past Surgical History Past Surgical History:  Procedure Laterality Date   ANGIOPLASTY Left 10/10/2018   Procedure: ANGIOPLASTY using 1cm x 14cm Xenosure Patch;  Surgeon: Marty Heck, MD;  Location: [facility];  Service: Vascular;  Laterality: Left;   APPENDECTOMY     COLONOSCOPY W/ POLYPECTOMY     ENDARTERECTOMY Left 10/10/2018   Procedure: ENDARTERECTOMY CAROTID;  Surgeon: Marty Heck, MD;  Location: [facility] OR;  Service: Vascular;  Laterality: Left;   EYE SURGERY Bilateral    cataract   FOOT SURGERY Bilateral    arthritis   Hands  Bilateral    Arthritis   HEMORRHOID SURGERY     right lobectomy  10/2004   TONSILLECTOMY AND ADENOIDECTOMY     Family History Family History  Problem Relation Age of Onset    Emphysema Mother    Asthma Mother    Lung cancer Father    Pancreatic cancer Father    Bone cancer Father    Heart disease Brother        x1   Diabetes Brother    Hypertension Brother    Lupus Brother    Heart disease Sister        x2   Diabetes Sister    Hypertension Sister    Diabetes Sister    Hypertension Sister    Diabetes Brother    Hypertension Brother    Stroke Maternal Grandfather    Hypertension Son    Tremor Son     Social History Social History   Tobacco Use   Smoking status: Former    Packs/day: 1.00    Years: 20.00    Total pack years: 20.00    Types: Cigarettes    Quit date: 11/10/2004    Years since quitting: 18.1    Passive exposure: Past   Smokeless tobacco: Never  Vaping Use   Vaping Use: Never used  Substance Use Topics   Alcohol use: Yes    Alcohol/week: 2.0 standard drinks of alcohol    Types: 1 Glasses of wine, 1 Cans of beer per week   Drug use: No   Allergies Codeine, Meperidine hcl, Demerol, Escitalopram, Meperidine hcl, Other, and Sulfa antibiotics  Review of Systems Review of Systems  Musculoskeletal:  Positive for arthralgias and myalgias.    Physical Exam Vital Signs  I have reviewed the triage vital signs BP 122/86 (BP Location: Left Arm)   Pulse 60   Temp 97.7 F (36.5 C) (Oral)   Resp 16   SpO2 97%  Physical Exam Vitals and nursing note reviewed.  Constitutional:      General: She is not in acute distress.    Appearance: She is well-developed.  HENT:     Head: Normocephalic and atraumatic.  Eyes:     Conjunctiva/sclera: Conjunctivae normal.  Cardiovascular:     Rate and Rhythm: Normal rate and regular rhythm.     Heart sounds: No murmur heard. Pulmonary:     Effort: Pulmonary effort is normal. No respiratory distress.     Breath sounds: Normal breath sounds.  Abdominal:     Palpations: Abdomen is soft.     Tenderness: There is no abdominal tenderness.  Musculoskeletal:        General: Swelling,  tenderness and deformity present.     Cervical back: Neck supple.  Skin:    General: Skin is warm and dry.     Capillary Refill: Capillary refill takes less than 2 seconds.  Neurological:     Mental Status: She is alert.  Psychiatric:        Mood and Affect: Mood normal.     ED Results and Treatments Labs (all labs ordered are listed, but only abnormal results are displayed) Labs Reviewed - No data to display                                                                                                                        Radiology No results found.  Pertinent labs & imaging results that were available during my care of the patient were reviewed by me and considered in my medical decision making (see MDM for details).  Medications Ordered in ED Medications - No data to display                                                                                                                                   Procedures Procedures  (including critical care time)  Medical Decision Making / ED Course   This patient presents to the ED for concern of hip pain, this involves an extensive number of treatment options, and is a complaint that carries with it a high risk of complications and morbidity.  The differential diagnosis includes fracture, dislocation, ligamentous injury, hematoma, contusion  MDM: Patient seen emerged part for evaluation of hip pain after fall.  Physical exam with tenderness and swelling at the hip on the left as well as some bruising at the  elbow on the left.  X-ray imaging concerning for femoral neck fracture.  I consulted Dr. Mardelle Matte of orthopedics who will come evaluate the patient and schedule the patient for surgery.  Patient then admitted to the hospital service.   Additional history obtained: -Additional history obtained from daughter -External records from outside source obtained and reviewed including: Chart review including previous notes, labs,  imaging, consultation notes   Lab Tests: -I ordered, reviewed, and interpreted labs.   The pertinent results include:   Labs Reviewed - No data to display    Imaging Studies ordered: I ordered imaging studies including x-ray elbow, x-ray hip I independently visualized and interpreted imaging. I agree with the radiologist interpretation   Medicines ordered and prescription drug management: No orders of the defined types were placed in this encounter.   -I have reviewed the patients home medicines and have made adjustments as needed  Critical interventions none  Consultations Obtained: I requested consultation with the orthopedic surgeon Dr. Mardelle Matte,  and discussed lab and imaging findings as well as pertinent plan - they recommend: Hospitalist admission for surgery   Cardiac Monitoring: The patient was maintained on a cardiac monitor.  I personally viewed and interpreted the cardiac monitored which showed an underlying rhythm of: NSR  Social Determinants of Health:  Factors impacting patients care include: none   Reevaluation: After the interventions noted above, I reevaluated the patient and found that they have :stayed the same  Co morbidities that complicate the patient evaluation  Past Medical History:  Diagnosis Date   Anxiety    Arthritis    RA   BPV (benign positional vertigo) 03/10/2018   Cancer ([place])    lung ca   Carotid stenosis, left 09/19/2018   COPD (chronic obstructive pulmonary disease) ([place])    Depression    Dyspnea    with exertion   GERD (gastroesophageal reflux disease)    Headache syndrome 03/09/2019   Hypercholesteremia    Hypothyroidism    Memory difficulties 10/28/2015   Pneumonia     x3  last time 2017   Pre-diabetes    Rheumatoid arthritis(714.0)    Thyroid disease    Tremor 10/28/2015   Jaw tremor   Uterine prolapse    Vitamin D deficiency       Dispostion: I considered admission for this patient, and due to femoral neck  fracture patient will require hospital admission     Final Clinical Impression(s) / ED Diagnoses Final diagnoses:  None     @PCDICTATION @    Teressa Lower, MD 01/22/2023 1719

## 2023-01-06 NOTE — Progress Notes (Signed)
Chaplain engaged in an initial visit with Clayton's daughter and son-in-law.  Chaplain provided space for learning about [place] through daughter's narrative.  Daughter described Ramiya as being strong and God-fearing. Oaklyn is originally from [place] and has roots in [place].  She has been in [place] for over 30 years.  She loves her grandchildren.  In the last six years, she lived with her daughter and son-in-law with them building a suite on their home to take care of her.  Her son-in-law voiced that Gambier loved to take walks around [place] and frequent diners.  She liked coffee and pancakes.  Britaney was also described as being healthy, and it has left her daughter in shock concerning the state she is in now.    Chaplain offered prayer over [place], upholding her story and relationship with God.  Chaplain also offered daughter grief support as she wondered about next-steps concerning telling family.  Chaplain affirmed her need to grieve and "not have it all together."   Chaplain provided presence, listening, and support.   Bea Graff, MDiv  01/13/2023 2010  Spiritual Encounters  Type of Visit Initial  Care provided to: Pt and family  Conversation partners present during encounter Nurse  Reason for visit End-of-life  OnCall Visit Yes  Interventions  Spiritual Care Interventions Made Established relationship of care and support;Narrative/life review;Bereavement/grief support;Prayer;Supported grief process  Intervention Outcomes  Outcomes Connection to spiritual care;Awareness of support

## 2023-01-06 NOTE — ED Provider Notes (Signed)
Department of Emergency Medicine CODE BLUE CONSULTATION    Code Blue CONSULT NOTE  Chief Complaint: Cardiac arrest/unresponsive   Level V Caveat: Unresponsive  History of present illness: I was contacted by the hospital for a CODE BLUE cardiac arrest upstairs and presented to the patient's bedside.  Patient just received bupivacaine for nerve block and went into cardiac arrest.   ROS: Unable to obtain, Level V caveat  Scheduled Meds:  fentaNYL (SUBLIMAZE) injection  50-100 mcg Intravenous Once   Continuous Infusions:  sodium chloride     epinephrine 20 mcg/min (01/23/2023 2050)   PRN Meds:.acetaminophen **OR** acetaminophen, albuterol, fentaNYL (SUBLIMAZE) injection, glycopyrrolate **OR** glycopyrrolate **OR** glycopyrrolate, LORazepam, morphine injection, ondansetron **OR** ondansetron (ZOFRAN) IV, polyvinyl alcohol Past Medical History:  Diagnosis Date   Anxiety    Arthritis    RA   BPV (benign positional vertigo) 03/10/2018   Cancer ([place])    lung ca   Carotid stenosis, left 09/19/2018   COPD (chronic obstructive pulmonary disease) (HCC)    Depression    Dyspnea    with exertion   GERD (gastroesophageal reflux disease)    Headache syndrome 03/09/2019   Hypercholesteremia    Hypothyroidism    Memory difficulties 10/28/2015   Pneumonia     x3  last time 2017   Pre-diabetes    Rheumatoid arthritis(714.0)    Thyroid disease    Tremor 10/28/2015   Jaw tremor   Uterine prolapse    Vitamin D deficiency    Past Surgical History:  Procedure Laterality Date   ANGIOPLASTY Left 10/10/2018   Procedure: ANGIOPLASTY using 1cm x 14cm Xenosure Patch;  Surgeon: Marty Heck, MD;  Location: [facility] OR;  Service: Vascular;  Laterality: Left;   APPENDECTOMY     COLONOSCOPY W/ POLYPECTOMY     ENDARTERECTOMY Left 10/10/2018   Procedure: ENDARTERECTOMY CAROTID;  Surgeon: Marty Heck, MD;  Location: [facility] OR;  Service: Vascular;  Laterality: Left;   EYE SURGERY  Bilateral    cataract   FOOT SURGERY Bilateral    arthritis   Hands  Bilateral    Arthritis   HEMORRHOID SURGERY     right lobectomy  10/2004   TONSILLECTOMY AND ADENOIDECTOMY     Social History   Socioeconomic History   Marital status: Widowed    Spouse name: Not on file   Number of children: 3   Years of education: LPN   Highest education level: Not on file  Occupational History   Occupation: retired    Fish farm manager: RETIRED    Comment: LPN at [employer] Use   Smoking status: Former    Packs/day: 1.00    Years: 20.00    Total pack years: 20.00    Types: Cigarettes    Quit date: 11/10/2004    Years since quitting: 18.1    Passive exposure: Past   Smokeless tobacco: Never  Vaping Use   Vaping Use: Never used  Substance and Sexual Activity   Alcohol use: Yes    Alcohol/week: 2.0 standard drinks of alcohol    Types: 1 Glasses of wine, 1 Cans of beer per week   Drug use: No   Sexual activity: Not Currently  Other Topics Concern   Not on file  Social History Narrative   09/09/21 lives with dgtr Lovey Newcomer; widowed   Patient drinks about 3 cups of coffee daily.   Patient is right handed.    Retired Corporate treasurer at [employer]  Determinants of Health   Financial Resource Strain: Low Risk  (09/12/2021)   Overall Financial Resource Strain (CARDIA)    Difficulty of Paying Living Expenses: Not hard at all  Food Insecurity: No Food Insecurity (09/12/2021)   Hunger Vital Sign    Worried About Running Out of Food in the Last Year: Never true    Ran Out of Food in the Last Year: Never true  Transportation Needs: No Transportation Needs (09/12/2021)   PRAPARE - Hydrologist (Medical): No    Lack of Transportation (Non-Medical): No  Physical Activity: Inactive (09/12/2021)   Exercise Vital Sign    Days of Exercise per Week: 0 days    Minutes of Exercise per Session: 0 min  Stress: No Stress Concern Present (09/12/2021)    [place]    Feeling of Stress : Not at all  Social Connections: Socially Isolated (09/12/2021)   Social Connection and Isolation Panel [NHANES]    Frequency of Communication with Friends and Family: More than three times a week    Frequency of Social Gatherings with Friends and Family: Once a week    Attends Religious Services: Never    Marine scientist or Organizations: No    Attends Archivist Meetings: Never    Marital Status: Widowed  Intimate Partner Violence: Not At Risk (09/12/2021)   Humiliation, Afraid, Rape, and Kick questionnaire    Fear of Current or Ex-Partner: No    Emotionally Abused: No    Physically Abused: No    Sexually Abused: No   Allergies  Allergen Reactions   Codeine Other (See Comments)    Hallucinations   Meperidine Hcl Nausea And Vomiting    Severe GI upset   Demerol Other (See Comments)   Escitalopram Other (See Comments)    Other reaction(s): memory issues   Meperidine Hcl Other (See Comments)   Other Other (See Comments)   Sulfa Antibiotics Rash    Last set of Vital Signs (not current) Vitals:   01/22/2023 2000 01/17/2023 2100  BP: (!) 105/49   Pulse: (!) 38 (!) 36  Resp: 16 19  Temp:    SpO2: 97% 100%      Physical Exam  Gen: unresponsive Cardiovascular: pulseless  Resp: apneic. Breath sounds equal bilaterally with bagging  Abd: nondistended  Neuro: GCS 3, unresponsive to pain  HEENT: No blood in posterior pharynx, gag reflex absent  Neck: No crepitus  Musculoskeletal: No deformity  Skin: warm  Procedures  INTUBATION   CRITICAL CARE Performed by: Wandra Arthurs Total critical care time: 50 min  Critical care time was exclusive of separately billable procedures and treating other patients. Critical care was necessary to treat or prevent imminent or life-threatening deterioration. Critical care was time spent personally by me on the following  activities: development of treatment plan with patient and/or surrogate as well as nursing, discussions with consultants, evaluation of patient's response to treatment, examination of patient, obtaining history from patient or surrogate, ordering and performing treatments and interventions, ordering and review of laboratory studies, ordering and review of radiographic studies, pulse oximetry and re-evaluation of patient's condition.  Cardiopulmonary Resuscitation (CPR) Procedure Note  Directed/Performed by: Wandra Arthurs I personally directed ancillary staff and/or performed CPR in an effort to regain return of spontaneous circulation and to maintain cardiac, neuro and systemic perfusion.   CRITICAL CARE Performed by: Wandra Arthurs   Total critical care time:  45 minutes  Critical care time was exclusive of separately billable procedures and treating other patients.  Critical care was necessary to treat or prevent imminent or life-threatening deterioration.  Critical care was time spent personally by me on the following activities: development of treatment plan with patient and/or surrogate as well as nursing, discussions with consultants, evaluation of patient's response to treatment, examination of patient, obtaining history from patient or surrogate, ordering and performing treatments and interventions, ordering and review of laboratory studies, ordering and review of radiographic studies, pulse oximetry and re-evaluation of patient's condition.   Medical Decision making  I was called into preop area when she lost pulses.  Patient had a hip fracture and was receiving bupivacaine nerve block and then subsequently coded.  Anesthesia is at bedside and intubated patient.  Anesthesia ordered Intralipid infusions to reverse bupivacaine.  ACLS was initiated prior to my arrival.  Total downtime was about 20 minutes.  After we regain ROSC, patient was maintained on epinephrine drip.  Patient did  bradycardia down several times and patient received several doses of atropine.  Do not know if this is a side effect of nerve block or patient has underlying cardiac disease or went to arrhythmia.  Dr. Verlee Monte from critical care came to bedside and assumed care.  Patient subsequently moved to the ICU.  Assessment and Plan  See above     Drenda Freeze, MD 01/20/2023 2212

## 2023-01-06 NOTE — ED Triage Notes (Signed)
BIBA from obgyn after falling while getting dressed. Complaints of left hip pain. Shortening and rotation noted.  Denies blood thinner usage, denies hitting head, and denies LOC

## 2023-01-06 NOTE — Addendum Note (Signed)
Addended byMarny Lowenstein on: 01/17/2023 01:41 PM   Modules accepted: Orders

## 2023-01-07 ENCOUNTER — Telehealth: Payer: Self-pay | Admitting: Pulmonary Disease

## 2023-01-07 ENCOUNTER — Ambulatory Visit: Payer: Medicare Other | Admitting: Neurology

## 2023-01-07 SURGERY — ARTHROPLASTY, HIP, TOTAL,POSTERIOR APPROACH
Anesthesia: Choice | Site: Hip | Laterality: Left

## 2023-01-09 LAB — URINALYSIS, COMPLETE W/RFL CULTURE
Bilirubin Urine: NEGATIVE
Glucose, UA: NEGATIVE
Hyaline Cast: NONE SEEN /LPF
Ketones, ur: NEGATIVE
Nitrites, Initial: NEGATIVE
Specific Gravity, Urine: 1.015 (ref 1.001–1.035)
WBC, UA: >=60 /HPF — AB (ref 0–5)
pH: 5.5 (ref 5.0–8.0)

## 2023-01-09 LAB — URINE CULTURE
MICRO NUMBER:: 14413043
SPECIMEN QUALITY:: ADEQUATE

## 2023-01-09 LAB — CULTURE INDICATED

## 2023-01-28 NOTE — Discharge Summary (Signed)
DEATH SUMMARY   Patient Details  Name: Lisa Vega MRN: [id number] DOB: Apr 02, 1932  Admission/Discharge Information   Admit Date:  02-05-2023  Date of Death: Date of Death: 2023-02-05  Time of Death: Time of Death: 06/09/36  Length of Stay: 1  Referring Physician: Pincus Sanes, MD   Reason(s) for Hospitalization  PEA arrest L femoral neck fracture   Diagnoses  Preliminary cause of death:  Secondary Diagnoses (including complications and co-morbidities):  Principal Problem:   Closed left hip fracture, initial encounter [facility]) Active Problems:   Rheumatoid arthritis (HCC)   COPD (chronic obstructive pulmonary disease) (HCC)   Osteoporosis   Depression   Hypothyroidism   GERD (gastroesophageal reflux disease)   Mixed hyperlipidemia   Hypertensive urgency   Acute UTI (urinary tract infection)   Brief Hospital Course (including significant findings, care, treatment, and services provided and events leading to death)  Lisa Vega is a 89 y.o. year old female who had possible local anesthetic systemic toxicity and cardiac arrest. ROSC after 25 minutes but required epi gtt for shock and bradycardia. Rapid goals of care discussion with daughter and decision made DNR/comfort shortly after admission.     Pertinent Labs and Studies  Significant Diagnostic Studies Chest Portable 1 View  Result Date: 02/05/2023 CLINICAL DATA:  Preop EXAM: PORTABLE CHEST 1 VIEW COMPARISON:  CXR 07/31/22 FINDINGS: No pleural effusion. No pneumothorax. Unchanged asymmetric elevation of the right hemidiaphragm. Unchanged cardiac and mediastinal contours. Compared to prior exam there is interval decrease in pulmonary edema. No displaced rib fractures. Visualized upper abdomen is unremarkable. IMPRESSION: 1. Interval decrease in pulmonary edema. 2. Unchanged asymmetric elevation of the right hemidiaphragm. Electronically Signed   By: Lorenza Cambridge M.D.   On: 02-05-23 15:01   DG Elbow Complete  Left  Result Date: 02/05/2023 CLINICAL DATA:  Fall, rule out fracture EXAM: LEFT ELBOW - COMPLETE 3+ VIEW COMPARISON:  Study FINDINGS: No evidence of fracture of the ulna or humerus. The radial head is normal. No joint effusion. IMPRESSION: No fracture or dislocation Electronically Signed   By: Genevive Bi M.D.   On: Feb 05, 2023 13:39   DG Hip Unilat W or Wo Pelvis 2-3 Views Left  Result Date: 02/05/2023 CLINICAL DATA:  Fall EXAM: DG HIP (WITH OR WITHOUT PELVIS) 2-3V LEFT COMPARISON:  Head CT 03/04/2021 FINDINGS: There is an acute mildly displaced fracture of the femoral neck with approximately 7 mm superolateral displacement of the distal fragment. Femoroacetabular alignment appears maintained. There is no acute fracture or dislocation of the right. There is mild degenerative change about both hips. The SI joints and symphysis pubis are intact. IMPRESSION: Acute mildly displaced fracture through the left femoral neck. Electronically Signed   By: Lesia Hausen M.D.   On: 02/05/2023 13:38    Microbiology No results found for this or any previous visit (from the past 240 hour(s)).  Lab Basic Metabolic Panel: No results for input(s): "NA", "K", "CL", "CO2", "GLUCOSE", "BUN", "CREATININE", "CALCIUM", "MG", "PHOS" in the last 168 hours. Liver Function Tests: No results for input(s): "AST", "ALT", "ALKPHOS", "BILITOT", "PROT", "ALBUMIN" in the last 168 hours. No results for input(s): "LIPASE", "AMYLASE" in the last 168 hours. No results for input(s): "AMMONIA" in the last 168 hours. CBC: No results for input(s): "WBC", "NEUTROABS", "HGB", "HCT", "MCV", "PLT" in the last 168 hours. Cardiac Enzymes: No results for input(s): "CKTOTAL", "CKMB", "CKMBINDEX", "TROPONINI" in the last 168 hours. Sepsis Labs: No results for input(s): "PROCALCITON", "WBC", "LATICACIDVEN" in the last  168 hours.  Procedures/Operations  See Epic for details   Omar Person 01/18/2023, 5:36 PM

## 2023-01-28 NOTE — Telephone Encounter (Signed)
Called and spoke to [place] and did clarify with her that patient had expired and we are not renewing supplies. Nothing further needed

## 2023-01-28 DEATH — deceased

## 2023-02-25 ENCOUNTER — Ambulatory Visit: Payer: Medicare Other | Admitting: Radiology

## 2023-03-15 ENCOUNTER — Ambulatory Visit: Payer: Medicare Other | Admitting: Internal Medicine

## 2023-03-25 ENCOUNTER — Ambulatory Visit: Payer: Medicare Other | Admitting: Podiatry

## 2023-04-25 IMAGING — RF DG ESOPHAGUS
10 of 11 series · 14 of 24 positions shown · non-contrast
Comparison: Esophagram 07/04/2021.  Chest CT 09/26/2020.

CLINICAL DATA: Dysphagia, unspecified type. History of lung cancer.

EXAM:
ESOPHOGRAM/BARIUM SWALLOW
TECHNIQUE: Single contrast examination was performed using  thin barium.
FLUOROSCOPY:
Radiation Exposure Index (as provided by the fluoroscopic device):
9.77 mGy K[place]

[Series 1: sequence · 2 of 22 frames shown (1 of 7)]
[frame 4/22]
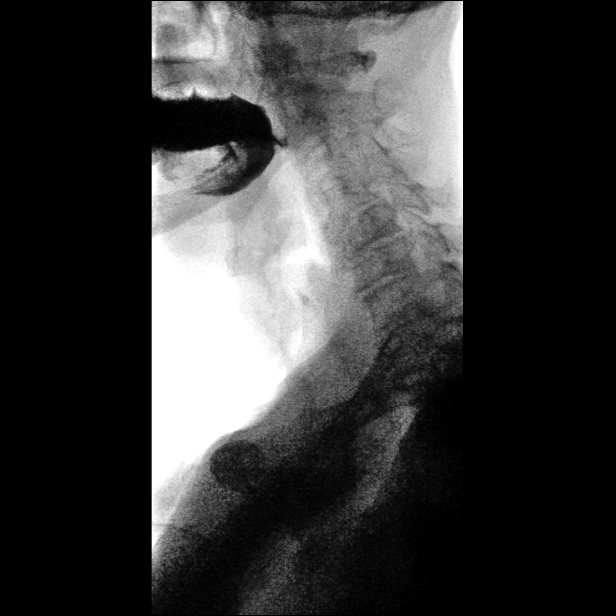
[frame 19/22]
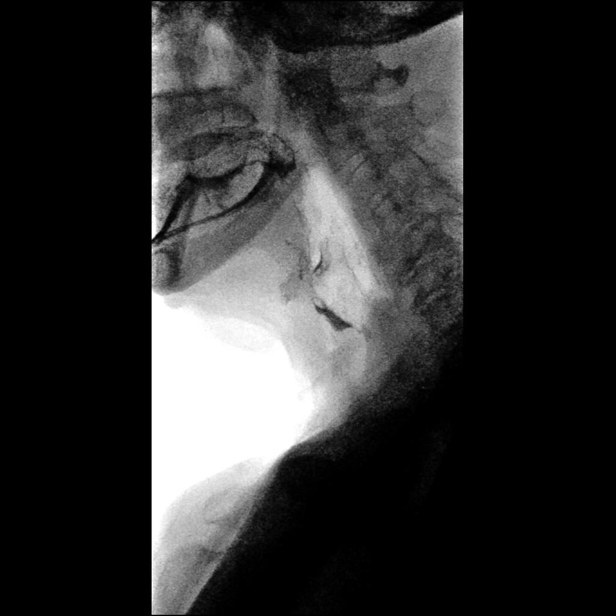

[Series 2: sequence · 1 of 17 frames shown (2 of 7)]
[frame 15/17]
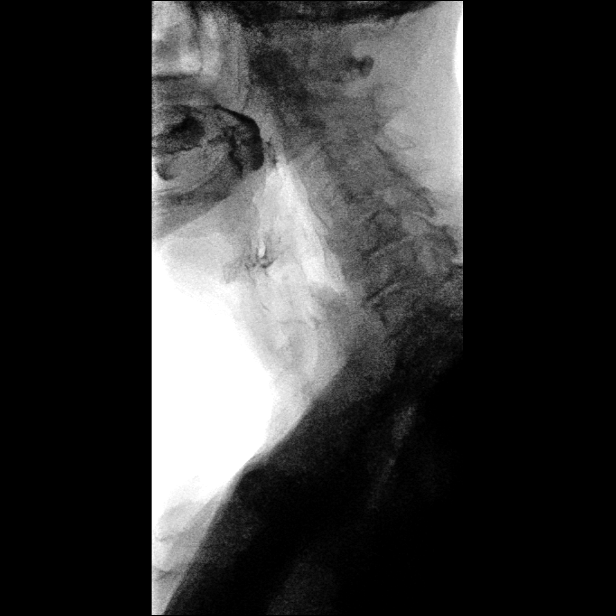

[Series 3: sequence · 1 of 21 frames shown (3 of 7)]
[frame 11/21]
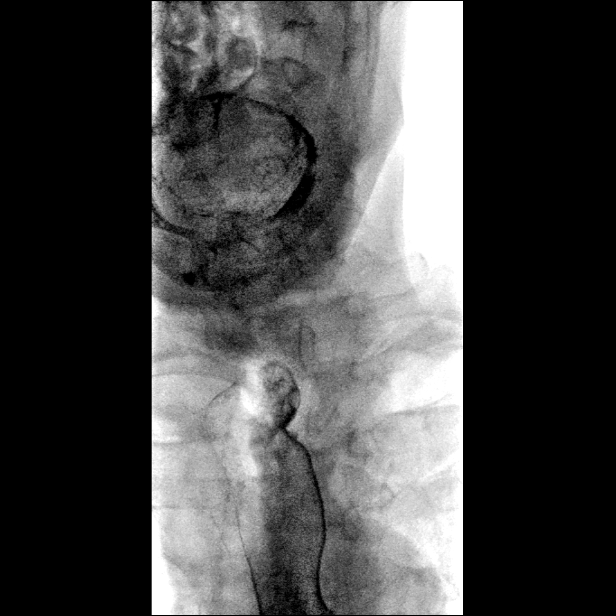

[Series 4: sequence · 2 of 25 frames shown (4 of 7)]
[frame 4/25]
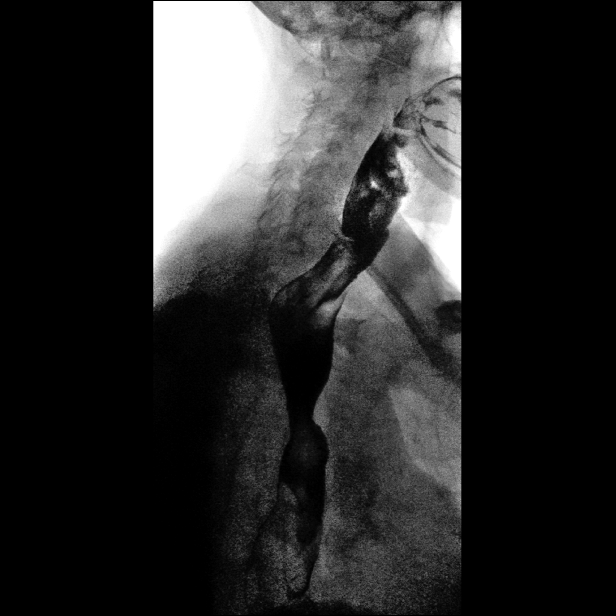
[frame 22/25]
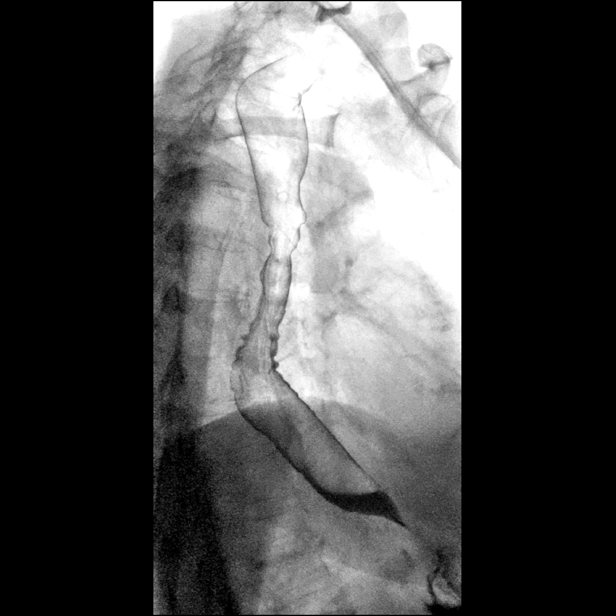

[Series 5: one shot · 1 of 3 slices shown (1 of 3)]
[im 3/3]
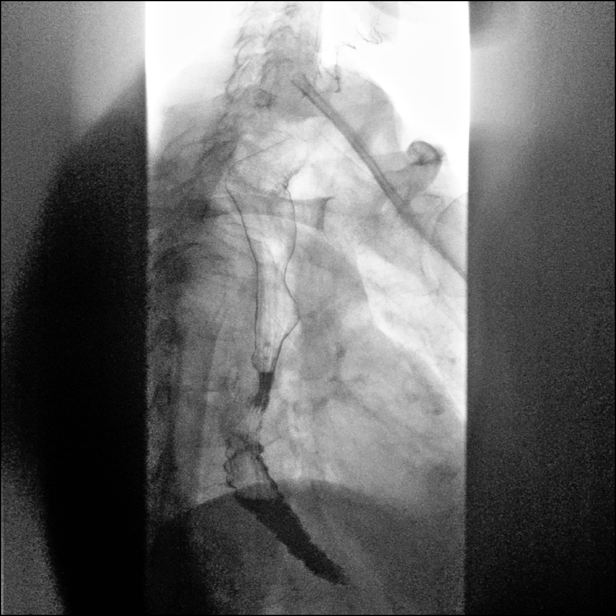

[Series 6: sequence · 1 of 8 frames shown (5 of 7)]
[frame 5/8]
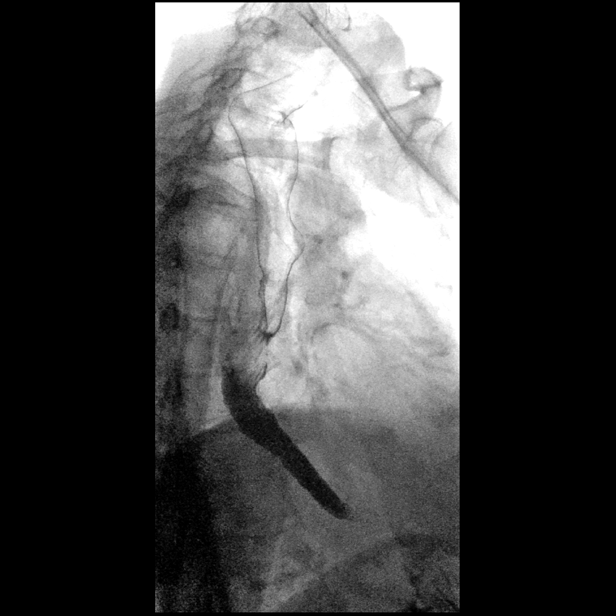

[Series 7: sequence · 1 of 36 frames shown (6 of 7)]
[frame 12/36]
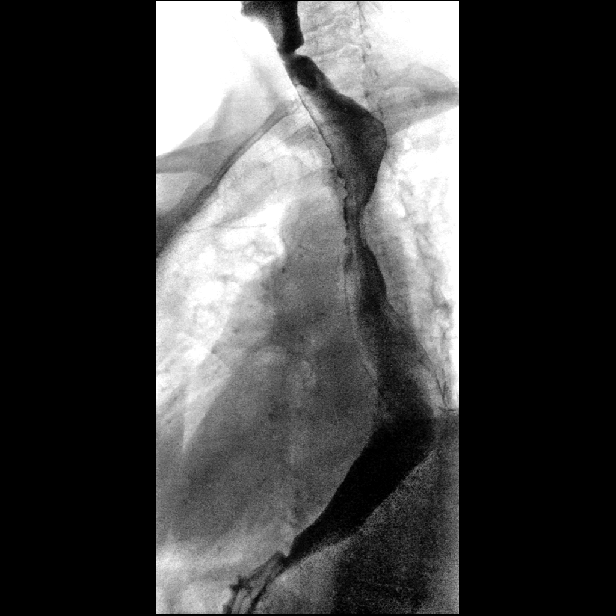

[Series 8: one shot · 1 of 1 slices shown (2 of 3)]
[im 1/1]
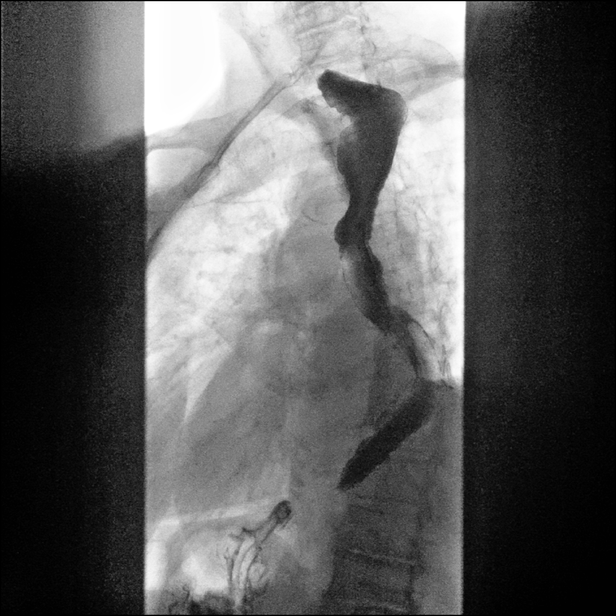

[Series 10: sequence · 2 of 23 frames shown (7 of 7)]
[frame 4/23]
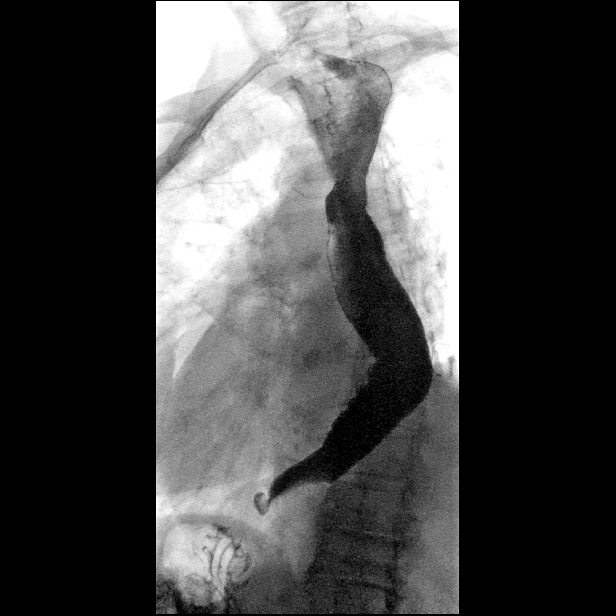
[frame 12/23]
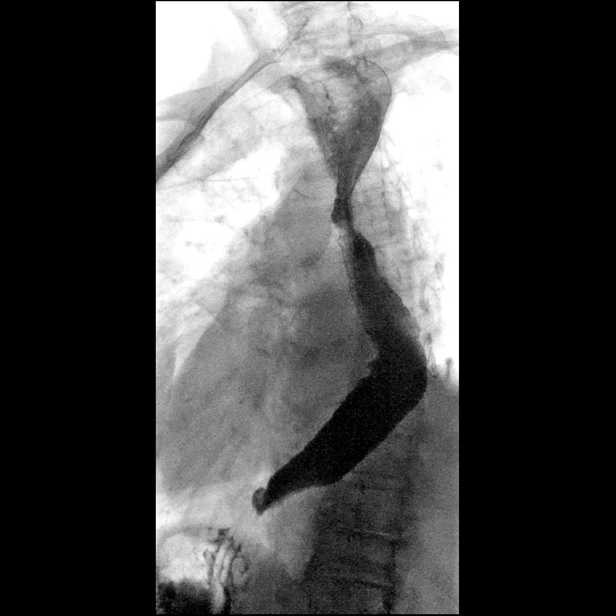

[Series 11: one shot · 2 of 5 slices shown (3 of 3)]
[im 2/5]
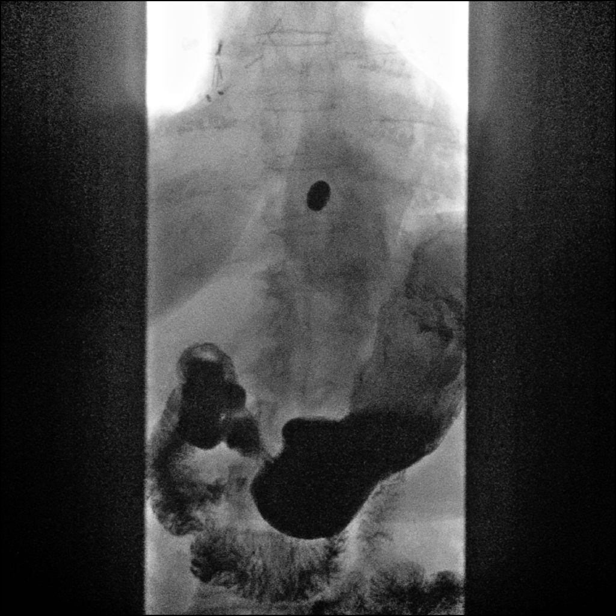
[im 5/5]
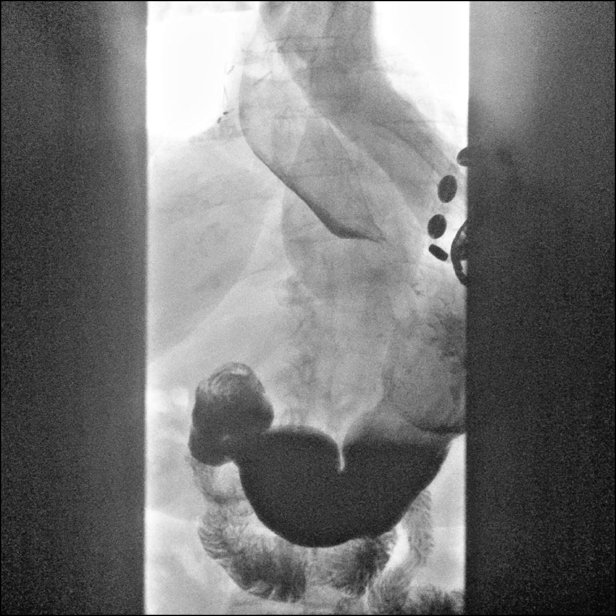

[14 of 24 positions shown; findings below may reference images not displayed]

FINDINGS: The patient swallowed the barium without difficulty. Rapid sequence
imaging of the pharynx in the AP and lateral projections
demonstrates no laryngeal penetration or focal mucosal abnormality.
Posterior indentation on the cervical esophagus by a prominent
cricopharyngeus muscle appears unchanged.

There is stable mild esophageal dysmotility with a decreased primary
stripping wave and mild tertiary contractions. There is stable mild
dilatation of the esophagus and deviation to the right in the upper
chest related to right apical scarring. No evidence of mucosal
lesion, stricture or ulceration. There is no significant hiatal
hernia.

No significant reflux identified with the water siphon test. A 13 mm
barium tablet was administered and passed without significant delay
into the stomach.
IMPRESSION: 1. Stable appearance of the esophagus without acute abnormality.
2. Stable mild esophageal dilatation and mild dysmotility.

## 2023-10-21 ENCOUNTER — Ambulatory Visit: Payer: Medicare Other | Admitting: Family Medicine
